# Patient Record
Sex: Male | Born: 1960
Health system: Southern US, Community
[De-identification: ages and names within clinical notes are randomized; demographics above are authoritative.]

## PROBLEM LIST (undated history)

## (undated) DIAGNOSIS — F419 Anxiety disorder, unspecified: Secondary | ICD-10-CM

## (undated) DIAGNOSIS — N200 Calculus of kidney: Secondary | ICD-10-CM

## (undated) DIAGNOSIS — K429 Umbilical hernia without obstruction or gangrene: Secondary | ICD-10-CM

## (undated) DIAGNOSIS — E039 Hypothyroidism, unspecified: Secondary | ICD-10-CM

## (undated) DIAGNOSIS — M1990 Unspecified osteoarthritis, unspecified site: Secondary | ICD-10-CM

## (undated) DIAGNOSIS — I10 Essential (primary) hypertension: Secondary | ICD-10-CM

## (undated) DIAGNOSIS — I2510 Atherosclerotic heart disease of native coronary artery without angina pectoris: Secondary | ICD-10-CM

## (undated) DIAGNOSIS — E785 Hyperlipidemia, unspecified: Secondary | ICD-10-CM

## (undated) HISTORY — DX: Hypothyroidism, unspecified: E03.9

## (undated) HISTORY — DX: Calculus of kidney: N20.0

## (undated) HISTORY — PX: CORONARY ANGIOPLASTY WITH STENT PLACEMENT: SHX49

## (undated) HISTORY — PX: TONSILLECTOMY: SUR1361

## (undated) HISTORY — PX: FINGER SURGERY: SHX640

## (undated) HISTORY — DX: Essential (primary) hypertension: I10

## (undated) HISTORY — DX: Anxiety disorder, unspecified: F41.9

## (undated) HISTORY — DX: Hyperlipidemia, unspecified: E78.5

## (undated) HISTORY — DX: Unspecified osteoarthritis, unspecified site: M19.90

## (undated) HISTORY — DX: Atherosclerotic heart disease of native coronary artery without angina pectoris: I25.10

---

## 2002-06-24 ENCOUNTER — Encounter: Payer: Self-pay | Admitting: Emergency Medicine

## 2002-06-24 ENCOUNTER — Emergency Department (HOSPITAL_COMMUNITY): Admission: EM | Admit: 2002-06-24 | Discharge: 2002-06-24 | Payer: Self-pay | Admitting: Emergency Medicine

## 2005-02-12 ENCOUNTER — Ambulatory Visit: Payer: Self-pay | Admitting: Family Medicine

## 2005-08-15 ENCOUNTER — Ambulatory Visit: Payer: Self-pay | Admitting: Family Medicine

## 2005-09-06 ENCOUNTER — Ambulatory Visit: Payer: Self-pay | Admitting: Family Medicine

## 2005-12-11 ENCOUNTER — Ambulatory Visit: Payer: Self-pay | Admitting: Family Medicine

## 2005-12-21 ENCOUNTER — Ambulatory Visit: Payer: Self-pay | Admitting: Family Medicine

## 2006-03-29 ENCOUNTER — Ambulatory Visit: Payer: Self-pay | Admitting: Family Medicine

## 2006-04-06 ENCOUNTER — Emergency Department (HOSPITAL_COMMUNITY): Admission: EM | Admit: 2006-04-06 | Discharge: 2006-04-06 | Payer: Self-pay | Admitting: Emergency Medicine

## 2006-04-19 ENCOUNTER — Ambulatory Visit: Payer: Self-pay | Admitting: Family Medicine

## 2006-12-15 ENCOUNTER — Emergency Department (HOSPITAL_COMMUNITY): Admission: EM | Admit: 2006-12-15 | Discharge: 2006-12-16 | Payer: Self-pay | Admitting: Emergency Medicine

## 2007-04-16 ENCOUNTER — Ambulatory Visit: Payer: Self-pay | Admitting: Family Medicine

## 2007-04-16 LAB — CONVERTED CEMR LAB
Blood in Urine, dipstick: NEGATIVE
Ketones, urine, test strip: NEGATIVE
Nitrite: NEGATIVE
Specific Gravity, Urine: >=1.03
Urobilinogen, UA: NEGATIVE
WBC Urine, dipstick: NEGATIVE
pH: 5

## 2007-04-21 LAB — CONVERTED CEMR LAB
ALT: 76 units/L — ABNORMAL HIGH (ref 0–53)
AST: 57 units/L — ABNORMAL HIGH (ref 0–37)
Albumin: 3.8 g/dL (ref 3.5–5.2)
Alkaline Phosphatase: 78 units/L (ref 39–117)
BUN: 10 mg/dL (ref 6–23)
Basophils Absolute: 0 10*3/uL (ref 0.0–0.1)
Basophils Relative: 0.1 % (ref 0.0–1.0)
Bilirubin, Direct: 0.1 mg/dL (ref 0.0–0.3)
CO2: 28 meq/L (ref 19–32)
Calcium: 9.5 mg/dL (ref 8.4–10.5)
Chloride: 102 meq/L (ref 96–112)
Cholesterol: 259 mg/dL (ref 0–200)
Creatinine, Ser: 0.8 mg/dL (ref 0.4–1.5)
Direct LDL: 123.1 mg/dL
Eosinophils Absolute: 0.2 10*3/uL (ref 0.0–0.6)
Eosinophils Relative: 2.7 % (ref 0.0–5.0)
GFR calc Af Amer: 133 mL/min
GFR calc non Af Amer: 110 mL/min
Glucose, Bld: 245 mg/dL — ABNORMAL HIGH (ref 70–99)
HCT: 37.9 % — ABNORMAL LOW (ref 39.0–52.0)
HDL: 35.7 mg/dL — ABNORMAL LOW (ref >39.0)
Hemoglobin: 13.4 g/dL (ref 13.0–17.0)
Lymphocytes Relative: 12.2 % (ref 12.0–46.0)
MCHC: 35.2 g/dL (ref 30.0–36.0)
MCV: 91.3 fL (ref 78.0–100.0)
Monocytes Absolute: 0.6 10*3/uL (ref 0.2–0.7)
Monocytes Relative: 10.2 % (ref 3.0–11.0)
Neutro Abs: 4.2 10*3/uL (ref 1.4–7.7)
Neutrophils Relative %: 74.8 % (ref 43.0–77.0)
Platelets: 205 10*3/uL (ref 150–400)
Potassium: 3.6 meq/L (ref 3.5–5.1)
RBC: 4.16 M/uL — ABNORMAL LOW (ref 4.22–5.81)
RDW: 12.3 % (ref 11.5–14.6)
Sodium: 139 meq/L (ref 135–145)
TSH: 1.71 microintl units/mL (ref 0.35–5.50)
Total Bilirubin: 0.8 mg/dL (ref 0.3–1.2)
Total CHOL/HDL Ratio: 7.3
Total Protein: 7.4 g/dL (ref 6.0–8.3)
Triglycerides: 700 mg/dL (ref 0–149)
VLDL: 140 mg/dL — ABNORMAL HIGH (ref 0–40)
WBC: 5.7 10*3/uL (ref 4.5–10.5)

## 2007-05-27 ENCOUNTER — Ambulatory Visit: Payer: Self-pay | Admitting: Family Medicine

## 2007-05-27 DIAGNOSIS — M109 Gout, unspecified: Secondary | ICD-10-CM | POA: Insufficient documentation

## 2007-05-27 DIAGNOSIS — E039 Hypothyroidism, unspecified: Secondary | ICD-10-CM | POA: Insufficient documentation

## 2007-05-27 DIAGNOSIS — E785 Hyperlipidemia, unspecified: Secondary | ICD-10-CM | POA: Insufficient documentation

## 2007-05-27 DIAGNOSIS — M1990 Unspecified osteoarthritis, unspecified site: Secondary | ICD-10-CM | POA: Insufficient documentation

## 2007-05-27 DIAGNOSIS — I10 Essential (primary) hypertension: Secondary | ICD-10-CM | POA: Insufficient documentation

## 2007-05-27 DIAGNOSIS — Z87442 Personal history of urinary calculi: Secondary | ICD-10-CM | POA: Insufficient documentation

## 2007-05-28 LAB — CONVERTED CEMR LAB
Creatinine,U: 165.9 mg/dL
Hgb A1c MFr Bld: 8.8 % — ABNORMAL HIGH (ref 4.6–6.0)
Microalb Creat Ratio: 40.4 mg/g — ABNORMAL HIGH (ref 0.0–30.0)
Microalb, Ur: 6.7 mg/dL — ABNORMAL HIGH (ref 0.0–1.9)
Uric Acid, Serum: 6.7 mg/dL (ref 2.4–7.0)

## 2007-10-22 ENCOUNTER — Telehealth: Payer: Self-pay | Admitting: Family Medicine

## 2008-03-23 ENCOUNTER — Telehealth: Payer: Self-pay | Admitting: Family Medicine

## 2008-06-11 ENCOUNTER — Ambulatory Visit: Payer: Self-pay | Admitting: Family Medicine

## 2008-06-14 LAB — CONVERTED CEMR LAB
ALT: 48 units/L (ref 0–53)
AST: 37 units/L (ref 0–37)
Albumin: 4.1 g/dL (ref 3.5–5.2)
Alkaline Phosphatase: 66 units/L (ref 39–117)
BUN: 12 mg/dL (ref 6–23)
Basophils Absolute: 0 10*3/uL (ref 0.0–0.1)
Basophils Relative: 0.1 % (ref 0.0–3.0)
Bilirubin, Direct: 0.1 mg/dL (ref 0.0–0.3)
CO2: 32 meq/L (ref 19–32)
Calcium: 9.9 mg/dL (ref 8.4–10.5)
Chloride: 97 meq/L (ref 96–112)
Cholesterol: 295 mg/dL (ref 0–200)
Creatinine, Ser: 0.9 mg/dL (ref 0.4–1.5)
Creatinine,U: 67.9 mg/dL
Direct LDL: 151.1 mg/dL
Eosinophils Absolute: 0.2 10*3/uL (ref 0.0–0.7)
Eosinophils Relative: 2.7 % (ref 0.0–5.0)
GFR calc Af Amer: 116 mL/min
GFR calc non Af Amer: 96 mL/min
Glucose, Bld: 266 mg/dL — ABNORMAL HIGH (ref 70–99)
HCT: 40.4 % (ref 39.0–52.0)
HDL: 34.8 mg/dL — ABNORMAL LOW (ref >39.0)
Hemoglobin: 14.4 g/dL (ref 13.0–17.0)
Hgb A1c MFr Bld: 8.9 % — ABNORMAL HIGH (ref 4.6–6.0)
Lymphocytes Relative: 12.2 % (ref 12.0–46.0)
MCHC: 35.5 g/dL (ref 30.0–36.0)
MCV: 91.9 fL (ref 78.0–100.0)
Microalb Creat Ratio: 63.3 mg/g — ABNORMAL HIGH (ref 0.0–30.0)
Microalb, Ur: 4.3 mg/dL — ABNORMAL HIGH (ref 0.0–1.9)
Monocytes Absolute: 0.7 10*3/uL (ref 0.1–1.0)
Monocytes Relative: 12.1 % — ABNORMAL HIGH (ref 3.0–12.0)
Neutro Abs: 4.5 10*3/uL (ref 1.4–7.7)
Neutrophils Relative %: 72.9 % (ref 43.0–77.0)
Platelets: 230 10*3/uL (ref 150–400)
Potassium: 3.5 meq/L (ref 3.5–5.1)
RBC: 4.4 M/uL (ref 4.22–5.81)
RDW: 12.3 % (ref 11.5–14.6)
Sodium: 140 meq/L (ref 135–145)
TSH: 2.59 microintl units/mL (ref 0.35–5.50)
Total Bilirubin: 0.8 mg/dL (ref 0.3–1.2)
Total CHOL/HDL Ratio: 8.5
Total Protein: 7.6 g/dL (ref 6.0–8.3)
Triglycerides: 536 mg/dL (ref 0–149)
Uric Acid, Serum: 5.8 mg/dL (ref 4.0–7.8)
VLDL: 107 mg/dL — ABNORMAL HIGH (ref 0–40)
WBC: 6.1 10*3/uL (ref 4.5–10.5)

## 2008-09-06 ENCOUNTER — Telehealth: Payer: Self-pay | Admitting: Family Medicine

## 2008-09-13 ENCOUNTER — Ambulatory Visit: Payer: Self-pay | Admitting: Family Medicine

## 2008-10-18 ENCOUNTER — Telehealth: Payer: Self-pay | Admitting: Family Medicine

## 2008-10-26 ENCOUNTER — Ambulatory Visit: Payer: Self-pay | Admitting: Cardiology

## 2008-10-27 ENCOUNTER — Inpatient Hospital Stay (HOSPITAL_COMMUNITY): Admission: EM | Admit: 2008-10-27 | Discharge: 2008-10-30 | Payer: Self-pay | Admitting: Emergency Medicine

## 2008-11-11 ENCOUNTER — Encounter (HOSPITAL_COMMUNITY): Admission: RE | Admit: 2008-11-11 | Discharge: 2008-11-26 | Payer: Self-pay | Admitting: Cardiovascular Disease

## 2008-11-15 ENCOUNTER — Ambulatory Visit: Payer: Self-pay | Admitting: Cardiology

## 2008-12-13 ENCOUNTER — Encounter: Payer: Self-pay | Admitting: Cardiology

## 2008-12-13 ENCOUNTER — Ambulatory Visit: Payer: Self-pay | Admitting: Cardiovascular Disease

## 2008-12-13 LAB — CONVERTED CEMR LAB
Cholesterol, target level: 200 mg/dL
HDL goal, serum: 40 mg/dL
LDL Goal: 70 mg/dL

## 2008-12-14 LAB — CONVERTED CEMR LAB
Cholesterol: 152 mg/dL (ref 0–200)
Direct LDL: 78.9 mg/dL
HDL: 27.5 mg/dL — ABNORMAL LOW (ref >39.00)
Total CHOL/HDL Ratio: 6
Triglycerides: 297 mg/dL — ABNORMAL HIGH (ref 0.0–149.0)
VLDL: 59.4 mg/dL — ABNORMAL HIGH (ref 0.0–40.0)

## 2008-12-16 ENCOUNTER — Encounter: Payer: Self-pay | Admitting: Cardiology

## 2008-12-25 DIAGNOSIS — I2510 Atherosclerotic heart disease of native coronary artery without angina pectoris: Secondary | ICD-10-CM | POA: Insufficient documentation

## 2008-12-25 DIAGNOSIS — F411 Generalized anxiety disorder: Secondary | ICD-10-CM | POA: Insufficient documentation

## 2008-12-25 DIAGNOSIS — R079 Chest pain, unspecified: Secondary | ICD-10-CM

## 2009-02-02 ENCOUNTER — Encounter (INDEPENDENT_AMBULATORY_CARE_PROVIDER_SITE_OTHER): Payer: Self-pay | Admitting: *Deleted

## 2009-02-08 ENCOUNTER — Telehealth: Payer: Self-pay | Admitting: Family Medicine

## 2009-07-13 ENCOUNTER — Ambulatory Visit: Payer: Self-pay | Admitting: Cardiology

## 2009-07-13 ENCOUNTER — Encounter: Payer: Self-pay | Admitting: Cardiology

## 2009-07-27 ENCOUNTER — Telehealth: Payer: Self-pay | Admitting: Family Medicine

## 2009-08-04 ENCOUNTER — Telehealth (INDEPENDENT_AMBULATORY_CARE_PROVIDER_SITE_OTHER): Payer: Self-pay

## 2009-08-08 ENCOUNTER — Ambulatory Visit: Payer: Self-pay | Admitting: Cardiology

## 2009-08-08 ENCOUNTER — Encounter (HOSPITAL_COMMUNITY): Admission: RE | Admit: 2009-08-08 | Discharge: 2009-08-24 | Payer: Self-pay | Admitting: Cardiology

## 2009-08-08 ENCOUNTER — Ambulatory Visit: Payer: Self-pay

## 2009-08-08 ENCOUNTER — Encounter: Payer: Self-pay | Admitting: Cardiology

## 2010-01-16 ENCOUNTER — Telehealth: Payer: Self-pay | Admitting: Family Medicine

## 2010-01-18 ENCOUNTER — Encounter: Payer: Self-pay | Admitting: Cardiology

## 2010-01-18 ENCOUNTER — Ambulatory Visit: Payer: Self-pay | Admitting: Cardiology

## 2010-02-20 ENCOUNTER — Encounter: Payer: Self-pay | Admitting: Cardiology

## 2010-07-11 ENCOUNTER — Telehealth: Payer: Self-pay | Admitting: Family Medicine

## 2010-08-15 ENCOUNTER — Telehealth: Payer: Self-pay | Admitting: Family Medicine

## 2010-08-18 ENCOUNTER — Telehealth: Payer: Self-pay | Admitting: Family Medicine

## 2010-08-29 ENCOUNTER — Telehealth: Payer: Self-pay | Admitting: Cardiology

## 2010-08-30 ENCOUNTER — Ambulatory Visit
Admission: RE | Admit: 2010-08-30 | Discharge: 2010-08-30 | Payer: Self-pay | Source: Home / Self Care | Attending: Family Medicine | Admitting: Family Medicine

## 2010-09-11 ENCOUNTER — Telehealth: Payer: Self-pay | Admitting: Family Medicine

## 2010-09-24 LAB — CONVERTED CEMR LAB
ALT: 66 units/L — ABNORMAL HIGH (ref 0–53)
AST: 50 units/L — ABNORMAL HIGH (ref 0–37)
Albumin: 4.2 g/dL (ref 3.5–5.2)
Albumin: 4.4 g/dL (ref 3.5–5.2)
Alkaline Phosphatase: 69 units/L (ref 39–117)
BUN: 13 mg/dL (ref 6–23)
BUN: 9 mg/dL (ref 6–23)
Basophils Absolute: 0 10*3/uL (ref 0.0–0.1)
Basophils Relative: 0 % (ref 0.0–3.0)
Bilirubin, Direct: 0.1 mg/dL (ref 0.0–0.3)
CO2: 29 meq/L (ref 19–32)
Calcium: 10.4 mg/dL (ref 8.4–10.5)
Chloride: 97 meq/L (ref 96–112)
Cholesterol: 205 mg/dL — ABNORMAL HIGH (ref 0–200)
Cholesterol: 226 mg/dL (ref 0–200)
Creatinine, Ser: 1 mg/dL (ref 0.4–1.5)
Creatinine, Ser: 1 mg/dL (ref 0.4–1.5)
Creatinine,U: 68.8 mg/dL
Direct LDL: 127.7 mg/dL
Direct LDL: 137.8 mg/dL
Eosinophils Absolute: 0.1 10*3/uL (ref 0.0–0.7)
Eosinophils Relative: 1.3 % (ref 0.0–5.0)
GFR calc Af Amer: 103 mL/min
GFR calc non Af Amer: 84.18 mL/min (ref >60)
GFR calc non Af Amer: 85 mL/min
Glucose, Bld: 211 mg/dL — ABNORMAL HIGH (ref 70–99)
HCT: 42 % (ref 39.0–52.0)
HDL: 39.5 mg/dL (ref >39.00)
HDL: 44.7 mg/dL (ref >39.0)
Hemoglobin: 14.6 g/dL (ref 13.0–17.0)
Hgb A1c MFr Bld: 8.4 % — ABNORMAL HIGH (ref 4.6–6.0)
Lymphocytes Relative: 9.9 % — ABNORMAL LOW (ref 12.0–46.0)
MCHC: 34.7 g/dL (ref 30.0–36.0)
MCV: 92.4 fL (ref 78.0–100.0)
Microalb Creat Ratio: 95.9 mg/g — ABNORMAL HIGH (ref 0.0–30.0)
Microalb, Ur: 6.6 mg/dL — ABNORMAL HIGH (ref 0.0–1.9)
Monocytes Absolute: 0.6 10*3/uL (ref 0.1–1.0)
Monocytes Relative: 9.3 % (ref 3.0–12.0)
Neutro Abs: 6 10*3/uL (ref 1.4–7.7)
Neutrophils Relative %: 79.5 % — ABNORMAL HIGH (ref 43.0–77.0)
Platelets: 237 10*3/uL (ref 150–400)
Potassium: 3.9 meq/L (ref 3.5–5.1)
RBC: 4.55 M/uL (ref 4.22–5.81)
RDW: 11.9 % (ref 11.5–14.6)
Sodium: 136 meq/L (ref 135–145)
TSH: 1.44 microintl units/mL (ref 0.35–5.50)
Total Bilirubin: 1.1 mg/dL (ref 0.3–1.2)
Total CHOL/HDL Ratio: 5.1
Total Protein: 7.9 g/dL (ref 6.0–8.3)
Triglycerides: 339 mg/dL (ref 0–149)
VLDL: 56.4 mg/dL — ABNORMAL HIGH (ref 0.0–40.0)
VLDL: 68 mg/dL — ABNORMAL HIGH (ref 0–40)
WBC: 7.5 10*3/uL (ref 4.5–10.5)

## 2010-09-28 NOTE — Progress Notes (Signed)
Summary: med refill  Phone Note Refill Request Message from:  Patient  Refills Requested: Medication #1:  ZOLPIDEM TARTRATE 10 MG  TABS 1 by mouth at bedtime pt is completely out of med. Pt call refill in today  Initial call taken by: Heron Sabins,  August 18, 2010 11:43 AM  Follow-up for Phone Call        #30. keep f/u appt Follow-up by: Edwyna Perfect MD,  August 18, 2010 1:48 PM  Additional Follow-up for Phone Call Additional follow up Details #1::        pt aware  [facility]  Additional Follow-up by: Pura Spice, RN,  August 18, 2010 2:00 PM    New/Updated Medications: ZOLPIDEM TARTRATE 10 MG  TABS (ZOLPIDEM TARTRATE) 1 by mouth at bedtime Prescriptions: ZOLPIDEM TARTRATE 10 MG  TABS (ZOLPIDEM TARTRATE) 1 by mouth at bedtime  #30 x 0   Entered by:   Pura Spice, RN   Authorized by:   Edwyna Perfect MD   Signed by:   Pura Spice, RN on 08/18/2010   Method used:   Telephoned to ...       [facility] [address] (retail)       [address]       [place], [place]  [address]       Ph: [phone] or [phone]       Fax: [phone]   RxID:   [id number]

## 2010-09-28 NOTE — Progress Notes (Signed)
Summary: refill Zolpidem  Phone Note Refill Request Message from:  Fax from Pharmacy on Jan 16, 2010 10:24 AM  Refills Requested: Medication #1:  ZOLPIDEM TARTRATE 10 MG  TABS 1 by mouth at bedtime   Dosage confirmed as above?Dosage Confirmed   Supply Requested: 1 month   Last Refilled: 08/25/2009  Method Requested: Fax to Local Pharmacy Initial call taken by: Raechel Ache, RN,  Jan 16, 2010 10:24 AM [facility]er: [name] [phone]*  Follow-up for Phone Call        call in #30 with 5 rf Follow-up by: [name] MD,  Jan 16, 2010 3:51 PM  Additional Follow-up for Phone Call Additional follow up Details #1::        Rx faxed to pharmacy Additional Follow-up by: Raechel Ache, RN,  Jan 16, 2010 4:07 PM    Prescriptions: ZOLPIDEM TARTRATE 10 MG  TABS (ZOLPIDEM TARTRATE) 1 by mouth at bedtime  #30 x 5   Entered by:   Raechel Ache, RN   Authorized by:   [name] MD   Signed by:   Raechel Ache, RN on 01/16/2010   Method used:   Historical   RxID:   [id number]

## 2010-09-28 NOTE — Progress Notes (Signed)
Summary: rx zolpidem   ---- Converted from flag ---- ---- 07/11/2010 2:43 PM, Alfred Levins, CMA wrote:   ---- 07/11/2010 2:37 PM, Judithe Modest CMA wrote: pt pharmacy requesting refill on pts  Zolpidem.  [facility] [address]  Marchelle Folks ------------------------------  last ov was 2009 with Dr Clent Ridges .gh rn  Appended Document: rx zolpidem  no refills until he sees me   Appended Document: rx zolpidem  [facility].

## 2010-09-28 NOTE — Progress Notes (Signed)
Summary: rx zolpidem tartrate  Phone Note From Pharmacy   [name] Call For: [name] MD  Details for Reason: refill  Summary of Call: refill zolpidem tartrate 10mg . last refill date 08/18/2010 Initial call taken by: Kyung Rudd, CMA,  September 11, 2010 4:53 PM  Follow-up for Phone Call        call in #30 with 5 rf Follow-up by: [name] MD,  September 12, 2010 9:07 AM  Additional Follow-up for Phone Call Additional follow up Details #1::        done Additional Follow-up by: Pura Spice, RN,  September 12, 2010 11:38 AM    Prescriptions: ZOLPIDEM TARTRATE 10 MG  TABS (ZOLPIDEM TARTRATE) 1 by mouth at bedtime  #30 x 5   Entered by:   Pura Spice, RN   Authorized by:   [name] MD   Signed by:   Pura Spice, RN on 09/12/2010   Method used:   Telephoned to ...       [facility] [address] (retail)       [address]       [place], [place]  [address]       Ph: [phone] or [phone]       Fax: [phone]   RxID:   [id number]

## 2010-09-28 NOTE — Assessment & Plan Note (Signed)
Summary: med check and refill/cjr   Vital Signs:  Patient profile:   50 year old male Weight:      239 pounds O2 Sat:      98 % Temp:     98.2 degrees F Pulse rate:   75 / minute BP sitting:   160 / 90  (left arm) Cuff size:   large  Vitals Entered By: Pura Spice, RN (August 30, 2010 10:56 AM) CC: Med ck with refills and BP ck. New glucometer given (freestyle freedom lite)   History of Present Illness: Here to check his BP and to discuss anxiety. He saw Dr. [name] a few months ago, and he seemed to be doing well from a cardiac standpoint. However his BP remains high, and he attributes this to stress. His job is very stressful, and he has dealt with the death of several close family memebers lately, including his brother. he finds himself worrying about things, can't relax, can't sleep, etc. He has some sadness feelings also, but anxiety sems to be the main problem. He has tried a few of his brother's Xanax, and these were veyr successful at helping him feel better.   Allergies (verified): No Known Drug Allergies  Past History:  Past Medical History: Reviewed history from 01/18/2010 and no changes required. HYPERLIPIDEMIA (ICD-272.4) HYPERTENSION (ICD-401.9) CAD (ICD-414.00) DEGENERATIVE JOINT DISEASE (ICD-715.90) HYPOTHYROIDISM (ICD-244.9) GOUT (ICD-274.9) ANXIETY (ICD-300.00) OSTEOARTHRITIS (ICD-715.90) DIABETES MELLITUS, TYPE II (ICD-250.00) RENAL CALCULUS, HX OF (ICD-V13.01)  Past Surgical History: cardiac cath with stent placements  Review of Systems  The patient denies anorexia, fever, weight loss, weight gain, vision loss, decreased hearing, hoarseness, chest pain, syncope, dyspnea on exertion, peripheral edema, prolonged cough, headaches, hemoptysis, abdominal pain, melena, hematochezia, severe indigestion/heartburn, hematuria, incontinence, genital sores, muscle weakness, suspicious skin lesions, transient blindness, difficulty walking, unusual weight change,  abnormal bleeding, enlarged lymph nodes, angioedema, breast masses, and testicular masses.    Physical Exam  General:  Well-developed,well-nourished,in no acute distress; alert,appropriate and cooperative throughout examination Neck:  No deformities, masses, or tenderness noted. Lungs:  Normal respiratory effort, chest expands symmetrically. Lungs are clear to auscultation, no crackles or wheezes. Heart:  Normal rate and regular rhythm. S1 and S2 normal without gallop, murmur, click, rub or other extra sounds. Psych:  Oriented X3, memory intact for recent and remote, normally interactive, good eye contact, and moderately anxious.     Impression & Recommendations:  Problem # 1:  ANXIETY (ICD-300.00)  His updated medication list for this problem includes:    Alprazolam 0.5 Mg Tabs (Alprazolam) .[name]... Three times a day as needed for anxiety  Problem # 2:  HYPERTENSION (ICD-401.9)  His updated medication list for this problem includes:    Lisinopril-hydrochlorothiazide 20-12.5 Mg Tabs (Lisinopril-hydrochlorothiazide) .[name]..[name] Two times a day    Amlodipine Besylate 10 Mg Tabs (Amlodipine besylate) ..... Once daily    Clonidine Hcl 0.3 Mg Tabs (Clonidine hcl) .[name]... Take one tablet by mouth twice a day    Metoprolol Succinate 100 Mg Xr24h-tab (Metoprolol succinate) .[name]... Take 1 tablet by mouth two times a day  Problem # 3:  CAD (ICD-414.00)  His updated medication list for this problem includes:    Lisinopril-hydrochlorothiazide 20-12.5 Mg Tabs (Lisinopril-hydrochlorothiazide) .[name]..[name] Two times a day    Amlodipine Besylate 10 Mg Tabs (Amlodipine besylate) ..... Once daily    Bayer Aspirin 325 Mg Tabs (Aspirin) ..... Once daily    Clonidine Hcl 0.3 Mg Tabs (Clonidine hcl) .[name]... Take one tablet by mouth twice  a day    Plavix 75 Mg Tabs (Clopidogrel bisulfate) .[name]... Take 1 tablet daily    Nitroglycerin 0.4 Mg Subl (Nitroglycerin) ..... One tablet under tongue every 5 minutes as needed for chest  pain---may repeat times three    Metoprolol Succinate 100 Mg Xr24h-tab (Metoprolol succinate) .[name]... Take 1 tablet by mouth two times a day  Complete Medication List: 1)  Crestor 40 Mg Tabs (Rosuvastatin calcium) .[name].. 1 by mouth once daily 2)  Indomethacin Cr 75 Mg Cpcr (Indomethacin) .... As needed 3)  Levothyroxine Sodium 112 Mcg Tabs (Levothyroxine sodium) .[name].. 1 by mouth once daily 4)  Lisinopril-hydrochlorothiazide 20-12.5 Mg Tabs (Lisinopril-hydrochlorothiazide) .... Two times a day 5)  Metformin Hcl 1000 Mg Tabs (Metformin hcl) .... Take 1 tablet by mouth two times a day must be seen by dr Josseline Reddin prior to refills 6)  Zolpidem Tartrate 10 Mg Tabs (Zolpidem tartrate) .[name].. 1 by mouth at bedtime 7)  Amlodipine Besylate 10 Mg Tabs (Amlodipine besylate) .... Once daily 8)  Fenofibrate 160 Mg Tabs (Fenofibrate) .... Once daily 9)  Bayer Aspirin 325 Mg Tabs (Aspirin) .... Once daily 10)  Clonidine Hcl 0.3 Mg Tabs (Clonidine hcl) .... Take one tablet by mouth twice a day 11)  Zetia 10 Mg Tabs (Ezetimibe) .... Take 1 tablet daily 12)  Plavix 75 Mg Tabs (Clopidogrel bisulfate) .... Take 1 tablet daily 13)  Nitroglycerin 0.4 Mg Subl (Nitroglycerin) .... One tablet under tongue every 5 minutes as needed for chest pain---may repeat times three 14)  Metoprolol Succinate 100 Mg Xr24h-tab (Metoprolol succinate) .... Take 1 tablet by mouth two times a day 15)  Freestyle Test Strp (Glucose blood) .... Test as directed 16)  Freestyle Lancets Misc (Lancets) .... Test as directed 17)  Alprazolam 0.5 Mg Tabs (Alprazolam) .... Three times a day as needed for anxiety 18)  Vicodin 5-500 Mg Tabs (Hydrocodone-acetaminophen) .[name].. 1 q 6 hours as needed pain  Patient Instructions: 1)  try Xanax as needed, and recheck in one month Prescriptions: VICODIN 5-500 MG TABS (HYDROCODONE-ACETAMINOPHEN) 1 q 6 hours as needed pain  #60 x 0   Entered and Authorized by:   [name] MD   Signed by:   [name] MD on  08/30/2010   Method used:   Print then Give to Patient   RxID:   [id number] ALPRAZOLAM 0.5 MG TABS (ALPRAZOLAM) three times a day as needed for anxiety  #60 x 2   Entered and Authorized by:   [name] MD   Signed by:   [name] MD on 08/30/2010   Method used:   Print then Give to Patient   RxID:   [id number] FREESTYLE LANCETS  MISC (LANCETS) test as directed  #100 x 1   Entered by:   Pura Spice, RN   Authorized by:   [name] MD   Signed by:   Pura Spice, RN on 08/30/2010   Method used:   Electronically to        [facility] [phone][facility] (retail)       [address]       [place], [place]  [address]       Ph: [phone] or [phone]       Fax: [phone]   RxID:   [id number] FREESTYLE TEST  STRP (GLUCOSE BLOOD) test as directed  #100 x 1   Entered by:   Pura Spice, RN   Authorized by:   Tera Mater  Clent Ridges MD   Signed by:   Pura Spice, RN on 08/30/2010   Method used:   Electronically to        [facility] [phone]* (retail)       [address]       [place], [place]  [address]       Ph: [phone] or [phone]       Fax: [phone]   RxID:   [id number]    Orders Added: 1)  Est. Patient Level IV [[address]]

## 2010-09-28 NOTE — Progress Notes (Signed)
Summary: pt having discomfort in left arm  Phone Note Call from Patient   Caller: Patient [phone] Reason for Call: Talk to Nurse Summary of Call: pt having a "discomfort" not pain in his left arm for 3-4 days, denies any other symptoms Initial call taken by: Glynda Jaeger,  August 29, 2010 4:27 PM  Follow-up for Phone Call        Left message to call back Deliah Goody, RN  August 29, 2010 5:29 PM  Left message to call back Deliah Goody, RN  August 31, 2010 4:58 PM  pt never returned my call Deliah Goody, RN  September 04, 2010 2:13 PM

## 2010-09-28 NOTE — Progress Notes (Signed)
Summary: Pt sch ov for 08/30/10. Req refill of Amlodipine to [facility] Note Refill Request Call back at [facility] Phone [phone] Message from:  Patient  Refills Requested: Medication #1:  AMLODIPINE BESYLATE 10 MG  TABS once daily   Dosage confirmed as above?Dosage Confirmed Pt called and has sch an ov for 08/30/10. Pt req 1 refill to last until his appt date. [facility]s call [facility] [address]    Method Requested: Telephone to Pharmacy Initial call taken by: Lucy Antigua,  August 15, 2010 1:21 PM  Follow-up for Phone Call        done  pt aware. Follow-up by: Pura Spice, RN,  August 15, 2010 3:42 PM    New/Updated Medications: AMLODIPINE BESYLATE 10 MG  TABS (AMLODIPINE BESYLATE) once daily Prescriptions: AMLODIPINE BESYLATE 10 MG  TABS (AMLODIPINE BESYLATE) once daily  #30 x 2   Entered by:   Pura Spice, RN   Authorized by:   [name] MD   Signed by:   Pura Spice, RN on 08/15/2010   Method used:   Electronically to        [facility] [phone]* (retail)       [address]       [place], [place]  [address]       Ph: [phone] or [phone]       Fax: [phone]   RxID:   [id number]

## 2010-09-28 NOTE — Assessment & Plan Note (Signed)
Summary: [facility]   Visit Type:  6 months follow up  CC:  No cardiac complains.  History of Present Illness: [name] is a  gentleman with a history of coronary artery disease,  hypertension, diabetes, and hyperlipidemia.  He had a drug-eluting stent to his circumflex and a drug-eluting stent to his right coronary artery in March of 2010. His LV function is normal. Myoview in December 2010 showed an ejection fraction of 57% and normal perfusion. Abdominal ultrasound in December 2010 showed no aneurysm and no renal artery stenosis. I last saw him in November 2010. Since then she denies any dyspnea,  orthopnea, PND, pedal edema, syncope or exertional chest pain. He occasionally feels a "twinge" in his chest that lasts seconds.  Current Medications (verified): 1)  Crestor 40 Mg Tabs (Rosuvastatin Calcium) .[name].. 1 By Mouth Once Daily 2)  Indomethacin Cr 75 Mg Cpcr (Indomethacin) .... As Needed 3)  Levothyroxine Sodium 112 Mcg Tabs (Levothyroxine Sodium) .[name].. 1 By Mouth Once Daily 4)  Lisinopril-Hydrochlorothiazide 20-12.5 Mg Tabs (Lisinopril-Hydrochlorothiazide) .... Two Times A Day 5)  Metformin Hcl 1000 Mg Tabs (Metformin Hcl) .... Take 1 Tablet By Mouth Two Times A Day 6)  Zolpidem Tartrate 10 Mg  Tabs (Zolpidem Tartrate) .[name].. 1 By Mouth At Bedtime 7)  Amlodipine Besylate 10 Mg  Tabs (Amlodipine Besylate) .... Once Daily 8)  Fenofibrate 160 Mg Tabs (Fenofibrate) .... Once Daily 9)  Bayer Aspirin 325 Mg Tabs (Aspirin) .... Once Daily 10)  Clonidine Hcl 0.2 Mg Tabs (Clonidine Hcl) .... Take One Tablet By Mouth Twice A Day 11)  Onetouch Ultra Test  Strp (Glucose Blood) .... Once Daily 12)  Zetia 10 Mg Tabs (Ezetimibe) .... Take 1 Tablet Daily 13)  Plavix 75 Mg Tabs (Clopidogrel Bisulfate) .... Take 1 Tablet Daily 14)  Nitroglycerin 0.4 Mg Subl (Nitroglycerin) .... One Tablet Under Tongue Every 5 Minutes As Needed For Chest Pain---May Repeat Times Three 15)  Metoprolol Succinate  100 Mg Xr24h-Tab (Metoprolol Succinate) .... Take 1 Tablet By Mouth Two Times A Day  Allergies (verified): No Known Drug Allergies  Past History:  Past Medical History: HYPERLIPIDEMIA (ICD-272.4) HYPERTENSION (ICD-401.9) CAD (ICD-414.00) DEGENERATIVE JOINT DISEASE (ICD-715.90) HYPOTHYROIDISM (ICD-244.9) GOUT (ICD-274.9) ANXIETY (ICD-300.00) OSTEOARTHRITIS (ICD-715.90) DIABETES MELLITUS, TYPE II (ICD-250.00) RENAL CALCULUS, HX OF (ICD-V13.01)  Past Surgical History: Reviewed history from 05/27/2007 and no changes required. Denies surgical history  Social History: Reviewed history from 12/25/2008 and no changes required. Occupation: Psychologist, clinical Married Never Smoked, Uses smokeless tobacco Alcohol use-yes- occassional red wine and beer  Review of Systems       no fevers or chills, productive cough, hemoptysis, dysphasia, odynophagia, melena, hematochezia, dysuria, hematuria, rash, seizure activity, orthopnea, PND, pedal edema, claudication. Remaining systems are negative.   Vital Signs:  Patient profile:   50 year old male Height:      73 inches Weight:      243 pounds BMI:     32.18 Pulse rate:   92 / minute Pulse rhythm:   regular Resp:     18 per minute BP sitting:   160 / 100  (left arm) Cuff size:   large  Vitals Entered By: Vikki Ports (Jan 18, 2010 9:17 AM)  Physical Exam  General:  Well-developed well-nourished in no acute distress.  Skin is warm and dry.  HEENT is normal.  Neck is supple. No thyromegaly.  Chest is clear to auscultation with normal expansion.  Cardiovascular exam is regular rate and rhythm.  Abdominal exam  nontender or distended. No masses palpated. Extremities show no edema. neuro grossly intact    EKG  Procedure date:  01/18/2010  Findings:      Normal sinus rhythm at a rate of 90. Left ventricular hypertrophy. Minor nonspecific ST changes.  Impression & Recommendations:  Problem # 1:  HYPERLIPIDEMIA  (ICD-272.4)  Continue present medications. Check lipids and liver. His updated medication list for this problem includes:    Crestor 40 Mg Tabs (Rosuvastatin calcium) .[name]... 1 by mouth once daily    Fenofibrate 160 Mg Tabs (Fenofibrate) ..... Once daily    Zetia 10 Mg Tabs (Ezetimibe) .[name]... Take 1 tablet daily  His updated medication list for this problem includes:    Crestor 40 Mg Tabs (Rosuvastatin calcium) .[name]... 1 by mouth once daily    Fenofibrate 160 Mg Tabs (Fenofibrate) ..... Once daily    Zetia 10 Mg Tabs (Ezetimibe) .[name]... Take 1 tablet daily  Orders: T-Hepatic Function [phone]) T-Lipid Profile [phone])  Problem # 2:  HYPERTENSION (ICD-401.9) Blood pressure elevated. Previous renal Dopplers normal. Increase clonidine to 0.3 mg p.o. b.i.d. Patient will check his blood pressure at home and contact us if it remains elevated we will increase clonidine further. Patient given education on lifestyle modification. His updated medication list for this problem includes:    Lisinopril-hydrochlorothiazide 20-12.5 Mg Tabs (Lisinopril-hydrochlorothiazide) .[name]..[name] Two times a day    Amlodipine Besylate 10 Mg Tabs (Amlodipine besylate) ..... Once daily    Bayer Aspirin 325 Mg Tabs (Aspirin) ..... Once daily    Clonidine Hcl 0.2 Mg Tabs (Clonidine hcl) .[name]... Take one tablet by mouth twice a day    Metoprolol Succinate 100 Mg Xr24h-tab (Metoprolol succinate) .[name]... Take 1 tablet by mouth two times a day  Problem # 3:  CAD (ICD-414.00) Continue aspirin, ACE inhibitor, beta blocker and statin. His updated medication list for this problem includes:    Lisinopril-hydrochlorothiazide 20-12.5 Mg Tabs (Lisinopril-hydrochlorothiazide) .[name]..[name] Two times a day    Amlodipine Besylate 10 Mg Tabs (Amlodipine besylate) ..... Once daily    Bayer Aspirin 325 Mg Tabs (Aspirin) ..... Once daily    Plavix 75 Mg Tabs (Clopidogrel bisulfate) .[name]... Take 1 tablet daily    Nitroglycerin 0.4 Mg Subl  (Nitroglycerin) ..... One tablet under tongue every 5 minutes as needed for chest pain---may repeat times three    Metoprolol Succinate 100 Mg Xr24h-tab (Metoprolol succinate) .[name]... Take 1 tablet by mouth two times a day  His updated medication list for this problem includes:    Lisinopril-hydrochlorothiazide 20-12.5 Mg Tabs (Lisinopril-hydrochlorothiazide) .[name]..[name] Two times a day    Amlodipine Besylate 10 Mg Tabs (Amlodipine besylate) ..... Once daily    Bayer Aspirin 325 Mg Tabs (Aspirin) ..... Once daily    Plavix 75 Mg Tabs (Clopidogrel bisulfate) .[name]... Take 1 tablet daily    Nitroglycerin 0.4 Mg Subl (Nitroglycerin) ..... One tablet under tongue every 5 minutes as needed for chest pain---may repeat times three    Metoprolol Succinate 100 Mg Xr24h-tab (Metoprolol succinate) .[name]... Take 1 tablet by mouth two times a day  Problem # 4:  HYPOTHYROIDISM (ICD-244.9)  His updated medication list for this problem includes:    Levothyroxine Sodium 112 Mcg Tabs (Levothyroxine sodium) .[name]... 1 by mouth once daily  His updated medication list for this problem includes:    Levothyroxine Sodium 112 Mcg Tabs (Levothyroxine sodium) .[name]... 1 by mouth once daily  Problem # 5:  DIABETES MELLITUS, TYPE II (ICD-250.00)  Management per primary care. His updated medication list  for this problem includes:    Lisinopril-hydrochlorothiazide 20-12.5 Mg Tabs (Lisinopril-hydrochlorothiazide) .[name]..[name] Two times a day    Metformin Hcl 1000 Mg Tabs (Metformin hcl) .[name]... Take 1 tablet by mouth two times a day    Bayer Aspirin 325 Mg Tabs (Aspirin) ..... Once daily  His updated medication list for this problem includes:    Lisinopril-hydrochlorothiazide 20-12.5 Mg Tabs (Lisinopril-hydrochlorothiazide) .[name]..[name] Two times a day    Metformin Hcl 1000 Mg Tabs (Metformin hcl) .[name]... Take 1 tablet by mouth two times a day    Bayer Aspirin 325 Mg Tabs (Aspirin) ..... Once daily  Problem # 6:  ANXIETY (ICD-300.00)  Other  Orders: T-Basic Metabolic Panel [phone])  Patient Instructions: 1)  Your physician recommends that you schedule a follow-up appointment in: 9 MONTHS 2)  Your physician has recommended you make the following change in your medication: INCREASE CLONIDINE 0.3MG  ONE TABLET TWICE DAILY Prescriptions: ZOLPIDEM TARTRATE 10 MG  TABS (ZOLPIDEM TARTRATE) 1 by mouth at bedtime  #30 x 0   Entered by:   Deliah Goody, RN   Authorized by:   Ferman Hamming, MD, [facility]   Signed by:   Deliah Goody, RN on 01/18/2010   Method used:   Print then Give to Patient   RxID:   [id number] CLONIDINE HCL 0.3 MG TABS (CLONIDINE HCL) Take one tablet by mouth twice a day  #60 x 12   Entered by:   Deliah Goody, RN   Authorized by:   Ferman Hamming, MD, [facility]   Signed by:   Deliah Goody, RN on 01/18/2010   Method used:   Electronically to        [facility] [phone][facility] (retail)       [address]       [place], [place]  [address]       Ph: [phone] or [phone]       Fax: [phone]   RxID:   [id number]

## 2010-10-04 ENCOUNTER — Other Ambulatory Visit: Payer: Self-pay

## 2010-10-04 DIAGNOSIS — R52 Pain, unspecified: Secondary | ICD-10-CM

## 2010-10-04 NOTE — Telephone Encounter (Signed)
Call in a 6 month supply  

## 2010-10-05 MED ORDER — HYDROCODONE-ACETAMINOPHEN 5-500 MG PO TABS: 1.0000 | ORAL_TABLET | Freq: Four times a day (QID) | ORAL | Status: AC | PRN

## 2010-10-05 NOTE — Telephone Encounter (Signed)
[facility]ed [facility]  For 6 months

## 2010-11-14 ENCOUNTER — Other Ambulatory Visit: Payer: Self-pay

## 2010-11-14 MED ORDER — ALPRAZOLAM 0.5 MG PO TABS
0.5000 mg | ORAL_TABLET | Freq: Three times a day (TID) | ORAL | Status: DC | PRN
Start: 2010-11-14 — End: 2011-02-02

## 2010-11-14 NOTE — Telephone Encounter (Signed)
Alprazolam refill ok'd by Dr Scotty Court.

## 2010-11-15 ENCOUNTER — Telehealth: Payer: Self-pay | Admitting: Family Medicine

## 2010-11-15 NOTE — Telephone Encounter (Signed)
[facility] spoke to cliff and no record of xanax from yest that was ok'd by Dr Scotty Court. Rx given again. Pt aware.

## 2010-11-15 NOTE — Telephone Encounter (Signed)
Pt called back and said that his rx was not at the pharmacy and was told that it had been done yesterday. [facility]. He wants his nurse to call him when done. Needs today.

## 2010-11-17 ENCOUNTER — Encounter: Payer: Self-pay | Admitting: Family Medicine

## 2010-11-21 ENCOUNTER — Ambulatory Visit: Payer: Self-pay | Admitting: Family Medicine

## 2010-11-27 ENCOUNTER — Other Ambulatory Visit: Payer: Self-pay | Admitting: Cardiology

## 2010-12-07 ENCOUNTER — Other Ambulatory Visit: Payer: Self-pay | Admitting: Cardiology

## 2010-12-07 LAB — BASIC METABOLIC PANEL
BUN: 8 mg/dL (ref 6–23)
CO2: 30 mEq/L (ref 19–32)
Chloride: 102 mEq/L (ref 96–112)
Creatinine, Ser: 1.01 mg/dL (ref 0.4–1.5)
GFR calc Af Amer: >60 mL/min (ref >60)
GFR calc non Af Amer: >60 mL/min (ref >60)
Potassium: 3.4 mEq/L — ABNORMAL LOW (ref 3.5–5.1)
Sodium: 139 mEq/L (ref 135–145)

## 2010-12-07 LAB — APTT: aPTT: 28 seconds (ref 24–37)

## 2010-12-07 LAB — GLUCOSE, CAPILLARY
Glucose-Capillary: 124 mg/dL — ABNORMAL HIGH (ref 70–99)
Glucose-Capillary: 160 mg/dL — ABNORMAL HIGH (ref 70–99)
Glucose-Capillary: 168 mg/dL — ABNORMAL HIGH (ref 70–99)
Glucose-Capillary: 174 mg/dL — ABNORMAL HIGH (ref 70–99)
Glucose-Capillary: 236 mg/dL — ABNORMAL HIGH (ref 70–99)

## 2010-12-07 LAB — CBC
HCT: 36.7 % — ABNORMAL LOW (ref 39.0–52.0)
HCT: 38.7 % — ABNORMAL LOW (ref 39.0–52.0)
Hemoglobin: 11.4 g/dL — ABNORMAL LOW (ref 13.0–17.0)
Hemoglobin: 11.7 g/dL — ABNORMAL LOW (ref 13.0–17.0)
Hemoglobin: 12.8 g/dL — ABNORMAL LOW (ref 13.0–17.0)
Hemoglobin: 13.4 g/dL (ref 13.0–17.0)
MCHC: 34.5 g/dL (ref 30.0–36.0)
MCV: 93.6 fL (ref 78.0–100.0)
MCV: 94 fL (ref 78.0–100.0)
MCV: 94.6 fL (ref 78.0–100.0)
Platelets: 157 10*3/uL (ref 150–400)
Platelets: 173 10*3/uL (ref 150–400)
RBC: 3.48 MIL/uL — ABNORMAL LOW (ref 4.22–5.81)
RDW: 13 % (ref 11.5–15.5)
RDW: 13.2 % (ref 11.5–15.5)
WBC: 4.5 10*3/uL (ref 4.0–10.5)
WBC: 4.5 10*3/uL (ref 4.0–10.5)
WBC: 4.6 10*3/uL (ref 4.0–10.5)

## 2010-12-07 LAB — LIPID PANEL
Cholesterol: 245 mg/dL — ABNORMAL HIGH (ref 0–200)
HDL: 27 mg/dL — ABNORMAL LOW (ref >39)
Total CHOL/HDL Ratio: 9.1 RATIO
VLDL: UNDETERMINED mg/dL (ref 0–40)

## 2010-12-07 LAB — PROTIME-INR: Prothrombin Time: 12.8 seconds (ref 11.6–15.2)

## 2010-12-07 LAB — COMPREHENSIVE METABOLIC PANEL
Alkaline Phosphatase: 62 U/L (ref 39–117)
BUN: 13 mg/dL (ref 6–23)
CO2: 31 mEq/L (ref 19–32)
Chloride: 99 mEq/L (ref 96–112)
GFR calc non Af Amer: >60 mL/min (ref >60)
Glucose, Bld: 205 mg/dL — ABNORMAL HIGH (ref 70–99)
Potassium: 3.7 mEq/L (ref 3.5–5.1)
Total Bilirubin: 0.4 mg/dL (ref 0.3–1.2)

## 2010-12-07 LAB — CARDIAC PANEL(CRET KIN+CKTOT+MB+TROPI): Relative Index: 1 (ref 0.0–2.5)

## 2010-12-07 LAB — POCT CARDIAC MARKERS: Troponin i, poc: <0.05 ng/mL (ref 0.00–0.09)

## 2010-12-07 LAB — HEPARIN LEVEL (UNFRACTIONATED): Heparin Unfractionated: <0.1 IU/mL — ABNORMAL LOW (ref 0.30–0.70)

## 2010-12-07 LAB — POCT I-STAT, CHEM 8
BUN: 12 mg/dL (ref 6–23)
Chloride: 104 mEq/L (ref 96–112)
Creatinine, Ser: 0.8 mg/dL (ref 0.4–1.5)
Potassium: 4.5 mEq/L (ref 3.5–5.1)
Sodium: 133 mEq/L — ABNORMAL LOW (ref 135–145)

## 2010-12-07 LAB — CK TOTAL AND CKMB (NOT AT ARMC)
Relative Index: 1.2 (ref 0.0–2.5)
Total CK: 117 U/L (ref 7–232)

## 2010-12-07 LAB — TROPONIN I: Troponin I: <0.01 ng/mL (ref 0.00–0.06)

## 2010-12-29 ENCOUNTER — Other Ambulatory Visit: Payer: Self-pay

## 2010-12-29 MED ORDER — LEVOTHYROXINE SODIUM 112 MCG PO TABS: 112.0000 ug | ORAL_TABLET | Freq: Every day | ORAL | Status: DC

## 2010-12-29 NOTE — Telephone Encounter (Signed)
[facility] 112

## 2011-01-09 NOTE — Discharge Summary (Signed)
NAME:  KRUZE, ATCHLEY NO.:  [id number]   MEDICAL RECORD NO.:  [id number]          PATIENT TYPE:  INP   LOCATION:  2928                         FACILITY:  [facility]   PHYSICIAN:  Doylene Canning. Ladona Ridgel, MD    DATE OF BIRTH:  [birth year]   DATE OF ADMISSION:  10/27/2008  DATE OF DISCHARGE:  10/30/2008                               DISCHARGE SUMMARY   PRIMARY CARDIOLOGIST:  Rollene Rotunda, MD, [facility]   DISCHARGE DIAGNOSIS:  1. Multivessel coronary artery disease.      a.     Status post staged, multivessel percutaneous intervention of       circumflex and right coronary arteries.      b.     Normal left ventricular function.   SECONDARY DIAGNOSES:  1. Hypertension.  2. Dyslipidemia.  3. Type 2 diabetes mellitus.  4. Hypothyroidism.   PROCEDURES:  Percutaneous intervention with drug-eluting stenting of the  circumflex artery, March 4; staged drug-eluting stenting of the right  coronary artery (x2), March 5.   REASON FOR ADMISSION:  [place] is a 50 year old male, with no  prior history of documented CAD, but with numerous cardiac risk factors.  He presented with symptoms worrisome for unstable angina pectoris, and  he was admitted for further evaluation and management.   HOSPITAL COURSE:  Initial set of cardiac markers was within normal  limits.  The patient was stabilized on a regimen consisting of  intravenous heparin and was maintained on his home medication regimen of  aspirin, beta-blocker, ACE inhibitor, and statin.   He was referred for diagnostic coronary angiography, performed by Dr.  Charlies Constable (see cath report for full details), revealing multivessel  CAD, with high-grade stenosis of the mid CFX and mid and distal right  coronary artery.  The patient underwent initial intervention with  placement of a Xience drug-eluting stent with successful dilatation of  the 90% mid CFX lesion, with no noted complications.  He then returned  the following day for  staged intervention of the right coronary artery,  performed by Dr. Tonny Bollman.  Again, he underwent placement of  Xience drug-eluting stent to both the mid and distal right coronary  artery.  The procedure, however, was complicated by edge dissection of  the proximal and distal right coronary artery, both treated successfully  with short Xience stents.   The patient is to remain on a regimen of aspirin/Plavix for least 12  months.  He was cleared for discharge following day, in hemodynamically  stable condition, with no noted complications of the bilateral groin  sites.   DISCHARGE LABORATORY DATA:  WBC 5.7, hemoglobin 11.4, hematocrit 32.9,  and platelets 155.  Sodium 139, potassium 4.0, glucose 135, BUN 7, and  creatinine 0.8.  Notable labs:  Normal cardiac enzymes (one set).  Total  cholesterol 245, triglyceride 619, and HDL 27.  TSH 6.23.   DISPOSITION:  Stable.   DISCHARGE MEDICATIONS:  1. Plavix 150 mg daily (x1 week), then 75 mg daily.  2. Coated aspirin 325 mg daily.  3. Toprol-XL 100 mg daily.  4. Lisinopril 20 mg daily.  5. Clonidine 0.1 mg b.i.d.  6. Norvasc 10 mg daily.  7. Crestor 40 mg daily.  8. Zetia 10 mg daily.  9. Synthroid 0.112 mg daily.  10.Metformin 500 mg b.i.d.  11.Nitrostat 0.4 mg, as needed.   FOLLOWUP INSTRUCTIONS:  1. Mr. Maselli is to follow up with Dr. Rollene Rotunda in 2 weeks.      Arrangements to be made through our office.  2. The patient is to contact the office in the event of any      swelling/bleeding of the groin incision sites.  3. The patient is cleared to return to work on November 08, 2008.   DISCHARGE ENCOUNTER:  Greater than 30 minutes duration, including  physician time.      Gene Serpe, PA-C      Doylene Canning. Ladona Ridgel, MD  Electronically Signed    GS/MEDQ  D:  10/30/2008  T:  10/31/2008  Job:  [id number]   cc:   Tera Mater. Clent Ridges, MD

## 2011-01-09 NOTE — Cardiovascular Report (Signed)
NAME:  [place], [place] NO.:  [id number]   MEDICAL RECORD NO.:  [id number]          PATIENT TYPE:  INP   LOCATION:  2928                         FACILITY:  [facility]   PHYSICIAN:  Veverly Fells. Excell Seltzer, MD  DATE OF BIRTH:  May 20, 1961   DATE OF PROCEDURE:  10/29/2008  DATE OF DISCHARGE:  10/30/2008                            CARDIAC CATHETERIZATION   PROCEDURES:  1. IVUS of the right coronary artery.  2. PTCA and stenting of the right coronary artery.  3. Angio-Seal of the left femoral artery.   INDICATIONS:  Mr. Sarvis presented with unstable angina.  He was  found to have two-vessel coronary artery disease.  He underwent left  circumflex stenting on October 28, 2008, by Dr. Juanda Chance.  The procedure was  complicated by an edge dissection after stenting.  The dissection was  treated with balloon angioplasty and there was an excellent angiographic  result at the completion of the procedure.  A second vessel was not done  in that setting and the patient was brought back today for PCI of the  right coronary artery.  He has moderately severe stenoses in the mid and  distal portions of the right coronary artery.  I elected to proceed with  angiography and intravascular ultrasound to guide the procedure.   Risks and indications of the procedure were reviewed with the patient.  Informed consent was obtained.  The left groin was prepped, draped, and  anesthetized with 1% lidocaine using modified Seldinger technique.  A 6-  French sheath was placed in the left femoral artery.  A 6-French JR-4  guide catheter was inserted.  Initial angiography was performed.  Angiomax was used for anticoagulation.  Once a therapeutic ACT was  achieved, a Cougar guidewire was passed easily into the right PDA.  Intravascular ultrasound was performed under automated pullback.  This  demonstrated a diffusely diseased vessel.  There were severe stenoses of  both the distal right coronary artery and mid  right coronary artery with  heavy atherosclerotic burden and minimal lumen area less than 4 m2 in  both regions.  I elected to attempt to treat the distal lesion with  primary stenting.  There appeared to be 3-mm reference vessel by  intravascular ultrasound.  A 3.0 x 15-mm XIENCE stent was chosen.  The  stent would not pass.  Therefore, I elected to proceed with balloon  angioplasty.  A 2.5 x 12-mm apex balloon was taken down and inflated to  8 atmospheres.  The balloon appeared well expanded.  An angiogram was  performed for assessing the lesion length.  The balloon was then brought  back to the mid vessel and an inflation was taken to 12 atmospheres over  the lesion in the mid vessel.  An angiogram was again performed to  assess lesion length in that region.  I was then able to advance the 3.0  x 15-mm XIENCE stent into the distal vessel.  The stent was deployed at  14 atmospheres and appeared well expanded.  Post-stent angiography  demonstrated excellent stent expansion.  There was a linear density of  the distal  edge of the stent that appeared to be an edge dissection.  Further images were taken and there was clearly an edge dissection  present.  There remained TIMI 3 flow in the vessel.  I treated the edge  dissection with a 2.5 x 8-mm XIENCE stent which was carefully positioned  in multiple views to make sure that there was complete coverage of the  dissection as well as overlap of the stent.  The distal edge of the  stent went right into the bifurcation of the PDA and posterior AV  segment.  The stent was deployed at 10 atmospheres.  Following stenting,  there was an excellent result.  The stent was postdilated with a 3.0 x  15-mm Voyager [place] balloon which was taken to 14 atmospheres over the  distal portion of the stent and to 16 atmospheres over the proximal  portion of the stent.  There was a good angiographic result.  Attention  was then turned to the proximal lesion.  The  vessel was bigger in this  area.  Therefore, a 3.5 x 15-mm XIENCE stent was carefully positioned  and deployed at 14 atmospheres.  It appeared well expanded.  There was a  good angiographic result with only minimal residual waste in that area.  A 3.75 x 12-mm Voyager [place] balloon was used to postdilate and  postdilatation was performed to 16 atmospheres.  Angiography was  performed.  There was an excellent result in the mid lesion.  However,  there was an appearance of a proximal edge dissection in the distal  vessel.  I elected to perform intravascular ultrasound to determine  whether to place a third overlapped stent in that region.  IVUS was  performed and an automated pullback was done.  This demonstrated a clear  cut edge dissection.  It only involved approximately 25% of the lumen  and there was TIMI 3 flow.  However, I thought there was some risk of  closure and elected to treat the area with a focal stent.  A 3.0 x 8-mm  XIENCE stent was used.  It was deployed at 10 atmospheres.  The [place]  Voyager balloon was passed back down and inflated to 16 atmospheres on 2  inflations to complete the procedure and treat the overlapped area of  stent once again.  At the completion of the procedure, there was an  excellent angiographic result.  There was TIMI 3 flow in the vessel with  no residual stenosis present.   A femoral angiogram was performed and demonstrated access in the common  femoral artery.  An Angio-Seal device was used to close the arteriotomy.  The device felt like it deployed normally, but there was a brisk ooze  from the vessel.  Manual pressure was held for approximately 5 minutes  and a Fem-Stop was ultimately placed.  The patient did not develop a  hematoma.   ASSESSMENT:  Successful stenting of the mid and distal right coronary  artery.  Percutaneous coronary intervention was complicated by proximal  and distal edge dissections of the distal stent.  Both areas were   treated with focal drug-eluting stents.  The patient had an excellent  angiographic result at the completion of the procedure.   Recommend minimum of 12 months dual antiplatelet therapy with aspirin  and Plavix.      Veverly Fells. Excell Seltzer, MD  Electronically Signed     Veverly Fells. Excell Seltzer, MD  Electronically Signed    MDC/MEDQ  D:  11/02/2008  T:  11/03/2008  Job:  [id number]

## 2011-01-09 NOTE — Cardiovascular Report (Signed)
NAME:  [place], [place]             ACCOUNT NO.:  [id number]   MEDICAL RECORD NO.:  [id number]          PATIENT TYPE:  INP   LOCATION:  2502                         FACILITY:  [facility]   PHYSICIAN:  Everardo Beals. Juanda Chance, MD, FACCDATE OF BIRTH:  12-21-1960   DATE OF PROCEDURE:  10/28/2008  DATE OF DISCHARGE:                            CARDIAC CATHETERIZATION   HISTORY:  Mr. Matusek is 50 year old and has a strong family history  of heart disease with a brother who died suddenly in 2023-08-28, another  brother who had stents placed in Aug 28, 2023.  He was admitted with a 2-  week history of chest pain felt to represent unstable angina.  He has a  history of hypertension and hyperlipidemia and diabetes.  His markers  were negative.   PROCEDURE:  The procedure was performed by the right femoral artery and  arterial sheath and 5-French pyriform coronary catheters.  A front wall  arterial puncture was formed and Omnipaque contrast was used.  After  completion of diagnostic study, we made a decision to proceed with  percutaneous intervention.  We will plan to intervene on the circ and  the right coronary artery of the circ quickly.   The patient was given Angiomax bolus infusion and was given 600 mg of  Plavix and also given chewable aspirin.  We used a CLS4 guiding catheter  with side holes.  We passed a Prowater wire down the circumflex artery  into the first marginal branch.  The lesion was located just before  bifurcation of the marginal and AV circumflex and we thought we could  end the stent just before the bifurcation.  We predilated with a 3.0 x  12 mm apex balloon performing one inflation up to 8 atmospheres for 30  seconds.  We then deployed a 3.5 x 12 mm XIENCE stent with one inflation  of 14 atmospheres for 30 seconds.  We postdilated with a 3.75 x 8 mm  Quantum Maverick performing one inflation up to 16 atmospheres for 30  seconds.  At this point, it appeared there was a small edge tear  in the  AV branch of circumflex artery just distal to the stent.  This was not  easily treated with another stent, so went back in with a 2.5 and then a  3.0 x 15-mm apex balloon and performed a long inflation of a minute at 4  atmospheres.  This greatly improved the appearance of the vessel.  The  wires were then removed and final diagnosis were then performed through  the guiding catheter.   We used over 350 mL of contrast, so we elected to not proceed with the  right coronary intervention today.  We also elected to give __________  because of the small edge tear.   The patient had quite a bit of heartburn, thought related to Plavix, but  otherwise tolerated lost laboratory and left the laboratory in  satisfactory condition.  The right femoral artery was closed Angio-Seal  at the end of procedure.   RESULTS:  Left main coronary artery:  The left main coronary artery had  a 40% distal  stenosis.   The left anterior descending artery gave rise to a large diagonal  branch, 2 septal perforators and smaller diagonal branch.  There was 30%  ostial stenosis and 30% proximal stenosis and there was 50% stenosis in  the midvessel.  Vessel was diffusely irregular.  There was also a 30%  narrowing in the first diagonal branch.  On the very first injection,  there was slow TIMI I flow down the LAD associated chest pain, but by  the second injection, this had resolved and the chest pain resolved.  There was no obvious emboli or abnormality seen.  There was no obvious  emboli seen.   Circumflex artery:  The circumflex artery was a moderately large vessel,  gave rise to a small marginal branch, a large marginal branch, and a  posterolateral branch.  There was 40% proximal stenosis.  There was 90%  stenosis in the midvessel just before the bifurcation of the marginal  and AV branch.  The was 40% narrowing in the distal circumflex artery  before the posterolateral branch.   Right coronary:  The  right coronary is a moderate-sized vessel and gave  rise to right ventricular branch, posterior descending branch, and 2  posterolateral branches.  There was 80% narrowing in the mid right  coronary artery and 80% narrowing in the distal right coronary.   Left ventriculogram:  The left ventriculogram performed on RAO  projection showed good wall motion with no areas of hypokinesis.   Following stenting of the lesion in the mid circumflex artery stenosis  improved from 90% to 0%.  After stenting, there was what appeared to  small dissection in the AV branch of the circumflex artery, but this  resolved to less than 10% following balloon dilatation.   The aortic pressure was 115/73 with a mean of 92 and left ventricular  pressure was 115/14.   CONCLUSION:  1. Coronary artery disease with 40% narrowing in the distal left main,      30% ostial and 30% proximal and 50% mid stenosis in the LAD, 40%      proximal, 40% distal, and 90% mid stenosis in the circumflex artery      and 80% mid and 80% distal stenosis in the right coronary with      normal LV function.  2. Successful PCI of the lesion in the mid circumflex artery using a      XIENCE drug-eluting stent with improvement in central narrowing      from 90% to 0%.   DISPOSITION:  The patient returned to post angio room for further  observation.  We will plan to intervention on the mid and distal right  coronary tomorrow.      Bruce Elvera Lennox Juanda Chance, MD, [facility]  Electronically Signed     BRB/MEDQ  D:  10/28/2008  T:  10/29/2008  Job:  [id number]   cc:   Rollene Rotunda, MD, [facility]  Tera Mater. Clent Ridges, MD

## 2011-01-09 NOTE — H&P (Signed)
NAME:  [place], Robert Huang NO.:  [id number]   MEDICAL RECORD NO.:  [id number]          PATIENT TYPE:  INP   LOCATION:  4707                         FACILITY:  [facility]   PHYSICIAN:  [place], [place] Huang, FACCDATE OF BIRTH:  [birth year]   DATE OF ADMISSION:  10/27/2008  DATE OF DISCHARGE:                              HISTORY & PHYSICAL   PRIMARY CARE [place]:  [place]. [place], [place] Huang   The patient is a 50 year old married Caucasian male with prior history  of chest pain, status post negative Myoview about 6 years ago.  He  presents with a 48-month history of ongoing chest pain.   PROBLEMS:  1. Chest pain.      a.     Status post negative Myoview about 6 years ago.  2. Hypertension x18 years.  3. Hyperlipidemia x18 years.  4. Diabetes mellitus x1 year.  5. Hypothyroidism.  6. Anxiety.  7. Gout.   HISTORY OF PRESENT ILLNESS:  A 50 year old married Caucasian male with  prior history of chest pain status post negative Myoview approximately 6  years ago.  The patient's brother died suddenly of MI at age 79 on  August 13, 2008.  His other brother developed chest pain 3 days later  at the brother's funeral and was subsequently stented x2.  This has been  weighing on the patient quite a bit.  Since January, he has been  experiencing almost daily rest and exertional, midsternal chest pressure  without associated symptoms, lasting just a few seconds, resolving  spontaneously.  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  Approximately 2 to 3 weeks ago, besides the chest pressure, he has been  experiencing electrical impulse like chest pain extending from the left  side of his sternum across the left chest and occasionally down his left  arm and into the palm of his left hand.  This has been constant for the  past 3 days and despite it he has not had any limitations on activities.  Today, he was working at  home with a computer, while sitting on his  couch, and had sudden onset of  9/10 sharp knife like pain in his mid  sternum associated with dyspnea and mild diaphoresis.  This was the  first time he had significant associated symptoms.  The pain lasted at  9/10 for just a few seconds and then eased of to a 4/10 where it has  persisted for the last 6 hours.  The patient presented to the ED about 1  hour after onset of symptoms and has been placed on nitropaste without  much effect.  He has 1 set of enzymes which is negative.  ECG shows no  acute changes.  Pain is no worse with palpation, deep breathing,  coughing, or positioning.   ALLERGIES:  No known drug allergies.   HOME MEDICATIONS:  1. Toprol-XL 100 mg b.i.d.  2. Crestor 40 mg daily.  3. Clonidine 0.1 mg b.i.d.  4. Lisinopril 20 mg daily.  5. Norvasc 10 mg  daily.  6. Levothyroxine 112 mcg daily.  7. Aspirin 325 mg daily.  8. Glucophage 500 mg b.i.d.   FAMILY HISTORY:  Mother died of an MI at 76.  Father died of an MI at  48, although he had his first MI in his 30s.  Brother recently died of  an MI in 09-07-2023 at age 43, and then his other brother recently was  stented also in his 69s.  Another brother who is 14 years older had  bypass surgery in his 48s as well.   SOCIAL HISTORY:  The patient lives in [place] wife.  He has  3 children ages 25, 19, and 36.  The patient works in Field seismologist,  Art gallery manager.  He has never smoked cigarettes,  although he dips one tin of tobacco a day.  He will drink 2-3 beers a  day.  Denies any drug use.  He is not routinely exercising.   REVIEW OF SYSTEMS:  Positive for chest pain as outlined in HPI.  The  patient also describes nocturia.  He has a history of gout and when he  takes Indocin, he may occasionally notice a few flecks of blood in his  stool.  He has not taken Indocin in sometime and has not had any recent  bright red blood or melena.  He has never  had an EGD or colonoscopy.  He  had dyspnea today as well as diaphoresis in association with his chest  pain, but otherwise he has not had these symptoms.  He is diabetic.  Otherwise, all systems reviewed and negative.   PHYSICAL EXAM:  VITAL SIGNS:  Temperature 97.9, heart rate 58,  respirations 14, blood pressure 125/84, pulse ox 100% on 2 L.  GENERAL:  Pleasant white male in no acute distress.  Awake, alert, and  oriented x3.  PSYCHE:  Normal affect.  NEUROVASCULAR:  Grossly intact, nonfocal.  HEENT:  Normal.  SKIN:  Warm and dry without lesions or masses.  MUSCULOSKELETAL:  Grossly normal without deformity or effusion.  NECK:  No bruits, JVD.  LUNGS:  Respirations regular and unlabored, clear to auscultation.  CARDIAC:  Regular S1, S2.  No S3, S4, or murmurs.  ABDOMEN:  Round, soft, nontender, nondistended, bowel sounds present x4.  EXTREMITIES:  Warm, dry, and pink.  No clubbing, cyanosis, or edema.  Dorsalis pedis, posterior tibial pulses 2+ and equal bilaterally.  No  femoral bruits are noted.   Chest x-ray shows no active cardiopulmonary disease.  EKG shows sinus  rhythm at a rate of 67, left axis.  No acute ST-T changes.  Sodium 133,  potassium 4.5, chloride 104, CO2 21, BUN 12, creatinine 0.8, glucose  176, CK-MB  less than 1.02, troponin I was less than 0.05.   ASSESSMENT AND PLAN:  1. Chest pain, typical and atypical features with multiple risk      factors including hypertension, diabetes, hyperlipidemia, and      remarkable family history.  The patient had recurrent symptoms      today that were associated with dyspnea and diaphoresis and are      concerning for  unstable angina.  Plan to admit and cycle cardiac      markers.  We will add heparin and nitrate.  We will continue      aspirin, beta-blocker, ACE inhibitor, statin.  Plan cardiac      catheterization in the a.m.  2. Hypertension.  Blood pressure is elevated here in the ED.  We will      continue his home  medications and follow closely.  3. Hyperlipidemia.  Check lipids, LFTs.  Continue Crestor therapy.  Of      note, his LDL in January was 137.  We will add Zetia.  4. Diabetes mellitus.  Hemoglobin A1c was 8.6 in January.  We will be      holding his Glucophage and sliding scale insulin.  The patient will      need education and potential titration of his metformin.  5. Hypothyroidism.  Check TSH.  Continue Synthroid.  6. Tobacco abuse.  The patient dips tobacco one can a day.  Cessation      strongly advised.  We will obtain a tobacco cessation consult.      Nicolasa Ducking, ANP      [place], [place], [facility]  Electronically Signed    CB/MEDQ  D:  10/27/2008  T:  10/28/2008  Job:  [id number]

## 2011-01-09 NOTE — Assessment & Plan Note (Signed)
[facility]                            CARDIOLOGY OFFICE NOTE   EMMITTE, SURGEON                 MRN:          [id number]  DATE:11/15/2008                            DOB:          [birth year]    Robert Huang is a 50 year old gentleman that was recently admitted to  [facility] with chest pain.  He was seen by Dr. Antoine Poche.  He  has multiple risk factors including hypertension, diabetes, and  hyperlipidemia, and subsequently underwent cardiac catheterization.  He  ultimately underwent drug-eluting stent to his circumflex, and in the  states fashion, drug-eluting stent to his right coronary artery the  following day which was October 29, 2008.  Since discharge, he has had  occasional chest discomfort.  He states he feels like something is not  quite right in in the left chest area.  It has been present ever since  his stents were put in including the day immediately following his  stents.  The pain can radiate to his shoulder.  It is present a  significant amount of time.  It is not pleuritic or positional nor is it  exertional.  There is no associated nausea or vomiting, shortness of  breath or diaphoresis.  He states that it is not there when he does not  pay attention.  He has not had exertional chest pain.  He also feels a  sharp discomfort occasionally for 1-2 seconds in the left axillary area.  He denies any dyspnea, presyncope, or syncope.  Note, he does not smoke.   CURRENT MEDICATIONS:  1. Aspirin 325 mg p.o. daily.  2. Plavix 75 mg p.o. daily.  3. Zetia 10 mg p.o. daily.  4. Levofloxacin 112 mcg p.o. daily.  5. Fenofibrate 160 mg p.o. daily.  6. Amlodipine 10 mg p.o. daily.  7. Metformin 1000 mg p.o. b.i.d.  8. Crestor 40 mg p.o. daily.  9. Zolpidem 10 mg daily.  10.Lisinopril HCT 20/12.5 mg p.o. b.i.d.  11.Metoprolol 100 mg p.o. b.i.d.  12.Clonidine 0.1 mg p.o. b.i.d.   PHYSICAL EXAMINATION:  VITAL SIGNS:  Today shows a  blood pressure of  146/91 and his pulse 59.  He weighs 223 pounds.  HEENT:  Normal.  NECK:  Supple.  CHEST:  Clear.  CARDIOVASCULAR:  Regular rate and rhythm.  GENITOURINARY:  His right groin and left groin showed no hematoma, no  bruits.  EXTREMITIES:  No edema.   His electrocardiogram shows a sinus rhythm at 156.  There is left  ventricular hypertrophy.  There are no ST changes noted.   DIAGNOSES:  1. Atypical chest pain - the patient's symptoms are atypical.  His      electrocardiogram shows no ST changes.  Note, his symptoms have      been present ever since the stents were put in and do not sound      cardiac.  We will not pursue this further at this point.  2. Coronary artery disease status post recent drug-eluting stent to      the right coronary artery and circumflex - he will continue his  aspirin, Plavix, beta-blocker, ACE inhibitor, and statin.  3. Diabetes mellitus - management per Robert Huang.  4. Hypertension - his blood pressure is mildly elevated.  I have asked      him to track this at home and I will see him back in [facility] in      6 weeks.  If his systolic is greater than 130 and diastolic greater      than 95, then we will adjust his regimen at that time.  5. Hyperlipidemia - the patient has severe hyperlipidemia.  I have      asked to follow up in our Lipid Clinic.  6. Hypothyroidism - he will continue on his levofloxacin.   We did review risk factor modification today including diet and  exercise.  He does not smoke.     Robert Frieze [name], MD, [facility]  Electronically Signed    BSC/MedQ  DD: 11/15/2008  DT: 11/16/2008  Job #: [id number]

## 2011-01-15 ENCOUNTER — Telehealth: Payer: Self-pay

## 2011-01-15 NOTE — Telephone Encounter (Signed)
Call in #30 with 5 rf 

## 2011-01-15 NOTE — Telephone Encounter (Signed)
rx request for zolpidem tartrate 10mg ---pls advise

## 2011-01-16 ENCOUNTER — Other Ambulatory Visit: Payer: Self-pay | Admitting: *Deleted

## 2011-01-16 ENCOUNTER — Other Ambulatory Visit: Payer: Self-pay

## 2011-01-16 MED ORDER — ZOLPIDEM TARTRATE 10 MG PO TABS: 10.0000 mg | ORAL_TABLET | Freq: Every evening | ORAL | Status: DC | PRN

## 2011-01-16 MED ORDER — METFORMIN HCL 1000 MG PO TABS: 1000.0000 mg | ORAL_TABLET | Freq: Two times a day (BID) | ORAL | Status: DC

## 2011-01-16 MED ORDER — AMLODIPINE BESYLATE 10 MG PO TABS: 10.0000 mg | ORAL_TABLET | Freq: Every day | ORAL | Status: DC

## 2011-01-16 NOTE — Telephone Encounter (Signed)
rx called into pharmacy

## 2011-02-02 ENCOUNTER — Telehealth: Payer: Self-pay | Admitting: Family Medicine

## 2011-02-02 MED ORDER — ALPRAZOLAM 0.5 MG PO TABS: 0.5000 mg | ORAL_TABLET | Freq: Three times a day (TID) | ORAL | Status: DC | PRN

## 2011-02-02 NOTE — Telephone Encounter (Signed)
Pt came by and said that [facility] [address] has sent numerous refill req for pts alprazolam 0.5 mg, with no response. Pt is needing this med called in today.

## 2011-02-02 NOTE — Telephone Encounter (Signed)
Actually they have not sent in a single request. Please call in #60 with 5 rf

## 2011-02-08 ENCOUNTER — Telehealth: Payer: Self-pay | Admitting: *Deleted

## 2011-02-08 NOTE — Telephone Encounter (Signed)
Refill on hydrocodone/apap 5/500 #60

## 2011-02-08 NOTE — Telephone Encounter (Signed)
Refill once 

## 2011-02-09 MED ORDER — HYDROCODONE-ACETAMINOPHEN 5-500 MG PO TABS: 1.0000 | ORAL_TABLET | Freq: Four times a day (QID) | ORAL | Status: DC | PRN

## 2011-02-09 NOTE — Telephone Encounter (Signed)
rx sent to pharmacy

## 2011-02-15 ENCOUNTER — Other Ambulatory Visit: Payer: Self-pay | Admitting: Cardiology

## 2011-02-17 ENCOUNTER — Other Ambulatory Visit: Payer: Self-pay | Admitting: Cardiology

## 2011-02-27 ENCOUNTER — Other Ambulatory Visit: Payer: Self-pay | Admitting: *Deleted

## 2011-02-27 MED ORDER — HYDROCODONE-ACETAMINOPHEN 5-500 MG PO TABS: 1.0000 | ORAL_TABLET | Freq: Four times a day (QID) | ORAL | Status: DC | PRN

## 2011-02-27 NOTE — Telephone Encounter (Signed)
Call in #60 with 5 rf 

## 2011-02-27 NOTE — Telephone Encounter (Signed)
Refill request from pharmacy for hydrocodone-acetaminophen 5-500. Pt last seen 08/30/10 and last refill 02-09-11 #30 1 tablet every 6 hours as needed for pain.

## 2011-02-27 NOTE — Telephone Encounter (Signed)
Fax rx request to fill Hydrocodone 5-500, one tab every 6 hours prn pain, last filled #30 with 0 refills on 6/15. I received this by mistake, please return message to Dr Claris Che nurse

## 2011-03-15 ENCOUNTER — Other Ambulatory Visit: Payer: Self-pay | Admitting: Cardiology

## 2011-06-08 ENCOUNTER — Telehealth: Payer: Self-pay | Admitting: *Deleted

## 2011-06-08 NOTE — Telephone Encounter (Signed)
[facility] [address] is calling for a Vicodin refill on pt.

## 2011-06-08 NOTE — Telephone Encounter (Signed)
Call in #60 with 5 rf 

## 2011-06-12 ENCOUNTER — Other Ambulatory Visit: Payer: Self-pay | Admitting: Family Medicine

## 2011-06-12 ENCOUNTER — Encounter: Payer: Self-pay | Admitting: Family Medicine

## 2011-06-12 ENCOUNTER — Ambulatory Visit (INDEPENDENT_AMBULATORY_CARE_PROVIDER_SITE_OTHER): Payer: Managed Care, Other (non HMO) | Admitting: Family Medicine

## 2011-06-12 VITALS — BP 130/90 | HR 74 | Temp 98.4°F | Wt 231.0 lb

## 2011-06-12 DIAGNOSIS — E119 Type 2 diabetes mellitus without complications: Secondary | ICD-10-CM

## 2011-06-12 DIAGNOSIS — E785 Hyperlipidemia, unspecified: Secondary | ICD-10-CM

## 2011-06-12 DIAGNOSIS — Z0000 Encounter for general adult medical examination without abnormal findings: Secondary | ICD-10-CM

## 2011-06-12 DIAGNOSIS — L989 Disorder of the skin and subcutaneous tissue, unspecified: Secondary | ICD-10-CM

## 2011-06-12 DIAGNOSIS — Z23 Encounter for immunization: Secondary | ICD-10-CM

## 2011-06-12 LAB — CBC WITH DIFFERENTIAL/PLATELET
Basophils Relative: 0.8 % (ref 0.0–3.0)
Eosinophils Relative: 1.7 % (ref 0.0–5.0)
HCT: 39.7 % (ref 39.0–52.0)
Hemoglobin: 13.4 g/dL (ref 13.0–17.0)
Lymphocytes Relative: 10.2 % — ABNORMAL LOW (ref 12.0–46.0)
Lymphs Abs: 0.6 10*3/uL — ABNORMAL LOW (ref 0.7–4.0)
Monocytes Relative: 10 % (ref 3.0–12.0)
Neutro Abs: 4.8 10*3/uL (ref 1.4–7.7)
RBC: 4.03 Mil/uL — ABNORMAL LOW (ref 4.22–5.81)
WBC: 6.2 10*3/uL (ref 4.5–10.5)

## 2011-06-12 LAB — LIPID PANEL
Cholesterol: 431 mg/dL — ABNORMAL HIGH (ref 0–200)
HDL: 38.3 mg/dL — ABNORMAL LOW (ref >39.00)
Total CHOL/HDL Ratio: 11
VLDL: 149.4 mg/dL — ABNORMAL HIGH (ref 0.0–40.0)

## 2011-06-12 LAB — POCT URINALYSIS DIPSTICK
Bilirubin, UA: NEGATIVE
Ketones, UA: NEGATIVE
Protein, UA: NEGATIVE
Spec Grav, UA: 1.015

## 2011-06-12 LAB — LDL CHOLESTEROL, DIRECT: Direct LDL: 253.2 mg/dL

## 2011-06-12 LAB — BASIC METABOLIC PANEL
BUN: 15 mg/dL (ref 6–23)
Chloride: 99 mEq/L (ref 96–112)
Creatinine, Ser: 0.9 mg/dL (ref 0.4–1.5)
GFR: 100.8 mL/min (ref >60.00)
Potassium: 3.8 mEq/L (ref 3.5–5.1)

## 2011-06-12 LAB — TSH: TSH: 1.86 u[IU]/mL (ref 0.35–5.50)

## 2011-06-12 LAB — HEMOGLOBIN A1C: Hgb A1c MFr Bld: 9.7 % — ABNORMAL HIGH (ref 4.6–6.5)

## 2011-06-12 MED ORDER — HYDROCODONE-ACETAMINOPHEN 5-500 MG PO TABS: 1.0000 | ORAL_TABLET | Freq: Four times a day (QID) | ORAL | Status: DC | PRN

## 2011-06-12 MED ORDER — LEVOTHYROXINE SODIUM 112 MCG PO TABS: 112.0000 ug | ORAL_TABLET | Freq: Every day | ORAL | Status: DC

## 2011-06-12 MED ORDER — ALPRAZOLAM 1 MG PO TABS: 1.0000 mg | ORAL_TABLET | Freq: Three times a day (TID) | ORAL | Status: DC | PRN

## 2011-06-12 MED ORDER — INDOMETHACIN ER 75 MG PO CPCR: 75.0000 mg | ORAL_CAPSULE | ORAL | Status: DC | PRN

## 2011-06-12 NOTE — Progress Notes (Signed)
  Subjective:    Patient ID: Robert Huang ZOXWRUEAVW, male    DOB: December 21, 1960, 50 y.o.   MRN: [id number]  HPI Here for follow up. He is doing well in general. He is fasting for labs. He watches his diet closely, but his glucoses at home have gone up in the past 6 months. He has not taken Crestor for several months due to cost concerns. He needs some med refills. He would like to increase the dose on his Xanax since he usually has to take 2 pills at a time.    Review of Systems  Constitutional: Negative.   Respiratory: Negative.   Cardiovascular: Negative.   Psychiatric/Behavioral: The patient is nervous/anxious.        Objective:   Physical Exam  Constitutional: He appears well-developed and well-nourished.  Neck: Neck supple. No thyromegaly present.  Cardiovascular: Normal rate, regular rhythm, normal heart sounds and intact distal pulses.   Pulmonary/Chest: Effort normal and breath sounds normal.  Lymphadenopathy:    He has no cervical adenopathy.          Assessment & Plan:  Increase Xanax to 1 mg tid prn. Get labs.

## 2011-06-12 NOTE — Telephone Encounter (Signed)
Pt came in for a office visit on 06/12/11 and it was handled then.

## 2011-06-13 LAB — HEPATIC FUNCTION PANEL
ALT: 60 U/L — ABNORMAL HIGH (ref 0–53)
Bilirubin, Direct: 0.1 mg/dL (ref 0.0–0.3)
Total Bilirubin: 0.5 mg/dL (ref 0.3–1.2)

## 2011-06-15 ENCOUNTER — Telehealth: Payer: Self-pay | Admitting: Family Medicine

## 2011-06-15 NOTE — Telephone Encounter (Signed)
Left voice message for pt to return my call.

## 2011-06-15 NOTE — Telephone Encounter (Signed)
Message copied by Baldemar Friday on Fri Jun 15, 2011 12:02 PM ------      Message from: Gershon Crane A      Created: Thu Jun 14, 2011  1:22 PM       His diabetes and cholesterol are way out of control. Change from Crestor to Lipitor 80 mg a day, call in one year supply. Refill Metformin 1000 mg bid for one year. Add Glipizide 10 mg bid for one year. Recheck fasting labs in 90 days

## 2011-06-19 ENCOUNTER — Encounter: Payer: Self-pay | Admitting: Family Medicine

## 2011-06-19 ENCOUNTER — Telehealth: Payer: Self-pay | Admitting: Family Medicine

## 2011-06-19 MED ORDER — ATORVASTATIN CALCIUM 80 MG PO TABS: 80.0000 mg | ORAL_TABLET | Freq: Every day | ORAL | Status: DC

## 2011-06-19 MED ORDER — GLIPIZIDE 10 MG PO TABS: 10.0000 mg | ORAL_TABLET | Freq: Two times a day (BID) | ORAL | Status: DC

## 2011-06-19 NOTE — Telephone Encounter (Signed)
Message copied by Baldemar Friday on Tue Jun 19, 2011  5:33 PM ------      Message from: Gershon Crane A      Created: Thu Jun 14, 2011  1:22 PM       His diabetes and cholesterol are way out of control. Change from Crestor to Lipitor 80 mg a day, call in one year supply. Refill Metformin 1000 mg bid for one year. Add Glipizide 10 mg bid for one year. Recheck fasting labs in 90 days

## 2011-06-19 NOTE — Progress Notes (Signed)
Addended by: Aniceto Boss A on: 06/19/2011 05:33 PM   Modules accepted: Orders

## 2011-06-19 NOTE — Telephone Encounter (Signed)
Left voice message with lab results, sent 2 scripts by e-scribe and put a copy of pt's results in the mail.

## 2011-06-27 ENCOUNTER — Encounter: Payer: Self-pay | Admitting: Speech Pathology

## 2011-06-28 ENCOUNTER — Telehealth: Payer: Self-pay | Admitting: Family Medicine

## 2011-06-28 NOTE — Telephone Encounter (Signed)
Refill request for Zolpidem 10 mg take 1 po qhs and pt last here on 06/12/11.

## 2011-06-28 NOTE — Telephone Encounter (Signed)
Call in 6 month supply  

## 2011-06-29 MED ORDER — ZOLPIDEM TARTRATE 10 MG PO TABS: 10.0000 mg | ORAL_TABLET | Freq: Every evening | ORAL | Status: DC | PRN

## 2011-06-29 NOTE — Telephone Encounter (Signed)
rx sent into pharmacy

## 2011-07-01 ENCOUNTER — Other Ambulatory Visit: Payer: Self-pay | Admitting: Family Medicine

## 2011-07-15 ENCOUNTER — Other Ambulatory Visit: Payer: Self-pay | Admitting: Internal Medicine

## 2011-08-02 ENCOUNTER — Telehealth: Payer: Self-pay | Admitting: Family Medicine

## 2011-08-02 MED ORDER — LISINOPRIL-HYDROCHLOROTHIAZIDE 20-12.5 MG PO TABS: 1.0000 | ORAL_TABLET | Freq: Two times a day (BID) | ORAL | Status: DC

## 2011-08-02 NOTE — Telephone Encounter (Signed)
Script sent e-scribe 

## 2011-08-15 ENCOUNTER — Encounter: Payer: Self-pay | Admitting: Gastroenterology

## 2011-08-28 ENCOUNTER — Other Ambulatory Visit: Payer: Self-pay | Admitting: Family Medicine

## 2011-08-28 ENCOUNTER — Other Ambulatory Visit: Payer: Self-pay | Admitting: Cardiology

## 2011-09-10 ENCOUNTER — Telehealth: Payer: Self-pay | Admitting: Family Medicine

## 2011-09-10 NOTE — Telephone Encounter (Signed)
Refill request for Vicodin 5-500 mg take 1 po q6hrs prn and pt last here on 06/12/11.

## 2011-09-11 MED ORDER — HYDROCODONE-ACETAMINOPHEN 5-500 MG PO TABS: 1.0000 | ORAL_TABLET | Freq: Four times a day (QID) | ORAL | Status: DC | PRN

## 2011-09-11 NOTE — Telephone Encounter (Signed)
Call in #60 with 5 rf 

## 2011-09-11 NOTE — Telephone Encounter (Signed)
Rx called in 

## 2011-09-17 ENCOUNTER — Telehealth: Payer: Self-pay | Admitting: Family Medicine

## 2011-09-17 MED ORDER — AMLODIPINE BESYLATE 10 MG PO TABS: 10.0000 mg | ORAL_TABLET | Freq: Every day | ORAL | Status: DC

## 2011-09-17 NOTE — Telephone Encounter (Signed)
Script sent e-scribe 

## 2011-11-27 ENCOUNTER — Telehealth: Payer: Self-pay | Admitting: Family Medicine

## 2011-11-27 MED ORDER — ZOLPIDEM TARTRATE 10 MG PO TABS: 10.0000 mg | ORAL_TABLET | Freq: Every evening | ORAL | Status: DC | PRN

## 2011-11-27 NOTE — Telephone Encounter (Signed)
Refill request for Zolpidem Tartrate 10 mg take 1 po qhs prn and pt last here on 06/12/11. 

## 2011-11-27 NOTE — Telephone Encounter (Signed)
Can refill x 1   30 pills   Further refills by PCP who is out of office this week.

## 2011-11-27 NOTE — Telephone Encounter (Signed)
Script called in

## 2011-12-04 ENCOUNTER — Telehealth: Payer: Self-pay | Admitting: Family Medicine

## 2011-12-04 NOTE — Telephone Encounter (Signed)
Refill request for Alprazolam 1 mg take 1 po tid prn and pt last here on 06/12/11.

## 2011-12-06 NOTE — Telephone Encounter (Signed)
Call in #90 with 5 rf 

## 2011-12-07 MED ORDER — ALPRAZOLAM 1 MG PO TABS: 1.0000 mg | ORAL_TABLET | Freq: Three times a day (TID) | ORAL | Status: DC | PRN

## 2011-12-07 NOTE — Telephone Encounter (Signed)
Script called in

## 2011-12-18 ENCOUNTER — Other Ambulatory Visit: Payer: Self-pay | Admitting: Cardiology

## 2011-12-20 ENCOUNTER — Other Ambulatory Visit: Payer: Self-pay | Admitting: Cardiology

## 2011-12-24 ENCOUNTER — Telehealth: Payer: Self-pay | Admitting: Family Medicine

## 2011-12-24 NOTE — Telephone Encounter (Signed)
Refill request for Hydrocodon/Acetaminophen 5-500 mg take 1 po q6hrs prn and pt last here on 06/12/11.

## 2011-12-26 MED ORDER — HYDROCODONE-ACETAMINOPHEN 5-500 MG PO TABS: 1.0000 | ORAL_TABLET | Freq: Four times a day (QID) | ORAL | Status: DC | PRN

## 2011-12-26 NOTE — Telephone Encounter (Signed)
Script called in

## 2011-12-26 NOTE — Telephone Encounter (Signed)
Call in #60 with 5 rf 

## 2011-12-28 ENCOUNTER — Encounter: Payer: Self-pay | Admitting: Family Medicine

## 2011-12-28 ENCOUNTER — Ambulatory Visit (INDEPENDENT_AMBULATORY_CARE_PROVIDER_SITE_OTHER): Payer: Managed Care, Other (non HMO) | Admitting: Family Medicine

## 2011-12-28 ENCOUNTER — Telehealth: Payer: Self-pay | Admitting: *Deleted

## 2011-12-28 VITALS — BP 140/90 | HR 83 | Temp 98.0°F | Wt 252.0 lb

## 2011-12-28 DIAGNOSIS — E785 Hyperlipidemia, unspecified: Secondary | ICD-10-CM

## 2011-12-28 DIAGNOSIS — I10 Essential (primary) hypertension: Secondary | ICD-10-CM

## 2011-12-28 DIAGNOSIS — E119 Type 2 diabetes mellitus without complications: Secondary | ICD-10-CM

## 2011-12-28 DIAGNOSIS — I2510 Atherosclerotic heart disease of native coronary artery without angina pectoris: Secondary | ICD-10-CM

## 2011-12-28 NOTE — Progress Notes (Signed)
  Subjective:    Patient ID: Robert Huang, male    DOB: August 01, 1961, 51 y.o.   MRN: [id number]  HPI Here for follow up. He says he had been doing well with diet and exercise until the past few months, when he started to eat all the wrong things. He has put on 15 lbs, and he feels tired at times. His BP had been stable but has crept up a bit. His glucoses had been well controlled as well until lately. He feels fine.    Review of Systems  Constitutional: Negative.   Respiratory: Negative.   Cardiovascular: Negative.        Objective:   Physical Exam  Constitutional:       overweight  Neck: No thyromegaly present.  Cardiovascular: Normal rate, regular rhythm, normal heart sounds and intact distal pulses.   Pulmonary/Chest: Effort normal and breath sounds normal.  Lymphadenopathy:    He has no cervical adenopathy.          Assessment & Plan:  Get fasting labs today. We spoke of how he needs to lose weight and he agrees. He is set to see Dr. [name] in 2 weeks.

## 2011-12-28 NOTE — Telephone Encounter (Signed)
Called patient and left a message. He needs to come back for lab work.

## 2012-01-16 ENCOUNTER — Encounter: Payer: Managed Care, Other (non HMO) | Admitting: Cardiology

## 2012-01-16 NOTE — Progress Notes (Signed)
HPI: Mr. Marquard is a  gentleman with a history of coronary artery disease,  hypertension, diabetes, and hyperlipidemia.  He had a drug-eluting stent to his circumflex and a drug-eluting stent to his right coronary artery in March of 2010. His LV function is normal. Myoview in December 2010 showed an ejection fraction of 57% and normal perfusion. Abdominal ultrasound in December 2010 showed no aneurysm and no renal artery stenosis. I last saw him in May of 2011. Since then   Current Outpatient Prescriptions  Medication Sig Dispense Refill  . ALPRAZolam (XANAX) 1 MG tablet Take 1 tablet (1 mg total) by mouth 3 (three) times daily as needed for anxiety.  90 tablet  5  . amLODipine (NORVASC) 10 MG tablet Take 1 tablet (10 mg total) by mouth daily.  30 tablet  11  . aspirin 325 MG tablet Take 325 mg by mouth daily.        [name] atorvastatin (LIPITOR) 80 MG tablet Take 1 tablet (80 mg total) by mouth daily.  30 tablet  11  . cloNIDine (CATAPRES) 0.3 MG tablet TAKE ONE TABLET BY MOUTH TWICE A DAY  60 tablet  12  . clopidogrel (PLAVIX) 75 MG tablet TAKE 1 TABLET BY MOUTH EVERY DAY  30 tablet  6  . fenofibrate 160 MG tablet TAKE 1 TABLET EVERY DAY  30 tablet  11  . glipiZIDE (GLUCOTROL) 10 MG tablet Take 1 tablet (10 mg total) by mouth 2 (two) times daily.  60 tablet  11  . HYDROcodone-acetaminophen (VICODIN) 5-500 MG per tablet Take 1 tablet by mouth every 6 (six) hours as needed.  60 tablet  5  . indomethacin (INDOCIN SR) 75 MG CR capsule Take 1 capsule (75 mg total) by mouth as needed.  60 capsule  11  . Lancets (FREESTYLE) lancets 1 each by Other route as needed. Use as instructed       . levothyroxine (SYNTHROID, LEVOTHROID) 112 MCG tablet Take 1 tablet (112 mcg total) by mouth daily.  30 tablet  11  . lisinopril-hydrochlorothiazide (PRINZIDE,ZESTORETIC) 20-12.5 MG per tablet Take 1 tablet by mouth 2 (two) times daily at 10 AM and 5 PM.  60 tablet  11  . metFORMIN (GLUCOPHAGE) 1000 MG tablet TAKE 1  TABLET TWICE DAILY  60 tablet  11  . metoprolol succinate (TOPROL-XL) 100 MG 24 hr tablet TAKE 1 TABLET TWICE DAILY  60 tablet  5  . NITROSTAT 0.4 MG SL tablet PLACE 1 TABLET UNDER TONGUE EVERY 5 MINUTES AS NEEDED FOR CHEST PAIN. MAY REPEAT 3 TIMES.  25 tablet  2  . zolpidem (AMBIEN) 10 MG tablet Take 1 tablet (10 mg total) by mouth at bedtime as needed.  30 tablet  1     Past Medical History  Diagnosis Date  . Hyperlipidemia   . Hypertension   . CAD (coronary artery disease)   . DJD (degenerative joint disease)   . Hypothyroid   . Gout   . Anxiety   . Osteoarthritis   . Diabetes mellitus   . Renal calculus     hx    No past surgical history on file.  History   Social History  . Marital Status: Married    Spouse Name: N/A    Number of Children: N/A  . Years of Education: N/A   Occupational History  . Not on file.   Social History Main Topics  . Smoking status: Never Smoker   . Smokeless tobacco: Current User  Types: Chew  . Alcohol Use: 1.0 oz/week    2 drink(s) per week  . Drug Use: No  . Sexually Active: Not on file   Other Topics Concern  . Not on file   Social History Narrative  . No narrative on file    ROS: no fevers or chills, productive cough, hemoptysis, dysphasia, odynophagia, melena, hematochezia, dysuria, hematuria, rash, seizure activity, orthopnea, PND, pedal edema, claudication. Remaining systems are negative.  Physical Exam: Well-developed well-nourished in no acute distress.  Skin is warm and dry.  HEENT is normal.  Neck is supple. No thyromegaly.  Chest is clear to auscultation with normal expansion.  Cardiovascular exam is regular rate and rhythm.  Abdominal exam nontender or distended. No masses palpated. Extremities show no edema. neuro grossly intact  ECG     This encounter was created in error - please disregard.

## 2012-01-18 ENCOUNTER — Telehealth: Payer: Self-pay | Admitting: Family Medicine

## 2012-01-18 NOTE — Telephone Encounter (Signed)
Refill request for Zolpidem Tartrate 10 mg take 1 po qhs prn and pt last here on 12/28/11.

## 2012-01-22 MED ORDER — ZOLPIDEM TARTRATE 10 MG PO TABS: 10.0000 mg | ORAL_TABLET | Freq: Every evening | ORAL | Status: DC | PRN

## 2012-01-22 NOTE — Telephone Encounter (Signed)
I called in script 

## 2012-01-22 NOTE — Telephone Encounter (Signed)
Call in #30 with 5 rf 

## 2012-02-13 ENCOUNTER — Ambulatory Visit (INDEPENDENT_AMBULATORY_CARE_PROVIDER_SITE_OTHER): Payer: Managed Care, Other (non HMO) | Admitting: Cardiology

## 2012-02-13 ENCOUNTER — Encounter: Payer: Self-pay | Admitting: Cardiology

## 2012-02-13 VITALS — BP 148/82 | HR 61 | Ht 73.0 in | Wt 249.1 lb

## 2012-02-13 DIAGNOSIS — R079 Chest pain, unspecified: Secondary | ICD-10-CM

## 2012-02-13 DIAGNOSIS — I10 Essential (primary) hypertension: Secondary | ICD-10-CM

## 2012-02-13 DIAGNOSIS — I2510 Atherosclerotic heart disease of native coronary artery without angina pectoris: Secondary | ICD-10-CM

## 2012-02-13 DIAGNOSIS — E785 Hyperlipidemia, unspecified: Secondary | ICD-10-CM

## 2012-02-13 NOTE — Assessment & Plan Note (Signed)
Blood pressure is controlled at home by report. Continue present medications. Potassium and renal function monitored by primary care.

## 2012-02-13 NOTE — Progress Notes (Signed)
HPI: [name] is a  gentleman with a history of coronary artery disease,  hypertension, diabetes, and hyperlipidemia.  He had a drug-eluting stent to his circumflex and a drug-eluting stent to his right coronary artery in March of 2010. His LV function is normal. Myoview in December 2010 showed an ejection fraction of 57% and normal perfusion. Abdominal ultrasound in December 2010 showed no aneurysm and no renal artery stenosis. I last saw him in May of 2011. Since then, the patient has dyspnea with more extreme activities but not with routine activities. It is relieved with rest. It is not associated with chest pain. There is no orthopnea, PND or pedal edema. There is no syncope or palpitations. There is no exertional chest pain. Occasional ache in left upper chest not related to exertion.    Current Outpatient Prescriptions  Medication Sig Dispense Refill  . ALPRAZolam (XANAX) 1 MG tablet Take 1 tablet (1 mg total) by mouth 3 (three) times daily as needed for anxiety.  90 tablet  5  . amLODipine (NORVASC) 10 MG tablet Take 1 tablet (10 mg total) by mouth daily.  30 tablet  11  . aspirin 325 MG tablet Take 325 mg by mouth daily.        [name] atorvastatin (LIPITOR) 80 MG tablet Take 1 tablet (80 mg total) by mouth daily.  30 tablet  11  . cloNIDine (CATAPRES) 0.3 MG tablet TAKE ONE TABLET BY MOUTH TWICE A DAY  60 tablet  12  . clopidogrel (PLAVIX) 75 MG tablet TAKE 1 TABLET BY MOUTH EVERY DAY  30 tablet  6  . fenofibrate 160 MG tablet TAKE 1 TABLET EVERY DAY  30 tablet  11  . glipiZIDE (GLUCOTROL) 10 MG tablet Take 1 tablet (10 mg total) by mouth 2 (two) times daily.  60 tablet  11  . HYDROcodone-acetaminophen (VICODIN) 5-500 MG per tablet Take 1 tablet by mouth every 6 (six) hours as needed.  60 tablet  5  . indomethacin (INDOCIN SR) 75 MG CR capsule Take 1 capsule (75 mg total) by mouth as needed.  60 capsule  11  . Lancets (FREESTYLE) lancets 1 each by Other route as needed. Use as  instructed       . levothyroxine (SYNTHROID, LEVOTHROID) 112 MCG tablet Take 1 tablet (112 mcg total) by mouth daily.  30 tablet  11  . lisinopril-hydrochlorothiazide (PRINZIDE,ZESTORETIC) 20-12.5 MG per tablet Take 1 tablet by mouth 2 (two) times daily at 10 AM and 5 PM.  60 tablet  11  . metFORMIN (GLUCOPHAGE) 1000 MG tablet TAKE 1 TABLET TWICE DAILY  60 tablet  11  . metoprolol succinate (TOPROL-XL) 100 MG 24 hr tablet TAKE 1 TABLET TWICE DAILY  60 tablet  5  . NITROSTAT 0.4 MG SL tablet PLACE 1 TABLET UNDER TONGUE EVERY 5 MINUTES AS NEEDED FOR CHEST PAIN. MAY REPEAT 3 TIMES.  25 tablet  2  . zolpidem (AMBIEN) 10 MG tablet Take 1 tablet (10 mg total) by mouth at bedtime as needed.  30 tablet  5     Past Medical History  Diagnosis Date  . Hyperlipidemia   . Hypertension   . CAD (coronary artery disease)   . DJD (degenerative joint disease)   . Hypothyroid   . Gout   . Anxiety   . Osteoarthritis   . Diabetes mellitus   . Renal calculus     hx    No past surgical history on file.  History  Social History  . Marital Status: Married    Spouse Name: N/A    Number of Children: N/A  . Years of Education: N/A   Occupational History  . Not on file.   Social History Main Topics  . Smoking status: Never Smoker   . Smokeless tobacco: Current User    Types: Chew  . Alcohol Use: 1.0 oz/week    2 drink(s) per week  . Drug Use: No  . Sexually Active: Not on file   Other Topics Concern  . Not on file   Social History Narrative  . No narrative on file    ROS: no fevers or chills, productive cough, hemoptysis, dysphasia, odynophagia, melena, hematochezia, dysuria, hematuria, rash, seizure activity, orthopnea, PND, pedal edema, claudication. Remaining systems are negative.  Physical Exam: Well-developed well-nourished in no acute distress.  Skin is warm and dry.  HEENT is normal.  Neck is supple.  Chest is clear to auscultation with normal expansion.  Cardiovascular exam  is regular rate and rhythm.  Abdominal exam nontender or distended. No masses palpated. Extremities show no edema. neuro grossly intact  ECG sinus rhythm at a rate of 61. Left ventricular hypertrophy. Nonspecific ST changes.

## 2012-02-13 NOTE — Patient Instructions (Addendum)
Your physician wants you to follow-up in: ONE YEAR WITH DR CRENSHAW IN [place] You will receive a reminder letter in the mail two months in advance. If you don't receive a letter, please call our office to schedule the follow-up appointment.   Your physician has requested that you have en exercise stress myoview. For further information please visit [email or web address]. Please follow instruction sheet, as given.   

## 2012-02-13 NOTE — Assessment & Plan Note (Signed)
Continue aspirin and statin. 

## 2012-02-13 NOTE — Assessment & Plan Note (Signed)
Continue present medications. Lipids and liver monitored by primary care. 

## 2012-02-13 NOTE — Assessment & Plan Note (Signed)
Symptoms atypical. Schedule Myoview risk stratification. 

## 2012-02-19 ENCOUNTER — Encounter (HOSPITAL_COMMUNITY): Payer: Managed Care, Other (non HMO)

## 2012-03-08 ENCOUNTER — Other Ambulatory Visit: Payer: Self-pay | Admitting: Cardiology

## 2012-04-08 ENCOUNTER — Other Ambulatory Visit: Payer: Self-pay

## 2012-04-08 NOTE — Telephone Encounter (Signed)
[facility] request [facility] [address] for vicodin 5-500 Last seen 5/3/3 rov Last written 12/26/11 #60 5RF Please advise

## 2012-04-08 NOTE — Telephone Encounter (Signed)
Call in #120 with 5 rf 

## 2012-04-09 MED ORDER — HYDROCODONE-ACETAMINOPHEN 5-500 MG PO TABS: 1.0000 | ORAL_TABLET | Freq: Four times a day (QID) | ORAL | Status: DC | PRN

## 2012-04-09 NOTE — Telephone Encounter (Signed)
Called in.

## 2012-04-19 ENCOUNTER — Other Ambulatory Visit: Payer: Self-pay | Admitting: Cardiology

## 2012-05-16 ENCOUNTER — Other Ambulatory Visit: Payer: Self-pay | Admitting: Cardiology

## 2012-05-19 ENCOUNTER — Encounter (HOSPITAL_COMMUNITY): Payer: Self-pay | Admitting: Anesthesiology

## 2012-05-19 ENCOUNTER — Ambulatory Visit: Admit: 2012-05-19 | Payer: Self-pay | Admitting: Orthopedic Surgery

## 2012-05-19 ENCOUNTER — Encounter (HOSPITAL_COMMUNITY)
Admission: EM | Disposition: A | Discharge status: Home / Self Care | Payer: Self-pay | Source: Home / Self Care | Attending: Orthopedic Surgery

## 2012-05-19 ENCOUNTER — Encounter (HOSPITAL_COMMUNITY): Payer: Self-pay | Admitting: General Practice

## 2012-05-19 ENCOUNTER — Encounter (HOSPITAL_COMMUNITY): Payer: Self-pay | Admitting: Emergency Medicine

## 2012-05-19 ENCOUNTER — Emergency Department (HOSPITAL_COMMUNITY): Payer: Managed Care, Other (non HMO)

## 2012-05-19 ENCOUNTER — Inpatient Hospital Stay (HOSPITAL_COMMUNITY)
Admission: EM | Admit: 2012-05-19 | Discharge: 2012-05-20 | DRG: 505 | Disposition: A | Discharge status: Home / Self Care | Payer: Managed Care, Other (non HMO) | Attending: Orthopedic Surgery | Admitting: Orthopedic Surgery

## 2012-05-19 ENCOUNTER — Inpatient Hospital Stay (HOSPITAL_COMMUNITY): Payer: Managed Care, Other (non HMO) | Admitting: Anesthesiology

## 2012-05-19 DIAGNOSIS — IMO0002 Reserved for concepts with insufficient information to code with codable children: Secondary | ICD-10-CM | POA: Diagnosis present

## 2012-05-19 DIAGNOSIS — Y998 Other external cause status: Secondary | ICD-10-CM

## 2012-05-19 DIAGNOSIS — E669 Obesity, unspecified: Secondary | ICD-10-CM | POA: Diagnosis present

## 2012-05-19 DIAGNOSIS — I2510 Atherosclerotic heart disease of native coronary artery without angina pectoris: Secondary | ICD-10-CM | POA: Diagnosis present

## 2012-05-19 DIAGNOSIS — E039 Hypothyroidism, unspecified: Secondary | ICD-10-CM | POA: Diagnosis present

## 2012-05-19 DIAGNOSIS — L97509 Non-pressure chronic ulcer of other part of unspecified foot with unspecified severity: Secondary | ICD-10-CM | POA: Diagnosis present

## 2012-05-19 DIAGNOSIS — F17200 Nicotine dependence, unspecified, uncomplicated: Secondary | ICD-10-CM | POA: Diagnosis present

## 2012-05-19 DIAGNOSIS — Z87442 Personal history of urinary calculi: Secondary | ICD-10-CM

## 2012-05-19 DIAGNOSIS — M109 Gout, unspecified: Secondary | ICD-10-CM | POA: Diagnosis present

## 2012-05-19 DIAGNOSIS — S92401B Displaced unspecified fracture of right great toe, initial encounter for open fracture: Secondary | ICD-10-CM

## 2012-05-19 DIAGNOSIS — S92919B Unspecified fracture of unspecified toe(s), initial encounter for open fracture: Principal | ICD-10-CM | POA: Diagnosis present

## 2012-05-19 DIAGNOSIS — I10 Essential (primary) hypertension: Secondary | ICD-10-CM | POA: Diagnosis present

## 2012-05-19 DIAGNOSIS — Y92009 Unspecified place in unspecified non-institutional (private) residence as the place of occurrence of the external cause: Secondary | ICD-10-CM

## 2012-05-19 DIAGNOSIS — M1990 Unspecified osteoarthritis, unspecified site: Secondary | ICD-10-CM | POA: Diagnosis present

## 2012-05-19 DIAGNOSIS — E119 Type 2 diabetes mellitus without complications: Secondary | ICD-10-CM | POA: Diagnosis present

## 2012-05-19 HISTORY — PX: I & D EXTREMITY: SHX5045

## 2012-05-19 HISTORY — PX: OTHER SURGICAL HISTORY: SHX169

## 2012-05-19 LAB — GLUCOSE, CAPILLARY
Glucose-Capillary: 121 mg/dL — ABNORMAL HIGH (ref 70–99)
Glucose-Capillary: 165 mg/dL — ABNORMAL HIGH (ref 70–99)

## 2012-05-19 SURGERY — IRRIGATION AND DEBRIDEMENT EXTREMITY
Anesthesia: General | Site: Toe | Laterality: Right | Wound class: Contaminated

## 2012-05-19 MED ORDER — CLONIDINE HCL 0.3 MG PO TABS
0.3000 mg | ORAL_TABLET | Freq: Two times a day (BID) | ORAL | Status: DC
  Administered 2012-05-19 (×2): 0.3 mg via ORAL
  Filled 2012-05-19 (×4): qty 1

## 2012-05-19 MED ORDER — PROPOFOL 10 MG/ML IV BOLUS
INTRAVENOUS | Status: DC | PRN
  Administered 2012-05-19: 180 mg via INTRAVENOUS

## 2012-05-19 MED ORDER — ALPRAZOLAM 0.5 MG PO TABS: 1.0000 mg | ORAL_TABLET | Freq: Three times a day (TID) | ORAL | Status: DC | PRN

## 2012-05-19 MED ORDER — FENTANYL CITRATE 0.05 MG/ML IJ SOLN
INTRAMUSCULAR | Status: DC | PRN
  Administered 2012-05-19: 50 ug via INTRAVENOUS
  Administered 2012-05-19: 100 ug via INTRAVENOUS
  Administered 2012-05-19 (×2): 50 ug via INTRAVENOUS

## 2012-05-19 MED ORDER — ASPIRIN 325 MG PO TABS
325.0000 mg | ORAL_TABLET | Freq: Every day | ORAL | Status: DC
  Administered 2012-05-19: 325 mg via ORAL
  Filled 2012-05-19 (×2): qty 1

## 2012-05-19 MED ORDER — CEFAZOLIN SODIUM-DEXTROSE 2-3 GM-% IV SOLR
2.0000 g | Freq: Four times a day (QID) | INTRAVENOUS | Status: AC
  Administered 2012-05-19 (×3): 2 g via INTRAVENOUS
  Filled 2012-05-19 (×3): qty 50

## 2012-05-19 MED ORDER — ENOXAPARIN SODIUM 40 MG/0.4ML ~~LOC~~ SOLN
40.0000 mg | SUBCUTANEOUS | Status: DC
  Administered 2012-05-20: 40 mg via SUBCUTANEOUS
  Filled 2012-05-19 (×2): qty 0.4

## 2012-05-19 MED ORDER — SODIUM CHLORIDE 0.45 % IV SOLN
INTRAVENOUS | Status: DC
  Administered 2012-05-19: 06:00:00 via INTRAVENOUS

## 2012-05-19 MED ORDER — AMLODIPINE BESYLATE 10 MG PO TABS
10.0000 mg | ORAL_TABLET | Freq: Every day | ORAL | Status: DC
  Administered 2012-05-19: 10 mg via ORAL
  Filled 2012-05-19 (×2): qty 1

## 2012-05-19 MED ORDER — FENOFIBRATE 160 MG PO TABS
160.0000 mg | ORAL_TABLET | Freq: Every day | ORAL | Status: DC
  Administered 2012-05-19: 160 mg via ORAL
  Filled 2012-05-19 (×2): qty 1

## 2012-05-19 MED ORDER — LISINOPRIL 20 MG PO TABS
20.0000 mg | ORAL_TABLET | Freq: Every day | ORAL | Status: DC
  Administered 2012-05-19: 20 mg via ORAL
  Filled 2012-05-19 (×2): qty 1

## 2012-05-19 MED ORDER — DOCUSATE SODIUM 100 MG PO CAPS
100.0000 mg | ORAL_CAPSULE | Freq: Two times a day (BID) | ORAL | Status: DC
  Administered 2012-05-19 (×2): 100 mg via ORAL
  Filled 2012-05-19 (×2): qty 1

## 2012-05-19 MED ORDER — SODIUM CHLORIDE 0.9 % IR SOLN
Status: DC | PRN
  Administered 2012-05-19: 3000 mL

## 2012-05-19 MED ORDER — ZOLPIDEM TARTRATE 10 MG PO TABS: 10.0000 mg | ORAL_TABLET | Freq: Every evening | ORAL | Status: DC | PRN

## 2012-05-19 MED ORDER — HYDROMORPHONE HCL PF 1 MG/ML IJ SOLN: 0.2500 mg | INTRAMUSCULAR | Status: DC | PRN

## 2012-05-19 MED ORDER — ONDANSETRON HCL 4 MG/2ML IJ SOLN
INTRAMUSCULAR | Status: DC | PRN
  Administered 2012-05-19: 4 mg via INTRAVENOUS

## 2012-05-19 MED ORDER — LEVOTHYROXINE SODIUM 112 MCG PO TABS
112.0000 ug | ORAL_TABLET | Freq: Every day | ORAL | Status: DC
  Administered 2012-05-19: 112 ug via ORAL
  Filled 2012-05-19 (×5): qty 1

## 2012-05-19 MED ORDER — LIDOCAINE HCL (CARDIAC) 20 MG/ML IV SOLN
INTRAVENOUS | Status: DC | PRN
  Administered 2012-05-19: 80 mg via INTRAVENOUS

## 2012-05-19 MED ORDER — MIDAZOLAM HCL 2 MG/2ML IJ SOLN: 1.0000 mg | INTRAMUSCULAR | Status: DC | PRN

## 2012-05-19 MED ORDER — FREESTYLE LANCETS MISC: 1.0000 | Status: DC | PRN

## 2012-05-19 MED ORDER — SODIUM CHLORIDE 0.9 % IV SOLN
INTRAVENOUS | Status: DC | PRN
  Administered 2012-05-19: 04:00:00 via INTRAVENOUS

## 2012-05-19 MED ORDER — MIDAZOLAM HCL 5 MG/5ML IJ SOLN
INTRAMUSCULAR | Status: DC | PRN
  Administered 2012-05-19: 2 mg via INTRAVENOUS

## 2012-05-19 MED ORDER — METOCLOPRAMIDE HCL 10 MG PO TABS: 5.0000 mg | ORAL_TABLET | Freq: Three times a day (TID) | ORAL | Status: DC | PRN

## 2012-05-19 MED ORDER — CEFAZOLIN SODIUM-DEXTROSE 2-3 GM-% IV SOLR
INTRAVENOUS | Status: DC | PRN
  Administered 2012-05-19: 2 g via INTRAVENOUS

## 2012-05-19 MED ORDER — PROMETHAZINE HCL 25 MG/ML IJ SOLN: 6.2500 mg | INTRAMUSCULAR | Status: DC | PRN

## 2012-05-19 MED ORDER — DEXAMETHASONE SODIUM PHOSPHATE 4 MG/ML IJ SOLN
INTRAMUSCULAR | Status: DC | PRN
  Administered 2012-05-19: 4 mg via INTRAVENOUS

## 2012-05-19 MED ORDER — DEXTROSE 5 % IV SOLN
INTRAVENOUS | Status: DC | PRN
  Administered 2012-05-19: 05:00:00 via INTRAVENOUS

## 2012-05-19 MED ORDER — METFORMIN HCL ER 500 MG PO TB24
1000.0000 mg | ORAL_TABLET | Freq: Two times a day (BID) | ORAL | Status: DC
  Administered 2012-05-19 – 2012-05-20 (×3): 1000 mg via ORAL
  Filled 2012-05-19 (×7): qty 2

## 2012-05-19 MED ORDER — ONDANSETRON HCL 4 MG/2ML IJ SOLN: 4.0000 mg | Freq: Four times a day (QID) | INTRAMUSCULAR | Status: DC | PRN

## 2012-05-19 MED ORDER — LACTATED RINGERS IV SOLN: INTRAVENOUS | Status: DC | PRN

## 2012-05-19 MED ORDER — METOCLOPRAMIDE HCL 5 MG/ML IJ SOLN: 5.0000 mg | Freq: Three times a day (TID) | INTRAMUSCULAR | Status: DC | PRN

## 2012-05-19 MED ORDER — HYDROCODONE-ACETAMINOPHEN 5-325 MG PO TABS: 1.0000 | ORAL_TABLET | ORAL | Status: DC | PRN

## 2012-05-19 MED ORDER — HYDROCHLOROTHIAZIDE 12.5 MG PO CAPS
12.5000 mg | ORAL_CAPSULE | Freq: Every day | ORAL | Status: DC
  Administered 2012-05-19: 12.5 mg via ORAL
  Filled 2012-05-19 (×2): qty 1

## 2012-05-19 MED ORDER — OXYCODONE-ACETAMINOPHEN 5-325 MG PO TABS
1.0000 | ORAL_TABLET | ORAL | Status: DC | PRN
  Administered 2012-05-19 – 2012-05-20 (×3): 2 via ORAL
  Filled 2012-05-19 (×3): qty 2

## 2012-05-19 MED ORDER — CLOPIDOGREL BISULFATE 75 MG PO TABS
75.0000 mg | ORAL_TABLET | Freq: Every day | ORAL | Status: DC
  Administered 2012-05-19: 75 mg via ORAL
  Filled 2012-05-19 (×4): qty 1

## 2012-05-19 MED ORDER — FENTANYL CITRATE 0.05 MG/ML IJ SOLN: 50.0000 ug | Freq: Once | INTRAMUSCULAR | Status: DC

## 2012-05-19 MED ORDER — ONDANSETRON HCL 4 MG PO TABS: 4.0000 mg | ORAL_TABLET | Freq: Four times a day (QID) | ORAL | Status: DC | PRN

## 2012-05-19 MED ORDER — ATORVASTATIN CALCIUM 80 MG PO TABS
80.0000 mg | ORAL_TABLET | Freq: Every day | ORAL | Status: DC
  Administered 2012-05-19: 80 mg via ORAL
  Filled 2012-05-19 (×2): qty 1

## 2012-05-19 MED ORDER — GLIPIZIDE 10 MG PO TABS
10.0000 mg | ORAL_TABLET | Freq: Two times a day (BID) | ORAL | Status: DC
  Administered 2012-05-19 (×2): 10 mg via ORAL
  Filled 2012-05-19 (×7): qty 1

## 2012-05-19 MED ORDER — METOPROLOL SUCCINATE ER 100 MG PO TB24
100.0000 mg | ORAL_TABLET | Freq: Two times a day (BID) | ORAL | Status: DC
  Administered 2012-05-19 (×2): 100 mg via ORAL
  Filled 2012-05-19 (×4): qty 1

## 2012-05-19 MED ORDER — NITROGLYCERIN 0.4 MG SL SUBL: 0.4000 mg | SUBLINGUAL_TABLET | SUBLINGUAL | Status: DC | PRN

## 2012-05-19 MED ORDER — HYDROMORPHONE HCL PF 1 MG/ML IJ SOLN
0.5000 mg | INTRAMUSCULAR | Status: DC | PRN
  Administered 2012-05-19 (×2): 1 mg via INTRAVENOUS
  Filled 2012-05-19 (×2): qty 1

## 2012-05-19 MED ORDER — ONDANSETRON HCL 4 MG/2ML IJ SOLN
4.0000 mg | Freq: Once | INTRAMUSCULAR | Status: AC
  Administered 2012-05-19: 4 mg via INTRAVENOUS
  Filled 2012-05-19: qty 2

## 2012-05-19 MED ORDER — METOCLOPRAMIDE HCL 5 MG/ML IJ SOLN
INTRAMUSCULAR | Status: DC | PRN
  Administered 2012-05-19: 10 mg via INTRAVENOUS

## 2012-05-19 MED ORDER — LISINOPRIL-HYDROCHLOROTHIAZIDE 20-12.5 MG PO TABS: 1.0000 | ORAL_TABLET | Freq: Two times a day (BID) | ORAL | Status: DC

## 2012-05-19 MED ORDER — HYDROMORPHONE HCL PF 1 MG/ML IJ SOLN
1.0000 mg | Freq: Once | INTRAMUSCULAR | Status: AC
  Administered 2012-05-19: 1 mg via INTRAVENOUS
  Filled 2012-05-19: qty 1

## 2012-05-19 SURGICAL SUPPLY — 50 items
BANDAGE ELASTIC 6 VELCRO ST LF (GAUZE/BANDAGES/DRESSINGS) ×2 IMPLANT
BANDAGE GAUZE ELAST BULKY 4 IN (GAUZE/BANDAGES/DRESSINGS) ×2 IMPLANT
BLADE SURG 10 STRL SS (BLADE) IMPLANT
BNDG COHESIVE 4X5 TAN STRL (GAUZE/BANDAGES/DRESSINGS) ×2 IMPLANT
BNDG COHESIVE 6X5 TAN STRL LF (GAUZE/BANDAGES/DRESSINGS) ×2 IMPLANT
BNDG GAUZE STRTCH 6 (GAUZE/BANDAGES/DRESSINGS) IMPLANT
CANISTER SUCTION 2500CC (MISCELLANEOUS) ×4 IMPLANT
CAP PIN PROTECTOR ORTHO WHT (CAP) ×2 IMPLANT
CLOTH BEACON ORANGE TIMEOUT ST (SAFETY) ×2 IMPLANT
COTTON STERILE ROLL (GAUZE/BANDAGES/DRESSINGS) ×2 IMPLANT
COVER SURGICAL LIGHT HANDLE (MISCELLANEOUS) ×2 IMPLANT
CUFF TOURNIQUET SINGLE 18IN (TOURNIQUET CUFF) IMPLANT
CUFF TOURNIQUET SINGLE 24IN (TOURNIQUET CUFF) IMPLANT
CUFF TOURNIQUET SINGLE 34IN LL (TOURNIQUET CUFF) IMPLANT
CUFF TOURNIQUET SINGLE 44IN (TOURNIQUET CUFF) IMPLANT
DRAPE U-SHAPE 47X51 STRL (DRAPES) ×2 IMPLANT
DRSG ADAPTIC 3X8 NADH LF (GAUZE/BANDAGES/DRESSINGS) ×2 IMPLANT
DURAPREP 26ML APPLICATOR (WOUND CARE) IMPLANT
ELECT CAUTERY BLADE 6.4 (BLADE) ×2 IMPLANT
ELECT REM PT RETURN 9FT ADLT (ELECTROSURGICAL) ×2
ELECTRODE REM PT RTRN 9FT ADLT (ELECTROSURGICAL) ×1 IMPLANT
GAUZE XEROFORM 5X9 LF (GAUZE/BANDAGES/DRESSINGS) ×2 IMPLANT
GLOVE BIO SURGEON STRL SZ7.5 (GLOVE) IMPLANT
GLOVE BIO SURGEON STRL SZ8 (GLOVE) ×4 IMPLANT
GLOVE EUDERMIC 7 POWDERFREE (GLOVE) IMPLANT
GLOVE SS BIOGEL STRL SZ 7.5 (GLOVE) ×1 IMPLANT
GLOVE SUPERSENSE BIOGEL SZ 7.5 (GLOVE) ×1
GOWN STRL NON-REIN LRG LVL3 (GOWN DISPOSABLE) ×2 IMPLANT
GOWN STRL REIN XL XLG (GOWN DISPOSABLE) ×4 IMPLANT
HANDPIECE INTERPULSE COAX TIP (DISPOSABLE)
KIT BASIN OR (CUSTOM PROCEDURE TRAY) ×2 IMPLANT
KIT ROOM TURNOVER OR (KITS) ×2 IMPLANT
MANIFOLD NEPTUNE II (INSTRUMENTS) IMPLANT
NS IRRIG 1000ML POUR BTL (IV SOLUTION) ×2 IMPLANT
PACK ORTHO EXTREMITY (CUSTOM PROCEDURE TRAY) ×2 IMPLANT
PAD ARMBOARD 7.5X6 YLW CONV (MISCELLANEOUS) ×2 IMPLANT
PADDING CAST COTTON 6X4 STRL (CAST SUPPLIES) IMPLANT
SET CYSTO W/LG BORE CLAMP LF (SET/KITS/TRAYS/PACK) ×2 IMPLANT
SET HNDPC FAN SPRY TIP SCT (DISPOSABLE) IMPLANT
SPONGE GAUZE 4X4 12PLY (GAUZE/BANDAGES/DRESSINGS) ×2 IMPLANT
SPONGE LAP 18X18 X RAY DECT (DISPOSABLE) ×2 IMPLANT
STOCKINETTE IMPERVIOUS 9X36 MD (GAUZE/BANDAGES/DRESSINGS) IMPLANT
SUT ETHILON 3 0 PS 1 (SUTURE) ×2 IMPLANT
TOWEL OR 17X24 6PK STRL BLUE (TOWEL DISPOSABLE) ×2 IMPLANT
TOWEL OR 17X26 10 PK STRL BLUE (TOWEL DISPOSABLE) ×2 IMPLANT
TUBE ANAEROBIC SPECIMEN COL (MISCELLANEOUS) IMPLANT
TUBE CONNECTING 12X1/4 (SUCTIONS) ×2 IMPLANT
UNDERPAD 30X30 INCONTINENT (UNDERPADS AND DIAPERS) ×2 IMPLANT
WATER STERILE IRR 1000ML POUR (IV SOLUTION) IMPLANT
YANKAUER SUCT BULB TIP NO VENT (SUCTIONS) ×2 IMPLANT

## 2012-05-19 NOTE — ED Notes (Signed)
Consent for or signed and dr supple took pt to or.

## 2012-05-19 NOTE — Anesthesia Preprocedure Evaluation (Addendum)
Anesthesia Evaluation  Patient identified by MRN, date of birth, ID band Patient awake    Reviewed: Allergy & Precautions, H&P , NPO status , Patient's Chart, lab work & pertinent test results  Airway Mallampati: II TM Distance: >3 FB Neck ROM: Full    Dental  (+) Edentulous Upper and Edentulous Lower   Pulmonary  breath sounds clear to auscultation        Cardiovascular hypertension, + CAD Rhythm:Regular Rate:Normal     Neuro/Psych Anxiety    GI/Hepatic   Endo/Other  diabetesHypothyroidism   Renal/GU      Musculoskeletal   Abdominal (+) + obese,   Peds  Hematology   Anesthesia Other Findings   Reproductive/Obstetrics                          Anesthesia Physical Anesthesia Plan  ASA: III and Emergent  Anesthesia Plan: General   Post-op Pain Management:    Induction: Intravenous  Airway Management Planned: LMA  Additional Equipment:   Intra-op Plan:   Post-operative Plan: Extubation in OR  Informed Consent: I have reviewed the patients History and Physical, chart, labs and discussed the procedure including the risks, benefits and alternatives for the proposed anesthesia with the patient or authorized representative who has indicated his/her understanding and acceptance.     Plan Discussed with: CRNA and Surgeon  Anesthesia Plan Comments:         Anesthesia Quick Evaluation

## 2012-05-19 NOTE — Transfer of Care (Signed)
Immediate Anesthesia Transfer of Care Note  Patient: Robert Huang  Procedure(s) Performed: Procedure(s) (LRB) with comments: IRRIGATION AND DEBRIDEMENT EXTREMITY (Right) - Right Great toe OPEN REDUCTION INTERNAL FIXATION (ORIF) DISTAL PHALANX (Right) - Right great toe  Patient Location: PACU  Anesthesia Type: General  Level of Consciousness: awake, alert , oriented, patient cooperative and responds to stimulation  Airway & Oxygen Therapy: Patient Spontanous Breathing and Patient connected to nasal cannula oxygen  Post-op Assessment: Report given to PACU RN, Post -op Vital signs reviewed and stable, Patient moving all extremities and Patient moving all extremities X 4  Post vital signs: Reviewed and stable  Complications: No apparent anesthesia complications

## 2012-05-19 NOTE — Preoperative (Signed)
Beta Blockers   Reason not to administer Beta Blockers:Pt took Toprol 100mg  PO @2200HR  on 05/18/2012

## 2012-05-19 NOTE — Op Note (Signed)
05/19/2012  5:29 AM  PATIENT:   Robert Huang  51 y.o. male  PRE-OPERATIVE DIAGNOSIS: Open fracture dislocation Right great toe  POST-OPERATIVE DIAGNOSIS:  same  PROCEDURE:  I and D, ORIF  SURGEON:  Rania Prothero, Vania Rea M.D.  ASSISTANTS: none   ANESTHESIA:   GET  EBL: min  SPECIMEN:  none  Drains: none   PATIENT DISPOSITION:  PACU - hemodynamically stable.    PLAN OF CARE: Admit to inpatient  24hr IV abx, then complete 5 days po WBAT in [place] wedge F/u 1 week  Dictation# [id number]

## 2012-05-19 NOTE — ED Notes (Signed)
Patient advised he came home and hit a metal piece that protrudes off a door in the garage.  Patient though that at first he had stubbed his toe, however when he took his crocs off he realized his right toe was really hurt.  Patient says he always wear these croc shoes and they are unsafe.  Patient does have a sore on his right great toe as well that is unrelated to this injury.

## 2012-05-19 NOTE — Anesthesia Postprocedure Evaluation (Signed)
  Anesthesia Post-op Note  Patient: Robert Huang  Procedure(s) Performed: Procedure(s) (LRB) with comments: IRRIGATION AND DEBRIDEMENT EXTREMITY (Right) - Right Great toe OPEN REDUCTION INTERNAL FIXATION (ORIF) DISTAL PHALANX (Right) - Right great toe  Patient Location: PACU  Anesthesia Type: General  Level of Consciousness: awake and alert   Airway and Oxygen Therapy: Patient Spontanous Breathing  Post-op Pain: none  Post-op Assessment: Post-op Vital signs reviewed, Patient's Cardiovascular Status Stable, Respiratory Function Stable, Patent Airway, No signs of Nausea or vomiting and Pain level controlled  Post-op Vital Signs: stable  Complications: No apparent anesthesia complications

## 2012-05-19 NOTE — Op Note (Signed)
NAME:  Robert Huang, Robert Huang NO.:  [id number]  MEDICAL RECORD NO.:  [id number]  LOCATION:  [facility]                         FACILITY:  [facility]  PHYSICIAN:  Vania Rea. Ignacio Lowder, M.D.  DATE OF BIRTH:  12/28/1960  DATE OF PROCEDURE:  05/19/2012 DATE OF DISCHARGE:                              OPERATIVE REPORT   PREOPERATIVE DIAGNOSIS:  Open fracture dislocation of the right great toe, interphalangeal joint.  POSTOPERATIVE DIAGNOSIS:  Open fracture dislocation of the right great toe, interphalangeal joint.  PROCEDURE: 1. Exploration, irrigation, and debridement of open fracture     dislocation of the right great toe, interphalangeal joint. 2. Open reduction and internal fixation of right great toe,     interphalangeal joint fracture dislocation.  ASSISTANT:  None.  ANESTHESIA:  General endotracheal.  TOURNIQUET TIME:  None was used.  BLOOD LOSS:  Minimal.  DRAINS:  None.  HISTORY:  Mr. Meli is a 51 year old gentleman with past medical history significant for non-insulin-dependent diabetes, who was wearing a pair of "Crocs" shoes at this evening at home and apparently stubbed his toe on a metal object and had noted some initial mild discomfort, but he did not describe any severe pain but was concerned by profuse bleeding that emanated from his great toe, and despite his attempts to gain hemostasis had continued bleeding at home.  So, sought help and locally in our emergency room where on evaluation is found to have a complex transverse and dorsal laceration, just proximal to the base of the nail plate at the level of the interphalangeal jugular joint and the tip of the toe was displaced plantarly with obvious exposed bone and the wound with profuse bleeding.  Subsequent x-rays were obtained, confirming a subluxation of the interphalangeal joint with associated fracture in relation to the base of the proximal phalanx.  Mr. Hemp is brought to the operating at  this time for planned irrigation, debridement, and stabilization of his IP joint fracture dislocation.  Preoperatively, I had counseled Mr. Riesen on treatment options as well as risks versus benefits thereof.  Possible surgical complications were reviewed including the potential bleeding, infection, neurovascular injury, malunion, nonunion, loss of fixation, anesthetic complication, and possible need for additional surgery.  He understands, accepts, and agrees with our planned procedure.  PROCEDURE IN DETAIL:  After undergoing routine preop evaluation, the patient received prophylactic antibiotics and was brought to the operating room, placed supine on op table, underwent smooth induction of general endotracheal anesthesia.  The right lower extremity was then sterilely prepped and draped from the knee distally.  Time-out was called.  The traumatic wound was explored and a large amount of clot was removed, and further exploration confirmed that there had been a complete disruption of all of the dorsal soft tissues, allowing the distal phalanx to fall plantarward.  With this wound being opened, it was possible to visualize the head of the proximal phalanx and with the associated fracture involving the base of the distal phalanx.  This area was debrided mechanically and then we irrigated the wound copiously with 3 L of saline.  All nonviable tissues were removed.  Once this was completed to my satisfaction, a 45 K-wire was then  directed initially antegrade through the base of the proximal phalanx at tip of the toe and then was directed back retrograde into the proximal phalanx once the distal phalanx had been appropriately reduced and overall alignment was achieved, much to my satisfaction.  Fluoroscopic images were used to confirmed good alignment of the interphalangeal joint and good position of the hardware.  Tip of the pin was then bent 90 degrees, clipped, and protected with a  pin cap.  At this point, I then performed a repair of the soft tissue injury using a combination of simple and horizontal mattress and 3-0 nylon sutures allowing excellent reapposition of the soft tissue laceration which was approximately 4 cm in length in total. Once this was completed, Xeroform was placed over the lacerations and a very bulky forefoot dry dressing was applied, wrapped with Kerlix and Ace bandage.  The patient was then awakened, extubated, and taken to the recovery room in stable condition.     Vania Rea. Tahesha Skeet, M.D.     KMS/MEDQ  D:  05/19/2012  T:  05/19/2012  Job:  [id number]

## 2012-05-19 NOTE — ED Provider Notes (Signed)
History     CSN: [id number]  Arrival date & time 05/19/12  0133   First MD Initiated Contact with Patient 05/19/12 0144      No chief complaint on file.   (Consider location/radiation/quality/duration/timing/severity/associated sxs/prior treatment) HPI 51 year old male presents to emergency department from home with complaint of toe laceration. Patient reports he was walking in his garage with crocs when he hit his toe against a heavy metal object. Patient reports a laceration to the toe, reports seeing bone in the laceration. Patient has decreased sensation to the distal aspect of his toe, bleeding has been controlled with a dressing. He reports his tetanus is up-to-date. Patient has history of coronary disease, is on aspirin and Plavix after a stent in 2010. He has history of diabetes. Patient with poorly healing ulcer to the plantar surface of his left toe do to irritation/injury from the same shoes, about 2 weeks ago  Past Medical History  Diagnosis Date  . Hyperlipidemia   . Hypertension   . CAD (coronary artery disease)   . DJD (degenerative joint disease)   . Hypothyroid   . Gout   . Anxiety   . Osteoarthritis   . Diabetes mellitus   . Renal calculus     hx    History reviewed. No pertinent past surgical history.  Family History  Problem Relation Age of Onset  . Arthritis    . Coronary artery disease    . Diabetes    . Hypertension    . Prostate cancer    . Stroke      History  Substance Use Topics  . Smoking status: Never Smoker   . Smokeless tobacco: Current User    Types: Chew  . Alcohol Use: 1.0 oz/week    2 drink(s) per week      Review of Systems  All other systems reviewed and are negative.    Allergies  Review of patient's allergies indicates no known allergies.  Home Medications   Current Outpatient Rx  Name Route Sig Dispense Refill  . ALPRAZOLAM 1 MG PO TABS Oral Take 1 tablet (1 mg total) by mouth 3 (three) times daily as needed for  anxiety. 90 tablet 5  . AMLODIPINE BESYLATE 10 MG PO TABS Oral Take 1 tablet (10 mg total) by mouth daily. 30 tablet 11  . ASPIRIN 325 MG PO TABS Oral Take 325 mg by mouth daily.      . ATORVASTATIN CALCIUM 80 MG PO TABS Oral Take 1 tablet (80 mg total) by mouth daily. 30 tablet 11  . CLONIDINE HCL 0.3 MG PO TABS Oral Take 0.3 mg by mouth 2 (two) times daily.    [name] CLOPIDOGREL BISULFATE 75 MG PO TABS Oral Take 75 mg by mouth daily.    . FENOFIBRATE 160 MG PO TABS Oral Take 160 mg by mouth daily.    [name] GLIPIZIDE 10 MG PO TABS Oral Take 1 tablet (10 mg total) by mouth 2 (two) times daily. 60 tablet 11  . HYDROCODONE-ACETAMINOPHEN 5-500 MG PO TABS Oral Take 1 tablet by mouth every 6 (six) hours as needed. For pain    . LEVOTHYROXINE SODIUM 112 MCG PO TABS Oral Take 1 tablet (112 mcg total) by mouth daily. 30 tablet 11  . LISINOPRIL-HYDROCHLOROTHIAZIDE 20-12.5 MG PO TABS Oral Take 1 tablet by mouth 2 (two) times daily at 10 AM and 5 PM. 60 tablet 11  . METFORMIN HCL ER (MOD) 1000 MG PO TB24 Oral Take 1,000 mg by mouth  2 (two) times daily.    [name] METOPROLOL SUCCINATE ER 100 MG PO TB24 Oral Take 100 mg by mouth 2 (two) times daily. Take with or immediately following a meal.    . NITROGLYCERIN 0.4 MG SL SUBL Sublingual Place 0.4 mg under the tongue every 5 (five) minutes x 2 doses as needed. For chest pain    . ZOLPIDEM TARTRATE 10 MG PO TABS Oral Take 10 mg by mouth at bedtime as needed. For sleep    . FREESTYLE LANCETS MISC Other 1 each by Other route as needed. Use as instructed       BP 136/84  Pulse 75  Temp 97.2 F (36.2 C) (Oral)  Resp 16  SpO2 96%  Physical Exam  Constitutional: He is oriented to person, place, and time. He appears well-developed and well-nourished.  HENT:  Head: Normocephalic and atraumatic.  Mouth/Throat: Oropharynx is clear and moist.  Cardiovascular: Normal rate, regular rhythm and normal heart sounds.  Exam reveals no gallop and no friction rub.   No murmur  heard. Pulmonary/Chest: Effort normal and breath sounds normal. No respiratory distress. He has no wheezes. He has no rales. He exhibits no tenderness.  Musculoskeletal: He exhibits no tenderness.       Right great toe with laceration at the base of the nail bed that extends laterally about two thirds of the way around the toe. Patient with no sensation to the tip of the toe. Bleeding is controlled. No tenderness with movement or palpation of this segment. Patient with ulceration to left plantar surface of great toe. No drainage, no erythema  Neurological: He is alert and oriented to person, place, and time. He exhibits normal muscle tone. Coordination normal.  Skin: Skin is warm and dry. No rash noted. No erythema. No pallor.    ED Course  Procedures (including critical care time)  Labs Reviewed - No data to display No results found.   1. Open fracture of great toe of right foot       MDM  51 year old male with laceration of right great toe at level of the nail bed. Concern for possible open fracture or open dislocation in diabetic. We'll get x-rays, and may require orthopedic evaluation.  Patient noted to have open fracture or of his right great toe. Dr. supple to take to the operating room for washout.        Olivia Mackie, MD 05/19/12 [id number]

## 2012-05-19 NOTE — Anesthesia Procedure Notes (Signed)
Procedure Name: LMA Insertion Date/Time: 05/19/2012 4:43 AM Performed by: Wray Kearns A Pre-anesthesia Checklist: Patient identified, Timeout performed, Emergency Drugs available, Suction available and Patient being monitored Patient Re-evaluated:Patient Re-evaluated prior to inductionOxygen Delivery Method: Circle system utilized Preoxygenation: Pre-oxygenation with 100% oxygen Intubation Type: IV induction Ventilation: Mask ventilation without difficulty LMA: LMA inserted LMA Size: 5.0 Tube type: Oral Number of attempts: 1 Placement Confirmation: breath sounds checked- equal and bilateral and positive ETCO2 Tube secured with: Tape Dental Injury: Teeth and Oropharynx as per pre-operative assessment

## 2012-05-19 NOTE — Progress Notes (Signed)
Orthopedic Tech Progress Note Patient Details:  Robert Huang [facility] [birth year] [id number]  Ortho Devices Type of Ortho Device: Postop boot Ortho Device/Splint Location: right foot Ortho Device/Splint Interventions: Application   [name] 05/19/2012, 2:49 PM

## 2012-05-19 NOTE — H&P (Signed)
Robert Huang [phone]    Chief Complaint Open right great toe fracture HPI: The patient is a 51 y.o. male who sustained an open right great toe fracture when stubbed to on protruding piece of metal in his garage  Past Medical History  Diagnosis Date  . Hyperlipidemia   . Hypertension   . CAD (coronary artery disease)   . DJD (degenerative joint disease)   . Hypothyroid   . Gout   . Anxiety   . Osteoarthritis   . Diabetes mellitus   . Renal calculus     hx    History reviewed. No pertinent past surgical history.  Family History  Problem Relation Age of Onset  . Arthritis    . Coronary artery disease    . Diabetes    . Hypertension    . Prostate cancer    . Stroke      Social History:  reports that he has never smoked. His smokeless tobacco use includes Chew. He reports that he drinks about one ounce of alcohol per week. He reports that he does not use illicit drugs.  Allergies: No Known Allergies   (Not in a hospital admission)   Physical Exam: Right great toe with dorsal laceration at IP joint and distal phalanx displace platarward. xray with fx at IP joint  Vitals  Temp:  [97.2 F (36.2 C)] 97.2 F (36.2 C) (09/23 0141) Pulse Rate:  [75] 75  (09/23 0141) Resp:  [16] 16  (09/23 0141) BP: (136)/(84) 136/84 mmHg (09/23 0141) SpO2:  [96 %] 96 % (09/23 0141)  Assessment/Plan  Impression: Open fracture dislocation right great toe IP Joint  Plan of Action: to or for i and d. Risks, benefits and treatment options reviewed.  Hayley Horn M 05/19/2012, 3:41 AM

## 2012-05-19 NOTE — Progress Notes (Signed)
Robert Huang [id number]  MRN: [id number] DOB/Age: [birth year] 51 y.o. Physician: Lynnea Maizes, M.D. Day of Surgery Procedure(s) (LRB): IRRIGATION AND DEBRIDEMENT EXTREMITY (Right) OPEN REDUCTION INTERNAL FIXATION (ORIF) DISTAL PHALANX (Right)  Subjective: Denies any pain. Vital Signs Temp:  [97.2 F (36.2 C)-97.9 F (36.6 C)] 97.9 F (36.6 C) (09/23 0654) Pulse Rate:  [68-79] 79  (09/23 0654) Resp:  [10-16] 16  (09/23 0654) BP: (110-136)/(62-84) 120/66 mmHg (09/23 0654) SpO2:  [95 %-96 %] 96 % (09/23 0654) Weight:  [117.3 kg (258 lb 9.6 oz)] 117.3 kg (258 lb 9.6 oz) (09/23 0654)  Lab Results No results found for this basename: WBC:2,HGB:2,HCT:2,PLT:2 in the last 72 hours BMET No results found for this basename: NA:2,K:2,CL:2,CO2:2,GLUCOSE:2,BUN:2,CREATININE:2,CALCIUM:2 in the last 72 hours INR  Date Value Range Status  10/28/2008 1.0  0.00 - 1.49 Final     Exam  Dressings dry right foot, toes pink  Plan Complete IV ABX tomorrow then complete 5 days po amoxicillin. wooden shoe for ambulation, f/u Monday 9/30. D/c planning for Tuesday 9/24 [name] M 05/19/2012, 12:56 PM

## 2012-05-20 DIAGNOSIS — S92401B Displaced unspecified fracture of right great toe, initial encounter for open fracture: Secondary | ICD-10-CM

## 2012-05-20 LAB — BASIC METABOLIC PANEL
CO2: 30 mEq/L (ref 19–32)
Calcium: 8.9 mg/dL (ref 8.4–10.5)
Chloride: 102 mEq/L (ref 96–112)
Creatinine, Ser: 1.01 mg/dL (ref 0.50–1.35)
Glucose, Bld: 151 mg/dL — ABNORMAL HIGH (ref 70–99)

## 2012-05-20 MED ORDER — AMOXICILLIN-POT CLAVULANATE 875-125 MG PO TABS: 1.0000 | ORAL_TABLET | Freq: Two times a day (BID) | ORAL | Status: DC

## 2012-05-20 MED ORDER — HYDROMORPHONE HCL 2 MG PO TABS: 2.0000 mg | ORAL_TABLET | ORAL | Status: DC | PRN

## 2012-05-20 NOTE — Progress Notes (Signed)
Utilization review completed. Yuette Putnam, RN, BSN. 

## 2012-05-20 NOTE — Discharge Summary (Signed)
  PATIENT ID:      Robert Huang  MRN:     [id number] DOB/AGE:    June 27, 1961 / 51 y.o.     DISCHARGE SUMMARY  ADMISSION DATE:    05/19/2012 DISCHARGE DATE:   05/20/2012   ADMISSION DIAGNOSIS: Open fracture of great toe of right foot [826.1] Rt great toe fracture, open fracture Right great toe  (fracture Right great toe)  DISCHARGE DIAGNOSIS:  fracture Right great toe    ADDITIONAL DIAGNOSIS: Principal Problem:  *Open displaced fracture of right great toe  Past Medical History  Diagnosis Date  . Hyperlipidemia   . Hypertension   . CAD (coronary artery disease)   . DJD (degenerative joint disease)   . Hypothyroid   . Gout   . Anxiety   . Osteoarthritis   . Diabetes mellitus   . Renal calculus     hx    PROCEDURE: Procedure(s): IRRIGATION AND DEBRIDEMENT EXTREMITY OPEN REDUCTION INTERNAL FIXATION (ORIF) DISTAL PHALANX on 05/19/2012  CONSULTS:     HISTORY:  See H&P in chart  HOSPITAL COURSE:  Robert Huang is a 51 y.o. admitted on 05/19/2012 and found to have a diagnosis of fracture Right great toe.  After appropriate laboratory studies were obtained  they were taken to the operating room on 05/19/2012 and underwent Procedure(s): IRRIGATION AND DEBRIDEMENT EXTREMITY OPEN REDUCTION INTERNAL FIXATION (ORIF) DISTAL PHALANX.   They were given perioperative antibiotics:  Anti-infectives     Start     Dose/Rate Route Frequency Ordered Stop   05/19/12 1000   ceFAZolin (ANCEF) IVPB 2 g/50 mL premix        2 g 100 mL/hr over 30 Minutes Intravenous Every 6 hours 05/19/12 0640 05/19/12 2130        . Blood products given:none  Pt was kept on 24 hours of IV antibiotics. He remained stable with pain controlled. I changed his dressing the am of DC and all skin edges were viable. There was no active drainage. The pin site was in good condition and no signs of infectious process. He was instructed on wound care and discharged home on 7 day course of oral antibiotics.  The  patient was discharged on 1 Day Post-Op in  Good condition.

## 2012-05-21 ENCOUNTER — Encounter (HOSPITAL_COMMUNITY): Payer: Self-pay | Admitting: Orthopedic Surgery

## 2012-06-15 ENCOUNTER — Other Ambulatory Visit: Payer: Self-pay | Admitting: Family Medicine

## 2012-06-15 ENCOUNTER — Other Ambulatory Visit: Payer: Self-pay | Admitting: Cardiology

## 2012-06-16 ENCOUNTER — Other Ambulatory Visit: Payer: Self-pay | Admitting: Cardiology

## 2012-06-16 ENCOUNTER — Other Ambulatory Visit: Payer: Self-pay | Admitting: Family Medicine

## 2012-06-17 NOTE — Telephone Encounter (Signed)
Call in #90 with 5 rf 

## 2012-06-27 ENCOUNTER — Other Ambulatory Visit: Payer: Self-pay | Admitting: Family Medicine

## 2012-06-29 ENCOUNTER — Other Ambulatory Visit: Payer: Self-pay | Admitting: Family Medicine

## 2012-06-29 ENCOUNTER — Other Ambulatory Visit: Payer: Self-pay | Admitting: Internal Medicine

## 2012-07-01 NOTE — Telephone Encounter (Signed)
Call in #30 with 5 rf 

## 2012-07-09 ENCOUNTER — Other Ambulatory Visit: Payer: Self-pay | Admitting: Family Medicine

## 2012-07-15 ENCOUNTER — Encounter (HOSPITAL_COMMUNITY): Payer: Self-pay | Admitting: Pharmacy Technician

## 2012-07-16 ENCOUNTER — Encounter (HOSPITAL_COMMUNITY)
Admission: RE | Admit: 2012-07-16 | Discharge: 2012-07-16 | Disposition: A | Discharge status: Home / Self Care | Payer: Managed Care, Other (non HMO) | Source: Ambulatory Visit | Attending: Orthopedic Surgery | Admitting: Orthopedic Surgery

## 2012-07-16 ENCOUNTER — Encounter (HOSPITAL_COMMUNITY): Payer: Self-pay

## 2012-07-16 ENCOUNTER — Telehealth: Payer: Self-pay | Admitting: Cardiology

## 2012-07-16 ENCOUNTER — Ambulatory Visit (HOSPITAL_BASED_OUTPATIENT_CLINIC_OR_DEPARTMENT_OTHER): Payer: Managed Care, Other (non HMO) | Admitting: Radiology

## 2012-07-16 VITALS — BP 123/72 | HR 71 | Ht 73.0 in | Wt 234.0 lb

## 2012-07-16 DIAGNOSIS — I2510 Atherosclerotic heart disease of native coronary artery without angina pectoris: Secondary | ICD-10-CM

## 2012-07-16 DIAGNOSIS — R079 Chest pain, unspecified: Secondary | ICD-10-CM

## 2012-07-16 DIAGNOSIS — R0602 Shortness of breath: Secondary | ICD-10-CM

## 2012-07-16 DIAGNOSIS — R9431 Abnormal electrocardiogram [ECG] [EKG]: Secondary | ICD-10-CM

## 2012-07-16 HISTORY — DX: Umbilical hernia without obstruction or gangrene: K42.9

## 2012-07-16 LAB — CBC
HCT: 35.7 % — ABNORMAL LOW (ref 39.0–52.0)
Hemoglobin: 11.5 g/dL — ABNORMAL LOW (ref 13.0–17.0)
MCHC: 32.2 g/dL (ref 30.0–36.0)
MCV: 97.3 fL (ref 78.0–100.0)
RDW: 14.2 % (ref 11.5–15.5)

## 2012-07-16 LAB — BASIC METABOLIC PANEL
BUN: 12 mg/dL (ref 6–23)
CO2: 26 mEq/L (ref 19–32)
Chloride: 102 mEq/L (ref 96–112)
Creatinine, Ser: 0.88 mg/dL (ref 0.50–1.35)
Glucose, Bld: 117 mg/dL — ABNORMAL HIGH (ref 70–99)
Potassium: 4.3 mEq/L (ref 3.5–5.1)

## 2012-07-16 MED ORDER — REGADENOSON 0.4 MG/5ML IV SOLN
0.4000 mg | Freq: Once | INTRAVENOUS | Status: AC
  Administered 2012-07-16: 0.4 mg via INTRAVENOUS

## 2012-07-16 MED ORDER — TECHNETIUM TC 99M SESTAMIBI GENERIC - CARDIOLITE
10.0000 | Freq: Once | INTRAVENOUS | Status: AC | PRN
  Administered 2012-07-16: 10 via INTRAVENOUS

## 2012-07-16 MED ORDER — TECHNETIUM TC 99M SESTAMIBI GENERIC - CARDIOLITE
30.0000 | Freq: Once | INTRAVENOUS | Status: AC | PRN
  Administered 2012-07-16: 30 via INTRAVENOUS

## 2012-07-16 NOTE — Progress Notes (Signed)
Patient had stress test today and had a prior cardiac cath with 5 stents placed per patient. 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 PCP is Dr. Gershon Crane. Cardiologist is Dr. [name]. Encounters in [facility].

## 2012-07-16 NOTE — Telephone Encounter (Signed)
Spoke with sherri, pt to have hallux amputation 1st ray resection 07/17/12, they are asking for clearance. Per dr [name] the pt did not have the myoview in June when he was last seen and he will need to have this test completed before clearance can be given. Spoke with pt, he is aware and will come to the office today for a lexiscan myoview for clearance. Pre-cert made aware.

## 2012-07-16 NOTE — Progress Notes (Signed)
[facility] SITE 3 NUCLEAR MED [address] [id number] [place] [address] [phone]  Cardiology Nuclear Med Study  Robert Huang is a 51 y.o. male     MRN : [id number]     DOB: December 17, 1960  Procedure Date: 07/16/2012  Nuclear Med Background Indication for Stress Test:  Evaluation for Ischemia, Stent Patency, Abnormal EKG and Clearance for Pending Halux Amputation on 07/17/12 by Dr. [name] History:  3/10 Stent-RCA/CFX; 12/10 GMW:NUUVOZ, EF=57%  Cardiac Risk Factors: Family History - CAD, Hypertension, Lipids, NIDDM and Overweight  Symptoms:  Chest Pain/"Ache" (last episode of chest discomfort:none since June) and DOE    Nuclear Pre-Procedure Caffeine/Decaff Intake:  None > 12 hrs NPO After: 10:00pm   Lungs:  Clear. O2 Sat: 98% on room air. IV 0.9% NS with Angio Cath:  20g  IV Site: R Antecubital x 1, tolerated well IV Started by:  Irean Hong, RN  Chest Size (in):  48 Cup Size: n/a  Height: 6\' 1"  (1.854 m)  Weight:  234 lb (106.142 kg)  BMI:  Body mass index is 30.87 kg/(m^2). Tech Comments:  Took Toprol, Glipizide, and Metformin this am. FBS was 199 at 12:55 pm    Nuclear Med Study 1 or 2 day study: 1 day  Stress Test Type:  Lexiscan  Reading MD: [name], MD  Order Authorizing Provider:  [name], MD  Resting Radionuclide: Technetium 68m Sestamibi  Resting Radionuclide Dose: 11.0 mCi   Stress Radionuclide:  Technetium 40m Sestamibi  Stress Radionuclide Dose: 33.0 mCi           Stress Protocol Rest HR: 71 Stress HR: 92  Rest BP: 123/72 Stress BP: 112/71  Exercise Time (min): n/a METS: n/a   Predicted Max HR: 168 bpm % Max HR: 54.76 bpm Rate Pressure Product: 36644   Dose of Adenosine (mg):  n/a Dose of Lexiscan: 0.4 mg  Dose of Atropine (mg): n/a Dose of Dobutamine: n/a mcg/kg/min (at max HR)  Stress Test Technologist: Smiley Houseman, CMA-N  Nuclear Technologist:  Domenic Polite, CNMT     Rest Procedure:   Myocardial perfusion imaging was performed at rest 45 minutes following the intravenous administration of Technetium 41m Sestamibi.  Rest ECG: LVH with nonspecific T-wave changes.  Stress Procedure:  The patient received IV Lexiscan 0.4 mg over 15-seconds.  Technetium 62m Sestamibi was injected at 30-seconds.  There were more diffuse T-wave changes with Lexiscan.  Quantitative spect images were obtained after a 45 minute delay.  Stress ECG: No significant ST segment change suggestive of ischemia.  QPS Raw Data Images:  Acquisition technically good; normal left ventricular size. Stress Images:  Normal homogeneous uptake in all areas of the myocardium. Rest Images:  Normal homogeneous uptake in all areas of the myocardium. Subtraction (SDS):  No evidence of ischemia. Transient Ischemic Dilatation (Normal <1.22):  0.97 Lung/Heart Ratio (Normal <0.45):  0.32  Quantitative Gated Spect Images QGS EDV:  117 ml QGS ESV:  46 ml  Impression Exercise Capacity:  Lexiscan with no exercise. BP Response:  Normal blood pressure response. Clinical Symptoms:  There is dyspnea. ECG Impression:  No significant ST segment change suggestive of ischemia. Comparison with Prior Nuclear Study: No images to compare  Overall Impression:  Normal stress nuclear study.  LV Ejection Fraction: 61%.  LV Wall Motion:  NL LV Function; NL Wall Motion  [name]

## 2012-07-16 NOTE — Progress Notes (Signed)
Nurse called Dr. Laverta [place] office and left message informing staff that patient had a preadmit appointment but did not have any orders. Staff stated they would send him a message.

## 2012-07-16 NOTE — Telephone Encounter (Signed)
F/u   Pt returning nurse [facility] call, he can be reached at  [phone]

## 2012-07-16 NOTE — Telephone Encounter (Signed)
New problem:   Office returning your call back regarding clearance.

## 2012-07-17 ENCOUNTER — Inpatient Hospital Stay (HOSPITAL_COMMUNITY)
Admission: RE | Admit: 2012-07-17 | Discharge: 2012-07-21 | DRG: 617 | Disposition: A | Discharge status: Home / Self Care | Payer: Managed Care, Other (non HMO) | Source: Ambulatory Visit | Attending: Orthopedic Surgery | Admitting: Orthopedic Surgery

## 2012-07-17 ENCOUNTER — Telehealth: Payer: Self-pay | Admitting: Cardiology

## 2012-07-17 ENCOUNTER — Encounter (HOSPITAL_COMMUNITY): Payer: Self-pay | Admitting: Anesthesiology

## 2012-07-17 ENCOUNTER — Encounter (HOSPITAL_COMMUNITY)
Admission: RE | Disposition: A | Discharge status: Home / Self Care | Payer: Self-pay | Source: Ambulatory Visit | Attending: Orthopedic Surgery

## 2012-07-17 ENCOUNTER — Ambulatory Visit (HOSPITAL_COMMUNITY): Payer: Managed Care, Other (non HMO) | Admitting: Anesthesiology

## 2012-07-17 ENCOUNTER — Encounter (HOSPITAL_COMMUNITY): Payer: Self-pay | Admitting: *Deleted

## 2012-07-17 DIAGNOSIS — M9080 Osteopathy in diseases classified elsewhere, unspecified site: Secondary | ICD-10-CM | POA: Diagnosis present

## 2012-07-17 DIAGNOSIS — M109 Gout, unspecified: Secondary | ICD-10-CM | POA: Diagnosis present

## 2012-07-17 DIAGNOSIS — I2510 Atherosclerotic heart disease of native coronary artery without angina pectoris: Secondary | ICD-10-CM | POA: Diagnosis present

## 2012-07-17 DIAGNOSIS — E785 Hyperlipidemia, unspecified: Secondary | ICD-10-CM | POA: Diagnosis present

## 2012-07-17 DIAGNOSIS — G547 Phantom limb syndrome without pain: Secondary | ICD-10-CM | POA: Diagnosis not present

## 2012-07-17 DIAGNOSIS — F411 Generalized anxiety disorder: Secondary | ICD-10-CM | POA: Diagnosis present

## 2012-07-17 DIAGNOSIS — Z8249 Family history of ischemic heart disease and other diseases of the circulatory system: Secondary | ICD-10-CM

## 2012-07-17 DIAGNOSIS — K429 Umbilical hernia without obstruction or gangrene: Secondary | ICD-10-CM | POA: Diagnosis present

## 2012-07-17 DIAGNOSIS — A4901 Methicillin susceptible Staphylococcus aureus infection, unspecified site: Secondary | ICD-10-CM | POA: Diagnosis present

## 2012-07-17 DIAGNOSIS — E039 Hypothyroidism, unspecified: Secondary | ICD-10-CM | POA: Diagnosis present

## 2012-07-17 DIAGNOSIS — Z9089 Acquired absence of other organs: Secondary | ICD-10-CM

## 2012-07-17 DIAGNOSIS — Z7902 Long term (current) use of antithrombotics/antiplatelets: Secondary | ICD-10-CM

## 2012-07-17 DIAGNOSIS — I10 Essential (primary) hypertension: Secondary | ICD-10-CM | POA: Diagnosis present

## 2012-07-17 DIAGNOSIS — Z79899 Other long term (current) drug therapy: Secondary | ICD-10-CM

## 2012-07-17 DIAGNOSIS — Z9861 Coronary angioplasty status: Secondary | ICD-10-CM

## 2012-07-17 DIAGNOSIS — Z7982 Long term (current) use of aspirin: Secondary | ICD-10-CM

## 2012-07-17 DIAGNOSIS — Z23 Encounter for immunization: Secondary | ICD-10-CM

## 2012-07-17 DIAGNOSIS — M869 Osteomyelitis, unspecified: Secondary | ICD-10-CM

## 2012-07-17 DIAGNOSIS — E1169 Type 2 diabetes mellitus with other specified complication: Principal | ICD-10-CM | POA: Diagnosis present

## 2012-07-17 DIAGNOSIS — M1990 Unspecified osteoarthritis, unspecified site: Secondary | ICD-10-CM | POA: Diagnosis present

## 2012-07-17 HISTORY — PX: AMPUTATION: SHX166

## 2012-07-17 LAB — GLUCOSE, CAPILLARY: Glucose-Capillary: 117 mg/dL — ABNORMAL HIGH (ref 70–99)

## 2012-07-17 SURGERY — AMPUTATION, FOOT, RAY
Anesthesia: General | Site: Toe | Laterality: Right | Wound class: Dirty or Infected

## 2012-07-17 MED ORDER — BACITRACIN ZINC 500 UNIT/GM EX OINT
TOPICAL_OINTMENT | CUTANEOUS | Status: DC | PRN
  Administered 2012-07-17: 1 via TOPICAL

## 2012-07-17 MED ORDER — METOCLOPRAMIDE HCL 5 MG/ML IJ SOLN: 5.0000 mg | Freq: Three times a day (TID) | INTRAMUSCULAR | Status: DC | PRN

## 2012-07-17 MED ORDER — CLOPIDOGREL BISULFATE 75 MG PO TABS
75.0000 mg | ORAL_TABLET | Freq: Every day | ORAL | Status: DC
  Administered 2012-07-18 – 2012-07-21 (×4): 75 mg via ORAL
  Filled 2012-07-17 (×5): qty 1

## 2012-07-17 MED ORDER — LACTATED RINGERS IV SOLN
INTRAVENOUS | Status: DC | PRN
  Administered 2012-07-17: 15:00:00 via INTRAVENOUS

## 2012-07-17 MED ORDER — INFLUENZA VIRUS VACC SPLIT PF IM SUSP
0.5000 mL | INTRAMUSCULAR | Status: AC
  Filled 2012-07-17: qty 0.5

## 2012-07-17 MED ORDER — MORPHINE SULFATE 2 MG/ML IJ SOLN
1.0000 mg | INTRAMUSCULAR | Status: DC | PRN
  Filled 2012-07-17: qty 1

## 2012-07-17 MED ORDER — SODIUM CHLORIDE 0.9 % IV SOLN: INTRAVENOUS | Status: DC

## 2012-07-17 MED ORDER — METFORMIN HCL 500 MG PO TABS
1000.0000 mg | ORAL_TABLET | Freq: Two times a day (BID) | ORAL | Status: DC
  Administered 2012-07-18 – 2012-07-21 (×7): 1000 mg via ORAL
  Filled 2012-07-17 (×9): qty 2

## 2012-07-17 MED ORDER — HYDROCHLOROTHIAZIDE 12.5 MG PO CAPS
12.5000 mg | ORAL_CAPSULE | Freq: Every day | ORAL | Status: DC
  Administered 2012-07-18 – 2012-07-20 (×3): 12.5 mg via ORAL
  Filled 2012-07-17 (×4): qty 1

## 2012-07-17 MED ORDER — PROPOFOL 10 MG/ML IV BOLUS
INTRAVENOUS | Status: DC | PRN
  Administered 2012-07-17: 160 mg via INTRAVENOUS

## 2012-07-17 MED ORDER — FENTANYL CITRATE 0.05 MG/ML IJ SOLN
INTRAMUSCULAR | Status: DC | PRN
  Administered 2012-07-17: 100 ug via INTRAVENOUS

## 2012-07-17 MED ORDER — LISINOPRIL 20 MG PO TABS
20.0000 mg | ORAL_TABLET | Freq: Every day | ORAL | Status: DC
  Administered 2012-07-18 – 2012-07-20 (×3): 20 mg via ORAL
  Filled 2012-07-17 (×4): qty 1

## 2012-07-17 MED ORDER — CHLORHEXIDINE GLUCONATE 4 % EX LIQD: 60.0000 mL | Freq: Once | CUTANEOUS | Status: DC

## 2012-07-17 MED ORDER — HYDROMORPHONE HCL PF 1 MG/ML IJ SOLN
0.2500 mg | INTRAMUSCULAR | Status: DC | PRN
  Administered 2012-07-17 (×4): 0.5 mg via INTRAVENOUS

## 2012-07-17 MED ORDER — INDOMETHACIN ER 75 MG PO CPCR
75.0000 mg | ORAL_CAPSULE | Freq: Every day | ORAL | Status: DC | PRN
  Filled 2012-07-17: qty 1

## 2012-07-17 MED ORDER — BACITRACIN ZINC 500 UNIT/GM EX OINT
TOPICAL_OINTMENT | CUTANEOUS | Status: AC
  Filled 2012-07-17: qty 15

## 2012-07-17 MED ORDER — AMLODIPINE BESYLATE 10 MG PO TABS
10.0000 mg | ORAL_TABLET | Freq: Every day | ORAL | Status: DC
  Administered 2012-07-19 – 2012-07-20 (×2): 10 mg via ORAL
  Filled 2012-07-17 (×4): qty 1

## 2012-07-17 MED ORDER — HYDROCODONE-ACETAMINOPHEN 5-325 MG PO TABS
1.0000 | ORAL_TABLET | Freq: Four times a day (QID) | ORAL | Status: DC | PRN
  Administered 2012-07-18: 2 via ORAL
  Filled 2012-07-17: qty 2

## 2012-07-17 MED ORDER — INSULIN ASPART 100 UNIT/ML ~~LOC~~ SOLN
0.0000 [IU] | Freq: Three times a day (TID) | SUBCUTANEOUS | Status: DC
  Administered 2012-07-18: 5 [IU] via SUBCUTANEOUS
  Administered 2012-07-19 – 2012-07-20 (×4): 3 [IU] via SUBCUTANEOUS
  Administered 2012-07-21: 2 [IU] via SUBCUTANEOUS

## 2012-07-17 MED ORDER — SODIUM CHLORIDE 0.9 % IV SOLN
INTRAVENOUS | Status: DC
  Administered 2012-07-18: 125 mL/h via INTRAVENOUS

## 2012-07-17 MED ORDER — VANCOMYCIN HCL 1000 MG IV SOLR
1000.0000 mg | INTRAVENOUS | Status: DC | PRN
  Administered 2012-07-17: 1000 mg via INTRAVENOUS

## 2012-07-17 MED ORDER — ZOLPIDEM TARTRATE 5 MG PO TABS
10.0000 mg | ORAL_TABLET | Freq: Every evening | ORAL | Status: DC | PRN
  Administered 2012-07-18: 10 mg via ORAL
  Filled 2012-07-17: qty 2

## 2012-07-17 MED ORDER — METOPROLOL SUCCINATE ER 100 MG PO TB24
100.0000 mg | ORAL_TABLET | Freq: Two times a day (BID) | ORAL | Status: DC
  Administered 2012-07-18 – 2012-07-20 (×5): 100 mg via ORAL
  Filled 2012-07-17 (×9): qty 1

## 2012-07-17 MED ORDER — METOCLOPRAMIDE HCL 10 MG PO TABS: 5.0000 mg | ORAL_TABLET | Freq: Three times a day (TID) | ORAL | Status: DC | PRN

## 2012-07-17 MED ORDER — SENNA 8.6 MG PO TABS
1.0000 | ORAL_TABLET | Freq: Two times a day (BID) | ORAL | Status: DC
  Administered 2012-07-17: 8.6 mg via ORAL
  Filled 2012-07-17 (×3): qty 1

## 2012-07-17 MED ORDER — ONDANSETRON HCL 4 MG PO TABS: 4.0000 mg | ORAL_TABLET | Freq: Four times a day (QID) | ORAL | Status: DC | PRN

## 2012-07-17 MED ORDER — NITROGLYCERIN 0.4 MG SL SUBL: 0.4000 mg | SUBLINGUAL_TABLET | SUBLINGUAL | Status: DC | PRN

## 2012-07-17 MED ORDER — GLIPIZIDE 10 MG PO TABS
10.0000 mg | ORAL_TABLET | Freq: Two times a day (BID) | ORAL | Status: DC
  Administered 2012-07-18 – 2012-07-21 (×7): 10 mg via ORAL
  Filled 2012-07-17 (×9): qty 1

## 2012-07-17 MED ORDER — LISINOPRIL-HYDROCHLOROTHIAZIDE 20-12.5 MG PO TABS: 1.0000 | ORAL_TABLET | Freq: Two times a day (BID) | ORAL | Status: DC

## 2012-07-17 MED ORDER — VANCOMYCIN HCL IN DEXTROSE 1-5 GM/200ML-% IV SOLN
INTRAVENOUS | Status: AC
  Filled 2012-07-17: qty 200

## 2012-07-17 MED ORDER — ASPIRIN 325 MG PO TABS
325.0000 mg | ORAL_TABLET | Freq: Every day | ORAL | Status: DC
  Administered 2012-07-18 – 2012-07-20 (×3): 325 mg via ORAL
  Filled 2012-07-17 (×4): qty 1

## 2012-07-17 MED ORDER — LIDOCAINE HCL (CARDIAC) 20 MG/ML IV SOLN
INTRAVENOUS | Status: DC | PRN
  Administered 2012-07-17: 80 mg via INTRAVENOUS

## 2012-07-17 MED ORDER — ONDANSETRON HCL 4 MG/2ML IJ SOLN: 4.0000 mg | Freq: Four times a day (QID) | INTRAMUSCULAR | Status: DC | PRN

## 2012-07-17 MED ORDER — ATORVASTATIN CALCIUM 80 MG PO TABS
80.0000 mg | ORAL_TABLET | Freq: Every day | ORAL | Status: DC
  Administered 2012-07-18 – 2012-07-20 (×3): 80 mg via ORAL
  Filled 2012-07-17 (×5): qty 1

## 2012-07-17 MED ORDER — HYDROMORPHONE HCL PF 1 MG/ML IJ SOLN
INTRAMUSCULAR | Status: AC
  Filled 2012-07-17: qty 1

## 2012-07-17 MED ORDER — OXYCODONE HCL 5 MG PO TABS
5.0000 mg | ORAL_TABLET | ORAL | Status: DC | PRN
  Administered 2012-07-17 – 2012-07-20 (×14): 10 mg via ORAL
  Filled 2012-07-17 (×14): qty 2

## 2012-07-17 MED ORDER — MIDAZOLAM HCL 5 MG/5ML IJ SOLN
INTRAMUSCULAR | Status: DC | PRN
  Administered 2012-07-17: 2 mg via INTRAVENOUS

## 2012-07-17 MED ORDER — LEVOTHYROXINE SODIUM 112 MCG PO TABS
112.0000 ug | ORAL_TABLET | Freq: Every day | ORAL | Status: DC
  Administered 2012-07-19 – 2012-07-21 (×3): 112 ug via ORAL
  Filled 2012-07-17 (×5): qty 1

## 2012-07-17 MED ORDER — CLONIDINE HCL 0.3 MG PO TABS
0.3000 mg | ORAL_TABLET | Freq: Two times a day (BID) | ORAL | Status: DC
  Administered 2012-07-18 – 2012-07-20 (×5): 0.3 mg via ORAL
  Filled 2012-07-17 (×9): qty 1

## 2012-07-17 MED ORDER — DOCUSATE SODIUM 100 MG PO CAPS
100.0000 mg | ORAL_CAPSULE | Freq: Two times a day (BID) | ORAL | Status: DC
  Administered 2012-07-17 – 2012-07-20 (×7): 100 mg via ORAL
  Filled 2012-07-17 (×9): qty 1

## 2012-07-17 MED ORDER — VANCOMYCIN HCL 1000 MG IV SOLR
2000.0000 mg | Freq: Once | INTRAVENOUS | Status: AC
  Administered 2012-07-17: 2000 mg via INTRAVENOUS
  Filled 2012-07-17: qty 2000

## 2012-07-17 MED ORDER — FENOFIBRATE 160 MG PO TABS
160.0000 mg | ORAL_TABLET | Freq: Every day | ORAL | Status: DC
  Administered 2012-07-18 – 2012-07-20 (×3): 160 mg via ORAL
  Filled 2012-07-17 (×5): qty 1

## 2012-07-17 MED ORDER — VANCOMYCIN HCL IN DEXTROSE 1-5 GM/200ML-% IV SOLN
1000.0000 mg | Freq: Two times a day (BID) | INTRAVENOUS | Status: DC
  Administered 2012-07-18 – 2012-07-20 (×5): 1000 mg via INTRAVENOUS
  Filled 2012-07-17 (×7): qty 200

## 2012-07-17 MED ORDER — ALPRAZOLAM 0.5 MG PO TABS
1.0000 mg | ORAL_TABLET | Freq: Two times a day (BID) | ORAL | Status: DC | PRN
  Administered 2012-07-18 – 2012-07-20 (×6): 1 mg via ORAL
  Filled 2012-07-17 (×6): qty 2
  Filled 2012-07-17 (×2): qty 1

## 2012-07-17 MED ORDER — LACTATED RINGERS IV SOLN
INTRAVENOUS | Status: DC
  Administered 2012-07-17: 15:00:00 via INTRAVENOUS

## 2012-07-17 SURGICAL SUPPLY — 39 items
BLADE LONG MED 31X9 (MISCELLANEOUS) ×2 IMPLANT
BNDG CMPR 9X4 STRL LF SNTH (GAUZE/BANDAGES/DRESSINGS) ×1
BNDG COHESIVE 4X5 TAN STRL (GAUZE/BANDAGES/DRESSINGS) ×2 IMPLANT
BNDG COHESIVE 6X5 TAN STRL LF (GAUZE/BANDAGES/DRESSINGS) ×2 IMPLANT
BNDG ESMARK 4X9 LF (GAUZE/BANDAGES/DRESSINGS) ×2 IMPLANT
CHLORAPREP W/TINT 26ML (MISCELLANEOUS) ×2 IMPLANT
CLOTH BEACON ORANGE TIMEOUT ST (SAFETY) ×2 IMPLANT
CUFF TOURNIQUET SINGLE 34IN LL (TOURNIQUET CUFF) IMPLANT
CUFF TOURNIQUET SINGLE 44IN (TOURNIQUET CUFF) IMPLANT
DRAPE U-SHAPE 47X51 STRL (DRAPES) ×4 IMPLANT
DRSG ADAPTIC 3X8 NADH LF (GAUZE/BANDAGES/DRESSINGS) ×2 IMPLANT
DRSG EMULSION OIL 3X3 NADH (GAUZE/BANDAGES/DRESSINGS) ×2 IMPLANT
DRSG PAD ABDOMINAL 8X10 ST (GAUZE/BANDAGES/DRESSINGS) ×2 IMPLANT
ELECT REM PT RETURN 9FT ADLT (ELECTROSURGICAL) ×2
ELECTRODE REM PT RTRN 9FT ADLT (ELECTROSURGICAL) ×1 IMPLANT
GLOVE BIO SURGEON STRL SZ8 (GLOVE) ×4 IMPLANT
GLOVE BIOGEL PI IND STRL 8 (GLOVE) ×1 IMPLANT
GLOVE BIOGEL PI INDICATOR 8 (GLOVE) ×1
GOWN PREVENTION PLUS XLARGE (GOWN DISPOSABLE) ×2 IMPLANT
GOWN STRL NON-REIN LRG LVL3 (GOWN DISPOSABLE) ×2 IMPLANT
KIT BASIN OR (CUSTOM PROCEDURE TRAY) ×2 IMPLANT
KIT ROOM TURNOVER OR (KITS) ×2 IMPLANT
MANIFOLD NEPTUNE II (INSTRUMENTS) ×2 IMPLANT
NS IRRIG 1000ML POUR BTL (IV SOLUTION) ×2 IMPLANT
PACK ORTHO EXTREMITY (CUSTOM PROCEDURE TRAY) ×2 IMPLANT
PAD ARMBOARD 7.5X6 YLW CONV (MISCELLANEOUS) ×4 IMPLANT
PAD CAST 4YDX4 CTTN HI CHSV (CAST SUPPLIES) ×1 IMPLANT
PADDING CAST COTTON 4X4 STRL (CAST SUPPLIES) ×1
SPONGE GAUZE 4X4 12PLY (GAUZE/BANDAGES/DRESSINGS) ×2 IMPLANT
SPONGE LAP 18X18 X RAY DECT (DISPOSABLE) ×2 IMPLANT
STAPLER VISISTAT 35W (STAPLE) IMPLANT
STOCKINETTE IMPERVIOUS LG (DRAPES) IMPLANT
SUCTION FRAZIER TIP 10 FR DISP (SUCTIONS) ×2 IMPLANT
SUT ETHILON 2 0 PSLX (SUTURE) IMPLANT
TOWEL OR 17X24 6PK STRL BLUE (TOWEL DISPOSABLE) ×2 IMPLANT
TOWEL OR 17X26 10 PK STRL BLUE (TOWEL DISPOSABLE) ×2 IMPLANT
TUBE CONNECTING 12X1/4 (SUCTIONS) ×2 IMPLANT
UNDERPAD 30X30 INCONTINENT (UNDERPADS AND DIAPERS) ×2 IMPLANT
WATER STERILE IRR 1000ML POUR (IV SOLUTION) ×2 IMPLANT

## 2012-07-17 NOTE — Progress Notes (Signed)
Dr. Victorino Dike in room showed dressing, redressed with kling abd gauze and ace bandage elevated on 2 pillows

## 2012-07-17 NOTE — Preoperative (Signed)
Beta Blockers   Reason not to administer Beta Blockers:Not Applicable 

## 2012-07-17 NOTE — Telephone Encounter (Signed)
Sherry with dr hewitt's office, needs surg clearance faxed asap pt surgery tomorrow, [phone] if any problems call [phone]

## 2012-07-17 NOTE — Brief Op Note (Signed)
07/17/2012  4:34 PM  PATIENT:  Robert Huang  51 y.o. male  PRE-OPERATIVE DIAGNOSIS:  right hallux osteomylitis   POST-OPERATIVE DIAGNOSIS:  right hallux osteomylitis   Procedure(s): Right 1st ray resection  SURGEON:  [name], MD  ASSISTANT: n/a  ANESTHESIA:   General, regional  EBL:  minimal   TOURNIQUET:  Ankle esmarch 17 min  COMPLICATIONS:  None apparent  DISPOSITION:  Extubated, awake and stable to recovery.  DICTATION ID:  [id number]

## 2012-07-17 NOTE — Progress Notes (Signed)
ANTIBIOTIC CONSULT NOTE - INITIAL  Pharmacy Consult for Vancomycin  Indication: Osteomyelitis  No Known Allergies  Patient Measurements: Height: 6\' 1"  (185.4 cm) Weight: 235 lb (106.595 kg) IBW/kg (Calculated) : 79.9  Adjusted Body Weight:  Vital Signs: Temp: 98.2 F (36.8 C) (11/21 1832) Temp src: Oral (11/21 1832) BP: 130/67 mmHg (11/21 1832) Pulse Rate: 72  (11/21 1832) Intake/Output from previous day:   Intake/Output from this shift:    Labs:  Basename 07/16/12 1555  WBC 5.8  HGB 11.5*  PLT 301  LABCREA --  CREATININE 0.88   The CrCl is unknown because both a height and weight (above a minimum accepted value) are required for this calculation. No results found for this basename: VANCOTROUGH:2,VANCOPEAK:2,VANCORANDOM:2,GENTTROUGH:2,GENTPEAK:2,GENTRANDOM:2,TOBRATROUGH:2,TOBRAPEAK:2,TOBRARND:2,AMIKACINPEAK:2,AMIKACINTROU:2,AMIKACIN:2, in the last 72 hours   Microbiology: Recent Results (from the past 720 hour(s))  SURGICAL PCR SCREEN     Status: Normal   Collection Time   07/16/12  3:50 PM      Component Value Range Status Comment   MRSA, PCR NEGATIVE  NEGATIVE Final    Staphylococcus aureus NEGATIVE  NEGATIVE Final     Medical History: Past Medical History  Diagnosis Date  . Hyperlipidemia   . Hypertension   . CAD (coronary artery disease)   . DJD (degenerative joint disease)   . Hypothyroid   . Gout   . Anxiety   . Osteoarthritis   . Diabetes mellitus   . Renal calculus     hx  . Umbilical hernia    Medications:  Prescriptions prior to admission  Medication Sig Dispense Refill  . ALPRAZolam (XANAX) 1 MG tablet Take 1 mg by mouth 2 (two) times daily as needed. For anxiety.      [name] amLODipine (NORVASC) 10 MG tablet Take 10 mg by mouth daily.      [name] aspirin 325 MG tablet Take 325 mg by mouth daily.      [name] atorvastatin (LIPITOR) 80 MG tablet Take 80 mg by mouth at bedtime.      . cloNIDine (CATAPRES) 0.3 MG tablet Take 0.3 mg by mouth 2 (two) times  daily.      . clopidogrel (PLAVIX) 75 MG tablet Take 75 mg by mouth daily.      . fenofibrate 160 MG tablet Take 160 mg by mouth at bedtime.      [name] glipiZIDE (GLUCOTROL) 10 MG tablet Take 10 mg by mouth 2 (two) times daily before a meal.      . HYDROcodone-acetaminophen (VICODIN) 5-500 MG per tablet Take 1 tablet by mouth every 6 (six) hours as needed.      [name] levothyroxine (SYNTHROID, LEVOTHROID) 112 MCG tablet Take 112 mcg by mouth daily.      [name] lisinopril-hydrochlorothiazide (PRINZIDE,ZESTORETIC) 20-12.5 MG per tablet Take 1 tablet by mouth 2 (two) times daily.      . metFORMIN (GLUCOPHAGE) 1000 MG tablet Take 1,000 mg by mouth 2 (two) times daily with a meal.      . metoprolol succinate (TOPROL-XL) 100 MG 24 hr tablet Take 100 mg by mouth 2 (two) times daily. Take with or immediately following a meal.      . zolpidem (AMBIEN) 10 MG tablet Take 10 mg by mouth at bedtime as needed. For insomnia.      . indomethacin (INDOCIN SR) 75 MG CR capsule Take 75 mg by mouth daily as needed. For gout.      . nitroGLYCERIN (NITROSTAT) 0.4 MG SL tablet Place 0.4 mg under the tongue every 5 (five)  minutes as needed. For chest pain.       Assessment: 51 yo Robert Huang admitted 07/17/2012 after a 1st ray resection due to osteomylitis.  Pharmacy consulted to dose vancomycin  Goal of Therapy:  Vancomycin trough level 15-20 mcg/ml  Plan:  1. Vancomycin 2g IV x 1 then 1g IV q12h 2. Follow up SCr, UOP, cultures, clinical course and adjust as clinically indicated.    Thank you for allowing pharmacy to be a part of this patients care team.  Lovenia Kim Pharm.D., BCPS Clinical Pharmacist 07/17/2012 7:07 PM Pager: [phone] Phone: [phone]

## 2012-07-17 NOTE — Telephone Encounter (Signed)
Clearance faxed to the number provided.

## 2012-07-17 NOTE — H&P (Signed)
Robert Huang is an 51 y.o. male.   Chief Complaint: right hallux osteomyelitis HPI: 51 y/o male with PMH of diabetes and CAD sustained an open fracture of his right hallux several months ago.  He has had difficulty with the wound healing, and xrays now reveal osteomyelitis of the head of the proximal phalanx with nearly complete destruction.  Past Medical History  Diagnosis Date  . Hyperlipidemia   . Hypertension   . CAD (coronary artery disease)   . DJD (degenerative joint disease)   . Hypothyroid   . Gout   . Anxiety   . Osteoarthritis   . Diabetes mellitus   . Renal calculus     hx  . Umbilical hernia     Past Surgical History  Procedure Date  . I & d toe 05/19/2012    rt great toe  . Coronary angioplasty with stent placement   . I&d extremity 05/19/2012    Procedure: IRRIGATION AND DEBRIDEMENT EXTREMITY;  Surgeon: Robert Lange, MD;  Location: [facility] OR;  Service: Orthopedics;  Laterality: Right;  Right Great toe  . Tonsillectomy   . Finger surgery     left index finger    Family History  Problem Relation Age of Onset  . Arthritis    . Coronary artery disease    . Diabetes    . Hypertension    . Prostate cancer    . Stroke     Social History:  reports that he has never smoked. His smokeless tobacco use includes Chew. He reports that he drinks about one ounce of alcohol per week. He reports that he does not use illicit drugs.  Allergies: No Known Allergies  Medications Prior to Admission  Medication Sig Dispense Refill  . ALPRAZolam (XANAX) 1 MG tablet Take 1 mg by mouth 2 (two) times daily as needed. For anxiety.      [name] amLODipine (NORVASC) 10 MG tablet Take 10 mg by mouth daily.      [name] aspirin 325 MG tablet Take 325 mg by mouth daily.      [name] atorvastatin (LIPITOR) 80 MG tablet Take 80 mg by mouth at bedtime.      . cloNIDine (CATAPRES) 0.3 MG tablet Take 0.3 mg by mouth 2 (two) times daily.      . clopidogrel (PLAVIX) 75 MG tablet Take 75 mg by mouth daily.       . fenofibrate 160 MG tablet Take 160 mg by mouth at bedtime.      [name] glipiZIDE (GLUCOTROL) 10 MG tablet Take 10 mg by mouth 2 (two) times daily before a meal.      . HYDROcodone-acetaminophen (VICODIN) 5-500 MG per tablet Take 1 tablet by mouth every 6 (six) hours as needed.      [name] levothyroxine (SYNTHROID, LEVOTHROID) 112 MCG tablet Take 112 mcg by mouth daily.      [name] lisinopril-hydrochlorothiazide (PRINZIDE,ZESTORETIC) 20-12.5 MG per tablet Take 1 tablet by mouth 2 (two) times daily.      . metFORMIN (GLUCOPHAGE) 1000 MG tablet Take 1,000 mg by mouth 2 (two) times daily with a meal.      . metoprolol succinate (TOPROL-XL) 100 MG 24 hr tablet Take 100 mg by mouth 2 (two) times daily. Take with or immediately following a meal.      . zolpidem (AMBIEN) 10 MG tablet Take 10 mg by mouth at bedtime as needed. For insomnia.      . indomethacin (INDOCIN SR) 75 MG CR capsule Take  75 mg by mouth daily as needed. For gout.      . nitroGLYCERIN (NITROSTAT) 0.4 MG SL tablet Place 0.4 mg under the tongue every 5 (five) minutes as needed. For chest pain.        Results for orders placed during the hospital encounter of 2012/07/24 (from the past 48 hour(s))  GLUCOSE, CAPILLARY     Status: Abnormal   Collection Time   07/24/2012  1:18 PM      Component Value Range Comment   Glucose-Capillary 142 (*) 70 - 99 mg/dL    Dg Chest 2 View  96/11/5407  *RADIOLOGY REPORT*  Clinical Data: Preop right toe amputation  CHEST - 2 VIEW  Comparison: Chest radiograph 10/27/2008  Findings: Normal mediastinum and heart silhouette.  Costophrenic angles are clear.  No effusion, infiltrate, or pneumothorax. No acute osseous abnormality.  IMPRESSION: No acute cardiopulmonary process.   Original Report Authenticated By: Robert Huang, M.D.     ROS  No recent f/n/v/wt loss.  + chills  Blood pressure 131/80, pulse 69, temperature 98.2 F (36.8 C), temperature source Oral, resp. rate 20, SpO2 97.00%. Physical Exam wn wd male in  nad.  A and O x 4.  Mood and affect normal.  EOMI.  Respirations unlabored.  R hallux with tremendous swelling, erythema and warmth.  Purulent drainage from two lesions.  No lymphadenopathy.  2+ dp and pt pulses.  Diminshed sens to LT distally.  Assessment/Plan Right hallux osteomyelitis - to OR for 1st ray resection.  The risks and benefits of the alternative treatment options have been discussed in detail.  The patient wishes to proceed with surgery and specifically understands risks of bleeding, infection, nerve damage, blood clots, need for additional surgery, amputation and death.   Robert Huang 07/24/12, 3:35 PM

## 2012-07-17 NOTE — Anesthesia Preprocedure Evaluation (Addendum)
Anesthesia Evaluation  Patient identified by MRN, date of birth, ID band Patient awake    Reviewed: Allergy & Precautions, H&P , NPO status , Patient's Chart, lab work & pertinent test results  Airway Mallampati: II TM Distance: >3 FB Neck ROM: Full    Dental  (+) Teeth Intact and Dental Advisory Given   Pulmonary  Smokeless tobacco user breath sounds clear to auscultation        Cardiovascular hypertension, Pt. on medications and Pt. on home beta blockers + CAD Rhythm:Regular Rate:Normal     Neuro/Psych Anxiety negative neurological ROS     GI/Hepatic negative GI ROS, Neg liver ROS,   Endo/Other  diabetes, Well Controlled, Type 2, Oral Hypoglycemic AgentsHypothyroidism   Renal/GU stones     Musculoskeletal negative musculoskeletal ROS (+)   Abdominal   Peds  Hematology negative hematology ROS (+)   Anesthesia Other Findings   Reproductive/Obstetrics                         Anesthesia Physical Anesthesia Plan  ASA: III  Anesthesia Plan: General   Post-op Pain Management:    Induction: Intravenous  Airway Management Planned: LMA  Additional Equipment:   Intra-op Plan:   Post-operative Plan: Extubation in OR  Informed Consent: I have reviewed the patients History and Physical, chart, labs and discussed the procedure including the risks, benefits and alternatives for the proposed anesthesia with the patient or authorized representative who has indicated his/her understanding and acceptance.   Dental advisory given  Plan Discussed with: Anesthesiologist, Surgeon and CRNA  Anesthesia Plan Comments:        Anesthesia Quick Evaluation

## 2012-07-17 NOTE — Transfer of Care (Signed)
Immediate Anesthesia Transfer of Care Note  Patient: Robert Huang [id number]  Procedure(s) Performed: Procedure(s) (LRB) with comments: AMPUTATION RAY (Right) - right hallux amputation (1st ray resection )  Patient Location: PACU  Anesthesia Type:General  Level of Consciousness: awake, alert , oriented and patient cooperative  Airway & Oxygen Therapy: Patient Spontanous Breathing and Patient connected to nasal cannula oxygen  Post-op Assessment: Report given to PACU RN, Post -op Vital signs reviewed and stable and Patient moving all extremities X 4  Post vital signs: Reviewed and stable  Complications: No apparent anesthesia complications

## 2012-07-18 ENCOUNTER — Encounter (HOSPITAL_COMMUNITY): Payer: Self-pay | Admitting: Orthopedic Surgery

## 2012-07-18 DIAGNOSIS — M869 Osteomyelitis, unspecified: Secondary | ICD-10-CM

## 2012-07-18 DIAGNOSIS — Z792 Long term (current) use of antibiotics: Secondary | ICD-10-CM

## 2012-07-18 LAB — GLUCOSE, CAPILLARY
Glucose-Capillary: 69 mg/dL — ABNORMAL LOW (ref 70–99)
Glucose-Capillary: 117 mg/dL — ABNORMAL HIGH (ref 70–99)

## 2012-07-18 MED ORDER — SENNA 8.6 MG PO TABS
2.0000 | ORAL_TABLET | Freq: Two times a day (BID) | ORAL | Status: DC
  Administered 2012-07-18 – 2012-07-20 (×5): 17.2 mg via ORAL
  Filled 2012-07-18 (×8): qty 2

## 2012-07-18 NOTE — Progress Notes (Signed)
Subjective: 1 Day Post-Op Procedure(s) (LRB): AMPUTATION RAY (Right) Patient reports pain as mild.   No c/o.  No BM yet.  Awaiting ID input.  Objective: Vital signs in last 24 hours: Temp:  [98.1 F (36.7 C)-98.7 F (37.1 C)] 98.7 F (37.1 C) (11/22 0651) Pulse Rate:  [62-75] 65  (11/22 0651) Resp:  [14-21] 18  (11/22 0651) BP: (117-138)/(62-82) 135/70 mmHg (11/22 0651) SpO2:  [95 %-100 %] 99 % (11/22 0651) Weight:  [106.595 kg (235 lb)] 106.595 kg (235 lb) (11/21 1900)  Intake/Output from previous day: 11/21 0701 - 11/22 0700 In: 925 [I.V.:925] Out: 700 [Urine:700] Intake/Output this shift:     Basename 07/16/12 1555  HGB 11.5*    Basename 07/16/12 1555  WBC 5.8  RBC 3.67*  HCT 35.7*  PLT 301    Basename 07/16/12 1555  NA 140  K 4.3  CL 102  CO2 26  BUN 12  CREATININE 0.88  GLUCOSE 117*  CALCIUM 9.1   Cultures pending.  No organisms on GS.  Right foot wound dressed and dry.  Assessment/Plan: 1 Day Post-Op Procedure(s) (LRB): AMPUTATION RAY (Right) Continue IV vancomycin empiric therapy.  Await culture results.  PICC line per Dr. Luciana Axe. WBAT in hard sole shoe.  [name] 07/18/2012, 8:28 AM

## 2012-07-18 NOTE — Progress Notes (Signed)
CARE MANAGEMENT NOTE 07/18/2012  Patient:  Robert Huang, Robert Huang   Account Number:  [id number]  Date Initiated:  07/18/2012  Documentation initiated by:  Vance Peper  Subjective/Objective Assessment:   51 yr old male s/p 1st ray amputation.     Action/Plan:   patient being discharged home with a postop shoe. No home Health needs identified.   Anticipated DC Date:  07/18/2012   Anticipated DC Plan:  HOME/SELF CARE      DC Planning Services  CM consult      Choice offered to / List presented to:             Status of service:  Completed, signed off Medicare Important Message given?   (If response is "NO", the following Medicare IM given date fields will be blank) Date Medicare IM given:   Date Additional Medicare IM given:    Discharge Disposition:  HOME/SELF CARE  Per UR Regulation:  Reviewed for med. necessity/level of care/duration of stay  If discussed at Long Length of Stay Meetings, dates discussed:    Comments:

## 2012-07-18 NOTE — Consult Note (Signed)
[facility]     Reason for Consult:antibiotic management    Referring Physician: Dr. Victorino Dike  Active Problems:  * No active hospital problems. *       . amLODipine  10 mg Oral Daily  . aspirin  325 mg Oral Daily  . atorvastatin  80 mg Oral QHS  . cloNIDine  0.3 mg Oral BID  . clopidogrel  75 mg Oral Q breakfast  . docusate sodium  100 mg Oral BID  . fenofibrate  160 mg Oral QHS  . glipiZIDE  10 mg Oral BID AC  . lisinopril  20 mg Oral Daily   And  . hydrochlorothiazide  12.5 mg Oral Daily  . [EXPIRED] HYDROmorphone      . [EXPIRED] HYDROmorphone      . influenza  inactive virus vaccine  0.5 mL Intramuscular Tomorrow-1000  . insulin aspart  0-15 Units Subcutaneous TID WC  . levothyroxine  112 mcg Oral Q breakfast  . metFORMIN  1,000 mg Oral BID WC  . metoprolol succinate  100 mg Oral BID  . senna  2 tablet Oral BID  . [COMPLETED] vancomycin  2,000 mg Intravenous Once  . vancomycin  1,000 mg Intravenous Q12H  . [DISCONTINUED] chlorhexidine  60 mL Topical Once  . [DISCONTINUED] lisinopril-hydrochlorothiazide  1 tablet Oral BID  . [DISCONTINUED] senna  1 tablet Oral BID    Recommendations: Continue vancomycin for now, await cultures.  Oral therapy certainly may be adequate even if culture remains negative.  If he is anxious to go home, he could go on Augmentin 750 twice a day and could follow up with me next week for the cultures.  He should get a two week course.    Dr. Orvan Falconer to follow up tomorrow.    Assessment: He developed osteomyeltitis after a compound fracture, now s/p complete amputation.  Some residual erythema of skin.   I do worry about Gram negative organisms with the open wound.  Gram stain did not give any indication or gram positive or negative.    Antibiotics: Vancomycin 10/21 -   HPI: Robert Huang is a 51 y.o. male with CAD, gout, diabetes, who initially developed an open fracture of the right hallux several months  ago.  The wound never healed completely and despite several rounds of appropriate antibiotics his wound continued to have drainage and surrounding skin with erythema.  No fever or chills during this time.  He took Azithromcyin twice and an unknown antibiotic.  Seen by Dr. Victorino Dike and xray (report not available) c/w osteomyelitis. OR note noted and case discussed with Dr. Victorino Dike.  Good gross margins of bone and no residual osteomyelitis.  CRP 1.3, ESR 63.     Review of Systems: Pertinent items are noted in HPI.  Past Medical History  Diagnosis Date  . Hyperlipidemia   . Hypertension   . CAD (coronary artery disease)   . DJD (degenerative joint disease)   . Hypothyroid   . Gout   . Anxiety   . Osteoarthritis   . Diabetes mellitus   . Renal calculus     hx  . Umbilical hernia     History  Substance Use Topics  . Smoking status: Never Smoker   . Smokeless tobacco: Current User    Types: Chew  . Alcohol Use: 1.0 oz/week    2 drink(s) per week     Comment: social    Family History  Problem Relation Age of Onset  .  Arthritis    . Coronary artery disease    . Diabetes    . Hypertension    . Prostate cancer    . Stroke     No Known Allergies  OBJECTIVE: Blood pressure 134/70, pulse 65, temperature 98.7 F (37.1 C), temperature source Oral, resp. rate 18, height 6\' 1"  (1.854 m), weight 235 lb (106.595 kg), SpO2 99.00%. General: Awake, alert, nad Skin: no rash  Foot wrapped  Microbiology: Recent Results (from the past 240 hour(s))  SURGICAL PCR SCREEN     Status: Normal   Collection Time   07/16/12  3:50 PM      Component Value Range Status Comment   MRSA, PCR NEGATIVE  NEGATIVE Final    Staphylococcus aureus NEGATIVE  NEGATIVE Final   TISSUE CULTURE     Status: Normal (Preliminary result)   Collection Time   07/17/12  4:06 PM      Component Value Range Status Comment   Specimen Description TISSUE   Final    Special Requests RIGHT HALLUX PROXIMAL PHALANX   Final      Gram Stain     Final    Value: ABUNDANT WBC PRESENT, PREDOMINANTLY PMN     NO ORGANISMS SEEN   Culture NO GROWTH 1 DAY   Final    Report Status PENDING   Incomplete   ANAEROBIC CULTURE     Status: Normal (Preliminary result)   Collection Time   07/17/12  4:06 PM      Component Value Range Status Comment   Specimen Description TISSUE   Final    Special Requests RIGHT HALLUX PROXIMAL PHALANX   Final    Gram Stain     Final    Value: ABUNDANT WBC PRESENT, PREDOMINANTLY PMN     NO ORGANISMS SEEN   Culture PENDING   Incomplete    Report Status PENDING   Incomplete     [name], MD [facility] [phone] pager  [phone] cell 07/18/2012, 10:32 AM

## 2012-07-18 NOTE — Progress Notes (Signed)
Orthopedic Tech Progress Note Patient Details:  Robert Huang [id number] 09-Nov-1960 [id number]  Ortho Devices Type of Ortho Device: Postop boot Ortho Device/Splint Location: RIGHT POST OP SHOE Ortho Device/Splint Interventions: Application   [name], [name] 07/18/2012, 10:45 AM

## 2012-07-18 NOTE — Progress Notes (Signed)
Utilization review completed. Reann Dobias, RN, BSN. 

## 2012-07-18 NOTE — Op Note (Signed)
NAME:  MERIK, MIGNANO NO.:  [id number]  MEDICAL RECORD NO.:  [id number]  LOCATION:  5N01C                        FACILITY:  [facility]  PHYSICIAN:  [name], MD        DATE OF BIRTH:  1961/04/08  DATE OF PROCEDURE:  07/17/2012 DATE OF DISCHARGE:                              OPERATIVE REPORT   PREOPERATIVE DIAGNOSIS:  Right hallux osteomyelitis.  POSTOPERATIVE DIAGNOSIS:  Right hallux osteomyelitis.  PROCEDURE:  Right first ray resection.  SURGEON:  [name], MD.  ANESTHESIA:  General, regional.  ESTIMATED BLOOD LOSS:  Minimal.  TOURNIQUET TIME:  17 minutes with an ankle Esmarch.  COMPLICATIONS:  None apparent.  SPECIMEN:  Deep tissue from the right hallux to Microbiology for aerobic and anaerobic culture, right hallux to Pathology.  INDICATIONS FOR PROCEDURE:  The patient is a 51 year old male with a past medical history significant for diabetes and coronary artery disease.  He has a history of an open right hallux fracture back several months ago.  He has had difficulty healing the wound, this had significant swelling.  X-rays reveal osteomyelitis of the proximal phalanx with essentially complete obliteration of the head of the proximal phalanx.  He presents now for amputation of the right hallux. He understands the risks and benefits, the alternative treatment options and elects surgical treatment.  He specifically understands risks of bleeding, infection, nerve damage, blood clots, need for additional surgery, amputation, and death.  PROCEDURE IN DETAIL:  After preoperative consent was obtained and the correct operative site was identified.  The patient was brought to the operating room and placed supine on the operating table.  Preoperative antibiotics were held.  A surgical time-out was taken.  The right lower extremity was prepped and draped in standard sterile fashion.  The foot was exsanguinated and a 4-inch Esmarch bandage was wrapped  around the ankle as a tourniquet.  A racket-type incision was marked on the skin at the base of the hallux.  The incision was made and sharp dissection was carried down through the skin and subcutaneous tissue.  The hallux was disarticulated through the MP joint.  The sesamoids were resected.  The head of the metatarsal was resected with an oblique cut through the metatarsal shaft.  Plantar surface of the shaft was beveled with the saw.  The wound was then irrigated copiously.  Neurovascular bundles were cauterized.  The incision was closed with horizontal mattress sutures of 3-0 nylon.  Sterile dressings were applied followed by compression wrap.  The patient was awakened from anesthesia and transported to recovery room in stable condition.  On the back table, the hallux was incised and a specimen of the deep tissue from the infected area was obtained with a rongeur.  This was sent as a specimen to Microbiology for aerobic and anaerobic culture. The toe itself was sent to pathology as a specimen.  FOLLOWUP PLAN:  The patient will be admitted for IV antibiotics and Infectious Disease consultation.     [name], MD     JH/MEDQ  D:  07/17/2012  T:  07/18/2012  Job:  [id number]

## 2012-07-19 LAB — GLUCOSE, CAPILLARY
Glucose-Capillary: 188 mg/dL — ABNORMAL HIGH (ref 70–99)
Glucose-Capillary: 183 mg/dL — ABNORMAL HIGH (ref 70–99)
Glucose-Capillary: 80 mg/dL (ref 70–99)

## 2012-07-19 NOTE — Progress Notes (Signed)
Patient c/o of what he calls phantom pain.  Objective: Vital signs in last 24 hours: Temp:  [98.1 F (36.7 C)-98.9 F (37.2 C)] 98.9 F (37.2 C) (11/23 8657) Pulse Rate:  [62-79] 79  (11/23 0642) Resp:  [18-20] 20  (11/23 0642) BP: (119-151)/(52-66) 151/52 mmHg (11/23 0642) SpO2:  [97 %-100 %] 97 % (11/23 0642)  Intake/Output from previous day: 11/22 0701 - 11/23 0700 In: 200 [P.O.:200] Out: 500 [Urine:500] Intake/Output this shift: Total I/O In: -  Out: 600 [Urine:600]   Basename 07/16/12 1555  HGB 11.5*    Basename 07/16/12 1555  WBC 5.8  RBC 3.67*  HCT 35.7*  PLT 301    Basename 07/16/12 1555  NA 140  K 4.3  CL 102  CO2 26  BUN 12  CREATININE 0.88  GLUCOSE 117*  CALCIUM 9.1   No results found for this basename: LABPT:2,INR:2 in the last 72 hours  Incision: scant drainage little blood no pus  Acceptable and expected erythema  Assessment/Plan: Ortho stable   Continue current care and follow guidelines of ID based on culyures.   Alysson Geist ANDREW 07/19/2012, 10:20 AM

## 2012-07-19 NOTE — Progress Notes (Signed)
Patient ID: Robert Huang, male   DOB: 1960/11/20, 51 y.o.   MRN: [id number]    [facility]    Date of Admission:  07/17/2012           Day 3 vancomycin     . amLODipine  10 mg Oral Daily  . aspirin  325 mg Oral Daily  . atorvastatin  80 mg Oral QHS  . cloNIDine  0.3 mg Oral BID  . clopidogrel  75 mg Oral Q breakfast  . docusate sodium  100 mg Oral BID  . fenofibrate  160 mg Oral QHS  . glipiZIDE  10 mg Oral BID AC  . lisinopril  20 mg Oral Daily   And  . hydrochlorothiazide  12.5 mg Oral Daily  . [EXPIRED] influenza  inactive virus vaccine  0.5 mL Intramuscular Tomorrow-1000  . insulin aspart  0-15 Units Subcutaneous TID WC  . levothyroxine  112 mcg Oral Q breakfast  . metFORMIN  1,000 mg Oral BID WC  . metoprolol succinate  100 mg Oral BID  . senna  2 tablet Oral BID  . vancomycin  1,000 mg Intravenous Q12H    Subjective: He denies any current complaints  Objective: Temp:  [98.1 F (36.7 C)-98.9 F (37.2 C)] 98.9 F (37.2 C) (11/23 0642) Pulse Rate:  [62-79] 79  (11/23 0642) Resp:  [18-20] 20  (11/23 0642) BP: (119-151)/(52-66) 151/52 mmHg (11/23 0642) SpO2:  [97 %-100 %] 97 % (11/23 0642)  General: He is in no distress talking to his brother on the telephone The Ace wrap bandage over his right foot is intact, clean and dry  Lab Results Lab Results  Component Value Date   WBC 5.8 07/16/2012   HGB 11.5* 07/16/2012   HCT 35.7* 07/16/2012   MCV 97.3 07/16/2012   PLT 301 07/16/2012    Lab Results  Component Value Date   CREATININE 0.88 07/16/2012   BUN 12 07/16/2012   NA 140 07/16/2012   K 4.3 07/16/2012   CL 102 07/16/2012   CO2 26 07/16/2012    Lab Results  Component Value Date   ALT 60* 06/12/2011   AST 60* 06/12/2011   ALKPHOS 57 06/12/2011   BILITOT 0.5 06/12/2011      Microbiology: Recent Results (from the past 240 hour(s))  SURGICAL PCR SCREEN     Status: Normal   Collection Time   07/16/12  3:50 PM   Component Value Range Status Comment   MRSA, PCR NEGATIVE  NEGATIVE Final    Staphylococcus aureus NEGATIVE  NEGATIVE Final   TISSUE CULTURE     Status: Normal (Preliminary result)   Collection Time   07/17/12  4:06 PM      Component Value Range Status Comment   Specimen Description TISSUE   Final    Special Requests RIGHT HALLUX PROXIMAL PHALANX   Final    Gram Stain     Final    Value: ABUNDANT WBC PRESENT, PREDOMINANTLY PMN     NO ORGANISMS SEEN   Culture     Final    Value: FEW STAPHYLOCOCCUS AUREUS     Note: RIFAMPIN AND GENTAMICIN SHOULD NOT BE USED AS SINGLE DRUGS FOR TREATMENT OF STAPH INFECTIONS.   Report Status PENDING   Incomplete   ANAEROBIC CULTURE     Status: Normal (Preliminary result)   Collection Time   07/17/12  4:06 PM      Component Value Range Status Comment   Specimen Description TISSUE  Final    Special Requests RIGHT HALLUX PROXIMAL PHALANX   Final    Gram Stain     Final    Value: ABUNDANT WBC PRESENT, PREDOMINANTLY PMN     NO ORGANISMS SEEN   Culture     Final    Value: NO ANAEROBES ISOLATED; CULTURE IN PROGRESS FOR 5 DAYS   Report Status PENDING   Incomplete     Assessment: His operative cultures are growing staph aureus. I will continue vancomycin for now pending the final susceptibilities. If it is MSSA we'll convert him over to oral cephalexin. If it is MRSA we will need to consider PICC placement and a brief course of vancomycin.  Plan: 1. Continue vancomycin pending final culture results  Cliffton Asters, MD [facility] for Infectious Disease [facility] [phone] pager   [phone] cell 07/19/2012, 12:59 PM

## 2012-07-20 ENCOUNTER — Other Ambulatory Visit: Payer: Self-pay | Admitting: Family Medicine

## 2012-07-20 LAB — TISSUE CULTURE

## 2012-07-20 LAB — VANCOMYCIN, TROUGH: Vancomycin Tr: 10.2 ug/mL (ref 10.0–20.0)

## 2012-07-20 MED ORDER — CEPHALEXIN 500 MG PO CAPS
500.0000 mg | ORAL_CAPSULE | Freq: Four times a day (QID) | ORAL | Status: DC
  Administered 2012-07-20 – 2012-07-21 (×4): 500 mg via ORAL
  Filled 2012-07-20 (×8): qty 1

## 2012-07-20 NOTE — Progress Notes (Signed)
Patient ID: Robert Huang, male   DOB: [birth year], 51 y.o.   MRN: [id number] Subjective: 3 Days Post-Op Procedure(s) (LRB): AMPUTATION RAY (Right)    Patient reports pain as mild.  No problems, bored, ready to go home  Objective:   VITALS:   Filed Vitals:   07/20/12 0700  BP: 128/75  Pulse: 65  Temp: 98.3 F (36.8 C)  Resp: 18    Incision: scant drainage, dressing changed yesterday now with some bloody drainage.  Will have nursing change dressing today to dry clean gauze dressing  LABS No results found for this basename: HGB:3,HCT:3,WBC:3,PLT:3 in the last 72 hours  No results found for this basename: NA:3,K:3,BUN:3,CREATININE:3,GLUCOSE:3 in the last 72 hours  No results found for this basename: LABPT:2,INR:2 in the last 72 hours   Assessment/Plan: 3 Days Post-Op Procedure(s) (LRB): AMPUTATION RAY (Right)   Continue ABX therapy due to infected right 1st ray OOB as tolerated Ordered PIC line to be placed tomorrow Current ID recommendations are to continue IV Vanco dosing per pharmacy Probable D/C to home tomorrow per Hewitt's assessment in am

## 2012-07-20 NOTE — Progress Notes (Signed)
Patient ID: Robert Huang, male   DOB: [birth year], 51 y.o.   MRN: [id number]    [facility]    Date of Admission:  07/17/2012           Day 4 vancomycin     . amLODipine  10 mg Oral Daily  . aspirin  325 mg Oral Daily  . atorvastatin  80 mg Oral QHS  . cephALEXin  500 mg Oral Q6H  . cloNIDine  0.3 mg Oral BID  . clopidogrel  75 mg Oral Q breakfast  . docusate sodium  100 mg Oral BID  . fenofibrate  160 mg Oral QHS  . glipiZIDE  10 mg Oral BID AC  . lisinopril  20 mg Oral Daily   And  . hydrochlorothiazide  12.5 mg Oral Daily  . insulin aspart  0-15 Units Subcutaneous TID WC  . levothyroxine  112 mcg Oral Q breakfast  . metFORMIN  1,000 mg Oral BID WC  . metoprolol succinate  100 mg Oral BID  . senna  2 tablet Oral BID  . [DISCONTINUED] vancomycin  1,000 mg Intravenous Q12H    Subjective: He denies any pain  Objective: Temp:  [97.8 F (36.6 C)-98.3 F (36.8 C)] 98.3 F (36.8 C) (11/24 0700) Pulse Rate:  [65] 65  (11/24 0700) Resp:  [16-18] 18  (11/24 0700) BP: (124-133)/(75-78) 128/75 mmHg (11/24 0700) SpO2:  [98 %-100 %] 99 % (11/24 0700)  General: He is in good spirits There is little bit of dried blood on his dressing  Microbiology: Recent Results (from the past 240 hour(s))  SURGICAL PCR SCREEN     Status: Normal   Collection Time   07/16/12  3:50 PM      Component Value Range Status Comment   MRSA, PCR NEGATIVE  NEGATIVE Final    Staphylococcus aureus NEGATIVE  NEGATIVE Final   TISSUE CULTURE     Status: Normal   Collection Time   07/17/12  4:06 PM      Component Value Range Status Comment   Specimen Description TISSUE   Final    Special Requests RIGHT HALLUX PROXIMAL PHALANX   Final    Gram Stain     Final    Value: ABUNDANT WBC PRESENT, PREDOMINANTLY PMN     NO ORGANISMS SEEN   Culture     Final    Value: FEW STAPHYLOCOCCUS AUREUS     Note: RIFAMPIN AND GENTAMICIN SHOULD NOT BE USED AS SINGLE DRUGS FOR TREATMENT OF  STAPH INFECTIONS.   Report Status 07/20/2012 FINAL   Final    Organism ID, Bacteria STAPHYLOCOCCUS AUREUS   Final   ANAEROBIC CULTURE     Status: Normal (Preliminary result)   Collection Time   07/17/12  4:06 PM      Component Value Range Status Comment   Specimen Description TISSUE   Final    Special Requests RIGHT HALLUX PROXIMAL PHALANX   Final    Gram Stain     Final    Value: ABUNDANT WBC PRESENT, PREDOMINANTLY PMN     NO ORGANISMS SEEN   Culture     Final    Value: NO ANAEROBES ISOLATED; CULTURE IN PROGRESS FOR 5 DAYS   Report Status PENDING   Incomplete    Assessment: His operative cultures grew MSSA. I recommend completion of therapy with oral cephalexin for 10-14 more days.  Plan: 1. Change vancomycin to oral cephalexin  Cliffton Asters, MD [facility] for Infectious  Disease [facility] [phone] pager   [phone] cell 07/20/2012, 12:19 PM

## 2012-07-21 LAB — GLUCOSE, CAPILLARY
Glucose-Capillary: 121 mg/dL — ABNORMAL HIGH (ref 70–99)
Glucose-Capillary: 134 mg/dL — ABNORMAL HIGH (ref 70–99)

## 2012-07-21 MED ORDER — OXYCODONE HCL 5 MG PO TABS: 5.0000 mg | ORAL_TABLET | ORAL | Status: DC | PRN

## 2012-07-21 MED ORDER — CEPHALEXIN 500 MG PO CAPS: 500.0000 mg | ORAL_CAPSULE | Freq: Four times a day (QID) | ORAL | Status: DC

## 2012-07-21 NOTE — Progress Notes (Signed)
Pt discharged to home. Discharge instructions and rx given and explained and patient stated understanding. IV was removed. Pt was instructed to follow up with MD Hewitt in 2 weeks. Pt left unit in stable condition via wheelchair with guest services.

## 2012-07-21 NOTE — Discharge Summary (Signed)
Physician Discharge Summary  Patient ID: Robert Huang MRN: [id number] DOB/AGE: [birth year] 51 y.o.  Admit date: 07/17/2012 Discharge date: 07/21/2012  Admission Diagnoses:  CAD, diabetes, osteomyelitis of right hallux s/p open fracture  Discharge Diagnoses:  Same S/p right 1st ray amputation  Discharged Condition: stable  Hospital Course: Pt was admitted on 11/21 and taken to the OR for amputation of his right hallux.  He tolerated the procedure well.  Cultures grew MSSA, and pt was transitioned from IV vanc to oral Keflex. No recurrence of infection after transition to oral abx.  He is discharged to home now in stable condition.  Consults: ID  Significant Diagnostic Studies: labs.  Treatments: antibiotics: vancomycin, keflex  Discharge Exam: Blood pressure 155/79, pulse 64, temperature 98 F (36.7 C), temperature source Oral, resp. rate 18, height 6\' 1"  (1.854 m), weight 106.595 kg (235 lb), SpO2 100.00%. Right foot wound healing with sutures in place.  No signs of infection.  Disposition: 01-Home or Self Care  Discharge Orders    Future Orders Please Complete By Expires   Diet - low sodium heart healthy      Call MD / Call 911      Comments:   If you experience chest pain or shortness of breath, CALL 911 and be transported to the hospital emergency room.  If you develope a fever above 101 F, pus (white drainage) or increased drainage or redness at the wound, or calf pain, call your surgeon's office.   Constipation Prevention      Comments:   Drink plenty of fluids.  Prune juice may be helpful.  You may use a stool softener, such as Colace (over the counter) 100 mg twice a day.  Use MiraLax (over the counter) for constipation as needed.   Increase activity slowly as tolerated          Medication List     As of 07/21/2012  7:33 AM    STOP taking these medications         HYDROcodone-acetaminophen 5-500 MG per tablet   Commonly known as: VICODIN      TAKE these  medications         ALPRAZolam 1 MG tablet   Commonly known as: XANAX   Take 1 mg by mouth 2 (two) times daily as needed. For anxiety.      amLODipine 10 MG tablet   Commonly known as: NORVASC   Take 10 mg by mouth daily.      aspirin 325 MG tablet   Take 325 mg by mouth daily.      atorvastatin 80 MG tablet   Commonly known as: LIPITOR   Take 80 mg by mouth at bedtime.      cephALEXin 500 MG capsule   Commonly known as: KEFLEX   Take 1 capsule (500 mg total) by mouth every 6 (six) hours.      cloNIDine 0.3 MG tablet   Commonly known as: CATAPRES   Take 0.3 mg by mouth 2 (two) times daily.      clopidogrel 75 MG tablet   Commonly known as: PLAVIX   Take 75 mg by mouth daily.      fenofibrate 160 MG tablet   Take 160 mg by mouth at bedtime.      glipiZIDE 10 MG tablet   Commonly known as: GLUCOTROL   Take 10 mg by mouth 2 (two) times daily before a meal.      indomethacin 75 MG CR capsule  Commonly known as: INDOCIN SR   Take 75 mg by mouth daily as needed. For gout.      levothyroxine 112 MCG tablet   Commonly known as: SYNTHROID, LEVOTHROID   Take 112 mcg by mouth daily.      lisinopril-hydrochlorothiazide 20-12.5 MG per tablet   Commonly known as: PRINZIDE,ZESTORETIC   Take 1 tablet by mouth 2 (two) times daily.      metFORMIN 1000 MG tablet   Commonly known as: GLUCOPHAGE   Take 1,000 mg by mouth 2 (two) times daily with a meal.      metoprolol succinate 100 MG 24 hr tablet   Commonly known as: TOPROL-XL   Take 100 mg by mouth 2 (two) times daily. Take with or immediately following a meal.      nitroGLYCERIN 0.4 MG SL tablet   Commonly known as: NITROSTAT   Place 0.4 mg under the tongue every 5 (five) minutes as needed. For chest pain.      oxyCODONE 5 MG immediate release tablet   Commonly known as: Oxy IR/ROXICODONE   Take 1-2 tablets (5-10 mg total) by mouth every 3 (three) hours as needed.      zolpidem 10 MG tablet   Commonly known as: AMBIEN     Take 10 mg by mouth at bedtime as needed. For insomnia.           Follow-up Information    Follow up with Donnika Kucher, Jonny Ruiz, MD. Schedule an appointment as soon as possible for a visit in 2 weeks.   Contact information:   [address] [place] [address] [phone]          Signed: [name] 07/21/2012, 7:33 AM

## 2012-07-22 LAB — ANAEROBIC CULTURE

## 2012-07-28 NOTE — Anesthesia Postprocedure Evaluation (Signed)
  Anesthesia Post-op Note  Patient: Robert Huang  Procedure(s) Performed: Procedure(s) (LRB) with comments: AMPUTATION RAY (Right) - right hallux amputation (1st ray resection )  Patient Location: PACU  Anesthesia Type:General  Level of Consciousness: awake  Airway and Oxygen Therapy: Patient Spontanous Breathing  Post-op Pain: mild  Post-op Assessment: Post-op Vital signs reviewed  Post-op Vital Signs: Reviewed  Complications: No apparent anesthesia complications

## 2012-08-26 ENCOUNTER — Other Ambulatory Visit: Payer: Self-pay | Admitting: Family Medicine

## 2012-09-11 ENCOUNTER — Other Ambulatory Visit: Payer: Self-pay | Admitting: Family Medicine

## 2012-09-12 MED ORDER — METFORMIN HCL 1000 MG PO TABS: 1000.0000 mg | ORAL_TABLET | Freq: Two times a day (BID) | ORAL | Status: DC

## 2012-09-12 MED ORDER — HYDROCODONE-ACETAMINOPHEN 5-325 MG PO TABS: 1.0000 | ORAL_TABLET | Freq: Four times a day (QID) | ORAL | Status: DC | PRN

## 2012-09-12 NOTE — Addendum Note (Signed)
Addended by: Aniceto Boss A on: 09/12/2012 05:09 PM   Modules accepted: Orders

## 2012-09-12 NOTE — Telephone Encounter (Signed)
Change the Vicodin to 5/325 and keep the same directions

## 2012-09-12 NOTE — Telephone Encounter (Signed)
Call in Vicodin #60 with 5 rf and Metformin #60 with 11 rf

## 2012-09-12 NOTE — Telephone Encounter (Signed)
Must change Vicodin dose

## 2012-12-06 ENCOUNTER — Other Ambulatory Visit: Payer: Self-pay | Admitting: Cardiology

## 2012-12-08 ENCOUNTER — Other Ambulatory Visit: Payer: Self-pay | Admitting: Family Medicine

## 2012-12-24 ENCOUNTER — Other Ambulatory Visit: Payer: Managed Care, Other (non HMO)

## 2012-12-26 ENCOUNTER — Other Ambulatory Visit: Payer: Self-pay | Admitting: Cardiology

## 2012-12-31 ENCOUNTER — Ambulatory Visit (INDEPENDENT_AMBULATORY_CARE_PROVIDER_SITE_OTHER): Payer: Managed Care, Other (non HMO) | Admitting: Family Medicine

## 2012-12-31 ENCOUNTER — Encounter: Payer: Self-pay | Admitting: Family Medicine

## 2012-12-31 VITALS — BP 130/82 | HR 64 | Temp 98.8°F | Ht 72.5 in | Wt 241.0 lb

## 2012-12-31 DIAGNOSIS — E119 Type 2 diabetes mellitus without complications: Secondary | ICD-10-CM

## 2012-12-31 DIAGNOSIS — Z0000 Encounter for general adult medical examination without abnormal findings: Secondary | ICD-10-CM

## 2012-12-31 DIAGNOSIS — Z22322 Carrier or suspected carrier of Methicillin resistant Staphylococcus aureus: Secondary | ICD-10-CM

## 2012-12-31 LAB — PSA: PSA: 0.9 ng/mL (ref 0.10–4.00)

## 2012-12-31 LAB — LIPID PANEL
Cholesterol: 275 mg/dL — ABNORMAL HIGH (ref 0–200)
HDL: 40.1 mg/dL (ref >39.00)
Triglycerides: 255 mg/dL — ABNORMAL HIGH (ref 0.0–149.0)
VLDL: 51 mg/dL — ABNORMAL HIGH (ref 0.0–40.0)

## 2012-12-31 LAB — HEPATIC FUNCTION PANEL
Albumin: 3.9 g/dL (ref 3.5–5.2)
Alkaline Phosphatase: 52 U/L (ref 39–117)
Total Protein: 7.3 g/dL (ref 6.0–8.3)

## 2012-12-31 LAB — CBC WITH DIFFERENTIAL/PLATELET
Eosinophils Absolute: 0.1 10*3/uL (ref 0.0–0.7)
Eosinophils Relative: 2.1 % (ref 0.0–5.0)
Lymphocytes Relative: 14.4 % (ref 12.0–46.0)
MCV: 96.1 fl (ref 78.0–100.0)
Monocytes Absolute: 0.6 10*3/uL (ref 0.1–1.0)
Neutrophils Relative %: 72.1 % (ref 43.0–77.0)
Platelets: 233 10*3/uL (ref 150.0–400.0)
WBC: 6.2 10*3/uL (ref 4.5–10.5)

## 2012-12-31 LAB — POCT URINALYSIS DIPSTICK
Blood, UA: NEGATIVE
Glucose, UA: NEGATIVE
Leukocytes, UA: NEGATIVE
Nitrite, UA: NEGATIVE
Urobilinogen, UA: 0.2

## 2012-12-31 LAB — BASIC METABOLIC PANEL
BUN: 13 mg/dL (ref 6–23)
Calcium: 9.2 mg/dL (ref 8.4–10.5)
Creatinine, Ser: 1 mg/dL (ref 0.4–1.5)
GFR: 88.13 mL/min (ref >60.00)
Glucose, Bld: 105 mg/dL — ABNORMAL HIGH (ref 70–99)

## 2012-12-31 LAB — HEMOGLOBIN A1C: Hgb A1c MFr Bld: 6.6 % — ABNORMAL HIGH (ref 4.6–6.5)

## 2012-12-31 NOTE — Progress Notes (Signed)
  Subjective:    Patient ID: Robert Huang, male    DOB: December 18, 1960, 52 y.o.   MRN: [id number]  HPI 52 yr old male for a cpx. He feels well in general. He has recovered well from the amputation of the right great toe due to osteomyelitis from MRSA.    Review of Systems  Constitutional: Negative.   HENT: Negative.   Eyes: Negative.   Respiratory: Negative.   Cardiovascular: Negative.   Gastrointestinal: Negative.   Genitourinary: Negative.   Musculoskeletal: Negative.   Skin: Negative.   Neurological: Negative.   Psychiatric/Behavioral: Negative.        Objective:   Physical Exam  Constitutional: He is oriented to person, place, and time. He appears well-developed and well-nourished. No distress.  HENT:  Head: Normocephalic and atraumatic.  Right Ear: External ear normal.  Left Ear: External ear normal.  Nose: Nose normal.  Mouth/Throat: Oropharynx is clear and moist. No oropharyngeal exudate.  Eyes: Conjunctivae and EOM are normal. Pupils are equal, round, and reactive to light. Right eye exhibits no discharge. Left eye exhibits no discharge. No scleral icterus.  Neck: Neck supple. No JVD present. No tracheal deviation present. No thyromegaly present.  Cardiovascular: Normal rate, regular rhythm, normal heart sounds and intact distal pulses.  Exam reveals no gallop and no friction rub.   No murmur heard. Pulmonary/Chest: Effort normal and breath sounds normal. No respiratory distress. He has no wheezes. He has no rales. He exhibits no tenderness.  Abdominal: Soft. Bowel sounds are normal. He exhibits no distension and no mass. There is no tenderness. There is no rebound and no guarding.  Genitourinary: Rectum normal, prostate normal and penis normal. Guaiac negative stool. No penile tenderness.  Musculoskeletal: Normal range of motion. He exhibits no edema and no tenderness.  Lymphadenopathy:    He has no cervical adenopathy.  Neurological: He is alert and oriented to  person, place, and time. He has normal reflexes. No cranial nerve deficit. He exhibits normal muscle tone. Coordination normal.  Skin: Skin is warm and dry. No rash noted. He is not diaphoretic. No erythema. No pallor.  Psychiatric: He has a normal mood and affect. His behavior is normal. Judgment and thought content normal.          Assessment & Plan:  Well exam. Set up his first colonoscopy.

## 2013-01-01 LAB — LDL CHOLESTEROL, DIRECT: Direct LDL: 197.4 mg/dL

## 2013-01-02 MED ORDER — ROSUVASTATIN CALCIUM 40 MG PO TABS: 40.0000 mg | ORAL_TABLET | Freq: Every day | ORAL | Status: DC

## 2013-01-02 NOTE — Addendum Note (Signed)
Addended by: Azucena Freed on: 01/02/2013 03:42 PM   Modules accepted: Orders

## 2013-01-02 NOTE — Progress Notes (Signed)
Quick Note:  Left a detailed message for pt at designated cell phone number. Rx for crestor sent to pharmacy. ______

## 2013-01-06 ENCOUNTER — Other Ambulatory Visit: Payer: Self-pay | Admitting: Internal Medicine

## 2013-01-08 NOTE — Telephone Encounter (Signed)
Call in #30 with 5 rf 

## 2013-01-27 ENCOUNTER — Telehealth: Payer: Self-pay | Admitting: Family Medicine

## 2013-01-27 MED ORDER — AMLODIPINE BESYLATE 10 MG PO TABS: 10.0000 mg | ORAL_TABLET | Freq: Every day | ORAL | Status: DC

## 2013-01-27 MED ORDER — ALPRAZOLAM 1 MG PO TABS: 1.0000 mg | ORAL_TABLET | Freq: Two times a day (BID) | ORAL | Status: DC | PRN

## 2013-01-27 MED ORDER — GLIPIZIDE 10 MG PO TABS: 10.0000 mg | ORAL_TABLET | Freq: Two times a day (BID) | ORAL | Status: DC

## 2013-01-27 MED ORDER — HYDROCODONE-ACETAMINOPHEN 5-325 MG PO TABS: 1.0000 | ORAL_TABLET | Freq: Four times a day (QID) | ORAL | Status: DC | PRN

## 2013-01-27 NOTE — Telephone Encounter (Signed)
Refill request for Norco 5/325, Xanax 1 mg, Glipizide 10 mg, Amlodipine 10 mg and send all to [facility].

## 2013-01-27 NOTE — Telephone Encounter (Signed)
Refill Glipizide and amlodipine for one year

## 2013-01-27 NOTE — Telephone Encounter (Signed)
Call in Norco #60 with 5 rf, Xanax #60 with 5 rf,

## 2013-01-27 NOTE — Telephone Encounter (Signed)
I sent 2 scripts e-scribe and called in the controlled scripts.

## 2013-02-18 ENCOUNTER — Other Ambulatory Visit: Payer: Self-pay | Admitting: *Deleted

## 2013-02-18 MED ORDER — METOPROLOL SUCCINATE ER 100 MG PO TB24: 100.0000 mg | ORAL_TABLET | Freq: Two times a day (BID) | ORAL | Status: DC

## 2013-06-01 ENCOUNTER — Other Ambulatory Visit: Payer: Self-pay | Admitting: Family Medicine

## 2013-06-01 NOTE — Telephone Encounter (Signed)
Refill request for Indomethacin ER 75 mg and I did send e-scribe to [facility].

## 2013-06-16 ENCOUNTER — Other Ambulatory Visit: Payer: Self-pay

## 2013-06-16 MED ORDER — METOPROLOL SUCCINATE ER 100 MG PO TB24: 100.0000 mg | ORAL_TABLET | Freq: Two times a day (BID) | ORAL | Status: DC

## 2013-06-21 ENCOUNTER — Other Ambulatory Visit: Payer: Self-pay | Admitting: Family Medicine

## 2013-06-22 ENCOUNTER — Other Ambulatory Visit: Payer: Self-pay | Admitting: Family Medicine

## 2013-06-22 NOTE — Telephone Encounter (Signed)
Pt following up on request b/c he is going out of town Wed.  HYDROcodone-acetaminophen (NORCO/VICODIN) 5-325 MG per tablet

## 2013-06-23 MED ORDER — HYDROCODONE-ACETAMINOPHEN 5-325 MG PO TABS: 1.0000 | ORAL_TABLET | Freq: Four times a day (QID) | ORAL | Status: DC | PRN

## 2013-06-23 MED ORDER — ZOLPIDEM TARTRATE 10 MG PO TABS: 10.0000 mg | ORAL_TABLET | Freq: Every evening | ORAL | Status: DC | PRN

## 2013-06-23 NOTE — Telephone Encounter (Signed)
Both are done.

## 2013-06-28 ENCOUNTER — Other Ambulatory Visit: Payer: Self-pay | Admitting: Family Medicine

## 2013-06-30 ENCOUNTER — Other Ambulatory Visit: Payer: Self-pay

## 2013-06-30 MED ORDER — CLOPIDOGREL BISULFATE 75 MG PO TABS: 75.0000 mg | ORAL_TABLET | Freq: Every day | ORAL | Status: DC

## 2013-07-02 ENCOUNTER — Other Ambulatory Visit: Payer: Self-pay

## 2013-07-16 ENCOUNTER — Other Ambulatory Visit: Payer: Self-pay

## 2013-07-16 MED ORDER — CLONIDINE HCL 0.3 MG PO TABS: 0.3000 mg | ORAL_TABLET | Freq: Two times a day (BID) | ORAL | Status: DC

## 2013-08-15 ENCOUNTER — Other Ambulatory Visit: Payer: Self-pay | Admitting: Family Medicine

## 2013-08-17 ENCOUNTER — Other Ambulatory Visit: Payer: Self-pay | Admitting: Family Medicine

## 2013-08-17 NOTE — Telephone Encounter (Signed)
Call in #60 with 5 rf 

## 2013-08-19 ENCOUNTER — Telehealth: Payer: Self-pay | Admitting: Cardiology

## 2013-08-19 NOTE — Telephone Encounter (Signed)
New Problem:  Pt states he is having some left arm discomfort. States his left hand is cold. Pt states he recently checked his BP at [facility] and it was 165/92.  Pt states he just wants to make sure he is ok b/c on Jan 8th he is going out of town with some friends. Pt wants to make sure is healthy enough to enjoy his vacation. He would appreciate a call back.

## 2013-08-19 NOTE — Telephone Encounter (Signed)
Spoke with pt, he is having episodes of left arm discomfort. He reports he woke with the discomfort this am. He has been extremely stressed out by the holiday. He has checked his bp at [facility] and it has ranged from 165/92 to 130/85. He is tired and not sleeping well. He will usually get this discomfort when he is tired. He does report if he raises his arm above his head he can reproduce the discomfort and both arms will go numb. He denies SOB. reassurance given to pt, it does not sound like chest pain. He has taken about a 30 min nap this morning and now he feels fine and is working. He has a follow up appt in jan. He will cont to monitor and call if discomfort changes. He will go to the nearest ER for evaluation if he gets concerned. Patient voiced understanding

## 2013-09-01 ENCOUNTER — Encounter: Payer: Self-pay | Admitting: Family Medicine

## 2013-09-01 ENCOUNTER — Ambulatory Visit (INDEPENDENT_AMBULATORY_CARE_PROVIDER_SITE_OTHER): Payer: Managed Care, Other (non HMO) | Admitting: Family Medicine

## 2013-09-01 VITALS — BP 130/74 | HR 65 | Temp 98.6°F | Wt 225.0 lb

## 2013-09-01 DIAGNOSIS — E119 Type 2 diabetes mellitus without complications: Secondary | ICD-10-CM

## 2013-09-01 DIAGNOSIS — E039 Hypothyroidism, unspecified: Secondary | ICD-10-CM

## 2013-09-01 DIAGNOSIS — E785 Hyperlipidemia, unspecified: Secondary | ICD-10-CM

## 2013-09-01 DIAGNOSIS — I10 Essential (primary) hypertension: Secondary | ICD-10-CM

## 2013-09-01 DIAGNOSIS — Z23 Encounter for immunization: Secondary | ICD-10-CM

## 2013-09-01 LAB — LIPID PANEL
Cholesterol: 231 mg/dL — ABNORMAL HIGH (ref 0–200)
HDL: 23.6 mg/dL — ABNORMAL LOW (ref >39.00)
Total CHOL/HDL Ratio: 10
Triglycerides: 374 mg/dL — ABNORMAL HIGH (ref 0.0–149.0)
VLDL: 74.8 mg/dL — ABNORMAL HIGH (ref 0.0–40.0)

## 2013-09-01 LAB — HEPATIC FUNCTION PANEL
ALK PHOS: 55 U/L (ref 39–117)
ALT: 119 U/L — ABNORMAL HIGH (ref 0–53)
AST: 114 U/L — ABNORMAL HIGH (ref 0–37)
Albumin: 3.9 g/dL (ref 3.5–5.2)
BILIRUBIN TOTAL: 0.5 mg/dL (ref 0.3–1.2)
Bilirubin, Direct: 0 mg/dL (ref 0.0–0.3)
Total Protein: 7.7 g/dL (ref 6.0–8.3)

## 2013-09-01 LAB — TSH: TSH: 0.25 u[IU]/mL — ABNORMAL LOW (ref 0.35–5.50)

## 2013-09-01 LAB — LDL CHOLESTEROL, DIRECT: Direct LDL: 142.6 mg/dL

## 2013-09-01 LAB — HEMOGLOBIN A1C: HEMOGLOBIN A1C: 6.4 % (ref 4.6–6.5)

## 2013-09-01 MED ORDER — INDOMETHACIN ER 75 MG PO CPCR: 75.0000 mg | ORAL_CAPSULE | Freq: Two times a day (BID) | ORAL | Status: DC

## 2013-09-01 NOTE — Progress Notes (Signed)
Pre visit review using our clinic review tool, if applicable. No additional management support is needed unless otherwise documented below in the visit note. 

## 2013-09-01 NOTE — Addendum Note (Signed)
Addended by: Aniceto BossNIMMONS, Donye Dauenhauer A on: 09/01/2013 12:46 PM   Modules accepted: Orders

## 2013-09-01 NOTE — Progress Notes (Signed)
   Subjective:    Patient ID: Owens Sharklan T Eden, male    DOB: 03-04-1961, 53 y.o.   MRN: [id number]  HPI Here for lots of issues. 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 Second he wants to check his cholesterol, his A1c and his thyroid levels. He thinks the Levothyroxine dose is too low because he feels sluggish and lightheaded at times. He needs some refills. He has lost 20 lbs in the past year by eating smarter and by stopping all alcohol use. He used to drink a fair amount of beer daily.    Review of Systems  Constitutional: Negative.   Respiratory: Negative.   Cardiovascular: Negative.        Objective:   Physical Exam  Constitutional: He is oriented to person, place, and time. He appears well-developed and well-nourished.  Cardiovascular: Normal rate, regular rhythm, normal heart sounds and intact distal pulses.   Pulmonary/Chest: Effort normal and breath sounds normal.  Neurological: He is alert and oriented to person, place, and time.          Assessment & Plan:  Check labs including a TSH. Given the immunizations below, and he will return next week for a PPD.

## 2013-09-01 NOTE — Addendum Note (Signed)
Addended by: Aniceto BossNIMMONS, SYLVIA A on: 09/01/2013 01:25 PM   Modules accepted: Orders

## 2013-09-02 ENCOUNTER — Ambulatory Visit (INDEPENDENT_AMBULATORY_CARE_PROVIDER_SITE_OTHER): Payer: Managed Care, Other (non HMO) | Admitting: Physician Assistant

## 2013-09-02 ENCOUNTER — Encounter: Payer: Self-pay | Admitting: Physician Assistant

## 2013-09-02 VITALS — BP 130/80 | HR 64 | Ht 73.0 in | Wt 226.8 lb

## 2013-09-02 DIAGNOSIS — E785 Hyperlipidemia, unspecified: Secondary | ICD-10-CM

## 2013-09-02 DIAGNOSIS — E039 Hypothyroidism, unspecified: Secondary | ICD-10-CM

## 2013-09-02 DIAGNOSIS — I10 Essential (primary) hypertension: Secondary | ICD-10-CM

## 2013-09-02 DIAGNOSIS — I2510 Atherosclerotic heart disease of native coronary artery without angina pectoris: Secondary | ICD-10-CM

## 2013-09-02 MED ORDER — NITROGLYCERIN 0.4 MG SL SUBL: 0.4000 mg | SUBLINGUAL_TABLET | SUBLINGUAL | Status: DC | PRN

## 2013-09-02 MED ORDER — METOPROLOL SUCCINATE ER 100 MG PO TB24: 100.0000 mg | ORAL_TABLET | Freq: Two times a day (BID) | ORAL | Status: DC

## 2013-09-02 MED ORDER — CLONIDINE HCL 0.3 MG PO TABS: 0.3000 mg | ORAL_TABLET | Freq: Two times a day (BID) | ORAL | Status: DC

## 2013-09-02 NOTE — Assessment & Plan Note (Signed)
Patient had stenting to the circumflex and RCA in 2010. He had normal LV function. He had a normal Lexi scan in November 2013. He has no symptoms of chest pain. No further testing indicated at this time. Followup with Dr. Jens Somrenshaw in one year.

## 2013-09-02 NOTE — Assessment & Plan Note (Signed)
Patient's thyroid is very low at 0.25. Dr. Clent RidgesFry is to adjust his medication. I suspect this is the cause of most his symptoms of dizziness, palpitations and numbness in his arm. His symptoms have resolved since he cut back his thyroid dose on his own.

## 2013-09-02 NOTE — Assessment & Plan Note (Signed)
Patient's lipid panel is abnormal but he's been off his Crestor. He also has an increase in his LFTs probably from excessive alcohol. He has quit but is going on a hunting trip and will be drinking this week. Dr. Clent RidgesFry manages his. Samples of Crestor given.

## 2013-09-02 NOTE — Patient Instructions (Addendum)
Your physician wants you to follow-up in: 1 year with Dr. Jens Somrenshaw. You will receive a reminder letter in the mail two months in advance. If you don't receive a letter, please call our office to schedule the follow-up appointment.  Follow up with Dr. Abran CantorFrye concerning cholesterol and thyroid.   Samples of #36 of  Crestor 20 mg given to you today. You will take 2 tablets daily.   Refilled Metoprolol, Clonidine and Nitro.

## 2013-09-02 NOTE — Assessment & Plan Note (Signed)
Controlled.  

## 2013-09-02 NOTE — Progress Notes (Signed)
HPI:  [name] is a  gentleman patient of Dr. Jens Huang with a history of coronary artery disease,  hypertension, diabetes, and hyperlipidemia.  He had a drug-eluting stent to his circumflex and a drug-eluting stent to his right coronary artery in March of 2010. His LV function is normal. Myoview in December 2010 showed an ejection fraction of 57% and normal perfusion. Abdominal ultrasound in December 2010 showed no aneurysm and no renal artery stenosis. He was last seen in 2013 and had a stress Myoview in 06/2012 that was normal.   He called in December complaining of dizziness, palpitations, and numbness of his left arm. He said it was the same sensation he had when he was diagnosed with thyroid problems so he cut his thyroid medicine in half and within 3 days symptom free. He walks 2 miles per day and had no symptoms with exercise. He was under a lot of stress through the holidays and wanted to make sure this was not heart related. He says he quit drinking beer December 20 because he was drinking way too much and was becoming a problem. He is going on a hunting trip tomorrow and says he will be drinking again but in moderation. He also lost 10-15 pounds in a weeks time and is wondering if this is thyroid related or secondary to giving up his alcohol. He denies any chest tightness, dyspnea, dyspnea on exertion, dizziness, or presyncope.  He saw Dr. Clent Huang yesterday who did complete blood work and his thyroid is low at 0.25. He was not taking his Crestor because he could not afford it but is now back on it. His cholesterol is 231 triglycerides over 300. We will give him samples if available. Dr. Clent Huang manages his lipids.  No Known Allergies  Current Outpatient Prescriptions on File Prior to Visit: ALPRAZolam (XANAX) 1 MG tablet, TAKE 1 TABLET BY MOUTH TWICE DAILY AS NEEDED, Disp: 60 tablet, Rfl: 5 amLODipine (NORVASC) 10 MG tablet, TAKE 1 TABLET BY MOUTH EVERY DAY, Disp: 30 tablet, Rfl: 6 aspirin 325 MG  tablet, Take 325 mg by mouth daily., Disp: , Rfl:  atorvastatin (LIPITOR) 80 MG tablet, Take 80 mg by mouth at bedtime., Disp: , Rfl:  cloNIDine (CATAPRES) 0.3 MG tablet, Take 1 tablet (0.3 mg total) by mouth 2 (two) times daily., Disp: 60 tablet, Rfl: 1 clopidogrel (PLAVIX) 75 MG tablet, Take 1 tablet (75 mg total) by mouth daily., Disp: 30 tablet, Rfl: 2 fenofibrate 160 MG tablet, Take 160 mg by mouth at bedtime., Disp: , Rfl:  glipiZIDE (GLUCOTROL) 10 MG tablet, Take 10 mg by mouth 2 (two) times daily as needed., Disp: , Rfl:  HYDROcodone-acetaminophen (NORCO) 5-325 MG per tablet, Take 1 tablet by mouth every 6 (six) hours as needed for pain., Disp: 60 tablet, Rfl: 0 indomethacin (INDOCIN SR) 75 MG CR capsule, Take 1 capsule (75 mg total) by mouth 2 (two) times daily with a meal., Disp: 60 capsule, Rfl: 11 levothyroxine (SYNTHROID, LEVOTHROID) 112 MCG tablet, TAKE 1 TABLET BY MOUTH DAILY, Disp: 30 tablet, Rfl: 6 lisinopril-hydrochlorothiazide (PRINZIDE,ZESTORETIC) 20-12.5 MG per tablet, Take 1 tablet by mouth 2 (two) times daily., Disp: , Rfl:  metFORMIN (GLUCOPHAGE) 1000 MG tablet, Take 1 tablet (1,000 mg total) by mouth 2 (two) times daily with a meal., Disp: 60 tablet, Rfl: 11 metoprolol succinate (TOPROL-XL) 100 MG 24 hr tablet, Take 1 tablet (100 mg total) by mouth 2 (two) times daily. Take with or immediately following a meal., Disp: 60 tablet, Rfl: 2  nitroGLYCERIN (NITROSTAT) 0.4 MG SL tablet, Place 0.4 mg under the tongue every 5 (five) minutes as needed. For chest pain., Disp: , Rfl:  ONE TOUCH ULTRA TEST test strip, USE DAILY AS DIRECTED, Disp: 100 each, Rfl: 1 rosuvastatin (CRESTOR) 40 MG tablet, Take 1 tablet (40 mg total) by mouth daily., Disp: 90 tablet, Rfl: 3  No current facility-administered medications on file prior to visit.   Past Medical History:   Hyperlipidemia                                               Hypertension                                                  CAD (coronary artery disease)                                  Comment:sees Dr. [name], normal Stress test 07-16-12    DJD (degenerative joint disease)                             Hypothyroid                                                  Gout                                                         Anxiety                                                      Osteoarthritis                                               Diabetes mellitus                                            Renal calculus                                                 Comment:hx   Umbilical hernia  Past Surgical History:   I & D Toe                                        05/19/2012      Comment:rt great toe   CORONARY ANGIOPLASTY WITH STENT PLACEMENT                     I&D EXTREMITY                                    05/19/2012      Comment:Procedure: IRRIGATION AND DEBRIDEMENT               EXTREMITY;  Surgeon: [name], MD;                Location: [facility] OR;  Service: Orthopedics;                Laterality: Right;  Right Great toe   TONSILLECTOMY                                                 FINGER SURGERY                                                  Comment:left index finger   AMPUTATION                                       07/17/2012     Comment:Procedure: AMPUTATION RAY;  Surgeon: [name], MD;  Location: [facility] OR;  Service:               Orthopedics;  Laterality: Right;  right hallux               amputation (1st ray resection )  Review of patient's family history indicates:   Arthritis                                               Coronary artery disease                                 Diabetes                                                Hypertension                                            Prostate cancer  Stroke                                                  Social History   Marital  Status: Married             Spouse Name:                      Years of Education:                 Number of children:             Occupational History   None on file  Social History Main Topics   Smoking Status: Never Smoker                     Smokeless Status: Current User                       Types: Chew   Comment: occ   Alcohol Use: Yes           0.0 oz/week      Comment: social   Drug Use: No             Sexual Activity: Yes                Other Topics            Concern   None on file  Social History Narrative   None on file    ROS: See history of present illness otherwise negative   PHYSICAL EXAM: Well-nournished, in no acute distress. Neck: No JVD, HJR, Bruit, or thyroid enlargement  Lungs: No tachypnea, clear without wheezing, rales, or rhonchi  Cardiovascular: RRR, PMI not displaced, heart sounds normal, no murmurs, gallops, bruit, thrill, or heave.  Abdomen: BS normal. Soft without organomegaly, masses, lesions or tenderness.  Extremities: without cyanosis, clubbing or edema. Good distal pulses bilateral, missing right great toe secondary to surgery  SKin: Warm, no lesions or rashes   Musculoskeletal: No deformities  Neuro: no focal signs  BP 130/80  Pulse 64  Ht 6\' 1"  (1.854 m)  Wt 226 lb 12.8 oz (102.876 kg)  BMI 29.93 kg/m2    EKG: Normal sinus rhythm with LVH, no acute change  Lexiscan 06/2012: Impression Exercise Capacity:  Lexiscan with no exercise. BP Response:  Normal blood pressure response. Clinical Symptoms:  There is dyspnea. ECG Impression:  No significant ST segment change suggestive of ischemia. Comparison with Prior Nuclear Study: No images to compare  Overall Impression:  Normal stress nuclear study.  LV Ejection Fraction: 61%.  LV Wall Motion:  NL LV Function; NL Wall Motion  Cath 2010:  CONCLUSION:  1. Coronary artery disease with 40% narrowing in the distal left main,      30% ostial and 30% proximal and 50% mid  stenosis in the LAD, 40%      proximal, 40% distal, and 90% mid stenosis in the circumflex artery      and 80% mid and 80% distal stenosis in the right coronary with      normal LV function.  2. Successful PCI of the lesion in the mid circumflex artery using a      XIENCE drug-eluting stent with improvement in central narrowing      from 90%  to 0%.

## 2013-09-08 ENCOUNTER — Encounter: Payer: Self-pay | Admitting: Family Medicine

## 2013-09-09 ENCOUNTER — Telehealth: Payer: Self-pay | Admitting: Family Medicine

## 2013-09-09 NOTE — Telephone Encounter (Signed)
Pt needs re-fill of indomethacin (INDOCIN SR) 75 MG CR capsule, he has 1 pill left.  [facility] [address]

## 2013-09-09 NOTE — Telephone Encounter (Signed)
I spoke with pharmacy and pt does have this script ready to pick up and I also spoke with pt.

## 2013-09-11 ENCOUNTER — Ambulatory Visit (INDEPENDENT_AMBULATORY_CARE_PROVIDER_SITE_OTHER): Payer: Managed Care, Other (non HMO) | Admitting: Family Medicine

## 2013-09-11 DIAGNOSIS — Z111 Encounter for screening for respiratory tuberculosis: Secondary | ICD-10-CM

## 2013-09-11 NOTE — Telephone Encounter (Signed)
His Synthroid dosage is just right at this time. Recheck labs in 90 days

## 2013-09-12 ENCOUNTER — Other Ambulatory Visit: Payer: Self-pay | Admitting: Cardiology

## 2013-09-14 LAB — TB SKIN TEST
Induration: 0 mm
TB SKIN TEST: NEGATIVE

## 2013-10-08 ENCOUNTER — Other Ambulatory Visit: Payer: Self-pay | Admitting: Family Medicine

## 2013-10-12 ENCOUNTER — Other Ambulatory Visit: Payer: Self-pay | Admitting: Family Medicine

## 2013-10-12 ENCOUNTER — Other Ambulatory Visit: Payer: Self-pay | Admitting: Cardiology

## 2013-10-13 ENCOUNTER — Other Ambulatory Visit: Payer: Self-pay | Admitting: Cardiology

## 2013-10-14 ENCOUNTER — Other Ambulatory Visit: Payer: Self-pay | Admitting: Cardiology

## 2013-10-20 ENCOUNTER — Other Ambulatory Visit: Payer: Self-pay | Admitting: *Deleted

## 2013-10-20 MED ORDER — METOPROLOL SUCCINATE ER 100 MG PO TB24: 100.0000 mg | ORAL_TABLET | Freq: Two times a day (BID) | ORAL | Status: DC

## 2013-10-29 ENCOUNTER — Telehealth: Payer: Self-pay | Admitting: Family Medicine

## 2013-10-29 NOTE — Telephone Encounter (Signed)
Pt need re-fill of HYDROcodone-acetaminophen (NORCO) 5-325 MG per tablet

## 2013-10-30 MED ORDER — HYDROCODONE-ACETAMINOPHEN 5-325 MG PO TABS: 1.0000 | ORAL_TABLET | Freq: Four times a day (QID) | ORAL | Status: DC | PRN

## 2013-10-30 NOTE — Telephone Encounter (Signed)
done

## 2013-10-30 NOTE — Telephone Encounter (Signed)
Script is ready for pick up and I left a voice message for pt. 

## 2014-01-06 ENCOUNTER — Other Ambulatory Visit: Payer: Self-pay | Admitting: Family Medicine

## 2014-01-07 ENCOUNTER — Other Ambulatory Visit: Payer: Self-pay | Admitting: Family Medicine

## 2014-01-11 ENCOUNTER — Other Ambulatory Visit: Payer: Self-pay | Admitting: *Deleted

## 2014-01-11 MED ORDER — FENOFIBRATE 160 MG PO TABS: 160.0000 mg | ORAL_TABLET | Freq: Every day | ORAL | Status: DC

## 2014-01-13 ENCOUNTER — Telehealth: Payer: Self-pay | Admitting: Family Medicine

## 2014-01-13 NOTE — Telephone Encounter (Signed)
Call in #30 with 5 rf 

## 2014-01-13 NOTE — Telephone Encounter (Signed)
[facility] - [place], [place] - [address] requesting refill of zolpidem (AMBIEN) 10 MG tablet

## 2014-01-14 MED ORDER — ZOLPIDEM TARTRATE 10 MG PO TABS: 10.0000 mg | ORAL_TABLET | Freq: Every evening | ORAL | Status: DC | PRN

## 2014-01-14 NOTE — Telephone Encounter (Signed)
I called in script 

## 2014-01-29 ENCOUNTER — Other Ambulatory Visit: Payer: Self-pay | Admitting: Family Medicine

## 2014-02-01 NOTE — Telephone Encounter (Signed)
I called in the refill for Xanax to the pts pharmacy and the refills on Prinzide and Synthroid were sent via e-scribe.

## 2014-02-01 NOTE — Telephone Encounter (Signed)
Call in Xanax #[phone] with 5rf, also one year of Prinzide and Synthroid

## 2014-02-18 ENCOUNTER — Telehealth: Payer: Self-pay | Admitting: Family Medicine

## 2014-02-18 NOTE — Telephone Encounter (Signed)
Pt is needing new rx HYDROcodone-acetaminophen (NORCO) 5-325 MG per tablet, please call when available for pick up. ° °

## 2014-02-19 MED ORDER — HYDROCODONE-ACETAMINOPHEN 5-325 MG PO TABS: 1.0000 | ORAL_TABLET | Freq: Four times a day (QID) | ORAL | Status: DC | PRN

## 2014-02-19 NOTE — Telephone Encounter (Signed)
done

## 2014-02-23 ENCOUNTER — Other Ambulatory Visit: Payer: Self-pay | Admitting: Family Medicine

## 2014-02-25 ENCOUNTER — Other Ambulatory Visit: Payer: Self-pay | Admitting: Family Medicine

## 2014-02-27 ENCOUNTER — Other Ambulatory Visit: Payer: Self-pay | Admitting: Family Medicine

## 2014-04-29 ENCOUNTER — Telehealth: Payer: Self-pay | Admitting: Family Medicine

## 2014-04-29 NOTE — Telephone Encounter (Signed)
Pt request refill of the following:HYDROcodone-acetaminophen (NORCO) 5-325 MG per tablet    Phamacy:   Pick up

## 2014-04-30 MED ORDER — HYDROCODONE-ACETAMINOPHEN 5-325 MG PO TABS: 1.0000 | ORAL_TABLET | Freq: Four times a day (QID) | ORAL | Status: DC | PRN

## 2014-04-30 NOTE — Telephone Encounter (Signed)
Script is ready for pick up and I spoke with pt.  

## 2014-04-30 NOTE — Telephone Encounter (Signed)
done

## 2014-06-11 ENCOUNTER — Telehealth: Payer: Self-pay | Admitting: Family Medicine

## 2014-06-11 ENCOUNTER — Other Ambulatory Visit: Payer: Self-pay

## 2014-06-11 MED ORDER — CLOPIDOGREL BISULFATE 75 MG PO TABS
75.0000 mg | ORAL_TABLET | Freq: Once | ORAL | Status: DC
Start: 2014-06-11 — End: 2014-12-08

## 2014-06-11 MED ORDER — ROSUVASTATIN CALCIUM 40 MG PO TABS
ORAL_TABLET | ORAL | Status: DC
Start: 2014-06-11 — End: 2014-11-05

## 2014-06-11 MED ORDER — CLONIDINE HCL 0.3 MG PO TABS: 0.3000 mg | ORAL_TABLET | Freq: Two times a day (BID) | ORAL | Status: DC

## 2014-06-11 MED ORDER — NITROGLYCERIN 0.4 MG SL SUBL: 0.4000 mg | SUBLINGUAL_TABLET | SUBLINGUAL | Status: DC | PRN

## 2014-06-11 MED ORDER — ZOLPIDEM TARTRATE 10 MG PO TABS: 10.0000 mg | ORAL_TABLET | Freq: Every evening | ORAL | Status: DC | PRN

## 2014-06-11 MED ORDER — FENOFIBRATE 160 MG PO TABS
160.0000 mg | ORAL_TABLET | Freq: Every day | ORAL | Status: DC
Start: 2014-06-11 — End: 2014-11-05

## 2014-06-11 MED ORDER — ALPRAZOLAM 1 MG PO TABS: ORAL_TABLET | ORAL | Status: DC

## 2014-06-11 MED ORDER — METOPROLOL SUCCINATE ER 100 MG PO TB24: 100.0000 mg | ORAL_TABLET | Freq: Two times a day (BID) | ORAL | Status: DC

## 2014-06-11 NOTE — Telephone Encounter (Signed)
I faxed scripts for all 3 of the below medications, tried to reach pt by phone and no answer.

## 2014-06-11 NOTE — Telephone Encounter (Signed)
Pt came in to ask that we begin sending all his Rx's to [facility] mail order pharmacy from now on. He needs his Crestor, Ambien, and Alprazolam filled urgently as he is running out in the next few days. He was unaware that he needed to use mail order or pay a high amount to pick up at the pharmacy. He dropped off a sheet with the info as well and we will have it scanned in. The phone # for [facility] is [phone] and the fax is [phone].

## 2014-06-11 NOTE — Telephone Encounter (Signed)
Can we refill the Ambien, Crestor, & Alprazolam?

## 2014-06-11 NOTE — Telephone Encounter (Signed)
Ready to be faxed.

## 2014-06-24 ENCOUNTER — Other Ambulatory Visit: Payer: Self-pay | Admitting: Family Medicine

## 2014-11-01 ENCOUNTER — Telehealth: Payer: Self-pay | Admitting: Family Medicine

## 2014-11-01 NOTE — Telephone Encounter (Signed)
Pt needs refills for alprazolam 1mg , amlodipine 10mg . crestor 40mg , indomethacin er 75mg , levothyroxine .122mg , lisinopril 20/12.5mg  and zolpidem 10mg  all sent to [facility]. Phone is [phone] and fax is [phone]. He als needs a rx for hydrocodone 5/325mg  to pick up (last rx was done on 05/05/14) and he needs his metformin 1000mg  and his glipizide 10mg  to both remain at [facility] and not go to [facility], as they are free at [facility].

## 2014-11-01 NOTE — Telephone Encounter (Signed)
He needs an OV for this. We have not seen him in over a year

## 2014-11-02 NOTE — Telephone Encounter (Signed)
Pt scheduled cpe for 4/5. This was first available.  Do we need to create a slot to get him in sooner?

## 2014-11-02 NOTE — Telephone Encounter (Signed)
Can you call pt to schedule a office visit? 

## 2014-11-02 NOTE — Telephone Encounter (Signed)
Please create a slot to get him in sooner (this week if needed)

## 2014-11-05 ENCOUNTER — Ambulatory Visit (INDEPENDENT_AMBULATORY_CARE_PROVIDER_SITE_OTHER): Payer: BLUE CROSS/BLUE SHIELD | Admitting: Family Medicine

## 2014-11-05 ENCOUNTER — Encounter: Payer: Self-pay | Admitting: Family Medicine

## 2014-11-05 VITALS — BP 123/63 | HR 68 | Temp 98.6°F | Ht 73.0 in | Wt 233.0 lb

## 2014-11-05 DIAGNOSIS — M1000 Idiopathic gout, unspecified site: Secondary | ICD-10-CM

## 2014-11-05 DIAGNOSIS — Z0000 Encounter for general adult medical examination without abnormal findings: Secondary | ICD-10-CM

## 2014-11-05 DIAGNOSIS — E119 Type 2 diabetes mellitus without complications: Secondary | ICD-10-CM

## 2014-11-05 LAB — MICROALBUMIN / CREATININE URINE RATIO
Creatinine,U: 230.6 mg/dL
Microalb Creat Ratio: 0.7 mg/g (ref 0.0–30.0)
Microalb, Ur: 1.7 mg/dL (ref 0.0–1.9)

## 2014-11-05 LAB — HEPATIC FUNCTION PANEL
ALBUMIN: 4 g/dL (ref 3.5–5.2)
ALK PHOS: 63 U/L (ref 39–117)
ALT: 41 U/L (ref 0–53)
AST: 38 U/L — ABNORMAL HIGH (ref 0–37)
BILIRUBIN DIRECT: 0.1 mg/dL (ref 0.0–0.3)
Total Bilirubin: 0.3 mg/dL (ref 0.2–1.2)
Total Protein: 7.3 g/dL (ref 6.0–8.3)

## 2014-11-05 LAB — CBC WITH DIFFERENTIAL/PLATELET
BASOS PCT: 1.2 % (ref 0.0–3.0)
Basophils Absolute: 0.1 10*3/uL (ref 0.0–0.1)
EOS PCT: 2.7 % (ref 0.0–5.0)
Eosinophils Absolute: 0.1 10*3/uL (ref 0.0–0.7)
HEMATOCRIT: 37.4 % — AB (ref 39.0–52.0)
HEMOGLOBIN: 12.5 g/dL — AB (ref 13.0–17.0)
LYMPHS ABS: 0.7 10*3/uL (ref 0.7–4.0)
Lymphocytes Relative: 14.4 % (ref 12.0–46.0)
MCHC: 33.5 g/dL (ref 30.0–36.0)
MCV: 89.7 fl (ref 78.0–100.0)
Monocytes Absolute: 0.7 10*3/uL (ref 0.1–1.0)
Monocytes Relative: 15.1 % — ABNORMAL HIGH (ref 3.0–12.0)
Neutro Abs: 3.2 10*3/uL (ref 1.4–7.7)
Neutrophils Relative %: 66.6 % (ref 43.0–77.0)
PLATELETS: 246 10*3/uL (ref 150.0–400.0)
RBC: 4.17 Mil/uL — AB (ref 4.22–5.81)
RDW: 14.4 % (ref 11.5–15.5)
WBC: 4.8 10*3/uL (ref 4.0–10.5)

## 2014-11-05 LAB — BASIC METABOLIC PANEL
BUN: 18 mg/dL (ref 6–23)
CALCIUM: 9.1 mg/dL (ref 8.4–10.5)
CO2: 28 meq/L (ref 19–32)
Chloride: 100 mEq/L (ref 96–112)
Creatinine, Ser: 1.27 mg/dL (ref 0.40–1.50)
GFR: 62.6 mL/min (ref >60.00)
GLUCOSE: 69 mg/dL — AB (ref 70–99)
Potassium: 4.3 mEq/L (ref 3.5–5.1)
Sodium: 136 mEq/L (ref 135–145)

## 2014-11-05 LAB — URIC ACID: Uric Acid, Serum: 12.7 mg/dL — ABNORMAL HIGH (ref 4.0–7.8)

## 2014-11-05 LAB — LIPID PANEL
CHOLESTEROL: 345 mg/dL — AB (ref 0–200)
HDL: 38 mg/dL — AB (ref >39.00)
Total CHOL/HDL Ratio: 9

## 2014-11-05 LAB — PSA: PSA: 1.71 ng/mL (ref 0.10–4.00)

## 2014-11-05 LAB — LDL CHOLESTEROL, DIRECT: Direct LDL: 222 mg/dL

## 2014-11-05 LAB — TSH: TSH: 2.31 u[IU]/mL (ref 0.35–4.50)

## 2014-11-05 LAB — HEMOGLOBIN A1C: HEMOGLOBIN A1C: 6.5 % (ref 4.6–6.5)

## 2014-11-05 MED ORDER — LISINOPRIL-HYDROCHLOROTHIAZIDE 20-12.5 MG PO TABS: ORAL_TABLET | ORAL | Status: DC

## 2014-11-05 MED ORDER — AMLODIPINE BESYLATE 10 MG PO TABS: ORAL_TABLET | ORAL | Status: DC

## 2014-11-05 MED ORDER — ROSUVASTATIN CALCIUM 40 MG PO TABS: ORAL_TABLET | ORAL | Status: DC

## 2014-11-05 MED ORDER — METOPROLOL SUCCINATE ER 100 MG PO TB24: 100.0000 mg | ORAL_TABLET | Freq: Two times a day (BID) | ORAL | Status: DC

## 2014-11-05 MED ORDER — ZOLPIDEM TARTRATE 10 MG PO TABS: 10.0000 mg | ORAL_TABLET | Freq: Every evening | ORAL | Status: DC | PRN

## 2014-11-05 MED ORDER — FENOFIBRATE 160 MG PO TABS: 160.0000 mg | ORAL_TABLET | Freq: Every day | ORAL | Status: DC

## 2014-11-05 MED ORDER — GLIPIZIDE 10 MG PO TABS: ORAL_TABLET | ORAL | Status: DC

## 2014-11-05 MED ORDER — ASPIRIN-ACETAMINOPHEN-CAFFEINE 500-325-65 MG PO PACK: 1.0000 | PACK | Freq: Every day | ORAL | Status: DC

## 2014-11-05 MED ORDER — HYDROCODONE-ACETAMINOPHEN 5-325 MG PO TABS: 1.0000 | ORAL_TABLET | Freq: Four times a day (QID) | ORAL | Status: DC | PRN

## 2014-11-05 MED ORDER — INDOMETHACIN ER 75 MG PO CPCR: 75.0000 mg | ORAL_CAPSULE | Freq: Two times a day (BID) | ORAL | Status: DC

## 2014-11-05 MED ORDER — ATORVASTATIN CALCIUM 80 MG PO TABS: 80.0000 mg | ORAL_TABLET | Freq: Every day | ORAL | Status: DC

## 2014-11-05 MED ORDER — ALPRAZOLAM 1 MG PO TABS: 1.0000 mg | ORAL_TABLET | Freq: Three times a day (TID) | ORAL | Status: DC | PRN

## 2014-11-05 MED ORDER — METFORMIN HCL 1000 MG PO TABS: ORAL_TABLET | ORAL | Status: DC

## 2014-11-05 NOTE — Progress Notes (Signed)
Pre visit review using our clinic review tool, if applicable. No additional management support is needed unless otherwise documented below in the visit note. 

## 2014-11-08 ENCOUNTER — Telehealth: Payer: Self-pay | Admitting: Family Medicine

## 2014-11-08 ENCOUNTER — Encounter: Payer: Self-pay | Admitting: Family Medicine

## 2014-11-08 NOTE — Progress Notes (Signed)
   Subjective:    Patient ID: Robert Huang, male    DOB: [birth year], 54 y.o.   MRN: [id number]  HPI 54 yr old male for a cpx. He feels fine physically but he has been under stress lately. He lost his job last July and he has been unable to find work ever since. His glucoses have been stable and his BP has been stable.   Review of Systems  Constitutional: Negative.   HENT: Negative.   Eyes: Negative.   Respiratory: Negative.   Cardiovascular: Negative.   Gastrointestinal: Negative.   Genitourinary: Negative.   Musculoskeletal: Negative.   Skin: Negative.   Neurological: Negative.   Psychiatric/Behavioral: Negative.        Objective:   Physical Exam  Constitutional: He is oriented to person, place, and time. He appears well-developed and well-nourished. No distress.  HENT:  Head: Normocephalic and atraumatic.  Right Ear: External ear normal.  Left Ear: External ear normal.  Nose: Nose normal.  Mouth/Throat: Oropharynx is clear and moist. No oropharyngeal exudate.  Eyes: Conjunctivae and EOM are normal. Pupils are equal, round, and reactive to light. Right eye exhibits no discharge. Left eye exhibits no discharge. No scleral icterus.  Neck: Neck supple. No JVD present. No tracheal deviation present. No thyromegaly present.  Cardiovascular: Normal rate, regular rhythm, normal heart sounds and intact distal pulses.  Exam reveals no gallop and no friction rub.   No murmur heard. Pulmonary/Chest: Effort normal and breath sounds normal. No respiratory distress. He has no wheezes. He has no rales. He exhibits no tenderness.  Abdominal: Soft. Bowel sounds are normal. He exhibits no distension and no mass. There is no tenderness. There is no rebound and no guarding.  Genitourinary: Rectum normal, prostate normal and penis normal. Guaiac negative stool. No penile tenderness.  Musculoskeletal: Normal range of motion. He exhibits no edema or tenderness.  Lymphadenopathy:    He has no  cervical adenopathy.  Neurological: He is alert and oriented to person, place, and time. He has normal reflexes. No cranial nerve deficit. He exhibits normal muscle tone. Coordination normal.  Skin: Skin is warm and dry. No rash noted. He is not diaphoretic. No erythema. No pallor.  Psychiatric: He has a normal mood and affect. His behavior is normal. Judgment and thought content normal.          Assessment & Plan:  Well exam. Get fasting labs today. Set up a colonoscopy.

## 2014-11-08 NOTE — Telephone Encounter (Signed)
emmi emailed °

## 2014-11-09 NOTE — Telephone Encounter (Signed)
Pt had been scheduled

## 2014-11-13 ENCOUNTER — Other Ambulatory Visit: Payer: Self-pay | Admitting: Cardiology

## 2014-11-22 ENCOUNTER — Telehealth: Payer: Self-pay | Admitting: Family Medicine

## 2014-11-22 NOTE — Telephone Encounter (Signed)
Should pt be taking Crestor or Lipitor? Waiting on a response from pt through my chart.

## 2014-11-23 ENCOUNTER — Other Ambulatory Visit: Payer: Self-pay

## 2014-11-23 NOTE — Telephone Encounter (Signed)
I left a voice message for pt to return my call.  

## 2014-11-25 ENCOUNTER — Encounter: Payer: Self-pay | Admitting: Family Medicine

## 2014-11-25 NOTE — Telephone Encounter (Signed)
Pt sent a my chart message, he is not sure which medication he should be taking. I left pt a voice message, Dr. Clent RidgesFry will be returning on 11/29/14 and will review pt's chart.

## 2014-11-25 NOTE — Telephone Encounter (Signed)
I left a voice message for pt, I will need to get clarification on these medication orders before sending in to pharmacy. Explained that Dr. Clent RidgesFry is out of the office until 11/29/14.

## 2014-11-30 ENCOUNTER — Encounter: Payer: Self-pay | Admitting: Family Medicine

## 2014-11-30 MED ORDER — CLONIDINE HCL 0.3 MG PO TABS: 0.3000 mg | ORAL_TABLET | Freq: Two times a day (BID) | ORAL | Status: DC

## 2014-11-30 NOTE — Addendum Note (Signed)
Addended by: Aniceto BossNIMMONS, SYLVIA A on: 11/30/2014 08:52 AM   Modules accepted: Orders

## 2014-11-30 NOTE — Addendum Note (Signed)
Addended by: Aniceto BossNIMMONS, SYLVIA A on: 11/30/2014 08:21 AM   Modules accepted: Orders, Medications

## 2014-11-30 NOTE — Telephone Encounter (Signed)
He should take only Crestor, do not take Lipitor

## 2014-11-30 NOTE — Telephone Encounter (Signed)
I sent pt a mychart message.

## 2014-11-30 NOTE — Telephone Encounter (Signed)
I intended for him to stay on Crestor 40 mg daily. I already sent in a one year supply to his pharmacy on the day of his cpx last month

## 2014-12-06 MED ORDER — ZOLPIDEM TARTRATE 10 MG PO TABS: 10.0000 mg | ORAL_TABLET | Freq: Every evening | ORAL | Status: DC | PRN

## 2014-12-06 NOTE — Telephone Encounter (Signed)
I printed 2 rx, one for #30 that he can pick up and fill, the other for #90 with one rf for you to fax to [facility]

## 2014-12-06 NOTE — Addendum Note (Signed)
Addended by: Gershon CraneFRY, STEPHEN A on: 12/06/2014 06:04 PM   Modules accepted: Orders

## 2014-12-07 NOTE — Telephone Encounter (Signed)
I spoke with pt, a script for Ambien is ready for pick up and I faxed a 90 day supply to mail order.

## 2014-12-08 ENCOUNTER — Encounter: Payer: Self-pay | Admitting: Cardiology

## 2014-12-08 ENCOUNTER — Ambulatory Visit (INDEPENDENT_AMBULATORY_CARE_PROVIDER_SITE_OTHER): Payer: BLUE CROSS/BLUE SHIELD | Admitting: Cardiology

## 2014-12-08 VITALS — BP 130/82 | HR 51 | Ht 73.0 in | Wt 232.1 lb

## 2014-12-08 DIAGNOSIS — I2510 Atherosclerotic heart disease of native coronary artery without angina pectoris: Secondary | ICD-10-CM

## 2014-12-08 DIAGNOSIS — I10 Essential (primary) hypertension: Secondary | ICD-10-CM | POA: Diagnosis not present

## 2014-12-08 NOTE — Patient Instructions (Signed)
Your physician wants you to follow-up in: ONE YEAR WITH DR Shelda PalRENSHAW You will receive a reminder letter in the mail two months in advance. If you don't receive a letter, please call our office to schedule the follow-up appointment.   STOP PLAVIX  Your physician recommends that you return for lab work in: 6 WEEKS=DO NOT EAT PRIOR TO LAB WORK

## 2014-12-08 NOTE — Assessment & Plan Note (Signed)
Recent LDL greater than 200. Crestor resumed at 40 mg daily. In 6 weeks check lipids and liver.

## 2014-12-08 NOTE — Assessment & Plan Note (Signed)
Continue aspirin and statin. Discontinue Plavix. 

## 2014-12-08 NOTE — Progress Notes (Signed)
HPI: FU coronary artery disease, hypertension, diabetes, and hyperlipidemia. He had a drug-eluting stent to his circumflex and a drug-eluting stent to his right coronary artery in March of 2010. His LV function is normal. Abdominal ultrasound in December 2010 showed no aneurysm and no renal artery stenosis. Nuclear study November 2013 showed ejection fraction 61% and normal perfusion. Since I last saw him, he occasionally feels a throbbing in his chest not associated with exertion. This has been present ever since the stents were put in. No exertional chest pain or dyspnea.  Current Outpatient Prescriptions  Medication Sig Dispense Refill  . ALPRAZolam (XANAX) 1 MG tablet Take 1 tablet (1 mg total) by mouth 3 (three) times daily as needed for anxiety. TAKE 1 TABLET BY MOUTH TWICE DAILY AS NEEDED 270 tablet 1  . amLODipine (NORVASC) 10 MG tablet TAKE 1 TABLET (10 MG TOTAL) BY MOUTH DAILY. 90 tablet 3  . Aspirin-Acetaminophen-Caffeine (GOODYS EXTRA STRENGTH) 500-325-65 MG PACK Take 1 Package by mouth daily. 56 each 0  . cloNIDine (CATAPRES) 0.3 MG tablet Take 1 tablet (0.3 mg total) by mouth 2 (two) times daily. 180 tablet 3  . fenofibrate 160 MG tablet Take 1 tablet (160 mg total) by mouth at bedtime. 90 tablet 3  . glipiZIDE (GLUCOTROL) 10 MG tablet TAKE 1 TABLET (10 MG TOTAL) BY MOUTH 2 (TWO) TIMES DAILY BEFORE A MEAL. 180 tablet 3  . HYDROcodone-acetaminophen (NORCO) 5-325 MG per tablet Take 1 tablet by mouth every 6 (six) hours as needed. 120 tablet 0  . indomethacin (INDOCIN SR) 75 MG CR capsule Take 1 capsule (75 mg total) by mouth 2 (two) times daily with a meal. 180 capsule 3  . lisinopril-hydrochlorothiazide (PRINZIDE,ZESTORETIC) 20-12.5 MG per tablet TAKE 1 TABLET BY MOUTH TWICE DAILY AT 10AM AND 5PM 180 tablet 3  . metFORMIN (GLUCOPHAGE) 1000 MG tablet TAKE 1 TABLET BY MOUTH TWICE DAILY WITH A MEAL 180 tablet 3  . metoprolol succinate (TOPROL-XL) 100 MG 24 hr tablet Take 1 tablet  (100 mg total) by mouth 2 (two) times daily. 180 tablet 3  . nitroGLYCERIN (NITROSTAT) 0.4 MG SL tablet Place 1 tablet (0.4 mg total) under the tongue every 5 (five) minutes as needed. For chest pain. 25 tablet 5  . ONE TOUCH ULTRA TEST test strip USE DAILY AS DIRECTED 100 each 1  . rosuvastatin (CRESTOR) 40 MG tablet TAKE 1 TABLET BY MOUTH EVERY DAY 90 tablet 3  . zolpidem (AMBIEN) 10 MG tablet Take 1 tablet (10 mg total) by mouth at bedtime as needed. 90 tablet 1   No current facility-administered medications for this visit.     Past Medical History  Diagnosis Date  . Hyperlipidemia   . Hypertension   . CAD (coronary artery disease)     sees Dr. [name], normal Stress test 07-16-12   . DJD (degenerative joint disease)   . Hypothyroid   . Gout   . Anxiety   . Osteoarthritis   . Diabetes mellitus   . Renal calculus     hx  . Umbilical hernia     Past Surgical History  Procedure Laterality Date  . I & d toe  05/19/2012    rt great toe  . Coronary angioplasty with stent placement    . I&d extremity  05/19/2012    Procedure: IRRIGATION AND DEBRIDEMENT EXTREMITY;  Surgeon: [name], MD;  Location: [facility] OR;  Service: Orthopedics;  Laterality: Right;  Right Great toe  .  Tonsillectomy    . Finger surgery      left index finger  . Amputation  07/17/2012    Procedure: AMPUTATION RAY;  Surgeon: Toni ArthursJohn Hewitt, MD;  [facility];  Service: Orthopedics;  Laterality: Right;  right hallux amputation (1st ray resection )    History   Social History  . Marital Status: Married    Spouse Name: N/A  . Number of Children: N/A  . Years of Education: N/A   Occupational History  . Not on file.   Social History Main Topics  . Smoking status: Never Smoker   . Smokeless tobacco: Current User    Types: Chew     Comment: occ  . Alcohol Use: 0.0 oz/week    0 Standard drinks or equivalent per week     Comment: weekends  . Drug Use: No  . Sexual Activity: Yes   Other Topics Concern   . Not on file   Social History Narrative    ROS: no fevers or chills, productive cough, hemoptysis, dysphasia, odynophagia, melena, hematochezia, dysuria, hematuria, rash, seizure activity, orthopnea, PND, pedal edema, claudication. Remaining systems are negative.  Physical Exam: Well-developed well-nourished in no acute distress.  Skin is warm and dry.  HEENT is normal.  Neck is supple.  Chest is clear to auscultation with normal expansion.  Cardiovascular exam is regular rate and rhythm.  Abdominal exam nontender or distended. No masses palpated. Extremities show no edema. neuro grossly intact  ECG sinus bradycardia at a rate of 51. Left ventricular hypertrophy. No ST changes.

## 2014-12-08 NOTE — Assessment & Plan Note (Signed)
Blood pressure controlled. Continue present medications. 

## 2015-02-06 ENCOUNTER — Telehealth: Payer: Self-pay | Admitting: Family Medicine

## 2015-02-07 ENCOUNTER — Telehealth: Payer: Self-pay | Admitting: Family Medicine

## 2015-02-07 ENCOUNTER — Other Ambulatory Visit: Payer: Self-pay | Admitting: Family Medicine

## 2015-02-07 MED ORDER — HYDROCODONE-ACETAMINOPHEN 5-325 MG PO TABS: 1.0000 | ORAL_TABLET | Freq: Four times a day (QID) | ORAL | Status: DC | PRN

## 2015-02-07 NOTE — Addendum Note (Signed)
Addended by: Gershon Crane A on: 02/07/2015 05:41 PM   Modules accepted: Orders

## 2015-02-07 NOTE — Telephone Encounter (Signed)
Pt walk in and needs a refill on levothyroxine #90 call into [facility] [address] [phone]. Pt has ony 2 pills left

## 2015-02-07 NOTE — Telephone Encounter (Signed)
Refill Levothyroxine for one year

## 2015-02-07 NOTE — Telephone Encounter (Signed)
This is a duplicate note, please see previous one. 

## 2015-02-08 ENCOUNTER — Telehealth: Payer: Self-pay

## 2015-02-08 MED ORDER — LEVOTHYROXINE SODIUM 112 MCG PO TABS: 112.0000 ug | ORAL_TABLET | Freq: Every day | ORAL | Status: DC

## 2015-02-08 NOTE — Addendum Note (Signed)
Addended by: Aniceto Boss A on: 02/08/2015 01:25 PM   Modules accepted: Orders, Medications

## 2015-02-08 NOTE — Telephone Encounter (Signed)
Patient requesting refill of Levothyroxine 0.112 mcg tab. Take 1 by mouth daily.

## 2015-02-08 NOTE — Telephone Encounter (Signed)
I sent script e-scribe for synthroid to [facility] and a my chart message to pt.

## 2015-02-08 NOTE — Telephone Encounter (Signed)
Refill for one year 

## 2015-02-08 NOTE — Telephone Encounter (Signed)
Script for Hydrocodone is ready for pick up, still need to send other script to pharmacy.

## 2015-02-09 NOTE — Telephone Encounter (Signed)
I sent script e-scribe. 

## 2015-03-01 ENCOUNTER — Other Ambulatory Visit: Payer: Self-pay | Admitting: Family Medicine

## 2015-05-16 ENCOUNTER — Other Ambulatory Visit: Payer: Self-pay | Admitting: Family Medicine

## 2015-05-16 NOTE — Telephone Encounter (Signed)
Call in #[phone] with 1 rf  

## 2015-05-18 NOTE — Telephone Encounter (Signed)
CALL IN VERBAL ORDER FOR Robert Huang

## 2015-05-30 ENCOUNTER — Other Ambulatory Visit: Payer: Self-pay | Admitting: Family Medicine

## 2015-05-31 NOTE — Telephone Encounter (Signed)
Call in #[phone] with one rf 

## 2015-07-01 ENCOUNTER — Other Ambulatory Visit: Payer: Self-pay | Admitting: Family Medicine

## 2015-07-02 ENCOUNTER — Telehealth: Payer: Self-pay | Admitting: Family Medicine

## 2015-07-06 MED ORDER — ZOLPIDEM TARTRATE 10 MG PO TABS: 10.0000 mg | ORAL_TABLET | Freq: Every evening | ORAL | Status: DC | PRN

## 2015-07-06 MED ORDER — ASPIRIN-ACETAMINOPHEN-CAFFEINE 500-325-65 MG PO PACK: 1.0000 | PACK | Freq: Every day | ORAL | Status: DC

## 2015-07-06 NOTE — Addendum Note (Signed)
Addended by: Gershon CraneFRY, Reya Aurich A on: 07/06/2015 12:57 PM   Modules accepted: Orders

## 2015-07-06 NOTE — Telephone Encounter (Signed)
Ready to fax to [facility]

## 2015-07-06 NOTE — Telephone Encounter (Signed)
Scripts were printed and faxed to [facility].  

## 2015-08-01 ENCOUNTER — Telehealth: Payer: Self-pay | Admitting: Family Medicine

## 2015-08-04 MED ORDER — HYDROCODONE-ACETAMINOPHEN 5-325 MG PO TABS: 1.0000 | ORAL_TABLET | Freq: Four times a day (QID) | ORAL | Status: DC | PRN

## 2015-08-04 NOTE — Telephone Encounter (Signed)
done

## 2015-08-04 NOTE — Addendum Note (Signed)
Addended by: Gershon CraneFRY, Neola Worrall A on: 08/04/2015 08:37 AM   Modules accepted: Orders

## 2015-08-04 NOTE — Telephone Encounter (Signed)
Script is ready for pick up and I sent pt a my chart message.  

## 2015-08-15 ENCOUNTER — Other Ambulatory Visit: Payer: Self-pay | Admitting: Family Medicine

## 2015-08-15 MED ORDER — GLUCOSE BLOOD VI STRP: 1.0000 | ORAL_STRIP | Freq: Every day | Status: DC

## 2015-08-15 NOTE — Telephone Encounter (Signed)
Not filled since 2014  

## 2015-08-25 ENCOUNTER — Other Ambulatory Visit: Payer: Self-pay | Admitting: Family Medicine

## 2015-10-07 ENCOUNTER — Telehealth: Payer: Self-pay | Admitting: Family Medicine

## 2015-10-07 MED ORDER — HYDROCODONE-ACETAMINOPHEN 5-325 MG PO TABS: 1.0000 | ORAL_TABLET | Freq: Four times a day (QID) | ORAL | Status: DC | PRN

## 2015-10-07 MED ORDER — AMLODIPINE BESYLATE 10 MG PO TABS: ORAL_TABLET | ORAL | Status: DC

## 2015-10-07 NOTE — Addendum Note (Signed)
Addended by: Gershon Crane A on: 10/07/2015 12:54 PM   Modules accepted: Orders

## 2015-10-07 NOTE — Telephone Encounter (Signed)
The Norco rx is ready. I already sent the amlodipine to [facility]

## 2015-10-25 NOTE — Progress Notes (Signed)
HPI: FU coronary artery disease, hypertension, diabetes, and hyperlipidemia. He had a drug-eluting stent to his circumflex and a drug-eluting stent to his right coronary artery in March of 2010. His LV function is normal. Abdominal ultrasound in December 2010 showed no aneurysm and no renal artery stenosis. Nuclear study November 2013 showed ejection fraction 61% and normal perfusion. Since I last saw him, He denies dyspnea, palpitations or syncope. He has had some abdominal pain radiating to his chest. It occurs with stress and is relieved with Maalox. It is unlike his previous infarct pain. He denies exertional chest pain.  Current Outpatient Prescriptions  Medication Sig Dispense Refill  . ALPRAZolam (XANAX) 1 MG tablet TAKE 1 TABLET BY MOUTH 3 TIMES A DAY AS NEEDED FOR ANXIETY 270 tablet 1  . amLODipine (NORVASC) 10 MG tablet TAKE 1 TABLET BY MOUTH  DAILY 90 tablet 3  . Aspirin-Acetaminophen-Caffeine (GOODYS EXTRA STRENGTH) 500-325-65 MG PACK Take 1 Package by mouth daily. 90 each 3  . cloNIDine (CATAPRES) 0.3 MG tablet TAKE 1 TABLET BY MOUTH  TWICE DAILY 180 tablet 3  . fenofibrate 160 MG tablet TAKE 1 TABLET BY MOUTH AT  BEDTIME 90 tablet 3  . glipiZIDE (GLUCOTROL) 10 MG tablet TAKE 1 TABLET (10 MG TOTAL) BY MOUTH 2 (TWO) TIMES DAILY BEFORE A MEAL. 180 tablet 3  . glucose blood (ONE TOUCH ULTRA TEST) test strip 1 each by Other route daily. Use as instructed 100 each 1  . HYDROcodone-acetaminophen (NORCO) 5-325 MG tablet Take 1 tablet by mouth every 6 (six) hours as needed. 120 tablet 0  . indomethacin (INDOCIN SR) 75 MG CR capsule Take 1 capsule (75 mg total) by mouth 2 (two) times daily with a meal. 180 capsule 3  . levothyroxine (SYNTHROID, LEVOTHROID) 112 MCG tablet Take 1 tablet (112 mcg total) by mouth daily. 90 tablet 3  . lisinopril-hydrochlorothiazide (PRINZIDE,ZESTORETIC) 20-12.5 MG tablet TAKE 1 TABLET BY MOUTH  TWICE DAILY AT 10 AM AND 5  PM 180 tablet 3  . metFORMIN  (GLUCOPHAGE) 1000 MG tablet TAKE 1 TABLET BY MOUTH TWICE DAILY WITH A MEAL 180 tablet 3  . metoprolol succinate (TOPROL-XL) 100 MG 24 hr tablet TAKE 1 TABLET BY MOUTH  TWICE A DAY 180 tablet 3  . nitroGLYCERIN (NITROSTAT) 0.4 MG SL tablet Place 1 tablet (0.4 mg total) under the tongue every 5 (five) minutes as needed. For chest pain. 25 tablet 5  . rosuvastatin (CRESTOR) 40 MG tablet TAKE 1 TABLET BY MOUTH EVERY DAY 90 tablet 3  . zolpidem (AMBIEN) 10 MG tablet Take 1 tablet (10 mg total) by mouth at bedtime as needed. 90 tablet 1   No current facility-administered medications for this visit.     Past Medical History  Diagnosis Date  . Hyperlipidemia   . Hypertension   . CAD (coronary artery disease)     sees Dr. [name], normal Stress test 07-16-12   . DJD (degenerative joint disease)   . Hypothyroid   . Gout   . Anxiety   . Osteoarthritis   . Diabetes mellitus   . Renal calculus     hx  . Umbilical hernia     Past Surgical History  Procedure Laterality Date  . I & d toe  05/19/2012    rt great toe  . Coronary angioplasty with stent placement    . I&d extremity  05/19/2012    Procedure: IRRIGATION AND DEBRIDEMENT EXTREMITY;  Surgeon: [name], MD;  Location:  [facility] OR;  Service: Orthopedics;  Laterality: Right;  Right Great toe  . Tonsillectomy    . Finger surgery      left index finger  . Amputation  07/17/2012    Procedure: AMPUTATION RAY;  Surgeon: [name], MD;  Location: [facility] OR;  Service: Orthopedics;  Laterality: Right;  right hallux amputation (1st ray resection )    Social History   Social History  . Marital Status: Married    Spouse Name: N/A  . Number of Children: N/A  . Years of Education: N/A   Occupational History  . Not on file.   Social History Main Topics  . Smoking status: Never Smoker   . Smokeless tobacco: Current User    Types: Chew     Comment: occ  . Alcohol Use: 0.0 oz/week    0 Standard drinks or equivalent per week     Comment:  weekends  . Drug Use: No  . Sexual Activity: Yes   Other Topics Concern  . Not on file   Social History Narrative    Family History  Problem Relation Age of Onset  . Arthritis    . Coronary artery disease    . Diabetes    . Hypertension    . Prostate cancer    . Stroke    . Heart attack Mother     ROS: no fevers or chills, productive cough, hemoptysis, dysphasia, odynophagia, melena, hematochezia, dysuria, hematuria, rash, seizure activity, orthopnea, PND, pedal edema, claudication. Remaining systems are negative.  Physical Exam: Well-developed well-nourished in no acute distress.  Skin is warm and dry.  HEENT is normal.  Neck is supple.  Chest is clear to auscultation with normal expansion.  Cardiovascular exam is regular rate and rhythm.  Abdominal exam nontender or distended. No masses palpated. Extremities show no edema. neuro grossly intact  ECG Sinus rhythm at a rate of 62. Left ventricular hypertrophy. No ST changes.

## 2015-10-26 ENCOUNTER — Encounter: Payer: Self-pay | Admitting: Cardiology

## 2015-10-26 ENCOUNTER — Ambulatory Visit (INDEPENDENT_AMBULATORY_CARE_PROVIDER_SITE_OTHER): Payer: BLUE CROSS/BLUE SHIELD | Admitting: Cardiology

## 2015-10-26 DIAGNOSIS — R079 Chest pain, unspecified: Secondary | ICD-10-CM | POA: Diagnosis not present

## 2015-10-26 DIAGNOSIS — I2510 Atherosclerotic heart disease of native coronary artery without angina pectoris: Secondary | ICD-10-CM

## 2015-10-26 MED ORDER — METOPROLOL SUCCINATE ER 100 MG PO TB24: ORAL_TABLET | ORAL | Status: DC

## 2015-10-26 MED ORDER — FENOFIBRATE 160 MG PO TABS: ORAL_TABLET | ORAL | Status: DC

## 2015-10-26 MED ORDER — LISINOPRIL-HYDROCHLOROTHIAZIDE 20-12.5 MG PO TABS: ORAL_TABLET | ORAL | Status: DC

## 2015-10-26 MED ORDER — CLONIDINE HCL 0.3 MG PO TABS: ORAL_TABLET | ORAL | Status: DC

## 2015-10-26 MED ORDER — AMLODIPINE BESYLATE 10 MG PO TABS: ORAL_TABLET | ORAL | Status: DC

## 2015-10-26 MED ORDER — NITROGLYCERIN 0.4 MG SL SUBL: 0.4000 mg | SUBLINGUAL_TABLET | SUBLINGUAL | Status: DC | PRN

## 2015-10-26 MED ORDER — ROSUVASTATIN CALCIUM 40 MG PO TABS: ORAL_TABLET | ORAL | Status: DC

## 2015-10-26 NOTE — Assessment & Plan Note (Addendum)
Blood pressure is mildly elevated. I have asked him to follow this at home and we will add medications as needed. Check potassium and renal function.

## 2015-10-26 NOTE — Assessment & Plan Note (Signed)
Continue aspirin and statin. 

## 2015-10-26 NOTE — Patient Instructions (Signed)
Medication Instructions:   NO CHANGE  Labwork:  Your physician recommends that you return for lab work WHEN ABLE  Follow-Up:  Your physician wants you to follow-up in: 6 MONTHS WITH DR CRENSHAW You will receive a reminder letter in the mail two months in advance. If you don't receive a letter, please call our office to schedule the follow-up appointment.   If you need a refill on your cardiac medications before your next appointment, please call your pharmacy.    

## 2015-10-26 NOTE — Assessment & Plan Note (Signed)
Continue statin. Check lipids and liver. 

## 2015-10-26 NOTE — Assessment & Plan Note (Signed)
Symptoms radiateFrom his abdomen. They occur with stress and are relieved with Maalox. Likely GI related. We discussed a functional study but he declined at this point. He is scheduled for a GI evaluation and if it is unremarkable he will consider functional study after that.

## 2015-12-06 ENCOUNTER — Other Ambulatory Visit: Payer: Self-pay | Admitting: Family Medicine

## 2015-12-17 ENCOUNTER — Telehealth: Payer: Self-pay | Admitting: Family Medicine

## 2015-12-19 ENCOUNTER — Other Ambulatory Visit: Payer: Self-pay | Admitting: Family Medicine

## 2015-12-20 MED ORDER — HYDROCODONE-ACETAMINOPHEN 5-325 MG PO TABS: 1.0000 | ORAL_TABLET | Freq: Four times a day (QID) | ORAL | Status: DC | PRN

## 2015-12-20 MED ORDER — ZOLPIDEM TARTRATE 10 MG PO TABS: 10.0000 mg | ORAL_TABLET | Freq: Every evening | ORAL | Status: DC | PRN

## 2015-12-20 NOTE — Telephone Encounter (Signed)
Both are ready to pick up  

## 2015-12-21 MED ORDER — INDOMETHACIN ER 75 MG PO CPCR: 75.0000 mg | ORAL_CAPSULE | Freq: Two times a day (BID) | ORAL | Status: DC

## 2015-12-27 ENCOUNTER — Ambulatory Visit (INDEPENDENT_AMBULATORY_CARE_PROVIDER_SITE_OTHER): Payer: BLUE CROSS/BLUE SHIELD | Admitting: Family Medicine

## 2015-12-27 ENCOUNTER — Encounter: Payer: Self-pay | Admitting: Family Medicine

## 2015-12-27 VITALS — BP 148/80 | HR 63 | Temp 98.2°F | Ht 73.0 in | Wt 236.0 lb

## 2015-12-27 DIAGNOSIS — Z0000 Encounter for general adult medical examination without abnormal findings: Secondary | ICD-10-CM

## 2015-12-27 DIAGNOSIS — R7989 Other specified abnormal findings of blood chemistry: Secondary | ICD-10-CM | POA: Diagnosis not present

## 2015-12-27 DIAGNOSIS — Z0001 Encounter for general adult medical examination with abnormal findings: Secondary | ICD-10-CM | POA: Diagnosis not present

## 2015-12-27 DIAGNOSIS — M25551 Pain in right hip: Secondary | ICD-10-CM | POA: Diagnosis not present

## 2015-12-27 LAB — HEPATIC FUNCTION PANEL
ALT: 23 U/L (ref 0–53)
AST: 20 U/L (ref 0–37)
Albumin: 4.3 g/dL (ref 3.5–5.2)
Alkaline Phosphatase: 43 U/L (ref 39–117)
BILIRUBIN DIRECT: 0.1 mg/dL (ref 0.0–0.3)
BILIRUBIN TOTAL: 0.5 mg/dL (ref 0.2–1.2)
Total Protein: 7.2 g/dL (ref 6.0–8.3)

## 2015-12-27 LAB — CBC WITH DIFFERENTIAL/PLATELET
BASOS ABS: 0.1 10*3/uL (ref 0.0–0.1)
BASOS PCT: 0.9 % (ref 0.0–3.0)
EOS ABS: 0.3 10*3/uL (ref 0.0–0.7)
Eosinophils Relative: 4.5 % (ref 0.0–5.0)
HEMATOCRIT: 37.4 % — AB (ref 39.0–52.0)
Hemoglobin: 12.5 g/dL — ABNORMAL LOW (ref 13.0–17.0)
LYMPHS ABS: 0.7 10*3/uL (ref 0.7–4.0)
Lymphocytes Relative: 11.7 % — ABNORMAL LOW (ref 12.0–46.0)
MCHC: 33.4 g/dL (ref 30.0–36.0)
MCV: 92.9 fl (ref 78.0–100.0)
MONO ABS: 0.7 10*3/uL (ref 0.1–1.0)
Monocytes Relative: 12.5 % — ABNORMAL HIGH (ref 3.0–12.0)
NEUTROS ABS: 4.2 10*3/uL (ref 1.4–7.7)
NEUTROS PCT: 70.4 % (ref 43.0–77.0)
PLATELETS: 225 10*3/uL (ref 150.0–400.0)
RBC: 4.03 Mil/uL — ABNORMAL LOW (ref 4.22–5.81)
RDW: 14.4 % (ref 11.5–15.5)
WBC: 6 10*3/uL (ref 4.0–10.5)

## 2015-12-27 LAB — POC URINALSYSI DIPSTICK (AUTOMATED)
BILIRUBIN UA: NEGATIVE
Glucose, UA: NEGATIVE
Ketones, UA: NEGATIVE
LEUKOCYTES UA: NEGATIVE
NITRITE UA: NEGATIVE
PH UA: 6
Protein, UA: NEGATIVE
RBC UA: NEGATIVE
Spec Grav, UA: 1.02
UROBILINOGEN UA: 0.2

## 2015-12-27 LAB — BASIC METABOLIC PANEL
BUN: 19 mg/dL (ref 6–23)
CALCIUM: 9.2 mg/dL (ref 8.4–10.5)
CHLORIDE: 104 meq/L (ref 96–112)
CO2: 28 meq/L (ref 19–32)
CREATININE: 0.98 mg/dL (ref 0.40–1.50)
GFR: 84.08 mL/min (ref >60.00)
GLUCOSE: 131 mg/dL — AB (ref 70–99)
Potassium: 4 mEq/L (ref 3.5–5.1)
Sodium: 142 mEq/L (ref 135–145)

## 2015-12-27 LAB — LDL CHOLESTEROL, DIRECT: Direct LDL: 143 mg/dL

## 2015-12-27 LAB — HEMOGLOBIN A1C: Hgb A1c MFr Bld: 6.5 % (ref 4.6–6.5)

## 2015-12-27 LAB — LIPID PANEL
CHOLESTEROL: 235 mg/dL — AB (ref 0–200)
HDL: 46.6 mg/dL (ref >39.00)
NONHDL: 187.93
TRIGLYCERIDES: 351 mg/dL — AB (ref 0.0–149.0)
Total CHOL/HDL Ratio: 5
VLDL: 70.2 mg/dL — ABNORMAL HIGH (ref 0.0–40.0)

## 2015-12-27 LAB — TSH: TSH: 2.16 u[IU]/mL (ref 0.35–4.50)

## 2015-12-27 LAB — PSA: PSA: 1.2 ng/mL (ref 0.10–4.00)

## 2015-12-27 MED ORDER — TRAMADOL HCL 50 MG PO TABS: 100.0000 mg | ORAL_TABLET | Freq: Every evening | ORAL | Status: DC | PRN

## 2015-12-27 NOTE — Progress Notes (Signed)
   Subjective:    Patient ID: Robert Huang, male    DOB: 19-Jan-1961, 55 y.o.   MRN: [id number]  HPI 55 yr old male for a well exam. His only complaint today is right hip pain which started one month ago. No recent trauma but he notes that he played multiple sports all his life, even up in his 2230's. The pain is a deep seated aching pain that bothers him more at night than during the day. Ibuprofen helps during the day but not at night.    Review of Systems  Constitutional: Negative.   HENT: Negative.   Eyes: Negative.   Respiratory: Negative.   Cardiovascular: Negative.   Gastrointestinal: Negative.   Genitourinary: Negative.   Musculoskeletal: Positive for arthralgias. Negative for myalgias, back pain, joint swelling, gait problem, neck pain and neck stiffness.  Skin: Negative.   Neurological: Negative.   Psychiatric/Behavioral: Negative.        Objective:   Physical Exam  Constitutional: He is oriented to person, place, and time. He appears well-developed and well-nourished. No distress.  HENT:  Head: Normocephalic and atraumatic.  Right Ear: External ear normal.  Left Ear: External ear normal.  Nose: Nose normal.  Mouth/Throat: Oropharynx is clear and moist. No oropharyngeal exudate.  Eyes: Conjunctivae and EOM are normal. Pupils are equal, round, and reactive to light. Right eye exhibits no discharge. Left eye exhibits no discharge. No scleral icterus.  Neck: Neck supple. No JVD present. No tracheal deviation present. No thyromegaly present.  Cardiovascular: Normal rate, regular rhythm, normal heart sounds and intact distal pulses.  Exam reveals no gallop and no friction rub.   No murmur heard. Pulmonary/Chest: Effort normal and breath sounds normal. No respiratory distress. He has no wheezes. He has no rales. He exhibits no tenderness.  Abdominal: Soft. Bowel sounds are normal. He exhibits no distension and no mass. There is no tenderness. There is no rebound and no  guarding.  Genitourinary: Rectum normal, prostate normal and penis normal. Guaiac negative stool. No penile tenderness.  Musculoskeletal: Normal range of motion. He exhibits no edema or tenderness.  Lymphadenopathy:    He has no cervical adenopathy.  Neurological: He is alert and oriented to person, place, and time. He has normal reflexes. No cranial nerve deficit. He exhibits normal muscle tone. Coordination normal.  Skin: Skin is warm and dry. No rash noted. He is not diaphoretic. No erythema. No pallor.  Psychiatric: He has a normal mood and affect. His behavior is normal. Judgment and thought content normal.          Assessment & Plan:  Well exam. We discussed diet and exercise. Refer for a colonoscopy. Refer to Orthopedics for the hip pain.  Nelwyn SalisburyFRY,Emon Miggins A, MD

## 2015-12-27 NOTE — Progress Notes (Signed)
Pre visit review using our clinic review tool, if applicable. No additional management support is needed unless otherwise documented below in the visit note. 

## 2016-01-18 ENCOUNTER — Other Ambulatory Visit: Payer: Self-pay | Admitting: Family Medicine

## 2016-01-19 MED ORDER — ZOLPIDEM TARTRATE 10 MG PO TABS: 10.0000 mg | ORAL_TABLET | Freq: Every evening | ORAL | Status: DC | PRN

## 2016-01-19 NOTE — Telephone Encounter (Signed)
The Ambien rx is ready to pick up

## 2016-01-19 NOTE — Telephone Encounter (Signed)
Script is ready for pick up, sent pt a my chart message and called local pharmacy to cancel any remaining refills on Ambien.

## 2016-01-19 NOTE — Telephone Encounter (Signed)
Can we reprint script for Ambien?

## 2016-01-26 DIAGNOSIS — L237 Allergic contact dermatitis due to plants, except food: Secondary | ICD-10-CM | POA: Diagnosis not present

## 2016-02-02 ENCOUNTER — Other Ambulatory Visit: Payer: Self-pay | Admitting: Family Medicine

## 2016-02-03 NOTE — Telephone Encounter (Signed)
Call in #[phone] with one rf 

## 2016-03-08 ENCOUNTER — Telehealth: Payer: Self-pay | Admitting: Family Medicine

## 2016-03-08 ENCOUNTER — Other Ambulatory Visit: Payer: Self-pay | Admitting: Family Medicine

## 2016-03-09 MED ORDER — LEVOTHYROXINE SODIUM 112 MCG PO TABS: 112.0000 ug | ORAL_TABLET | Freq: Every day | ORAL | Status: DC

## 2016-03-09 NOTE — Telephone Encounter (Signed)
I sent script e-scribe for Synthroid to [facility].

## 2016-03-09 NOTE — Telephone Encounter (Signed)
Refill request for Norco. 

## 2016-03-09 NOTE — Addendum Note (Signed)
Addended by: Aniceto BossNIMMONS, Zofia Peckinpaugh A on: 03/09/2016 04:41 PM   Modules accepted: Orders

## 2016-03-10 ENCOUNTER — Other Ambulatory Visit: Payer: Self-pay | Admitting: Family Medicine

## 2016-03-12 ENCOUNTER — Other Ambulatory Visit: Payer: Self-pay | Admitting: Family Medicine

## 2016-03-12 NOTE — Telephone Encounter (Signed)
I sent pt a my chart message with below information.  

## 2016-03-12 NOTE — Telephone Encounter (Signed)
NO refills on this, I gave him a supply only to last until he saw the Orthopedist

## 2016-03-16 NOTE — Telephone Encounter (Signed)
I sent pt a my chart message, script for Synthroid was sent to [facility].

## 2016-04-28 ENCOUNTER — Other Ambulatory Visit: Payer: Self-pay | Admitting: Family Medicine

## 2016-05-09 DIAGNOSIS — J209 Acute bronchitis, unspecified: Secondary | ICD-10-CM | POA: Diagnosis not present

## 2016-05-09 DIAGNOSIS — H01133 Eczematous dermatitis of right eye, unspecified eyelid: Secondary | ICD-10-CM | POA: Diagnosis not present

## 2016-05-31 ENCOUNTER — Other Ambulatory Visit: Payer: Self-pay | Admitting: Family Medicine

## 2016-05-31 MED ORDER — METFORMIN HCL 1000 MG PO TABS: ORAL_TABLET | ORAL | 11 refills | Status: DC

## 2016-05-31 NOTE — Telephone Encounter (Signed)
Pt need new Rx for metformin   [facility]

## 2016-05-31 NOTE — Telephone Encounter (Signed)
[facility]

## 2016-06-18 ENCOUNTER — Other Ambulatory Visit: Payer: Self-pay | Admitting: Family Medicine

## 2016-08-02 ENCOUNTER — Other Ambulatory Visit: Payer: Self-pay | Admitting: Family Medicine

## 2016-08-06 NOTE — Telephone Encounter (Signed)
Call in Xanax #[phone] with one rf, also Zolpidem #90 with one rf

## 2016-08-08 ENCOUNTER — Other Ambulatory Visit: Payer: Self-pay | Admitting: Family Medicine

## 2016-08-21 DIAGNOSIS — L97511 Non-pressure chronic ulcer of other part of right foot limited to breakdown of skin: Secondary | ICD-10-CM | POA: Diagnosis not present

## 2016-09-13 ENCOUNTER — Other Ambulatory Visit: Payer: Self-pay | Admitting: Family Medicine

## 2016-09-17 MED ORDER — HYDROCODONE-ACETAMINOPHEN 5-325 MG PO TABS: 1.0000 | ORAL_TABLET | Freq: Four times a day (QID) | ORAL | 0 refills | Status: DC | PRN

## 2016-09-17 MED ORDER — INDOMETHACIN ER 75 MG PO CPCR: 75.0000 mg | ORAL_CAPSULE | Freq: Two times a day (BID) | ORAL | 3 refills | Status: DC

## 2016-09-17 NOTE — Telephone Encounter (Signed)
done

## 2016-10-23 ENCOUNTER — Other Ambulatory Visit: Payer: Self-pay | Admitting: Cardiology

## 2016-10-24 DIAGNOSIS — Z23 Encounter for immunization: Secondary | ICD-10-CM | POA: Diagnosis not present

## 2016-10-24 DIAGNOSIS — Z111 Encounter for screening for respiratory tuberculosis: Secondary | ICD-10-CM | POA: Diagnosis not present

## 2016-12-25 DIAGNOSIS — L97521 Non-pressure chronic ulcer of other part of left foot limited to breakdown of skin: Secondary | ICD-10-CM | POA: Diagnosis not present

## 2016-12-25 DIAGNOSIS — E11621 Type 2 diabetes mellitus with foot ulcer: Secondary | ICD-10-CM | POA: Diagnosis not present

## 2017-01-04 DIAGNOSIS — M2041 Other hammer toe(s) (acquired), right foot: Secondary | ICD-10-CM | POA: Diagnosis not present

## 2017-01-04 DIAGNOSIS — L97509 Non-pressure chronic ulcer of other part of unspecified foot with unspecified severity: Secondary | ICD-10-CM | POA: Diagnosis not present

## 2017-01-04 DIAGNOSIS — Z8781 Personal history of (healed) traumatic fracture: Secondary | ICD-10-CM | POA: Diagnosis not present

## 2017-01-04 DIAGNOSIS — E11621 Type 2 diabetes mellitus with foot ulcer: Secondary | ICD-10-CM | POA: Diagnosis not present

## 2017-01-04 DIAGNOSIS — Z89422 Acquired absence of other left toe(s): Secondary | ICD-10-CM | POA: Diagnosis not present

## 2017-01-11 ENCOUNTER — Ambulatory Visit (INDEPENDENT_AMBULATORY_CARE_PROVIDER_SITE_OTHER): Payer: BLUE CROSS/BLUE SHIELD | Admitting: Family Medicine

## 2017-01-11 ENCOUNTER — Encounter: Payer: Self-pay | Admitting: Family Medicine

## 2017-01-11 VITALS — BP 138/85 | HR 59 | Temp 98.3°F | Ht 73.0 in | Wt 235.0 lb

## 2017-01-11 DIAGNOSIS — Z0000 Encounter for general adult medical examination without abnormal findings: Secondary | ICD-10-CM | POA: Diagnosis not present

## 2017-01-11 DIAGNOSIS — E119 Type 2 diabetes mellitus without complications: Secondary | ICD-10-CM | POA: Diagnosis not present

## 2017-01-11 DIAGNOSIS — R011 Cardiac murmur, unspecified: Secondary | ICD-10-CM | POA: Diagnosis not present

## 2017-01-11 DIAGNOSIS — M1000 Idiopathic gout, unspecified site: Secondary | ICD-10-CM | POA: Diagnosis not present

## 2017-01-11 LAB — CBC WITH DIFFERENTIAL/PLATELET
BASOS ABS: 0.1 10*3/uL (ref 0.0–0.1)
Basophils Relative: 0.8 % (ref 0.0–3.0)
EOS ABS: 0.2 10*3/uL (ref 0.0–0.7)
Eosinophils Relative: 3.2 % (ref 0.0–5.0)
HCT: 36.9 % — ABNORMAL LOW (ref 39.0–52.0)
HEMOGLOBIN: 12.1 g/dL — AB (ref 13.0–17.0)
Lymphocytes Relative: 8 % — ABNORMAL LOW (ref 12.0–46.0)
Lymphs Abs: 0.6 10*3/uL — ABNORMAL LOW (ref 0.7–4.0)
MCHC: 32.8 g/dL (ref 30.0–36.0)
MCV: 93.7 fl (ref 78.0–100.0)
MONOS PCT: 11.5 % (ref 3.0–12.0)
Monocytes Absolute: 0.8 10*3/uL (ref 0.1–1.0)
NEUTROS ABS: 5.3 10*3/uL (ref 1.4–7.7)
Neutrophils Relative %: 76.5 % (ref 43.0–77.0)
PLATELETS: 252 10*3/uL (ref 150.0–400.0)
RBC: 3.93 Mil/uL — AB (ref 4.22–5.81)
RDW: 13.7 % (ref 11.5–15.5)
WBC: 6.9 10*3/uL (ref 4.0–10.5)

## 2017-01-11 LAB — LIPID PANEL
Cholesterol: 202 mg/dL — ABNORMAL HIGH (ref 0–200)
HDL: 40.4 mg/dL (ref >39.00)
NONHDL: 161.6
Total CHOL/HDL Ratio: 5
Triglycerides: 323 mg/dL — ABNORMAL HIGH (ref 0.0–149.0)
VLDL: 64.6 mg/dL — ABNORMAL HIGH (ref 0.0–40.0)

## 2017-01-11 LAB — POC URINALSYSI DIPSTICK (AUTOMATED)
BILIRUBIN UA: NEGATIVE
Glucose, UA: NEGATIVE
Ketones, UA: NEGATIVE
LEUKOCYTES UA: NEGATIVE
NITRITE UA: NEGATIVE
PH UA: 6 (ref 5.0–8.0)
Protein, UA: NEGATIVE
RBC UA: NEGATIVE
Spec Grav, UA: >=1.03 — AB (ref 1.010–1.025)
UROBILINOGEN UA: 0.2 U/dL

## 2017-01-11 LAB — BASIC METABOLIC PANEL
BUN: 15 mg/dL (ref 6–23)
CHLORIDE: 104 meq/L (ref 96–112)
CO2: 29 meq/L (ref 19–32)
Calcium: 9 mg/dL (ref 8.4–10.5)
Creatinine, Ser: 1.02 mg/dL (ref 0.40–1.50)
GFR: 79.99 mL/min (ref >60.00)
GLUCOSE: 154 mg/dL — AB (ref 70–99)
POTASSIUM: 4.2 meq/L (ref 3.5–5.1)
SODIUM: 141 meq/L (ref 135–145)

## 2017-01-11 LAB — HEPATIC FUNCTION PANEL
ALK PHOS: 55 U/L (ref 39–117)
ALT: 14 U/L (ref 0–53)
AST: 14 U/L (ref 0–37)
Albumin: 4.1 g/dL (ref 3.5–5.2)
BILIRUBIN DIRECT: 0.1 mg/dL (ref 0.0–0.3)
BILIRUBIN TOTAL: 0.3 mg/dL (ref 0.2–1.2)
TOTAL PROTEIN: 7.1 g/dL (ref 6.0–8.3)

## 2017-01-11 LAB — URIC ACID: URIC ACID, SERUM: 7.6 mg/dL (ref 4.0–7.8)

## 2017-01-11 LAB — PSA: PSA: 1.11 ng/mL (ref 0.10–4.00)

## 2017-01-11 LAB — TSH: TSH: 4.3 u[IU]/mL (ref 0.35–4.50)

## 2017-01-11 LAB — HEMOGLOBIN A1C: Hgb A1c MFr Bld: 7.3 % — ABNORMAL HIGH (ref 4.6–6.5)

## 2017-01-11 LAB — LDL CHOLESTEROL, DIRECT: LDL DIRECT: 115 mg/dL

## 2017-01-11 MED ORDER — HYDROCODONE-ACETAMINOPHEN 5-325 MG PO TABS: 1.0000 | ORAL_TABLET | Freq: Four times a day (QID) | ORAL | 0 refills | Status: DC | PRN

## 2017-01-11 MED ORDER — METHYLPREDNISOLONE 4 MG PO TBPK: ORAL_TABLET | ORAL | 0 refills | Status: DC

## 2017-01-11 NOTE — Patient Instructions (Signed)
WE NOW OFFER   [facility] FAST TRACK!!!  SAME DAY Appointments for ACUTE CARE  Such as: Sprains, Injuries, cuts, abrasions, rashes, muscle pain, joint pain, back pain Colds, flu, sore throats, headache, allergies, cough, fever  Ear pain, sinus and eye infections Abdominal pain, nausea, vomiting, diarrhea, upset stomach Animal/insect bites  3 Easy Ways to Schedule: Walk-In Scheduling Call in scheduling Mychart Sign-up: [email or web address]         

## 2017-01-11 NOTE — Progress Notes (Signed)
Subjective:    Patient ID: Robert Huang, male    DOB: Jan 05, 1961, 56 y.o.   MRN: [id number]  HPI 56 yr old male for a well exam. He has a few things to discuss. He has been seeing Dr. [name] for foot problems. He has had the right great toe amputated and he has a hammer toe on the right 2nd toe. He also has a small ulcer on the tip of the left great toe. Dr. Victorino Dike has recommended he get some diabetic shoes, and he needs me to fill out a form for this. He also asks me to check some swollen painful joints on the left hand that appeared a few days ago. No recent trauma.    Review of Systems  Constitutional: Negative.   HENT: Negative.   Eyes: Negative.   Respiratory: Negative.   Cardiovascular: Negative.   Gastrointestinal: Negative.   Genitourinary: Negative.   Musculoskeletal: Positive for arthralgias and joint swelling.  Skin: Negative.   Neurological: Negative.   Psychiatric/Behavioral: Negative.        Objective:   Physical Exam  Constitutional: He is oriented to person, place, and time. He appears well-developed and well-nourished. No distress.  HENT:  Head: Normocephalic and atraumatic.  Right Ear: External ear normal.  Left Ear: External ear normal.  Nose: Nose normal.  Mouth/Throat: Oropharynx is clear and moist. No oropharyngeal exudate.  Eyes: Conjunctivae and EOM are normal. Pupils are equal, round, and reactive to light. Right eye exhibits no discharge. Left eye exhibits no discharge. No scleral icterus.  Neck: Neck supple. No JVD present. No tracheal deviation present. No thyromegaly present.  Cardiovascular: Normal rate, regular rhythm and intact distal pulses.  Exam reveals no gallop and no friction rub.   There is 2/6 SM at the left sternal margin   Pulmonary/Chest: Effort normal and breath sounds normal. No respiratory distress. He has no wheezes. He has no rales. He exhibits no tenderness.  Abdominal: Soft. Bowel sounds are normal. He exhibits no  distension and no mass. There is no tenderness. There is no rebound and no guarding.  Genitourinary: Rectum normal, prostate normal and penis normal. Rectal exam shows guaiac negative stool. No penile tenderness.  Musculoskeletal: Normal range of motion. He exhibits no edema or tenderness.  The 1st and 2nd MCP joints on the left hand are red, swollen, warm, and tender . The right foot is missing the great toe. The right 2nd toe is a hammertoe with no lesions seen. The left foot shows a shallow clean ulcerated area on the tip of the great toe. Both feet are warm and pink. PT and DP pulses are full on both sides.   Lymphadenopathy:    He has no cervical adenopathy.  Neurological: He is alert and oriented to person, place, and time. He has normal reflexes. No cranial nerve deficit. He exhibits normal muscle tone. Coordination normal.  Skin: Skin is warm and dry. No rash noted. He is not diaphoretic. No erythema. No pallor.  Psychiatric: He has a normal mood and affect. His behavior is normal. Judgment and thought content normal.          Assessment & Plan:  Well exam. We dicscussed diet and exercise. Get fasting labs today. He has an acute gout flare in the hand. Use Norco for pain. He has an early ulcer on the left great toe. He is applying Neosporin to this bid. We filled out forms for him to obtain diabetic shoes. We will  assess the murmur with an ECHO. Gershon CraneStephen Fry, MD

## 2017-01-26 ENCOUNTER — Other Ambulatory Visit: Payer: Self-pay | Admitting: Cardiology

## 2017-02-13 ENCOUNTER — Other Ambulatory Visit: Payer: Self-pay | Admitting: Family Medicine

## 2017-02-14 NOTE — Telephone Encounter (Signed)
Call in #[phone] with one rf 

## 2017-02-18 ENCOUNTER — Telehealth (HOSPITAL_COMMUNITY): Payer: Self-pay | Admitting: Family Medicine

## 2017-02-18 NOTE — Telephone Encounter (Addendum)
User: Trina AoGriffin, Suriyah Vergara A Date/time: 02/11/2017 3:28 PM  Comment: Called pt and lmsg for him to CB to get scheduled for echo.   Context: [facility] Schedule Orders/Appt Requests Outcome: Left Message  Phone number: [phone] Phone Type: Home Phone  Comm. type: Telephone Call type: Outgoing  Contact: [name], Robert T Relation to patient: Self  Letter:      User: Trina AoGriffin, Phoebie Shad A Date/time: 02/08/2017 9:20 AM  Comment: Called pt and lmsg for him to CB to get scheduled for echo.   Context: [facility] Schedule Orders/Appt Requests Outcome: Left Message  Phone number: [phone] Phone Type: Home Phone  Comm. type: Telephone Call type: Outgoing  Contact: [name], Robert T Relation to patient: Self  Letter:             01-17-17 Lvmom to call and schedule echo/saf  02-14-17 lmsg to call and schedule echo/dc

## 2017-03-02 ENCOUNTER — Other Ambulatory Visit: Payer: Self-pay | Admitting: Family Medicine

## 2017-03-05 ENCOUNTER — Other Ambulatory Visit: Payer: Self-pay | Admitting: Family Medicine

## 2017-03-13 ENCOUNTER — Other Ambulatory Visit: Payer: Self-pay | Admitting: Family Medicine

## 2017-03-13 NOTE — Telephone Encounter (Signed)
Call in #[phone] with one rf 

## 2017-04-07 ENCOUNTER — Other Ambulatory Visit: Payer: Self-pay | Admitting: Family Medicine

## 2017-05-16 ENCOUNTER — Encounter: Payer: Self-pay | Admitting: Family Medicine

## 2017-06-02 ENCOUNTER — Other Ambulatory Visit: Payer: Self-pay | Admitting: Family Medicine

## 2017-06-03 MED ORDER — HYDROCODONE-ACETAMINOPHEN 5-325 MG PO TABS: 1.0000 | ORAL_TABLET | Freq: Four times a day (QID) | ORAL | 0 refills | Status: DC | PRN

## 2017-06-03 NOTE — Telephone Encounter (Signed)
Done

## 2017-06-04 NOTE — Telephone Encounter (Signed)
Sent pt a my chart message, also letter was given.

## 2017-07-09 ENCOUNTER — Emergency Department (HOSPITAL_COMMUNITY): Payer: BLUE CROSS/BLUE SHIELD

## 2017-07-09 ENCOUNTER — Other Ambulatory Visit: Payer: Self-pay

## 2017-07-09 ENCOUNTER — Emergency Department (HOSPITAL_COMMUNITY)
Admission: EM | Admit: 2017-07-09 | Discharge: 2017-07-10 | Disposition: A | Discharge status: Home / Self Care | Payer: BLUE CROSS/BLUE SHIELD | Attending: Emergency Medicine | Admitting: Emergency Medicine

## 2017-07-09 ENCOUNTER — Encounter (HOSPITAL_COMMUNITY): Payer: Self-pay | Admitting: Emergency Medicine

## 2017-07-09 DIAGNOSIS — L03116 Cellulitis of left lower limb: Secondary | ICD-10-CM | POA: Diagnosis not present

## 2017-07-09 DIAGNOSIS — R05 Cough: Secondary | ICD-10-CM | POA: Diagnosis not present

## 2017-07-09 DIAGNOSIS — F17220 Nicotine dependence, chewing tobacco, uncomplicated: Secondary | ICD-10-CM | POA: Diagnosis not present

## 2017-07-09 DIAGNOSIS — I2510 Atherosclerotic heart disease of native coronary artery without angina pectoris: Secondary | ICD-10-CM | POA: Diagnosis not present

## 2017-07-09 DIAGNOSIS — E119 Type 2 diabetes mellitus without complications: Secondary | ICD-10-CM | POA: Diagnosis not present

## 2017-07-09 DIAGNOSIS — E039 Hypothyroidism, unspecified: Secondary | ICD-10-CM | POA: Diagnosis not present

## 2017-07-09 DIAGNOSIS — I10 Essential (primary) hypertension: Secondary | ICD-10-CM | POA: Diagnosis not present

## 2017-07-09 DIAGNOSIS — L03032 Cellulitis of left toe: Secondary | ICD-10-CM | POA: Diagnosis not present

## 2017-07-09 DIAGNOSIS — M79672 Pain in left foot: Secondary | ICD-10-CM | POA: Diagnosis not present

## 2017-07-09 DIAGNOSIS — Z7984 Long term (current) use of oral hypoglycemic drugs: Secondary | ICD-10-CM | POA: Insufficient documentation

## 2017-07-09 DIAGNOSIS — M7989 Other specified soft tissue disorders: Secondary | ICD-10-CM | POA: Diagnosis not present

## 2017-07-09 LAB — CBC WITH DIFFERENTIAL/PLATELET
Basophils Absolute: 0 10*3/uL (ref 0.0–0.1)
Basophils Relative: 0 %
EOS ABS: 0 10*3/uL (ref 0.0–0.7)
EOS PCT: 0 %
HCT: 38.7 % — ABNORMAL LOW (ref 39.0–52.0)
Hemoglobin: 12.6 g/dL — ABNORMAL LOW (ref 13.0–17.0)
LYMPHS ABS: 0.5 10*3/uL — AB (ref 0.7–4.0)
LYMPHS PCT: 8 %
MCH: 31.7 pg (ref 26.0–34.0)
MCHC: 32.6 g/dL (ref 30.0–36.0)
MCV: 97.2 fL (ref 78.0–100.0)
MONO ABS: 0.4 10*3/uL (ref 0.1–1.0)
MONOS PCT: 6 %
Neutro Abs: 5.6 10*3/uL (ref 1.7–7.7)
Neutrophils Relative %: 86 %
PLATELETS: 225 10*3/uL (ref 150–400)
RBC: 3.98 MIL/uL — ABNORMAL LOW (ref 4.22–5.81)
RDW: 13.2 % (ref 11.5–15.5)
WBC: 6.6 10*3/uL (ref 4.0–10.5)

## 2017-07-09 LAB — I-STAT CG4 LACTIC ACID, ED: LACTIC ACID, VENOUS: 1.65 mmol/L (ref 0.5–1.9)

## 2017-07-09 LAB — COMPREHENSIVE METABOLIC PANEL
ALK PHOS: 54 U/L (ref 38–126)
ALT: 25 U/L (ref 17–63)
AST: 29 U/L (ref 15–41)
Albumin: 3.6 g/dL (ref 3.5–5.0)
Anion gap: 10 (ref 5–15)
BILIRUBIN TOTAL: 0.6 mg/dL (ref 0.3–1.2)
BUN: 15 mg/dL (ref 6–20)
CHLORIDE: 102 mmol/L (ref 101–111)
CO2: 24 mmol/L (ref 22–32)
CREATININE: 1.34 mg/dL — AB (ref 0.61–1.24)
Calcium: 9 mg/dL (ref 8.9–10.3)
GFR, EST NON AFRICAN AMERICAN: 57 mL/min — AB (ref >60)
GLUCOSE: 157 mg/dL — AB (ref 65–99)
POTASSIUM: 3.5 mmol/L (ref 3.5–5.1)
Sodium: 136 mmol/L (ref 135–145)
Total Protein: 7.2 g/dL (ref 6.5–8.1)

## 2017-07-09 LAB — URINALYSIS, ROUTINE W REFLEX MICROSCOPIC
Bacteria, UA: NONE SEEN
Bilirubin Urine: NEGATIVE
Glucose, UA: NEGATIVE mg/dL
KETONES UR: 5 mg/dL — AB
Leukocytes, UA: NEGATIVE
Nitrite: NEGATIVE
PH: 5 (ref 5.0–8.0)
Protein, ur: 30 mg/dL — AB
SQUAMOUS EPITHELIAL / LPF: NONE SEEN
Specific Gravity, Urine: 1.02 (ref 1.005–1.030)

## 2017-07-09 MED ORDER — ACETAMINOPHEN 325 MG PO TABS
650.0000 mg | ORAL_TABLET | Freq: Once | ORAL | Status: AC
  Administered 2017-07-09: 650 mg via ORAL
  Filled 2017-07-09: qty 2

## 2017-07-09 MED ORDER — DOXYCYCLINE HYCLATE 100 MG IV SOLR
100.0000 mg | Freq: Once | INTRAVENOUS | Status: AC
  Administered 2017-07-09: 100 mg via INTRAVENOUS
  Filled 2017-07-09: qty 100

## 2017-07-09 MED ORDER — DOXYCYCLINE HYCLATE 100 MG PO CAPS: 100.0000 mg | ORAL_CAPSULE | Freq: Two times a day (BID) | ORAL | 0 refills | Status: DC

## 2017-07-09 MED ORDER — SODIUM CHLORIDE 0.9 % IV BOLUS (SEPSIS)
1000.0000 mL | Freq: Once | INTRAVENOUS | Status: AC
  Administered 2017-07-09: 1000 mL via INTRAVENOUS

## 2017-07-09 NOTE — ED Triage Notes (Signed)
Patient presents to ED from home with "infected left great toe" x 3-4 days. Patient has been applying Neosporin and covering bandage. Patient had R great toe removed 2013.

## 2017-07-09 NOTE — ED Provider Notes (Signed)
[facility]   CSN: [id number] Arrival date & time: 07/09/17  1201     History   Chief Complaint Chief Complaint  Patient presents with  . Cellulitis  . Toe Pain    HPI Owens Sharklan T Grow is a 56 y.o. male who presents with an infected left great toe. PMH significant for CAD, HLD, HTN, hx of osteomyelitis, Type 2 DM complicated by neuropathy. He states that he has had an ulcer on the tip of his toe for several months. Over the past month he has had a gradually worsening infection of the great toe. He denies pain stating he cannot feel the tips of his toes. The redness has progressively been worsening. Last night he developed a fever and chills. He went to UC today who recommended him to come to the ED for IV antibiotics. He also reports a productive cough. He denies chest pain, SOB, abdominal pain, N/V/D. He also reports an episode of decreased urine today but denies dysuria.  HPI  Past Medical History:  Diagnosis Date  . Anxiety   . CAD (coronary artery disease)    sees Dr. Jens Somrenshaw, normal Stress test 07-16-12   . Diabetes mellitus   . DJD (degenerative joint disease)   . Gout   . Hyperlipidemia   . Hypertension   . Hypothyroid   . Osteoarthritis   . Renal calculus    hx  . Umbilical hernia     Patient Active Problem List   Diagnosis Date Noted  . Right hip pain 12/27/2015  . MRSA (methicillin resistant staph aureus) culture positive 12/31/2012  . Open displaced fracture of right great toe 05/20/2012  . ANXIETY 12/25/2008  . Coronary atherosclerosis 12/25/2008  . CHEST PAIN 12/25/2008  . HYPOTHYROIDISM 05/27/2007  . Diabetes mellitus without complication (HCC) 05/27/2007  . HYPERLIPIDEMIA 05/27/2007  . Gout 05/27/2007  . Essential hypertension 05/27/2007  . Osteoarthrosis, unspecified whether generalized or localized, unspecified site 05/27/2007  . RENAL CALCULUS, HX OF 05/27/2007    Past Surgical History:    Procedure Laterality Date  . CORONARY ANGIOPLASTY WITH STENT PLACEMENT    . FINGER SURGERY     left index finger  . I & D Toe  05/19/2012   rt great toe  . TONSILLECTOMY         Home Medications    Prior to Admission medications   Medication Sig Start Date End Date Taking? Authorizing Provider  ALPRAZolam Prudy Feeler(XANAX) 1 MG tablet TAKE 1 TABLET BY MOUTH 3 TIMES A DAY AS NEEDED 03/15/17   Nelwyn SalisburyFry, Stephen A, MD  amLODipine (NORVASC) 10 MG tablet TAKE 1 TABLET BY MOUTH  DAILY 01/28/17   Lewayne Buntingrenshaw, Brian S, MD  Aspirin-Acetaminophen-Caffeine (GOODYS EXTRA STRENGTH) [phone] MG PACK Take 1 Package by mouth daily. 07/06/15   Nelwyn SalisburyFry, Stephen A, MD  cloNIDine (CATAPRES) 0.3 MG tablet TAKE 1 TABLET BY MOUTH  TWICE A DAY 01/28/17   Lewayne Buntingrenshaw, Brian S, MD  fenofibrate 160 MG tablet TAKE 1 TABLET BY MOUTH AT  BEDTIME 01/28/17   Lewayne Buntingrenshaw, Brian S, MD  glipiZIDE (GLUCOTROL) 10 MG tablet TAKE 1 TABLET (10 MG TOTAL) BY MOUTH 2 (TWO) TIMES DAILY BEFORE A MEAL. 04/08/17   Nelwyn SalisburyFry, Stephen A, MD  HYDROcodone-acetaminophen (NORCO) 5-325 MG tablet Take 1 tablet by mouth every 6 (six) hours as needed. 06/03/17   Nelwyn SalisburyFry, Stephen A, MD  indomethacin (INDOCIN SR) 75 MG CR capsule Take 1 capsule (75 mg total) by mouth 2 (two) times  daily with a meal. 09/17/16   Nelwyn SalisburyFry, Stephen A, MD  levothyroxine (SYNTHROID, LEVOTHROID) 112 MCG tablet TAKE 1 TABLET BY MOUTH EVERY DAY 03/04/17   Nelwyn SalisburyFry, Stephen A, MD  lisinopril-hydrochlorothiazide (PRINZIDE,ZESTORETIC) 20-12.5 MG tablet TAKE 1 TABLET BY MOUTH  TWICE DAILY AT 10AM AND 5PM 01/28/17   Lewayne Buntingrenshaw, Brian S, MD  metFORMIN (GLUCOPHAGE) 1000 MG tablet TAKE 1 TABLET BY MOUTH TWICE DAILY WITH A MEAL 05/31/16   Nelwyn SalisburyFry, Stephen A, MD  methylPREDNISolone (MEDROL DOSEPAK) 4 MG TBPK tablet As directed 01/11/17   Nelwyn SalisburyFry, Stephen A, MD  metoprolol succinate (TOPROL-XL) 100 MG 24 hr tablet TAKE 1 TABLET BY MOUTH  TWICE A DAY 01/28/17   Lewayne Buntingrenshaw, Brian S, MD  nitroGLYCERIN (NITROSTAT) 0.4 MG SL tablet Place 1 tablet (0.4 mg total)  under the tongue every 5 (five) minutes as needed. For chest pain. Patient not taking: Reported on 12/27/2015 10/26/15   Lewayne Buntingrenshaw, Brian S, MD  ONE TOUCH ULTRA TEST test strip 1 EACH BY OTHER ROUTE DAILY. USE AS INSTRUCTED 05/01/16   Nelwyn SalisburyFry, Stephen A, MD  rosuvastatin (CRESTOR) 40 MG tablet TAKE 1 TABLET BY MOUTH  EVERY DAY 01/28/17   Lewayne Buntingrenshaw, Brian S, MD  traMADol (ULTRAM) 50 MG tablet Take 2 tablets (100 mg total) by mouth at bedtime as needed for severe pain. Patient not taking: Reported on 01/11/2017 12/27/15   Nelwyn SalisburyFry, Stephen A, MD  zolpidem (AMBIEN) 10 MG tablet TAKE 1 TABLET BY MOUTH EVERY DAY AT BEDTIME 02/14/17   Nelwyn SalisburyFry, Stephen A, MD    Family History Family History  Problem Relation Age of Onset  . Heart attack Mother   . Arthritis Unknown   . Coronary artery disease Unknown   . Diabetes Unknown   . Hypertension Unknown   . Prostate cancer Unknown   . Stroke Unknown     Social History Social History   Tobacco Use  . Smoking status: Never Smoker  . Smokeless tobacco: Current User    Types: Chew  . Tobacco comment: occ  Substance Use Topics  . Alcohol use: Yes    Alcohol/week: 0.0 oz    Comment: weekends  . Drug use: No     Allergies   Patient has no known allergies.   Review of Systems Review of Systems  Constitutional: Positive for chills and fever.  HENT: Negative for congestion.   Respiratory: Positive for cough. Negative for shortness of breath.   Cardiovascular: Negative for chest pain.  Gastrointestinal: Negative for abdominal pain, diarrhea, nausea and vomiting.  Genitourinary: Positive for decreased urine volume. Negative for dysuria.  Musculoskeletal: Negative for arthralgias.  Skin: Positive for color change.  Neurological: Positive for numbness.  All other systems reviewed and are negative.    Physical Exam Updated Vital Signs BP (!) 156/81 (BP Location: Right Arm)   Pulse 70   Temp 100.2 F (37.9 C) (Oral)   Resp 16   Ht 6' (1.829 m)   Wt 106.6 kg  (235 lb)   SpO2 97%   BMI 31.87 kg/m   Physical Exam  Constitutional: He is oriented to person, place, and time. He appears well-developed and well-nourished. No distress.  HENT:  Head: Normocephalic and atraumatic.  Eyes: Conjunctivae are normal. Pupils are equal, round, and reactive to light. Right eye exhibits no discharge. Left eye exhibits no discharge. No scleral icterus.  Neck: Normal range of motion.  Cardiovascular: Normal rate and regular rhythm. Exam reveals no gallop and no friction rub.  No murmur heard. Pulmonary/Chest: Effort  normal and breath sounds normal. No stridor. No respiratory distress. He has no wheezes. He has no rales. He exhibits no tenderness.  Abdominal: Soft. Bowel sounds are normal. He exhibits no distension. There is no tenderness.  Musculoskeletal:  Left foot: Great toe has ulcer on the tip. There is redness extending to the mid foot. Toe is macerated. See picture for details  Neurological: He is alert and oriented to person, place, and time.  Skin: Skin is warm and dry.  Psychiatric: He has a normal mood and affect. His behavior is normal.  Nursing note and vitals reviewed.        ED Treatments / Results  Labs (all labs ordered are listed, but only abnormal results are displayed) Labs Reviewed  COMPREHENSIVE METABOLIC PANEL - Abnormal; Notable for the following components:      Result Value   Glucose, Bld 157 (*)    Creatinine, Ser 1.34 (*)    GFR calc non Af Amer 57 (*)    All other components within normal limits  CBC WITH DIFFERENTIAL/PLATELET - Abnormal; Notable for the following components:   RBC 3.98 (*)    Hemoglobin 12.6 (*)    HCT 38.7 (*)    Lymphs Abs 0.5 (*)    All other components within normal limits  URINALYSIS, ROUTINE W REFLEX MICROSCOPIC - Abnormal; Notable for the following components:   Hgb urine dipstick SMALL (*)    Ketones, ur 5 (*)    Protein, ur 30 (*)    All other components within normal limits  I-STAT CG4  LACTIC ACID, ED  I-STAT CG4 LACTIC ACID, ED    EKG  EKG Interpretation None       Radiology Dg Chest 2 View  Result Date: 07/09/2017 CLINICAL DATA:  Productive cough for 1 day EXAM: CHEST  2 VIEW COMPARISON:  July 16, 2012 FINDINGS: The heart size and mediastinal contours are within normal limits. Both lungs are clear. The visualized skeletal structures are unremarkable. IMPRESSION: No active cardiopulmonary disease. Electronically Signed   By: Sherian Rein M.D.   On: 07/09/2017 21:46   Dg Toe Great Left  Result Date: 07/09/2017 CLINICAL DATA:  Left great toe infection over the weekend with swelling. EXAM: LEFT GREAT TOE COMPARISON:  None. FINDINGS: Possible soft tissue ulcer on the tip of the great toe. No soft tissue gas or opaque foreign body. No evidence of osteomyelitis. First MTP osteoarthritis with spurring. There is spurring at the medially subluxed sesamoids. Remote fifth metatarsal shaft fracture seen in the lateral projection. IMPRESSION: No opaque foreign body, soft tissue gas, or acute osseous finding. Electronically Signed   By: Marnee Spring M.D.   On: 07/09/2017 14:54    Procedures Procedures (including critical care time)  Medications Ordered in ED Medications  doxycycline (VIBRAMYCIN) 100 mg in dextrose 5 % 250 mL IVPB (100 mg Intravenous New Bag/Given 07/09/17 2202)  acetaminophen (TYLENOL) tablet 650 mg (650 mg Oral Given 07/09/17 2026)  sodium chloride 0.9 % bolus 1,000 mL (0 mLs Intravenous Stopped 07/09/17 2256)     Initial Impression / Assessment and Plan / ED Course  I have reviewed the triage vital signs and the nursing notes.  Pertinent labs & imaging results that were available during my care of the patient were reviewed by me and considered in my medical decision making (see chart for details).  56 year old male presents with left great toe cellulitis and fever. He is febrile to 103 and is hypertensive. Otherwise vitals are  normal. He has an  obvious source for his fever but also reports a productive cough. CXR is negative. CBC is remarkable for mild anemia and is without leukocytosis. CMP is remarkable for hyperglycemia and mild AKI (1.34 today). Lactic acid is 1.65. UA has 5 ketones, small hgb, 30 protein. Fluids and IV Doxy ordered.   12:08 AM Reassessed patient. He states he wants to go home. Will d/c with 14 days of Doxy BID and close follow up with PCP. He was given strict return precautions if redness is spreading over the next 2 days.   Final Clinical Impressions(s) / ED Diagnoses   Final diagnoses:  Cellulitis of toe of left foot    ED Discharge Orders    None       Bethel Born, PA-C 07/10/17 0009    Nira Conn, MD 07/10/17 [id number]

## 2017-07-09 NOTE — Discharge Instructions (Addendum)
Take Doxycycline twice daily for the next 2 weeks Take Tylenol or Ibuprofen for fever Drink plenty of fluids Follow up with your doctor. Return if worsening

## 2017-07-09 NOTE — ED Notes (Signed)
Patient transported to X-ray 

## 2017-07-10 ENCOUNTER — Other Ambulatory Visit: Payer: Self-pay

## 2017-07-10 NOTE — ED Notes (Signed)
Toe dressing changed!

## 2017-07-26 ENCOUNTER — Other Ambulatory Visit: Payer: Self-pay | Admitting: Family Medicine

## 2017-09-11 ENCOUNTER — Other Ambulatory Visit: Payer: Self-pay | Admitting: Family Medicine

## 2017-09-11 ENCOUNTER — Other Ambulatory Visit: Payer: Self-pay | Admitting: Cardiology

## 2017-09-11 NOTE — Telephone Encounter (Signed)
REFILL 

## 2017-09-12 NOTE — Telephone Encounter (Signed)
Last OV 07/09/2017  Last refilled 02/14/2017 disp 90 with 1 refill   Sent to PCP for approval

## 2017-09-13 NOTE — Telephone Encounter (Signed)
Call in #[phone] with one rf 

## 2017-09-13 NOTE — Telephone Encounter (Signed)
Rx was called in to pharmacy. 

## 2017-09-28 ENCOUNTER — Other Ambulatory Visit: Payer: Self-pay | Admitting: Family Medicine

## 2017-10-18 ENCOUNTER — Other Ambulatory Visit: Payer: Self-pay | Admitting: Family Medicine

## 2017-10-18 NOTE — Telephone Encounter (Signed)
Call in #[phone] with one rf 

## 2017-10-18 NOTE — Telephone Encounter (Signed)
Last OV 01/11/2017  Last refilled 03/15/2017 disp 270 with 1 refill   Sent to PCP for approval

## 2017-11-07 DIAGNOSIS — J209 Acute bronchitis, unspecified: Secondary | ICD-10-CM | POA: Diagnosis not present

## 2017-11-24 DIAGNOSIS — H6983 Other specified disorders of Eustachian tube, bilateral: Secondary | ICD-10-CM | POA: Diagnosis not present

## 2017-11-24 DIAGNOSIS — J209 Acute bronchitis, unspecified: Secondary | ICD-10-CM | POA: Diagnosis not present

## 2017-11-25 ENCOUNTER — Other Ambulatory Visit: Payer: Self-pay | Admitting: Cardiology

## 2017-11-25 NOTE — Telephone Encounter (Signed)
Rx(s) sent to pharmacy electronically.  

## 2017-12-03 ENCOUNTER — Ambulatory Visit: Payer: 59 | Admitting: Family Medicine

## 2017-12-03 ENCOUNTER — Encounter: Payer: Self-pay | Admitting: Family Medicine

## 2017-12-03 VITALS — BP 142/70 | HR 64 | Temp 99.0°F | Ht 72.0 in | Wt 231.8 lb

## 2017-12-03 DIAGNOSIS — E118 Type 2 diabetes mellitus with unspecified complications: Secondary | ICD-10-CM | POA: Insufficient documentation

## 2017-12-03 DIAGNOSIS — M1000 Idiopathic gout, unspecified site: Secondary | ICD-10-CM | POA: Diagnosis not present

## 2017-12-03 DIAGNOSIS — N138 Other obstructive and reflux uropathy: Secondary | ICD-10-CM | POA: Diagnosis not present

## 2017-12-03 DIAGNOSIS — N401 Enlarged prostate with lower urinary tract symptoms: Secondary | ICD-10-CM

## 2017-12-03 DIAGNOSIS — E039 Hypothyroidism, unspecified: Secondary | ICD-10-CM | POA: Diagnosis not present

## 2017-12-03 DIAGNOSIS — I10 Essential (primary) hypertension: Secondary | ICD-10-CM

## 2017-12-03 DIAGNOSIS — E785 Hyperlipidemia, unspecified: Secondary | ICD-10-CM | POA: Diagnosis not present

## 2017-12-03 LAB — CBC WITH DIFFERENTIAL/PLATELET
BASOS ABS: 0.1 10*3/uL (ref 0.0–0.1)
Basophils Relative: 1 % (ref 0.0–3.0)
EOS ABS: 0.2 10*3/uL (ref 0.0–0.7)
Eosinophils Relative: 2.9 % (ref 0.0–5.0)
HCT: 39.8 % (ref 39.0–52.0)
Hemoglobin: 13.2 g/dL (ref 13.0–17.0)
LYMPHS ABS: 0.5 10*3/uL — AB (ref 0.7–4.0)
Lymphocytes Relative: 8.6 % — ABNORMAL LOW (ref 12.0–46.0)
MCHC: 33.1 g/dL (ref 30.0–36.0)
MCV: 93.6 fl (ref 78.0–100.0)
Monocytes Absolute: 0.7 10*3/uL (ref 0.1–1.0)
Monocytes Relative: 11.9 % (ref 3.0–12.0)
NEUTROS ABS: 4.3 10*3/uL (ref 1.4–7.7)
NEUTROS PCT: 75.6 % (ref 43.0–77.0)
PLATELETS: 200 10*3/uL (ref 150.0–400.0)
RBC: 4.26 Mil/uL (ref 4.22–5.81)
RDW: 14.7 % (ref 11.5–15.5)
WBC: 5.6 10*3/uL (ref 4.0–10.5)

## 2017-12-03 LAB — POC URINALSYSI DIPSTICK (AUTOMATED)
BILIRUBIN UA: NEGATIVE
Blood, UA: NEGATIVE
Glucose, UA: NEGATIVE
KETONES UA: NEGATIVE
Leukocytes, UA: NEGATIVE
Nitrite, UA: NEGATIVE
PH UA: 5.5 (ref 5.0–8.0)
Spec Grav, UA: >=1.03 — AB (ref 1.010–1.025)
Urobilinogen, UA: 0.2 E.U./dL

## 2017-12-03 LAB — HEPATIC FUNCTION PANEL
ALBUMIN: 3.8 g/dL (ref 3.5–5.2)
ALT: 31 U/L (ref 0–53)
AST: 38 U/L — AB (ref 0–37)
Alkaline Phosphatase: 56 U/L (ref 39–117)
Bilirubin, Direct: 0.1 mg/dL (ref 0.0–0.3)
TOTAL PROTEIN: 6.9 g/dL (ref 6.0–8.3)
Total Bilirubin: 0.3 mg/dL (ref 0.2–1.2)

## 2017-12-03 LAB — BASIC METABOLIC PANEL
BUN: 9 mg/dL (ref 6–23)
CHLORIDE: 104 meq/L (ref 96–112)
CO2: 31 mEq/L (ref 19–32)
CREATININE: 0.94 mg/dL (ref 0.40–1.50)
Calcium: 8.6 mg/dL (ref 8.4–10.5)
GFR: 87.62 mL/min (ref >60.00)
Glucose, Bld: 124 mg/dL — ABNORMAL HIGH (ref 70–99)
Potassium: 4.2 mEq/L (ref 3.5–5.1)
Sodium: 143 mEq/L (ref 135–145)

## 2017-12-03 LAB — LDL CHOLESTEROL, DIRECT: LDL DIRECT: 129 mg/dL

## 2017-12-03 LAB — LIPID PANEL
CHOL/HDL RATIO: 5
CHOLESTEROL: 205 mg/dL — AB (ref 0–200)
HDL: 43.6 mg/dL (ref >39.00)
NonHDL: 161.63
TRIGLYCERIDES: 220 mg/dL — AB (ref 0.0–149.0)
VLDL: 44 mg/dL — ABNORMAL HIGH (ref 0.0–40.0)

## 2017-12-03 LAB — URIC ACID: URIC ACID, SERUM: 7.2 mg/dL (ref 4.0–7.8)

## 2017-12-03 LAB — HEMOGLOBIN A1C: Hgb A1c MFr Bld: 7 % — ABNORMAL HIGH (ref 4.6–6.5)

## 2017-12-03 LAB — PSA: PSA: 1.51 ng/mL (ref 0.10–4.00)

## 2017-12-03 LAB — TSH: TSH: 2.54 u[IU]/mL (ref 0.35–4.50)

## 2017-12-03 MED ORDER — INDOMETHACIN ER 75 MG PO CPCR: 75.0000 mg | ORAL_CAPSULE | Freq: Two times a day (BID) | ORAL | 3 refills | Status: DC

## 2017-12-03 MED ORDER — FENOFIBRATE 160 MG PO TABS: 160.0000 mg | ORAL_TABLET | Freq: Every day | ORAL | 3 refills | Status: DC

## 2017-12-03 MED ORDER — ZOLPIDEM TARTRATE 10 MG PO TABS: 10.0000 mg | ORAL_TABLET | Freq: Every day | ORAL | 1 refills | Status: DC

## 2017-12-03 MED ORDER — CLONIDINE HCL 0.3 MG PO TABS: 0.3000 mg | ORAL_TABLET | Freq: Two times a day (BID) | ORAL | 3 refills | Status: DC

## 2017-12-03 MED ORDER — ROSUVASTATIN CALCIUM 40 MG PO TABS: 40.0000 mg | ORAL_TABLET | Freq: Every day | ORAL | 3 refills | Status: DC

## 2017-12-03 MED ORDER — LEVOTHYROXINE SODIUM 112 MCG PO TABS: 112.0000 ug | ORAL_TABLET | Freq: Every day | ORAL | 3 refills | Status: DC

## 2017-12-03 MED ORDER — AMLODIPINE BESYLATE 10 MG PO TABS: 10.0000 mg | ORAL_TABLET | Freq: Every day | ORAL | 3 refills | Status: DC

## 2017-12-03 MED ORDER — METOPROLOL SUCCINATE ER 100 MG PO TB24: 100.0000 mg | ORAL_TABLET | Freq: Every day | ORAL | 3 refills | Status: DC

## 2017-12-03 MED ORDER — HYDROCODONE-ACETAMINOPHEN 5-325 MG PO TABS: 1.0000 | ORAL_TABLET | Freq: Four times a day (QID) | ORAL | 0 refills | Status: DC | PRN

## 2017-12-03 MED ORDER — GLIPIZIDE 10 MG PO TABS: ORAL_TABLET | ORAL | 3 refills | Status: DC

## 2017-12-03 MED ORDER — LISINOPRIL-HYDROCHLOROTHIAZIDE 20-12.5 MG PO TABS: ORAL_TABLET | ORAL | 3 refills | Status: DC

## 2017-12-03 MED ORDER — METFORMIN HCL 1000 MG PO TABS: ORAL_TABLET | ORAL | 3 refills | Status: DC

## 2017-12-03 NOTE — Progress Notes (Signed)
   Subjective:    Patient ID: Robert Huang, male    DOB: 08-04-1961, 57 y.o.   MRN: [id number]  HPI Here to follow up. He feels well. His BP is stable. His am fasting glucoses average 120.    Review of Systems  Constitutional: Negative.   Respiratory: Negative.   Cardiovascular: Negative.   Musculoskeletal: Negative.   Neurological: Negative.        Objective:   Physical Exam  Constitutional: He is oriented to person, place, and time. He appears well-developed and well-nourished.  Neck: No thyromegaly present.  Cardiovascular: Normal rate, regular rhythm, normal heart sounds and intact distal pulses.  Pulmonary/Chest: Effort normal and breath sounds normal. No respiratory distress. He has no wheezes. He has no rales.  Lymphadenopathy:    He has no cervical adenopathy.  Neurological: He is alert and oriented to person, place, and time.          Assessment & Plan:  HTN is stable. Check fasting labs for TSH, lipids, A1c, etc. Gershon CraneStephen Janeane Cozart, MD

## 2017-12-28 ENCOUNTER — Other Ambulatory Visit: Payer: Self-pay | Admitting: Cardiology

## 2017-12-30 NOTE — Telephone Encounter (Signed)
REFILL 

## 2018-01-12 ENCOUNTER — Encounter: Payer: Self-pay | Admitting: Family Medicine

## 2018-01-13 ENCOUNTER — Encounter: Payer: 59 | Admitting: Family Medicine

## 2018-01-13 DIAGNOSIS — Z0289 Encounter for other administrative examinations: Secondary | ICD-10-CM

## 2018-01-14 ENCOUNTER — Encounter: Payer: Self-pay | Admitting: Family Medicine

## 2018-01-14 ENCOUNTER — Ambulatory Visit (INDEPENDENT_AMBULATORY_CARE_PROVIDER_SITE_OTHER): Payer: 59 | Admitting: Family Medicine

## 2018-01-14 VITALS — BP 116/62 | HR 57 | Temp 97.7°F | Ht 70.0 in | Wt 228.8 lb

## 2018-01-14 DIAGNOSIS — Z0000 Encounter for general adult medical examination without abnormal findings: Secondary | ICD-10-CM
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
## 2018-01-15 ENCOUNTER — Telehealth: Payer: Self-pay | Admitting: Family Medicine

## 2018-01-15 ENCOUNTER — Encounter: Payer: Self-pay | Admitting: *Deleted

## 2018-01-15 NOTE — Telephone Encounter (Signed)
Sent to PCP to advise correct dose directions   Thanks

## 2018-01-15 NOTE — Telephone Encounter (Signed)
This encounter was created in error - please disregard.

## 2018-01-15 NOTE — Telephone Encounter (Signed)
Pt states he picked up a refill for his metoprolol and instructions read "1 tab daily".  Pt states he has always taken the Toprol 100 mg- ER twice a day.  Was seen by Dr. Clent Ridges yesterday.  Please advise: [phone]

## 2018-01-16 MED ORDER — METOPROLOL SUCCINATE ER 100 MG PO TB24: 100.0000 mg | ORAL_TABLET | Freq: Two times a day (BID) | ORAL | 3 refills | Status: DC

## 2018-01-16 NOTE — Telephone Encounter (Signed)
This was corrected

## 2018-01-16 NOTE — Addendum Note (Signed)
Addended by: Gershon Crane A on: 01/16/2018 10:42 AM   Modules accepted: Orders

## 2018-02-16 ENCOUNTER — Other Ambulatory Visit: Payer: Self-pay | Admitting: Family Medicine

## 2018-02-17 ENCOUNTER — Encounter: Payer: Self-pay | Admitting: Family Medicine

## 2018-02-17 NOTE — Telephone Encounter (Signed)
Last V 01/14/2018   Last refilled 10/18/2017 disp 270 with 1 refill    Sent to PCP for approval

## 2018-03-03 ENCOUNTER — Other Ambulatory Visit: Payer: Self-pay | Admitting: Family Medicine

## 2018-03-03 NOTE — Telephone Encounter (Signed)
Last OV 01/14/2018    Last refilled 10/18/2017 disp 270 with 1 refill   Sent to PCP for approval

## 2018-03-04 NOTE — Telephone Encounter (Signed)
NO, this is not due until 04-17-18

## 2018-03-05 ENCOUNTER — Other Ambulatory Visit: Payer: Self-pay | Admitting: Family Medicine

## 2018-03-05 ENCOUNTER — Encounter: Payer: Self-pay | Admitting: Family Medicine

## 2018-03-05 NOTE — Telephone Encounter (Signed)
Last OV 01/14/2018   Last refilled 10/18/2017 disp 270 with 1 refill   Sent to PCP for approval

## 2018-03-05 NOTE — Telephone Encounter (Signed)
Call the pharmacy and approve them to refill the Alprazolam immediately

## 2018-03-06 ENCOUNTER — Other Ambulatory Visit: Payer: Self-pay

## 2018-03-06 MED ORDER — AMLODIPINE BESYLATE 10 MG PO TABS: 10.0000 mg | ORAL_TABLET | Freq: Every day | ORAL | 3 refills | Status: DC

## 2018-03-06 MED ORDER — ROSUVASTATIN CALCIUM 40 MG PO TABS: 40.0000 mg | ORAL_TABLET | Freq: Every day | ORAL | 3 refills | Status: DC

## 2018-03-06 MED ORDER — ALPRAZOLAM 1 MG PO TABS: 1.0000 mg | ORAL_TABLET | Freq: Three times a day (TID) | ORAL | 0 refills | Status: DC | PRN

## 2018-03-06 MED ORDER — FENOFIBRATE 160 MG PO TABS: 160.0000 mg | ORAL_TABLET | Freq: Every day | ORAL | 3 refills | Status: DC

## 2018-03-06 MED ORDER — GLIPIZIDE 10 MG PO TABS: ORAL_TABLET | ORAL | 3 refills | Status: DC

## 2018-03-06 NOTE — Telephone Encounter (Signed)
Please call in refills for all of these

## 2018-03-06 NOTE — Telephone Encounter (Signed)
Alprazalam, Amlodopine, Fenofibrate, Glipizide, Rosuvastatin  Call in xanax 270 with no refills

## 2018-05-13 NOTE — Progress Notes (Signed)
HPI: FU coronary artery disease, hypertension, diabetes, and hyperlipidemia. He had a drug-eluting stent to his circumflex and a drug-eluting stent to his right coronary artery in March of 2010. His LV function is normal. Abdominal ultrasound in December 2010 showed no aneurysm and no renal artery stenosis. Nuclear study November 2013 showed ejection fraction 61% and normal perfusion. Since I last saw him, the patient has dyspnea with more extreme activities but not with routine activities. It is relieved with rest. It is not associated with chest pain. There is no orthopnea, PND or pedal edema. There is no syncope or palpitations. There is no exertional chest pain.   Current Outpatient Medications  Medication Sig Dispense Refill  . ALPRAZolam (XANAX) 1 MG tablet Take 1 tablet (1 mg total) by mouth 3 (three) times daily as needed. 270 tablet 0  . amLODipine (NORVASC) 10 MG tablet Take 1 tablet (10 mg total) by mouth daily. <PLEASE MAKE APPOINTMENT FOR REFILLS> 90 tablet 3  . Aspirin-Acetaminophen-Caffeine (GOODYS EXTRA STRENGTH) 500-325-65 MG PACK Take 1 Package by mouth daily. 90 each 3  . cloNIDine (CATAPRES) 0.3 MG tablet Take 1 tablet (0.3 mg total) by mouth 2 (two) times daily. NEED OV. 180 tablet 3  . fenofibrate 160 MG tablet Take 1 tablet (160 mg total) by mouth daily. NEED OV. 90 tablet 3  . glipiZIDE (GLUCOTROL) 10 MG tablet TAKE ONE TABLET BY MOUTH TWICE A DAY BEFORE A MEAL * NEED OFFICE VISIT FOR FUTURE REFILLS * 180 tablet 3  . HYDROcodone-acetaminophen (NORCO) 5-325 MG tablet Take 1 tablet by mouth every 6 (six) hours as needed. 20 tablet 0  . indomethacin (INDOCIN SR) 75 MG CR capsule Take 1 capsule (75 mg total) by mouth 2 (two) times daily with a meal. 180 capsule 3  . levothyroxine (SYNTHROID, LEVOTHROID) 112 MCG tablet Take 1 tablet (112 mcg total) by mouth daily. 90 tablet 3  . lisinopril-hydrochlorothiazide (PRINZIDE,ZESTORETIC) 20-12.5 MG tablet TAKE 1 TABLET BY MOUTH   TWICE DAILY AT 10AM AND 5PM 180 tablet 0  . metFORMIN (GLUCOPHAGE) 1000 MG tablet TAKE 1 TABLET BY MOUTH TWO TIMES A DAY WITH A MEAL 180 tablet 3  . metoprolol succinate (TOPROL-XL) 100 MG 24 hr tablet Take 1 tablet (100 mg total) by mouth 2 (two) times daily. 180 tablet 3  . nitroGLYCERIN (NITROSTAT) 0.4 MG SL tablet Place 1 tablet (0.4 mg total) under the tongue every 5 (five) minutes as needed. For chest pain. 25 tablet 5  . ONE TOUCH ULTRA TEST test strip 1 EACH BY OTHER ROUTE DAILY. USE AS INSTRUCTED 100 each 3  . rosuvastatin (CRESTOR) 40 MG tablet Take 1 tablet (40 mg total) by mouth daily. <PLEASE MAKE APPOINTMENT FOR REFILLS> 90 tablet 3  . zolpidem (AMBIEN) 10 MG tablet Take 1 tablet (10 mg total) by mouth at bedtime. 90 tablet 1   No current facility-administered medications for this visit.      Past Medical History:  Diagnosis Date  . Anxiety   . CAD (coronary artery disease)    sees Dr. Jens Somrenshaw, normal Stress test 07-16-12   . Diabetes mellitus   . DJD (degenerative joint disease)   . Gout   . Hyperlipidemia   . Hypertension   . Hypothyroid   . Osteoarthritis   . Renal calculus    hx  . Umbilical hernia     Past Surgical History:  Procedure Laterality Date  . AMPUTATION  07/17/2012   Procedure: AMPUTATION RAY;  Surgeon: [name], MD;  Location: [facility] OR;  Service: Orthopedics;  Laterality: Right;  right hallux amputation (1st ray resection )  . CORONARY ANGIOPLASTY WITH STENT PLACEMENT    . FINGER SURGERY     left index finger  . I & D Toe  05/19/2012   rt great toe  . I&D EXTREMITY  05/19/2012   Procedure: IRRIGATION AND DEBRIDEMENT EXTREMITY;  Surgeon: [name], MD;  Location: [facility] OR;  Service: Orthopedics;  Laterality: Right;  Right Great toe  . TONSILLECTOMY      Social History   Socioeconomic History  . Marital status: Married    Spouse name: Not on file  . Number of children: Not on file  . Years of education: Not on file  . Highest education  level: Not on file  Occupational History  . Not on file  Social Needs  . Financial resource strain: Not on file  . Food insecurity:    Worry: Not on file    Inability: Not on file  . Transportation needs:    Medical: Not on file    Non-medical: Not on file  Tobacco Use  . Smoking status: Never Smoker  . Smokeless tobacco: Current User    Types: Chew  . Tobacco comment: occ  Substance and Sexual Activity  . Alcohol use: Yes    Alcohol/week: 0.0 standard drinks    Comment: weekends  . Drug use: No  . Sexual activity: Yes  Lifestyle  . Physical activity:    Days per week: Not on file    Minutes per session: Not on file  . Stress: Not on file  Relationships  . Social connections:    Talks on phone: Not on file    Gets together: Not on file    Attends religious service: Not on file    Active member of club or organization: Not on file    Attends meetings of clubs or organizations: Not on file    Relationship status: Not on file  . Intimate partner violence:    Fear of current or ex partner: Not on file    Emotionally abused: Not on file    Physically abused: Not on file    Forced sexual activity: Not on file  Other Topics Concern  . Not on file  Social History Narrative  . Not on file    Family History  Problem Relation Age of Onset  . Heart attack Mother   . Arthritis Unknown   . Coronary artery disease Unknown   . Diabetes Unknown   . Hypertension Unknown   . Prostate cancer Unknown   . Stroke Unknown     ROS: no fevers or chills, productive cough, hemoptysis, dysphasia, odynophagia, melena, hematochezia, dysuria, hematuria, rash, seizure activity, orthopnea, PND, pedal edema, claudication. Remaining systems are negative.  Physical Exam: Well-developed well-nourished in no acute distress.  Skin is warm and dry.  HEENT is normal.  Neck is supple.  Chest is clear to auscultation with normal expansion.  Cardiovascular exam is regular rate and rhythm.    Abdominal exam nontender or distended. No masses palpated. Extremities show no edema. neuro grossly intact  ECG-sinus rhythm at a rate of 70, left ventricular hypertrophy.  Personally reviewed  A/P  1 coronary artery disease-plan to continue medical therapy.  Continue aspirin and statin.  2 hypertension-patient's blood pressure is controlled.  Continue present medications.  3 hyperlipidemia-last LDL April 2019 129.  Add Zetia 10 mg daily.  Continue Crestor  40 mg daily.  Check lipids and liver in 4 to 6 weeks.  If LDL remains elevated could consider Repatha.  [name], MD

## 2018-05-21 ENCOUNTER — Ambulatory Visit: Payer: Managed Care, Other (non HMO) | Admitting: Cardiology

## 2018-05-21 ENCOUNTER — Encounter: Payer: Self-pay | Admitting: Cardiology

## 2018-05-21 VITALS — BP 118/66 | HR 70 | Ht 72.0 in | Wt 230.0 lb

## 2018-05-21 DIAGNOSIS — E7800 Pure hypercholesterolemia, unspecified: Secondary | ICD-10-CM

## 2018-05-21 DIAGNOSIS — I10 Essential (primary) hypertension: Secondary | ICD-10-CM

## 2018-05-21 DIAGNOSIS — I2510 Atherosclerotic heart disease of native coronary artery without angina pectoris: Secondary | ICD-10-CM | POA: Diagnosis not present

## 2018-05-21 MED ORDER — EZETIMIBE 10 MG PO TABS: 10.0000 mg | ORAL_TABLET | Freq: Every day | ORAL | 3 refills | Status: DC

## 2018-05-21 MED ORDER — NITROGLYCERIN 0.4 MG SL SUBL: 0.4000 mg | SUBLINGUAL_TABLET | SUBLINGUAL | 5 refills | Status: DC | PRN

## 2018-05-21 NOTE — Patient Instructions (Signed)
Medication Instructions:   START EZETIMIBE 10 MG ONCE DAILY   Labwork:  Your physician recommends that you return for lab work in: 6 WEEKS PRIOR TO EATING  Follow-Up:  Your physician wants you to follow-up in: ONE YEAR WITH DR Shelda Pal will receive a reminder letter in the mail two months in advance. If you don't receive a letter, please call our office to schedule the follow-up appointment.   If you need a refill on your cardiac medications before your next appointment, please call your pharmacy.

## 2018-05-27 ENCOUNTER — Ambulatory Visit: Payer: Managed Care, Other (non HMO) | Admitting: Family Medicine

## 2018-05-27 ENCOUNTER — Encounter: Payer: Self-pay | Admitting: Family Medicine

## 2018-05-27 VITALS — BP 122/68 | HR 50 | Temp 98.5°F | Wt 228.1 lb

## 2018-05-27 DIAGNOSIS — M1000 Idiopathic gout, unspecified site: Secondary | ICD-10-CM

## 2018-05-27 LAB — LIPID PANEL
CHOLESTEROL TOTAL: 187 mg/dL (ref 100–199)
Chol/HDL Ratio: 4.9 ratio (ref 0.0–5.0)
HDL: 38 mg/dL — ABNORMAL LOW (ref >39)
LDL Calculated: 91 mg/dL (ref 0–99)
Triglycerides: 292 mg/dL — ABNORMAL HIGH (ref 0–149)
VLDL Cholesterol Cal: 58 mg/dL — ABNORMAL HIGH (ref 5–40)

## 2018-05-27 LAB — HEPATIC FUNCTION PANEL
ALBUMIN: 4.5 g/dL (ref 3.5–5.5)
ALK PHOS: 59 IU/L (ref 39–117)
ALT: 18 IU/L (ref 0–44)
AST: 24 IU/L (ref 0–40)
BILIRUBIN TOTAL: 0.4 mg/dL (ref 0.0–1.2)
BILIRUBIN, DIRECT: 0.12 mg/dL (ref 0.00–0.40)
TOTAL PROTEIN: 7.5 g/dL (ref 6.0–8.5)

## 2018-05-27 MED ORDER — HYDROCODONE-ACETAMINOPHEN 5-325 MG PO TABS: 1.0000 | ORAL_TABLET | ORAL | 0 refills | Status: DC | PRN

## 2018-05-27 MED ORDER — COLCHICINE 0.6 MG PO TABS: 0.6000 mg | ORAL_TABLET | Freq: Four times a day (QID) | ORAL | 5 refills | Status: DC | PRN

## 2018-05-27 MED ORDER — METHYLPREDNISOLONE ACETATE 80 MG/ML IJ SUSP
120.0000 mg | Freq: Once | INTRAMUSCULAR | Status: AC
  Administered 2018-05-27: 120 mg via INTRAMUSCULAR

## 2018-05-27 NOTE — Addendum Note (Signed)
Addended by: Marcellus Scott on: 05/27/2018 11:03 AM   Modules accepted: Orders

## 2018-05-27 NOTE — Progress Notes (Signed)
   Subjective:    Patient ID: Robert Huang, male    DOB: [birth year], 57 y.o.   MRN: [id number]  HPI Here for 3 days of swelling and pain in the left knee. No recent trauma. Indomethacin has not helped.    Review of Systems  Constitutional: Negative.   Respiratory: Negative.   Cardiovascular: Negative.   Musculoskeletal: Positive for arthralgias and joint swelling.       Objective:   Physical Exam  Constitutional: He appears well-developed and well-nourished.  Cardiovascular: Normal rate, regular rhythm, normal heart sounds and intact distal pulses.  Pulmonary/Chest: Effort normal and breath sounds normal.  Musculoskeletal:  Left knee is mildly swollen, red, and warm, tender along the lateral edge of the patella           Assessment & Plan:  Gout, given a steroid shot. Add Colchicine and Norco prn.  Gershon Crane, MD

## 2018-06-04 ENCOUNTER — Encounter (HOSPITAL_COMMUNITY): Payer: Self-pay | Admitting: Emergency Medicine

## 2018-06-04 ENCOUNTER — Emergency Department (HOSPITAL_COMMUNITY)
Admission: EM | Admit: 2018-06-04 | Discharge: 2018-06-04 | Disposition: A | Discharge status: Home / Self Care | Payer: Managed Care, Other (non HMO) | Attending: Emergency Medicine | Admitting: Emergency Medicine

## 2018-06-04 ENCOUNTER — Emergency Department (HOSPITAL_COMMUNITY): Payer: Managed Care, Other (non HMO)

## 2018-06-04 DIAGNOSIS — Z79899 Other long term (current) drug therapy: Secondary | ICD-10-CM | POA: Insufficient documentation

## 2018-06-04 DIAGNOSIS — I10 Essential (primary) hypertension: Secondary | ICD-10-CM | POA: Insufficient documentation

## 2018-06-04 DIAGNOSIS — E039 Hypothyroidism, unspecified: Secondary | ICD-10-CM | POA: Insufficient documentation

## 2018-06-04 DIAGNOSIS — L089 Local infection of the skin and subcutaneous tissue, unspecified: Secondary | ICD-10-CM

## 2018-06-04 DIAGNOSIS — L97519 Non-pressure chronic ulcer of other part of right foot with unspecified severity: Secondary | ICD-10-CM | POA: Insufficient documentation

## 2018-06-04 DIAGNOSIS — E11621 Type 2 diabetes mellitus with foot ulcer: Secondary | ICD-10-CM | POA: Insufficient documentation

## 2018-06-04 DIAGNOSIS — E11628 Type 2 diabetes mellitus with other skin complications: Secondary | ICD-10-CM

## 2018-06-04 LAB — BASIC METABOLIC PANEL
Anion gap: 9 (ref 5–15)
BUN: 16 mg/dL (ref 6–20)
CHLORIDE: 98 mmol/L (ref 98–111)
CO2: 25 mmol/L (ref 22–32)
CREATININE: 1.19 mg/dL (ref 0.61–1.24)
Calcium: 9.1 mg/dL (ref 8.9–10.3)
Glucose, Bld: 163 mg/dL — ABNORMAL HIGH (ref 70–99)
POTASSIUM: 3.3 mmol/L — AB (ref 3.5–5.1)
SODIUM: 132 mmol/L — AB (ref 135–145)

## 2018-06-04 LAB — CBC WITH DIFFERENTIAL/PLATELET
Abs Immature Granulocytes: 0.02 10*3/uL (ref 0.00–0.07)
BASOS ABS: 0.1 10*3/uL (ref 0.0–0.1)
BASOS PCT: 1 %
EOS PCT: 3 %
Eosinophils Absolute: 0.2 10*3/uL (ref 0.0–0.5)
HCT: 39.6 % (ref 39.0–52.0)
HEMOGLOBIN: 13 g/dL (ref 13.0–17.0)
Immature Granulocytes: 0 %
LYMPHS PCT: 9 %
Lymphs Abs: 0.6 10*3/uL — ABNORMAL LOW (ref 0.7–4.0)
MCH: 32.1 pg (ref 26.0–34.0)
MCHC: 32.8 g/dL (ref 30.0–36.0)
MCV: 97.8 fL (ref 80.0–100.0)
MONO ABS: 0.8 10*3/uL (ref 0.1–1.0)
Monocytes Relative: 11 %
NEUTROS ABS: 5.2 10*3/uL (ref 1.7–7.7)
NRBC: 0 % (ref 0.0–0.2)
Neutrophils Relative %: 76 %
PLATELETS: 267 10*3/uL (ref 150–400)
RBC: 4.05 MIL/uL — AB (ref 4.22–5.81)
RDW: 12.5 % (ref 11.5–15.5)
WBC: 6.9 10*3/uL (ref 4.0–10.5)

## 2018-06-04 LAB — HEMOGLOBIN A1C
HEMOGLOBIN A1C: 6.7 % — AB (ref 4.8–5.6)
MEAN PLASMA GLUCOSE: 145.59 mg/dL

## 2018-06-04 MED ORDER — SULFAMETHOXAZOLE-TRIMETHOPRIM 800-160 MG PO TABS: 1.0000 | ORAL_TABLET | Freq: Two times a day (BID) | ORAL | 0 refills | Status: AC

## 2018-06-04 MED ORDER — VANCOMYCIN HCL IN DEXTROSE 1-5 GM/200ML-% IV SOLN
1000.0000 mg | Freq: Once | INTRAVENOUS | Status: AC
  Administered 2018-06-04: 1000 mg via INTRAVENOUS
  Filled 2018-06-04: qty 200

## 2018-06-04 MED ORDER — ERYTHROMYCIN 5 MG/GM OP OINT: TOPICAL_OINTMENT | OPHTHALMIC | 0 refills | Status: DC

## 2018-06-04 MED ORDER — SODIUM CHLORIDE 0.9 % IV SOLN
2.0000 g | Freq: Once | INTRAVENOUS | Status: DC
  Filled 2018-06-04: qty 20

## 2018-06-04 MED ORDER — SODIUM CHLORIDE 0.9 % IV SOLN
2.0000 g | Freq: Once | INTRAVENOUS | Status: AC
  Administered 2018-06-04: 2 g via INTRAVENOUS
  Filled 2018-06-04: qty 20

## 2018-06-04 MED ORDER — AMOXICILLIN-POT CLAVULANATE 875-125 MG PO TABS: 1.0000 | ORAL_TABLET | Freq: Two times a day (BID) | ORAL | 0 refills | Status: DC

## 2018-06-04 NOTE — ED Triage Notes (Signed)
Pt reports hx of R great toe amputation, tends to get pressure ulcers on 2nd toe, reports "blister" to R third toe which he "lanced" Monday and had purulent drainage come out. Hx of osteomyelitis

## 2018-06-04 NOTE — ED Provider Notes (Addendum)
[facility] EMERGENCY DEPARTMENT Provider Note   CSN: [id number] Arrival date & time: 06/04/18  4098     History   Chief Complaint Chief Complaint  Patient presents with  . Wound Infection  . Foot Pain    HPI Robert Huang is a 57 y.o. male.  HPI Patient with history of diabetes and prior foot infection.  3 days ago he removed his sock and noticed he was developing a sore on the tip of his third digit on the right foot.  Yesterday he reports there was a blister appearing area that he cut open and drained a white chalky material.  He has very little sensation of the foot.  He has not noted it to be painful.  He has been applying a dressing.  No specific wound but he reports his toe does rub the front of his shoe.  No fever, no chills, no malaise.  He reports he has some loose stool that has been present for about a week.  No nausea no vomiting.  Patient does not monitor his blood sugars.  He reports that he gets the machine and test strips but he has a type I diabetic daughter that he typically gives that to.  Toe was amputated previously due to failed surgical fixation from fracture.  It became secondarily infected and ultimately had to be amputated.  This was done by Dr. Berton Bon orthopedics.  Problem #2.  Patient later advised that he had had some redness and drainage of the right eye.  It had been much worse a couple days ago.  He reports there is a small area on his eyelid that is still slightly swollen.  He reports he is having matting in the mornings and drainage.  On examination, there is a very small less than 2 mm thin as sure at the lateral lash line.  Very mild erythema of the lid without any significant swelling.  Normal range of motion of the eye.  Slight scleral injection relative to left. Past Medical History:  Diagnosis Date  . Anxiety   . CAD (coronary artery disease)    sees Dr. [name], normal Stress test 07-16-12   . Diabetes  mellitus   . DJD (degenerative joint disease)   . Gout   . Hyperlipidemia   . Hypertension   . Hypothyroid   . Osteoarthritis   . Renal calculus    hx  . Umbilical hernia     Patient Active Problem List   Diagnosis Date Noted  . Diabetes mellitus with complication (HCC) 12/03/2017  . Right hip pain 12/27/2015  . MRSA (methicillin resistant staph aureus) culture positive 12/31/2012  . Open displaced fracture of right great toe 05/20/2012  . ANXIETY 12/25/2008  . Coronary atherosclerosis 12/25/2008  . CHEST PAIN 12/25/2008  . Hypothyroidism 05/27/2007  . Dyslipidemia 05/27/2007  . Gout 05/27/2007  . Essential hypertension 05/27/2007  . Osteoarthrosis, unspecified whether generalized or localized, unspecified site 05/27/2007  . RENAL CALCULUS, HX OF 05/27/2007    Past Surgical History:  Procedure Laterality Date  . AMPUTATION  07/17/2012   Procedure: AMPUTATION RAY;  Surgeon: [name], MD;  Location: [facility] OR;  Service: Orthopedics;  Laterality: Right;  right hallux amputation (1st ray resection )  . CORONARY ANGIOPLASTY WITH STENT PLACEMENT    . FINGER SURGERY     left index finger  . I & D Toe  05/19/2012   rt great toe  . I&D EXTREMITY  05/19/2012  Procedure: IRRIGATION AND DEBRIDEMENT EXTREMITY;  Surgeon: [name], MD;  Location: [facility] OR;  Service: Orthopedics;  Laterality: Right;  Right Great toe  . TONSILLECTOMY          Home Medications    Prior to Admission medications   Medication Sig Start Date End Date Taking? Authorizing Provider  ALPRAZolam Prudy Feeler) 1 MG tablet Take 1 tablet (1 mg total) by mouth 3 (three) times daily as needed. 03/06/18  Yes [name], MD  amLODipine (NORVASC) 10 MG tablet Take 1 tablet (10 mg total) by mouth daily. <PLEASE MAKE APPOINTMENT FOR REFILLS> Patient taking differently: Take 10 mg by mouth daily.  03/06/18  Yes [name], MD  Aspirin-Acetaminophen-Caffeine (GOODYS EXTRA STRENGTH) [phone] MG PACK Take 1 Package by  mouth daily. Patient taking differently: Take 1 Package by mouth daily. Take additionally as needed during the day 07/06/15  Yes [name], MD  cloNIDine (CATAPRES) 0.3 MG tablet Take 1 tablet (0.3 mg total) by mouth 2 (two) times daily. NEED OV. Patient taking differently: Take 0.3 mg by mouth 2 (two) times daily.  12/03/17  Yes [name], MD  ezetimibe (ZETIA) 10 MG tablet Take 1 tablet (10 mg total) by mouth daily. Patient taking differently: Take 10 mg by mouth at bedtime.  05/21/18 08/19/18 Yes Lewayne Bunting, MD  fenofibrate 160 MG tablet Take 1 tablet (160 mg total) by mouth daily. NEED OV. Patient taking differently: Take 160 mg by mouth at bedtime.  03/06/18  Yes [name], MD  glipiZIDE (GLUCOTROL) 10 MG tablet TAKE ONE TABLET BY MOUTH TWICE A DAY BEFORE A MEAL * NEED OFFICE VISIT FOR FUTURE REFILLS * Patient taking differently: Take 10 mg by mouth daily before breakfast.  03/06/18  Yes [name], MD  HYDROcodone-acetaminophen (NORCO) 5-325 MG tablet Take 1 tablet by mouth every 4 (four) hours as needed for moderate pain. 05/27/18  Yes [name], MD  indomethacin (INDOCIN SR) 75 MG CR capsule Take 1 capsule (75 mg total) by mouth 2 (two) times daily with a meal. Patient taking differently: Take 75 mg by mouth as needed.  12/03/17  Yes [name], MD  levothyroxine (SYNTHROID, LEVOTHROID) 112 MCG tablet Take 1 tablet (112 mcg total) by mouth daily. 12/03/17  Yes [name], MD  lisinopril-hydrochlorothiazide (PRINZIDE,ZESTORETIC) 20-12.5 MG tablet TAKE 1 TABLET BY MOUTH  TWICE DAILY AT 10AM AND 5PM Patient taking differently: Take 1 tablet by mouth 2 (two) times daily. 10AM AND 5PM 12/30/17  Yes Lewayne Bunting, MD  metFORMIN (GLUCOPHAGE) 1000 MG tablet TAKE 1 TABLET BY MOUTH TWO TIMES A DAY WITH A MEAL Patient taking differently: Take 1,000 mg by mouth at bedtime.  12/03/17  Yes [name], MD  metoprolol succinate (TOPROL-XL) 100 MG 24 hr tablet Take 1 tablet  (100 mg total) by mouth 2 (two) times daily. 01/16/18  Yes [name], MD  nitroGLYCERIN (NITROSTAT) 0.4 MG SL tablet Place 1 tablet (0.4 mg total) under the tongue every 5 (five) minutes as needed. For chest pain. 05/21/18  Yes Lewayne Bunting, MD  rosuvastatin (CRESTOR) 40 MG tablet Take 1 tablet (40 mg total) by mouth daily. <PLEASE MAKE APPOINTMENT FOR REFILLS> Patient taking differently: Take 40 mg by mouth at bedtime.  03/06/18  Yes [name], MD  amoxicillin-clavulanate (AUGMENTIN) 875-125 MG tablet Take 1 tablet by mouth 2 (two) times daily. One po bid x 7 days 06/04/18  Arby Barrette, MD  colchicine 0.6 MG tablet Take 1 tablet (0.6 mg total) by mouth every 6 (six) hours as needed (gout). Patient not taking: Reported on 06/04/2018 05/27/18   [name], MD  erythromycin ophthalmic ointment Place a 1/2 inch ribbon of ointment into the lower eyelid. 06/04/18   Arby Barrette, MD  ONE TOUCH ULTRA TEST test strip 1 EACH BY OTHER ROUTE DAILY. USE AS INSTRUCTED 05/01/16   [name], MD  sulfamethoxazole-trimethoprim (BACTRIM DS,SEPTRA DS) 800-160 MG tablet Take 1 tablet by mouth 2 (two) times daily for 7 days. 06/04/18 06/11/18  Arby Barrette, MD  zolpidem (AMBIEN) 10 MG tablet Take 1 tablet (10 mg total) by mouth at bedtime. Patient not taking: Reported on 06/04/2018 12/03/17   [name], MD    Family History Family History  Problem Relation Age of Onset  . Heart attack Mother   . Arthritis Unknown   . Coronary artery disease Unknown   . Diabetes Unknown   . Hypertension Unknown   . Prostate cancer Unknown   . Stroke Unknown     Social History Social History   Tobacco Use  . Smoking status: Never Smoker  . Smokeless tobacco: Current User    Types: Chew  . Tobacco comment: occ  Substance Use Topics  . Alcohol use: Yes    Alcohol/week: 0.0 standard drinks    Comment: weekends  . Drug use: No     Allergies   Patient has no known allergies.   Review of  Systems Review of Systems 10 Systems reviewed and are negative for acute change except as noted in the HPI.   Physical Exam Updated Vital Signs BP 124/70   Pulse (!) 47   Temp 97.7 F (36.5 C) (Oral)   Resp 12   SpO2 100%   Physical Exam  Constitutional: He is oriented to person, place, and time. He appears well-developed and well-nourished. No distress.  HENT:  Head: Normocephalic and atraumatic.  Mouth/Throat: Oropharynx is clear and moist.  Eyes: EOM are normal.  Cardiovascular: Normal rate, regular rhythm, normal heart sounds and intact distal pulses.  Pulmonary/Chest: Effort normal and breath sounds normal.  Abdominal: Soft. He exhibits no distension. There is no tenderness. There is no guarding.  Musculoskeletal: Normal range of motion.  Erythema and wound of third digit see attached images.  Gently probing wound with sterile swab handle did not feel that it was impacting bone or penetrating visible wound base.  Dorsalis pedis pulse 2+ and full.  Calf is soft and nontender.  No edema or swelling of the foot or the ankle.  Neurological: He is alert and oriented to person, place, and time. He exhibits normal muscle tone. Coordination normal.  Skin: Skin is warm and dry.  Psychiatric: He has a normal mood and affect.         ED Treatments / Results  Labs (all labs ordered are listed, but only abnormal results are displayed) Labs Reviewed  BASIC METABOLIC PANEL - Abnormal; Notable for the following components:      Result Value   Sodium 132 (*)    Potassium 3.3 (*)    Glucose, Bld 163 (*)    All other components within normal limits  CBC WITH DIFFERENTIAL/PLATELET - Abnormal; Notable for the following components:   RBC 4.05 (*)    Lymphs Abs 0.6 (*)    All other components within normal limits  HEMOGLOBIN A1C - Abnormal; Notable for the following components:  Hgb A1c MFr Bld 6.7 (*)    All other components within normal limits    EKG None  Radiology Dg  Foot Complete Right  Result Date: 06/04/2018 CLINICAL DATA:  Recent soft tissue injury third digit with wound EXAM: RIGHT FOOT COMPLETE - 3+ VIEW COMPARISON:  None. FINDINGS: Frontal, oblique, and lateral views obtained. There is been amputation at the level of the distal first metatarsal. The stump in this area appears unremarkable. There is no acute fracture or dislocation. There is evidence of an old fracture with remodeling involving the mid the distal second metatarsal. There is also evidence of an old healed fracture of the proximal aspect of the second middle phalanx. There is no appreciable joint space narrowing or erosion. There is an inferior calcaneal spur. There is spurring in the talonavicular joint dorsally with a benign exostosis arising from the dorsal distal talus. There is no bony destruction. There is a linear radiopaque foreign body volar to the proximal phalanges seen only on the lateral view measuring 3 mm in length. No other radiopaque foreign body evident. IMPRESSION: 1. 3 mm linear radiopaque foreign body volar to the proximal phalanges seen only on the lateral view. 2.  No soft tissue air.  No bony destruction. 3. Status post amputation at the level of the distal first metatarsal. Old healed fractures involving the second metatarsal and second middle phalanx. 4.  No erosive changes. Electronically Signed   By: Bretta Bang III M.D.   On: 06/04/2018 07:59    Procedures Procedures (including critical care time)  Medications Ordered in ED Medications  vancomycin (VANCOCIN) IVPB 1000 mg/200 mL premix (1,000 mg Intravenous New Bag/Given 06/04/18 0831)  cefTRIAXone (ROCEPHIN) 2 g in sodium chloride 0.9 % 100 mL IVPB (0 g Intravenous Stopped 06/04/18 0830)     Initial Impression / Assessment and Plan / ED Course  I have reviewed the triage vital signs and the nursing notes.  Pertinent labs & imaging results that were available during my care of the patient were reviewed by me  and considered in my medical decision making (see chart for details).  Clinical Course as of Jun 05 951  Wed Jun 04, 2018  0913 Consult ordered for [facility] orthopedics.  Patient updated on results.  Vancomycin infusing.   [MP]  [phone] Consult: Reviewed with Dr. Victorino Dike.  We will try to get the patient into the office for recheck next week.  We reviewed the physical exam findings and current treatment plan.  He agrees with initiating outpatient treatment and also requests a ambulatory referral to wound care   [MP]    Clinical Course User Index [MP] Arby Barrette, MD   Diabetic foot wound without evidence of systemic infection.  X-ray does not show osteomyelitis and probing wound does not contact bone.  Is aware of follow-up plan and return precautions reviewed.  Later, patient added a second concern for ocular complaint.  Findings are very mild and by history and examination, improved significantly since this weekend.  Appears patient likely had some degree of hordeolum and conjunctivitis.  Will prescribe erythromycin and patient can continue with some compresses.  Final Clinical Impressions(s) / ED Diagnoses   Final diagnoses:  Diabetic foot infection [facility])    ED Discharge Orders         Ordered    amoxicillin-clavulanate (AUGMENTIN) 875-125 MG tablet  2 times daily     06/04/18 0848    sulfamethoxazole-trimethoprim (BACTRIM DS,SEPTRA DS) 800-160 MG tablet  2 times daily  06/04/18 0848    erythromycin ophthalmic ointment     06/04/18 0848    Ambulatory referral to Wound Clinic     06/04/18 0950           Arby Barrette, MD 06/04/18 7253    Arby Barrette, MD 06/04/18 1024

## 2018-06-04 NOTE — Discharge Instructions (Signed)
1.  Dr. Laverta [place] office will be trying to set an appointment for you next week.  Your condition is worsening, call their office to see if you can possibly be seen sooner, otherwise return to the emergency department. 2.  A referral has been placed for wound care center.  Try to be seen as soon as possible. 3.  Take antibiotics as prescribed.  Make sure that your wound stays clean, dry and dressed at all times.

## 2018-06-11 ENCOUNTER — Other Ambulatory Visit: Payer: Self-pay | Admitting: *Deleted

## 2018-06-11 MED ORDER — GLUCOSE BLOOD VI STRP: 1.0000 | ORAL_STRIP | Freq: Every day | 3 refills | Status: DC

## 2018-06-17 ENCOUNTER — Other Ambulatory Visit: Payer: Self-pay | Admitting: Family Medicine

## 2018-07-16 ENCOUNTER — Encounter: Payer: Self-pay | Admitting: Family Medicine

## 2018-07-17 NOTE — Telephone Encounter (Signed)
This was requested on 06/17/18. Pt is almost out of alprazolam

## 2018-07-17 NOTE — Telephone Encounter (Signed)
Dr. Clent RidgesFry please advise on refill of medication.  Last filled in July for #[phone].  Thanks

## 2018-07-17 NOTE — Telephone Encounter (Signed)
Routing to PCP CMA for refills! 

## 2018-07-19 MED ORDER — ALPRAZOLAM 1 MG PO TABS: 1.0000 mg | ORAL_TABLET | Freq: Three times a day (TID) | ORAL | 1 refills | Status: DC | PRN

## 2018-07-19 NOTE — Telephone Encounter (Signed)
Done

## 2018-08-06 ENCOUNTER — Other Ambulatory Visit: Payer: Self-pay | Admitting: Family Medicine

## 2018-08-09 ENCOUNTER — Encounter: Payer: Self-pay | Admitting: Family Medicine

## 2018-08-11 ENCOUNTER — Encounter: Payer: Self-pay | Admitting: Family Medicine

## 2018-08-11 NOTE — Telephone Encounter (Signed)
Dr. Clent RidgesFry please advise if pt will need an appt for refill of the pain meds.  He stated that he has another kidney stone.  Thanks

## 2018-08-11 NOTE — Telephone Encounter (Signed)
Dr. Clent RidgesFry please advise on refill of the hydrocodone.  Thanks

## 2018-08-12 NOTE — Telephone Encounter (Signed)
NO, this needs to be during a PMV

## 2018-08-13 MED ORDER — HYDROCODONE-ACETAMINOPHEN 5-325 MG PO TABS: 1.0000 | ORAL_TABLET | ORAL | 0 refills | Status: DC | PRN

## 2018-08-13 NOTE — Telephone Encounter (Signed)
I sent in #[phone]  

## 2018-09-15 ENCOUNTER — Other Ambulatory Visit: Payer: Self-pay | Admitting: Family Medicine

## 2018-09-17 NOTE — Telephone Encounter (Signed)
Dr. Fry please advise on refill. Thanks  

## 2018-09-18 NOTE — Telephone Encounter (Signed)
Call in #[phone] with one rf 

## 2018-09-22 NOTE — Telephone Encounter (Signed)
Refill has been called to the pharmacy and left on the VM.  

## 2018-10-03 ENCOUNTER — Ambulatory Visit (INDEPENDENT_AMBULATORY_CARE_PROVIDER_SITE_OTHER): Payer: Managed Care, Other (non HMO) | Admitting: Family Medicine

## 2018-10-03 ENCOUNTER — Encounter: Payer: Self-pay | Admitting: Family Medicine

## 2018-10-03 VITALS — BP 110/72 | HR 50 | Temp 99.0°F | Wt 205.2 lb

## 2018-10-03 DIAGNOSIS — R1084 Generalized abdominal pain: Secondary | ICD-10-CM

## 2018-10-03 DIAGNOSIS — R634 Abnormal weight loss: Secondary | ICD-10-CM

## 2018-10-03 DIAGNOSIS — Z0000 Encounter for general adult medical examination without abnormal findings: Secondary | ICD-10-CM

## 2018-10-03 DIAGNOSIS — Z23 Encounter for immunization: Secondary | ICD-10-CM

## 2018-10-03 DIAGNOSIS — E118 Type 2 diabetes mellitus with unspecified complications: Secondary | ICD-10-CM

## 2018-10-03 LAB — BASIC METABOLIC PANEL
BUN: 11 mg/dL (ref 6–23)
CALCIUM: 9.2 mg/dL (ref 8.4–10.5)
CO2: 30 mEq/L (ref 19–32)
Chloride: 95 mEq/L — ABNORMAL LOW (ref 96–112)
Creatinine, Ser: 0.96 mg/dL (ref 0.40–1.50)
GFR: 80.22 mL/min (ref >60.00)
Glucose, Bld: 148 mg/dL — ABNORMAL HIGH (ref 70–99)
POTASSIUM: 3.7 meq/L (ref 3.5–5.1)
SODIUM: 134 meq/L — AB (ref 135–145)

## 2018-10-03 LAB — POC URINALSYSI DIPSTICK (AUTOMATED)
BILIRUBIN UA: NEGATIVE
Glucose, UA: NEGATIVE
Ketones, UA: NEGATIVE
LEUKOCYTES UA: NEGATIVE
NITRITE UA: NEGATIVE
Protein, UA: NEGATIVE
RBC UA: NEGATIVE
Spec Grav, UA: 1.015 (ref 1.010–1.025)
UROBILINOGEN UA: 0.2 U/dL
pH, UA: 6 (ref 5.0–8.0)

## 2018-10-03 LAB — LIPID PANEL
CHOLESTEROL: 133 mg/dL (ref 0–200)
HDL: 43.5 mg/dL (ref >39.00)
LDL CALC: 61 mg/dL (ref 0–99)
NONHDL: 89.88
Total CHOL/HDL Ratio: 3
Triglycerides: 146 mg/dL (ref 0.0–149.0)
VLDL: 29.2 mg/dL (ref 0.0–40.0)

## 2018-10-03 LAB — HEPATIC FUNCTION PANEL
ALK PHOS: 73 U/L (ref 39–117)
ALT: 20 U/L (ref 0–53)
AST: 22 U/L (ref 0–37)
Albumin: 4 g/dL (ref 3.5–5.2)
BILIRUBIN TOTAL: 0.4 mg/dL (ref 0.2–1.2)
Bilirubin, Direct: 0.1 mg/dL (ref 0.0–0.3)
Total Protein: 6.7 g/dL (ref 6.0–8.3)

## 2018-10-03 LAB — CBC WITH DIFFERENTIAL/PLATELET
Basophils Absolute: 0.1 10*3/uL (ref 0.0–0.1)
Basophils Relative: 1.2 % (ref 0.0–3.0)
EOS PCT: 2 % (ref 0.0–5.0)
Eosinophils Absolute: 0.1 10*3/uL (ref 0.0–0.7)
HEMATOCRIT: 39.6 % (ref 39.0–52.0)
HEMOGLOBIN: 13.1 g/dL (ref 13.0–17.0)
LYMPHS PCT: 11.6 % — AB (ref 12.0–46.0)
Lymphs Abs: 0.7 10*3/uL (ref 0.7–4.0)
MCHC: 33 g/dL (ref 30.0–36.0)
MCV: 91.3 fl (ref 78.0–100.0)
MONO ABS: 0.8 10*3/uL (ref 0.1–1.0)
MONOS PCT: 13.2 % — AB (ref 3.0–12.0)
Neutro Abs: 4.2 10*3/uL (ref 1.4–7.7)
Neutrophils Relative %: 72 % (ref 43.0–77.0)
Platelets: 277 10*3/uL (ref 150.0–400.0)
RBC: 4.34 Mil/uL (ref 4.22–5.81)
RDW: 13 % (ref 11.5–15.5)
WBC: 5.9 10*3/uL (ref 4.0–10.5)

## 2018-10-03 LAB — PSA: PSA: 1.44 ng/mL (ref 0.10–4.00)

## 2018-10-03 LAB — TSH: TSH: 1.81 u[IU]/mL (ref 0.35–4.50)

## 2018-10-03 LAB — HEMOGLOBIN A1C: HEMOGLOBIN A1C: 8 % — AB (ref 4.6–6.5)

## 2018-10-03 MED ORDER — HYDROCODONE-ACETAMINOPHEN 5-325 MG PO TABS: 1.0000 | ORAL_TABLET | ORAL | 0 refills | Status: DC | PRN

## 2018-10-03 NOTE — Progress Notes (Signed)
Subjective:    Patient ID: Robert Huang, male    DOB: June 05, 1961, 58 y.o.   MRN: [id number]  HPI Here for a well exam and to ask about some issues. He has been under a lot of stress over the past 3 months, and hs thinks this has affected his health. He is leaving his current job and will start a new job in a few weeks. He has also moved back into his house recently (they had to live elsewhere while they renovated the house), and they are still unpacking. He has had frequent upper abdominal pains in the past few weeks. Sometimes these are helped by taking Maalox and sometimes not. He denies nausea or vomiting, but his appetite is very decreased. He has lost 23 lbs in the past 3 months without trying. His BMs are regular. He admits to drinking a lot of alcohol (at least a bottle of wine a day) for years, but he has recently cut back to a beer or two a day. His BP is stable. His glucoses at home run in the 200s. He has never had a colonoscopy even though I have referred him for this numerous times.    Review of Systems  Constitutional: Positive for appetite change and unexpected weight change.  HENT: Negative.   Eyes: Negative.   Respiratory: Negative.   Cardiovascular: Negative.   Gastrointestinal: Positive for abdominal pain.  Genitourinary: Negative.   Musculoskeletal: Negative.   Skin: Negative.   Neurological: Negative.   Psychiatric/Behavioral: Negative.        Objective:   Physical Exam Constitutional:      General: He is not in acute distress.    Appearance: He is well-developed. He is not diaphoretic.  HENT:     Head: Normocephalic and atraumatic.     Right Ear: External ear normal.     Left Ear: External ear normal.     Nose: Nose normal.     Mouth/Throat:     Pharynx: No oropharyngeal exudate.  Eyes:     General: No scleral icterus.       Right eye: No discharge.        Left eye: No discharge.     Conjunctiva/sclera: Conjunctivae normal.     Pupils: Pupils are  equal, round, and reactive to light.  Neck:     Musculoskeletal: Neck supple.     Thyroid: No thyromegaly.     Vascular: No JVD.     Trachea: No tracheal deviation.  Cardiovascular:     Rate and Rhythm: Normal rate and regular rhythm.     Heart sounds: Normal heart sounds. No murmur. No friction rub. No gallop.   Pulmonary:     Effort: Pulmonary effort is normal. No respiratory distress.     Breath sounds: Normal breath sounds. No wheezing or rales.  Chest:     Chest wall: No tenderness.  Abdominal:     General: Bowel sounds are normal. There is no distension.     Palpations: Abdomen is soft. There is no mass.     Tenderness: There is no abdominal tenderness. There is no guarding or rebound.  Genitourinary:    Penis: Normal. No tenderness.      Prostate: Normal.     Rectum: Normal. Guaiac result negative.  Musculoskeletal: Normal range of motion.        General: No tenderness.  Lymphadenopathy:     Cervical: No cervical adenopathy.  Skin:    General: Skin is warm and  dry.     Coloration: Skin is not pale.     Findings: No erythema or rash.  Neurological:     Mental Status: He is alert and oriented to person, place, and time.     Cranial Nerves: No cranial nerve deficit.     Motor: No abnormal muscle tone.     Coordination: Coordination normal.     Deep Tendon Reflexes: Reflexes are normal and symmetric. Reflexes normal.  Psychiatric:        Behavior: Behavior normal.        Thought Content: Thought content normal.        Judgment: Judgment normal.           Assessment & Plan:  Well exam. We discussed diet and exercise. Get fasting labs today. His diabetes seems to be poorly controlled so we will check an A1c. The abdominal pain may be due to duodenitis so he will try Prilosec daily. The combination of abdominal pain and unintended weight loss, however, worries me so we will set up a contrasted CT of the abdomen and pelvis.  Gershon Crane, MD

## 2018-10-08 ENCOUNTER — Encounter: Payer: Self-pay | Admitting: Family Medicine

## 2018-10-08 DIAGNOSIS — E7800 Pure hypercholesterolemia, unspecified: Secondary | ICD-10-CM

## 2018-10-09 MED ORDER — METOPROLOL SUCCINATE ER 100 MG PO TB24: 100.0000 mg | ORAL_TABLET | Freq: Two times a day (BID) | ORAL | 3 refills | Status: DC

## 2018-10-09 MED ORDER — EZETIMIBE 10 MG PO TABS: 10.0000 mg | ORAL_TABLET | Freq: Every day | ORAL | 3 refills | Status: DC

## 2018-10-09 MED ORDER — METFORMIN HCL 1000 MG PO TABS: ORAL_TABLET | ORAL | 3 refills | Status: DC

## 2018-10-09 MED ORDER — ROSUVASTATIN CALCIUM 40 MG PO TABS: 40.0000 mg | ORAL_TABLET | Freq: Every day | ORAL | 3 refills | Status: DC

## 2018-10-09 MED ORDER — AMLODIPINE BESYLATE 10 MG PO TABS: 10.0000 mg | ORAL_TABLET | Freq: Every day | ORAL | 3 refills | Status: DC

## 2018-10-09 MED ORDER — FENOFIBRATE 160 MG PO TABS: 160.0000 mg | ORAL_TABLET | Freq: Every day | ORAL | 3 refills | Status: DC

## 2018-10-09 MED ORDER — LISINOPRIL-HYDROCHLOROTHIAZIDE 20-12.5 MG PO TABS: 1.0000 | ORAL_TABLET | Freq: Two times a day (BID) | ORAL | 3 refills | Status: DC

## 2018-10-10 ENCOUNTER — Encounter: Payer: Self-pay | Admitting: *Deleted

## 2018-10-13 MED ORDER — SITAGLIPTIN PHOSPHATE 100 MG PO TABS: 100.0000 mg | ORAL_TABLET | Freq: Every day | ORAL | 3 refills | Status: DC

## 2018-11-09 ENCOUNTER — Other Ambulatory Visit: Payer: Self-pay | Admitting: Family Medicine

## 2018-11-21 ENCOUNTER — Encounter: Payer: Self-pay | Admitting: Family Medicine

## 2018-11-24 NOTE — Telephone Encounter (Signed)
Dr. Clent Ridges please advise. The pt is requesting that the appt be pushed out until the insurance can be in place for him.

## 2018-11-24 NOTE — Telephone Encounter (Signed)
We can call in some antibiotics but for any more narcotic medications he will need a PMV (we can do a Webex of course). Call in Keflex 500 mg TID for 10 days

## 2018-11-25 MED ORDER — CEPHALEXIN 500 MG PO CAPS: 500.0000 mg | ORAL_CAPSULE | Freq: Three times a day (TID) | ORAL | 0 refills | Status: DC

## 2018-12-02 ENCOUNTER — Ambulatory Visit (INDEPENDENT_AMBULATORY_CARE_PROVIDER_SITE_OTHER): Payer: 59 | Admitting: Family Medicine

## 2018-12-02 ENCOUNTER — Other Ambulatory Visit: Payer: Self-pay

## 2018-12-02 ENCOUNTER — Encounter: Payer: Self-pay | Admitting: Family Medicine

## 2018-12-02 DIAGNOSIS — Z87442 Personal history of urinary calculi: Secondary | ICD-10-CM | POA: Diagnosis not present

## 2018-12-02 DIAGNOSIS — G8929 Other chronic pain: Secondary | ICD-10-CM | POA: Insufficient documentation

## 2018-12-02 DIAGNOSIS — F1190 Opioid use, unspecified, uncomplicated: Secondary | ICD-10-CM

## 2018-12-02 DIAGNOSIS — M25511 Pain in right shoulder: Secondary | ICD-10-CM | POA: Diagnosis not present

## 2018-12-02 NOTE — Progress Notes (Signed)
Subjective:    Patient ID: Robert Huang, male    DOB: August 29, 1960, 58 y.o.   MRN: [id number]  HPI Virtual Visit via Video Note  I connected with the patient on 12/02/18 at  3:00 PM EDT by a video enabled telemedicine application and verified that I am speaking with the correct person using two identifiers.  Location patient: home Location provider:work or home office Persons participating in the virtual visit: patient, provider  I discussed the limitations of evaluation and management by telemedicine and the availability of in person appointments. The patient expressed understanding and agreed to proceed.   HPI: He is for his first pain management visit. He takes Norco as needed for pain in the right shoulder (he has a chronic rotator cuff injury) and for pain from kidney stones that he passes periodically. Since this is a virtual visit, he will come by the clinic tomorrow to sign a pain management contract and to provide a UDS sample. Indication for chronic opioid: shoulder pain and kidney stones  Medication and dose: Norco 5-325 # pills per month: 120 Last UDS date: 12-03-18 Opioid Treatment Agreement signed (Y/N): 12-03-18 Opioid Treatment Agreement last reviewed with patient:  12-03-18 [facility] reviewed this encounter (include red flags):  12-03-18    ROS: See pertinent positives and negatives per HPI.  Past Medical History:  Diagnosis Date  . Anxiety   . CAD (coronary artery disease)    sees Dr. Jens Somrenshaw, normal Stress test 07-16-12   . Diabetes mellitus   . DJD (degenerative joint disease)   . Gout   . Hyperlipidemia   . Hypertension   . Hypothyroid   . Osteoarthritis   . Renal calculus    hx  . Umbilical hernia     Past Surgical History:  Procedure Laterality Date  . AMPUTATION  07/17/2012   Procedure: AMPUTATION RAY;  Surgeon: Toni ArthursJohn Hewitt, MD;  Location[facility] OR;  Service: Orthopedics;  Laterality: Right;  right hallux amputation (1st ray resection )  . CORONARY  ANGIOPLASTY WITH STENT PLACEMENT    . FINGER SURGERY     left index finger  . I & D Toe  05/19/2012   rt great toe  . I&D EXTREMITY  05/19/2012   Procedure: IRRIGATION AND DEBRIDEMENT EXTREMITY;  Surgeon: Senaida LangeKevin M Supple, MD;  Location: [facility] OR;  Service: Orthopedics;  Laterality: Right;  Right Great toe  . TONSILLECTOMY      Family History  Problem Relation Age of Onset  . Heart attack Mother   . Arthritis Other   . Coronary artery disease Other   . Diabetes Other   . Hypertension Other   . Prostate cancer Other   . Stroke Other      Current Outpatient Medications:  .  ALPRAZolam (XANAX) 1 MG tablet, Take 1 tablet (1 mg total) by mouth 3 (three) times daily as needed for anxiety., Disp: 270 tablet, Rfl: 1 .  amLODipine (NORVASC) 10 MG tablet, Take 1 tablet (10 mg total) by mouth daily. <PLEASE MAKE APPOINTMENT FOR REFILLS>, Disp: 90 tablet, Rfl: 3 .  Aspirin-Acetaminophen-Caffeine (GOODYS EXTRA STRENGTH) 500-325-65 MG PACK, Take 1 Package by mouth daily. (Patient taking differently: Take 1 Package by mouth daily. Take additionally as needed during the day), Disp: 90 each, Rfl: 3 .  cephALEXin (KEFLEX) 500 MG capsule, Take 1 capsule (500 mg total) by mouth 3 (three) times daily., Disp: 30 capsule, Rfl: 0 .  cloNIDine (CATAPRES) 0.3 MG tablet, TAKE 1 TABLET (0.3 MG TOTAL)  BY MOUTH 2 (TWO) TIMES DAILY. NEED OV., Disp: 60 tablet, Rfl: 11 .  colchicine 0.6 MG tablet, Take 1 tablet (0.6 mg total) by mouth every 6 (six) hours as needed (gout)., Disp: 60 tablet, Rfl: 5 .  erythromycin ophthalmic ointment, Place a 1/2 inch ribbon of ointment into the lower eyelid., Disp: 1 g, Rfl: 0 .  ezetimibe (ZETIA) 10 MG tablet, Take 1 tablet (10 mg total) by mouth daily., Disp: 90 tablet, Rfl: 3 .  fenofibrate 160 MG tablet, Take 1 tablet (160 mg total) by mouth daily. NEED OV., Disp: 90 tablet, Rfl: 3 .  glipiZIDE (GLUCOTROL) 10 MG tablet, TAKE ONE TABLET BY MOUTH TWICE A DAY BEFORE A MEAL * NEED OFFICE  VISIT FOR FUTURE REFILLS * (Patient taking differently: Take 10 mg by mouth daily before breakfast. ), Disp: 180 tablet, Rfl: 3 .  glucose blood (ONE TOUCH ULTRA TEST) test strip, 1 each by Other route daily. Use as instructed, Disp: 100 each, Rfl: 3 .  HYDROcodone-acetaminophen (NORCO) 5-325 MG tablet, Take 1 tablet by mouth every 4 (four) hours as needed for moderate pain., Disp: 30 tablet, Rfl: 0 .  indomethacin (INDOCIN SR) 75 MG CR capsule, Take 1 capsule (75 mg total) by mouth 2 (two) times daily with a meal. (Patient taking differently: Take 75 mg by mouth as needed. ), Disp: 180 capsule, Rfl: 3 .  levothyroxine (SYNTHROID, LEVOTHROID) 112 MCG tablet, TAKE 1 TABLET BY MOUTH EVERY DAY, Disp: 30 tablet, Rfl: 11 .  lisinopril-hydrochlorothiazide (PRINZIDE,ZESTORETIC) 20-12.5 MG tablet, Take 1 tablet by mouth 2 (two) times daily. 10AM AND 5PM, Disp: 180 tablet, Rfl: 3 .  metFORMIN (GLUCOPHAGE) 1000 MG tablet, TAKE 1 TABLET BY MOUTH TWO TIMES A DAY WITH A MEAL, Disp: 180 tablet, Rfl: 3 .  metoprolol succinate (TOPROL-XL) 100 MG 24 hr tablet, Take 1 tablet (100 mg total) by mouth 2 (two) times daily., Disp: 180 tablet, Rfl: 3 .  nitroGLYCERIN (NITROSTAT) 0.4 MG SL tablet, Place 1 tablet (0.4 mg total) under the tongue every 5 (five) minutes as needed. For chest pain., Disp: 25 tablet, Rfl: 5 .  rosuvastatin (CRESTOR) 40 MG tablet, Take 1 tablet (40 mg total) by mouth daily. <PLEASE MAKE APPOINTMENT FOR REFILLS>, Disp: 90 tablet, Rfl: 3 .  zolpidem (AMBIEN) 10 MG tablet, TAKE 1 TABLET BY MOUTH EVERYDAY AT BEDTIME, Disp: 90 tablet, Rfl: 1 .  sitaGLIPtin (JANUVIA) 100 MG tablet, Take 1 tablet (100 mg total) by mouth daily. (Patient not taking: Reported on 12/02/2018), Disp: 90 tablet, Rfl: 3  EXAM:  VITALS per patient if applicable:  GENERAL: alert, oriented, appears well and in no acute distress  HEENT: atraumatic, conjunttiva clear, no obvious abnormalities on inspection of external nose and ears   NECK: normal movements of the head and neck  LUNGS: on inspection no signs of respiratory distress, breathing rate appears normal, no obvious gross SOB, gasping or wheezing  CV: no obvious cyanosis  MS: moves all visible extremities without noticeable abnormality  PSYCH/NEURO: pleasant and cooperative, no obvious depression or anxiety, speech and thought processing grossly intact  ASSESSMENT AND PLAN: Pain management, meds were refilled. Gershon Crane, MD  Discussed the following assessment and plan:  Chronic narcotic use - Plan: Pain Mgmt, Profile 8 w/Conf, U     I discussed the assessment and treatment plan with the patient. The patient was provided an opportunity to ask questions and all were answered. The patient agreed with the plan and demonstrated an understanding of the  instructions.   The patient was advised to call back or seek an in-person evaluation if the symptoms worsen or if the condition fails to improve as anticipated.     Review of Systems     Objective:   Physical Exam        Assessment & Plan:

## 2018-12-03 MED ORDER — HYDROCODONE-ACETAMINOPHEN 5-325 MG PO TABS: 1.0000 | ORAL_TABLET | Freq: Four times a day (QID) | ORAL | 0 refills | Status: AC | PRN

## 2018-12-03 NOTE — Addendum Note (Signed)
Addended by: Conrad Lorton on: 12/03/2018 02:31 PM   Modules accepted: Orders

## 2018-12-03 NOTE — Addendum Note (Signed)
Addended by: Gershon Crane A on: 12/03/2018 02:58 PM   Modules accepted: Orders

## 2018-12-06 LAB — PAIN MGMT, PROFILE 8 W/CONF, U
6 Acetylmorphine: NEGATIVE ng/mL (ref <10)
Alcohol Metabolites: POSITIVE ng/mL — AB (ref <500)
Alphahydroxyalprazolam: 179 ng/mL — ABNORMAL HIGH (ref <25)
Alphahydroxymidazolam: NEGATIVE ng/mL (ref <50)
Alphahydroxytriazolam: NEGATIVE ng/mL (ref <50)
Aminoclonazepam: NEGATIVE ng/mL (ref <25)
Amphetamines: NEGATIVE ng/mL (ref <500)
Benzodiazepines: POSITIVE ng/mL — AB (ref <100)
Buprenorphine, Urine: NEGATIVE ng/mL (ref <5)
Cocaine Metabolite: NEGATIVE ng/mL (ref <150)
Codeine: NEGATIVE ng/mL (ref <50)
Creatinine: 119.8 mg/dL
Ethyl Glucuronide (ETG): >300000 ng/mL — ABNORMAL HIGH (ref <500)
Ethyl Sulfate (ETS): >40000 ng/mL — ABNORMAL HIGH (ref <100)
Hydrocodone: 244 ng/mL — ABNORMAL HIGH (ref <50)
Hydromorphone: 84 ng/mL — ABNORMAL HIGH (ref <50)
Hydroxyethylflurazepam: NEGATIVE ng/mL (ref <50)
Lorazepam: NEGATIVE ng/mL (ref <50)
MDA: NEGATIVE ng/mL (ref <200)
MDMA: NEGATIVE ng/mL (ref <200)
MDMA: NEGATIVE ng/mL (ref <500)
Marijuana Metabolite: NEGATIVE ng/mL (ref <20)
Morphine: NEGATIVE ng/mL (ref <50)
Nordiazepam: NEGATIVE ng/mL (ref <50)
Norhydrocodone: 197 ng/mL — ABNORMAL HIGH (ref <50)
Opiates: POSITIVE ng/mL — AB (ref <100)
Oxazepam: NEGATIVE ng/mL (ref <50)
Oxidant: NEGATIVE ug/mL (ref <200)
Oxycodone: NEGATIVE ng/mL (ref <100)
Temazepam: NEGATIVE ng/mL (ref <50)
pH: 5.76 (ref 4.5–9.0)

## 2019-01-14 ENCOUNTER — Encounter: Payer: Self-pay | Admitting: Internal Medicine

## 2019-01-14 ENCOUNTER — Ambulatory Visit (INDEPENDENT_AMBULATORY_CARE_PROVIDER_SITE_OTHER): Payer: 59 | Admitting: Internal Medicine

## 2019-01-14 ENCOUNTER — Other Ambulatory Visit: Payer: Self-pay

## 2019-01-14 DIAGNOSIS — S90822A Blister (nonthermal), left foot, initial encounter: Secondary | ICD-10-CM

## 2019-01-14 DIAGNOSIS — E118 Type 2 diabetes mellitus with unspecified complications: Secondary | ICD-10-CM | POA: Diagnosis not present

## 2019-01-14 MED ORDER — CEPHALEXIN 500 MG PO CAPS: 500.0000 mg | ORAL_CAPSULE | Freq: Three times a day (TID) | ORAL | 0 refills | Status: DC

## 2019-01-14 NOTE — Progress Notes (Signed)
Virtual Visit via Video Note  I connected with@ on 01/14/19 at  3:00 PM EDT by a video enabled telemedicine application and verified that I am speaking with the correct person using two identifiers. Location patient: home Location provider:work office Persons participating in the virtual visit: patient, provider Patient camera wouldn't work  So had to perform the visit  With phone audio  WIth national recommendations  regarding COVID 19 pandemic   video visit is advised over in office visit for this patient.  Patient aware  of the limitations of evaluation and management by telemedicine and  availability of in person appointments. and agreed to proceed.   HPI: Robert Huang presents for video visit  sda    PCP NA  2 weeks ago had  Sore area left foot not felt to be  Sore pressure area   That has progressed and had bloody blister he popped after fishing tip wearing boots that were uncomfortable  Covering and topical antibiotic    No fever    Had area  In March that was a lot worse  And better with care and  antibiotic  Has diabetes and  Toe amputation in past   Dr Victorino Dike .  Doesn't check feet every day    At this time and no reg foot care   . But has uneven step cause of the amputation of great toe  . Is aware .  No fever  No pus or  Ulcer deepening wound  Noted .  Asks if can have antibiotic  If getting infected     bgfs doing  Better since last check  ROS: See pertinent positives and negatives per HPI.  Past Medical History:  Diagnosis Date  . Anxiety   . CAD (coronary artery disease)    sees Dr. [name], normal Stress test 07-16-12   . Diabetes mellitus   . DJD (degenerative joint disease)   . Gout   . Hyperlipidemia   . Hypertension   . Hypothyroid   . Osteoarthritis   . Renal calculus    hx  . Umbilical hernia     Past Surgical History:  Procedure Laterality Date  . AMPUTATION  07/17/2012   Procedure: AMPUTATION RAY;  Surgeon: [name], MD;  Location: [facility] OR;   Service: Orthopedics;  Laterality: Right;  right hallux amputation (1st ray resection )  . CORONARY ANGIOPLASTY WITH STENT PLACEMENT    . FINGER SURGERY     left index finger  . I & D Toe  05/19/2012   rt great toe  . I&D EXTREMITY  05/19/2012   Procedure: IRRIGATION AND DEBRIDEMENT EXTREMITY;  Surgeon: [name], MD;  Location: [facility] OR;  Service: Orthopedics;  Laterality: Right;  Right Great toe  . TONSILLECTOMY      Family History  Problem Relation Age of Onset  . Heart attack Mother   . Arthritis Other   . Coronary artery disease Other   . Diabetes Other   . Hypertension Other   . Prostate cancer Other   . Stroke Other     Social History   Tobacco Use  . Smoking status: Never Smoker  . Smokeless tobacco: Current User    Types: Chew  . Tobacco comment: occ  Substance Use Topics  . Alcohol use: Yes    Alcohol/week: 0.0 standard drinks    Comment: weekends  . Drug use: No      Current Outpatient Medications:  .  ALPRAZolam (XANAX) 1 MG tablet,  Take 1 tablet (1 mg total) by mouth 3 (three) times daily as needed for anxiety., Disp: 270 tablet, Rfl: 1 .  amLODipine (NORVASC) 10 MG tablet, Take 1 tablet (10 mg total) by mouth daily. <PLEASE MAKE APPOINTMENT FOR REFILLS>, Disp: 90 tablet, Rfl: 3 .  Aspirin-Acetaminophen-Caffeine (GOODYS EXTRA STRENGTH) 500-325-65 MG PACK, Take 1 Package by mouth daily. (Patient taking differently: Take 1 Package by mouth daily. Take additionally as needed during the day), Disp: 90 each, Rfl: 3 .  cloNIDine (CATAPRES) 0.3 MG tablet, TAKE 1 TABLET (0.3 MG TOTAL) BY MOUTH 2 (TWO) TIMES DAILY. NEED OV., Disp: 60 tablet, Rfl: 11 .  colchicine 0.6 MG tablet, Take 1 tablet (0.6 mg total) by mouth every 6 (six) hours as needed (gout)., Disp: 60 tablet, Rfl: 5 .  fenofibrate 160 MG tablet, Take 1 tablet (160 mg total) by mouth daily. NEED OV., Disp: 90 tablet, Rfl: 3 .  glipiZIDE (GLUCOTROL) 10 MG tablet, TAKE ONE TABLET BY MOUTH TWICE A DAY BEFORE A  MEAL * NEED OFFICE VISIT FOR FUTURE REFILLS * (Patient taking differently: Take 10 mg by mouth daily before breakfast. ), Disp: 180 tablet, Rfl: 3 .  glucose blood (ONE TOUCH ULTRA TEST) test strip, 1 each by Other route daily. Use as instructed, Disp: 100 each, Rfl: 3 .  indomethacin (INDOCIN SR) 75 MG CR capsule, Take 1 capsule (75 mg total) by mouth 2 (two) times daily with a meal. (Patient taking differently: Take 75 mg by mouth as needed. ), Disp: 180 capsule, Rfl: 3 .  levothyroxine (SYNTHROID, LEVOTHROID) 112 MCG tablet, TAKE 1 TABLET BY MOUTH EVERY DAY, Disp: 30 tablet, Rfl: 11 .  lisinopril-hydrochlorothiazide (PRINZIDE,ZESTORETIC) 20-12.5 MG tablet, Take 1 tablet by mouth 2 (two) times daily. 10AM AND 5PM, Disp: 180 tablet, Rfl: 3 .  metFORMIN (GLUCOPHAGE) 1000 MG tablet, TAKE 1 TABLET BY MOUTH TWO TIMES A DAY WITH A MEAL, Disp: 180 tablet, Rfl: 3 .  metoprolol succinate (TOPROL-XL) 100 MG 24 hr tablet, Take 1 tablet (100 mg total) by mouth 2 (two) times daily., Disp: 180 tablet, Rfl: 3 .  nitroGLYCERIN (NITROSTAT) 0.4 MG SL tablet, Place 1 tablet (0.4 mg total) under the tongue every 5 (five) minutes as needed. For chest pain., Disp: 25 tablet, Rfl: 5 .  rosuvastatin (CRESTOR) 40 MG tablet, Take 1 tablet (40 mg total) by mouth daily. <PLEASE MAKE APPOINTMENT FOR REFILLS>, Disp: 90 tablet, Rfl: 3 .  zolpidem (AMBIEN) 10 MG tablet, TAKE 1 TABLET BY MOUTH EVERYDAY AT BEDTIME, Disp: 90 tablet, Rfl: 1 .  cephALEXin (KEFLEX) 500 MG capsule, Take 1 capsule (500 mg total) by mouth 3 (three) times daily., Disp: 21 capsule, Rfl: 0 .  ezetimibe (ZETIA) 10 MG tablet, Take 1 tablet (10 mg total) by mouth daily., Disp: 90 tablet, Rfl: 3 .  sitaGLIPtin (JANUVIA) 100 MG tablet, Take 1 tablet (100 mg total) by mouth daily. (Patient not taking: Reported on 12/02/2018), Disp: 90 tablet, Rfl: 3  EXAM: BP Readings from Last 3 Encounters:  10/03/18 110/72  06/04/18 (!) 114/59  05/27/18 122/68    VITALS per  patient if applicable: NA  GENERAL: alert, oriented, appears well and in no acute distress Unable to view foot cause of camera was blocked   PSYCH/NEURO: pleasant and cooperative, no obvious depression or anxiety, speech and thought processing grossly intact Lab Results  Component Value Date   WBC 5.9 10/03/2018   HGB 13.1 10/03/2018   HCT 39.6 10/03/2018   PLT 277.0 10/03/2018  GLUCOSE 148 (H) 10/03/2018   CHOL 133 10/03/2018   TRIG 146.0 10/03/2018   HDL 43.50 10/03/2018   LDLDIRECT 129.0 12/03/2017   LDLCALC 61 10/03/2018   ALT 20 10/03/2018   AST 22 10/03/2018   NA 134 (L) 10/03/2018   K 3.7 10/03/2018   CL 95 (L) 10/03/2018   CREATININE 0.96 10/03/2018   BUN 11 10/03/2018   CO2 30 10/03/2018   TSH 1.81 10/03/2018   PSA 1.44 10/03/2018   INR 1.0 10/28/2008   HGBA1C 8.0 (H) 10/03/2018   MICROALBUR 1.7 11/05/2014    ASSESSMENT AND PLAN: Failed video  Discussed the following assessment and plan:  Blister of  foot, initial encounter - left?  high risk for complicaitons   Diabetes mellitus with complication (HCC) patient request antibiotic  although doesn't sound infected at this time   Take pix and will  send in antibiotic medication in case gets   Infected looking over the holiday weekend    . Local care  Advised do daily foot checks  Consider  Help  With foot wear   His gait certainly can be off with his prev amputation of r great toe and other issues  Counseled.  Risk benefit of medication discussed.   Expectant management and discussion of plan and treatment with opportunity to ask questions and all were answered. The patient agreed with the plan and demonstrated an understanding of the instructions.   Advised to call back or seek an in-person evaluation if worsening  or having  further concerns .  Make appt  With Dr Lysle Morales  In office    to check foot   I provided 21 minutes of non-face-to-face time during this encounter.   Berniece Andreas, MD

## 2019-01-16 ENCOUNTER — Other Ambulatory Visit: Payer: Self-pay

## 2019-01-16 NOTE — Telephone Encounter (Signed)
Pt would like to have a work note for his visit on Wednesday stating that he needs to stay off his feet until Tuesday.  He is a Dr. Clent Ridges pt but in his absence he was seen by Dr. Fabian Sharp.

## 2019-01-16 NOTE — Telephone Encounter (Signed)
Please get him a work note to be  Off his feet  Until Tuesday .

## 2019-02-13 ENCOUNTER — Encounter: Payer: Self-pay | Admitting: Family Medicine

## 2019-02-13 NOTE — Telephone Encounter (Signed)
He will need a PMV for this  

## 2019-02-13 NOTE — Telephone Encounter (Signed)
Dr. Fry please advise. Thanks  

## 2019-02-16 ENCOUNTER — Ambulatory Visit (INDEPENDENT_AMBULATORY_CARE_PROVIDER_SITE_OTHER): Payer: 59 | Admitting: Family Medicine

## 2019-02-16 ENCOUNTER — Encounter: Payer: Self-pay | Admitting: Family Medicine

## 2019-02-16 ENCOUNTER — Other Ambulatory Visit: Payer: Self-pay

## 2019-02-16 DIAGNOSIS — M25511 Pain in right shoulder: Secondary | ICD-10-CM

## 2019-02-16 DIAGNOSIS — G8929 Other chronic pain: Secondary | ICD-10-CM | POA: Diagnosis not present

## 2019-02-16 DIAGNOSIS — F1190 Opioid use, unspecified, uncomplicated: Secondary | ICD-10-CM | POA: Diagnosis not present

## 2019-02-16 NOTE — Progress Notes (Signed)
Subjective:    Patient ID: Robert Huang, male    DOB: November 29, 1960, 58 y.o.   MRN: [id number]  HPI Virtual Visit via Video Note  I connected with the patient on 02/16/19 at  4:15 PM EDT by a video enabled telemedicine application and verified that I am speaking with the correct person using two identifiers.  Location patient: home Location provider:work or home office Persons participating in the virtual visit: patient, provider  I discussed the limitations of evaluation and management by telemedicine and the availability of in person appointments. The patient expressed understanding and agreed to proceed.   HPI: Here for pain management. He is doing fairly well, though the right shoulder gives him a hard time.  Indication for chronic opioid: shoulder pain and frequent kidney stones Medication and dose: Norco 5-325 # pills per month: 120 Last UDS date: 12-03-18 Opioid Treatment Agreement signed (Y/N): 12-10-18 Opioid Treatment Agreement last reviewed with patient:  02-16-19 [facility] reviewed this encounter (include red flags):  02-16-19    ROS: See pertinent positives and negatives per HPI.  Past Medical History:  Diagnosis Date  . Anxiety   . CAD (coronary artery disease)    sees Dr. Stanford Breed, normal Stress test 07-16-12   . Diabetes mellitus   . DJD (degenerative joint disease)   . Gout   . Hyperlipidemia   . Hypertension   . Hypothyroid   . Osteoarthritis   . Renal calculus    hx  . Umbilical hernia     Past Surgical History:  Procedure Laterality Date  . AMPUTATION  07/17/2012   Procedure: AMPUTATION RAY;  Surgeon: Wylene Simmer, MD;  Location: [place];  Service: Orthopedics;  Laterality: Right;  right hallux amputation (1st ray resection )  . CORONARY ANGIOPLASTY WITH STENT PLACEMENT    . FINGER SURGERY     left index finger  . I & D Toe  05/19/2012   rt great toe  . I&D EXTREMITY  05/19/2012   Procedure: IRRIGATION AND DEBRIDEMENT EXTREMITY;  Surgeon: Marin Shutter, MD;  Location: [place];  Service: Orthopedics;  Laterality: Right;  Right Great toe  . TONSILLECTOMY      Family History  Problem Relation Age of Onset  . Heart attack Mother   . Arthritis Other   . Coronary artery disease Other   . Diabetes Other   . Hypertension Other   . Prostate cancer Other   . Stroke Other      Current Outpatient Medications:  .  ALPRAZolam (XANAX) 1 MG tablet, Take 1 tablet (1 mg total) by mouth 3 (three) times daily as needed for anxiety., Disp: 270 tablet, Rfl: 1 .  amLODipine (NORVASC) 10 MG tablet, Take 1 tablet (10 mg total) by mouth daily. <PLEASE MAKE APPOINTMENT FOR REFILLS>, Disp: 90 tablet, Rfl: 3 .  Aspirin-Acetaminophen-Caffeine (GOODYS EXTRA STRENGTH) 500-325-65 MG PACK, Take 1 Package by mouth daily. (Patient taking differently: Take 1 Package by mouth daily. Take additionally as needed during the day), Disp: 90 each, Rfl: 3 .  cephALEXin (KEFLEX) 500 MG capsule, Take 1 capsule (500 mg total) by mouth 3 (three) times daily., Disp: 21 capsule, Rfl: 0 .  cloNIDine (CATAPRES) 0.3 MG tablet, TAKE 1 TABLET (0.3 MG TOTAL) BY MOUTH 2 (TWO) TIMES DAILY. NEED OV., Disp: 60 tablet, Rfl: 11 .  colchicine 0.6 MG tablet, Take 1 tablet (0.6 mg total) by mouth every 6 (six) hours as needed (gout)., Disp: 60 tablet, Rfl: 5 .  fenofibrate  160 MG tablet, Take 1 tablet (160 mg total) by mouth daily. NEED OV., Disp: 90 tablet, Rfl: 3 .  glipiZIDE (GLUCOTROL) 10 MG tablet, TAKE ONE TABLET BY MOUTH TWICE A DAY BEFORE A MEAL * NEED OFFICE VISIT FOR FUTURE REFILLS * (Patient taking differently: Take 10 mg by mouth daily before breakfast. ), Disp: 180 tablet, Rfl: 3 .  glucose blood (ONE TOUCH ULTRA TEST) test strip, 1 each by Other route daily. Use as instructed, Disp: 100 each, Rfl: 3 .  indomethacin (INDOCIN SR) 75 MG CR capsule, Take 1 capsule (75 mg total) by mouth 2 (two) times daily with a meal. (Patient taking differently: Take 75 mg by mouth as needed. ), Disp:  180 capsule, Rfl: 3 .  levothyroxine (SYNTHROID, LEVOTHROID) 112 MCG tablet, TAKE 1 TABLET BY MOUTH EVERY DAY, Disp: 30 tablet, Rfl: 11 .  lisinopril-hydrochlorothiazide (PRINZIDE,ZESTORETIC) 20-12.5 MG tablet, Take 1 tablet by mouth 2 (two) times daily. 10AM AND 5PM, Disp: 180 tablet, Rfl: 3 .  metFORMIN (GLUCOPHAGE) 1000 MG tablet, TAKE 1 TABLET BY MOUTH TWO TIMES A DAY WITH A MEAL, Disp: 180 tablet, Rfl: 3 .  metoprolol succinate (TOPROL-XL) 100 MG 24 hr tablet, Take 1 tablet (100 mg total) by mouth 2 (two) times daily., Disp: 180 tablet, Rfl: 3 .  nitroGLYCERIN (NITROSTAT) 0.4 MG SL tablet, Place 1 tablet (0.4 mg total) under the tongue every 5 (five) minutes as needed. For chest pain., Disp: 25 tablet, Rfl: 5 .  rosuvastatin (CRESTOR) 40 MG tablet, Take 1 tablet (40 mg total) by mouth daily. <PLEASE MAKE APPOINTMENT FOR REFILLS>, Disp: 90 tablet, Rfl: 3 .  sitaGLIPtin (JANUVIA) 100 MG tablet, Take 1 tablet (100 mg total) by mouth daily., Disp: 90 tablet, Rfl: 3 .  zolpidem (AMBIEN) 10 MG tablet, TAKE 1 TABLET BY MOUTH EVERYDAY AT BEDTIME, Disp: 90 tablet, Rfl: 1 .  ezetimibe (ZETIA) 10 MG tablet, Take 1 tablet (10 mg total) by mouth daily., Disp: 90 tablet, Rfl: 3  EXAM:  VITALS per patient if applicable:  GENERAL: alert, oriented, appears well and in no acute distress  HEENT: atraumatic, conjunttiva clear, no obvious abnormalities on inspection of external nose and ears  NECK: normal movements of the head and neck  LUNGS: on inspection no signs of respiratory distress, breathing rate appears normal, no obvious gross SOB, gasping or wheezing  CV: no obvious cyanosis  MS: moves all visible extremities without noticeable abnormality  PSYCH/NEURO: pleasant and cooperative, no obvious depression or anxiety, speech and thought processing grossly intact  ASSESSMENT AND PLAN: Pain management, meds were refilled.  Gershon CraneStephen Fry, MD  Discussed the following assessment and plan:  No  diagnosis found.     I discussed the assessment and treatment plan with the patient. The patient was provided an opportunity to ask questions and all were answered. The patient agreed with the plan and demonstrated an understanding of the instructions.   The patient was advised to call back or seek an in-person evaluation if the symptoms worsen or if the condition fails to improve as anticipated.     Review of Systems     Objective:   Physical Exam        Assessment & Plan:

## 2019-02-18 MED ORDER — HYDROCODONE-ACETAMINOPHEN 5-325 MG PO TABS: 1.0000 | ORAL_TABLET | Freq: Four times a day (QID) | ORAL | 0 refills | Status: DC | PRN

## 2019-02-18 NOTE — Telephone Encounter (Signed)
Dr. Sarajane Jews please advise on the refill of the hydrocodone.  Pt stated that he went to pick up the rx a the pharmacy and they did not have it.  Thanks

## 2019-02-18 NOTE — Telephone Encounter (Signed)
Dr. Henreitta Cea please advise. Thanks

## 2019-02-18 NOTE — Telephone Encounter (Signed)
I apologize, I had forgotten. I just sent the rx in to the [facility] for him

## 2019-02-19 NOTE — Telephone Encounter (Signed)
Actually insurance only covers for testing once a day for a diabetic who is not on insulin. He is stable enough now that once a day is adequate

## 2019-02-26 ENCOUNTER — Telehealth: Payer: Self-pay | Admitting: Infectious Diseases

## 2019-02-26 NOTE — Telephone Encounter (Signed)
COVID-19 Pre-Screening Questions: ° °Do you currently have a fever (>100 °F), chills or unexplained body aches? No  ° °Are you currently experiencing new cough, shortness of breath, sore throat, runny nose? No  °•  °Have you recently travelled outside the state of [place] in the last 14 days? No  °•  °1. Have you been in contact with someone that is currently pending confirmation of Covid19 testing or has been confirmed to have the Covid19 virus?  No  ° °

## 2019-03-02 ENCOUNTER — Encounter (HOSPITAL_BASED_OUTPATIENT_CLINIC_OR_DEPARTMENT_OTHER): Payer: Self-pay

## 2019-03-02 ENCOUNTER — Emergency Department (HOSPITAL_BASED_OUTPATIENT_CLINIC_OR_DEPARTMENT_OTHER): Payer: 59

## 2019-03-02 ENCOUNTER — Inpatient Hospital Stay (HOSPITAL_BASED_OUTPATIENT_CLINIC_OR_DEPARTMENT_OTHER)
Admission: EM | Admit: 2019-03-02 | Discharge: 2019-03-09 | DRG: 617 | Disposition: A | Discharge status: Home Health | Payer: 59 | Attending: Internal Medicine | Admitting: Internal Medicine

## 2019-03-02 ENCOUNTER — Encounter: Payer: Self-pay | Admitting: Infectious Diseases

## 2019-03-02 ENCOUNTER — Ambulatory Visit (INDEPENDENT_AMBULATORY_CARE_PROVIDER_SITE_OTHER): Payer: 59 | Admitting: Infectious Diseases

## 2019-03-02 ENCOUNTER — Other Ambulatory Visit: Payer: Self-pay

## 2019-03-02 VITALS — BP 114/70 | HR 64 | Temp 97.8°F | Ht 72.0 in

## 2019-03-02 DIAGNOSIS — E785 Hyperlipidemia, unspecified: Secondary | ICD-10-CM | POA: Diagnosis not present

## 2019-03-02 DIAGNOSIS — E11621 Type 2 diabetes mellitus with foot ulcer: Secondary | ICD-10-CM | POA: Diagnosis present

## 2019-03-02 DIAGNOSIS — M869 Osteomyelitis, unspecified: Secondary | ICD-10-CM | POA: Diagnosis present

## 2019-03-02 DIAGNOSIS — Z79899 Other long term (current) drug therapy: Secondary | ICD-10-CM | POA: Diagnosis not present

## 2019-03-02 DIAGNOSIS — F17220 Nicotine dependence, chewing tobacco, uncomplicated: Secondary | ICD-10-CM | POA: Diagnosis present

## 2019-03-02 DIAGNOSIS — L03116 Cellulitis of left lower limb: Secondary | ICD-10-CM | POA: Diagnosis present

## 2019-03-02 DIAGNOSIS — I10 Essential (primary) hypertension: Secondary | ICD-10-CM | POA: Diagnosis not present

## 2019-03-02 DIAGNOSIS — L97529 Non-pressure chronic ulcer of other part of left foot with unspecified severity: Secondary | ICD-10-CM | POA: Diagnosis present

## 2019-03-02 DIAGNOSIS — I2510 Atherosclerotic heart disease of native coronary artery without angina pectoris: Secondary | ICD-10-CM | POA: Diagnosis present

## 2019-03-02 DIAGNOSIS — E118 Type 2 diabetes mellitus with unspecified complications: Secondary | ICD-10-CM | POA: Diagnosis present

## 2019-03-02 DIAGNOSIS — Z833 Family history of diabetes mellitus: Secondary | ICD-10-CM

## 2019-03-02 DIAGNOSIS — F411 Generalized anxiety disorder: Secondary | ICD-10-CM | POA: Diagnosis present

## 2019-03-02 DIAGNOSIS — E1169 Type 2 diabetes mellitus with other specified complication: Secondary | ICD-10-CM | POA: Diagnosis not present

## 2019-03-02 DIAGNOSIS — M109 Gout, unspecified: Secondary | ICD-10-CM | POA: Diagnosis not present

## 2019-03-02 DIAGNOSIS — L089 Local infection of the skin and subcutaneous tissue, unspecified: Secondary | ICD-10-CM | POA: Diagnosis present

## 2019-03-02 DIAGNOSIS — L97509 Non-pressure chronic ulcer of other part of unspecified foot with unspecified severity: Secondary | ICD-10-CM | POA: Diagnosis present

## 2019-03-02 DIAGNOSIS — Z89411 Acquired absence of right great toe: Secondary | ICD-10-CM | POA: Diagnosis not present

## 2019-03-02 DIAGNOSIS — Z955 Presence of coronary angioplasty implant and graft: Secondary | ICD-10-CM | POA: Diagnosis not present

## 2019-03-02 DIAGNOSIS — Z7984 Long term (current) use of oral hypoglycemic drugs: Secondary | ICD-10-CM

## 2019-03-02 DIAGNOSIS — E039 Hypothyroidism, unspecified: Secondary | ICD-10-CM | POA: Diagnosis not present

## 2019-03-02 DIAGNOSIS — Z79890 Hormone replacement therapy (postmenopausal): Secondary | ICD-10-CM | POA: Diagnosis not present

## 2019-03-02 DIAGNOSIS — E876 Hypokalemia: Secondary | ICD-10-CM | POA: Diagnosis present

## 2019-03-02 DIAGNOSIS — Z1159 Encounter for screening for other viral diseases: Secondary | ICD-10-CM | POA: Diagnosis not present

## 2019-03-02 DIAGNOSIS — R4689 Other symptoms and signs involving appearance and behavior: Secondary | ICD-10-CM | POA: Diagnosis not present

## 2019-03-02 LAB — COMPREHENSIVE METABOLIC PANEL
ALT: 15 U/L (ref 0–44)
AST: 14 U/L — ABNORMAL LOW (ref 15–41)
Albumin: 3.3 g/dL — ABNORMAL LOW (ref 3.5–5.0)
Alkaline Phosphatase: 69 U/L (ref 38–126)
Anion gap: 9 (ref 5–15)
BUN: 15 mg/dL (ref 6–20)
CO2: 20 mmol/L — ABNORMAL LOW (ref 22–32)
Calcium: 8.7 mg/dL — ABNORMAL LOW (ref 8.9–10.3)
Chloride: 106 mmol/L (ref 98–111)
Creatinine, Ser: 0.89 mg/dL (ref 0.61–1.24)
GFR calc Af Amer: >60 mL/min (ref >60)
GFR calc non Af Amer: >60 mL/min (ref >60)
Glucose, Bld: 203 mg/dL — ABNORMAL HIGH (ref 70–99)
Potassium: 3.3 mmol/L — ABNORMAL LOW (ref 3.5–5.1)
Sodium: 135 mmol/L (ref 135–145)
Total Bilirubin: 0.5 mg/dL (ref 0.3–1.2)
Total Protein: 6.9 g/dL (ref 6.5–8.1)

## 2019-03-02 LAB — CBC WITH DIFFERENTIAL/PLATELET
Abs Immature Granulocytes: 0.05 10*3/uL (ref 0.00–0.07)
Basophils Absolute: 0 10*3/uL (ref 0.0–0.1)
Basophils Relative: 0 %
Eosinophils Absolute: 0.1 10*3/uL (ref 0.0–0.5)
Eosinophils Relative: 1 %
HCT: 35.6 % — ABNORMAL LOW (ref 39.0–52.0)
Hemoglobin: 11.6 g/dL — ABNORMAL LOW (ref 13.0–17.0)
Immature Granulocytes: 1 %
Lymphocytes Relative: 4 %
Lymphs Abs: 0.4 10*3/uL — ABNORMAL LOW (ref 0.7–4.0)
MCH: 30.4 pg (ref 26.0–34.0)
MCHC: 32.6 g/dL (ref 30.0–36.0)
MCV: 93.2 fL (ref 80.0–100.0)
Monocytes Absolute: 1 10*3/uL (ref 0.1–1.0)
Monocytes Relative: 9 %
Neutro Abs: 9.4 10*3/uL — ABNORMAL HIGH (ref 1.7–7.7)
Neutrophils Relative %: 85 %
Platelets: 283 10*3/uL (ref 150–400)
RBC: 3.82 MIL/uL — ABNORMAL LOW (ref 4.22–5.81)
RDW: 12.1 % (ref 11.5–15.5)
WBC: 10.9 10*3/uL — ABNORMAL HIGH (ref 4.0–10.5)
nRBC: 0 % (ref 0.0–0.2)

## 2019-03-02 LAB — URINALYSIS, ROUTINE W REFLEX MICROSCOPIC
Bilirubin Urine: NEGATIVE
Glucose, UA: NEGATIVE mg/dL
Hgb urine dipstick: NEGATIVE
Ketones, ur: NEGATIVE mg/dL
Leukocytes,Ua: NEGATIVE
Nitrite: NEGATIVE
Protein, ur: NEGATIVE mg/dL
Specific Gravity, Urine: 1.025 (ref 1.005–1.030)
pH: 5.5 (ref 5.0–8.0)

## 2019-03-02 LAB — LACTIC ACID, PLASMA: Lactic Acid, Venous: 0.9 mmol/L (ref 0.5–1.9)

## 2019-03-02 LAB — SEDIMENTATION RATE: Sed Rate: 50 mm/hr — ABNORMAL HIGH (ref 0–16)

## 2019-03-02 LAB — SARS CORONAVIRUS 2 AG (30 MIN TAT): SARS Coronavirus 2 Ag: NEGATIVE

## 2019-03-02 MED ORDER — INDOMETHACIN ER 75 MG PO CPCR
75.0000 mg | ORAL_CAPSULE | ORAL | Status: DC | PRN
  Filled 2019-03-02: qty 1

## 2019-03-02 MED ORDER — VANCOMYCIN HCL 10 G IV SOLR
2000.0000 mg | Freq: Once | INTRAVENOUS | Status: AC
  Administered 2019-03-03: 2000 mg via INTRAVENOUS
  Filled 2019-03-02 (×2): qty 2000

## 2019-03-02 MED ORDER — FENOFIBRATE 160 MG PO TABS
160.0000 mg | ORAL_TABLET | Freq: Every day | ORAL | Status: DC
  Administered 2019-03-03 – 2019-03-09 (×6): 160 mg via ORAL
  Filled 2019-03-02 (×7): qty 1

## 2019-03-02 MED ORDER — LISINOPRIL-HYDROCHLOROTHIAZIDE 20-12.5 MG PO TABS: 1.0000 | ORAL_TABLET | Freq: Two times a day (BID) | ORAL | Status: DC

## 2019-03-02 MED ORDER — CLONIDINE HCL 0.1 MG PO TABS
0.3000 mg | ORAL_TABLET | Freq: Two times a day (BID) | ORAL | Status: DC
  Administered 2019-03-03 – 2019-03-09 (×12): 0.3 mg via ORAL
  Filled 2019-03-02 (×12): qty 3

## 2019-03-02 MED ORDER — METOPROLOL SUCCINATE ER 50 MG PO TB24
100.0000 mg | ORAL_TABLET | Freq: Two times a day (BID) | ORAL | Status: DC
  Administered 2019-03-03 – 2019-03-06 (×7): 100 mg via ORAL
  Filled 2019-03-02 (×7): qty 2

## 2019-03-02 MED ORDER — NITROGLYCERIN 0.4 MG SL SUBL: 0.4000 mg | SUBLINGUAL_TABLET | SUBLINGUAL | Status: DC | PRN

## 2019-03-02 MED ORDER — LEVOTHYROXINE SODIUM 112 MCG PO TABS
112.0000 ug | ORAL_TABLET | Freq: Every day | ORAL | Status: DC
  Administered 2019-03-03 – 2019-03-09 (×7): 112 ug via ORAL
  Filled 2019-03-02 (×8): qty 1

## 2019-03-02 MED ORDER — EZETIMIBE 10 MG PO TABS
10.0000 mg | ORAL_TABLET | Freq: Every day | ORAL | Status: DC
  Administered 2019-03-03 – 2019-03-08 (×5): 10 mg via ORAL
  Filled 2019-03-02 (×7): qty 1

## 2019-03-02 MED ORDER — SODIUM CHLORIDE 0.9 % IV SOLN
1.0000 g | INTRAVENOUS | Status: DC
  Administered 2019-03-03: 1 g via INTRAVENOUS
  Filled 2019-03-02: qty 10

## 2019-03-02 MED ORDER — AMLODIPINE BESYLATE 10 MG PO TABS
10.0000 mg | ORAL_TABLET | Freq: Every day | ORAL | Status: DC
  Administered 2019-03-03 – 2019-03-09 (×6): 10 mg via ORAL
  Filled 2019-03-02 (×6): qty 1

## 2019-03-02 MED ORDER — ROSUVASTATIN CALCIUM 20 MG PO TABS
40.0000 mg | ORAL_TABLET | Freq: Every day | ORAL | Status: DC
  Administered 2019-03-03 – 2019-03-08 (×7): 40 mg via ORAL
  Filled 2019-03-02 (×6): qty 2
  Filled 2019-03-02: qty 1

## 2019-03-02 MED ORDER — HYDROCODONE-ACETAMINOPHEN 5-325 MG PO TABS
1.0000 | ORAL_TABLET | Freq: Four times a day (QID) | ORAL | Status: DC | PRN
  Administered 2019-03-03 – 2019-03-07 (×5): 1 via ORAL
  Filled 2019-03-02 (×6): qty 1

## 2019-03-02 NOTE — Patient Instructions (Signed)
Check blood sugars every morning. Limit time spent bearing weight to affected leg to 1-2 hours/day maximum. Continue keflex but change dosing to 1 gm PO twice a day. Clarify with orthopedist re: wound care and Dial soap soaks to wound. Ask surgeon to obtain sterile culture of bone if he is concerned about osteomyelitis to best direct treatment.

## 2019-03-02 NOTE — Progress Notes (Addendum)
Subjective:    Patient ID: Robert Huang, male    DOB: [birth year], 59 y.o.   MRN: [id number]  HPI The patient is a 58 year old white male smoker with significant vascular disease status post stenting and prior right first ray amputation, poorly controlled DM, gout, CAD, and anxiety d/o who is now presenting with an advanced left foot wound infection.  His current wound has been present for approximately 6 weeks.  While no wound cultures were obtained from his foot wound when he saw his orthopedist recently, he was started on empiric Keflex and then referred to our office.  At today's visit, the patient has been taking his Keflex faithfully per his report but his left foot wound is copiously draining purulence and extremely malodorous with streaking erythema noted along the lateral side of his left foot.  Given the location of his ulcer I am concerned that he likely will need surgical intervention rather promptly.  The orthopedic surgery service was contacted regarding the patient's condition and has recommended the patient be admitted to the hospital for a surgical evaluation.  Much of the visit was focused on preparing the patient for inpatient admission and alerting our infectious these colleagues at [facility].  He denies any systemic fevers or chills but has had continued hyperglycemia in recent weeks.  He also continues to smoke almost a pack a day.   Past Medical History:  Diagnosis Date   Anxiety    CAD (coronary artery disease)    sees Dr. Jens Somrenshaw, normal Stress test 07-16-12    Diabetes mellitus    DJD (degenerative joint disease)    Gout    Hyperlipidemia    Hypertension    Hypothyroid    Osteoarthritis    Renal calculus    hx   Umbilical hernia     Past Surgical History:  Procedure Laterality Date   AMPUTATION  07/17/2012   Procedure: AMPUTATION RAY;  Surgeon: Toni ArthursJohn Hewitt, MD;  [facility];  Service: Orthopedics;  Laterality: Right;  right hallux  amputation (1st ray resection )   CORONARY ANGIOPLASTY WITH STENT PLACEMENT     FINGER SURGERY     left index finger   I & D Toe  05/19/2012   rt great toe   I&D EXTREMITY  05/19/2012   Procedure: IRRIGATION AND DEBRIDEMENT EXTREMITY;  Surgeon: Senaida LangeKevin M Supple, MD;  Location: [facility] OR;  Service: Orthopedics;  Laterality: Right;  Right Great toe   TONSILLECTOMY       Family History  Problem Relation Age of Onset   Heart attack Mother    Arthritis Other    Coronary artery disease Other    Diabetes Other    Hypertension Other    Prostate cancer Other    Stroke Other      Social History   Tobacco Use   Smoking status: Never Smoker   Smokeless tobacco: Current User    Types: Chew   Tobacco comment: occ  Substance Use Topics   Alcohol use: Yes    Alcohol/week: 0.0 standard drinks    Comment: weekends   Drug use: No      reports being sexually active.   Outpatient Medications Prior to Visit  Medication Sig Dispense Refill   ALPRAZolam (XANAX) 1 MG tablet Take 1 tablet (1 mg total) by mouth 3 (three) times daily as needed for anxiety. 270 tablet 1   amLODipine (NORVASC) 10 MG tablet Take 1 tablet (10 mg total) by mouth daily. <PLEASE MAKE  APPOINTMENT FOR REFILLS> 90 tablet 3   Aspirin-Acetaminophen-Caffeine (GOODYS EXTRA STRENGTH) 500-325-65 MG PACK Take 1 Package by mouth daily. (Patient taking differently: Take 1 Package by mouth daily. Take additionally as needed during the day) 90 each 3   cephALEXin (KEFLEX) 500 MG capsule Take 1 capsule (500 mg total) by mouth 3 (three) times daily. 21 capsule 0   cloNIDine (CATAPRES) 0.3 MG tablet TAKE 1 TABLET (0.3 MG TOTAL) BY MOUTH 2 (TWO) TIMES DAILY. NEED OV. 60 tablet 11   ezetimibe (ZETIA) 10 MG tablet Take 1 tablet (10 mg total) by mouth daily. 90 tablet 3   fenofibrate 160 MG tablet Take 1 tablet (160 mg total) by mouth daily. NEED OV. 90 tablet 3   glipiZIDE (GLUCOTROL) 10 MG tablet TAKE ONE TABLET BY MOUTH  TWICE A DAY BEFORE A MEAL * NEED OFFICE VISIT FOR FUTURE REFILLS * (Patient taking differently: Take 10 mg by mouth daily before breakfast. ) 180 tablet 3   glucose blood (ONE TOUCH ULTRA TEST) test strip 1 each by Other route daily. Use as instructed 100 each 3   [START ON 04/20/2019] HYDROcodone-acetaminophen (NORCO) 5-325 MG tablet Take 1 tablet by mouth every 6 (six) hours as needed for up to 30 days for moderate pain. 120 tablet 0   indomethacin (INDOCIN SR) 75 MG CR capsule Take 1 capsule (75 mg total) by mouth 2 (two) times daily with a meal. (Patient taking differently: Take 75 mg by mouth as needed. ) 180 capsule 3   levothyroxine (SYNTHROID, LEVOTHROID) 112 MCG tablet TAKE 1 TABLET BY MOUTH EVERY DAY 30 tablet 11   lisinopril-hydrochlorothiazide (PRINZIDE,ZESTORETIC) 20-12.5 MG tablet Take 1 tablet by mouth 2 (two) times daily. 10AM AND 5PM 180 tablet 3   metFORMIN (GLUCOPHAGE) 1000 MG tablet TAKE 1 TABLET BY MOUTH TWO TIMES A DAY WITH A MEAL 180 tablet 3   metoprolol succinate (TOPROL-XL) 100 MG 24 hr tablet Take 1 tablet (100 mg total) by mouth 2 (two) times daily. 180 tablet 3   nitroGLYCERIN (NITROSTAT) 0.4 MG SL tablet Place 1 tablet (0.4 mg total) under the tongue every 5 (five) minutes as needed. For chest pain. 25 tablet 5   rosuvastatin (CRESTOR) 40 MG tablet Take 1 tablet (40 mg total) by mouth daily. <PLEASE MAKE APPOINTMENT FOR REFILLS> 90 tablet 3   colchicine 0.6 MG tablet Take 1 tablet (0.6 mg total) by mouth every 6 (six) hours as needed (gout). (Patient not taking: Reported on 03/02/2019) 60 tablet 5   sitaGLIPtin (JANUVIA) 100 MG tablet Take 1 tablet (100 mg total) by mouth daily. (Patient not taking: Reported on 03/02/2019) 90 tablet 3   zolpidem (AMBIEN) 10 MG tablet TAKE 1 TABLET BY MOUTH EVERYDAY AT BEDTIME (Patient not taking: Reported on 03/02/2019) 90 tablet 1   No facility-administered medications prior to visit.      No Known Allergies    Review of  Systems  Constitutional: Negative for chills, fatigue and fever.  HENT: Negative for congestion, hearing loss, rhinorrhea and sinus pressure.   Eyes: Negative for photophobia, pain, redness and visual disturbance.  Respiratory: Negative for apnea, cough, shortness of breath and wheezing.   Cardiovascular: Negative for chest pain and palpitations.  Gastrointestinal: Negative for abdominal pain, constipation, diarrhea, nausea and vomiting.  Endocrine: Negative for cold intolerance, heat intolerance, polydipsia and polyuria.  Genitourinary: Negative for decreased urine volume, dysuria, frequency, hematuria and testicular pain.  Musculoskeletal: Negative for back pain, myalgias and neck pain.  Skin: Negative for  pallor and rash.  Allergic/Immunologic: Negative for immunocompromised state.  Neurological: Negative for dizziness, seizures, syncope, speech difficulty and light-headedness.  Hematological: Does not bruise/bleed easily.  Psychiatric/Behavioral: Negative for agitation and hallucinations. The patient is not nervous/anxious.        Objective:    Vitals:   03/02/19 0934  BP: 114/70  Pulse: 64  Temp: 97.8 F (36.6 C)  SpO2: 100%   Physical Exam Gen: chronically ill-appearing, appears older than stated age, moderate distress secondary to LT foot wound/pain, reeks of cigarette smoke, A&Ox 3 Head: NCAT, no temporal wasting evident EENT: PERRL, EOMI, MMM, adequate dentition Neck: supple, no JVD CV: NRRR, no murmurs evident Pulm: CTA bilaterally, no wheeze or retractions Abd: soft, NTND, +BS Extrems: 1+ pitting LT LE edema, 1+ pulses MSK: s/p RT 1st ray resection, LT foot wound extremely malodorous with purulent drainage and streaking erythema evident from wound Skin: no rashes, adequate skin turgor Neuro: CN II-XII grossly intact, no focal neurologic deficits appreciated, gait was not assessed, A&Ox 3   Labs: Lab Results  Component Value Date   WBC 5.9 10/03/2018   HGB 13.1  10/03/2018   HCT 39.6 10/03/2018   MCV 91.3 10/03/2018   PLT 277.0 10/03/2018   Lab Results  Component Value Date   NA 134 (L) 10/03/2018   K 3.7 10/03/2018   CL 95 (L) 10/03/2018   CO2 30 10/03/2018   GLUCOSE 148 (H) 10/03/2018   BUN 11 10/03/2018   CREATININE 0.96 10/03/2018   CALCIUM 9.2 10/03/2018   Lab Results  Component Value Date   CRP 1.3 (H) 07/17/2012       Assessment & Plan:  The patient is a 58 year old white male smoker with significant vascular disease status post stenting and prior right first ray amputation, poorly controlled DM, gout, CAD, and anxiety d/o who is now presenting with an advanced left foot wound infection.  Presumed left foot osteomyelitis -patient has copious purulence that is coming from his left lateral midfoot wound along with streaking erythema and significant malodor.  As noted above I contacted the orthopedist while the patient was here in the clinic today.  He agrees with me that the patient should be admitted to the hospital for parenteral antibiotics and surgical debridement versus amputation of his left fifth ray.  I have alerted our infectious disease consultant colleagues at [facility] so they are anticipating his admission.  I would empirically start the patient on vancomycin and Rocephin postoperatively if the patient can be taken the OR today.  If there is a delay of more than 24 hours as the patient appears to be heading toward sepsis, I would reluctantly empirically start vancomycin and Rocephin this evening and work towards his rapid debridement as possible with operative cultures to guide antibiotic treatment.  Even if amputation is pursued, cultures will be needed to guide antibiotics as he may require still 6 weeks of treatment depending on the appearance of his surgical margins.  Vascular disease -patient is now status post stenting to his affected leg, which actually concerns me that he may develop bacteremia from his infection.  As a  result we will check blood cultures x2 upon arrival in the ER to ensure the patient is not actively becoming septic from his foot infection.  Diabetes- would aim for tight glycemic control with blood sugars less than 150 to optimize his infectious outcome.  Tobacco abuse- I have begged the patient to stop smoking as this is only worsening his vascular  disease and providing poor wound healing along with predisposing him to skin breakdown in the first place.  Unfortunately, the patient does not seem psychiatrically prepared to stop this habit at this time.  Nicotine replacement products in this setting will likely only help patient stop physically still noting that would not help with his wound healing issues unfortunately.

## 2019-03-02 NOTE — Care Management (Signed)
This is a no charge note  Transfer from [facility] per Dr. Andria Meuse  58 year old male with a past medical history of hypertension, hyperlipidemia, diabetes mellitus, CAD, stent placement, hypothyroidism, gout, anxiety, who presents with left foot wound with infection.  Patient states that he has a wound in left lateral foot, which has been going on for 6 weeks. Pt was seen by PA physician in orthopedic surgeon, Dr. Almyra Free office on Wednesday.  Patient was started with Keflex.  He reports that he has been taking Keflex, but no significant help.  He developed fever and chills, with temperature 101.5 today.  Patient states that he has mild pain in left foot on walking.  No chest pain, shortness breath, cough.  He has nausea, no vomiting, diarrhea or abdominal pain.  No symptoms of UTI or unilateral weakness.  Patient states that he was scheduled for MRI of left foot to rule out osteomyelitis by orthopedic surgeon, but has not done yet.  Patient was found to have WBC 10.9, lactic acid 0.9, negative COVID-19 test ([facility]), potassium 3.3, renal function normal, temperature 99.2, heart rate 110, 87, oxygen saturation 96% on room air, blood pressure 137/68.  X-ray of left foot showed age-indeterminate fracture of the proximal phalanx of the fifth digit.  Chest x-ray negative.  Patient is placed on MedSurg bed for observation.  Started vancomycin and Rocephin.     Please call manager of [facility] at [phone] when pt arrives to floor   Ivor Costa, MD  [facility]   If 7PM-7AM, please contact night-coverage [email or web address] Password [email or web address] 03/02/2019, 11:50 PM

## 2019-03-02 NOTE — ED Provider Notes (Signed)
[facility] EMERGENCY DEPARTMENT Provider Note   CSN: [id number] Arrival date & time: 03/02/19  2034    History   Chief Complaint Chief Complaint  Patient presents with   Wound Infection    HPI Robert Huang is a 58 y.o. male.     HPI Patient presents with fever and chills.  Has a chronic wound on his left lateral foot.  Sees both Dr. Victorino DikeHewitt and infectious disease.  Saw infectious disease for today.  Has been on Keflex for the last 5 days.  Had been taking somewhat sporadically before that but over the last 5 days been taking constantly.  Today he developed fevers and chills.  Temperature up to 1001.4 at home.  No cough no abdominal pain.  No dysuria.  States the wound is looking better on the foot, however there is worried that it could be extending to the bone.  Plan initially with infectious disease was to have an MRI in around 2 weeks.  No sick contacts. Past Medical History:  Diagnosis Date   Anxiety    CAD (coronary artery disease)    sees Dr. Jens Somrenshaw, normal Stress test 07-16-12    Diabetes mellitus    DJD (degenerative joint disease)    Gout    Hyperlipidemia    Hypertension    Hypothyroid    Osteoarthritis    Renal calculus    hx   Umbilical hernia     Patient Active Problem List   Diagnosis Date Noted   Chronic right shoulder pain 12/02/2018   Diabetes mellitus with complication (HCC) 12/03/2017   Right hip pain 12/27/2015   MRSA (methicillin resistant staph aureus) culture positive 12/31/2012   Open displaced fracture of right great toe 05/20/2012   ANXIETY 12/25/2008   Coronary atherosclerosis 12/25/2008   CHEST PAIN 12/25/2008   Hypothyroidism 05/27/2007   Dyslipidemia 05/27/2007   Gout 05/27/2007   Essential hypertension 05/27/2007   Osteoarthrosis, unspecified whether generalized or localized, unspecified site 05/27/2007   RENAL CALCULUS, HX OF 05/27/2007    Past Surgical History:  Procedure Laterality  Date   AMPUTATION  07/17/2012   Procedure: AMPUTATION RAY;  Surgeon: Toni ArthursJohn Hewitt, MD;  [facility];  Service: Orthopedics;  Laterality: Right;  right hallux amputation (1st ray resection )   CORONARY ANGIOPLASTY WITH STENT PLACEMENT     FINGER SURGERY     left index finger   I & D Toe  05/19/2012   rt great toe   I&D EXTREMITY  05/19/2012   Procedure: IRRIGATION AND DEBRIDEMENT EXTREMITY;  Surgeon: Senaida LangeKevin M Supple, MD;  Location: [facility] OR;  Service: Orthopedics;  Laterality: Right;  Right Great toe   TONSILLECTOMY          Home Medications    Prior to Admission medications   Medication Sig Start Date End Date Taking? Authorizing Provider  ALPRAZolam Prudy Feeler(XANAX) 1 MG tablet Take 1 tablet (1 mg total) by mouth 3 (three) times daily as needed for anxiety. 07/19/18   Nelwyn SalisburyFry, Stephen A, MD  amLODipine (NORVASC) 10 MG tablet Take 1 tablet (10 mg total) by mouth daily. <PLEASE MAKE APPOINTMENT FOR REFILLS> 10/09/18   Nelwyn SalisburyFry, Stephen A, MD  Aspirin-Acetaminophen-Caffeine (GOODYS EXTRA STRENGTH) [id number] MG PACK Take 1 Package by mouth daily. Patient taking differently: Take 1 Package by mouth daily. Take additionally as needed during the day 07/06/15   Nelwyn SalisburyFry, Stephen A, MD  cephALEXin (KEFLEX) 500 MG capsule Take 1 capsule (500 mg total) by mouth 3 (three) times  daily. 01/14/19   Panosh, Standley Brooking, MD  cloNIDine (CATAPRES) 0.3 MG tablet TAKE 1 TABLET (0.3 MG TOTAL) BY MOUTH 2 (TWO) TIMES DAILY. NEED OV. 11/10/18   [name], MD  colchicine 0.6 MG tablet Take 1 tablet (0.6 mg total) by mouth every 6 (six) hours as needed (gout). Patient not taking: Reported on 03/02/2019 05/27/18   [name], MD  ezetimibe (ZETIA) 10 MG tablet Take 1 tablet (10 mg total) by mouth daily. 10/09/18 03/02/19  [name], MD  fenofibrate 160 MG tablet Take 1 tablet (160 mg total) by mouth daily. NEED OV. 10/09/18   [name], MD  glipiZIDE (GLUCOTROL) 10 MG tablet TAKE ONE TABLET BY MOUTH TWICE A DAY BEFORE A MEAL *  NEED OFFICE VISIT FOR FUTURE REFILLS * Patient taking differently: Take 10 mg by mouth daily before breakfast.  03/06/18   [name], MD  glucose blood (ONE TOUCH ULTRA TEST) test strip 1 each by Other route daily. Use as instructed 06/11/18   [name], MD  HYDROcodone-acetaminophen (NORCO) 5-325 MG tablet Take 1 tablet by mouth every 6 (six) hours as needed for up to 30 days for moderate pain. 04/20/19 05/20/19  [name], MD  indomethacin (INDOCIN SR) 75 MG CR capsule Take 1 capsule (75 mg total) by mouth 2 (two) times daily with a meal. Patient taking differently: Take 75 mg by mouth as needed.  12/03/17   [name], MD  levothyroxine (SYNTHROID, LEVOTHROID) 112 MCG tablet TAKE 1 TABLET BY MOUTH EVERY DAY 11/10/18   [name], MD  lisinopril-hydrochlorothiazide (PRINZIDE,ZESTORETIC) 20-12.5 MG tablet Take 1 tablet by mouth 2 (two) times daily. 10AM AND 5PM 10/09/18   [name], MD  metFORMIN (GLUCOPHAGE) 1000 MG tablet TAKE 1 TABLET BY MOUTH TWO TIMES A DAY WITH A MEAL 10/09/18   [name], MD  metoprolol succinate (TOPROL-XL) 100 MG 24 hr tablet Take 1 tablet (100 mg total) by mouth 2 (two) times daily. 10/09/18   [name], MD  nitroGLYCERIN (NITROSTAT) 0.4 MG SL tablet Place 1 tablet (0.4 mg total) under the tongue every 5 (five) minutes as needed. For chest pain. 05/21/18   Lelon Perla, MD  rosuvastatin (CRESTOR) 40 MG tablet Take 1 tablet (40 mg total) by mouth daily. <PLEASE MAKE APPOINTMENT FOR REFILLS> 10/09/18   [name], MD  sitaGLIPtin (JANUVIA) 100 MG tablet Take 1 tablet (100 mg total) by mouth daily. Patient not taking: Reported on 03/02/2019 10/13/18   [name], MD  zolpidem (AMBIEN) 10 MG tablet TAKE 1 TABLET BY MOUTH EVERYDAY AT BEDTIME Patient not taking: Reported on 03/02/2019 09/22/18   [name], MD    Family History Family History  Problem Relation Age of Onset   Heart attack Mother    Arthritis Other    Coronary artery  disease Other    Diabetes Other    Hypertension Other    Prostate cancer Other    Stroke Other     Social History Social History   Tobacco Use   Smoking status: Never Smoker   Smokeless tobacco: Current User    Types: Chew   Tobacco comment: occ  Substance Use Topics   Alcohol use: Yes    Alcohol/week: 0.0 standard drinks    Comment: weekends   Drug use: No     Allergies   Patient has no known allergies.   Review of Systems Review of  Systems  Constitutional: Positive for chills and fever. Negative for appetite change.  HENT: Negative for congestion.   Respiratory: Negative for shortness of breath.   Cardiovascular: Negative for chest pain.  Gastrointestinal: Negative for abdominal pain.  Genitourinary: Negative for flank pain.  Musculoskeletal: Negative for back pain.  Skin: Positive for wound.  Neurological: Negative for weakness.     Physical Exam Updated Vital Signs BP (!) 175/89 (BP Location: Left Arm)    Pulse (!) 110    Temp 99.2 F (37.3 C) (Oral)    Resp 20    Ht 6' (1.829 m)    Wt 96.2 kg    SpO2 98%    BMI 28.75 kg/m   Physical Exam Vitals signs and nursing note reviewed.  Constitutional:      Appearance: Normal appearance.  HENT:     Head: Normocephalic.     Mouth/Throat:     Mouth: Mucous membranes are moist.  Cardiovascular:     Rate and Rhythm: Tachycardia present.     Comments: Initial tachycardia has resolved on reexam. Pulmonary:     Effort: Pulmonary effort is normal.     Breath sounds: No wheezing, rhonchi or rales.  Musculoskeletal:     Comments: Ulcer on the lateral left foot.  Some surrounding erythema.  Previous amputation of right great toe.  Skin:    General: Skin is warm.     Capillary Refill: Capillary refill takes less than 2 seconds.  Neurological:     Mental Status: Mental status is at baseline.        ED Treatments / Results  Labs (all labs ordered are listed, but only abnormal results are  displayed) Labs Reviewed  COMPREHENSIVE METABOLIC PANEL - Abnormal; Notable for the following components:      Result Value   Potassium 3.3 (*)    CO2 20 (*)    Glucose, Bld 203 (*)    Calcium 8.7 (*)    Albumin 3.3 (*)    AST 14 (*)    All other components within normal limits  CBC WITH DIFFERENTIAL/PLATELET - Abnormal; Notable for the following components:   WBC 10.9 (*)    RBC 3.82 (*)    Hemoglobin 11.6 (*)    HCT 35.6 (*)    Neutro Abs 9.4 (*)    Lymphs Abs 0.4 (*)    All other components within normal limits  SARS CORONAVIRUS 2 (HOSP ORDER, PERFORMED IN [facility] VIA [facility] ID)  CULTURE, BLOOD (ROUTINE X 2)  CULTURE, BLOOD (ROUTINE X 2)  LACTIC ACID, PLASMA  URINALYSIS, ROUTINE W REFLEX MICROSCOPIC  LACTIC ACID, PLASMA  SEDIMENTATION RATE  C-REACTIVE PROTEIN    EKG None  Radiology Dg Chest 2 View  Result Date: 03/02/2019 CLINICAL DATA:  Fever. Wound infection of left foot. EXAM: CHEST - 2 VIEW COMPARISON:  07/09/2017 FINDINGS: The cardiomediastinal contours are normal. The lungs are clear. Pulmonary vasculature is normal. No consolidation, pleural effusion, or pneumothorax. No acute osseous abnormalities are seen. IMPRESSION: No acute chest findings. Electronically Signed   By: Narda RutherfordMelanie  Sanford M.D.   On: 03/02/2019 22:31   Dg Foot Complete Left  Result Date: 03/02/2019 CLINICAL DATA:  Lateral affection EXAM: LEFT FOOT - COMPLETE 3+ VIEW COMPARISON:  None. FINDINGS: There is soft tissue swelling about the fifth digit. There is an old fracture deformity of the fifth digit. There is in overlying ulcer. There is an age-indeterminate deformity of the proximal phalanx of the fifth digit. There is soft  tissue swelling about the forefoot. There is a large plantar calcaneal spur. There are advanced degenerative changes of the first metatarsophalangeal joint. IMPRESSION: 1. No definite acute displaced fracture or dislocation. 2. Soft tissue ulcer at the level of the fifth  metatarsal. 3. Old healed fifth metatarsal fracture. 4. Age-indeterminate fracture of the proximal phalanx of the fifth digit. 5. Advanced degenerative changes of the first metatarsophalangeal joint. Electronically Signed   By: Katherine Mantlehristopher  Green M.D.   On: 03/02/2019 22:03    Procedures Procedures (including critical care time)  Medications Ordered in ED Medications - No data to display   Initial Impression / Assessment and Plan / ED Course  I have reviewed the triage vital signs and the nursing notes.  Pertinent labs & imaging results that were available during my care of the patient were reviewed by me and considered in my medical decision making (see chart for details).        Patient with fevers at home up to 101.4.  Is currently on Keflex orally for foot infection.  Unknown if been osteomyelitis.  Has seen Dr. Victorino DikeHewitt in infectious disease.  However now started having fevers.  With this it feels of patient benefit from admission to the hospital for further treatment and work-up and likely IV antibiotics.  Will admit to hospitalist.  Patient is requested that he be able to go by private vehicle. Does not appear to be septic at this time. Final Clinical Impressions(s) / ED Diagnoses   Final diagnoses:  Wound infection    ED Discharge Orders    None       Benjiman CorePickering, Avelynn Sellin, MD 03/02/19 2307

## 2019-03-02 NOTE — ED Triage Notes (Signed)
Pt c/o wound infection to left foot-televisits with PCP and in person visit with ID-states they could not do culture today-is to f/u wound clinic-states he came in tonight due to fever-101.5-took goody powder PTA-NAD-slow gait with cane

## 2019-03-03 ENCOUNTER — Observation Stay (HOSPITAL_COMMUNITY): Payer: 59

## 2019-03-03 ENCOUNTER — Encounter (HOSPITAL_COMMUNITY): Payer: Self-pay | Admitting: Radiology

## 2019-03-03 DIAGNOSIS — M869 Osteomyelitis, unspecified: Secondary | ICD-10-CM | POA: Diagnosis present

## 2019-03-03 DIAGNOSIS — L0390 Cellulitis, unspecified: Secondary | ICD-10-CM

## 2019-03-03 DIAGNOSIS — L03116 Cellulitis of left lower limb: Secondary | ICD-10-CM | POA: Diagnosis present

## 2019-03-03 DIAGNOSIS — Z955 Presence of coronary angioplasty implant and graft: Secondary | ICD-10-CM | POA: Diagnosis not present

## 2019-03-03 DIAGNOSIS — Z79899 Other long term (current) drug therapy: Secondary | ICD-10-CM | POA: Diagnosis not present

## 2019-03-03 DIAGNOSIS — F411 Generalized anxiety disorder: Secondary | ICD-10-CM | POA: Diagnosis present

## 2019-03-03 DIAGNOSIS — E11621 Type 2 diabetes mellitus with foot ulcer: Secondary | ICD-10-CM

## 2019-03-03 DIAGNOSIS — I2510 Atherosclerotic heart disease of native coronary artery without angina pectoris: Secondary | ICD-10-CM | POA: Diagnosis present

## 2019-03-03 DIAGNOSIS — L089 Local infection of the skin and subcutaneous tissue, unspecified: Secondary | ICD-10-CM | POA: Diagnosis not present

## 2019-03-03 DIAGNOSIS — E876 Hypokalemia: Secondary | ICD-10-CM | POA: Diagnosis present

## 2019-03-03 DIAGNOSIS — Z89411 Acquired absence of right great toe: Secondary | ICD-10-CM | POA: Diagnosis not present

## 2019-03-03 DIAGNOSIS — E039 Hypothyroidism, unspecified: Secondary | ICD-10-CM

## 2019-03-03 DIAGNOSIS — I10 Essential (primary) hypertension: Secondary | ICD-10-CM

## 2019-03-03 DIAGNOSIS — M109 Gout, unspecified: Secondary | ICD-10-CM | POA: Diagnosis present

## 2019-03-03 DIAGNOSIS — L97528 Non-pressure chronic ulcer of other part of left foot with other specified severity: Secondary | ICD-10-CM

## 2019-03-03 DIAGNOSIS — E785 Hyperlipidemia, unspecified: Secondary | ICD-10-CM | POA: Diagnosis present

## 2019-03-03 DIAGNOSIS — E1169 Type 2 diabetes mellitus with other specified complication: Secondary | ICD-10-CM | POA: Diagnosis present

## 2019-03-03 DIAGNOSIS — Z79890 Hormone replacement therapy (postmenopausal): Secondary | ICD-10-CM | POA: Diagnosis not present

## 2019-03-03 DIAGNOSIS — Z1159 Encounter for screening for other viral diseases: Secondary | ICD-10-CM | POA: Diagnosis not present

## 2019-03-03 DIAGNOSIS — E118 Type 2 diabetes mellitus with unspecified complications: Secondary | ICD-10-CM

## 2019-03-03 DIAGNOSIS — Z833 Family history of diabetes mellitus: Secondary | ICD-10-CM | POA: Diagnosis not present

## 2019-03-03 DIAGNOSIS — L97529 Non-pressure chronic ulcer of other part of left foot with unspecified severity: Secondary | ICD-10-CM | POA: Diagnosis present

## 2019-03-03 DIAGNOSIS — F17220 Nicotine dependence, chewing tobacco, uncomplicated: Secondary | ICD-10-CM | POA: Diagnosis present

## 2019-03-03 DIAGNOSIS — Z7984 Long term (current) use of oral hypoglycemic drugs: Secondary | ICD-10-CM | POA: Diagnosis not present

## 2019-03-03 LAB — GLUCOSE, CAPILLARY
Glucose-Capillary: 124 mg/dL — ABNORMAL HIGH (ref 70–99)
Glucose-Capillary: 105 mg/dL — ABNORMAL HIGH (ref 70–99)
Glucose-Capillary: 190 mg/dL — ABNORMAL HIGH (ref 70–99)
Glucose-Capillary: 226 mg/dL — ABNORMAL HIGH (ref 70–99)
Glucose-Capillary: 115 mg/dL — ABNORMAL HIGH (ref 70–99)

## 2019-03-03 LAB — CBC
HCT: 34.1 % — ABNORMAL LOW (ref 39.0–52.0)
Hemoglobin: 10.6 g/dL — ABNORMAL LOW (ref 13.0–17.0)
MCH: 29.6 pg (ref 26.0–34.0)
MCHC: 31.1 g/dL (ref 30.0–36.0)
MCV: 95.3 fL (ref 80.0–100.0)
Platelets: 273 10*3/uL (ref 150–400)
RBC: 3.58 MIL/uL — ABNORMAL LOW (ref 4.22–5.81)
RDW: 12.3 % (ref 11.5–15.5)
WBC: 8.3 10*3/uL (ref 4.0–10.5)
nRBC: 0 % (ref 0.0–0.2)

## 2019-03-03 LAB — SURGICAL PCR SCREEN
MRSA, PCR: POSITIVE — AB
Staphylococcus aureus: POSITIVE — AB

## 2019-03-03 LAB — PROTIME-INR
INR: 1.1 (ref 0.8–1.2)
Prothrombin Time: 13.6 seconds (ref 11.4–15.2)

## 2019-03-03 LAB — HIV ANTIBODY (ROUTINE TESTING W REFLEX): HIV Screen 4th Generation wRfx: NONREACTIVE

## 2019-03-03 LAB — BASIC METABOLIC PANEL
Anion gap: 6 (ref 5–15)
BUN: 13 mg/dL (ref 6–20)
CO2: 26 mmol/L (ref 22–32)
Calcium: 8.9 mg/dL (ref 8.9–10.3)
Chloride: 108 mmol/L (ref 98–111)
Creatinine, Ser: 0.87 mg/dL (ref 0.61–1.24)
GFR calc Af Amer: >60 mL/min (ref >60)
GFR calc non Af Amer: >60 mL/min (ref >60)
Glucose, Bld: 131 mg/dL — ABNORMAL HIGH (ref 70–99)
Potassium: 3.7 mmol/L (ref 3.5–5.1)
Sodium: 140 mmol/L (ref 135–145)

## 2019-03-03 LAB — APTT: aPTT: 31 seconds (ref 24–36)

## 2019-03-03 LAB — C-REACTIVE PROTEIN: CRP: 2.9 mg/dL — ABNORMAL HIGH (ref <1.0)

## 2019-03-03 LAB — MAGNESIUM: Magnesium: 1.4 mg/dL — ABNORMAL LOW (ref 1.7–2.4)

## 2019-03-03 LAB — LACTIC ACID, PLASMA: Lactic Acid, Venous: 0.9 mmol/L (ref 0.5–1.9)

## 2019-03-03 MED ORDER — HYDRALAZINE HCL 20 MG/ML IJ SOLN: 5.0000 mg | INTRAMUSCULAR | Status: DC | PRN

## 2019-03-03 MED ORDER — GADOBUTROL 1 MMOL/ML IV SOLN
10.0000 mL | Freq: Once | INTRAVENOUS | Status: AC | PRN
  Administered 2019-03-03: 10 mL via INTRAVENOUS

## 2019-03-03 MED ORDER — MAGNESIUM SULFATE 2 GM/50ML IV SOLN
2.0000 g | Freq: Once | INTRAVENOUS | Status: AC
  Administered 2019-03-03: 17:00:00 2 g via INTRAVENOUS
  Filled 2019-03-03: qty 50

## 2019-03-03 MED ORDER — ACETAMINOPHEN 650 MG RE SUPP: 650.0000 mg | Freq: Four times a day (QID) | RECTAL | Status: DC | PRN

## 2019-03-03 MED ORDER — CHLORHEXIDINE GLUCONATE CLOTH 2 % EX PADS
6.0000 | MEDICATED_PAD | Freq: Every day | CUTANEOUS | Status: AC
  Administered 2019-03-03 – 2019-03-06 (×4): 6 via TOPICAL

## 2019-03-03 MED ORDER — VANCOMYCIN HCL 10 G IV SOLR
1500.0000 mg | Freq: Two times a day (BID) | INTRAVENOUS | Status: DC
  Administered 2019-03-03 – 2019-03-05 (×5): 1500 mg via INTRAVENOUS
  Filled 2019-03-03 (×5): qty 1500

## 2019-03-03 MED ORDER — ONDANSETRON HCL 4 MG/2ML IJ SOLN: 4.0000 mg | Freq: Four times a day (QID) | INTRAMUSCULAR | Status: DC | PRN

## 2019-03-03 MED ORDER — INSULIN ASPART 100 UNIT/ML ~~LOC~~ SOLN
0.0000 [IU] | Freq: Three times a day (TID) | SUBCUTANEOUS | Status: DC
  Administered 2019-03-03: 3 [IU] via SUBCUTANEOUS
  Administered 2019-03-04 – 2019-03-05 (×5): 2 [IU] via SUBCUTANEOUS
  Administered 2019-03-05: 3 [IU] via SUBCUTANEOUS
  Administered 2019-03-06: 1 [IU] via SUBCUTANEOUS
  Administered 2019-03-09 (×2): 2 [IU] via SUBCUTANEOUS
  Administered 2019-03-09: 14:00:00 1 [IU] via SUBCUTANEOUS

## 2019-03-03 MED ORDER — MUPIROCIN 2 % EX OINT
1.0000 "application " | TOPICAL_OINTMENT | Freq: Two times a day (BID) | CUTANEOUS | Status: AC
  Administered 2019-03-03 – 2019-03-06 (×9): 1 via NASAL
  Filled 2019-03-03 (×2): qty 22

## 2019-03-03 MED ORDER — ONDANSETRON HCL 4 MG PO TABS: 4.0000 mg | ORAL_TABLET | Freq: Four times a day (QID) | ORAL | Status: DC | PRN

## 2019-03-03 MED ORDER — COLLAGENASE 250 UNIT/GM EX OINT
1.0000 "application " | TOPICAL_OINTMENT | Freq: Every day | CUTANEOUS | Status: DC
  Administered 2019-03-03 – 2019-03-05 (×3): 1 via TOPICAL
  Filled 2019-03-03: qty 30

## 2019-03-03 MED ORDER — SODIUM CHLORIDE 0.9 % IV SOLN
2.0000 g | INTRAVENOUS | Status: DC
  Administered 2019-03-03 – 2019-03-04 (×2): 2 g via INTRAVENOUS
  Filled 2019-03-03 (×2): qty 2
  Filled 2019-03-03: qty 20

## 2019-03-03 MED ORDER — METRONIDAZOLE IN NACL 5-0.79 MG/ML-% IV SOLN
500.0000 mg | Freq: Three times a day (TID) | INTRAVENOUS | Status: DC
  Administered 2019-03-03 – 2019-03-05 (×7): 500 mg via INTRAVENOUS
  Filled 2019-03-03 (×7): qty 100

## 2019-03-03 MED ORDER — HEPARIN SODIUM (PORCINE) 5000 UNIT/ML IJ SOLN
5000.0000 [IU] | Freq: Three times a day (TID) | INTRAMUSCULAR | Status: DC
  Administered 2019-03-03 – 2019-03-09 (×19): 5000 [IU] via SUBCUTANEOUS
  Filled 2019-03-03 (×19): qty 1

## 2019-03-03 MED ORDER — HYDROCHLOROTHIAZIDE 12.5 MG PO CAPS
12.5000 mg | ORAL_CAPSULE | Freq: Two times a day (BID) | ORAL | Status: DC
  Administered 2019-03-03 – 2019-03-09 (×11): 12.5 mg via ORAL
  Filled 2019-03-03 (×12): qty 1

## 2019-03-03 MED ORDER — LISINOPRIL 20 MG PO TABS
20.0000 mg | ORAL_TABLET | Freq: Two times a day (BID) | ORAL | Status: DC
  Administered 2019-03-03 – 2019-03-09 (×11): 20 mg via ORAL
  Filled 2019-03-03 (×12): qty 1

## 2019-03-03 MED ORDER — ACETAMINOPHEN 325 MG PO TABS
650.0000 mg | ORAL_TABLET | Freq: Four times a day (QID) | ORAL | Status: DC | PRN
  Administered 2019-03-06 – 2019-03-08 (×7): 650 mg via ORAL
  Filled 2019-03-03 (×8): qty 2

## 2019-03-03 MED ORDER — POTASSIUM CHLORIDE CRYS ER 20 MEQ PO TBCR
40.0000 meq | EXTENDED_RELEASE_TABLET | Freq: Once | ORAL | Status: AC
  Administered 2019-03-03: 40 meq via ORAL
  Filled 2019-03-03: qty 2

## 2019-03-03 MED ORDER — INSULIN ASPART 100 UNIT/ML ~~LOC~~ SOLN: 0.0000 [IU] | Freq: Every day | SUBCUTANEOUS | Status: DC

## 2019-03-03 NOTE — Progress Notes (Signed)
Called for patient breakfast at [place] through [facility]. Breakfast not delivered by 0930, called [place] again, and tray was supposed to be delivered. Followed with with kitchen and stated that breakfast was supposed to be delivered by 0924.   Called [facility] again at 1007, breakfast still not delivered.   Called kitchen at 1011 and there was no answer.    Patient is very upset as he has not eaten anything since last night.

## 2019-03-03 NOTE — Progress Notes (Signed)
Pharmacy Antibiotic Note  Robert Huang is a 58 y.o. male admitted on 03/02/2019 with wound infection, r/o osteomyelitis .  Pharmacy has been consulted for Vancomycin dosing. WBC 10.9. Renal function good. Planning for MRI to r/o osteo.   Plan: Vancomycin 1500 mg IV q12h >>Estimated AUC: 506 Ceftriaxone per MD Trend WBC, temp, renal function  F/U infectious work-up Drug levels as indicated   Height: 6' (182.9 cm) Weight: 212 lb (96.2 kg) IBW/kg (Calculated) : 77.6  Temp (24hrs), Avg:98.7 F (37.1 C), Min:97.8 F (36.6 C), Max:99.2 F (37.3 C)  Recent Labs  Lab 03/02/19 2201 03/02/19 2352  WBC 10.9*  --   CREATININE 0.89  --   LATICACIDVEN 0.9 0.9    Estimated Creatinine Clearance: 107.4 mL/min (by C-G formula based on SCr of 0.89 mg/dL).    No Known Allergies  Narda Bonds, PharmD, BCPS Clinical Pharmacist Phone: [phone]

## 2019-03-03 NOTE — Progress Notes (Signed)
ABI completed. Refer to "CV Proc" under chart review to view preliminary results.  03/03/2019 9:48 AM Maudry Mayhew, MHA, RVT, RDCS, RDMS

## 2019-03-03 NOTE — Consult Note (Signed)
Reason for Consult: Left foot wound with drainage and recent onset fevers Referring Physician: Dr. Lowell GuitarPowell  HPI: Robert Huang is an 58 y.o. male who was recently evaluated in Dr. Jonny RuizJohn Hewitt's clinic where he was found to have a left lateral midfoot wound with concern for potential deep infection and possibly osteomyelitis.  He was placed on oral antibiotics with instruction for follow-up in 2 weeks.  Subsequently he developed low-grade fevers with increasing drainage from the wound and presented to the emergency room and has subsequently been admitted to the hospitalist service.  Patient has a past history of a right first ray amputation performed by Dr. Victorino DikeHewitt back in 2013.  Past Medical History:  Diagnosis Date  . Anxiety   . CAD (coronary artery disease)    sees Dr. Jens Somrenshaw, normal Stress test 07-16-12   . Diabetes mellitus   . DJD (degenerative joint disease)   . Gout   . Hyperlipidemia   . Hypertension   . Hypothyroid   . Osteoarthritis   . Renal calculus    hx  . Umbilical hernia     Past Surgical History:  Procedure Laterality Date  . AMPUTATION  07/17/2012   Procedure: AMPUTATION RAY;  Surgeon: Toni ArthursJohn Hewitt, MD;  [facility];  Service: Orthopedics;  Laterality: Right;  right hallux amputation (1st ray resection )  . CORONARY ANGIOPLASTY WITH STENT PLACEMENT    . FINGER SURGERY     left index finger  . I & D Toe  05/19/2012   rt great toe  . I&D EXTREMITY  05/19/2012   Procedure: IRRIGATION AND DEBRIDEMENT EXTREMITY;  Surgeon: Senaida LangeKevin M Nilesh Stegall, MD;  Location: [facility] OR;  Service: Orthopedics;  Laterality: Right;  Right Great toe  . TONSILLECTOMY      Family History  Problem Relation Age of Onset  . Heart attack Mother   . Arthritis Other   . Coronary artery disease Other   . Diabetes Other   . Hypertension Other   . Prostate cancer Other   . Stroke Other     Social History:  reports that he has never smoked. His smokeless tobacco use includes chew. He reports  current alcohol use. He reports that he does not use drugs.  Allergies: No Known Allergies  Medications: I have reviewed the patient's current medications.  Results for orders placed or performed during the hospital encounter of 03/02/19 (from the past 48 hour(s))  Comprehensive metabolic panel     Status: Abnormal   Collection Time: 03/02/19 10:01 PM  Result Value Ref Range   Sodium 135 135 - 145 mmol/L   Potassium 3.3 (L) 3.5 - 5.1 mmol/L   Chloride 106 98 - 111 mmol/L   CO2 20 (L) 22 - 32 mmol/L   Glucose, Bld 203 (H) 70 - 99 mg/dL   BUN 15 6 - 20 mg/dL   Creatinine, Ser 1.610.89 0.61 - 1.24 mg/dL   Calcium 8.7 (L) 8.9 - 10.3 mg/dL   Total Protein 6.9 6.5 - 8.1 g/dL   Albumin 3.3 (L) 3.5 - 5.0 g/dL   AST 14 (L) 15 - 41 U/L   ALT 15 0 - 44 U/L   Alkaline Phosphatase 69 38 - 126 U/L   Total Bilirubin 0.5 0.3 - 1.2 mg/dL   GFR calc non Af Amer >60 >60 mL/min   GFR calc Af Amer >60 >60 mL/min   Anion gap 9 5 - 15    Comment: Performed at [facility], [address] [place], [place] [address]  Lactic acid, plasma     Status: None   Collection Time: 03/02/19 10:01 PM  Result Value Ref Range   Lactic Acid, Venous 0.9 0.5 - 1.9 mmol/L    Comment: Performed at [facility], [address] [place], [place] [address]  CBC with Differential     Status: Abnormal   Collection Time: 03/02/19 10:01 PM  Result Value Ref Range   WBC 10.9 (H) 4.0 - 10.5 K/uL   RBC 3.82 (L) 4.22 - 5.81 MIL/uL   Hemoglobin 11.6 (L) 13.0 - 17.0 g/dL   HCT 35.6 (L) 39.0 - 52.0 %   MCV 93.2 80.0 - 100.0 fL   MCH 30.4 26.0 - 34.0 pg   MCHC 32.6 30.0 - 36.0 g/dL   RDW 12.1 11.5 - 15.5 %   Platelets 283 150 - 400 K/uL   nRBC 0.0 0.0 - 0.2 %   Neutrophils Relative % 85 %   Neutro Abs 9.4 (H) 1.7 - 7.7 K/uL   Lymphocytes Relative 4 %   Lymphs Abs 0.4 (L) 0.7 - 4.0 K/uL   Monocytes Relative 9 %   Monocytes Absolute 1.0 0.1 - 1.0 K/uL   Eosinophils Relative 1 %   Eosinophils Absolute 0.1  0.0 - 0.5 K/uL   Basophils Relative 0 %   Basophils Absolute 0.0 0.0 - 0.1 K/uL   Immature Granulocytes 1 %   Abs Immature Granulocytes 0.05 0.00 - 0.07 K/uL    Comment: Performed at [facility], [address] [place], [place] [address]  Sedimentation rate     Status: Abnormal   Collection Time: 03/02/19 10:01 PM  Result Value Ref Range   Sed Rate 50 (H) 0 - 16 mm/hr    Comment: Performed at [facility], [address] [place], [place] [address]  C-reactive protein     Status: Abnormal   Collection Time: 03/02/19 10:01 PM  Result Value Ref Range   CRP 2.9 (H) <1.0 mg/dL    Comment: Performed at [facility], [place] [address], [place], [place] [address]  SARS Coronavirus 2 (Hosp order,Performed in [facility] lab via Abbott ID)     Status: None   Collection Time: 03/02/19 10:01 PM   Specimen: Dry Nasal Swab (Abbott ID Now)  Result Value Ref Range   SARS Coronavirus 2 (Abbott ID Now) NEGATIVE NEGATIVE    Comment: (NOTE) SARS-CoV-2 target nucleic acids are NOT DETECTED. The SARS-CoV-2 RNA is generally detectable in upper and lower respiratory specimens during the acute phase of infection.  Negativeresults do not preclude SARS-CoV-2 infection, do not rule out coinfections with other pathogens, and should not be used as the  sole basis for treatment or other patient management decisions.  Negative results must be combined with clinical observations, patient history, and epidemiological information. The expected result is Negative. Fact Sheet for Patients: GolfingFamily.no Fact Sheet for Healthcare Providers: https://www.hernandez-brewer.com/ This test is not yet approved or cleared by the [place] FDA and  has been authorized for detection and/or diagnosis of SARS-CoV-2 by FDA under an Emergency Use Authorization (EUA).  This EUA will remain in effect (meaning this test can be used) for the duration of  the COVID19  declaration under Section 5 64(b)(1) of the Act, 21 U.S.C.  section 6417734802 3(b)(1), unless the authorization is terminated or revoked sooner. Performed at [facility], [address] [place], [place] [address]  Urinalysis, Routine w reflex microscopic     Status: None   Collection Time: 03/02/19 10:04 PM  Result Value Ref Range   Color, Urine YELLOW YELLOW   APPearance CLEAR CLEAR   Specific Gravity, Urine 1.025 1.005 - 1.030   pH 5.5 5.0 - 8.0   Glucose, UA NEGATIVE NEGATIVE mg/dL   Hgb urine dipstick NEGATIVE NEGATIVE   Bilirubin Urine NEGATIVE NEGATIVE   Ketones, ur NEGATIVE NEGATIVE mg/dL   Protein, ur NEGATIVE NEGATIVE mg/dL   Nitrite NEGATIVE NEGATIVE   Leukocytes,Ua NEGATIVE NEGATIVE    Comment: Microscopic not done on urines with negative protein, blood, leukocytes, nitrite, or glucose < 500 mg/dL. Performed at [facility], [address] [place], [place] [address]   Lactic acid, plasma     Status: None   Collection Time: 03/02/19 11:52 PM  Result Value Ref Range   Lactic Acid, Venous 0.9 0.5 - 1.9 mmol/L    Comment: Performed at [facility], [address], [place], [place] [address]  Surgical PCR screen     Status: Abnormal   Collection Time: 03/03/19  2:02 AM   Specimen: Nasal Mucosa; Nasal Swab  Result Value Ref Range   MRSA, PCR POSITIVE (A) NEGATIVE    Comment: CRITICAL RESULT CALLED TO, READ BACK BY AND VERIFIED WITH: RN KNAPP AT 0413 03/03/19 CRUICKSHANK A    Staphylococcus aureus POSITIVE (A) NEGATIVE    Comment: (NOTE) The Xpert SA Assay (FDA approved for NASAL specimens in patients 47 years of age and older), is one component of a comprehensive surveillance program. It is not intended to diagnose infection nor to guide or monitor treatment. Performed at [facility], [address] [place], [place] [address]   Glucose, capillary     Status: Abnormal   Collection Time: 03/03/19  2:59 AM  Result  Value Ref Range   Glucose-Capillary 124 (H) 70 - 99 mg/dL   Comment 1 Notify RN   Magnesium     Status: Abnormal   Collection Time: 03/03/19  3:11 AM  Result Value Ref Range   Magnesium 1.4 (L) 1.7 - 2.4 mg/dL    Comment: Performed at [facility], [address] [place], [place] [address]  Protime-INR     Status: None   Collection Time: 03/03/19  3:11 AM  Result Value Ref Range   Prothrombin Time 13.6 11.4 - 15.2 seconds   INR 1.1 0.8 - 1.2    Comment: (NOTE) INR goal varies based on device and disease states. Performed at [facility], [address], [place], [place] [address]   APTT     Status: None   Collection Time: 03/03/19  3:11 AM  Result Value Ref Range   aPTT 31 24 - 36 seconds    Comment: Performed at [facility], [address], [place], [place] [address]  HIV antibody (Routine Testing)     Status: None   Collection Time: 03/03/19  3:11 AM  Result Value Ref Range   HIV Screen 4th Generation wRfx Non Reactive Non Reactive    Comment: (NOTE) Performed At: [facility] [address] [place], [place] [id number] Jolene Schimke MD BM:[id number]   Basic metabolic panel     Status: Abnormal   Collection Time: 03/03/19  3:11 AM  Result Value Ref Range   Sodium 140 135 - 145 mmol/L   Potassium 3.7 3.5 - 5.1 mmol/L   Chloride 108 98 -  111 mmol/L   CO2 26 22 - 32 mmol/L   Glucose, Bld 131 (H) 70 - 99 mg/dL   BUN 13 6 - 20 mg/dL   Creatinine, Ser 0.860.87 0.61 - 1.24 mg/dL   Calcium 8.9 8.9 - 57.810.3 mg/dL   GFR calc non Af Amer >60 >60 mL/min   GFR calc Af Amer >60 >60 mL/min   Anion gap 6 5 - 15    Comment: Performed at [facility], [address] [place], [place] [id number]  CBC     Status: Abnormal   Collection Time: 03/03/19  3:11 AM  Result Value Ref Range   WBC 8.3 4.0 - 10.5 K/uL   RBC 3.58 (L) 4.22 - 5.81 MIL/uL   Hemoglobin 10.6 (L) 13.0 - 17.0 g/dL   HCT 95.234.1 (L) 84.139.0 -  52.0 %   MCV 95.3 80.0 - 100.0 fL   MCH 29.6 26.0 - 34.0 pg   MCHC 31.1 30.0 - 36.0 g/dL   RDW 32.412.3 40.111.5 - 02.715.5 %   Platelets 273 150 - 400 K/uL   nRBC 0.0 0.0 - 0.2 %    Comment: Performed at [facility], [address] [place], [place] [id number]  Glucose, capillary     Status: Abnormal   Collection Time: 03/03/19  7:34 AM  Result Value Ref Range   Glucose-Capillary 105 (H) 70 - 99 mg/dL  Glucose, capillary     Status: Abnormal   Collection Time: 03/03/19 11:52 AM  Result Value Ref Range   Glucose-Capillary 190 (H) 70 - 99 mg/dL  Glucose, capillary     Status: Abnormal   Collection Time: 03/03/19  4:20 PM  Result Value Ref Range   Glucose-Capillary 226 (H) 70 - 99 mg/dL    Dg Chest 2 View  Result Date: 03/02/2019 CLINICAL DATA:  Fever. Wound infection of left foot. EXAM: CHEST - 2 VIEW COMPARISON:  07/09/2017 FINDINGS: The cardiomediastinal contours are normal. The lungs are clear. Pulmonary vasculature is normal. No consolidation, pleural effusion, or pneumothorax. No acute osseous abnormalities are seen. IMPRESSION: No acute chest findings. Electronically Signed   By: Narda RutherfordMelanie  Sanford M.D.   On: 03/02/2019 22:31   Mr Foot Left W Wo Contrast  Result Date: 03/03/2019 CLINICAL DATA:  Diabetic patient with a skin wound adjacent to the fifth metatarsal. EXAM: MRI OF THE LEFT FOREFOOT WITHOUT AND WITH CONTRAST TECHNIQUE: Multiplanar, multisequence MR imaging of the left forefoot was performed both before and after administration of intravenous contrast. CONTRAST:  10 cc Gadavist IV. COMPARISON:  Plain films left foot 03/02/2019 FINDINGS: Bones/Joint/Cartilage Healed fracture of the distal diaphysis of the fifth metatarsal is seen as on the prior plain films. There is marrow edema and enhancement throughout the fifth metatarsal consistent with osteomyelitis. There is no other marrow abnormality to suggest osteomyelitis. Loss of fat saturation in all the toes on T2 weighted  imaging is incidentally noted. Ligaments Intact. Muscles and Tendons Atrophy of intrinsic musculature the foot is identified. No intramuscular fluid collection is seen. Soft tissues Skin wound along the lateral margin of the distal diaphysis of the fifth metatarsal appears to extend to bone. No underlying abscess. Soft tissue edema and enhancement about the fifth metatarsal are identified. IMPRESSION: Edema and enhancement throughout the fifth metatarsal consistent with osteomyelitis. Skin ulceration and surrounding cellulitis along the distal diaphysis of the fifth metatarsal noted. Healed fracture distal fifth metatarsal as seen on prior plain films. Electronically Signed   By: Drusilla Kannerhomas  Dalessio  M.D.   On: 03/03/2019 12:01   Dg Foot Complete Left  Result Date: 03/02/2019 CLINICAL DATA:  Lateral affection EXAM: LEFT FOOT - COMPLETE 3+ VIEW COMPARISON:  None. FINDINGS: There is soft tissue swelling about the fifth digit. There is an old fracture deformity of the fifth digit. There is in overlying ulcer. There is an age-indeterminate deformity of the proximal phalanx of the fifth digit. There is soft tissue swelling about the forefoot. There is a large plantar calcaneal spur. There are advanced degenerative changes of the first metatarsophalangeal joint. IMPRESSION: 1. No definite acute displaced fracture or dislocation. 2. Soft tissue ulcer at the level of the fifth metatarsal. 3. Old healed fifth metatarsal fracture. 4. Age-indeterminate fracture of the proximal phalanx of the fifth digit. 5. Advanced degenerative changes of the first metatarsophalangeal joint. Electronically Signed   By: Katherine Mantlehristopher  Green M.D.   On: 03/02/2019 22:03   Vas [place]as Vanice Sarahbi With/wo Tbi  Result Date: 03/03/2019 LOWER EXTREMITY DOPPLER STUDY Indications: Ulceration. High Risk         Hypertension, hyperlipidemia, Diabetes, coronary artery Factors:          disease. Other Factors: History of right great toe amputation.  Comparison Study:  No prior study. Performing Technologist: Gertie FeySimonetti, Michelle MHA, RVT, RDCS, RDMS  Examination Guidelines: A complete evaluation includes at minimum, Doppler waveform signals and systolic blood pressure reading at the level of bilateral brachial, anterior tibial, and posterior tibial arteries, when vessel segments are accessible. Bilateral testing is considered an integral part of a complete examination. Photoelectric Plethysmograph (PPG) waveforms and toe systolic pressure readings are included as required and additional duplex testing as needed. Limited examinations for reoccurring indications may be performed as noted.  ABI Findings: +---------+------------------+-----+---------+----------+ Right    Rt Pressure (mmHg)IndexWaveform Comment    +---------+------------------+-----+---------+----------+ Brachial 156                    triphasic           +---------+------------------+-----+---------+----------+ PTA      142               0.91 triphasic           +---------+------------------+-----+---------+----------+ DP       150               0.96 triphasic           +---------+------------------+-----+---------+----------+ Great Toe                                Amputation +---------+------------------+-----+---------+----------+ +---------+------------------+-----+---------+-------+ Left     Lt Pressure (mmHg)IndexWaveform Comment +---------+------------------+-----+---------+-------+ Brachial 155                    triphasic        +---------+------------------+-----+---------+-------+ PTA      185               1.19 triphasic        +---------+------------------+-----+---------+-------+ DP       181               1.16 triphasic        +---------+------------------+-----+---------+-------+ Great Toe128               0.82                  +---------+------------------+-----+---------+-------+ +-------+-----------+-----------+------------+------------+  ABI/TBIToday's ABIToday's TBIPrevious ABIPrevious TBI +-------+-----------+-----------+------------+------------+ Right  0.96       -                                   +-------+-----------+-----------+------------+------------+  Left   1.19       0.82                                +-------+-----------+-----------+------------+------------+  Summary: Right: Resting right ankle-brachial index is within normal range. No evidence of significant right lower extremity arterial disease. Left: Resting left ankle-brachial index is within normal range. No evidence of significant left lower extremity arterial disease. The left toe-brachial index is normal.  *See table(s) above for measurements and observations.     Preliminary      Vitals Temp:  [98.6 F (37 C)-99.2 F (37.3 C)] 98.6 F (37 C) (07/07 1357) Pulse Rate:  [59-110] 59 (07/07 1357) Resp:  [16-20] 16 (07/07 1357) BP: (121-175)/(63-89) 123/63 (07/07 1357) SpO2:  [96 %-100 %] 98 % (07/07 1357) Weight:  [94.3 kg-96.2 kg] 94.3 kg (07/07 0130) Body mass index is 28.18 kg/m.  Physical Exam: Patient is a well-developed, healthy-appearing gentleman who is alert and oriented.  Inspection of the left lower extremity demonstrates a gauze dressing wrapped about the midfoot.  The calf demonstrates no areas of ecchymosis or erythema.  No obvious a sending cellulitis or lymphangitis.  Removal of the dressing demonstrates minimal swelling of the foot.  There is an approximately 1 x 2 cm open wound over the lateral midfoot with some scant serous drainage but no foul smell.  With compression of the surrounding tissues I do not appreciate fluctuance.  Mild tenderness.  Recent left foot MRI scan confirms edema within the fifth metatarsal consistent with osteomyelitis.      Impression:   Left lateral midfoot wound with recent MRI scan demonstrating changes consistent with osteomyelitis of the left fifth metatarsal.  Plan:  I have  counseled Robert Huang regarding today's clinical findings as well as the various treatment options.  Dr. Victorino Dike his most recent treating orthopedic surgeon for his foot condition is out of town this week.  I have contacted Dr. Duwayne Heck who is available tomorrow to evaluate Robert Huang left foot and further discuss definitive surgical management which would likely involve a fifth ray amputation.  Continue with current IV antibiotics and dry dressing, elevate left lower extremity.      Shanyn Preisler M Camey Edell 03/03/2019, 4:27 PM  Contact # [phone]

## 2019-03-03 NOTE — Progress Notes (Signed)
PROGRESS NOTE    [name]  LZJ:[id number] DOB: 1961/03/22 DOA: 03/02/2019 PCP: [name], MD   Brief Narrative:  Robert Huang is Robert Huang 58 y.o. male with medical history significant of hypertension, hyperlipidemia, diabetes mellitus, CAD, stent placement, hypothyroidism, gout, anxiety, who presents with left foot wound with infection.  Patient states that he has [name] wound in left lateral foot, which has been going on for 6 weeks.Pt was seen by PAphysicianinorthopedic surgeon, Dr. Eustace Quail onWednesday. Patient was started with Keflex.He reports that he has been taking Keflex,but no significant help. He developed fever and chills, with temperature 101.5today. Patient states that he has mildpain in left foot on walking.No chest pain, shortness breath, cough. He has nausea, no vomiting, diarrhea or abdominal pain. No symptoms of UTI or unilateral weakness. Patient states that he was scheduled for MRI of left foot to rule out osteomyelitis by orthopedic surgeon, but has not done yet.  ED Course: pt was found to have WBC 10.9, lactic acid 0.9, negative COVID-19 test([facility]), potassium 3.3, renal function normal, temperature 99.2, heart rate 110, 87, oxygen saturation 96% on room air, blood pressure 137/68. X-ray of left foot showed age-indeterminate fracture of the proximal phalanx of the fifth digit.Chest x-ray negative. Patient is placed on MedSurg bed for observation.   Assessment & Plan:   Principal Problem:   Left foot infection Active Problems:   Hypothyroidism   Dyslipidemia   Gout   Anxiety state   Essential hypertension   Coronary atherosclerosis   Diabetes mellitus with complication (HCC)   Diabetic foot ulcers (HCC)   Hypokalemia  Left foot diabetic foot ulcers with infection: pt developed fever of 101.5 and chills.  Has mild leukocytosis with WBC 10.9.  Currently hemodynamically stable. Pt failed outpatient antibiotic of Keflex  treatment. - IV abx with vanc/ceftriaxone/flagyl - MRI with evidence of osteomyelitis throughout 5th metatarsal with skin ulceration and surrounding cellulitis noted as well - ABI's wnl (preliminary study) - PRN Zofran for nausea, Norco for pain - Blood cultures x 2 pending - ESR (50) and CRP (2.9) - wound care consult, appreciate recs  Hypothyroidism: -Synthroid  Dyslipidemia: -Crestor, Zetia, fenofibrate-  Gout: -prn Indocin  Anxiety state: was on Xanax. No anxiety today -pt states that he dose not need Xanax today  Essential hypertension: -Continue home amlodipine, clonidine, Prinzide, metoprolol  Coronary atherosclerosis: s/p of stent. No CP. -continue Crestor, Zetia, fenofibrate metoprolol, PRN nitroglycerin  Diabetes mellitus with complication, with diabetic foot ulcer: Last A1c 8.0 on 10/03/18, poorly controled. Patient is taking glipizide and metformin at home -SSI  Hypokalemia: K=3.3 on admission. - Repleted - Check Mg level  DVT prophylaxis: heparin Code Status: full  Family Communication: none at bedside Disposition Plan: pending orthopedic eval and further improvement  Consultants:   orthopedics  Procedures:  ABI (prelim) Summary: Right: Resting right ankle-brachial index is within normal range. No evidence of significant right lower extremity arterial disease.  Left: Resting left ankle-brachial index is within normal range. No evidence of significant left lower extremity arterial disease. The left toe-brachial index is normal.  Antimicrobials:  Anti-infectives (From admission, onward)   Start     Dose/Rate Route Frequency Ordered Stop   03/03/19 1400  vancomycin (VANCOCIN) 1,500 mg in sodium chloride 0.9 % 500 mL IVPB     1,500 mg 250 mL/hr over 120 Minutes Intravenous Every 12 hours 03/03/19 0147     03/03/19 1400  cefTRIAXone (ROCEPHIN) 2 g in sodium chloride 0.9 % 100 mL IVPB  2 g 200 mL/hr over 30 Minutes Intravenous Every 24 hours  03/03/19 1259     03/03/19 0900  metroNIDAZOLE (FLAGYL) IVPB 500 mg     500 mg 100 mL/hr over 60 Minutes Intravenous Every 8 hours 03/03/19 0725     03/03/19 0000  cefTRIAXone (ROCEPHIN) 1 g in sodium chloride 0.9 % 100 mL IVPB  Status:  Discontinued     1 g 200 mL/hr over 30 Minutes Intravenous Every 24 hours 03/02/19 2349 03/03/19 1259   03/03/19 0000  vancomycin (VANCOCIN) 2,000 mg in sodium chloride 0.9 % 500 mL IVPB     2,000 mg 250 mL/hr over 120 Minutes Intravenous  Once 03/02/19 2350 03/03/19 0444     Subjective: Feels ok. Presented due to fever and LLE wound.  Objective: Vitals:   03/03/19 0123 03/03/19 0130 03/03/19 0524 03/03/19 1357  BP: 121/63  130/76 123/63  Pulse: 66  63 (!) 59  Resp: 18  18 16   Temp: 98.8 F (37.1 C)  98.8 F (37.1 C) 98.6 F (37 C)  TempSrc: Oral  Oral Oral  SpO2: 100%  99% 98%  Weight:  94.3 kg    Height:  6' (1.829 m)      Intake/Output Summary (Last 24 hours) at 03/03/2019 1408 Last data filed at 03/03/2019 0919 Gross per 24 hour  Intake 600 ml  Output 800 ml  Net -200 ml   Filed Weights   03/02/19 2047 03/03/19 0130  Weight: 96.2 kg 94.3 kg    Examination:  General exam: Appears calm and comfortable  Respiratory system: Clear to auscultation. Respiratory effort normal. Cardiovascular system: S1 & S2 heard, RRR.  Gastrointestinal system: Abdomen is nondistended, soft and nontender. Central nervous system: Alert and oriented. No focal neurological deficits. Extremities: LLE with intact dressing (photo from admission reviewed) Psychiatry: Judgement and insight appear normal. Mood & affect appropriate.     Data Reviewed: I have personally reviewed following labs and imaging studies  CBC: Recent Labs  Lab 03/02/19 2201 03/03/19 0311  WBC 10.9* 8.3  NEUTROABS 9.4*  --   HGB 11.6* 10.6*  HCT 35.6* 34.1*  MCV 93.2 95.3  PLT 283 751   Basic Metabolic Panel: Recent Labs  Lab 03/02/19 2201 03/03/19 0311  NA 135 140  K  3.3* 3.7  CL 106 108  CO2 20* 26  GLUCOSE 203* 131*  BUN 15 13  CREATININE 0.89 0.87  CALCIUM 8.7* 8.9  MG  --  1.4*   GFR: Estimated Creatinine Clearance: 109 mL/min (by C-G formula based on SCr of 0.87 mg/dL). Liver Function Tests: Recent Labs  Lab 03/02/19 2201  AST 14*  ALT 15  ALKPHOS 69  BILITOT 0.5  PROT 6.9  ALBUMIN 3.3*   No results for input(s): LIPASE, AMYLASE in the last 168 hours. No results for input(s): AMMONIA in the last 168 hours. Coagulation Profile: Recent Labs  Lab 03/03/19 0311  INR 1.1   Cardiac Enzymes: No results for input(s): CKTOTAL, CKMB, CKMBINDEX, TROPONINI in the last 168 hours. BNP (last 3 results) No results for input(s): PROBNP in the last 8760 hours. HbA1C: No results for input(s): HGBA1C in the last 72 hours. CBG: Recent Labs  Lab 03/03/19 0259 03/03/19 0734 03/03/19 1152  GLUCAP 124* 105* 190*   Lipid Profile: No results for input(s): CHOL, HDL, LDLCALC, TRIG, CHOLHDL, LDLDIRECT in the last 72 hours. Thyroid Function Tests: No results for input(s): TSH, T4TOTAL, FREET4, T3FREE, THYROIDAB in the last 72 hours. Anemia Panel: No  results for input(s): VITAMINB12, FOLATE, FERRITIN, TIBC, IRON, RETICCTPCT in the last 72 hours. Sepsis Labs: Recent Labs  Lab 03/02/19 2201 03/02/19 2352  LATICACIDVEN 0.9 0.9    Recent Results (from the past 240 hour(s))  SARS Coronavirus 2 (Hosp order,Performed in [facility] lab via [facility] ID)     Status: None   Collection Time: 03/02/19 10:01 PM   Specimen: Dry Nasal Swab ([facility] ID Now)  Result Value Ref Range Status   SARS Coronavirus 2 ([facility] ID Now) NEGATIVE NEGATIVE Final    Comment: (NOTE) SARS-CoV-2 target nucleic acids are NOT DETECTED. The SARS-CoV-2 RNA is generally detectable in upper and lower respiratory specimens during the acute phase of infection.  Negativeresults do not preclude SARS-CoV-2 infection, do not rule out coinfections with other pathogens, and should not be  used as the  sole basis for treatment or other patient management decisions.  Negative results must be combined with clinical observations, patient history, and epidemiological information. The expected result is Negative. Fact Sheet for Patients: GolfingFamily.no Fact Sheet for Healthcare Providers: https://www.hernandez-brewer.com/ This test is not yet approved or cleared by the [place] FDA and  has been authorized for detection and/or diagnosis of SARS-CoV-2 by FDA under an Emergency Use Authorization (EUA).  This EUA will remain in effect (meaning this test can be used) for the duration of  the COVID19 declaration under Section 5 64(b)(1) of the Act, 21 U.S.C.  section [phone] 3(b)(1), unless the authorization is terminated or revoked sooner. Performed at [facility], [address], [place], [place] [address]   Surgical PCR screen     Status: Abnormal   Collection Time: 03/03/19  2:02 AM   Specimen: Nasal Mucosa; Nasal Swab  Result Value Ref Range Status   MRSA, PCR POSITIVE (Sharai Overbay) NEGATIVE Final    Comment: CRITICAL RESULT CALLED TO, READ BACK BY AND VERIFIED WITH: RN KNAPP AT 0413 03/03/19 CRUICKSHANK Azalynn Maxim    Staphylococcus aureus POSITIVE (Longino Trefz) NEGATIVE Final    Comment: (NOTE) The Xpert SA Assay (FDA approved for NASAL specimens in patients 60 years of age and older), is one component of Lynniah Janoski comprehensive surveillance program. It is not intended to diagnose infection nor to guide or monitor treatment. Performed at [facility], [place] [address], [place], [place] [address]          Radiology Studies: Dg Chest 2 View  Result Date: 03/02/2019 CLINICAL DATA:  Fever. Wound infection of left foot. EXAM: CHEST - 2 VIEW COMPARISON:  07/09/2017 FINDINGS: The cardiomediastinal contours are normal. The lungs are clear. Pulmonary vasculature is normal. No consolidation, pleural effusion, or pneumothorax. No acute osseous  abnormalities are seen. IMPRESSION: No acute chest findings. Electronically Signed   By: Keith Rake M.D.   On: 03/02/2019 22:31   Mr Foot Left W Wo Contrast  Result Date: 03/03/2019 CLINICAL DATA:  Diabetic patient with Berley Gambrell skin wound adjacent to the fifth metatarsal. EXAM: MRI OF THE LEFT FOREFOOT WITHOUT AND WITH CONTRAST TECHNIQUE: Multiplanar, multisequence MR imaging of the left forefoot was performed both before and after administration of intravenous contrast. CONTRAST:  10 cc Gadavist IV. COMPARISON:  Plain films left foot 03/02/2019 FINDINGS: Bones/Joint/Cartilage Healed fracture of the distal diaphysis of the fifth metatarsal is seen as on the prior plain films. There is marrow edema and enhancement throughout the fifth metatarsal consistent with osteomyelitis. There is no other marrow abnormality to suggest osteomyelitis. Loss of fat saturation in all the toes on T2 weighted  imaging is incidentally noted. Ligaments Intact. Muscles and Tendons Atrophy of intrinsic musculature the foot is identified. No intramuscular fluid collection is seen. Soft tissues Skin wound along the lateral margin of the distal diaphysis of the fifth metatarsal appears to extend to bone. No underlying abscess. Soft tissue edema and enhancement about the fifth metatarsal are identified. IMPRESSION: Edema and enhancement throughout the fifth metatarsal consistent with osteomyelitis. Skin ulceration and surrounding cellulitis along the distal diaphysis of the fifth metatarsal noted. Healed fracture distal fifth metatarsal as seen on prior plain films. Electronically Signed   By: Inge Rise M.D.   On: 03/03/2019 12:01   Dg Foot Complete Left  Result Date: 03/02/2019 CLINICAL DATA:  Lateral affection EXAM: LEFT FOOT - COMPLETE 3+ VIEW COMPARISON:  None. FINDINGS: There is soft tissue swelling about the fifth digit. There is an old fracture deformity of the fifth digit. There is in overlying ulcer. There is an  age-indeterminate deformity of the proximal phalanx of the fifth digit. There is soft tissue swelling about the forefoot. There is Anjanette Gilkey large plantar calcaneal spur. There are advanced degenerative changes of the first metatarsophalangeal joint. IMPRESSION: 1. No definite acute displaced fracture or dislocation. 2. Soft tissue ulcer at the level of the fifth metatarsal. 3. Old healed fifth metatarsal fracture. 4. Age-indeterminate fracture of the proximal phalanx of the fifth digit. 5. Advanced degenerative changes of the first metatarsophalangeal joint. Electronically Signed   By: Constance Holster M.D.   On: 03/02/2019 22:03   Vas [place] Abi With/wo Tbi  Result Date: 03/03/2019 LOWER EXTREMITY DOPPLER STUDY Indications: Ulceration. High Risk         Hypertension, hyperlipidemia, Diabetes, coronary artery Factors:          disease. Other Factors: History of right great toe amputation.  Comparison Study: No prior study. Performing Technologist: Maudry Mayhew MHA, RVT, RDCS, RDMS  Examination Guidelines: Giannah Zavadil complete evaluation includes at minimum, Doppler waveform signals and systolic blood pressure reading at the level of bilateral brachial, anterior tibial, and posterior tibial arteries, when vessel segments are accessible. Bilateral testing is considered an integral part of Ellenor Wisniewski complete examination. Photoelectric Plethysmograph (PPG) waveforms and toe systolic pressure readings are included as required and additional duplex testing as needed. Limited examinations for reoccurring indications may be performed as noted.  ABI Findings: +---------+------------------+-----+---------+----------+  Right     Rt Pressure (mmHg) Index Waveform  Comment     +---------+------------------+-----+---------+----------+  Brachial  156                      triphasic             +---------+------------------+-----+---------+----------+  PTA       142                0.91  triphasic              +---------+------------------+-----+---------+----------+  DP        150                0.96  triphasic             +---------+------------------+-----+---------+----------+  Great Toe                                    Amputation  +---------+------------------+-----+---------+----------+ +---------+------------------+-----+---------+-------+  Left      Lt Pressure (mmHg) Index Waveform  Comment  +---------+------------------+-----+---------+-------+  Brachial  155                      triphasic          +---------+------------------+-----+---------+-------+  PTA       185                1.19  triphasic          +---------+------------------+-----+---------+-------+  DP        181                1.16  triphasic          +---------+------------------+-----+---------+-------+  Great Toe 128                0.82                     +---------+------------------+-----+---------+-------+ +-------+-----------+-----------+------------+------------+  ABI/TBI Today's ABI Today's TBI Previous ABI Previous TBI  +-------+-----------+-----------+------------+------------+  Right   0.96        -                                      +-------+-----------+-----------+------------+------------+  Left    1.19        0.82                                   +-------+-----------+-----------+------------+------------+  Summary: Right: Resting right ankle-brachial index is within normal range. No evidence of significant right lower extremity arterial disease. Left: Resting left ankle-brachial index is within normal range. No evidence of significant left lower extremity arterial disease. The left toe-brachial index is normal.  *See table(s) above for measurements and observations.     Preliminary         Scheduled Meds:  amLODipine  10 mg Oral Daily   Chlorhexidine Gluconate Cloth  6 each Topical Daily   cloNIDine  0.3 mg Oral BID   collagenase  1 application Topical Daily   ezetimibe  10 mg Oral Daily   fenofibrate  160 mg  Oral Daily   heparin  5,000 Units Subcutaneous Q8H   lisinopril  20 mg Oral BID   And   hydrochlorothiazide  12.5 mg Oral BID   insulin aspart  0-5 Units Subcutaneous QHS   insulin aspart  0-9 Units Subcutaneous TID WC   levothyroxine  112 mcg Oral Daily   metoprolol succinate  100 mg Oral BID   mupirocin ointment  1 application Nasal BID   rosuvastatin  40 mg Oral Daily   Continuous Infusions:  cefTRIAXone (ROCEPHIN)  IV     metronidazole 500 mg (03/03/19 0936)   vancomycin 1,500 mg (03/03/19 1354)     LOS: 0 days    Time spent: over 35 min    Fayrene Helper, MD [facility] Pager AMION  If 7PM-7AM, please contact night-coverage [email or web address] Password [email or web address] 03/03/2019, 2:08 PM

## 2019-03-03 NOTE — H&P (Addendum)
History and Physical    Robert Huang CLE:[id number] DOB: July 03, 1961 DOA: 03/02/2019  Referring MD/NP/PA:   PCP: [name], MD   Patient coming from:  The patient is coming from home.  At baseline, pt is independent for most of ADL.        Chief Complaint: left foot wound infection  HPI: Robert Huang is a 58 y.o. male with medical history significant of hypertension, hyperlipidemia, diabetes mellitus, CAD, stent placement, hypothyroidism, gout, anxiety, who presents with left foot wound with infection.  Patient states that he has a wound in left lateral foot, which has been going on for 6 weeks. Pt was seen by PA physician in orthopedic surgeon, Dr. Almyra Free office on Wednesday.  Patient was started with Keflex.  He reports that he has been taking Keflex, but no significant help.  He developed fever and chills, with temperature 101.5 today.  Patient states that he has mild pain in left foot on walking.  No chest pain, shortness breath, cough.  He has nausea, no vomiting, diarrhea or abdominal pain.  No symptoms of UTI or unilateral weakness.  Patient states that he was scheduled for MRI of left foot to rule out osteomyelitis by orthopedic surgeon, but has not done yet.  ED Course: pt was found to have WBC 10.9, lactic acid 0.9, negative COVID-19 test ([facility]), potassium 3.3, renal function normal, temperature 99.2, heart rate 110, 87, oxygen saturation 96% on room air, blood pressure 137/68.  X-ray of left foot showed age-indeterminate fracture of the proximal phalanx of the fifth digit.  Chest x-ray negative.  Patient is placed on MedSurg bed for observation.    Review of Systems:   General: has fevers, chills, no body weight gain, has poor appetite, has fatigue HEENT: no blurry vision, hearing changes or sore throat Respiratory: no dyspnea, coughing, wheezing CV: no chest pain, no palpitations GI: has nausea, no vomiting, abdominal pain, diarrhea, constipation GU: no  dysuria, burning on urination, increased urinary frequency, hematuria  Ext: no leg edema Neuro: no unilateral weakness, numbness, or tingling, no vision change or hearing loss Skin: has ulcer in lateral side of left foot. MSK: No muscle spasm, no deformity, no limitation of range of movement in spin Heme: No easy bruising.  Travel history: No recent long distant travel.  Allergy: No Known Allergies  Past Medical History:  Diagnosis Date  . Anxiety   . CAD (coronary artery disease)    sees Dr. Stanford Breed, normal Stress test 07-16-12   . Diabetes mellitus   . DJD (degenerative joint disease)   . Gout   . Hyperlipidemia   . Hypertension   . Hypothyroid   . Osteoarthritis   . Renal calculus    hx  . Umbilical hernia     Past Surgical History:  Procedure Laterality Date  . AMPUTATION  07/17/2012   Procedure: AMPUTATION RAY;  Surgeon: Wylene Simmer, MD;  Location: [facility];  Service: Orthopedics;  Laterality: Right;  right hallux amputation (1st ray resection )  . CORONARY ANGIOPLASTY WITH STENT PLACEMENT    . FINGER SURGERY     left index finger  . I & D Toe  05/19/2012   rt great toe  . I&D EXTREMITY  05/19/2012   Procedure: IRRIGATION AND DEBRIDEMENT EXTREMITY;  Surgeon: Marin Shutter, MD;  Location: [facility];  Service: Orthopedics;  Laterality: Right;  Right Great toe  . TONSILLECTOMY      Social History:  reports that he has never  smoked. His smokeless tobacco use includes chew. He reports current alcohol use. He reports that he does not use drugs.  Family History:  Family History  Problem Relation Age of Onset  . Heart attack Mother   . Arthritis Other   . Coronary artery disease Other   . Diabetes Other   . Hypertension Other   . Prostate cancer Other   . Stroke Other      Prior to Admission medications   Medication Sig Start Date End Date Taking? Authorizing Provider  ALPRAZolam Duanne Moron) 1 MG tablet Take 1 tablet (1 mg total) by mouth 3 (three) times daily as needed for  anxiety. 07/19/18   [name], MD  amLODipine (NORVASC) 10 MG tablet Take 1 tablet (10 mg total) by mouth daily. <PLEASE MAKE APPOINTMENT FOR REFILLS> 10/09/18   [name], MD  Aspirin-Acetaminophen-Caffeine (GOODYS EXTRA STRENGTH) [phone] MG PACK Take 1 Package by mouth daily. Patient taking differently: Take 1 Package by mouth daily. Take additionally as needed during the day 07/06/15   [name], MD  cephALEXin (KEFLEX) 500 MG capsule Take 1 capsule (500 mg total) by mouth 3 (three) times daily. 01/14/19   Panosh, Standley Brooking, MD  cloNIDine (CATAPRES) 0.3 MG tablet TAKE 1 TABLET (0.3 MG TOTAL) BY MOUTH 2 (TWO) TIMES DAILY. NEED OV. 11/10/18   [name], MD  colchicine 0.6 MG tablet Take 1 tablet (0.6 mg total) by mouth every 6 (six) hours as needed (gout). Patient not taking: Reported on 03/02/2019 05/27/18   [name], MD  ezetimibe (ZETIA) 10 MG tablet Take 1 tablet (10 mg total) by mouth daily. 10/09/18 03/02/19  [name], MD  fenofibrate 160 MG tablet Take 1 tablet (160 mg total) by mouth daily. NEED OV. 10/09/18   [name], MD  glipiZIDE (GLUCOTROL) 10 MG tablet TAKE ONE TABLET BY MOUTH TWICE A DAY BEFORE A MEAL * NEED OFFICE VISIT FOR FUTURE REFILLS * Patient taking differently: Take 10 mg by mouth daily before breakfast.  03/06/18   [name], MD  glucose blood (ONE TOUCH ULTRA TEST) test strip 1 each by Other route daily. Use as instructed 06/11/18   [name], MD  HYDROcodone-acetaminophen (NORCO) 5-325 MG tablet Take 1 tablet by mouth every 6 (six) hours as needed for up to 30 days for moderate pain. 04/20/19 05/20/19  [name], MD  indomethacin (INDOCIN SR) 75 MG CR capsule Take 1 capsule (75 mg total) by mouth 2 (two) times daily with a meal. Patient taking differently: Take 75 mg by mouth as needed.  12/03/17   [name], MD  levothyroxine (SYNTHROID, LEVOTHROID) 112 MCG tablet TAKE 1 TABLET BY MOUTH EVERY DAY 11/10/18   [name], MD   lisinopril-hydrochlorothiazide (PRINZIDE,ZESTORETIC) 20-12.5 MG tablet Take 1 tablet by mouth 2 (two) times daily. 10AM AND 5PM 10/09/18   [name], MD  metFORMIN (GLUCOPHAGE) 1000 MG tablet TAKE 1 TABLET BY MOUTH TWO TIMES A DAY WITH A MEAL 10/09/18   [name], MD  metoprolol succinate (TOPROL-XL) 100 MG 24 hr tablet Take 1 tablet (100 mg total) by mouth 2 (two) times daily. 10/09/18   [name], MD  nitroGLYCERIN (NITROSTAT) 0.4 MG SL tablet Place 1 tablet (0.4 mg total) under the tongue every 5 (five) minutes as needed. For chest pain. 05/21/18   Lelon Perla, MD  rosuvastatin (CRESTOR) 40 MG tablet Take 1 tablet (40 mg total)  by mouth daily. <PLEASE MAKE APPOINTMENT FOR REFILLS> 10/09/18   [name], MD  sitaGLIPtin (JANUVIA) 100 MG tablet Take 1 tablet (100 mg total) by mouth daily. Patient not taking: Reported on 03/02/2019 10/13/18   [name], MD  zolpidem (AMBIEN) 10 MG tablet TAKE 1 TABLET BY MOUTH EVERYDAY AT BEDTIME Patient not taking: Reported on 03/02/2019 09/22/18   [name], MD    Physical Exam: Vitals:   03/02/19 2047 03/02/19 2048 03/02/19 2315 03/03/19 0123  BP:  (!) 175/89 137/68 121/63  Pulse:  (!) 110 87 66  Resp:  _0 Temp:  99.2 F (37.3 C) 99 F (37.2 C) 98.8 F (37.1 C)  TempSrc:  Oral Oral Oral  SpO2:  98% 96% 100%  Weight: 96.2 kg     Height: 6' (1.829 m)      General: Not in acute distress HEENT:       Eyes: PERRL, EOMI, no scleral icterus.       ENT: No discharge from the ears and nose, no pharynx injection, no tonsillar enlargement.        Neck: No JVD, no bruit, no mass felt. Heme: No neck lymph node enlargement. Cardiac: S1/S2, RRR, No murmurs, No gallops or rubs. Respiratory:  No rales, wheezing, rhonchi or rubs. GI: Soft, nondistended, nontender, no rebound pain, no organomegaly, BS present. GU: No hematuria Ext: No pitting leg edema bilaterally. 2+DP/PT pulse bilaterally. Previous amputation of right great toe.   Musculoskeletal: No joint deformities, No joint redness or warmth, no limitation of ROM in spin. Skin: has an ulcer in the lateral left foot, with some surrounding erythema.  Neuro: Alert, oriented X3, cranial nerves II-XII grossly intact, moves all extremities normally. Psych: Patient is not psychotic, no suicidal or hemocidal ideation.  Labs on Admission: I have personally reviewed following labs and imaging studies  CBC: Recent Labs  Lab 03/02/19 2201  WBC 10.9*  NEUTROABS 9.4*  HGB 11.6*  HCT 35.6*  MCV 93.2  PLT 676   Basic Metabolic Panel: Recent Labs  Lab 03/02/19 2201  NA 135  K 3.3*  CL 106  CO2 20*  GLUCOSE 203*  BUN 15  CREATININE 0.89  CALCIUM 8.7*   GFR: Estimated Creatinine Clearance: 107.4 mL/min (by C-G formula based on SCr of 0.89 mg/dL). Liver Function Tests: Recent Labs  Lab 03/02/19 2201  AST 14*  ALT 15  ALKPHOS 69  BILITOT 0.5  PROT 6.9  ALBUMIN 3.3*   No results for input(s): LIPASE, AMYLASE in the last 168 hours. No results for input(s): AMMONIA in the last 168 hours. Coagulation Profile: No results for input(s): INR, PROTIME in the last 168 hours. Cardiac Enzymes: No results for input(s): CKTOTAL, CKMB, CKMBINDEX, TROPONINI in the last 168 hours. BNP (last 3 results) No results for input(s): PROBNP in the last 8760 hours. HbA1C: No results for input(s): HGBA1C in the last 72 hours. CBG: No results for input(s): GLUCAP in the last 168 hours. Lipid Profile: No results for input(s): CHOL, HDL, LDLCALC, TRIG, CHOLHDL, LDLDIRECT in the last 72 hours. Thyroid Function Tests: No results for input(s): TSH, T4TOTAL, FREET4, T3FREE, THYROIDAB in the last 72 hours. Anemia Panel: No results for input(s): VITAMINB12, FOLATE, FERRITIN, TIBC, IRON, RETICCTPCT in the last 72 hours. Urine analysis:    Component Value Date/Time   COLORURINE YELLOW 03/02/2019 2204   APPEARANCEUR CLEAR 03/02/2019 2204   LABSPEC 1.025 03/02/2019 2204   PHURINE  5.5 03/02/2019 2204  GLUCOSEU NEGATIVE 03/02/2019 [place] 03/02/2019 2204   HGBUR negative 04/16/2007 [place] 03/02/2019 2204   BILIRUBINUR neg 10/03/2018 [place] 03/02/2019 2204   PROTEINUR NEGATIVE 03/02/2019 2204   UROBILINOGEN 0.2 10/03/2018 1313   UROBILINOGEN negative 04/16/2007 0904   NITRITE NEGATIVE 03/02/2019 [place] 03/02/2019 2204   Sepsis Labs: _0 (procalcitonin:4,lacticidven:4) ) Recent Results (from the past 240 hour(s))  SARS Coronavirus 2 (Hosp order,Performed in [place] lab via [facility] ID)     Status: None   Collection Time: 03/02/19 10:01 PM   Specimen: Dry Nasal Swab ([facility] Now)  Result Value Ref Range Status   SARS Coronavirus 2 ([facility] Now) NEGATIVE NEGATIVE Final    Comment: (NOTE) SARS-CoV-2 target nucleic acids are NOT DETECTED. The SARS-CoV-2 RNA is generally detectable in upper and lower respiratory specimens during the acute phase of infection.  Negativeresults do not preclude SARS-CoV-2 infection, do not rule out coinfections with other pathogens, and should not be used as the  sole basis for treatment or other patient management decisions.  Negative results must be combined with clinical observations, patient history, and epidemiological information. The expected result is Negative. Fact Sheet for Patients: GolfingFamily.no Fact Sheet for Healthcare Providers: https://www.hernandez-brewer.com/ This test is not yet approved or cleared by the [place] FDA and  has been authorized for detection and/or diagnosis of SARS-CoV-2 by FDA under an Emergency Use Authorization (EUA).  This EUA will remain in effect (meaning this test can be used) for the duration of  the COVID19 declaration under Section 5 64(b)(1) of the Act, 21 U.S.C.  section [phone] 3(b)(1), unless the authorization is terminated or revoked sooner. Performed  at [facility], [address] [place], [place] [address]      Radiological Exams on Admission: Dg Chest 2 View  Result Date: 03/02/2019 CLINICAL DATA:  Fever. Wound infection of left foot. EXAM: CHEST - 2 VIEW COMPARISON:  07/09/2017 FINDINGS: The cardiomediastinal contours are normal. The lungs are clear. Pulmonary vasculature is normal. No consolidation, pleural effusion, or pneumothorax. No acute osseous abnormalities are seen. IMPRESSION: No acute chest findings. Electronically Signed   By: Keith Rake M.D.   On: 03/02/2019 22:31   Dg Foot Complete Left  Result Date: 03/02/2019 CLINICAL DATA:  Lateral affection EXAM: LEFT FOOT - COMPLETE 3+ VIEW COMPARISON:  None. FINDINGS: There is soft tissue swelling about the fifth digit. There is an old fracture deformity of the fifth digit. There is in overlying ulcer. There is an age-indeterminate deformity of the proximal phalanx of the fifth digit. There is soft tissue swelling about the forefoot. There is a large plantar calcaneal spur. There are advanced degenerative changes of the first metatarsophalangeal joint. IMPRESSION: 1. No definite acute displaced fracture or dislocation. 2. Soft tissue ulcer at the level of the fifth metatarsal. 3. Old healed fifth metatarsal fracture. 4. Age-indeterminate fracture of the proximal phalanx of the fifth digit. 5. Advanced degenerative changes of the first metatarsophalangeal joint. Electronically Signed   By: Constance Holster M.D.   On: 03/02/2019 22:03     EKG:  Not done in ED, will get one.   Assessment/Plan Principal Problem:   Left foot infection Active Problems:   Hypothyroidism   Dyslipidemia   Gout   Anxiety state   Essential hypertension   Coronary atherosclerosis   Diabetes mellitus with complication (HCC)   Diabetic foot ulcers (HCC)   Hypokalemia  Left foot diabetic foot ulcers with infection: pt developed fever of 101.5 and chills.  Has mild leukocytosis with WBC  10.9.  Currently hemodynamically stable. Pt failed outpatient antibiotic of Keflex treatment.  - will place on tele bed for obs - Empiric antimicrobial treatment with vancomycin, Rocephin - PRN Zofran for nausea, Norco for pain - Blood cultures x 2  - ESR and CRP - wound care consult - MRI-left foot -->if shows osteomyelitis, will need to consult ortho in AM -Keep pt NPO pending MRI of left foot (if positive for Osteo, may needs surgery).  Hypothyroidism: -Synthroid  Dyslipidemia: -Crestor, Zetia, fenofibrate-  Gout: -prn Indocin  Anxiety state: was on Xanax. No anxiety today -pt states that he dose not need Xanax today  Essential hypertension: -IV hydralazine as needed -Continue home amlodipine, clonidine, Prinzide, metoprolol  Coronary atherosclerosis: s/p of stent. No CP. -continue Crestor, Zetia, fenofibrate metoprolol, PRN nitroglycerin  Diabetes mellitus with complication, with diabetic foot ulcer: Last A1c 8.0 on 10/03/18, poorly controled. Patient is taking glipizide and metformin at home -SSI  Hypokalemia: K=3.3 on admission. - Repleted - Check Mg level   DVT ppx: SQ Heparin    Code Status: Full code Family Communication: None at bed side.          Disposition Plan:  Anticipate discharge back to previous home environment Consults called:  none Admission status:   medical floor/obs    :   Date of Service 03/03/2019    [facility]   If 7PM-7AM, please contact night-coverage [email or web address] Password [email or web address] 03/03/2019, 1:28 AM

## 2019-03-03 NOTE — ED Provider Notes (Signed)
Admitted to hospitalist service for MRI and rule out of osteomyelitis.   Fatima Blank, MD 03/03/19 [phone]

## 2019-03-03 NOTE — Consult Note (Signed)
[facility] Nurse wound consult note Reason for Consult:Left lateral foot wound, present x 6 weeks with no improvement.  Patient having ABI this AM to rule out arterial disease. Will make topical recommendations now.   Wound type:Nonhealing neuropathic wound Pressure Injury POA: NA Measurement:2.5 cm x 2 cm x 0.3 cm  Wound MBW:GYKZL red Drainage (amount, consistency, odor) minimal serosanguinous  No odor. Periwound:erythema Dressing procedure/placement/frequency:Cleanse wound to left lateral foot with NS and pat dry.  Apply Santyl to wound bed. Cover with NS moist 2x2.  Secure with dry dressing and kerlix/tape  Change Daily.  Will not follow at this time.  Please re-consult if needed.  Domenic Moras MSN, RN, FNP-BC CWON Wound, Ostomy, Continence Nurse Pager [phone]

## 2019-03-03 NOTE — Plan of Care (Signed)
Plan of care discussed.   

## 2019-03-04 LAB — COMPREHENSIVE METABOLIC PANEL
ALT: 15 U/L (ref 0–44)
AST: 16 U/L (ref 15–41)
Albumin: 3 g/dL — ABNORMAL LOW (ref 3.5–5.0)
Alkaline Phosphatase: 53 U/L (ref 38–126)
Anion gap: 6 (ref 5–15)
BUN: 10 mg/dL (ref 6–20)
CO2: 27 mmol/L (ref 22–32)
Calcium: 8.6 mg/dL — ABNORMAL LOW (ref 8.9–10.3)
Chloride: 109 mmol/L (ref 98–111)
Creatinine, Ser: 0.9 mg/dL (ref 0.61–1.24)
GFR calc Af Amer: >60 mL/min (ref >60)
GFR calc non Af Amer: >60 mL/min (ref >60)
Glucose, Bld: 172 mg/dL — ABNORMAL HIGH (ref 70–99)
Potassium: 3.4 mmol/L — ABNORMAL LOW (ref 3.5–5.1)
Sodium: 142 mmol/L (ref 135–145)
Total Bilirubin: 0.5 mg/dL (ref 0.3–1.2)
Total Protein: 6.8 g/dL (ref 6.5–8.1)

## 2019-03-04 LAB — GLUCOSE, CAPILLARY
Glucose-Capillary: 151 mg/dL — ABNORMAL HIGH (ref 70–99)
Glucose-Capillary: 171 mg/dL — ABNORMAL HIGH (ref 70–99)
Glucose-Capillary: 154 mg/dL — ABNORMAL HIGH (ref 70–99)
Glucose-Capillary: 177 mg/dL — ABNORMAL HIGH (ref 70–99)
Glucose-Capillary: 149 mg/dL — ABNORMAL HIGH (ref 70–99)

## 2019-03-04 LAB — CBC
HCT: 34.4 % — ABNORMAL LOW (ref 39.0–52.0)
Hemoglobin: 10.4 g/dL — ABNORMAL LOW (ref 13.0–17.0)
MCH: 29.5 pg (ref 26.0–34.0)
MCHC: 30.2 g/dL (ref 30.0–36.0)
MCV: 97.7 fL (ref 80.0–100.0)
Platelets: 258 10*3/uL (ref 150–400)
RBC: 3.52 MIL/uL — ABNORMAL LOW (ref 4.22–5.81)
RDW: 12.6 % (ref 11.5–15.5)
WBC: 5.3 10*3/uL (ref 4.0–10.5)
nRBC: 0 % (ref 0.0–0.2)

## 2019-03-04 LAB — MAGNESIUM: Magnesium: 1.6 mg/dL — ABNORMAL LOW (ref 1.7–2.4)

## 2019-03-04 MED ORDER — MAGNESIUM OXIDE 400 (241.3 MG) MG PO TABS
400.0000 mg | ORAL_TABLET | Freq: Every day | ORAL | Status: DC
  Administered 2019-03-04: 400 mg via ORAL
  Filled 2019-03-04: qty 1

## 2019-03-04 NOTE — Progress Notes (Signed)
   Subjective:  Patient reports pain as mild.  Denies fevers, night sweats or chills.  No chest pain or shortness of breath.  Objective:   VITALS:   Vitals:   03/03/19 2150 03/04/19 0508 03/04/19 1031 03/04/19 1326  BP: (!) 145/75 (!) 145/75  128/78  Pulse: 65 (!) 59 64 (!) 53  Resp: 20 18  15   Temp: 98.9 F (37.2 C) 98.2 F (36.8 C)  98.3 F (36.8 C)  TempSrc: Oral Oral    SpO2: 100% 98%  100%  Weight:      Height:        Left foot examined: He has a dime sized open wound with granulation tissue around the periphery.  This does have a track traveling towards the fifth metatarsal head along the fifth ray.  There is no purulent drainage and no fluctuance.  Mild surrounding erythema.  He has some tenderness when I palpate along the metatarsal.  Otherwise he has bounding 2+ ulcers.  He endorses sensation in the tibial, deep superficial peroneal nerves and sural nerve.  It is decreased in the superficial peroneal.   Lab Results  Component Value Date   WBC 5.3 03/04/2019   HGB 10.4 (L) 03/04/2019   HCT 34.4 (L) 03/04/2019   MCV 97.7 03/04/2019   PLT 258 03/04/2019   BMET    Component Value Date/Time   NA 142 03/04/2019 0338   K 3.4 (L) 03/04/2019 0338   CL 109 03/04/2019 0338   CO2 27 03/04/2019 0338   GLUCOSE 172 (H) 03/04/2019 0338   BUN 10 03/04/2019 0338   CREATININE 0.90 03/04/2019 0338   CALCIUM 8.6 (L) 03/04/2019 0338   GFRNONAA >60 03/04/2019 0338   GFRAA >60 03/04/2019 0338     Assessment/Plan:     Principal Problem:   Left foot infection Active Problems:   Hypothyroidism   Dyslipidemia   Gout   Anxiety state   Essential hypertension   Coronary atherosclerosis   Diabetes mellitus with complication (HCC)   Diabetic foot ulcers (HCC)   Hypokalemia  -I had a lengthy conversation with Mr. Belsito.  He would like to pursue continuation of conservative/medical management for this osteomyelitis with sinus tract.  I think that is reasonable as he does  have some granulation tissue noted around the wound orifice and no noted abscess on his MRI.  He has a follow-up appointment next week with Dr. Doran Durand and he will keep that.  In the meanwhile we will do twice daily wet-to-dry dressing changes with 6 weeks of oral antibiotics for his osteomyelitis.  -He may continue with weightbearing as tolerated to the left foot.  -I will send his narcotic prescription in from our office EMR.  That will be waiting for him when he discharges.  Otherwise he will follow-up with Dr. Doran Durand next week on Wednesday, July 15.   Nicholes Stairs 03/04/2019, 3:15 PM   Geralynn Rile, MD [phone]

## 2019-03-04 NOTE — Plan of Care (Signed)

## 2019-03-04 NOTE — Progress Notes (Addendum)
Robert Huang Kitchen  PROGRESS NOTE    Robert Huang  PYP:[id number] DOB: 1961/08/07 DOA: 03/02/2019 PCP: [name], MD   Brief Narrative:   Robert Huang a 58 y.o.malewith medical history significant ofhypertension, hyperlipidemia, diabetes mellitus, CAD, stent placement, hypothyroidism, gout, anxiety, who presents with left foot wound with infection.  Patient states that he has a wound in left lateral foot, which has been going on for 6 weeks.Pt was seen by PAphysicianinorthopedic surgeon, Dr. Eustace Huang onWednesday. Patient was started with Keflex.He reports that he has been taking Keflex,but no significant help. He developed fever and chills, with temperature 101.5today. Patient states that he has mildpain in left foot on walking.No chest pain, shortness breath, cough. He has nausea, no vomiting, diarrhea or abdominal pain. No symptoms of UTI or unilateral weakness. Patient states that he was scheduled for MRI of left foot to rule out osteomyelitis by orthopedic surgeon, but has not done yet.   Assessment & Plan:   Principal Problem:   Left foot infection Active Problems:   Hypothyroidism   Dyslipidemia   Gout   Anxiety state   Essential hypertension   Coronary atherosclerosis   Diabetes mellitus with complication (HCC)   Diabetic foot ulcers (HCC)   Hypokalemia   Left footdiabetic foot ulcerswithinfection     - ptdeveloped fever of101.5 and chills; mild leukocytosis with WBC 10.9.     - Pt failedoutpatient antibiotic of Keflex treatment.     - IV abx with vanc/ceftriaxone/flagyl     - MRI with evidence of osteomyelitis throughout 5th metatarsal with skin ulceration and surrounding cellulitis noted as well     - ABI's wnl (preliminary study)     - PRN Zofran for nausea,Norcofor pain     - Bld Cx NTD     - ESR (50) and CRP (2.9)     - wound care consult, appreciate recs     - ortho consulted, appreciate assistance  Hypothyroidism     -  Synthroid  Dyslipidemia:     - Crestor,Zetia, fenofibrate  Gout:     - prn Indocin  Anxiety state     - was on PRN xanax; denies complaint today  Essential hypertension     - Continue home amlodipine, clonidine, Prinzide, metoprolol  Coronary atherosclerosis     - s/p of stent. No CP.     - continueCrestor, Zetia, fenofibrate metoprolol, PRN nitroglycerin  Diabetes mellitus with complication, withdiabetic foot ulcer     - Last A1c8.0 on 10/03/18, poorly controled. Patient is takingglipizide and metforminat home     - SSI  Hypokalemia     - K+ was 3.3on admission.     - Repleted     - Mg2+ is 1.6; monitor  Appreciate ortho help. Spoke with patient. He is willing to have IR Bx bone. Have ordered. Will need PICC at discharge.   DVT prophylaxis: heparin Code Status: FULL   Disposition Plan: TBD   Consultants:   Orthopedics  Antimicrobials:   Rocephin, flagyl, vanc    Subjective: "I'm hungry."  Objective: Vitals:   03/03/19 2150 03/04/19 0508 03/04/19 1031 03/04/19 1326  BP: (!) 145/75 (!) 145/75  128/78  Pulse: 65 (!) 59 64 (!) 53  Resp: 20 18  15   Temp: 98.9 F (37.2 C) 98.2 F (36.8 C)  98.3 F (36.8 C)  TempSrc: Oral Oral    SpO2: 100% 98%  100%  Weight:      Height:  Intake/Output Summary (Last 24 hours) at 03/04/2019 1352 Last data filed at 03/04/2019 1216 Gross per 24 hour  Intake 1690.16 ml  Output 2600 ml  Net -909.84 ml   Filed Weights   03/02/19 2047 03/03/19 0130  Weight: 96.2 kg 94.3 kg    Examination:  General: 58 y.o. male resting in bed in NAD Cardiovascular: RRR, +S1, S2, no m/g/r, equal pulses throughout Respiratory: CTABL, no w/r/r, normal WOB GI: BS+, NDNT, no masses noted, no organomegaly noted MSK: No e/c/c; LLE bandaging C/D/I Neuro: A&O x 3, no focal deficits     Data Reviewed: I have personally reviewed following labs and imaging studies.  CBC: Recent Labs  Lab 03/02/19 2201 03/03/19 0311  03/04/19 0338  WBC 10.9* 8.3 5.3  NEUTROABS 9.4*  --   --   HGB 11.6* 10.6* 10.4*  HCT 35.6* 34.1* 34.4*  MCV 93.2 95.3 97.7  PLT 283 273 837   Basic Metabolic Panel: Recent Labs  Lab 03/02/19 2201 03/03/19 0311 03/04/19 0338  NA 135 140 142  K 3.3* 3.7 3.4*  CL 106 108 109  CO2 20* 26 27  GLUCOSE 203* 131* 172*  BUN 15 13 10   CREATININE 0.89 0.87 0.90  CALCIUM 8.7* 8.9 8.6*  MG  --  1.4* 1.6*   GFR: Estimated Creatinine Clearance: 105.4 mL/min (by C-G formula based on SCr of 0.9 mg/dL). Liver Function Tests: Recent Labs  Lab 03/02/19 2201 03/04/19 0338  AST 14* 16  ALT 15 15  ALKPHOS 69 53  BILITOT 0.5 0.5  PROT 6.9 6.8  ALBUMIN 3.3* 3.0*   No results for input(s): LIPASE, AMYLASE in the last 168 hours. No results for input(s): AMMONIA in the last 168 hours. Coagulation Profile: Recent Labs  Lab 03/03/19 0311  INR 1.1   Cardiac Enzymes: No results for input(s): CKTOTAL, CKMB, CKMBINDEX, TROPONINI in the last 168 hours. BNP (last 3 results) No results for input(s): PROBNP in the last 8760 hours. HbA1C: No results for input(s): HGBA1C in the last 72 hours. CBG: Recent Labs  Lab 03/03/19 1620 03/03/19 2151 03/04/19 0722 03/04/19 1131 03/04/19 1324  GLUCAP 226* 115* 151* 171* 154*   Lipid Profile: No results for input(s): CHOL, HDL, LDLCALC, TRIG, CHOLHDL, LDLDIRECT in the last 72 hours. Thyroid Function Tests: No results for input(s): TSH, T4TOTAL, FREET4, T3FREE, THYROIDAB in the last 72 hours. Anemia Panel: No results for input(s): VITAMINB12, FOLATE, FERRITIN, TIBC, IRON, RETICCTPCT in the last 72 hours. Sepsis Labs: Recent Labs  Lab 03/02/19 2201 03/02/19 2352  LATICACIDVEN 0.9 0.9    Recent Results (from the past 240 hour(s))  Culture, blood (routine x 2)     Status: None (Preliminary result)   Collection Time: 03/02/19 10:01 PM   Specimen: BLOOD LEFT FOREARM  Result Value Ref Range Status   Specimen Description   Final    BLOOD  LEFT FOREARM Performed at [facility], [address] [place], [place] [address]    Special Requests   Final    BOTTLES DRAWN AEROBIC AND ANAEROBIC Blood Culture adequate volume Performed at [facility], [address], [place], [place] [address]    Culture   Final    NO GROWTH 1 DAY Performed at [facility], [place] [address] [place], [place] [address]    Report Status PENDING  Incomplete  SARS Coronavirus 2 (Hosp order,Performed in [facility] lab via Abbott ID)     Status: None   Collection  Time: 03/02/19 10:01 PM   Specimen: Dry Nasal Swab (Abbott ID Now)  Result Value Ref Range Status   SARS Coronavirus 2 (Abbott ID Now) NEGATIVE NEGATIVE Final    Comment: (NOTE) SARS-CoV-2 target nucleic acids are NOT DETECTED. The SARS-CoV-2 RNA is generally detectable in upper and lower respiratory specimens during the acute phase of infection.  Negativeresults do not preclude SARS-CoV-2 infection, do not rule out coinfections with other pathogens, and should not be used as the  sole basis for treatment or other patient management decisions.  Negative results must be combined with clinical observations, patient history, and epidemiological information. The expected result is Negative. Fact Sheet for Patients: GolfingFamily.no Fact Sheet for Healthcare Providers: https://www.hernandez-brewer.com/ This test is not yet approved or cleared by the [place] FDA and  has been authorized for detection and/or diagnosis of SARS-CoV-2 by FDA under an Emergency Use Authorization (EUA).  This EUA will remain in effect (meaning this test can be used) for the duration of  the COVID19 declaration under Section 5 64(b)(1) of the Act, 21 U.S.C.  section 3077684938 3(b)(1), unless the authorization is terminated or revoked sooner. Performed at [facility], [address] [place], [place] [address]   Culture, blood (routine x  2)     Status: None (Preliminary result)   Collection Time: 03/02/19 10:04 PM   Specimen: BLOOD LEFT HAND  Result Value Ref Range Status   Specimen Description   Final    BLOOD LEFT HAND Performed at [facility], [address] [place], [place] [address]    Special Requests   Final    BOTTLES DRAWN AEROBIC AND ANAEROBIC Blood Culture results may not be optimal due to an inadequate volume of blood received in culture bottles Performed at [facility], [address] [place], [place] [address]    Culture   Final    NO GROWTH 1 DAY Performed at [facility], [place] [address], [place], [place] [address]    Report Status PENDING  Incomplete  Surgical PCR screen     Status: Abnormal   Collection Time: 03/03/19  2:02 AM   Specimen: Nasal Mucosa; Nasal Swab  Result Value Ref Range Status   MRSA, PCR POSITIVE (A) NEGATIVE Final    Comment: CRITICAL RESULT CALLED TO, READ BACK BY AND VERIFIED WITH: RN KNAPP AT 0413 03/03/19 CRUICKSHANK A    Staphylococcus aureus POSITIVE (A) NEGATIVE Final    Comment: (NOTE) The Xpert SA Assay (FDA approved for NASAL specimens in patients 75 years of age and older), is one component of a comprehensive surveillance program. It is not intended to diagnose infection nor to guide or monitor treatment. Performed at [facility], [place] [address], [place], [place] [address]          Radiology Studies: Dg Chest 2 View  Result Date: 03/02/2019 CLINICAL DATA:  Fever. Wound infection of left foot. EXAM: CHEST - 2 VIEW COMPARISON:  07/09/2017 FINDINGS: The cardiomediastinal contours are normal. The lungs are clear. Pulmonary vasculature is normal. No consolidation, pleural effusion, or pneumothorax. No acute osseous abnormalities are seen. IMPRESSION: No acute chest findings. Electronically Signed   By: Keith Rake M.D.   On: 03/02/2019 22:31   Mr Foot Left W Wo Contrast  Result Date: 03/03/2019 CLINICAL DATA:   Diabetic patient with a skin wound adjacent to the fifth metatarsal. EXAM: MRI OF THE LEFT FOREFOOT WITHOUT AND WITH CONTRAST TECHNIQUE: Multiplanar,  multisequence MR imaging of the left forefoot was performed both before and after administration of intravenous contrast. CONTRAST:  10 cc Gadavist IV. COMPARISON:  Plain films left foot 03/02/2019 FINDINGS: Bones/Joint/Cartilage Healed fracture of the distal diaphysis of the fifth metatarsal is seen as on the prior plain films. There is marrow edema and enhancement throughout the fifth metatarsal consistent with osteomyelitis. There is no other marrow abnormality to suggest osteomyelitis. Loss of fat saturation in all the toes on T2 weighted imaging is incidentally noted. Ligaments Intact. Muscles and Tendons Atrophy of intrinsic musculature the foot is identified. No intramuscular fluid collection is seen. Soft tissues Skin wound along the lateral margin of the distal diaphysis of the fifth metatarsal appears to extend to bone. No underlying abscess. Soft tissue edema and enhancement about the fifth metatarsal are identified. IMPRESSION: Edema and enhancement throughout the fifth metatarsal consistent with osteomyelitis. Skin ulceration and surrounding cellulitis along the distal diaphysis of the fifth metatarsal noted. Healed fracture distal fifth metatarsal as seen on prior plain films. Electronically Signed   By: Inge Rise M.D.   On: 03/03/2019 12:01   Dg Foot Complete Left  Result Date: 03/02/2019 CLINICAL DATA:  Lateral affection EXAM: LEFT FOOT - COMPLETE 3+ VIEW COMPARISON:  None. FINDINGS: There is soft tissue swelling about the fifth digit. There is an old fracture deformity of the fifth digit. There is in overlying ulcer. There is an age-indeterminate deformity of the proximal phalanx of the fifth digit. There is soft tissue swelling about the forefoot. There is a large plantar calcaneal spur. There are advanced degenerative changes of the first  metatarsophalangeal joint. IMPRESSION: 1. No definite acute displaced fracture or dislocation. 2. Soft tissue ulcer at the level of the fifth metatarsal. 3. Old healed fifth metatarsal fracture. 4. Age-indeterminate fracture of the proximal phalanx of the fifth digit. 5. Advanced degenerative changes of the first metatarsophalangeal joint. Electronically Signed   By: Constance Holster M.D.   On: 03/02/2019 22:03   Vas [place] Abi With/wo Tbi  Result Date: 03/03/2019 LOWER EXTREMITY DOPPLER STUDY Indications: Ulceration. High Risk         Hypertension, hyperlipidemia, Diabetes, coronary artery Factors:          disease. Other Factors: History of right great toe amputation.  Comparison Study: No prior study. Performing Technologist: Maudry Mayhew MHA, RVT, RDCS, RDMS  Examination Guidelines: A complete evaluation includes at minimum, Doppler waveform signals and systolic blood pressure reading at the level of bilateral brachial, anterior tibial, and posterior tibial arteries, when vessel segments are accessible. Bilateral testing is considered an integral part of a complete examination. Photoelectric Plethysmograph (PPG) waveforms and toe systolic pressure readings are included as required and additional duplex testing as needed. Limited examinations for reoccurring indications may be performed as noted.  ABI Findings: +---------+------------------+-----+---------+----------+  Right     Rt Pressure (mmHg) Index Waveform  Comment     +---------+------------------+-----+---------+----------+  Brachial  156                      triphasic             +---------+------------------+-----+---------+----------+  PTA       142                0.91  triphasic             +---------+------------------+-----+---------+----------+  DP        150  0.96  triphasic             +---------+------------------+-----+---------+----------+  Great Toe                                    Amputation   +---------+------------------+-----+---------+----------+ +---------+------------------+-----+---------+-------+  Left      Lt Pressure (mmHg) Index Waveform  Comment  +---------+------------------+-----+---------+-------+  Brachial  155                      triphasic          +---------+------------------+-----+---------+-------+  PTA       185                1.19  triphasic          +---------+------------------+-----+---------+-------+  DP        181                1.16  triphasic          +---------+------------------+-----+---------+-------+  Great Toe 128                0.82                     +---------+------------------+-----+---------+-------+ +-------+-----------+-----------+------------+------------+  ABI/TBI Today's ABI Today's TBI Previous ABI Previous TBI  +-------+-----------+-----------+------------+------------+  Right   0.96        -                                      +-------+-----------+-----------+------------+------------+  Left    1.19        0.82                                   +-------+-----------+-----------+------------+------------+  Summary: Right: Resting right ankle-brachial index is within normal range. No evidence of significant right lower extremity arterial disease. Left: Resting left ankle-brachial index is within normal range. No evidence of significant left lower extremity arterial disease. The left toe-brachial index is normal.  *See table(s) above for measurements and observations.  Electronically signed by Deitra Mayo MD on 03/03/2019 at 5:04:18 PM.    Final         Scheduled Meds:  amLODipine  10 mg Oral Daily   Chlorhexidine Gluconate Cloth  6 each Topical Daily   cloNIDine  0.3 mg Oral BID   collagenase  1 application Topical Daily   ezetimibe  10 mg Oral Daily   fenofibrate  160 mg Oral Daily   heparin  5,000 Units Subcutaneous Q8H   lisinopril  20 mg Oral BID   And   hydrochlorothiazide  12.5 mg Oral BID   insulin aspart  0-5 Units  Subcutaneous QHS   insulin aspart  0-9 Units Subcutaneous TID WC   levothyroxine  112 mcg Oral Daily   metoprolol succinate  100 mg Oral BID   mupirocin ointment  1 application Nasal BID   rosuvastatin  40 mg Oral Daily   Continuous Infusions:  cefTRIAXone (ROCEPHIN)  IV Stopped (03/03/19 1651)   metronidazole 500 mg (03/04/19 0820)   vancomycin 1,500 mg (03/04/19 1340)     LOS: 1 day    Time spent: 25 minutes spent in the coordination of care today.    Reene Harlacher A  Marylyn Ishihara, DO [facility] Pager [phone]  If 7PM-7AM, please contact night-coverage [email or web address] Password [email or web address] 03/04/2019, 1:52 PM

## 2019-03-05 LAB — CBC WITH DIFFERENTIAL/PLATELET
Abs Immature Granulocytes: 0.07 10*3/uL (ref 0.00–0.07)
Basophils Absolute: 0 10*3/uL (ref 0.0–0.1)
Basophils Relative: 1 %
Eosinophils Absolute: 0.2 10*3/uL (ref 0.0–0.5)
Eosinophils Relative: 4 %
HCT: 37.6 % — ABNORMAL LOW (ref 39.0–52.0)
Hemoglobin: 11.2 g/dL — ABNORMAL LOW (ref 13.0–17.0)
Immature Granulocytes: 1 %
Lymphocytes Relative: 13 %
Lymphs Abs: 0.8 10*3/uL (ref 0.7–4.0)
MCH: 29 pg (ref 26.0–34.0)
MCHC: 29.8 g/dL — ABNORMAL LOW (ref 30.0–36.0)
MCV: 97.4 fL (ref 80.0–100.0)
Monocytes Absolute: 0.8 10*3/uL (ref 0.1–1.0)
Monocytes Relative: 14 %
Neutro Abs: 4.2 10*3/uL (ref 1.7–7.7)
Neutrophils Relative %: 67 %
Platelets: 282 10*3/uL (ref 150–400)
RBC: 3.86 MIL/uL — ABNORMAL LOW (ref 4.22–5.81)
RDW: 12.6 % (ref 11.5–15.5)
WBC: 6.2 10*3/uL (ref 4.0–10.5)
nRBC: 0 % (ref 0.0–0.2)

## 2019-03-05 LAB — RENAL FUNCTION PANEL
Albumin: 3.2 g/dL — ABNORMAL LOW (ref 3.5–5.0)
Anion gap: 11 (ref 5–15)
BUN: 9 mg/dL (ref 6–20)
CO2: 26 mmol/L (ref 22–32)
Calcium: 8.7 mg/dL — ABNORMAL LOW (ref 8.9–10.3)
Chloride: 102 mmol/L (ref 98–111)
Creatinine, Ser: 1.01 mg/dL (ref 0.61–1.24)
GFR calc Af Amer: >60 mL/min (ref >60)
GFR calc non Af Amer: >60 mL/min (ref >60)
Glucose, Bld: 216 mg/dL — ABNORMAL HIGH (ref 70–99)
Phosphorus: 2.3 mg/dL — ABNORMAL LOW (ref 2.5–4.6)
Potassium: 3.3 mmol/L — ABNORMAL LOW (ref 3.5–5.1)
Sodium: 139 mmol/L (ref 135–145)

## 2019-03-05 LAB — GLUCOSE, CAPILLARY
Glucose-Capillary: 203 mg/dL — ABNORMAL HIGH (ref 70–99)
Glucose-Capillary: 152 mg/dL — ABNORMAL HIGH (ref 70–99)
Glucose-Capillary: 209 mg/dL — ABNORMAL HIGH (ref 70–99)
Glucose-Capillary: 169 mg/dL — ABNORMAL HIGH (ref 70–99)

## 2019-03-05 LAB — MAGNESIUM: Magnesium: 1.5 mg/dL — ABNORMAL LOW (ref 1.7–2.4)

## 2019-03-05 MED ORDER — POTASSIUM CHLORIDE CRYS ER 20 MEQ PO TBCR
40.0000 meq | EXTENDED_RELEASE_TABLET | Freq: Two times a day (BID) | ORAL | Status: DC
  Administered 2019-03-05 – 2019-03-09 (×9): 40 meq via ORAL
  Filled 2019-03-05 (×10): qty 2

## 2019-03-05 MED ORDER — POTASSIUM PHOSPHATE MONOBASIC 500 MG PO TABS
500.0000 mg | ORAL_TABLET | Freq: Three times a day (TID) | ORAL | Status: DC
  Administered 2019-03-05 – 2019-03-09 (×13): 500 mg via ORAL
  Filled 2019-03-05 (×20): qty 1

## 2019-03-05 MED ORDER — MAGNESIUM OXIDE 400 (241.3 MG) MG PO TABS
400.0000 mg | ORAL_TABLET | Freq: Two times a day (BID) | ORAL | Status: DC
  Administered 2019-03-05 – 2019-03-09 (×8): 400 mg via ORAL
  Filled 2019-03-05 (×10): qty 1

## 2019-03-05 NOTE — Progress Notes (Signed)
Pharmacy Antibiotic Note  Robert Huang is a 58 y.o. male admitted on 03/02/2019 with wound infection, r/o osteomyelitis .  Pharmacy has been consulted for Vancomycin dosing. WBC 10.9. Renal function good. Planning for MRI to r/o osteo.   Plan: D3 abx, LN 7/7 -Diabetic foo - osteo L fifth toe Vancomycin 1500 mg IV q12h (daily SCr) (Estimated AUC: 506, SCr 0.89) Ceftriaxone 2gm q24h  Flagyl 500mg  IV q8 per MD   Plan I&D 7/10, SCr 0.89 on admit >> 1.01 today  Obtain Vancomycin levels today/tomorrow  Height: 6' (182.9 cm) Weight: 207 lb 12.8 oz (94.3 kg) IBW/kg (Calculated) : 77.6  Temp (24hrs), Avg:98.4 F (36.9 C), Min:98.3 F (36.8 C), Max:98.5 F (36.9 C)  Recent Labs  Lab 03/02/19 2201 03/02/19 2352 03/03/19 0311 03/04/19 0338 03/05/19 0245  WBC 10.9*  --  8.3 5.3 6.2  CREATININE 0.89  --  0.87 0.90 1.01  LATICACIDVEN 0.9 0.9  --   --   --     Estimated Creatinine Clearance: 93.9 mL/min (by C-G formula based on SCr of 1.01 mg/dL).    No Known Allergies   Antimicrobials this admission:  7/7 vanco >> 7/7 metronidazole >> 7/7 ceftriaxone >>  Dose adjustments this admission:   Microbiology results:  7/6 BCx: ngtd 7/7 Surgical PCR: Irene Pap PharmD Pager [phone] 03/05/2019, 12:27 PM

## 2019-03-05 NOTE — Progress Notes (Addendum)
[name]  PROGRESS NOTE    Robert Huang  BWL:[id number] DOB: 08-16-1961 DOA: 03/02/2019 PCP: [name], MD   Brief Narrative:   Robert Huang a 58 y.o.malewith medical history significant ofhypertension, hyperlipidemia, diabetes mellitus, CAD, stent placement, hypothyroidism, gout, anxiety, who presents with left foot wound with infection.  Patient states that he has a wound in left lateral foot, which has been going on for 6 weeks.Pt was seen by PAphysicianinorthopedic surgeon, Dr. Geannie Risen onWednesday. Patient was started with Keflex.He reports that he has been taking Keflex,but no significant help. He developed fever and chills, with temperature 101.5today. Patient states that he has mildpain in left foot on walking.No chest pain, shortness breath, cough. He has nausea, no vomiting, diarrhea or abdominal pain. No symptoms of UTI or unilateral weakness. Patient states that he was scheduled for MRI of left foot to rule out osteomyelitis by orthopedic surgeon, but has not done yet.   Assessment & Plan:   Principal Problem:   Left foot infection Active Problems:   Hypothyroidism   Dyslipidemia   Gout   Anxiety state   Essential hypertension   Coronary atherosclerosis   Diabetes mellitus with complication (HCC)   Diabetic foot ulcers (HCC)   Hypokalemia   Left footdiabetic foot ulcerswithinfection     - ptdeveloped fever of101.5 and chills; mild leukocytosis with WBC 10.9.     - Pt failedoutpatient antibiotic of Keflex treatment.     - IV abx with vanc/ceftriaxone/flagyl     - MRI with evidence of osteomyelitis throughout 5th metatarsal with skin ulceration and surrounding cellulitis noted as well     - ABI's wnl (preliminary study)     - PRN Zofran for nausea,Norcofor pain     - Bld Cx NTD     - ESR (50) and CRP (2.9)     - wound care consult, appreciate recs     - ortho consulted, appreciate assistance     - ID onboard, appreciate  assistance     - he has failed outpt abx; we don't have an culture data at this point to guide our treatment; discussed with ID/ortho, we need a Bx and Cx of the bone; IR is unable to do this, so he is now scheduled for OR on 03/06/19  Hypothyroidism     - Synthroid  Dyslipidemia:     - Crestor,Zetia, fenofibrate  Gout:     - prn Indocin  Anxiety state     - was on PRN xanax; denies complaint today  Essential hypertension     - Continue home amlodipine, clonidine, Prinzide, metoprolol  Coronary atherosclerosis     - s/p of stent. No CP.     - continueCrestor, Zetia, fenofibrate metoprolol, PRN nitroglycerin  Diabetes mellitus with complication, withdiabetic foot ulcer     - Last A1c8.0 on 10/03/18, poorly controled. Patient is takingglipizide and metforminat home     - SSI  Hypokalemia/Hypomagnesemia/Hypophosphatemia     - replete and monitor  Spoke with ID. In order to increase our chances of good Cx data, will hold abx for now.   DVT prophylaxis: heparin Code Status: FULL   Disposition Plan: TBD   Consultants:   Orthopedics  ID  Antimicrobials:  . Rocephin, flagyl, vanc     Subjective: "I'm willing to do whatever I need to get better."  Objective: Vitals:   03/04/19 1031 03/04/19 1326 03/04/19 2132 03/05/19 0648  BP:  128/78 (!) 146/71 (!) 145/67  Pulse: 64 (!) 53  62 (!) 49  Resp:  _0 Temp:  98.3 F (36.8 C) 98.5 F (36.9 C) 98.3 F (36.8 C)  TempSrc:    Oral  SpO2:  100% 100% 100%  Weight:      Height:        Intake/Output Summary (Last 24 hours) at 03/05/2019 1002 Last data filed at 03/05/2019 0114 Gross per 24 hour  Intake 1278.36 ml  Output 1300 ml  Net -21.64 ml   Filed Weights   03/02/19 2047 03/03/19 0130  Weight: 96.2 kg 94.3 kg    Examination:  General: 58 y.o. male resting in bed in NAD Cardiovascular: RRR, +S1, S2, no m/g/r, equal pulses throughout Respiratory: CTABL, no w/r/r, normal WOB GI: BS+, NDNT, no  masses noted, no organomegaly noted MSK: No e/c/c; LLE bandaging C/D/I Neuro: A&O x 3, no focal deficits  Data Reviewed: I have personally reviewed following labs and imaging studies.  CBC: Recent Labs  Lab 03/02/19 2201 03/03/19 0311 03/04/19 0338 03/05/19 0245  WBC 10.9* 8.3 5.3 6.2  NEUTROABS 9.4*  --   --  4.2  HGB 11.6* 10.6* 10.4* 11.2*  HCT 35.6* 34.1* 34.4* 37.6*  MCV 93.2 95.3 97.7 97.4  PLT 283 273 258 121   Basic Metabolic Panel: Recent Labs  Lab 03/02/19 2201 03/03/19 0311 03/04/19 0338 03/05/19 0245  NA 135 140 142 139  K 3.3* 3.7 3.4* 3.3*  CL 106 108 109 102  CO2 20* _1 GLUCOSE 203* 131* 172* 216*  BUN _2 CREATININE 0.89 0.87 0.90 1.01  CALCIUM 8.7* 8.9 8.6* 8.7*  MG  --  1.4* 1.6* 1.5*  PHOS  --   --   --  2.3*   GFR: Estimated Creatinine Clearance: 93.9 mL/min (by C-G formula based on SCr of 1.01 mg/dL). Liver Function Tests: Recent Labs  Lab 03/02/19 2201 03/04/19 0338 03/05/19 0245  AST 14* 16  --   ALT 15 15  --   ALKPHOS 69 53  --   BILITOT 0.5 0.5  --   PROT 6.9 6.8  --   ALBUMIN 3.3* 3.0* 3.2*   No results for input(s): LIPASE, AMYLASE in the last 168 hours. No results for input(s): AMMONIA in the last 168 hours. Coagulation Profile: Recent Labs  Lab 03/03/19 0311  INR 1.1   Cardiac Enzymes: No results for input(s): CKTOTAL, CKMB, CKMBINDEX, TROPONINI in the last 168 hours. BNP (last 3 results) No results for input(s): PROBNP in the last 8760 hours. HbA1C: No results for input(s): HGBA1C in the last 72 hours. CBG: Recent Labs  Lab 03/04/19 1131 03/04/19 1324 03/04/19 1613 03/04/19 2207 03/05/19 0734  GLUCAP 171* 154* 177* 149* 203*   Lipid Profile: No results for input(s): CHOL, HDL, LDLCALC, TRIG, CHOLHDL, LDLDIRECT in the last 72 hours. Thyroid Function Tests: No results for input(s): TSH, T4TOTAL, FREET4, T3FREE, THYROIDAB in the last 72 hours. Anemia Panel: No results for input(s): VITAMINB12,  FOLATE, FERRITIN, TIBC, IRON, RETICCTPCT in the last 72 hours. Sepsis Labs: Recent Labs  Lab 03/02/19 2201 03/02/19 2352  LATICACIDVEN 0.9 0.9    Recent Results (from the past 240 hour(s))  Culture, blood (routine x 2)     Status: None (Preliminary result)   Collection Time: 03/02/19 10:01 PM   Specimen: BLOOD LEFT FOREARM  Result Value Ref Range Status   Specimen Description   Final    BLOOD LEFT FOREARM Performed at [facility], [address] [place], [place] [address]    Special Requests   Final    BOTTLES DRAWN AEROBIC AND ANAEROBIC Blood Culture adequate volume Performed at [facility], [address] [place], [place] [address]    Culture   Final    NO GROWTH 1 DAY Performed at [facility], [place] [address] [place], [place] [address]    Report Status PENDING  Incomplete  SARS Coronavirus 2 (Hosp order,Performed in [facility] lab via [employer] ID)     Status: None   Collection Time: 03/02/19 10:01 PM   Specimen: Dry Nasal Swab ([employer] ID Now)  Result Value Ref Range Status   SARS Coronavirus 2 ([employer] ID Now) NEGATIVE NEGATIVE Final    Comment: (NOTE) SARS-CoV-2 target nucleic acids are NOT DETECTED. The SARS-CoV-2 RNA is generally detectable in upper and lower respiratory specimens during the acute phase of infection.  Negativeresults do not preclude SARS-CoV-2 infection, do not rule out coinfections with other pathogens, and should not be used as the  sole basis for treatment or other patient management decisions.  Negative results must be combined with clinical observations, patient history, and epidemiological information. The expected result is Negative. Fact Sheet for Patients: GolfingFamily.no Fact Sheet for Healthcare Providers: https://www.hernandez-brewer.com/ This test is not yet approved or cleared by the [place] FDA and  has been authorized for detection and/or diagnosis of SARS-CoV-2  by FDA under an Emergency Use Authorization (EUA).  This EUA will remain in effect (meaning this test can be used) for the duration of  the COVID19 declaration under Section 5 64(b)(1) of the Act, 21 U.S.C.  section [phone] 3(b)(1), unless the authorization is terminated or revoked sooner. Performed at [facility], [address] [place], [place] [address]   Culture, blood (routine x 2)     Status: None (Preliminary result)   Collection Time: 03/02/19 10:04 PM   Specimen: BLOOD LEFT HAND  Result Value Ref Range Status   Specimen Description   Final    BLOOD LEFT HAND Performed at [facility], [address] [place], [place] [address]    Special Requests   Final    BOTTLES DRAWN AEROBIC AND ANAEROBIC Blood Culture results may not be optimal due to an inadequate volume of blood received in culture bottles Performed at [facility], [address] [place], [place] [address]    Culture   Final    NO GROWTH 1 DAY Performed at [facility], [place] [address], [place], [place] [address]    Report Status PENDING  Incomplete  Surgical PCR screen     Status: Abnormal   Collection Time: 03/03/19  2:02 AM   Specimen: Nasal Mucosa; Nasal Swab  Result Value Ref Range Status   MRSA, PCR POSITIVE (A) NEGATIVE Final    Comment: CRITICAL RESULT CALLED TO, READ BACK BY AND VERIFIED WITH: RN KNAPP AT 0413 03/03/19 CRUICKSHANK A    Staphylococcus aureus POSITIVE (A) NEGATIVE Final    Comment: (NOTE) The Xpert SA Assay (FDA approved for NASAL specimens in patients 48 years of age and older), is one component of a comprehensive surveillance program. It is not intended to diagnose infection nor to guide or monitor treatment. Performed at [facility], [place] [address], [place], [place] [address]          Radiology Studies: Mr Foot Left W Wo Contrast  Result Date: 03/03/2019 CLINICAL DATA:  Diabetic patient with a skin wound adjacent  to the fifth metatarsal. EXAM: MRI OF THE LEFT FOREFOOT WITHOUT AND WITH CONTRAST TECHNIQUE: Multiplanar, multisequence MR imaging of the left forefoot was performed both before and after administration of intravenous contrast. CONTRAST:  10 cc Gadavist IV. COMPARISON:  Plain films left foot 03/02/2019 FINDINGS: Bones/Joint/Cartilage Healed fracture of the distal diaphysis of the fifth metatarsal is seen as on the prior plain films. There is marrow edema and enhancement throughout the fifth metatarsal consistent with osteomyelitis. There is no other marrow abnormality to suggest osteomyelitis. Loss of fat saturation in all the toes on T2 weighted imaging is incidentally noted. Ligaments Intact. Muscles and Tendons Atrophy of intrinsic musculature the foot is identified. No intramuscular fluid collection is seen. Soft tissues Skin wound along the lateral margin of the distal diaphysis of the fifth metatarsal appears to extend to bone. No underlying abscess. Soft tissue edema and enhancement about the fifth metatarsal are identified. IMPRESSION: Edema and enhancement throughout the fifth metatarsal consistent with osteomyelitis. Skin ulceration and surrounding cellulitis along the distal diaphysis of the fifth metatarsal noted. Healed fracture distal fifth metatarsal as seen on prior plain films. Electronically Signed   By: Inge Rise M.D.   On: 03/03/2019 12:01        Scheduled Meds: . amLODipine  10 mg Oral Daily  . Chlorhexidine Gluconate Cloth  6 each Topical Daily  . cloNIDine  0.3 mg Oral BID  . collagenase  1 application Topical Daily  . ezetimibe  10 mg Oral Daily  . fenofibrate  160 mg Oral Daily  . heparin  5,000 Units Subcutaneous Q8H  . lisinopril  20 mg Oral BID   And  . hydrochlorothiazide  12.5 mg Oral BID  . insulin aspart  0-5 Units Subcutaneous QHS  . insulin aspart  0-9 Units Subcutaneous TID WC  . levothyroxine  112 mcg Oral Daily  . magnesium oxide  400 mg Oral BID  .  metoprolol succinate  100 mg Oral BID  . mupirocin ointment  1 application Nasal BID  . potassium chloride  40 mEq Oral BID  . potassium phosphate (monobasic)  500 mg Oral TID WC & HS  . rosuvastatin  40 mg Oral Daily   Continuous Infusions: . cefTRIAXone (ROCEPHIN)  IV Stopped (03/04/19 1742)  . metronidazole 500 mg (03/05/19 0815)  . vancomycin 1,500 mg (03/05/19 0257)     LOS: 2 days    Time spent: 35 minutes spent in the coordination of care today.    Jonnie Finner, DO [facility] Pager [phone]  If 7PM-7AM, please contact night-coverage [email or web address] Password [email or web address] 03/05/2019, 10:02 AM

## 2019-03-06 ENCOUNTER — Encounter (HOSPITAL_COMMUNITY)
Admission: EM | Disposition: A | Discharge status: Home Health | Payer: Self-pay | Source: Home / Self Care | Attending: Internal Medicine

## 2019-03-06 ENCOUNTER — Inpatient Hospital Stay (HOSPITAL_COMMUNITY): Payer: 59 | Admitting: Anesthesiology

## 2019-03-06 ENCOUNTER — Encounter (HOSPITAL_COMMUNITY): Payer: Self-pay

## 2019-03-06 HISTORY — PX: AMPUTATION: SHX166

## 2019-03-06 LAB — GLUCOSE, CAPILLARY
Glucose-Capillary: 103 mg/dL — ABNORMAL HIGH (ref 70–99)
Glucose-Capillary: 138 mg/dL — ABNORMAL HIGH (ref 70–99)
Glucose-Capillary: 148 mg/dL — ABNORMAL HIGH (ref 70–99)
Glucose-Capillary: 140 mg/dL — ABNORMAL HIGH (ref 70–99)
Glucose-Capillary: 175 mg/dL — ABNORMAL HIGH (ref 70–99)

## 2019-03-06 LAB — CBC WITH DIFFERENTIAL/PLATELET
Abs Immature Granulocytes: 0.07 10*3/uL (ref 0.00–0.07)
Basophils Absolute: 0 10*3/uL (ref 0.0–0.1)
Basophils Relative: 1 %
Eosinophils Absolute: 0.2 10*3/uL (ref 0.0–0.5)
Eosinophils Relative: 2 %
HCT: 34.5 % — ABNORMAL LOW (ref 39.0–52.0)
Hemoglobin: 10.7 g/dL — ABNORMAL LOW (ref 13.0–17.0)
Immature Granulocytes: 1 %
Lymphocytes Relative: 13 %
Lymphs Abs: 1 10*3/uL (ref 0.7–4.0)
MCH: 30.4 pg (ref 26.0–34.0)
MCHC: 31 g/dL (ref 30.0–36.0)
MCV: 98 fL (ref 80.0–100.0)
Monocytes Absolute: 0.9 10*3/uL (ref 0.1–1.0)
Monocytes Relative: 12 %
Neutro Abs: 5.2 10*3/uL (ref 1.7–7.7)
Neutrophils Relative %: 71 %
Platelets: 286 10*3/uL (ref 150–400)
RBC: 3.52 MIL/uL — ABNORMAL LOW (ref 4.22–5.81)
RDW: 12.7 % (ref 11.5–15.5)
WBC: 7.4 10*3/uL (ref 4.0–10.5)
nRBC: 0 % (ref 0.0–0.2)

## 2019-03-06 LAB — RENAL FUNCTION PANEL
Albumin: 3 g/dL — ABNORMAL LOW (ref 3.5–5.0)
Anion gap: 9 (ref 5–15)
BUN: 11 mg/dL (ref 6–20)
CO2: 26 mmol/L (ref 22–32)
Calcium: 8.6 mg/dL — ABNORMAL LOW (ref 8.9–10.3)
Chloride: 103 mmol/L (ref 98–111)
Creatinine, Ser: 0.82 mg/dL (ref 0.61–1.24)
GFR calc Af Amer: >60 mL/min (ref >60)
GFR calc non Af Amer: >60 mL/min (ref >60)
Glucose, Bld: 169 mg/dL — ABNORMAL HIGH (ref 70–99)
Phosphorus: 2.8 mg/dL (ref 2.5–4.6)
Potassium: 3.5 mmol/L (ref 3.5–5.1)
Sodium: 138 mmol/L (ref 135–145)

## 2019-03-06 LAB — MAGNESIUM: Magnesium: 1.6 mg/dL — ABNORMAL LOW (ref 1.7–2.4)

## 2019-03-06 SURGERY — AMPUTATION, FOOT, RAY
Anesthesia: Monitor Anesthesia Care | Laterality: Left

## 2019-03-06 MED ORDER — SODIUM CHLORIDE 0.9 % IR SOLN
Status: DC | PRN
  Administered 2019-03-06: 1000 mL

## 2019-03-06 MED ORDER — EPHEDRINE 5 MG/ML INJ
INTRAVENOUS | Status: AC
  Filled 2019-03-06: qty 10

## 2019-03-06 MED ORDER — LIDOCAINE 2% (20 MG/ML) 5 ML SYRINGE
INTRAMUSCULAR | Status: AC
  Filled 2019-03-06: qty 5

## 2019-03-06 MED ORDER — KETOROLAC TROMETHAMINE 30 MG/ML IJ SOLN
30.0000 mg | Freq: Once | INTRAMUSCULAR | Status: AC | PRN
  Administered 2019-03-06: 16:00:00 30 mg via INTRAVENOUS

## 2019-03-06 MED ORDER — ONDANSETRON HCL 4 MG/2ML IJ SOLN: 4.0000 mg | Freq: Four times a day (QID) | INTRAMUSCULAR | Status: DC | PRN

## 2019-03-06 MED ORDER — ONDANSETRON HCL 4 MG/2ML IJ SOLN
INTRAMUSCULAR | Status: AC
  Filled 2019-03-06: qty 2

## 2019-03-06 MED ORDER — METOCLOPRAMIDE HCL 5 MG/ML IJ SOLN: 5.0000 mg | Freq: Three times a day (TID) | INTRAMUSCULAR | Status: DC | PRN

## 2019-03-06 MED ORDER — EPHEDRINE SULFATE-NACL 50-0.9 MG/10ML-% IV SOSY
PREFILLED_SYRINGE | INTRAVENOUS | Status: DC | PRN
  Administered 2019-03-06 (×2): 10 mg via INTRAVENOUS

## 2019-03-06 MED ORDER — MIDAZOLAM HCL 2 MG/2ML IJ SOLN
1.0000 mg | Freq: Once | INTRAMUSCULAR | Status: AC
  Administered 2019-03-06: 2 mg via INTRAVENOUS

## 2019-03-06 MED ORDER — MEPERIDINE HCL 50 MG/ML IJ SOLN: 6.2500 mg | INTRAMUSCULAR | Status: DC | PRN

## 2019-03-06 MED ORDER — LACTATED RINGERS IV SOLN
INTRAVENOUS | Status: DC
  Administered 2019-03-06: 14:00:00 via INTRAVENOUS

## 2019-03-06 MED ORDER — FENTANYL CITRATE (PF) 100 MCG/2ML IJ SOLN
50.0000 ug | Freq: Once | INTRAMUSCULAR | Status: AC
  Administered 2019-03-06: 14:00:00 100 ug via INTRAVENOUS
  Filled 2019-03-06: qty 2

## 2019-03-06 MED ORDER — MIDAZOLAM HCL 2 MG/2ML IJ SOLN
INTRAMUSCULAR | Status: AC
  Administered 2019-03-06: 2 mg via INTRAVENOUS
  Filled 2019-03-06: qty 2

## 2019-03-06 MED ORDER — HYDROMORPHONE HCL 1 MG/ML IJ SOLN
0.2500 mg | INTRAMUSCULAR | Status: DC | PRN
  Administered 2019-03-06 (×2): 0.5 mg via INTRAVENOUS

## 2019-03-06 MED ORDER — HYDROMORPHONE HCL 1 MG/ML IJ SOLN
INTRAMUSCULAR | Status: AC
  Filled 2019-03-06: qty 1

## 2019-03-06 MED ORDER — ATROPINE SULFATE 0.4 MG/ML IV SOSY
PREFILLED_SYRINGE | INTRAVENOUS | Status: DC | PRN
  Administered 2019-03-06: .4 mg via INTRAVENOUS

## 2019-03-06 MED ORDER — PROMETHAZINE HCL 25 MG/ML IJ SOLN: 6.2500 mg | INTRAMUSCULAR | Status: DC | PRN

## 2019-03-06 MED ORDER — PROPOFOL 10 MG/ML IV BOLUS
INTRAVENOUS | Status: AC
  Filled 2019-03-06: qty 20

## 2019-03-06 MED ORDER — METOPROLOL SUCCINATE ER 50 MG PO TB24
100.0000 mg | ORAL_TABLET | Freq: Every day | ORAL | Status: DC
  Administered 2019-03-08 – 2019-03-09 (×2): 100 mg via ORAL
  Filled 2019-03-06: qty 2
  Filled 2019-03-06: qty 1
  Filled 2019-03-06: qty 2

## 2019-03-06 MED ORDER — METOCLOPRAMIDE HCL 5 MG PO TABS
5.0000 mg | ORAL_TABLET | Freq: Three times a day (TID) | ORAL | Status: DC | PRN
  Filled 2019-03-06: qty 2

## 2019-03-06 MED ORDER — CHLORHEXIDINE GLUCONATE 4 % EX LIQD: 60.0000 mL | Freq: Once | CUTANEOUS | Status: DC

## 2019-03-06 MED ORDER — LIDOCAINE 2% (20 MG/ML) 5 ML SYRINGE
INTRAMUSCULAR | Status: DC | PRN
  Administered 2019-03-06: 100 mg via INTRAVENOUS

## 2019-03-06 MED ORDER — ONDANSETRON HCL 4 MG PO TABS
4.0000 mg | ORAL_TABLET | Freq: Four times a day (QID) | ORAL | Status: DC | PRN
  Filled 2019-03-06: qty 1

## 2019-03-06 MED ORDER — FENTANYL CITRATE (PF) 100 MCG/2ML IJ SOLN
INTRAMUSCULAR | Status: AC
  Filled 2019-03-06: qty 2

## 2019-03-06 MED ORDER — FENTANYL CITRATE (PF) 100 MCG/2ML IJ SOLN
50.0000 ug | Freq: Once | INTRAMUSCULAR | Status: AC
  Administered 2019-03-06: 100 ug via INTRAVENOUS

## 2019-03-06 MED ORDER — ROPIVACAINE HCL 7.5 MG/ML IJ SOLN
INTRAMUSCULAR | Status: DC | PRN
  Administered 2019-03-06 (×4): 5 mL via PERINEURAL

## 2019-03-06 MED ORDER — PROPOFOL 10 MG/ML IV BOLUS
INTRAVENOUS | Status: DC | PRN
  Administered 2019-03-06: 150 mg via INTRAVENOUS

## 2019-03-06 MED ORDER — ENSURE PRE-SURGERY PO LIQD
296.0000 mL | Freq: Once | ORAL | Status: AC
  Administered 2019-03-06: 09:00:00 296 mL via ORAL
  Filled 2019-03-06: qty 296

## 2019-03-06 MED ORDER — ONDANSETRON HCL 4 MG/2ML IJ SOLN
INTRAMUSCULAR | Status: DC | PRN
  Administered 2019-03-06: 4 mg via INTRAVENOUS

## 2019-03-06 MED ORDER — PROPOFOL 10 MG/ML IV BOLUS
INTRAVENOUS | Status: AC
  Filled 2019-03-06: qty 40

## 2019-03-06 MED ORDER — CEFAZOLIN SODIUM-DEXTROSE 2-4 GM/100ML-% IV SOLN
2.0000 g | INTRAVENOUS | Status: AC
  Administered 2019-03-06: 2 g via INTRAVENOUS

## 2019-03-06 MED ORDER — POVIDONE-IODINE 10 % EX SWAB
2.0000 "application " | Freq: Once | CUTANEOUS | Status: AC
  Administered 2019-03-06: 2 via TOPICAL

## 2019-03-06 MED ORDER — MIDAZOLAM HCL 2 MG/2ML IJ SOLN
1.0000 mg | Freq: Once | INTRAMUSCULAR | Status: AC
  Administered 2019-03-06: 2 mg via INTRAVENOUS
  Filled 2019-03-06: qty 2

## 2019-03-06 MED ORDER — DOCUSATE SODIUM 100 MG PO CAPS
100.0000 mg | ORAL_CAPSULE | Freq: Two times a day (BID) | ORAL | Status: DC
  Administered 2019-03-06 – 2019-03-09 (×6): 100 mg via ORAL
  Filled 2019-03-06 (×7): qty 1

## 2019-03-06 MED ORDER — FENTANYL CITRATE (PF) 100 MCG/2ML IJ SOLN
INTRAMUSCULAR | Status: AC
  Administered 2019-03-06: 14:00:00 100 ug via INTRAVENOUS
  Filled 2019-03-06: qty 2

## 2019-03-06 MED ORDER — KETOROLAC TROMETHAMINE 30 MG/ML IJ SOLN
INTRAMUSCULAR | Status: AC
  Filled 2019-03-06: qty 1

## 2019-03-06 SURGICAL SUPPLY — 55 items
BAG ZIPLOCK 12X15 (MISCELLANEOUS) ×6 IMPLANT
BLADE OSCILLATING/SAGITTAL (BLADE) ×2
BLADE SW THK.38XMED LNG THN (BLADE) ×1 IMPLANT
BNDG COHESIVE 3X5 TAN STRL LF (GAUZE/BANDAGES/DRESSINGS) ×2 IMPLANT
BNDG COHESIVE 4X5 TAN STRL (GAUZE/BANDAGES/DRESSINGS) ×3 IMPLANT
BNDG CONFORM 3 STRL LF (GAUZE/BANDAGES/DRESSINGS) ×3 IMPLANT
BNDG GAUZE ELAST 4 BULKY (GAUZE/BANDAGES/DRESSINGS) ×3 IMPLANT
CHLORAPREP W/TINT 26 (MISCELLANEOUS) ×2 IMPLANT
CONT SPEC 4OZ CLIKSEAL STRL BL (MISCELLANEOUS) ×2 IMPLANT
COVER SURGICAL LIGHT HANDLE (MISCELLANEOUS) ×6 IMPLANT
COVER WAND RF STERILE (DRAPES) IMPLANT
CUFF TOURN SGL QUICK 34 (TOURNIQUET CUFF) ×2
CUFF TRNQT CYL 34X4.125X (TOURNIQUET CUFF) ×1 IMPLANT
DRAPE U-SHAPE 47X51 STRL (DRAPES) ×1 IMPLANT
DRSG EMULSION OIL 3X16 NADH (GAUZE/BANDAGES/DRESSINGS) ×3 IMPLANT
DRSG EMULSION OIL 3X3 NADH (GAUZE/BANDAGES/DRESSINGS) ×3 IMPLANT
DRSG PAD ABDOMINAL 8X10 ST (GAUZE/BANDAGES/DRESSINGS) ×5 IMPLANT
DURAPREP 26ML APPLICATOR (WOUND CARE) ×2 IMPLANT
ELECT REM PT RETURN 15FT ADLT (MISCELLANEOUS) ×2 IMPLANT
GAUZE 4X4 16PLY RFD (DISPOSABLE) ×3 IMPLANT
GAUZE SPONGE 2X2 8PLY STRL LF (GAUZE/BANDAGES/DRESSINGS) ×1 IMPLANT
GAUZE SPONGE 4X4 12PLY STRL (GAUZE/BANDAGES/DRESSINGS) ×3 IMPLANT
GLOVE BIOGEL M 7.0 STRL (GLOVE) IMPLANT
GLOVE BIOGEL PI IND STRL 7.5 (GLOVE) ×1 IMPLANT
GLOVE BIOGEL PI IND STRL 8.5 (GLOVE) ×1 IMPLANT
GLOVE BIOGEL PI INDICATOR 7.5 (GLOVE) ×2
GLOVE BIOGEL PI INDICATOR 8.5 (GLOVE) ×2
GLOVE ECLIPSE 8.0 STRL XLNG CF (GLOVE) IMPLANT
GLOVE INDICATOR 8.0 STRL GRN (GLOVE) ×3 IMPLANT
GLOVE ORTHO TXT STRL SZ7.5 (GLOVE) ×8 IMPLANT
GLOVE SURG ORTHO 8.0 STRL STRW (GLOVE) ×5 IMPLANT
GOWN STRL REUS W/TWL LRG LVL3 (GOWN DISPOSABLE) ×6 IMPLANT
GOWN STRL REUS W/TWL XL LVL3 (GOWN DISPOSABLE) ×4 IMPLANT
KIT BASIN OR (CUSTOM PROCEDURE TRAY) ×6 IMPLANT
KIT TURNOVER KIT A (KITS) IMPLANT
MANIFOLD NEPTUNE II (INSTRUMENTS) ×3 IMPLANT
NS IRRIG 1000ML POUR BTL (IV SOLUTION) ×4 IMPLANT
PACK ORTHO EXTREMITY (CUSTOM PROCEDURE TRAY) ×6 IMPLANT
PAD CAST 3X4 CTTN HI CHSV (CAST SUPPLIES) IMPLANT
PAD CAST 4YDX4 CTTN HI CHSV (CAST SUPPLIES) ×1 IMPLANT
PADDING CAST COTTON 3X4 STRL (CAST SUPPLIES) ×2
PADDING CAST COTTON 4X4 STRL (CAST SUPPLIES) ×2
PROTECTOR NERVE ULNAR (MISCELLANEOUS) ×9 IMPLANT
SPONGE GAUZE 2X2 STER 10/PKG (GAUZE/BANDAGES/DRESSINGS) ×2
SUCTION FRAZIER HANDLE 12FR (TUBING) ×2
SUCTION TUBE FRAZIER 12FR DISP (TUBING) ×1 IMPLANT
SUT ETHILON 3 0 PS 1 (SUTURE) ×1 IMPLANT
SUT ETHILON 4 0 PS 2 18 (SUTURE) ×5 IMPLANT
SUT PDS AB 0 CT1 36 (SUTURE) ×2 IMPLANT
SUT VICRYL 0 27 CT2 27 ABS (SUTURE) ×2 IMPLANT
SWAB COLLECTION DEVICE MRSA (MISCELLANEOUS) IMPLANT
SWAB CULTURE ESWAB REG 1ML (MISCELLANEOUS) IMPLANT
TOWEL OR 17X26 10 PK STRL BLUE (TOWEL DISPOSABLE) ×12 IMPLANT
WATER STERILE IRR 1000ML POUR (IV SOLUTION) ×2 IMPLANT
YANKAUER SUCT BULB TIP 10FT TU (MISCELLANEOUS) ×2 IMPLANT

## 2019-03-06 NOTE — Anesthesia Preprocedure Evaluation (Signed)
Anesthesia Evaluation  Patient identified by MRN, date of birth, ID band Patient awake    Airway Mallampati: I       Dental no notable dental hx. (+) Teeth Intact   Pulmonary neg pulmonary ROS,    Pulmonary exam normal breath sounds clear to auscultation       Cardiovascular hypertension, Pt. on medications and Pt. on home beta blockers Normal cardiovascular exam Rhythm:Regular Rate:Normal     Neuro/Psych PSYCHIATRIC DISORDERS Anxiety negative neurological ROS     GI/Hepatic negative GI ROS, Neg liver ROS,   Endo/Other  diabetes, Oral Hypoglycemic Agents, Insulin DependentHypothyroidism   Renal/GU      Musculoskeletal   Abdominal Normal abdominal exam  (+)   Peds  Hematology  (+) anemia ,   Anesthesia Other Findings   Reproductive/Obstetrics                             Anesthesia Physical Anesthesia Plan  ASA: II  Anesthesia Plan: MAC and Regional   Post-op Pain Management:  Regional for Post-op pain   Induction:   PONV Risk Score and Plan: 1 and Ondansetron  Airway Management Planned: Nasal Cannula and Natural Airway  Additional Equipment:   Intra-op Plan:   Post-operative Plan:   Informed Consent: I have reviewed the patients History and Physical, chart, labs and discussed the procedure including the risks, benefits and alternatives for the proposed anesthesia with the patient or authorized representative who has indicated his/her understanding and acceptance.     Dental advisory given  Plan Discussed with: CRNA  Anesthesia Plan Comments:         Anesthesia Quick Evaluation

## 2019-03-06 NOTE — Transfer of Care (Signed)
Immediate Anesthesia Transfer of Care Note  Patient: Robert Huang  Procedure(s) Performed: Ray Amputation left foot (Left )  Patient Location: PACU  Anesthesia Type:GA combined with regional for post-op pain  Level of Consciousness: awake, alert  and oriented  Airway & Oxygen Therapy: Patient Spontanous Breathing and Patient connected to face mask oxygen  Post-op Assessment: Report given to RN and Post -op Vital signs reviewed and stable  Post vital signs: Reviewed and stable  Last Vitals:  Vitals Value Taken Time  BP 139/77 03/06/19 1557  Temp    Pulse 53 03/06/19 1558  Resp 12 03/06/19 1558  SpO2 100 % 03/06/19 1558  Vitals shown include unvalidated device data.  Last Pain:  Vitals:   03/06/19 1350  TempSrc:   PainSc: 4       Patients Stated Pain Goal: 3 (71/24/58 0998)  Complications: No apparent anesthesia complications

## 2019-03-06 NOTE — Discharge Instructions (Signed)
-   maintain post op bandage until follow up appointment with Dr. Stann Mainland in 3 weeks - ok to weight bear as tolerated in post op boot to the left leg - do not get bandages or left foot wet until you have followed up with Dr. Stann Mainland for a wound check  - elevate the left lower extremity with "toes above nose" throughout the day and when lying down  - follow up in 3 weeks with Dr. Stann Mainland  - take an 81 mg asa once daily for 6 weeks for prevention of blood clots

## 2019-03-06 NOTE — Anesthesia Postprocedure Evaluation (Signed)
Anesthesia Post Note  Patient: Robert Huang [id number]  Procedure(s) Performed: Jeanell Sparrow Amputation left foot (Left )     Patient location during evaluation: PACU Anesthesia Type: General Level of consciousness: sedated Pain management: pain level controlled Vital Signs Assessment: post-procedure vital signs reviewed and stable Respiratory status: spontaneous breathing Cardiovascular status: stable Postop Assessment: no apparent nausea or vomiting Anesthetic complications: no    Last Vitals:  Vitals:   03/06/19 1615 03/06/19 1651  BP: (!) 147/74 (!) 141/72  Pulse: (!) 54 (!) 55  Resp: (!) 23   Temp: (!) 36.3 C 36.6 C  SpO2: 100% 98%    Last Pain:  Vitals:   03/06/19 1651  TempSrc: Oral  PainSc:    Pain Goal: Patients Stated Pain Goal: 3 (03/06/19 1350)  LLE Motor Response: Purposeful movement (03/06/19 1615) LLE Sensation: Numbness (03/06/19 1615) RLE Motor Response: Purposeful movement (03/06/19 1615)          [name]

## 2019-03-06 NOTE — H&P (Signed)
H&P update  The surgical history has been reviewed and remains accurate without interval change.  The patient was re-examined and patient's physiologic condition has not changed significantly in the last 30 days. The condition still exists that makes this procedure necessary. The treatment plan remains the same, without new options for care.  No new pharmacological allergies or types of therapy has been initiated that would change the plan or the appropriateness of the plan.  The patient and/or family understand the potential benefits and risks.  We have discussed last night and again this am the indications for left foot 5th ray amputation.  He is in agreement and all questions have been answered to his satisfaction.  Ronal Maybury P. Stann Mainland, MD 03/06/2019 2:49 PM

## 2019-03-06 NOTE — Op Note (Addendum)
Date of Surgery: 03/06/2019  INDICATIONS: Robert Huang is a 58 y.o.-year-old male with a left foot diabetic ulcer along the fifth metatarsal neck as well as MRI confirmed fifth metatarsal osteomyelitis.  He had failed a short course of oral antibiotics and presented back to the emergency department.  I was consulted for operative removal of the infected bone.  We have recommended fifth ray amputation of the left foot.;  The patient did consent to the procedure after discussion of the risks and benefits.  PREOPERATIVE DIAGNOSIS:  1.  Diabetic foot ulcer left foot 2.  Osteomyelitis left fifth metatarsal  POSTOPERATIVE DIAGNOSIS: Same.  PROCEDURE:  1.  Ray amputation of fifth ray left foot 2.  Complex closure with tissue advancement around diabetic foot ulcer  SURGEON: Robert Huang, M.D.  ASSIST:  None.  ANESTHESIA:  general  IV FLUIDS AND URINE: See anesthesia.  ESTIMATED BLOOD LOSS: 10 mL.  IMPLANTS: None  DRAINS: None  Tourniquet 27 minutes at 578 mmHg  COMPLICATIONS: None.  DESCRIPTION OF PROCEDURE: The patient was brought to the operating room and placed supine on the operating table.  The patient had been signed prior to the procedure and this was documented. The patient had the anesthesia placed by the anesthesiologist.  A time-out was performed to confirm that this was the correct patient, site, side and location. The patient did receive antibiotics prior to the incision and was re-dosed during the procedure as needed at indicated intervals.  A tourniquet was placed.  The patient had the operative extremity prepped and draped in the standard surgical fashion.     We began the procedure by performing a direct lateral incision with a racquet extension around the base of the fifth toe.  We did incorporate the 15 mm x 15 mm full-thickness ulcer into this incision.  This was ellipsed out of the wound and passed off the field to be discarded.  We then carried dissection down  directly to bone.  We used a periosteal elevator to move medially and plantarly around the fifth metatarsal and the base of the small toe.  We then used a saw to create an osteotomy at the base of the fifth metatarsal but we did leave the attachment of the peroneus and plantar fascial attachments.  We then freed the metatarsal and small toe of any soft tissue attachments and passed this off the field to be sent to pathology.  We then copiously irrigated the wound and then performed a sharp excisional debridement of the ulcer.  This was accomplished using a knife and all nonviable tissue was sharply debrided and removed.   We then moved to perform a complex closure which required a advancement in a Z fashion of the plantar tissue.  This was due to the defect left from the ulcer excision.  Immobilized plantar tissue and full-thickness flaps and advanced distally.  We then closed primarily with 0 PDS and 4-0 nylon.\  A standard bulky sterile dressing was applied.  All counts were correct x2.  No noted intraoperative complications.  He was transported to PACU in stable condition.    POSTOPERATIVE PLAN:  He will be placed in a postop shoe on the floor and allowed to heel weight-bear through the operative extremity.  He will maintain postop bandages until follow-up.  He will return to the medicine service for final disposition and discharge home.  I will see him back in the office in 3 weeks for routine postoperative wound check.  I would recommend  oral aspirin 81 mg once per day x6 weeks for DVT prophylaxis.

## 2019-03-06 NOTE — Progress Notes (Signed)
Assisted Dr. Hatchett with left, ultrasound guided, popliteal block. Side rails up, monitors on throughout procedure. See vital signs in flow sheet. Tolerated Procedure well. 

## 2019-03-06 NOTE — Brief Op Note (Signed)
03/06/2019  3:59 PM  PATIENT:  Robert Huang  58 y.o. male  PRE-OPERATIVE DIAGNOSIS:  left foot ulcer  POST-OPERATIVE DIAGNOSIS:  left foot ulcer  PROCEDURE:  Procedure(s): Ray Amputation left foot (Left)  SURGEON:  Surgeon(s) and Role:    * Nicholes Stairs, MD - Primary  PHYSICIAN ASSISTANT:   ASSISTANTS: none   ANESTHESIA:   regional and general  EBL:  10 mL   BLOOD ADMINISTERED: none   DRAINS: none   LOCAL MEDICATIONS USED:  NONE  SPECIMEN:  Source of Specimen:  metatarsal bone to pathology  DISPOSITION OF SPECIMEN:  PATHOLOGY  COUNTS:  YES  TOURNIQUET:   Total Tourniquet Time Documented: Thigh (Left) - 26 minutes Total: Thigh (Left) - 26 minutes   DICTATION: .Note written in EPIC  PLAN OF CARE: Admit to inpatient   PATIENT DISPOSITION:  PACU - hemodynamically stable.   Delay start of Pharmacological VTE agent (>24hrs) due to surgical blood loss or risk of bleeding: not applicable

## 2019-03-06 NOTE — Anesthesia Procedure Notes (Signed)
Anesthesia Regional Block: Popliteal block   Pre-Anesthetic Checklist: ,, timeout performed, Correct Patient, Correct Site, Correct Laterality, Correct Procedure, Correct Position, site marked, Risks and benefits discussed,  Surgical consent,  Pre-op evaluation,  At surgeon's request and post-op pain management  Laterality: Lower and Left  Prep: chloraprep       Needles:  Injection technique: Single-shot  Needle Type: Echogenic Stimulator Needle     Needle Length: 10cm  Needle Gauge: 21   Needle insertion depth: 1.5 cm   Additional Needles:   Procedures:,,,, ultrasound used (permanent image in chart),,,,  Narrative:  Start time: 03/06/2019 2:30 PM End time: 03/06/2019 2:39 PM Injection made incrementally with aspirations every 5 mL.  Performed by: Personally  Anesthesiologist: Lyn Hollingshead, MD

## 2019-03-06 NOTE — Anesthesia Procedure Notes (Signed)
Procedure Name: LMA Insertion Date/Time: 03/06/2019 3:06 PM Performed by: Sharlette Dense, CRNA Patient Re-evaluated:Patient Re-evaluated prior to induction Oxygen Delivery Method: Circle system utilized Preoxygenation: Pre-oxygenation with 100% oxygen LMA: LMA inserted LMA Size: 4.0 Number of attempts: 1 Placement Confirmation: positive ETCO2 and breath sounds checked- equal and bilateral Tube secured with: Tape Dental Injury: Teeth and Oropharynx as per pre-operative assessment

## 2019-03-06 NOTE — Progress Notes (Signed)
[name]  PROGRESS NOTE    Robert Huang  HKV:[id number] DOB: March 31, 1961 DOA: 03/02/2019 PCP: [name], MD   Brief Narrative:   Robert Huang a 58 y.o.malewith medical history significant ofhypertension, hyperlipidemia, diabetes mellitus, CAD, stent placement, hypothyroidism, gout, anxiety, who presents with left foot wound with infection.  Patient states that he has a wound in left lateral foot, which has been going on for 6 weeks.Pt was seen by PAphysicianinorthopedic surgeon, Dr. Geannie Risen onWednesday. Patient was started with Keflex.He reports that he has been taking Keflex,but no significant help. He developed fever and chills, with temperature 101.5today. Patient states that he has mildpain in left foot on walking.No chest pain, shortness breath, cough. He has nausea, no vomiting, diarrhea or abdominal pain. No symptoms of UTI or unilateral weakness. Patient states that he was scheduled for MRI of left foot to rule out osteomyelitis by orthopedic surgeon, but has not done yet.   Assessment & Plan:   Principal Problem:   Left foot infection Active Problems:   Hypothyroidism   Dyslipidemia   Gout   Anxiety state   Essential hypertension   Coronary atherosclerosis   Diabetes mellitus with complication (HCC)   Diabetic foot ulcers (HCC)   Hypokalemia   Left footdiabetic foot ulcerswithinfection - ptdeveloped fever of101.5 and chills; mild leukocytosis with WBC 10.9. - Pt failedoutpatient antibiotic of Keflex treatment. - IV abx with vanc/ceftriaxone/flagyl; abx held for surgery/Cx data - MRI with evidence of osteomyelitis throughout 5th metatarsal with skin ulceration and surrounding cellulitis noted as well - ABI's wnl (preliminary study) - PRN Zofran for nausea,Norcofor pain - Bld Cx NTD - ESR (50) and CRP (2.9) - wound care consult, appreciate recs - ortho consulted, appreciate assistance     - ID onboard, appreciate assistance     - he has failed outpt abx; we don't have an culture data at this point to guide our treatment; discussed with ID/ortho, we need a Bx and Cx of the bone; IR is unable to do this, so he is now scheduled for OR on 03/06/19     - patient has elected to have amputation; scheduled for 03/06/2019; will await Cx data for abx  Hypothyroidism - Synthroid  Dyslipidemia: - Crestor,Zetia, fenofibrate  Gout: - prn Indocin  Anxiety state - was on PRN xanax; denies complaint today  Essential hypertension - Continue home amlodipine, clonidine, Prinzide, metoprolol     - becoming significantly bradycardic; hold parameters placed on metoprolol and dosing decreased  Coronary atherosclerosis - s/p of stent. No CP. - continueCrestor, Zetia, fenofibrate metoprolol, PRN nitroglycerin  Diabetes mellitus with complication, withdiabetic foot ulcer - Last A1c8.0 on 10/03/18, poorly controled. Patient is takingglipizide and metforminat home - SSI  Hypokalemia/Hypomagnesemia/Hypophosphatemia - replete and monitor   DVT prophylaxis: heparin Code Status: FULL   Disposition Plan: TBD   Consultants:   Orthopedics  ID    Subjective: "I decided to have him cut it off."  Objective: Vitals:   03/06/19 1441 03/06/19 1442 03/06/19 1443 03/06/19 1445  BP:   117/71   Pulse: (!) 50 (!) 46 (!) 45 (!) 50  Resp: _0 Temp:      TempSrc:      SpO2: 100% 100% 100% 100%  Weight:      Height:        Intake/Output Summary (Last 24 hours) at 03/06/2019 1551 Last data filed at 03/06/2019 0900 Gross per 24 hour  Intake 546 ml  Output 225 ml  Net 321 ml   Filed Weights   03/02/19 2047 03/03/19 0130 03/06/19 1350  Weight: 96.2 kg 94.3 kg 94.3 kg    Examination:  General:58 y.o.maleresting in bed in NAD Cardiovascular: RRR, +S1, S2, no m/g/r, equal pulses throughout Respiratory: CTABL, no w/r/r,  normal WOB GI: BS+, NDNT, no masses noted, no organomegaly noted MSK: No e/c/c; LLE bandaging C/D/I Neuro: A&O x 3, no focal deficits    Data Reviewed: I have personally reviewed following labs and imaging studies.  CBC: Recent Labs  Lab 03/02/19 2201 03/03/19 0311 03/04/19 0338 03/05/19 0245 03/06/19 0223  WBC 10.9* 8.3 5.3 6.2 7.4  NEUTROABS 9.4*  --   --  4.2 5.2  HGB 11.6* 10.6* 10.4* 11.2* 10.7*  HCT 35.6* 34.1* 34.4* 37.6* 34.5*  MCV 93.2 95.3 97.7 97.4 98.0  PLT 283 273 258 282 975   Basic Metabolic Panel: Recent Labs  Lab 03/02/19 2201 03/03/19 0311 03/04/19 0338 03/05/19 0245 03/06/19 0223  NA 135 140 142 139 138  K 3.3* 3.7 3.4* 3.3* 3.5  CL 106 108 109 102 103  CO2 20* _0 GLUCOSE 203* 131* 172* 216* 169*  BUN _1 CREATININE 0.89 0.87 0.90 1.01 0.82  CALCIUM 8.7* 8.9 8.6* 8.7* 8.6*  MG  --  1.4* 1.6* 1.5* 1.6*  PHOS  --   --   --  2.3* 2.8   GFR: Estimated Creatinine Clearance: 115.7 mL/min (by C-G formula based on SCr of 0.82 mg/dL). Liver Function Tests: Recent Labs  Lab 03/02/19 2201 03/04/19 0338 03/05/19 0245 03/06/19 0223  AST 14* 16  --   --   ALT 15 15  --   --   ALKPHOS 69 53  --   --   BILITOT 0.5 0.5  --   --   PROT 6.9 6.8  --   --   ALBUMIN 3.3* 3.0* 3.2* 3.0*   No results for input(s): LIPASE, AMYLASE in the last 168 hours. No results for input(s): AMMONIA in the last 168 hours. Coagulation Profile: Recent Labs  Lab 03/03/19 0311  INR 1.1   Cardiac Enzymes: No results for input(s): CKTOTAL, CKMB, CKMBINDEX, TROPONINI in the last 168 hours. BNP (last 3 results) No results for input(s): PROBNP in the last 8760 hours. HbA1C: No results for input(s): HGBA1C in the last 72 hours. CBG: Recent Labs  Lab 03/05/19 1131 03/05/19 1546 03/05/19 2224 03/06/19 0719 03/06/19 1405  GLUCAP 152* 209* 169* 103* 138*   Lipid Profile: No results for input(s): CHOL, HDL, LDLCALC, TRIG, CHOLHDL, LDLDIRECT in the  last 72 hours. Thyroid Function Tests: No results for input(s): TSH, T4TOTAL, FREET4, T3FREE, THYROIDAB in the last 72 hours. Anemia Panel: No results for input(s): VITAMINB12, FOLATE, FERRITIN, TIBC, IRON, RETICCTPCT in the last 72 hours. Sepsis Labs: Recent Labs  Lab 03/02/19 2201 03/02/19 2352  LATICACIDVEN 0.9 0.9    Recent Results (from the past 240 hour(s))  Culture, blood (routine x 2)     Status: None (Preliminary result)   Collection Time: 03/02/19 10:01 PM   Specimen: BLOOD LEFT FOREARM  Result Value Ref [place] Status   Specimen Description   Final    BLOOD LEFT FOREARM Performed at [facility], [address] [place], [place] [address]    Special Requests   Final    BOTTLES DRAWN AEROBIC AND ANAEROBIC Blood Culture adequate volume Performed at [facility], [address] [place], [place] [address]    Culture   Final    NO GROWTH 3 DAYS Performed at [facility], [place] [address] [place], [place] [address]    Report Status PENDING  Incomplete  SARS Coronavirus 2 (Hosp order,Performed in [facility] lab via [employer] ID)     Status: None   Collection Time: 03/02/19 10:01 PM   Specimen: Dry Nasal Swab ([employer] ID Now)  Result Value Ref [place] Status   SARS Coronavirus 2 ([employer] ID Now) NEGATIVE NEGATIVE Final    Comment: (NOTE) SARS-CoV-2 target nucleic acids are NOT DETECTED. The SARS-CoV-2 RNA is generally detectable in upper and lower respiratory specimens during the acute phase of infection.  Negativeresults do not preclude SARS-CoV-2 infection, do not rule out coinfections with other pathogens, and should not be used as the  sole basis for treatment or other patient management decisions.  Negative results must be combined with clinical observations, patient history, and epidemiological information. The expected result is Negative. Fact Sheet for Patients: GolfingFamily.no Fact Sheet for Healthcare  Providers: https://www.hernandez-brewer.com/ This test is not yet approved or cleared by the [place] FDA and  has been authorized for detection and/or diagnosis of SARS-CoV-2 by FDA under an Emergency Use Authorization (EUA).  This EUA will remain in effect (meaning this test can be used) for the duration of  the COVID19 declaration under Section 5 64(b)(1) of the Act, 21 U.S.C.  section [phone] 3(b)(1), unless the authorization is terminated or revoked sooner. Performed at [facility], [address] [place], [place] [address]   Culture, blood (routine x 2)     Status: None (Preliminary result)   Collection Time: 03/02/19 10:04 PM   Specimen: BLOOD LEFT HAND  Result Value Ref [place] Status   Specimen Description   Final    BLOOD LEFT HAND Performed at [facility], [address] [place], [place] [address]    Special Requests   Final    BOTTLES DRAWN AEROBIC AND ANAEROBIC Blood Culture results may not be optimal due to an inadequate volume of blood received in culture bottles Performed at [facility], [address] [place], [place] [address]    Culture   Final    NO GROWTH 3 DAYS Performed at [facility], [place] [address] [place], [place] [address]    Report Status PENDING  Incomplete  Surgical PCR screen     Status: Abnormal   Collection Time: 03/03/19  2:02 AM   Specimen: Nasal Mucosa; Nasal Swab  Result Value Ref [place] Status   MRSA, PCR POSITIVE (A) NEGATIVE Final    Comment: CRITICAL RESULT CALLED TO, READ BACK BY AND VERIFIED WITH: RN KNAPP AT 0413 03/03/19 CRUICKSHANK A    Staphylococcus aureus POSITIVE (A) NEGATIVE Final    Comment: (NOTE) The Xpert SA Assay (FDA approved for NASAL specimens in patients 31 years of age and older), is one component of a comprehensive surveillance program. It is not intended to diagnose infection nor to guide or monitor treatment. Performed at [facility],  [place] [address] [place], [place] [address]      Radiology Studies: No results found.   Scheduled Meds: . [MAR Hold] amLODipine  10 mg Oral Daily  . chlorhexidine  60 mL Topical Once  . [MAR Hold] Chlorhexidine Gluconate Cloth  6 each Topical Daily  . [MAR Hold] cloNIDine  0.3 mg Oral BID  . [MAR Hold] collagenase  1 application Topical Daily  . [MAR Hold] ezetimibe  10 mg Oral Daily  . [MAR Hold] fenofibrate  160 mg Oral Daily  . [MAR Hold] heparin  5,000 Units Subcutaneous Q8H  . [MAR Hold] lisinopril  20 mg Oral BID   And  . [MAR Hold] hydrochlorothiazide  12.5 mg Oral BID  . [MAR Hold] insulin aspart  0-5 Units Subcutaneous QHS  . [MAR Hold] insulin aspart  0-9 Units Subcutaneous TID WC  . [MAR Hold] levothyroxine  112 mcg Oral Daily  . [MAR Hold] magnesium oxide  400 mg Oral BID  . [START ON 03/07/2019] metoprolol succinate  100 mg Oral Daily  . [MAR Hold] mupirocin ointment  1 application Nasal BID  . [MAR Hold] potassium chloride  40 mEq Oral BID  . [MAR Hold] potassium phosphate (monobasic)  500 mg Oral TID WC & HS  . [MAR Hold] rosuvastatin  40 mg Oral Daily   Continuous Infusions: . lactated ringers 50 mL/hr at 03/06/19 1345     LOS: 3 days    Time spent: 25 minutes spent in the coordination of care today.    Jonnie Finner, DO [facility] Pager [phone]  If 7PM-7AM, please contact night-coverage [email or web address] Password [facility] 03/06/2019, 3:51 PM

## 2019-03-07 ENCOUNTER — Encounter (HOSPITAL_COMMUNITY): Payer: Self-pay | Admitting: Orthopedic Surgery

## 2019-03-07 LAB — RENAL FUNCTION PANEL
Albumin: 3.5 g/dL (ref 3.5–5.0)
Anion gap: 9 (ref 5–15)
BUN: 16 mg/dL (ref 6–20)
CO2: 28 mmol/L (ref 22–32)
Calcium: 9.2 mg/dL (ref 8.9–10.3)
Chloride: 104 mmol/L (ref 98–111)
Creatinine, Ser: 0.92 mg/dL (ref 0.61–1.24)
GFR calc Af Amer: >60 mL/min (ref >60)
GFR calc non Af Amer: >60 mL/min (ref >60)
Glucose, Bld: 151 mg/dL — ABNORMAL HIGH (ref 70–99)
Phosphorus: 3.4 mg/dL (ref 2.5–4.6)
Potassium: 5 mmol/L (ref 3.5–5.1)
Sodium: 141 mmol/L (ref 135–145)

## 2019-03-07 LAB — CBC WITH DIFFERENTIAL/PLATELET
Abs Immature Granulocytes: 0.07 10*3/uL (ref 0.00–0.07)
Basophils Absolute: 0 10*3/uL (ref 0.0–0.1)
Basophils Relative: 1 %
Eosinophils Absolute: 0.2 10*3/uL (ref 0.0–0.5)
Eosinophils Relative: 2 %
HCT: 37.3 % — ABNORMAL LOW (ref 39.0–52.0)
Hemoglobin: 11.2 g/dL — ABNORMAL LOW (ref 13.0–17.0)
Immature Granulocytes: 1 %
Lymphocytes Relative: 10 %
Lymphs Abs: 0.9 10*3/uL (ref 0.7–4.0)
MCH: 29.4 pg (ref 26.0–34.0)
MCHC: 30 g/dL (ref 30.0–36.0)
MCV: 97.9 fL (ref 80.0–100.0)
Monocytes Absolute: 1 10*3/uL (ref 0.1–1.0)
Monocytes Relative: 11 %
Neutro Abs: 6.6 10*3/uL (ref 1.7–7.7)
Neutrophils Relative %: 75 %
Platelets: 276 10*3/uL (ref 150–400)
RBC: 3.81 MIL/uL — ABNORMAL LOW (ref 4.22–5.81)
RDW: 12.6 % (ref 11.5–15.5)
WBC: 8.7 10*3/uL (ref 4.0–10.5)
nRBC: 0 % (ref 0.0–0.2)

## 2019-03-07 LAB — MAGNESIUM: Magnesium: 1.9 mg/dL (ref 1.7–2.4)

## 2019-03-07 LAB — GLUCOSE, CAPILLARY
Glucose-Capillary: 139 mg/dL — ABNORMAL HIGH (ref 70–99)
Glucose-Capillary: 199 mg/dL — ABNORMAL HIGH (ref 70–99)
Glucose-Capillary: 179 mg/dL — ABNORMAL HIGH (ref 70–99)
Glucose-Capillary: 161 mg/dL — ABNORMAL HIGH (ref 70–99)

## 2019-03-07 MED ORDER — CYCLOBENZAPRINE HCL 5 MG PO TABS
5.0000 mg | ORAL_TABLET | Freq: Three times a day (TID) | ORAL | Status: DC | PRN
  Administered 2019-03-07 – 2019-03-08 (×3): 5 mg via ORAL
  Filled 2019-03-07 (×3): qty 1

## 2019-03-07 MED ORDER — OXYCODONE-ACETAMINOPHEN 5-325 MG PO TABS
1.0000 | ORAL_TABLET | Freq: Four times a day (QID) | ORAL | Status: DC | PRN
  Administered 2019-03-07 – 2019-03-09 (×8): 1 via ORAL
  Filled 2019-03-07 (×8): qty 1

## 2019-03-07 MED ORDER — VANCOMYCIN HCL 10 G IV SOLR
1500.0000 mg | Freq: Two times a day (BID) | INTRAVENOUS | Status: DC
  Administered 2019-03-07 – 2019-03-09 (×5): 1500 mg via INTRAVENOUS
  Filled 2019-03-07 (×6): qty 1500

## 2019-03-07 MED ORDER — SODIUM CHLORIDE 0.9 % IV SOLN
2.0000 g | INTRAVENOUS | Status: DC
  Administered 2019-03-07 – 2019-03-09 (×3): 2 g via INTRAVENOUS
  Filled 2019-03-07: qty 2
  Filled 2019-03-07: qty 20
  Filled 2019-03-07: qty 2

## 2019-03-07 NOTE — Progress Notes (Signed)
Pharmacy Antibiotic Note  Robert Huang is a 58 y.o. male admitted on 03/02/2019 with osteomyelitis.  Pharmacy has been consulted for vanc dosing.  Plan: Vancomycin 1500mg  IV q12 - goal AUC 400-550  Height: 6' (182.9 cm) Weight: 207 lb 12.8 oz (94.3 kg) IBW/kg (Calculated) : 77.6  Temp (24hrs), Avg:98.2 F (36.8 C), Min:97.4 F (36.3 C), Max:98.6 F (37 C)  Recent Labs  Lab 03/02/19 2201 03/02/19 2352 03/03/19 0311 03/04/19 0338 03/05/19 0245 03/06/19 0223 03/07/19 0436  WBC 10.9*  --  8.3 5.3 6.2 7.4 8.7  CREATININE 0.89  --  0.87 0.90 1.01 0.82 0.92  LATICACIDVEN 0.9 0.9  --   --   --   --   --     Estimated Creatinine Clearance: 103.1 mL/min (by C-G formula based on SCr of 0.92 mg/dL).    No Known Allergies   Thank you for allowing pharmacy to be a part of this patient's care.  Kara Mead 03/07/2019 11:27 AM

## 2019-03-07 NOTE — Progress Notes (Signed)
Orthopedics Progress Note  Subjective: Pain controlled but patient requesting something stronger than hydrocodone for discharge as he has those already at home and says they are not strong enough.   Objective:  Vitals:   03/07/19 0129 03/07/19 0516  BP: 130/72 (!) 145/77  Pulse: (!) 43 (!) 52  Resp: 16 16  Temp: 98.3 F (36.8 C) 98.4 F (36.9 C)  SpO2: 100% 99%    General: Awake and alert  Musculoskeletal: left foot dressing intact. Foot elevated Neurovascularly intact  Lab Results  Component Value Date   WBC 8.7 03/07/2019   HGB 11.2 (L) 03/07/2019   HCT 37.3 (L) 03/07/2019   MCV 97.9 03/07/2019   PLT 276 03/07/2019       Component Value Date/Time   NA 141 03/07/2019 0436   K 5.0 03/07/2019 0436   CL 104 03/07/2019 0436   CO2 28 03/07/2019 0436   GLUCOSE 151 (H) 03/07/2019 0436   BUN 16 03/07/2019 0436   CREATININE 0.92 03/07/2019 0436   CALCIUM 9.2 03/07/2019 0436   GFRNONAA >60 03/07/2019 0436   GFRAA >60 03/07/2019 0436    Lab Results  Component Value Date   INR 1.1 03/03/2019   INR 1.0 10/28/2008   INR 1.0 10/27/2008    Assessment/Plan: POD #1 s/p Procedure(s): Ray Amputation left foot Discussed with Dr Stann Mainland who would like to reinforce dressing as needed but no bandage change. Patient will cancel appointment with [facility] and with wound care clinic. Follow up with with Dr Stann Mainland in three weeks Recommend broad spectrum empiric abx for suspected polymicrobial infection to cover the wound.  Infected bone was removed with the amputation. Toe was sent to pathology, no specimen to micro  Remo Lipps R. Veverly Fells, MD 03/07/2019 10:06 AM

## 2019-03-07 NOTE — Progress Notes (Signed)
Robert Huang  PROGRESS NOTE    Robert Huang  EHU:[id number] DOB: [birth year] DOA: 03/02/2019 PCP: [name], MD   Brief Narrative:   Robert Huang a 58 y.o.malewith medical history significant ofhypertension, hyperlipidemia, diabetes mellitus, CAD, stent placement, hypothyroidism, gout, anxiety, who presents with left foot wound with infection.  Patient states that he has a wound in left lateral foot, which has been going on for 6 weeks.Pt was seen by PAphysicianinorthopedic surgeon, Dr. Geannie Risen onWednesday. Patient was started with Keflex.He reports that he has been taking Keflex,but no significant help. He developed fever and chills, with temperature 101.5today. Patient states that he has mildpain in left foot on walking.No chest pain, shortness breath, cough. He has nausea, no vomiting, diarrhea or abdominal pain. No symptoms of UTI or unilateral weakness. Patient states that he was scheduled for MRI of left foot to rule out osteomyelitis by orthopedic surgeon, but has not done yet.   Assessment & Plan:   Principal Problem:   Left foot infection Active Problems:   Hypothyroidism   Dyslipidemia   Gout   Anxiety state   Essential hypertension   Coronary atherosclerosis   Diabetes mellitus with complication (HCC)   Diabetic foot ulcers (HCC)   Hypokalemia   Left footdiabetic foot ulcerswithinfection - ptdeveloped fever of101.5 and chills; mild leukocytosis with WBC 10.9. - Pt failedoutpatient antibiotic of Keflex treatment. - IV abx with vanc/ceftriaxone/flagyl; abx held for surgery/Cx data - MRI with evidence of osteomyelitis throughout 5th metatarsal with skin ulceration and surrounding cellulitis noted as well - ABI's wnl (preliminary study) - PRN Zofran for nausea,Norcofor pain - Bld Cx NTD - ESR (50) and CRP (2.9) - wound care consult, appreciate recs - ortho consulted, appreciate assistance  - ID onboard, appreciate assistance - he has failed outpt abx; we don't have an culture data at this point to guide our treatment; discussed with ID/ortho, we need a Bx and Cx of the bone; IR is unable to do this, so he is now scheduled for OR on 03/06/19     - patient has elected to have amputation; scheduled for 03/06/2019; will await Cx data for abx  Hypothyroidism - Synthroid  Dyslipidemia: - Crestor,Zetia, fenofibrate  Gout: - prn Indocin  Anxiety state - was on PRN xanax; denies complaint today  Essential hypertension - Continue home amlodipine, clonidine, Prinzide, metoprolol     - becoming significantly bradycardic; hold parameters placed on metoprolol and dosing decreased  Coronary atherosclerosis - s/p of stent. No CP. - continueCrestor, Zetia, fenofibrate metoprolol, PRN nitroglycerin  Diabetes mellitus with complication, withdiabetic foot ulcer - Last A1c8.0 on 10/03/18, poorly controled. Patient is takingglipizide and metforminat home - SSI  Hypokalemia/Hypomagnesemia/Hypophosphatemia -replete and monitor  Now s/p amputation. Needs 2 weeks additional abx per ID. Awaiting Cx data. Continue current abx.  DVT prophylaxis:heparin Code Status:FULL Disposition Plan:TBD   Consultants:   Orthopedics  ID  Procedures:  Ray amputation of fifth ray left foot  Antimicrobials:  . Rocephin, vanc    Subjective: "The pain is starting to breakthrough."  Objective: Vitals:   03/07/19 0129 03/07/19 0516 03/07/19 1026 03/07/19 1300  BP: 130/72 (!) 145/77 135/73 (!) 151/85  Pulse: (!) 43 (!) 52 64 63  Resp: 16 16  16   Temp: 98.3 F (36.8 C) 98.4 F (36.9 C)  98.2 F (36.8 C)  TempSrc: Oral Oral  Oral  SpO2: 100% 99% 100% 100%  Weight:      Height:        Intake/Output  Summary (Last 24 hours) at 03/07/2019 1548 Last data filed at 03/07/2019 0900 Gross per 24 hour  Intake 1640 ml  Output 1150  ml  Net 490 ml   Filed Weights   03/02/19 2047 03/03/19 0130 03/06/19 1350  Weight: 96.2 kg 94.3 kg 94.3 kg    Examination:  General:58 y.o.maleresting in bed in NAD Cardiovascular: RRR, +S1, S2, no m/g/r, equal pulses throughout Respiratory: CTABL, no w/r/r, normal WOB GI: BS+, NDNT, no masses noted, no organomegaly noted MSK: No e/c/c; LLE bandaging C/D/I Neuro: A&O x 3, no focal deficits    Data Reviewed: I have personally reviewed following labs and imaging studies.  CBC: Recent Labs  Lab 03/02/19 2201 03/03/19 0311 03/04/19 0338 03/05/19 0245 03/06/19 0223 03/07/19 0436  WBC 10.9* 8.3 5.3 6.2 7.4 8.7  NEUTROABS 9.4*  --   --  4.2 5.2 6.6  HGB 11.6* 10.6* 10.4* 11.2* 10.7* 11.2*  HCT 35.6* 34.1* 34.4* 37.6* 34.5* 37.3*  MCV 93.2 95.3 97.7 97.4 98.0 97.9  PLT 283 273 258 282 286 416   Basic Metabolic Panel: Recent Labs  Lab 03/03/19 0311 03/04/19 0338 03/05/19 0245 03/06/19 0223 03/07/19 0436  NA 140 142 139 138 141  K 3.7 3.4* 3.3* 3.5 5.0  CL 108 109 102 103 104  CO2 26 27 26 26 28   GLUCOSE 131* 172* 216* 169* 151*  BUN 13 10 9 11 16   CREATININE 0.87 0.90 1.01 0.82 0.92  CALCIUM 8.9 8.6* 8.7* 8.6* 9.2  MG 1.4* 1.6* 1.5* 1.6* 1.9  PHOS  --   --  2.3* 2.8 3.4   GFR: Estimated Creatinine Clearance: 103.1 mL/min (by C-G formula based on SCr of 0.92 mg/dL). Liver Function Tests: Recent Labs  Lab 03/02/19 2201 03/04/19 0338 03/05/19 0245 03/06/19 0223 03/07/19 0436  AST 14* 16  --   --   --   ALT 15 15  --   --   --   ALKPHOS 69 53  --   --   --   BILITOT 0.5 0.5  --   --   --   PROT 6.9 6.8  --   --   --   ALBUMIN 3.3* 3.0* 3.2* 3.0* 3.5   No results for input(s): LIPASE, AMYLASE in the last 168 hours. No results for input(s): AMMONIA in the last 168 hours. Coagulation Profile: Recent Labs  Lab 03/03/19 0311  INR 1.1   Cardiac Enzymes: No results for input(s): CKTOTAL, CKMB, CKMBINDEX, TROPONINI in the last 168 hours. BNP (last 3  results) No results for input(s): PROBNP in the last 8760 hours. HbA1C: No results for input(s): HGBA1C in the last 72 hours. CBG: Recent Labs  Lab 03/06/19 1606 03/06/19 1653 03/06/19 2141 03/07/19 0729 03/07/19 1129  GLUCAP 148* 140* 175* 139* 199*   Lipid Profile: No results for input(s): CHOL, HDL, LDLCALC, TRIG, CHOLHDL, LDLDIRECT in the last 72 hours. Thyroid Function Tests: No results for input(s): TSH, T4TOTAL, FREET4, T3FREE, THYROIDAB in the last 72 hours. Anemia Panel: No results for input(s): VITAMINB12, FOLATE, FERRITIN, TIBC, IRON, RETICCTPCT in the last 72 hours. Sepsis Labs: Recent Labs  Lab 03/02/19 2201 03/02/19 2352  LATICACIDVEN 0.9 0.9    Recent Results (from the past 240 hour(s))  Culture, blood (routine x 2)     Status: None (Preliminary result)   Collection Time: 03/02/19 10:01 PM   Specimen: BLOOD LEFT FOREARM  Result Value Ref Range Status   Specimen Description   Final  BLOOD LEFT FOREARM Performed at [facility], [address] [place], [place] [address]    Special Requests   Final    BOTTLES DRAWN AEROBIC AND ANAEROBIC Blood Culture adequate volume Performed at [facility], [address] [place], [place] [address]    Culture   Final    NO GROWTH 3 DAYS Performed at [facility], [place] [address] [place], [place] [address]    Report Status PENDING  Incomplete  SARS Coronavirus 2 (Hosp order,Performed in [facility] lab via Abbott ID)     Status: None   Collection Time: 03/02/19 10:01 PM   Specimen: Dry Nasal Swab (Abbott ID Now)  Result Value Ref Range Status   SARS Coronavirus 2 (Abbott ID Now) NEGATIVE NEGATIVE Final    Comment: (NOTE) SARS-CoV-2 target nucleic acids are NOT DETECTED. The SARS-CoV-2 RNA is generally detectable in upper and lower respiratory specimens during the acute phase of infection.  Negativeresults do not preclude SARS-CoV-2 infection, do not rule out coinfections with other  pathogens, and should not be used as the  sole basis for treatment or other patient management decisions.  Negative results must be combined with clinical observations, patient history, and epidemiological information. The expected result is Negative. Fact Sheet for Patients: GolfingFamily.no Fact Sheet for Healthcare Providers: https://www.hernandez-brewer.com/ This test is not yet approved or cleared by the [place] FDA and  has been authorized for detection and/or diagnosis of SARS-CoV-2 by FDA under an Emergency Use Authorization (EUA).  This EUA will remain in effect (meaning this test can be used) for the duration of  the COVID19 declaration under Section 5 64(b)(1) of the Act, 21 U.S.C.  section [phone] 3(b)(1), unless the authorization is terminated or revoked sooner. Performed at [facility], [address] [place], [place] [address]   Culture, blood (routine x 2)     Status: None (Preliminary result)   Collection Time: 03/02/19 10:04 PM   Specimen: BLOOD LEFT HAND  Result Value Ref Range Status   Specimen Description   Final    BLOOD LEFT HAND Performed at [facility], [address] [place], [place] [address]    Special Requests   Final    BOTTLES DRAWN AEROBIC AND ANAEROBIC Blood Culture results may not be optimal due to an inadequate volume of blood received in culture bottles Performed at [facility], [address] [place], [place] [address]    Culture   Final    NO GROWTH 3 DAYS Performed at [facility], [place] [address] [place], [place] [address]    Report Status PENDING  Incomplete  Surgical PCR screen     Status: Abnormal   Collection Time: 03/03/19  2:02 AM   Specimen: Nasal Mucosa; Nasal Swab  Result Value Ref Range Status   MRSA, PCR POSITIVE (A) NEGATIVE Final    Comment: CRITICAL RESULT CALLED TO, READ BACK BY AND VERIFIED WITH: RN KNAPP AT 0413 03/03/19 CRUICKSHANK A     Staphylococcus aureus POSITIVE (A) NEGATIVE Final    Comment: (NOTE) The Xpert SA Assay (FDA approved for NASAL specimens in patients 55 years of age and older), is one component of a comprehensive surveillance program. It is not intended to diagnose infection nor to guide or monitor treatment. Performed at [facility], [place] [address], [place], [place] [address]      Radiology Studies: No results found.  Scheduled Meds: . amLODipine  10 mg Oral Daily  . Chlorhexidine Gluconate Cloth  6 each Topical Daily  . cloNIDine  0.3 mg Oral BID  . collagenase  1 application Topical Daily  . docusate sodium  100 mg Oral BID  . ezetimibe  10 mg Oral Daily  . fenofibrate  160 mg Oral Daily  . heparin  5,000 Units Subcutaneous Q8H  . lisinopril  20 mg Oral BID   And  . hydrochlorothiazide  12.5 mg Oral BID  . insulin aspart  0-5 Units Subcutaneous QHS  . insulin aspart  0-9 Units Subcutaneous TID WC  . levothyroxine  112 mcg Oral Daily  . magnesium oxide  400 mg Oral BID  . metoprolol succinate  100 mg Oral Daily  . mupirocin ointment  1 application Nasal BID  . potassium chloride  40 mEq Oral BID  . potassium phosphate (monobasic)  500 mg Oral TID WC & HS  . rosuvastatin  40 mg Oral Daily   Continuous Infusions: . cefTRIAXone (ROCEPHIN)  IV 2 g (03/07/19 1356)  . vancomycin 1,500 mg (03/07/19 1522)     LOS: 4 days    Time spent: 25 minutes spent in the coordination of care today.    Jonnie Finner, DO [facility] Pager [phone]  If 7PM-7AM, please contact night-coverage [email or web address] Password [facility] 03/07/2019, 3:48 PM

## 2019-03-08 ENCOUNTER — Inpatient Hospital Stay: Payer: Self-pay

## 2019-03-08 LAB — CULTURE, BLOOD (ROUTINE X 2)
Culture: NO GROWTH
Culture: NO GROWTH
Special Requests: ADEQUATE

## 2019-03-08 LAB — CREATININE, SERUM
Creatinine, Ser: 0.98 mg/dL (ref 0.61–1.24)
GFR calc Af Amer: >60 mL/min (ref >60)
GFR calc non Af Amer: >60 mL/min (ref >60)

## 2019-03-08 LAB — GLUCOSE, CAPILLARY
Glucose-Capillary: 143 mg/dL — ABNORMAL HIGH (ref 70–99)
Glucose-Capillary: 244 mg/dL — ABNORMAL HIGH (ref 70–99)
Glucose-Capillary: 151 mg/dL — ABNORMAL HIGH (ref 70–99)
Glucose-Capillary: 155 mg/dL — ABNORMAL HIGH (ref 70–99)

## 2019-03-08 MED ORDER — SODIUM CHLORIDE 0.9% FLUSH: 10.0000 mL | INTRAVENOUS | Status: DC | PRN

## 2019-03-08 MED ORDER — SODIUM CHLORIDE 0.9% FLUSH: 10.0000 mL | Freq: Two times a day (BID) | INTRAVENOUS | Status: DC

## 2019-03-08 NOTE — Progress Notes (Signed)
Spoke with Dr. Prince Rome concerning PICC order for home antibiotics. Informed Dr. Prince Rome that we typically only send home patients with antibiotics with a single lumen PICC. He specifically wanted a DL PICC, so orders were changed to place a DL for home antibiotics.

## 2019-03-08 NOTE — Progress Notes (Signed)
Robert Huang  PROGRESS NOTE    EMRE STOCK  OEH:[id number] DOB: 04-27-1961 DOA: 03/02/2019 PCP: [name], MD   Brief Narrative:   Robert Huang a 58 y.o.malewith medical history significant ofhypertension, hyperlipidemia, diabetes mellitus, CAD, stent placement, hypothyroidism, gout, anxiety, who presents with left foot wound with infection.  Patient states that he has a wound in left lateral foot, which has been going on for 6 weeks.Pt was seen by PAphysicianinorthopedic surgeon, Dr. Geannie Risen onWednesday. Patient was started with Keflex.He reports that he has been taking Keflex,but no significant help. He developed fever and chills, with temperature 101.5today. Patient states that he has mildpain in left foot on walking.No chest pain, shortness breath, cough. He has nausea, no vomiting, diarrhea or abdominal pain. No symptoms of UTI or unilateral weakness. Patient states that he was scheduled for MRI of left foot to rule out osteomyelitis by orthopedic surgeon, but has not done yet.   Assessment & Plan:   Principal Problem:   Left foot infection Active Problems:   Hypothyroidism   Dyslipidemia   Gout   Anxiety state   Essential hypertension   Coronary atherosclerosis   Diabetes mellitus with complication (HCC)   Diabetic foot ulcers (HCC)   Hypokalemia   Left footdiabetic foot ulcerswithinfection - ptdeveloped fever of101.5 and chills; mild leukocytosis with WBC 10.9. - Pt failedoutpatient antibiotic of Keflex treatment. - IV abx with vanc/ceftriaxone/flagyl; abx held for surgery/Cx data - MRI with evidence of osteomyelitis throughout 5th metatarsal with skin ulceration and surrounding cellulitis noted as well - ABI's wnl (preliminary study) - PRN Zofran for nausea,Norcofor pain - Bld Cx NTD - ESR (50) and CRP (2.9) - wound care consult, appreciate recs - ortho consulted, appreciate assistance  - ID onboard, appreciate assistance - he has failed outpt abx; we don't have an culture data at this point to guide our treatment; discussed with ID/ortho, we need a Bx and Cx of the bone; IR is unable to do this, so he is now scheduled for OR on 03/06/19 - s/p ray amputation     - no sample sent for Cx; ID to continue vanc/rocephin; needs vanc trough, will get 03/09/2019     - PICC ordered for 2 weeks IV abx  Hypothyroidism - Synthroid  Dyslipidemia: - Crestor,Zetia, fenofibrate  Gout: - prn Indocin  Anxiety state - was on PRN xanax; denies complaint today  Essential hypertension - Continue home amlodipine, clonidine, Prinzide, metoprolol - becoming significantly bradycardic; hold parameters placed on metoprolol and dosing decreased  Coronary atherosclerosis - s/p of stent. No CP. - continueCrestor, Zetia, fenofibrate metoprolol, PRN nitroglycerin  Diabetes mellitus with complication, withdiabetic foot ulcer - Last A1c8.0 on 10/03/18, poorly controled. Patient is takingglipizide and metforminat home - SSI  Hypokalemia/Hypomagnesemia/Hypophosphatemia -replete and monitor  Will go home on vanc/rocephin tomorrow after vanc trough (need for dosing)  DVT prophylaxis:heparin Code Status:FULL Disposition Plan:TBD   Consultants:   Orthopedics  ID  Procedures:   Ray amputation of fifth toe, left foot  Antimicrobials:  . Rocephin, vanc    Subjective: "It was interesting."  Objective: Vitals:   03/07/19 1300 03/07/19 1740 03/07/19 2114 03/08/19 0517  BP: (!) 151/85 (!) 160/79 (!) 153/65 (!) 161/71  Pulse: 63 65 84 (!) 58  Resp: 16  16 16   Temp: 98.2 F (36.8 C)  99.2 F (37.3 C) 98.6 F (37 C)  TempSrc: Oral  Oral Oral  SpO2: 100%  98% 99%  Weight:      Height:  Intake/Output Summary (Last 24 hours) at 03/08/2019 1311 Last data filed at 03/08/2019 0700 Gross per 24 hour   Intake 1616.05 ml  Output 1925 ml  Net -308.95 ml   Filed Weights   03/02/19 2047 03/03/19 0130 03/06/19 1350  Weight: 96.2 kg 94.3 kg 94.3 kg    Examination:  General:58 y.o.maleresting in bed in NAD Cardiovascular: RRR, +S1, S2, no m/g/r, equal pulses throughout Respiratory: CTABL, no w/r/r, normal WOB GI: BS+, NDNT, no masses noted, no organomegaly noted MSK: No e/c/c; LLE bandaging C/D/I Neuro: A&O x 3, no focal deficits    Data Reviewed: I have personally reviewed following labs and imaging studies.  CBC: Recent Labs  Lab 03/02/19 2201 03/03/19 0311 03/04/19 0338 03/05/19 0245 03/06/19 0223 03/07/19 0436  WBC 10.9* 8.3 5.3 6.2 7.4 8.7  NEUTROABS 9.4*  --   --  4.2 5.2 6.6  HGB 11.6* 10.6* 10.4* 11.2* 10.7* 11.2*  HCT 35.6* 34.1* 34.4* 37.6* 34.5* 37.3*  MCV 93.2 95.3 97.7 97.4 98.0 97.9  PLT 283 273 258 282 286 048   Basic Metabolic Panel: Recent Labs  Lab 03/03/19 0311 03/04/19 0338 03/05/19 0245 03/06/19 0223 03/07/19 0436 03/08/19 0329  NA 140 142 139 138 141  --   K 3.7 3.4* 3.3* 3.5 5.0  --   CL 108 109 102 103 104  --   CO2 26 27 26 26 28   --   GLUCOSE 131* 172* 216* 169* 151*  --   BUN 13 10 9 11 16   --   CREATININE 0.87 0.90 1.01 0.82 0.92 0.98  CALCIUM 8.9 8.6* 8.7* 8.6* 9.2  --   MG 1.4* 1.6* 1.5* 1.6* 1.9  --   PHOS  --   --  2.3* 2.8 3.4  --    GFR: Estimated Creatinine Clearance: 96.8 mL/min (by C-G formula based on SCr of 0.98 mg/dL). Liver Function Tests: Recent Labs  Lab 03/02/19 2201 03/04/19 0338 03/05/19 0245 03/06/19 0223 03/07/19 0436  AST 14* 16  --   --   --   ALT 15 15  --   --   --   ALKPHOS 69 53  --   --   --   BILITOT 0.5 0.5  --   --   --   PROT 6.9 6.8  --   --   --   ALBUMIN 3.3* 3.0* 3.2* 3.0* 3.5   No results for input(s): LIPASE, AMYLASE in the last 168 hours. No results for input(s): AMMONIA in the last 168 hours. Coagulation Profile: Recent Labs  Lab 03/03/19 0311  INR 1.1   Cardiac  Enzymes: No results for input(s): CKTOTAL, CKMB, CKMBINDEX, TROPONINI in the last 168 hours. BNP (last 3 results) No results for input(s): PROBNP in the last 8760 hours. HbA1C: No results for input(s): HGBA1C in the last 72 hours. CBG: Recent Labs  Lab 03/07/19 1129 03/07/19 1619 03/07/19 2112 03/08/19 0716 03/08/19 1145  GLUCAP 199* 179* 161* 143* 244*   Lipid Profile: No results for input(s): CHOL, HDL, LDLCALC, TRIG, CHOLHDL, LDLDIRECT in the last 72 hours. Thyroid Function Tests: No results for input(s): TSH, T4TOTAL, FREET4, T3FREE, THYROIDAB in the last 72 hours. Anemia Panel: No results for input(s): VITAMINB12, FOLATE, FERRITIN, TIBC, IRON, RETICCTPCT in the last 72 hours. Sepsis Labs: Recent Labs  Lab 03/02/19 2201 03/02/19 2352  LATICACIDVEN 0.9 0.9    Recent Results (from the past 240 hour(s))  Culture, blood (routine x 2)     Status: None (  Preliminary result)   Collection Time: 03/02/19 10:01 PM   Specimen: BLOOD LEFT FOREARM  Result Value Ref Range Status   Specimen Description   Final    BLOOD LEFT FOREARM Performed at [facility], [address] [place], [place] [address]    Special Requests   Final    BOTTLES DRAWN AEROBIC AND ANAEROBIC Blood Culture adequate volume Performed at [facility], [address] [place], [place] [address]    Culture   Final    NO GROWTH 4 DAYS Performed at [facility], [place] [address] [place], [place] [address]    Report Status PENDING  Incomplete  SARS Coronavirus 2 (Hosp order,Performed in [facility] lab via Abbott ID)     Status: None   Collection Time: 03/02/19 10:01 PM   Specimen: Dry Nasal Swab (Abbott ID Now)  Result Value Ref Range Status   SARS Coronavirus 2 (Abbott ID Now) NEGATIVE NEGATIVE Final    Comment: (NOTE) SARS-CoV-2 target nucleic acids are NOT DETECTED. The SARS-CoV-2 RNA is generally detectable in upper and lower respiratory specimens during the acute phase of  infection.  Negativeresults do not preclude SARS-CoV-2 infection, do not rule out coinfections with other pathogens, and should not be used as the  sole basis for treatment or other patient management decisions.  Negative results must be combined with clinical observations, patient history, and epidemiological information. The expected result is Negative. Fact Sheet for Patients: GolfingFamily.no Fact Sheet for Healthcare Providers: https://www.hernandez-brewer.com/ This test is not yet approved or cleared by the [place] FDA and  has been authorized for detection and/or diagnosis of SARS-CoV-2 by FDA under an Emergency Use Authorization (EUA).  This EUA will remain in effect (meaning this test can be used) for the duration of  the COVID19 declaration under Section 5 64(b)(1) of the Act, 21 U.S.C.  section [phone] 3(b)(1), unless the authorization is terminated or revoked sooner. Performed at [facility], [address] [place], [place] [address]   Culture, blood (routine x 2)     Status: None (Preliminary result)   Collection Time: 03/02/19 10:04 PM   Specimen: BLOOD LEFT HAND  Result Value Ref Range Status   Specimen Description   Final    BLOOD LEFT HAND Performed at [facility], [address] [place], [place] [address]    Special Requests   Final    BOTTLES DRAWN AEROBIC AND ANAEROBIC Blood Culture results may not be optimal due to an inadequate volume of blood received in culture bottles Performed at [facility], [address] [place], [place] [address]    Culture   Final    NO GROWTH 4 DAYS Performed at [facility], [place] [address] [place], [place] [address]    Report Status PENDING  Incomplete  Surgical PCR screen     Status: Abnormal   Collection Time: 03/03/19  2:02 AM   Specimen: Nasal Mucosa; Nasal Swab  Result Value Ref Range Status   MRSA, PCR POSITIVE (A) NEGATIVE Final     Comment: CRITICAL RESULT CALLED TO, READ BACK BY AND VERIFIED WITH: RN KNAPP AT 0413 03/03/19 CRUICKSHANK A    Staphylococcus aureus POSITIVE (A) NEGATIVE Final    Comment: (NOTE) The Xpert SA Assay (FDA approved for NASAL specimens in patients 88 years of age and older), is one component of a comprehensive surveillance program. It is not intended to diagnose  infection nor to guide or monitor treatment. Performed at [facility], [place] [address] [place], [place] [address]          Radiology Studies: [place] Ekg Site Rite  Result Date: 03/08/2019 If [facility] image not attached, placement could not be confirmed due to current cardiac rhythm.       Scheduled Meds: . amLODipine  10 mg Oral Daily  . cloNIDine  0.3 mg Oral BID  . collagenase  1 application Topical Daily  . docusate sodium  100 mg Oral BID  . ezetimibe  10 mg Oral Daily  . fenofibrate  160 mg Oral Daily  . heparin  5,000 Units Subcutaneous Q8H  . lisinopril  20 mg Oral BID   And  . hydrochlorothiazide  12.5 mg Oral BID  . insulin aspart  0-5 Units Subcutaneous QHS  . insulin aspart  0-9 Units Subcutaneous TID WC  . levothyroxine  112 mcg Oral Daily  . magnesium oxide  400 mg Oral BID  . metoprolol succinate  100 mg Oral Daily  . potassium chloride  40 mEq Oral BID  . potassium phosphate (monobasic)  500 mg Oral TID WC & HS  . rosuvastatin  40 mg Oral Daily   Continuous Infusions: . cefTRIAXone (ROCEPHIN)  IV 2 g (03/08/19 1215)  . vancomycin Stopped (03/08/19 0528)     LOS: 5 days    Time spent: 35 minutes spent in the coordination of care today.    Robert Finner, DO [facility] Pager [phone]  If 7PM-7AM, please contact night-coverage [email or web address] Password [email or web address] 03/08/2019, 1:11 PM

## 2019-03-08 NOTE — Evaluation (Signed)
Physical Therapy Evaluation Patient Details Name: Robert Huang MRN: [id number] DOB: 1960-12-14 Today's Date: 03/08/2019   History of Present Illness  58 y.o. male with medical history significant of hypertension, hyperlipidemia, diabetes mellitus, CAD, stent placement, hypothyroidism, gout, anxiety, who presents with left foot wound with infection, s/p L 5th Ray amputation  Clinical Impression  Patient evaluated by Physical Therapy with no further acute PT needs identified. All education has been completed and the patient has no further questions.  Reviewed RW safety, pt will need RW for home. Reviewed gait with cane, overall safety. Pt is mildly unsteady at times but without overt LOB, recommend pt use RW until FWB. See below for any follow-up Physical Therapy or equipment needs. PT is signing off. Thank you for this referral.     Follow Up Recommendations No PT follow up    Equipment Recommendations  Rolling walker with 5" wheels    Recommendations for Other Services       Precautions / Restrictions Precautions Precautions: Fall Restrictions Weight Bearing Restrictions: Yes LLE Weight Bearing: Weight bearing as tolerated(WBAT through L heel in post op shoe)      Mobility  Bed Mobility               General bed mobility comments: NT,pt in recliner  Transfers Overall transfer level: Needs assistance Equipment used: Rolling walker (2 wheeled);None Transfers: Sit to/from Stand Sit to Stand: Supervision;Modified independent (Device/Increase time)         General transfer comment: cues for safety and hand placement  Ambulation/Gait Ambulation/Gait assistance: Min guard;Supervision Gait Distance (Feet): 300 Feet(25' more with cane--cane too short but not adjustable, pt is aware) Assistive device: Rolling walker (2 wheeled)       General Gait Details: cues for sequence and  safety, keeping both feet inside RW  Stairs Stairs: Yes Stairs assistance: Min  guard;Min assist Stair Management: One rail Right;Step to pattern;Forwards Number of Stairs: 6 General stair comments: cues for technique and safety  Wheelchair Mobility    Modified Rankin (Stroke Patients Only)       Balance                                             Pertinent Vitals/Pain Pain Assessment: 0-10 Pain Score: 4  Pain Location: left foot Pain Intervention(s): Monitored during session;Limited activity within patient's tolerance    Home Living Family/patient expects to be discharged to:: Private residence Living Arrangements: Spouse/significant other;Children;Non-relatives/Friends Available Help at Discharge: Family Type of Home: House Home Access: Stairs to enter   Secretary/administratorntrance Stairs-Number of Steps: 2 Home Layout: Two level Home Equipment: Cane - single point      Prior Function Level of Independence: Independent               Hand Dominance        Extremity/Trunk Assessment   Upper Extremity Assessment Upper Extremity Assessment: Overall WFL for tasks assessed    Lower Extremity Assessment Lower Extremity Assessment: Overall WFL for tasks assessed;LLE deficits/detail LLE Deficits / Details: L toes edematous, ankle WFL       Communication   Communication: No difficulties  Cognition Arousal/Alertness: Awake/alert Behavior During Therapy: WFL for tasks assessed/performed Overall Cognitive Status: Within Functional Limits for tasks assessed  General Comments      Exercises     Assessment/Plan    PT Assessment Patient needs continued PT services;Patent does not need any further PT services  PT Problem List         PT Treatment Interventions      PT Goals (Current goals can be found in the Care Plan section)  Acute Rehab PT Goals Patient Stated Goal: home soon PT Goal Formulation: All assessment and education complete, DC therapy Time For Goal  Achievement: 03/15/19    Frequency     Barriers to discharge        Co-evaluation               AM-PAC PT "6 Clicks" Mobility  Outcome Measure Help needed turning from your back to your side while in a flat bed without using bedrails?: None Help needed moving from lying on your back to sitting on the side of a flat bed without using bedrails?: None Help needed moving to and from a bed to a chair (including a wheelchair)?: None   Help needed to walk in hospital room?: None Help needed climbing 3-5 steps with a railing? : A Little 6 Click Score: 19    End of Session   Activity Tolerance: Patient tolerated treatment well Patient left: in chair;with call bell/phone within reach Nurse Communication: Mobility status PT Visit Diagnosis: Unsteadiness on feet (R26.81);Other abnormalities of gait and mobility (R26.89)    Time: 4174-0814 PT Time Calculation (min) (ACUTE ONLY): 18 min   Charges:   PT Evaluation $PT Eval Low Complexity: 1 Low          Kenyon Ana, PT  Pager: [phone] Acute Rehab Dept [facility]): [phone]   03/08/2019   [facility] 03/08/2019, 12:24 PM

## 2019-03-08 NOTE — Progress Notes (Signed)
Orthopedics Progress Note  Subjective: No complaints this AM  Objective:  Vitals:   03/07/19 2114 03/08/19 0517  BP: (!) 153/65 (!) 161/71  Pulse: 84 (!) 58  Resp: 16 16  Temp: 99.2 F (37.3 C) 98.6 F (37 C)  SpO2: 98% 99%    General: Awake and alert  Musculoskeletal: Left foot dressing intact with no spotting or drainage Leg elevated Neurovascularly intact  Lab Results  Component Value Date   WBC 8.7 03/07/2019   HGB 11.2 (L) 03/07/2019   HCT 37.3 (L) 03/07/2019   MCV 97.9 03/07/2019   PLT 276 03/07/2019       Component Value Date/Time   NA 141 03/07/2019 0436   K 5.0 03/07/2019 0436   CL 104 03/07/2019 0436   CO2 28 03/07/2019 0436   GLUCOSE 151 (H) 03/07/2019 0436   BUN 16 03/07/2019 0436   CREATININE 0.98 03/08/2019 0329   CALCIUM 9.2 03/07/2019 0436   GFRNONAA >60 03/08/2019 0329   GFRAA >60 03/08/2019 0329    Lab Results  Component Value Date   INR 1.1 03/03/2019   INR 1.0 10/28/2008   INR 1.0 10/27/2008    Assessment/Plan: s/p Procedure(s): Ray Amputation left foot I explained to the patient how important it is to maintain foot elevation to allow for appropriate wound healing Continue empiric IV abx per ID and internal medicine Follow up in three weeks with Dr Hervey Ard R. Veverly Fells, MD 03/08/2019 8:42 AM

## 2019-03-08 NOTE — Progress Notes (Signed)
Peripherally Inserted Central Catheter/Midline Placement  The IV Nurse has discussed with the patient and/or persons authorized to consent for the patient, the purpose of this procedure and the potential benefits and risks involved with this procedure.  The benefits include less needle sticks, lab draws from the catheter, and the patient may be discharged home with the catheter. Risks include, but not limited to, infection, bleeding, blood clot (thrombus formation), and puncture of an artery; nerve damage and irregular heartbeat and possibility to perform a PICC exchange if needed/ordered by physician.  Alternatives to this procedure were also discussed.  Bard Power PICC patient education guide, fact sheet on infection prevention and patient information card has been provided to patient /or left at bedside.    PICC/Midline Placement Documentation  PICC Double Lumen 32/12/24 PICC Left Basilic 41 cm 0 cm (Active)  Indication for Insertion or Continuance of Line Home intravenous therapies (PICC only) 03/08/19 1400  Exposed Catheter (cm) 0 cm 03/08/19 1400  Site Assessment Clean;Dry;Intact 03/08/19 1400  Lumen #1 Status Blood return noted;Saline locked;Flushed 03/08/19 1400  Lumen #2 Status Flushed;Blood return noted;Saline locked 03/08/19 1400  Dressing Type Transparent 03/08/19 1400  Dressing Status Clean;Dry;Intact;Antimicrobial disc in place 03/08/19 1400  [place] checked and tightened 03/08/19 1400  Dressing Change Due 03/15/19 03/08/19 1400       Lorenza Cambridge 03/08/2019, 2:46 PM

## 2019-03-09 ENCOUNTER — Encounter (HOSPITAL_COMMUNITY): Payer: Self-pay | Admitting: Orthopedic Surgery

## 2019-03-09 LAB — CREATININE, SERUM
Creatinine, Ser: 0.95 mg/dL (ref 0.61–1.24)
GFR calc Af Amer: >60 mL/min (ref >60)
GFR calc non Af Amer: >60 mL/min (ref >60)

## 2019-03-09 LAB — VANCOMYCIN, RANDOM: Vancomycin Rm: 18

## 2019-03-09 LAB — GLUCOSE, CAPILLARY
Glucose-Capillary: 184 mg/dL — ABNORMAL HIGH (ref 70–99)
Glucose-Capillary: 140 mg/dL — ABNORMAL HIGH (ref 70–99)
Glucose-Capillary: 182 mg/dL — ABNORMAL HIGH (ref 70–99)

## 2019-03-09 MED ORDER — HEPARIN SOD (PORK) LOCK FLUSH 100 UNIT/ML IV SOLN
250.0000 [IU] | INTRAVENOUS | Status: AC | PRN
  Administered 2019-03-09: 250 [IU]

## 2019-03-09 MED ORDER — LINEZOLID 600 MG PO TABS: 600.0000 mg | ORAL_TABLET | Freq: Two times a day (BID) | ORAL | 0 refills | Status: DC

## 2019-03-09 MED ORDER — CEFTRIAXONE IV (FOR PTA / DISCHARGE USE ONLY): 2.0000 g | INTRAVENOUS | 0 refills | Status: AC

## 2019-03-09 MED ORDER — CYCLOBENZAPRINE HCL 5 MG PO TABS: 5.0000 mg | ORAL_TABLET | Freq: Three times a day (TID) | ORAL | 0 refills | Status: AC | PRN

## 2019-03-09 MED ORDER — METOPROLOL SUCCINATE ER 100 MG PO TB24: 100.0000 mg | ORAL_TABLET | Freq: Every day | ORAL | 3 refills | Status: DC

## 2019-03-09 MED ORDER — ACETAMINOPHEN 325 MG PO TABS: 650.0000 mg | ORAL_TABLET | Freq: Four times a day (QID) | ORAL | Status: DC | PRN

## 2019-03-09 MED ORDER — VANCOMYCIN IV (FOR PTA / DISCHARGE USE ONLY): 1250.0000 mg | Freq: Two times a day (BID) | INTRAVENOUS | 0 refills | Status: AC

## 2019-03-09 MED ORDER — OXYCODONE-ACETAMINOPHEN 5-325 MG PO TABS: 1.0000 | ORAL_TABLET | Freq: Three times a day (TID) | ORAL | 0 refills | Status: AC | PRN

## 2019-03-09 NOTE — Progress Notes (Signed)
PHARMACY CONSULT NOTE FOR:  OUTPATIENT  PARENTERAL ANTIBIOTIC THERAPY (OPAT)  Indication: osteomyelitis Regimen: ceftriaxone 2gm IV q24h and vancomycin 1250mg  IV q12h End date: 03/20/2019  Vancomycin trough = 18 mcg/ml on 1500mg  IV q12h.  Adjust discharge order to 1250mg  IV q12h for est trough = 15 mcg/mL  IV antibiotic discharge orders are pended. To discharging provider:  please sign these orders via discharge navigator,  Select New Orders & click on the button choice - Manage This Unsigned Work.     Thank you for allowing pharmacy to be a part of this patient's care.  Doreene Eland, PharmD, BCPS.   Work Cell: [phone] 03/09/2019 4:44 PM

## 2019-03-09 NOTE — Progress Notes (Signed)
Patient stable on discharge with PICC line in place for home health antibiotic infusions. To car with CNA via wheelchair and personal belongings.   [place] Ennis Delpozo RN

## 2019-03-09 NOTE — Discharge Summary (Signed)
. Physician Discharge Summary  Robert Huang:[id number] DOB: August 27, 1961 DOA: 03/02/2019  PCP: Robert Morale, MD  Admit date: 03/02/2019 Discharge date: 03/09/2019  Admitted From: Home Disposition:  Discharged to home with [facility]  Recommendations for Outpatient Follow-up:  1. Follow up with PCP in 1-2 weeks 2. Follow up with ID as scheduled. 3. Follow up with Orthopedics as scheduled.   Discharge Condition: Stable CODE STATUS: FULL  Brief/Interim Summary: Robert Huang a 58 y.o.malewith medical history significant ofhypertension, hyperlipidemia, diabetes mellitus, CAD, stent placement, hypothyroidism, gout, anxiety, who presents with left foot wound with infection.  Patient states that he has a wound in left lateral foot, which has been going on for 6 weeks.Pt was seen by PAphysicianinorthopedic surgeon, Dr. Geannie Huang onWednesday. Patient was started with Keflex.He reports that he has been taking Keflex,but no significant help. He developed fever and chills, with temperature 101.5today. Patient states that he has mildpain in left foot on walking.No chest pain, shortness breath, cough. He has nausea, no vomiting, diarrhea or abdominal pain. No symptoms of UTI or unilateral weakness. Patient states that he was scheduled for MRI of left foot to rule out osteomyelitis by orthopedic surgeon, but has not done yet.  Discharge Diagnoses:  Principal Problem:   Left foot infection Active Problems:   Hypothyroidism   Dyslipidemia   Gout   Anxiety state   Essential hypertension   Coronary atherosclerosis   Diabetes mellitus with complication (HCC)   Diabetic foot ulcers (HCC)   Hypokalemia  Left footdiabetic foot ulcerswithinfection - ptdeveloped fever of101.5 and chills; mild leukocytosis with WBC 10.9. - Pt failedoutpatient antibiotic of Keflex treatment. - IV abx with vanc/ceftriaxone/flagyl; abx held for surgery/Cx data - MRI  with evidence of osteomyelitis throughout 5th metatarsal with skin ulceration and surrounding cellulitis noted as well - ABI's wnl (preliminary study) - PRN Zofran for nausea,Norcofor pain - Bld Cx NTD - ESR (50) and CRP (2.9) - wound care consult, appreciate recs - ortho consulted, appreciate assistance - ID onboard, appreciate assistance - he has failed outpt abx; we don't have an culture data at this point to guide our treatment; discussed with ID/ortho, we need a Bx and Cx of the bone; IR is unable to do this, so he is now scheduled for OR on 03/06/19 - s/p ray amputation     - no sample sent for Cx; ID to continue vanc/rocephin; needs vanc trough, will get 03/09/2019     - PICC ordered for 2 weeks IV abx; vanc 1231m BID, rocephin 2g daily (03/20/19)     - Per ID: Discharge antibiotics:         Per pharmacy protocol: vancomycin and rocephin 2 gm IV daily         Aim for Vancomycin trough 15-20 (unless otherwise indicated)         Duration:         2 weeks from time of ray resction         End Date:         03/20/2019         [facility]        _X_ Please leave PIC in place until doctor has seen patient or been notified        Labs weekly while on IV antibiotics:        _X_ CBC with differential        _X_ BMP         _X_  CRP        _X_ Vancomycin trough       Fax weekly labs to [phone]       Clinic Follow Up Appt:        03/16/2019 at 10:30 AM        @ [facility] with Dr. Prince Huang  Hypothyroidism - Synthroid  Dyslipidemia: - Crestor,Zetia, fenofibrate  Gout: - prn Indocin  Anxiety state - was on PRN xanax; denies complaints  Essential hypertension - Continue home amlodipine, clonidine, Prinzide, metoprolol - becoming significantly bradycardic; hold parameters placed on metoprolol and dosing decreased  Coronary atherosclerosis - s/p of stent. No CP. -  continueCrestor, Zetia, fenofibrate, metoprolol, PRN nitroglycerin  Diabetes mellitus with complication, withdiabetic foot ulcer - Last A1c8.0 on 10/03/18, poorly controled. Patient is takingglipizide and metforminat home - SSI     - resume home meds at discharge  Hypokalemia/Hypomagnesemia/Hypophosphatemia -replete and monitor  Discharge Instructions   Allergies as of 03/09/2019   No Known Allergies     Medication List    STOP taking these medications   ALPRAZolam 1 MG tablet Commonly known as: XANAX   Aspirin-Acetaminophen-Caffeine 500-325-65 MG Pack Commonly known as: Goodys Extra Strength   cephALEXin 500 MG capsule Commonly known as: KEFLEX   HYDROcodone-acetaminophen 5-325 MG tablet Commonly known as: Norco     TAKE these medications   acetaminophen 325 MG tablet Commonly known as: TYLENOL Take 2 tablets (650 mg total) by mouth every 6 (six) hours as needed for mild pain (or Fever >/= 101).   amLODipine 10 MG tablet Commonly known as: NORVASC Take 1 tablet (10 mg total) by mouth daily. <PLEASE MAKE APPOINTMENT FOR REFILLS>   cefTRIAXone  IVPB Commonly known as: ROCEPHIN Inject 2 g into the vein daily for 11 days. Indication:  osteomyelitis Last Day of Therapy:  03/20/2019 Labs - Once weekly:  CBC/D and BMP, Labs - Every other week:  ESR and CRP   cloNIDine 0.3 MG tablet Commonly known as: CATAPRES TAKE 1 TABLET (0.3 MG TOTAL) BY MOUTH 2 (TWO) TIMES DAILY. NEED OV.   colchicine 0.6 MG tablet Take 1 tablet (0.6 mg total) by mouth every 6 (six) hours as needed (gout).   cyclobenzaprine 5 MG tablet Commonly known as: FLEXERIL Take 1 tablet (5 mg total) by mouth 3 (three) times daily as needed for up to 5 days for muscle spasms.   ezetimibe 10 MG tablet Commonly known as: ZETIA Take 1 tablet (10 mg total) by mouth daily.   fenofibrate 160 MG tablet Take 1 tablet (160 mg total) by mouth daily. NEED OV. What changed: when to take this    glipiZIDE 10 MG tablet Commonly known as: GLUCOTROL TAKE ONE TABLET BY MOUTH TWICE A DAY BEFORE A MEAL * NEED OFFICE VISIT FOR FUTURE REFILLS * What changed:   how much to take  how to take this  when to take this  reasons to take this  additional instructions   glucose blood test strip Commonly known as: ONE TOUCH ULTRA TEST 1 each by Other route daily. Use as instructed   indomethacin 75 MG CR capsule Commonly known as: INDOCIN SR Take 1 capsule (75 mg total) by mouth 2 (two) times daily with a meal. What changed:   when to take this  reasons to take this   levothyroxine 112 MCG tablet Commonly known as: SYNTHROID TAKE 1 TABLET BY MOUTH EVERY DAY   lisinopril-hydrochlorothiazide 20-12.5 MG tablet Commonly known as: ZESTORETIC Take 1 tablet  by mouth 2 (two) times daily. 10AM AND 5PM   metFORMIN 1000 MG tablet Commonly known as: GLUCOPHAGE TAKE 1 TABLET BY MOUTH TWO TIMES A DAY WITH A MEAL What changed:   how much to take  how to take this  when to take this  additional instructions   metoprolol succinate 100 MG 24 hr tablet Commonly known as: TOPROL-XL Take 1 tablet (100 mg total) by mouth daily. What changed: when to take this   nitroGLYCERIN 0.4 MG SL tablet Commonly known as: NITROSTAT Place 1 tablet (0.4 mg total) under the tongue every 5 (five) minutes as needed. For chest pain. What changed:   reasons to take this  additional instructions   oxyCODONE-acetaminophen 5-325 MG tablet Commonly known as: PERCOCET/ROXICET Take 1 tablet by mouth every 8 (eight) hours as needed for up to 5 days for moderate pain or severe pain.   rosuvastatin 40 MG tablet Commonly known as: CRESTOR Take 1 tablet (40 mg total) by mouth daily. <PLEASE MAKE APPOINTMENT FOR REFILLS> What changed: when to take this   sitaGLIPtin 100 MG tablet Commonly known as: Januvia Take 1 tablet (100 mg total) by mouth daily.   vancomycin  IVPB Inject 1,250 mg into the vein  every 12 (twelve) hours for 10 days. Indication:  osteomyelitis Last Day of Therapy:  03/20/2019 Labs - _0 /13/20 [place]  (From admission, onward)         Start     Ordered   03/08/19 1252  For home use only DME Walker rolling  Once    Question:  Patient needs a walker to treat with the following condition  Answer:  History of complete ray amputation of fifth toe of left foot [facility])   03/08/19 1253         Follow-up Information    Nicholes Stairs, MD In 3 weeks.   Specialty: Orthopedic Surgery Why: For suture removal, For  wound re-check Contact information: [address] [place] [address] [phone]          No Known Allergies  Consultations:  ID  Orthopedics   Procedures/Studies: Dg Chest 2 View  Result Date: 03/02/2019 CLINICAL DATA:  Fever. Wound infection of left foot. EXAM: CHEST - 2 VIEW COMPARISON:  07/09/2017 FINDINGS: The cardiomediastinal contours are normal. The lungs are clear. Pulmonary vasculature is normal. No consolidation, pleural effusion, or pneumothorax. No acute osseous abnormalities are seen. IMPRESSION: No acute chest findings. Electronically Signed   By: Keith Rake M.D.   On: 03/02/2019 22:31   Mr Foot Left W Wo Contrast  Result Date: 03/03/2019 CLINICAL DATA:  Diabetic patient with a skin wound adjacent to the fifth metatarsal. EXAM: MRI OF THE LEFT FOREFOOT WITHOUT AND WITH CONTRAST TECHNIQUE: Multiplanar, multisequence MR imaging of the left forefoot was performed both before and after administration of intravenous contrast. CONTRAST:  10 cc Gadavist IV. COMPARISON:  Plain films left foot 03/02/2019 FINDINGS: Bones/Joint/Cartilage Healed fracture of the distal diaphysis of the fifth metatarsal is seen as on the prior plain films. There is marrow edema and enhancement throughout the fifth metatarsal consistent with osteomyelitis. There is no other marrow abnormality to suggest osteomyelitis. Loss of fat saturation in all the toes on T2 weighted imaging is incidentally noted. Ligaments Intact. Muscles and Tendons Atrophy of intrinsic musculature the foot is identified. No intramuscular fluid collection is seen. Soft tissues Skin wound along the lateral margin of the distal diaphysis of the fifth metatarsal appears to extend to bone. No underlying abscess. Soft tissue edema and enhancement about the fifth metatarsal are identified. IMPRESSION: Edema and enhancement  throughout the fifth metatarsal consistent with osteomyelitis. Skin ulceration and surrounding cellulitis along the distal diaphysis of the fifth metatarsal noted. Healed fracture distal fifth metatarsal as seen on prior plain films. Electronically Signed   By: Inge Rise M.D.   On: 03/03/2019 12:01   Dg Foot Complete Left  Result Date: 03/02/2019 CLINICAL DATA:  Lateral affection EXAM: LEFT FOOT - COMPLETE 3+ VIEW COMPARISON:  None. FINDINGS: There is soft tissue swelling about the fifth digit. There is an old fracture deformity of the fifth digit. There is in overlying ulcer. There is an age-indeterminate deformity of the proximal phalanx of the fifth digit. There is soft tissue swelling about the forefoot. There is a large plantar calcaneal spur. There are advanced degenerative changes of the first metatarsophalangeal joint. IMPRESSION: 1. No definite acute displaced fracture or dislocation. 2. Soft tissue ulcer at the level of the fifth metatarsal. 3. Old healed fifth metatarsal fracture. 4. Age-indeterminate fracture of the proximal phalanx of the fifth digit. 5. Advanced degenerative changes of the first metatarsophalangeal joint. Electronically Signed   By: Constance Holster M.D.   On: 03/02/2019 22:03   Vas [place] Abi With/wo Tbi  Result Date: 03/03/2019 LOWER EXTREMITY DOPPLER STUDY Indications: Ulceration. High Risk         Hypertension, hyperlipidemia, Diabetes, coronary artery Factors:          disease. Other Factors: History of right great toe amputation.  Comparison Study: No prior study. Performing Technologist: Maudry Mayhew MHA, RVT, RDCS, RDMS  Examination Guidelines: A complete evaluation includes at minimum, Doppler waveform signals and systolic blood pressure reading at the level of bilateral brachial, anterior tibial, and posterior tibial arteries, when vessel segments are accessible. Bilateral testing is considered an integral part of a complete examination. Photoelectric  Plethysmograph (PPG) waveforms and toe systolic  pressure readings are included as required and additional duplex testing as needed. Limited examinations for reoccurring indications may be performed as noted.  ABI Findings: +---------+------------------+-----+---------+----------+ Right    Rt Pressure (mmHg)IndexWaveform Comment    +---------+------------------+-----+---------+----------+ Brachial 156                    triphasic           +---------+------------------+-----+---------+----------+ PTA      142               0.91 triphasic           +---------+------------------+-----+---------+----------+ DP       150               0.96 triphasic           +---------+------------------+-----+---------+----------+ Great Toe                                Amputation +---------+------------------+-----+---------+----------+ +---------+------------------+-----+---------+-------+ Left     Lt Pressure (mmHg)IndexWaveform Comment +---------+------------------+-----+---------+-------+ Brachial 155                    triphasic        +---------+------------------+-----+---------+-------+ PTA      185               1.19 triphasic        +---------+------------------+-----+---------+-------+ DP       181               1.16 triphasic        +---------+------------------+-----+---------+-------+ Great Toe128               0.82                  +---------+------------------+-----+---------+-------+ +-------+-----------+-----------+------------+------------+ ABI/TBIToday's ABIToday's TBIPrevious ABIPrevious TBI +-------+-----------+-----------+------------+------------+ Right  0.96       -                                   +-------+-----------+-----------+------------+------------+ Left   1.19       0.82                                +-------+-----------+-----------+------------+------------+  Summary: Right: Resting right ankle-brachial index is within  normal range. No evidence of significant right lower extremity arterial disease. Left: Resting left ankle-brachial index is within normal range. No evidence of significant left lower extremity arterial disease. The left toe-brachial index is normal.  *See table(s) above for measurements and observations.  Electronically signed by Deitra Mayo MD on 03/03/2019 at 5:04:18 PM.    Final    [place] Ekg Site Rite  Result Date: 03/08/2019 If Site Rite image not attached, placement could not be confirmed due to current cardiac rhythm.     Subjective: "I am ready to go."  Discharge Exam: Vitals:   03/08/19 2109 03/09/19 0540  BP: (!) 166/75 (!) 165/86  Pulse: 68 70  Resp: 16 18  Temp: 98.9 F (37.2 C) 99.3 F (37.4 C)  SpO2: 100% 97%   Vitals:   03/08/19 0517 03/08/19 1812 03/08/19 2109 03/09/19 0540  BP: (!) 161/71 (!) 143/78 (!) 166/75 (!) 165/86  Pulse: (!) 58 63 68 70  Resp: _0 Temp: 98.6 F (37 C) 99.1 F (37.3 C) 98.9  F (37.2 C) 99.3 F (37.4 C)  TempSrc: Oral Oral Oral   SpO2: 99% 100% 100% 97%  Weight:      Height:        General:58 y.o.maleresting in chair in NAD Cardiovascular: RRR, +S1, S2, no m/g/r, equal pulses throughout Respiratory: CTABL, no w/r/r, normal WOB GI: BS+, NDNT, no masses noted, no organomegaly noted MSK: No e/c/c; LLE bandaging C/D/I Neuro: A&O x 3, no focal deficits    The results of significant diagnostics from this hospitalization (including imaging, microbiology, ancillary and laboratory) are listed below for reference.     Microbiology: Recent Results (from the past 240 hour(s))  Culture, blood (routine x 2)     Status: None   Collection Time: 03/02/19 10:01 PM   Specimen: BLOOD LEFT FOREARM  Result Value Ref Range Status   Specimen Description   Final    BLOOD LEFT FOREARM Performed at [facility], [address] [place], [place] [address]    Special Requests   Final    BOTTLES DRAWN AEROBIC AND  ANAEROBIC Blood Culture adequate volume Performed at [facility], [address] [place], [place] [address]    Culture   Final    NO GROWTH 5 DAYS Performed at [facility], [place] [address] [place], [place] [address]    Report Status 03/08/2019 FINAL  Final  SARS Coronavirus 2 (Hosp order,Performed in [facility] lab via Abbott ID)     Status: None   Collection Time: 03/02/19 10:01 PM   Specimen: Dry Nasal Swab (Abbott ID Now)  Result Value Ref Range Status   SARS Coronavirus 2 (Abbott ID Now) NEGATIVE NEGATIVE Final    Comment: (NOTE) SARS-CoV-2 target nucleic acids are NOT DETECTED. The SARS-CoV-2 RNA is generally detectable in upper and lower respiratory specimens during the acute phase of infection.  Negativeresults do not preclude SARS-CoV-2 infection, do not rule out coinfections with other pathogens, and should not be used as the  sole basis for treatment or other patient management decisions.  Negative results must be combined with clinical observations, patient history, and epidemiological information. The expected result is Negative. Fact Sheet for Patients: GolfingFamily.no Fact Sheet for Healthcare Providers: https://www.hernandez-brewer.com/ This test is not yet approved or cleared by the [place] FDA and  has been authorized for detection and/or diagnosis of SARS-CoV-2 by FDA under an Emergency Use Authorization (EUA).  This EUA will remain in effect (meaning this test can be used) for the duration of  the COVID19 declaration under Section 5 64(b)(1) of the Act, 21 U.S.C.  section [phone] 3(b)(1), unless the authorization is terminated or revoked sooner. Performed at [facility], [address] [place], [place] [address]   Culture, blood (routine x 2)     Status: None   Collection Time: 03/02/19 10:04 PM   Specimen: BLOOD LEFT HAND  Result Value Ref Range Status   Specimen Description   Final     BLOOD LEFT HAND Performed at [facility], [address] [place], [place] [address]    Special Requests   Final    BOTTLES DRAWN AEROBIC AND ANAEROBIC Blood Culture results may not be optimal due to an inadequate volume of blood received in culture bottles Performed at [facility], [address] [place], [place] [address]    Culture   Final    NO GROWTH 5 DAYS Performed at [facility],  [address] [place], [place] [address]    Report Status 03/08/2019 FINAL  Final  Surgical PCR screen     Status: Abnormal   Collection Time: 03/03/19  2:02 AM   Specimen: Nasal Mucosa; Nasal Swab  Result Value Ref Range Status   MRSA, PCR POSITIVE (A) NEGATIVE Final    Comment: CRITICAL RESULT CALLED TO, READ BACK BY AND VERIFIED WITH: RN KNAPP AT 0413 03/03/19 [place] A    Staphylococcus aureus POSITIVE (A) NEGATIVE Final    Comment: (NOTE) The Xpert SA Assay (FDA approved for NASAL specimens in patients 40 years of age and older), is one component of a comprehensive surveillance program. It is not intended to diagnose infection nor to guide or monitor treatment. Performed at [facility], [place] [address], [place], [place] [address]      Labs: BNP (last 3 results) No results for input(s): BNP in the last 8760 hours. Basic Metabolic Panel: Recent Labs  Lab 03/03/19 0311 03/04/19 0338 03/05/19 0245 03/06/19 0223 03/07/19 0436 03/08/19 0329 03/09/19 0324  NA 140 142 139 138 141  --   --   K 3.7 3.4* 3.3* 3.5 5.0  --   --   CL 108 109 102 103 104  --   --   CO2 _0 --   --   GLUCOSE 131* 172* 216* 169* 151*  --   --   BUN _1 --   --   CREATININE 0.87 0.90 1.01 0.82 0.92 0.98 0.95  CALCIUM 8.9 8.6* 8.7* 8.6* 9.2  --   --   MG 1.4* 1.6* 1.5* 1.6* 1.9  --   --   PHOS  --   --  2.3* 2.8 3.4  --   --    Liver Function Tests: Recent Labs  Lab 03/02/19 2201 03/04/19 0338 03/05/19 0245 03/06/19 0223  03/07/19 0436  AST 14* 16  --   --   --   ALT 15 15  --   --   --   ALKPHOS 69 53  --   --   --   BILITOT 0.5 0.5  --   --   --   PROT 6.9 6.8  --   --   --   ALBUMIN 3.3* 3.0* 3.2* 3.0* 3.5   No results for input(s): LIPASE, AMYLASE in the last 168 hours. No results for input(s): AMMONIA in the last 168 hours. CBC: Recent Labs  Lab 03/02/19 2201 03/03/19 0311 03/04/19 0338 03/05/19 0245 03/06/19 0223 03/07/19 0436  WBC 10.9* 8.3 5.3 6.2 7.4 8.7  NEUTROABS 9.4*  --   --  4.2 5.2 6.6  HGB 11.6* 10.6* 10.4* 11.2* 10.7* 11.2*  HCT 35.6* 34.1* 34.4* 37.6* 34.5* 37.3*  MCV 93.2 95.3 97.7 97.4 98.0 97.9  PLT 283 273 258 282 286 276   Cardiac Enzymes: No results for input(s): CKTOTAL, CKMB, CKMBINDEX, TROPONINI in the last 168 hours. BNP: Invalid input(s): POCBNP CBG: Recent Labs  Lab 03/08/19 0716 03/08/19 1145 03/08/19 1619 03/08/19 2107 03/09/19 0725  GLUCAP 143* 244* 151* 155* 184*   D-Dimer No results for input(s): DDIMER in the last 72 hours. Hgb A1c No results for input(s): HGBA1C in the last 72 hours. Lipid Profile No results for input(s): CHOL, HDL, LDLCALC, TRIG, CHOLHDL, LDLDIRECT in the last 72 hours. Thyroid function studies No results for input(s): TSH, T4TOTAL, T3FREE, THYROIDAB in the last 72 hours.  Invalid input(s):  FREET3 Anemia work up No results for input(s): VITAMINB12, FOLATE, FERRITIN, TIBC, IRON, RETICCTPCT in the last 72 hours. Urinalysis    Component Value Date/Time   COLORURINE YELLOW 03/02/2019 2204   APPEARANCEUR CLEAR 03/02/2019 2204   LABSPEC 1.025 03/02/2019 2204   PHURINE 5.5 03/02/2019 [place] 03/02/2019 [place] 03/02/2019 2204   HGBUR negative 04/16/2007 [place] 03/02/2019 2204   BILIRUBINUR neg 10/03/2018 1313   KETONESUR NEGATIVE 03/02/2019 2204   PROTEINUR NEGATIVE 03/02/2019 2204   UROBILINOGEN 0.2 10/03/2018 1313   UROBILINOGEN negative 04/16/2007 0904   NITRITE  NEGATIVE 03/02/2019 [place] 03/02/2019 2204   Sepsis Labs Invalid input(s): PROCALCITONIN,  WBC,  LACTICIDVEN Microbiology Recent Results (from the past 240 hour(s))  Culture, blood (routine x 2)     Status: None   Collection Time: 03/02/19 10:01 PM   Specimen: BLOOD LEFT FOREARM  Result Value Ref Range Status   Specimen Description   Final    BLOOD LEFT FOREARM Performed at [facility], [address] [place], [place] [address]    Special Requests   Final    BOTTLES DRAWN AEROBIC AND ANAEROBIC Blood Culture adequate volume Performed at [facility], [address] [place], [place] [address]    Culture   Final    NO GROWTH 5 DAYS Performed at [facility], [place] [address] [place], [place] [address]    Report Status 03/08/2019 FINAL  Final  SARS Coronavirus 2 (Hosp order,Performed in [facility] lab via Abbott ID)     Status: None   Collection Time: 03/02/19 10:01 PM   Specimen: Dry Nasal Swab (Abbott ID Now)  Result Value Ref Range Status   SARS Coronavirus 2 (Abbott ID Now) NEGATIVE NEGATIVE Final    Comment: (NOTE) SARS-CoV-2 target nucleic acids are NOT DETECTED. The SARS-CoV-2 RNA is generally detectable in upper and lower respiratory specimens during the acute phase of infection.  Negativeresults do not preclude SARS-CoV-2 infection, do not rule out coinfections with other pathogens, and should not be used as the  sole basis for treatment or other patient management decisions.  Negative results must be combined with clinical observations, patient history, and epidemiological information. The expected result is Negative. Fact Sheet for Patients: GolfingFamily.no Fact Sheet for Healthcare Providers: https://www.hernandez-brewer.com/ This test is not yet approved or cleared by the [place] FDA and  has been authorized for detection and/or diagnosis of SARS-CoV-2 by FDA under an  Emergency Use Authorization (EUA).  This EUA will remain in effect (meaning this test can be used) for the duration of  the COVID19 declaration under Section 5 64(b)(1) of the Act, 21 U.S.C.  section [phone] 3(b)(1), unless the authorization is terminated or revoked sooner. Performed at [facility], [address] [place], [place] [address]   Culture, blood (routine x 2)     Status: None   Collection Time: 03/02/19 10:04 PM   Specimen: BLOOD LEFT HAND  Result Value Ref Range Status   Specimen Description   Final    BLOOD LEFT HAND Performed at [facility], [address] [place], [place] [address]    Special Requests   Final    BOTTLES DRAWN AEROBIC AND ANAEROBIC Blood Culture results may not be optimal due to an inadequate volume of blood received in culture bottles Performed at [facility], [address]  [place], [place] [id number]    Culture   Final    NO GROWTH 5 DAYS Performed at [facility], [place] [address], [place], [place] [address]    Report Status 03/08/2019 FINAL  Final  Surgical PCR screen     Status: Abnormal   Collection Time: 03/03/19  2:02 AM   Specimen: Nasal Mucosa; Nasal Swab  Result Value Ref Range Status   MRSA, PCR POSITIVE (A) NEGATIVE Final    Comment: CRITICAL RESULT CALLED TO, READ BACK BY AND VERIFIED WITH: RN KNAPP AT 0413 03/03/19 CRUICKSHANK A    Staphylococcus aureus POSITIVE (A) NEGATIVE Final    Comment: (NOTE) The Xpert SA Assay (FDA approved for NASAL specimens in patients 58 years of age and older), is one component of a comprehensive surveillance program. It is not intended to diagnose infection nor to guide or monitor treatment. Performed at [facility], [place] [address], [place], [place] [address]      Time coordinating discharge: 35 minutes  SIGNED:   Jonnie Finner, DO  [facility] 03/09/2019, 8:26 AM Pager   If 7PM-7AM, please contact  night-coverage [email or web address] Password [email or web address]

## 2019-03-09 NOTE — TOC Transition Note (Signed)
Transition of Care [facility]) - CM/SW Discharge Note   Patient Details  Name: DENYM RAHIMI MRN: [id number] Date of Birth: 08-11-1961  Transition of Care [facility]) CM/SW Contact:  Lia Hopping, [place] Phone Number: 03/09/2019, 10:31 AM   Clinical Narrative:    Telephone call to: [employer] Rep. Pam confirm IV antibiotics, supplies and initial instruction for home.   [facility]-Rep Santiago Glad confirmed able to accept for [facility] RN for instruction IV antibiotics  DME Rep-Zack Blank to deliver to room prior to discharge  No further TOC needs.    Final next level of care: [place] Barriers to Discharge: No Barriers Identified   Patient Goals and CMS Choice Patient states their goals for this hospitalization and ongoing recovery are:: Home to recover CMS Medicare.gov Compare Post Acute Care list provided to:: Patient Choice offered to / list presented to : Patient  Discharge Placement  Home                     Discharge Plan and Services                DME Arranged: [place] rolling DME Agency: [employer] Date DME Agency Contacted: 03/09/19 Time DME Agency Contacted: 46 Representative spoke with at DME Agency: [place]: RN, IV Antibiotics HH Agency: Tour manager, Gregory ([place]) Date [place]: 03/09/19 Time [place]: 8 Representative spoke with at [place]: [facility] Pam-[employer]  Social Determinants of Health ([place]) Interventions     Readmission Risk Interventions No flowsheet data found.

## 2019-03-09 NOTE — Progress Notes (Signed)
PHARMACY CONSULT NOTE FOR:  OUTPATIENT  PARENTERAL ANTIBIOTIC THERAPY (OPAT)  Indication: osteomyelitis Regimen: ceftriaxone 2gm IV q24h and vancomycin 1500mg  IV q12h  IF pt discharged today before random vanc level obtained recommend obtaining vanc level tomorrow along with Scr  End date: 03/20/2019  IV antibiotic discharge orders are pended. To discharging provider:  please sign these orders via discharge navigator,  Select New Orders & click on the button choice - Manage This Unsigned Work.     Thank you for allowing pharmacy to be a part of this patient's care.  Dolly Rias RPh 03/09/2019, 9:41 AM

## 2019-03-09 NOTE — Plan of Care (Signed)

## 2019-03-09 NOTE — Progress Notes (Signed)
Picc line was capped and flushed for discharge home.

## 2019-03-10 ENCOUNTER — Other Ambulatory Visit: Payer: Self-pay | Admitting: Family Medicine

## 2019-03-11 ENCOUNTER — Encounter: Payer: Self-pay | Admitting: Family Medicine

## 2019-03-16 ENCOUNTER — Ambulatory Visit (INDEPENDENT_AMBULATORY_CARE_PROVIDER_SITE_OTHER): Payer: 59 | Admitting: Infectious Diseases

## 2019-03-16 ENCOUNTER — Encounter: Payer: Self-pay | Admitting: Infectious Diseases

## 2019-03-16 ENCOUNTER — Other Ambulatory Visit: Payer: Self-pay

## 2019-03-16 VITALS — BP 150/70 | HR 79 | Temp 98.0°F | Ht 72.0 in | Wt 202.0 lb

## 2019-03-16 DIAGNOSIS — E11621 Type 2 diabetes mellitus with foot ulcer: Secondary | ICD-10-CM | POA: Diagnosis not present

## 2019-03-16 DIAGNOSIS — R4689 Other symptoms and signs involving appearance and behavior: Secondary | ICD-10-CM | POA: Diagnosis not present

## 2019-03-16 DIAGNOSIS — L97509 Non-pressure chronic ulcer of other part of unspecified foot with unspecified severity: Secondary | ICD-10-CM

## 2019-03-16 DIAGNOSIS — M86472 Chronic osteomyelitis with draining sinus, left ankle and foot: Secondary | ICD-10-CM | POA: Diagnosis not present

## 2019-03-16 NOTE — Telephone Encounter (Signed)
Please advise. I do not see this medication on the patients current med list. 

## 2019-03-16 NOTE — Progress Notes (Signed)
Subjective:    Patient ID: Robert Huang, male    DOB: 11-02-1960, 58 y.o.   MRN: [id number]  HPI The patient is a 58 year old white male smoker with significant vascular disease status post stenting and prior right first ray amputation, poorly controlled DM, gout, CAD, and anxiety d/o who is now presenting for hospital follow-up visit for left fifth metatarsal osteomyelitis.  He was last seen in our clinic on March 02, 2019 at which time he was being admitted to the hospital for surgical management along with parenteral antibiotics.   No wound cultures were obtained from the patient's left foot either in the orthopedic clinic or surprisingly, in the hospital when he had an amputation to his left fifth ray.  This unfortunately left him requiring both IV vancomycin and Rocephin but as clean margins were felt to have been achieved treatment can be limited to 2 weeks postoperatively ending on March 20, 2019.  Laboratories taken earlier this morning show a white blood cell count of 6400, hemoglobin of 10.8, platelet count of 394,000, creatinine of 0.82, vancomycin trough of 12.9, CRP of 44.   Past Medical History:  Diagnosis Date  . Anxiety   . CAD (coronary artery disease)    sees Dr. Stanford Breed, normal Stress test 07-16-12   . Diabetes mellitus   . DJD (degenerative joint disease)   . Gout   . Hyperlipidemia   . Hypertension   . Hypothyroid   . Osteoarthritis   . Renal calculus    hx  . Umbilical hernia     Past Surgical History:  Procedure Laterality Date  . AMPUTATION  07/17/2012   Procedure: AMPUTATION RAY;  Surgeon: Wylene Simmer, MD;  Location: [place];  Service: Orthopedics;  Laterality: Right;  right hallux amputation (1st ray resection )  . AMPUTATION Left 03/06/2019   Procedure: Ray Amputation left foot;  Surgeon: Nicholes Stairs, MD;  Location: [facility];  Service: Orthopedics;  Laterality: Left;  . CORONARY ANGIOPLASTY WITH STENT PLACEMENT    . FINGER SURGERY     left index  finger  . I & D Toe  05/19/2012   rt great toe  . I&D EXTREMITY  05/19/2012   Procedure: IRRIGATION AND DEBRIDEMENT EXTREMITY;  Surgeon: Marin Shutter, MD;  Location: [place];  Service: Orthopedics;  Laterality: Right;  Right Great toe  . TONSILLECTOMY       Family History  Problem Relation Age of Onset  . Heart attack Mother   . Arthritis Other   . Coronary artery disease Other   . Diabetes Other   . Hypertension Other   . Prostate cancer Other   . Stroke Other      Social History   Tobacco Use  . Smoking status: Never Smoker  . Smokeless tobacco: Current User    Types: Chew  . Tobacco comment: occ  Substance Use Topics  . Alcohol use: Yes    Alcohol/week: 0.0 standard drinks    Comment: weekends  . Drug use: No      reports being sexually active.   Outpatient Medications Prior to Visit  Medication Sig Dispense Refill  . acetaminophen (TYLENOL) 325 MG tablet Take 2 tablets (650 mg total) by mouth every 6 (six) hours as needed for mild pain (or Fever >/= 101).    [name] amLODipine (NORVASC) 10 MG tablet Take 1 tablet (10 mg total) by mouth daily. <PLEASE MAKE APPOINTMENT FOR REFILLS> 90 tablet 3  . cefTRIAXone (ROCEPHIN) IVPB Inject 2  g into the vein daily for 11 days. Indication:  osteomyelitis Last Day of Therapy:  03/20/2019 Labs - Once weekly:  CBC/D and BMP, Labs - Every other week:  ESR and CRP 11 Units 0  . cloNIDine (CATAPRES) 0.3 MG tablet TAKE 1 TABLET (0.3 MG TOTAL) BY MOUTH 2 (TWO) TIMES DAILY. NEED OV. 60 tablet 11  . colchicine 0.6 MG tablet Take 1 tablet (0.6 mg total) by mouth every 6 (six) hours as needed (gout). 60 tablet 5  . fenofibrate 160 MG tablet Take 1 tablet (160 mg total) by mouth daily. NEED OV. (Patient taking differently: Take 160 mg by mouth every evening. NEED OV.) 90 tablet 3  . glipiZIDE (GLUCOTROL) 10 MG tablet TAKE ONE TABLET BY MOUTH TWICE A DAY BEFORE A MEAL * NEED OFFICE VISIT FOR FUTURE REFILLS * (Patient taking differently: Take 5 mg by  mouth daily as needed (BS >200). ) 180 tablet 3  . glucose blood (ONE TOUCH ULTRA TEST) test strip 1 each by Other route daily. Use as instructed 100 each 3  . indomethacin (INDOCIN SR) 75 MG CR capsule Take 1 capsule (75 mg total) by mouth 2 (two) times daily with a meal. (Patient taking differently: Take 75 mg by mouth 2 (two) times daily as needed for mild pain. ) 180 capsule 3  . levothyroxine (SYNTHROID, LEVOTHROID) 112 MCG tablet TAKE 1 TABLET BY MOUTH EVERY DAY (Patient taking differently: Take 112 mcg by mouth daily. ) 30 tablet 11  . lisinopril-hydrochlorothiazide (PRINZIDE,ZESTORETIC) 20-12.5 MG tablet Take 1 tablet by mouth 2 (two) times daily. 10AM AND 5PM 180 tablet 3  . metFORMIN (GLUCOPHAGE) 1000 MG tablet TAKE 1 TABLET BY MOUTH TWO TIMES A DAY WITH A MEAL (Patient taking differently: Take 1,000 mg by mouth 2 (two) times daily with a meal. ) 180 tablet 3  . metoprolol succinate (TOPROL-XL) 100 MG 24 hr tablet Take 1 tablet (100 mg total) by mouth daily. 180 tablet 3  . nitroGLYCERIN (NITROSTAT) 0.4 MG SL tablet Place 1 tablet (0.4 mg total) under the tongue every 5 (five) minutes as needed. For chest pain. (Patient taking differently: Place 0.4 mg under the tongue every 5 (five) minutes as needed for chest pain. ) 25 tablet 5  . rosuvastatin (CRESTOR) 40 MG tablet Take 1 tablet (40 mg total) by mouth daily. <PLEASE MAKE APPOINTMENT FOR REFILLS> (Patient taking differently: Take 40 mg by mouth every evening. <PLEASE MAKE APPOINTMENT FOR REFILLS>) 90 tablet 3  . sitaGLIPtin (JANUVIA) 100 MG tablet Take 1 tablet (100 mg total) by mouth daily. 90 tablet 3  . vancomycin IVPB Inject 1,250 mg into the vein every 12 (twelve) hours for 10 days. Indication:  osteomyelitis Last Day of Therapy:  03/20/2019 Labs - Sunday/Monday:  CBC/D, BMP, and vancomycin trough. Labs - Thursday:  BMP and vancomycin trough Labs - Every other week:  ESR and CRP 20 Units 0  . zolpidem (AMBIEN) 10 MG tablet TAKE 1  TABLET BY MOUTH EVERYDAY AT BEDTIME (Patient taking differently: Take 10 mg by mouth at bedtime as needed for sleep. ) 90 tablet 1  . ezetimibe (ZETIA) 10 MG tablet Take 1 tablet (10 mg total) by mouth daily. 90 tablet 3   No facility-administered medications prior to visit.      No Known Allergies    Review of Systems  Constitutional: Positive for fatigue. Negative for chills and fever.  HENT: Negative for congestion, hearing loss, rhinorrhea and sinus pressure.   Eyes: Negative for  photophobia, pain, redness and visual disturbance.  Respiratory: Negative for apnea, cough, shortness of breath and wheezing.   Cardiovascular: Negative for chest pain and palpitations.  Gastrointestinal: Negative for abdominal pain, constipation, diarrhea, nausea and vomiting.  Endocrine: Negative for cold intolerance, heat intolerance, polydipsia and polyuria.  Genitourinary: Negative for decreased urine volume, dysuria, frequency, hematuria and testicular pain.  Musculoskeletal: Positive for arthralgias. Negative for back pain, myalgias and neck pain.  Skin: Positive for wound. Negative for pallor and rash.       LT foot  Allergic/Immunologic: Negative for immunocompromised state.  Neurological: Negative for dizziness, seizures, syncope, speech difficulty and light-headedness.  Hematological: Does not bruise/bleed easily.  Psychiatric/Behavioral: Negative for agitation and hallucinations. The patient is not nervous/anxious.        Objective:    Vitals:   03/16/19 1035  BP: (!) 150/70  Pulse: 79  Temp: 98 F (36.7 C)   Physical Exam Gen: chronically ill-appearing, appears older than stated age, moderate distress secondary to LT foot wound/pain, reeks of cigarette smoke, A&Ox 3 Head: NCAT, no temporal wasting evident EENT: PERRL, EOMI, MMM, adequate dentition Neck: supple, no JVD CV: NRRR, no murmurs evident Pulm: CTA bilaterally, no wheeze or retractions Abd: soft, NTND, +BS Extrems: 1+  pitting LT LE edema, 1+ pulses MSK: s/p RT 1st ray resection, LT foot wound bandaged w/o drainage evident, no adjacent erythema. Skin: no rashes, adequate skin turgor Neuro: CN II-XII grossly intact, no focal neurologic deficits appreciated, gait was not assessed, A&Ox 3   Labs: Lab Results  Component Value Date   WBC 8.7 03/07/2019   HGB 11.2 (L) 03/07/2019   HCT 37.3 (L) 03/07/2019   MCV 97.9 03/07/2019   PLT 276 03/07/2019   Lab Results  Component Value Date   NA 141 03/07/2019   K 5.0 03/07/2019   CL 104 03/07/2019   CO2 28 03/07/2019   GLUCOSE 151 (H) 03/07/2019   BUN 16 03/07/2019   CREATININE 0.95 03/09/2019   CALCIUM 9.2 03/07/2019   MG 1.9 03/07/2019   PHOS 3.4 03/07/2019   Lab Results  Component Value Date   CRP 2.9 (H) 03/02/2019      Assessment & Plan:  The patient is a 58 year old white male smoker with significant vascular disease status post stenting and prior right first ray amputation, poorly controlled DM, gout, CAD, and anxiety d/o who is now presenting with an advanced left foot wound infection.  Left foot osteomyelitis -patient had copious purulence that is coming from his left lateral midfoot wound along with streaking erythema and significant malodor at his last appointment.  No operative cultures were obtained when he was admitted to the hospital for debridement to his left foot but a left fifth ray amputation was performed with good surgical margins.  Patient will complete vancomycin and Rocephin on March 20, 2019.  His PICC line may be removed on July 25.  Vascular disease -patient is now status post stenting to his affected leg, which actually concerns me that he may develop bacteremia from his infection.    Blood cultures taken as an inpatient were negative, fortunately.  Diabetes- would aim for tight glycemic control with blood sugars less than 150 to optimize his infectious outcome.  Tobacco abuse- I have begged the patient to stop smoking as  this is only worsening his vascular disease and providing poor wound healing along with predisposing him to skin breakdown in the first place.  Unfortunately, the patient does not seem psychiatrically prepared to  stop this habit at this time.  Nicotine replacement products in this setting will likely only help patient stop physically still noting that would not help with his wound healing issues unfortunately.

## 2019-03-16 NOTE — Patient Instructions (Signed)
Call PCP to schedule appointment with them within the next week as your blood sugar is still too high. Limit weight bearing on feet to 1-2 hours a day maximum.

## 2019-03-17 ENCOUNTER — Encounter: Payer: Self-pay | Admitting: Family Medicine

## 2019-03-17 MED ORDER — ALPRAZOLAM 1 MG PO TABS: 1.0000 mg | ORAL_TABLET | Freq: Three times a day (TID) | ORAL | 2 refills | Status: DC | PRN

## 2019-03-17 NOTE — Telephone Encounter (Signed)
This was sent in  

## 2019-03-18 ENCOUNTER — Encounter (HOSPITAL_BASED_OUTPATIENT_CLINIC_OR_DEPARTMENT_OTHER): Payer: 59 | Attending: Physician Assistant

## 2019-03-20 ENCOUNTER — Telehealth: Payer: Self-pay | Admitting: Family Medicine

## 2019-03-20 ENCOUNTER — Telehealth: Payer: Self-pay | Admitting: *Deleted

## 2019-03-20 NOTE — Telephone Encounter (Signed)
He was sent in for refills on the maximum dose of this (10 mg bid) on 03-07-19. If he went from taking 1/2 tab to a whole tab, this is still enough to cover that

## 2019-03-20 NOTE — Telephone Encounter (Signed)
Thank you, his PICC can come out when he has completed his last dose of IV ABX. He is on vancomycin and rocephin.

## 2019-03-20 NOTE — Telephone Encounter (Signed)
Patient is requesting a refill on Glipizide.  He is starting to take it more so he needs a refill.  [facility] [address]

## 2019-03-20 NOTE — Telephone Encounter (Signed)
Patient completes IV vancomycin and rocephin today (7/24).  Please advise if he should continue antibiotics, maintain PICC, or have PICC pulled. Patient does not have follow up appointment at [facility]. Landis Gandy, RN

## 2019-03-20 NOTE — Telephone Encounter (Signed)
Relayed verbal order per Dr Prince Rome to [facility] [place]. Thanks!

## 2019-03-20 NOTE — Telephone Encounter (Signed)
Please advise. I do not see any notes in the patients chart stating he was to increase his medication.

## 2019-03-20 NOTE — Telephone Encounter (Signed)
Spoke with the patient. He stated he has not picked up this prescription yet and will call his pharmacy. Nothing further needed.

## 2019-03-20 NOTE — Telephone Encounter (Signed)
I found these on [facility] beacon (printed in your box)  :  Patient Details DOB: March 19, 1961 Age(y/m/d): [birth year] Gender: M Patient ID: Specimen Details Date collected: 03/19/2019 0700 Local Date received: 03/19/2019 Date entered: 03/19/2019 Date reported: 03/20/2019 0812 ET  TESTS RESULT FLAG UNITS REFERENCE INTERVAL LAB  WBC 7.7 x10E3/uL 3.4-10.8 01  Creatinine 0.89 mg/dL 0.76-1.27 01  C-Reactive Protein, Quant 10 mg/L 0-10 01

## 2019-03-20 NOTE — Telephone Encounter (Signed)
He was seen on Monday by me in the office. We were waiting on his lab results from [facility]. They were not in my box as of Wednesday when I was last in. Can you track down his WBC, Cr, and CRP and type them back to me, so I can decide about his PICC? Thanks

## 2019-03-30 ENCOUNTER — Other Ambulatory Visit: Payer: Self-pay

## 2019-03-30 ENCOUNTER — Encounter: Payer: Self-pay | Admitting: Family Medicine

## 2019-03-30 ENCOUNTER — Ambulatory Visit (INDEPENDENT_AMBULATORY_CARE_PROVIDER_SITE_OTHER): Payer: 59 | Admitting: Family Medicine

## 2019-03-30 DIAGNOSIS — F320 Major depressive disorder, single episode, mild: Secondary | ICD-10-CM | POA: Diagnosis not present

## 2019-03-30 DIAGNOSIS — E118 Type 2 diabetes mellitus with unspecified complications: Secondary | ICD-10-CM

## 2019-03-30 MED ORDER — GLIPIZIDE 5 MG PO TABS: 5.0000 mg | ORAL_TABLET | Freq: Two times a day (BID) | ORAL | 11 refills | Status: DC

## 2019-03-30 NOTE — Progress Notes (Signed)
Virtual Visit via Video Note  I connected with the patient on 03/30/19 at  1:00 PM EDT by a video enabled telemedicine application and verified that I am speaking with the correct person using two identifiers.  Location patient: home Location provider:work or home office Persons participating in the virtual visit: patient, provider  I discussed the limitations of evaluation and management by telemedicine and the availability of in person appointments. The patient expressed understanding and agreed to proceed.   HPI: Here to ask about his diabetes and about possible depression. His last A1c in February was high at 8.0, and after that he took this more seriously and he lost a good deal of weight. He has been taking Metformin twice daily but he admits to taking the Glipizide only sporadically. When he he does take it he takes only 1/2 a tablet because a whole tablet makes his glucose bottom out. His am fasting glucoses lately have been in the range of 180-220. He is recovering from a bout of osteomyelitis which led to a ray amp surgery on the left foot, and he took IV antibiotics via a PICC line for a time. The wound is slowly healing. He also asks about possible depression. Through all this he has been feeling sad and hopeless, and he has little motivation to work or do routine daily activities. His appetite and sleep are preserved. He denies any suicidal thoughts.    ROS: See pertinent positives and negatives per HPI.  Past Medical History:  Diagnosis Date  . Anxiety   . CAD (coronary artery disease)    sees Dr. Jens Somrenshaw, normal Stress test 07-16-12   . Diabetes mellitus   . DJD (degenerative joint disease)   . Gout   . Hyperlipidemia   . Hypertension   . Hypothyroid   . Osteoarthritis   . Renal calculus    hx  . Umbilical hernia     Past Surgical History:  Procedure Laterality Date  . AMPUTATION  07/17/2012   Procedure: AMPUTATION RAY;  Surgeon: Toni ArthursJohn Hewitt, MD;  [facility];   Service: Orthopedics;  Laterality: Right;  right hallux amputation (1st ray resection )  . AMPUTATION Left 03/06/2019   Procedure: Ray Amputation left foot;  Surgeon: Yolonda Kidaogers, Jason Patrick, MD;  Location[facility];  Service: Orthopedics;  Laterality: Left;  . CORONARY ANGIOPLASTY WITH STENT PLACEMENT    . FINGER SURGERY     left index finger  . I & D Toe  05/19/2012   rt great toe  . I&D EXTREMITY  05/19/2012   Procedure: IRRIGATION AND DEBRIDEMENT EXTREMITY;  Surgeon: Senaida LangeKevin M Supple, MD;  Location[facility];  Service: Orthopedics;  Laterality: Right;  Right Great toe  . TONSILLECTOMY      Family History  Problem Relation Age of Onset  . Heart attack Mother   . Arthritis Other   . Coronary artery disease Other   . Diabetes Other   . Hypertension Other   . Prostate cancer Other   . Stroke Other      Current Outpatient Medications:  .  acetaminophen (TYLENOL) 325 MG tablet, Take 2 tablets (650 mg total) by mouth every 6 (six) hours as needed for mild pain (or Fever >/= 101)., Disp:  , Rfl:  .  ALPRAZolam (XANAX) 1 MG tablet, Take 1 tablet (1 mg total) by mouth 3 (three) times daily as needed for anxiety., Disp: 90 tablet, Rfl: 2 .  amLODipine (NORVASC) 10 MG tablet, Take 1 tablet (10 mg  total) by mouth daily. <PLEASE MAKE APPOINTMENT FOR REFILLS>, Disp: 90 tablet, Rfl: 3 .  cloNIDine (CATAPRES) 0.3 MG tablet, TAKE 1 TABLET (0.3 MG TOTAL) BY MOUTH 2 (TWO) TIMES DAILY. NEED OV., Disp: 60 tablet, Rfl: 11 .  colchicine 0.6 MG tablet, Take 1 tablet (0.6 mg total) by mouth every 6 (six) hours as needed (gout)., Disp: 60 tablet, Rfl: 5 .  fenofibrate 160 MG tablet, Take 1 tablet (160 mg total) by mouth daily. NEED OV. (Patient taking differently: Take 160 mg by mouth every evening. NEED OV.), Disp: 90 tablet, Rfl: 3 .  glucose blood (ONE TOUCH ULTRA TEST) test strip, 1 each by Other route daily. Use as instructed, Disp: 100 each, Rfl: 3 .  indomethacin (INDOCIN SR) 75 MG CR capsule, Take 1 capsule  (75 mg total) by mouth 2 (two) times daily with a meal. (Patient taking differently: Take 75 mg by mouth 2 (two) times daily as needed for mild pain. ), Disp: 180 capsule, Rfl: 3 .  levothyroxine (SYNTHROID, LEVOTHROID) 112 MCG tablet, TAKE 1 TABLET BY MOUTH EVERY DAY (Patient taking differently: Take 112 mcg by mouth daily. ), Disp: 30 tablet, Rfl: 11 .  lisinopril-hydrochlorothiazide (PRINZIDE,ZESTORETIC) 20-12.5 MG tablet, Take 1 tablet by mouth 2 (two) times daily. 10AM AND 5PM, Disp: 180 tablet, Rfl: 3 .  metFORMIN (GLUCOPHAGE) 1000 MG tablet, TAKE 1 TABLET BY MOUTH TWO TIMES A DAY WITH A MEAL (Patient taking differently: Take 1,000 mg by mouth 2 (two) times daily with a meal. ), Disp: 180 tablet, Rfl: 3 .  metoprolol succinate (TOPROL-XL) 100 MG 24 hr tablet, Take 1 tablet (100 mg total) by mouth daily., Disp: 180 tablet, Rfl: 3 .  nitroGLYCERIN (NITROSTAT) 0.4 MG SL tablet, Place 1 tablet (0.4 mg total) under the tongue every 5 (five) minutes as needed. For chest pain. (Patient taking differently: Place 0.4 mg under the tongue every 5 (five) minutes as needed for chest pain. ), Disp: 25 tablet, Rfl: 5 .  rosuvastatin (CRESTOR) 40 MG tablet, Take 1 tablet (40 mg total) by mouth daily. <PLEASE MAKE APPOINTMENT FOR REFILLS> (Patient taking differently: Take 40 mg by mouth every evening. <PLEASE MAKE APPOINTMENT FOR REFILLS>), Disp: 90 tablet, Rfl: 3 .  sitaGLIPtin (JANUVIA) 100 MG tablet, Take 1 tablet (100 mg total) by mouth daily., Disp: 90 tablet, Rfl: 3 .  zolpidem (AMBIEN) 10 MG tablet, TAKE 1 TABLET BY MOUTH EVERYDAY AT BEDTIME (Patient taking differently: Take 10 mg by mouth at bedtime as needed for sleep. ), Disp: 90 tablet, Rfl: 1 .  ezetimibe (ZETIA) 10 MG tablet, Take 1 tablet (10 mg total) by mouth daily., Disp: 90 tablet, Rfl: 3 .  glipiZIDE (GLUCOTROL) 5 MG tablet, Take 1 tablet (5 mg total) by mouth 2 (two) times daily before a meal., Disp: 60 tablet, Rfl: 11  EXAM:  VITALS per  patient if applicable:  GENERAL: alert, oriented, appears well and in no acute distress  HEENT: atraumatic, conjunttiva clear, no obvious abnormalities on inspection of external nose and ears  NECK: normal movements of the head and neck  LUNGS: on inspection no signs of respiratory distress, breathing rate appears normal, no obvious gross SOB, gasping or wheezing  CV: no obvious cyanosis  MS: moves all visible extremities without noticeable abnormality  PSYCH/NEURO: pleasant and cooperative, no obvious depression or anxiety, speech and thought processing grossly intact  ASSESSMENT AND PLAN: His diabetes is out of control so I urged him to take his Glipizide and his Metformin  regularly every day. We will reduce the dose of Glipizide to 5 mg BID. He will watch a astrict diet. He does shows some symptoms of mild depression, and his latest health issues are certainly to blame. He declines any therapy for now, either with medication or therapy. I encouraged him to get back with us if anything changes.  Gershon CraneStephen Valta Dillon, MD  Discussed the following assessment and plan:  No diagnosis found.     I discussed the assessment and treatment plan with the patient. The patient was provided an opportunity to ask questions and all were answered. The patient agreed with the plan and demonstrated an understanding of the instructions.   The patient was advised to call back or seek an in-person evaluation if the symptoms worsen or if the condition fails to improve as anticipated.

## 2019-04-07 ENCOUNTER — Encounter: Payer: Self-pay | Admitting: Family Medicine

## 2019-04-08 NOTE — Telephone Encounter (Signed)
Please clarify what he means by 2 prescriptions

## 2019-04-13 NOTE — Telephone Encounter (Signed)
The prescriptions are ready

## 2019-04-17 ENCOUNTER — Encounter: Payer: Self-pay | Admitting: Family Medicine

## 2019-04-23 ENCOUNTER — Telehealth: Payer: Self-pay | Admitting: Family Medicine

## 2019-04-23 NOTE — Telephone Encounter (Signed)
[facility] [address]  Patient is out of this medication.

## 2019-04-24 MED ORDER — HYDROCODONE-ACETAMINOPHEN 5-325 MG PO TABS: 1.0000 | ORAL_TABLET | Freq: Four times a day (QID) | ORAL | 0 refills | Status: AC | PRN

## 2019-04-24 NOTE — Telephone Encounter (Signed)
Please advise Rx was discontinued at the hospital.

## 2019-04-24 NOTE — Telephone Encounter (Signed)
Left message for patient to call back. CRM created 

## 2019-04-24 NOTE — Telephone Encounter (Signed)
I sent in a 30 day supply. After that he will need a PMV for any refills

## 2019-04-27 NOTE — Telephone Encounter (Signed)
Left message for patient to call back  

## 2019-05-07 ENCOUNTER — Encounter: Payer: Self-pay | Admitting: Family Medicine

## 2019-05-07 MED ORDER — ZOLPIDEM TARTRATE 10 MG PO TABS: 10.0000 mg | ORAL_TABLET | Freq: Every evening | ORAL | 1 refills | Status: DC | PRN

## 2019-05-07 NOTE — Telephone Encounter (Signed)
Last filled 09/22/2018 Last OV 03/30/2019  Ok to fill?

## 2019-05-07 NOTE — Telephone Encounter (Signed)
I sent in the refill.

## 2019-05-12 ENCOUNTER — Encounter: Payer: Self-pay | Admitting: Family Medicine

## 2019-05-13 ENCOUNTER — Encounter: Payer: Self-pay | Admitting: Family Medicine

## 2019-05-13 NOTE — Telephone Encounter (Signed)
Just send me the documents so I can get a look at them first

## 2019-05-14 NOTE — Telephone Encounter (Signed)
Just let me see the documents and I will decide

## 2019-05-15 ENCOUNTER — Encounter: Payer: Self-pay | Admitting: Family Medicine

## 2019-05-15 NOTE — Telephone Encounter (Signed)
Have him schedule an in person OV so we can fill these documents out together

## 2019-05-19 ENCOUNTER — Other Ambulatory Visit: Payer: Self-pay | Admitting: Family Medicine

## 2019-06-24 ENCOUNTER — Encounter: Payer: Self-pay | Admitting: Family Medicine

## 2019-06-24 NOTE — Telephone Encounter (Signed)
Please advise 

## 2019-06-24 NOTE — Telephone Encounter (Signed)
Call in Keflex 500 mg TID for 10 days. If this is not improving, he will need an OV

## 2019-06-29 MED ORDER — CEPHALEXIN 500 MG PO CAPS: 500.0000 mg | ORAL_CAPSULE | Freq: Three times a day (TID) | ORAL | 0 refills | Status: DC

## 2019-06-29 NOTE — Telephone Encounter (Signed)
Done

## 2019-07-01 ENCOUNTER — Encounter: Payer: Self-pay | Admitting: Family Medicine

## 2019-07-04 ENCOUNTER — Other Ambulatory Visit: Payer: Self-pay | Admitting: Family Medicine

## 2019-07-06 MED ORDER — ROSUVASTATIN CALCIUM 40 MG PO TABS: 40.0000 mg | ORAL_TABLET | Freq: Every day | ORAL | 3 refills | Status: DC

## 2019-07-14 ENCOUNTER — Ambulatory Visit: Payer: 59 | Admitting: Family Medicine

## 2019-07-14 ENCOUNTER — Encounter: Payer: Self-pay | Admitting: Family Medicine

## 2019-07-14 ENCOUNTER — Other Ambulatory Visit: Payer: Self-pay

## 2019-07-14 VITALS — BP 140/68 | HR 63 | Temp 98.0°F | Ht 72.0 in | Wt 212.0 lb

## 2019-07-14 DIAGNOSIS — G8929 Other chronic pain: Secondary | ICD-10-CM

## 2019-07-14 DIAGNOSIS — F1190 Opioid use, unspecified, uncomplicated: Secondary | ICD-10-CM | POA: Diagnosis not present

## 2019-07-14 DIAGNOSIS — M25511 Pain in right shoulder: Secondary | ICD-10-CM | POA: Diagnosis not present

## 2019-07-14 DIAGNOSIS — R69 Illness, unspecified: Secondary | ICD-10-CM | POA: Diagnosis not present

## 2019-07-14 DIAGNOSIS — Z23 Encounter for immunization: Secondary | ICD-10-CM

## 2019-07-14 MED ORDER — HYDROCODONE-ACETAMINOPHEN 10-325 MG PO TABS: 1.0000 | ORAL_TABLET | Freq: Four times a day (QID) | ORAL | 0 refills | Status: DC | PRN

## 2019-07-14 MED ORDER — HYDROCODONE-ACETAMINOPHEN 10-325 MG PO TABS: 1.0000 | ORAL_TABLET | Freq: Four times a day (QID) | ORAL | 0 refills | Status: AC | PRN

## 2019-07-14 NOTE — Progress Notes (Signed)
   Subjective:    Patient ID: Robert Huang, male    DOB: 1960/11/25, 58 y.o.   MRN: [id number]  HPI Here for pain management. He has right shoulder pain and now foot pain after the ray amp surgery he had this summer. He asks if the dosage of Norco can be increased.  Indication for chronic opioid: shoulder pain Medication and dose: Norco 5-325 # pills per month: 120 Last UDS date: 12-03-18 Opioid Treatment Agreement signed (Y/N): 12-10-18 Opioid Treatment Agreement last reviewed with patient:  07-14-19 N[facility] reviewed this encounter (include red flags):  07-14-19    Review of Systems  Constitutional: Negative.   Respiratory: Negative.   Cardiovascular: Negative.   Musculoskeletal: Positive for arthralgias.       Objective:   Physical Exam Constitutional:      Appearance: Normal appearance.  Neurological:     Mental Status: He is alert.           Assessment & Plan:  Pain management. We will increase the Norco to 10-325 as needed.  Alysia Penna, MD

## 2019-07-27 ENCOUNTER — Other Ambulatory Visit: Payer: Self-pay | Admitting: Family Medicine

## 2019-07-29 NOTE — Telephone Encounter (Signed)
Last OV 07/14/19 Last refill 03/17/19 #90/2 Next OV not scheduled

## 2019-08-11 DIAGNOSIS — Z03818 Encounter for observation for suspected exposure to other biological agents ruled out: Secondary | ICD-10-CM | POA: Diagnosis not present

## 2019-08-11 DIAGNOSIS — Z20828 Contact with and (suspected) exposure to other viral communicable diseases: Secondary | ICD-10-CM | POA: Diagnosis not present

## 2019-08-24 ENCOUNTER — Other Ambulatory Visit: Payer: Self-pay | Admitting: Family Medicine

## 2019-09-05 ENCOUNTER — Other Ambulatory Visit: Payer: Self-pay | Admitting: Family Medicine

## 2019-09-10 ENCOUNTER — Other Ambulatory Visit: Payer: Self-pay | Admitting: Family Medicine

## 2019-10-06 ENCOUNTER — Other Ambulatory Visit: Payer: Self-pay | Admitting: Family Medicine

## 2019-10-19 ENCOUNTER — Other Ambulatory Visit: Payer: Self-pay

## 2019-10-19 ENCOUNTER — Encounter: Payer: Self-pay | Admitting: Family Medicine

## 2019-10-19 ENCOUNTER — Ambulatory Visit (INDEPENDENT_AMBULATORY_CARE_PROVIDER_SITE_OTHER): Payer: 59 | Admitting: Family Medicine

## 2019-10-19 VITALS — BP 150/62 | HR 72 | Temp 97.8°F | Wt 215.0 lb

## 2019-10-19 DIAGNOSIS — E118 Type 2 diabetes mellitus with unspecified complications: Secondary | ICD-10-CM

## 2019-10-19 DIAGNOSIS — E11621 Type 2 diabetes mellitus with foot ulcer: Secondary | ICD-10-CM

## 2019-10-19 DIAGNOSIS — L97528 Non-pressure chronic ulcer of other part of left foot with other specified severity: Secondary | ICD-10-CM

## 2019-10-19 LAB — POCT GLYCOSYLATED HEMOGLOBIN (HGB A1C): Hemoglobin A1C: 7.6 % — AB (ref 4.0–5.6)

## 2019-10-19 MED ORDER — CEPHALEXIN 500 MG PO CAPS: 500.0000 mg | ORAL_CAPSULE | Freq: Three times a day (TID) | ORAL | 0 refills | Status: DC

## 2019-10-19 MED ORDER — SITAGLIPTIN PHOSPHATE 100 MG PO TABS: 100.0000 mg | ORAL_TABLET | Freq: Every day | ORAL | 3 refills | Status: DC

## 2019-10-19 MED ORDER — METFORMIN HCL 1000 MG PO TABS: ORAL_TABLET | ORAL | 3 refills | Status: DC

## 2019-10-19 NOTE — Progress Notes (Signed)
   Subjective:    Patient ID: Robert Huang, male    DOB: [birth year], 59 y.o.   MRN: [id number]  HPI Here to check a lesion on the left lateral foot that appeared one week ago. This is not painful, but he does have a lot of neuropathic numbness in both feet. This started as a red spot that then enlarged and began to drain clear fluid. He had a supply of Keflex at home, so he started taking this about 5 days ago. The lesion has healed up somewhat since then, the drainage has stopped and it has become smaller in size. He has not been checking his glucoses for awhile. The only diabetes medication he is taking is Metformin 1000 mg bid. He stopped the Glipizide because it was dropping his glucoses too low. He never got the Januvia filled because his insurance last year would not cover it and that made it too expensive.  We checked an A1c today and it was 7.6.   Review of Systems  Constitutional: Negative.   Respiratory: Negative.   Cardiovascular: Negative.   Skin: Positive for wound.       Objective:   Physical Exam Constitutional:      Appearance: Normal appearance.  Cardiovascular:     Rate and Rhythm: Normal rate and regular rhythm.     Pulses: Normal pulses.     Heart sounds: Normal heart sounds.  Pulmonary:     Effort: Pulmonary effort is normal.     Breath sounds: Normal breath sounds.  Skin:    Comments: The left lateral foot has a shallow scabbed lesion over the 5th metatarsal head. This is not tender. No erythema.   Neurological:     Mental Status: He is alert.           Assessment & Plan:  He has a partially healed diabetic foot ulcer. He had not been wearing his diabetic shoes lately but he does wear them today. I advised him to wear these and the accompanying inserts at all times to protect his feet. He will finish out the 10 day course of Keflex. For the diabetes, he has a new insurance this year so we will prescribe the Januvia 100 mg daily once again. Recheck in  one week.  Gershon Crane, MD

## 2019-10-21 ENCOUNTER — Other Ambulatory Visit: Payer: Self-pay | Admitting: Family Medicine

## 2019-11-02 ENCOUNTER — Encounter: Payer: Self-pay | Admitting: Family Medicine

## 2019-11-04 NOTE — Telephone Encounter (Signed)
Do you have his form? Could you put in the date he mentioned?

## 2019-11-05 NOTE — Telephone Encounter (Signed)
Forms have been completed. Patient is aware.   requesting a refill on hydrocodone.  Last filled 09/13/2019 Last OV 10/19/2019  Ok to fill?

## 2019-11-12 ENCOUNTER — Other Ambulatory Visit: Payer: Self-pay | Admitting: Family Medicine

## 2019-11-12 DIAGNOSIS — E7800 Pure hypercholesterolemia, unspecified: Secondary | ICD-10-CM

## 2019-11-13 ENCOUNTER — Encounter: Payer: Self-pay | Admitting: Family Medicine

## 2019-11-13 ENCOUNTER — Ambulatory Visit: Payer: 59 | Admitting: Family Medicine

## 2019-11-13 ENCOUNTER — Other Ambulatory Visit: Payer: Self-pay

## 2019-11-13 VITALS — BP 140/62 | HR 57 | Temp 97.8°F | Ht 72.0 in | Wt 216.0 lb

## 2019-11-13 DIAGNOSIS — R109 Unspecified abdominal pain: Secondary | ICD-10-CM | POA: Diagnosis not present

## 2019-11-13 LAB — POC URINALSYSI DIPSTICK (AUTOMATED)
Bilirubin, UA: NEGATIVE
Blood, UA: NEGATIVE
Glucose, UA: NEGATIVE
Ketones, UA: NEGATIVE
Leukocytes, UA: NEGATIVE
Nitrite, UA: NEGATIVE
Protein, UA: NEGATIVE
Spec Grav, UA: 1.015 (ref 1.010–1.025)
Urobilinogen, UA: 0.2 E.U./dL
pH, UA: 5 (ref 5.0–8.0)

## 2019-11-13 MED ORDER — HYDROCODONE-ACETAMINOPHEN 10-325 MG PO TABS: 1.0000 | ORAL_TABLET | ORAL | 0 refills | Status: DC | PRN

## 2019-11-13 NOTE — Progress Notes (Signed)
   Subjective:    Patient ID: Robert Huang, male    DOB: 1960/10/11, 59 y.o.   MRN: [id number]  HPI Here for one week of right sided back and flank pains. He has had a constant aching type pain in the right middle back which waxes and wanes. Then 2 days ago he also developed some sharp more severe pains that shoot from the right flank to the RUQ under the ribs. These pains last only a few seconds. He has no urinary symptoms. He has been constipated this past week however, which is unusual for him. No fever, no nausea or vomiting. Appetite is normal.    Review of Systems  Constitutional: Negative.   Respiratory: Negative.   Cardiovascular: Negative.   Gastrointestinal: Positive for abdominal pain and constipation. Negative for abdominal distention, anal bleeding, blood in stool, diarrhea, nausea and vomiting.  Genitourinary: Positive for flank pain. Negative for difficulty urinating, dysuria, frequency, hematuria and urgency.       Objective:   Physical Exam Constitutional:      General: He is not in acute distress.    Appearance: Normal appearance.  Cardiovascular:     Rate and Rhythm: Normal rate and regular rhythm.     Pulses: Normal pulses.     Heart sounds: Normal heart sounds.  Pulmonary:     Effort: Pulmonary effort is normal.     Breath sounds: Normal breath sounds.  Abdominal:     General: Abdomen is flat. Bowel sounds are normal. There is no distension.     Palpations: Abdomen is soft. There is no mass.     Tenderness: There is no right CVA tenderness, left CVA tenderness, guarding or rebound.     Hernia: No hernia is present.     Comments: He is tender in the RUQ under the rib margin   Neurological:     Mental Status: He is alert.           Assessment & Plan:  I think his abdominal pain is the result of constipation and gas building up in his colon. He will try magnesium citrate tonight and he will begin drinking more water and using Miralax daily. Recheck  prn.  Gershon Crane, MD

## 2019-11-17 ENCOUNTER — Other Ambulatory Visit: Payer: Self-pay | Admitting: Family Medicine

## 2019-11-19 ENCOUNTER — Encounter: Payer: Self-pay | Admitting: Family Medicine

## 2019-11-19 ENCOUNTER — Ambulatory Visit (INDEPENDENT_AMBULATORY_CARE_PROVIDER_SITE_OTHER): Payer: 59 | Admitting: Family Medicine

## 2019-11-19 ENCOUNTER — Other Ambulatory Visit: Payer: Self-pay

## 2019-11-19 VITALS — BP 122/70 | HR 70 | Temp 97.6°F | Ht 72.0 in | Wt 220.0 lb

## 2019-11-19 DIAGNOSIS — M25511 Pain in right shoulder: Secondary | ICD-10-CM | POA: Diagnosis not present

## 2019-11-19 DIAGNOSIS — G8929 Other chronic pain: Secondary | ICD-10-CM

## 2019-11-19 DIAGNOSIS — R69 Illness, unspecified: Secondary | ICD-10-CM | POA: Diagnosis not present

## 2019-11-19 DIAGNOSIS — F1190 Opioid use, unspecified, uncomplicated: Secondary | ICD-10-CM | POA: Diagnosis not present

## 2019-11-19 MED ORDER — HYDROCODONE-ACETAMINOPHEN 10-325 MG PO TABS: 1.0000 | ORAL_TABLET | Freq: Four times a day (QID) | ORAL | 0 refills | Status: DC | PRN

## 2019-11-19 NOTE — Progress Notes (Signed)
   Subjective:    Patient ID: Robert Huang, male    DOB: 1961/07/24, 59 y.o.   MRN: [id number]  HPI Here for pain management, he is doing well.  Indication for chronic opioid: shoulder and foot pain  Medication and dose: Norco 10-325 # pills per month: 120 Last UDS date: 12-03-18 Opioid Treatment Agreement signed (Y/N): 12-10-18 Opioid Treatment Agreement last reviewed with patient:  11-19-19 N[facility] reviewed this encounter (include red flags): Yes    Review of Systems     Objective:   Physical Exam        Assessment & Plan:  Pain management, meds were refilled.  Gershon Crane, MD

## 2019-11-21 ENCOUNTER — Other Ambulatory Visit: Payer: Self-pay | Admitting: Family Medicine

## 2019-11-22 LAB — PAIN MGMT, PROFILE 8 W/CONF, U
6 Acetylmorphine: NEGATIVE ng/mL
Alcohol Metabolites: POSITIVE ng/mL — AB (ref <500)
Alphahydroxyalprazolam: 128 ng/mL
Alphahydroxymidazolam: NEGATIVE ng/mL
Alphahydroxytriazolam: NEGATIVE ng/mL
Aminoclonazepam: NEGATIVE ng/mL
Amphetamines: NEGATIVE ng/mL
Benzodiazepines: POSITIVE ng/mL
Buprenorphine, Urine: NEGATIVE ng/mL
Cocaine Metabolite: NEGATIVE ng/mL
Codeine: NEGATIVE ng/mL
Creatinine: 121.6 mg/dL
Ethyl Glucuronide (ETG): >100000 ng/mL
Ethyl Sulfate (ETS): 71223 ng/mL
Hydrocodone: 704 ng/mL
Hydromorphone: 427 ng/mL
Hydroxyethylflurazepam: NEGATIVE ng/mL
Lorazepam: NEGATIVE ng/mL
MDA: NEGATIVE ng/mL
MDMA: NEGATIVE ng/mL
MDMA: NEGATIVE ng/mL
Marijuana Metabolite: NEGATIVE ng/mL
Morphine: NEGATIVE ng/mL
Nordiazepam: NEGATIVE ng/mL
Norhydrocodone: 1012 ng/mL
Opiates: POSITIVE ng/mL
Oxazepam: NEGATIVE ng/mL
Oxidant: NEGATIVE ug/mL
Oxycodone: NEGATIVE ng/mL
Temazepam: NEGATIVE ng/mL
pH: 5.3 (ref 4.5–9.0)

## 2019-11-23 ENCOUNTER — Other Ambulatory Visit: Payer: Self-pay | Admitting: Family Medicine

## 2019-11-23 NOTE — Telephone Encounter (Signed)
Last Rx given on 9/10 for #90 with 1 ref

## 2019-12-01 ENCOUNTER — Other Ambulatory Visit: Payer: Self-pay | Admitting: Family Medicine

## 2019-12-28 ENCOUNTER — Encounter: Payer: Self-pay | Admitting: Family Medicine

## 2020-01-28 ENCOUNTER — Encounter: Payer: Self-pay | Admitting: Family Medicine

## 2020-01-28 ENCOUNTER — Other Ambulatory Visit: Payer: Self-pay | Admitting: Family Medicine

## 2020-01-28 NOTE — Telephone Encounter (Signed)
Patient will need a PMV for 02/19/2020

## 2020-01-28 NOTE — Telephone Encounter (Signed)
Last filled 01/19/2020 Last OV 11/19/2019  Ok to fill?

## 2020-01-31 ENCOUNTER — Other Ambulatory Visit: Payer: Self-pay | Admitting: Family Medicine

## 2020-02-09 ENCOUNTER — Other Ambulatory Visit: Payer: Self-pay

## 2020-02-09 ENCOUNTER — Ambulatory Visit: Payer: No Typology Code available for payment source | Admitting: Family Medicine

## 2020-02-09 ENCOUNTER — Encounter: Payer: Self-pay | Admitting: Family Medicine

## 2020-02-09 VITALS — BP 120/64 | HR 62 | Temp 97.5°F | Wt 218.4 lb

## 2020-02-09 DIAGNOSIS — F1190 Opioid use, unspecified, uncomplicated: Secondary | ICD-10-CM | POA: Diagnosis not present

## 2020-02-09 DIAGNOSIS — G8929 Other chronic pain: Secondary | ICD-10-CM

## 2020-02-09 DIAGNOSIS — M25511 Pain in right shoulder: Secondary | ICD-10-CM | POA: Diagnosis not present

## 2020-02-09 DIAGNOSIS — R69 Illness, unspecified: Secondary | ICD-10-CM | POA: Diagnosis not present

## 2020-02-09 MED ORDER — HYDROCODONE-ACETAMINOPHEN 10-325 MG PO TABS: 1.0000 | ORAL_TABLET | Freq: Four times a day (QID) | ORAL | 0 refills | Status: DC | PRN

## 2020-02-09 MED ORDER — HYDROCODONE-ACETAMINOPHEN 10-325 MG PO TABS: 1.0000 | ORAL_TABLET | Freq: Four times a day (QID) | ORAL | 0 refills | Status: AC | PRN

## 2020-02-09 NOTE — Progress Notes (Signed)
   Subjective:    Patient ID: Robert Huang, male    DOB: 01/12/1961, 59 y.o.   MRN: [id number]  HPI Here for pain management, he is doing well.  Indication for chronic opioid: shoulder and foot pain Medication and dose: Norco 10-325 # pills per month: 120 Last UDS date: 11-19-19 Opioid Treatment Agreement signed (Y/N): 12-10-18  Opioid Treatment Agreement last reviewed with patient:  02-09-20 N[facility] reviewed this encounter (include red flags): Yes    Review of Systems     Objective:   Physical Exam        Assessment & Plan:  Pain management, meds were refilled.  Gershon Crane, MD

## 2020-02-21 ENCOUNTER — Other Ambulatory Visit: Payer: Self-pay | Admitting: Family Medicine

## 2020-02-29 ENCOUNTER — Other Ambulatory Visit: Payer: Self-pay | Admitting: Family Medicine

## 2020-03-02 ENCOUNTER — Encounter: Payer: Self-pay | Admitting: Family Medicine

## 2020-03-03 NOTE — Telephone Encounter (Signed)
Ok for refill?  Pt LOV pain management 01/2020  Last refill 11/2019

## 2020-03-04 ENCOUNTER — Encounter: Payer: Self-pay | Admitting: Family Medicine

## 2020-03-07 ENCOUNTER — Telehealth: Payer: Self-pay | Admitting: Family Medicine

## 2020-03-07 MED ORDER — ALPRAZOLAM 1 MG PO TABS: 1.0000 mg | ORAL_TABLET | Freq: Three times a day (TID) | ORAL | 5 refills | Status: DC | PRN

## 2020-03-07 NOTE — Telephone Encounter (Signed)
No longer needed

## 2020-03-07 NOTE — Telephone Encounter (Signed)
I already refilled the Xanax

## 2020-03-07 NOTE — Telephone Encounter (Signed)
Last filled 07/31/2019 Last OV 06/15/20021  Ok to fill?

## 2020-03-07 NOTE — Telephone Encounter (Signed)
Done

## 2020-03-07 NOTE — Telephone Encounter (Signed)
Pt is calling in stating that he is out of alprazolam   Pharm:  [facility] [address]

## 2020-04-25 ENCOUNTER — Encounter: Payer: Self-pay | Admitting: Family Medicine

## 2020-04-25 NOTE — Progress Notes (Deleted)
Cardiology Clinic Note   Patient Name: Robert Huang PZWCHENIDP Date of Encounter: 04/27/2020  Primary Care Provider:  [name], MD Primary Cardiologist:  [name], MD  Patient Profile    Robert Huang presents the clinic today for follow-up evaluation of his coronary artery disease hypertension, and hyperlipidemia.  Past Medical History    Past Medical History:  Diagnosis Date   Anxiety    CAD (coronary artery disease)    sees Dr. [name], normal Stress test 07-16-12    Diabetes mellitus    DJD (degenerative joint disease)    Gout    Hyperlipidemia    Hypertension    Hypothyroid    Osteoarthritis    Renal calculus    hx   Umbilical hernia    Past Surgical History:  Procedure Laterality Date   AMPUTATION  07/17/2012   Procedure: AMPUTATION RAY;  Surgeon: [name], MD;  Location: [facility] OR;  Service: Orthopedics;  Laterality: Right;  right hallux amputation (1st ray resection )   AMPUTATION Left 03/06/2019   Procedure: Ray Amputation left foot;  Surgeon: [name], MD;  Location: [facility];  Service: Orthopedics;  Laterality: Left;   CORONARY ANGIOPLASTY WITH STENT PLACEMENT     FINGER SURGERY     left index finger   I & D EXTREMITY  05/19/2012   Procedure: IRRIGATION AND DEBRIDEMENT EXTREMITY;  Surgeon: [name], MD;  Location: [facility] OR;  Service: Orthopedics;  Laterality: Right;  Right Great toe   I & D Toe  05/19/2012   rt great toe   TONSILLECTOMY      Allergies  No Known Allergies  History of Present Illness    Robert Huang has a PMH of CAD, HLD, essential hypertension, and diabetes.  He underwent cardiac catheterization and received PCI with DES to circumflex and DES to his RCA 10/2008.  His LV function at that time was normal.  Abdominal ultrasound 12/10 showed no aneurysm and no renal artery stenosis.  Nuclear stress test 11/13 showed EF of 61% normal perfusion.  He was last seen by Dr. [name] on 05/21/2018.  During  that time he had increased work of breathing with more extreme activities but not with normal daily activities.  This was relieved with rest.  He denied exertional chest pain.  He denied orthopnea, PND, lower extremity edema syncope and palpitations.  He presents to the clinic today for follow-up evaluation and states***  *** denies chest pain, shortness of breath, lower extremity edema, fatigue, palpitations, melena, hematuria, hemoptysis, diaphoresis, weakness, presyncope, syncope, orthopnea, and PND.   Home Medications    Prior to Admission medications   Medication Sig Start Date End Date Taking? Authorizing Provider  acetaminophen (TYLENOL) 325 MG tablet Take 2 tablets (650 mg total) by mouth every 6 (six) hours as needed for mild pain (or Fever >/= 101). 03/09/19   [name], [name], DO  ALPRAZolam Prudy Feeler) 1 MG tablet Take 1 tablet (1 mg total) by mouth 3 (three) times daily as needed for anxiety. 03/07/20   [name], MD  amLODipine (NORVASC) 10 MG tablet TAKE 1 TABLET (10 MG TOTAL) BY MOUTH DAILY. <PLEASE MAKE APPOINTMENT FOR REFILLS> 10/21/19   [name], MD  cloNIDine (CATAPRES) 0.3 MG tablet TAKE 1 TABLET (0.3 MG TOTAL) BY MOUTH 2 (TWO) TIMES DAILY. NEED OV. 11/13/19   [name], MD  colchicine 0.6 MG tablet TAKE 1 TABLET (0.6 MG TOTAL) BY MOUTH EVERY 6 (SIX) HOURS AS  NEEDED (GOUT). 02/22/20   [name], MD  ezetimibe (ZETIA) 10 MG tablet TAKE 1 TABLET BY MOUTH EVERY DAY 11/13/19   [name], MD  fenofibrate 160 MG tablet TAKE 1 TABLET (160 MG TOTAL) BY MOUTH DAILY. NEED OV. 12/02/19   [name], MD  HYDROcodone-acetaminophen [facility]) 10-325 MG tablet Take 1 tablet by mouth every 6 (six) hours as needed for severe pain. 04/10/20 05/10/20  [name], MD  indomethacin (INDOCIN SR) 75 MG CR capsule TAKE 1 CAPSULE (75 MG TOTAL) BY MOUTH 2 (TWO) TIMES DAILY WITH A MEAL. 05/22/19   [name], MD  levothyroxine (SYNTHROID) 112 MCG tablet TAKE 1 TABLET BY MOUTH EVERY  DAY 11/21/19   [name], MD  lisinopril-hydrochlorothiazide (ZESTORETIC) 20-12.5 MG tablet TAKE 1 TABLET BY MOUTH 2 (TWO) TIMES DAILY. 10AM AND 5PM 09/10/19   [name], MD  metFORMIN (GLUCOPHAGE) 1000 MG tablet TAKE 1 TABLET BY MOUTH TWO TIMES A DAY WITH A MEAL 10/19/19   [name], MD  metoprolol succinate (TOPROL-XL) 100 MG 24 hr tablet TAKE 1 TABLET BY MOUTH TWICE A DAY 10/06/19   [name], MD  nitroGLYCERIN (NITROSTAT) 0.4 MG SL tablet Place 1 tablet (0.4 mg total) under the tongue every 5 (five) minutes as needed. For chest pain. Patient taking differently: Place 0.4 mg under the tongue every 5 (five) minutes as needed for chest pain.  05/21/18   Lewayne Bunting, MD  ONETOUCH ULTRA test strip 1 EACH BY OTHER ROUTE DAILY. USE AS INSTRUCTED 07/06/19   [name], MD  rosuvastatin (CRESTOR) 40 MG tablet TAKE 1 TABLET (40 MG TOTAL) BY MOUTH DAILY. <PLEASE MAKE APPOINTMENT FOR REFILLS> 02/01/20   [name], MD  sitaGLIPtin (JANUVIA) 100 MG tablet Take 1 tablet (100 mg total) by mouth daily. 10/19/19   [name], MD  zolpidem (AMBIEN) 10 MG tablet TAKE 1 TABLET (10 MG TOTAL) BY MOUTH AT BEDTIME AS NEEDED FOR SLEEP. 11/25/19   [name], MD    Family History    Family History  Problem Relation Age of Onset   Heart attack Mother    Arthritis Other    Coronary artery disease Other    Diabetes Other    Hypertension Other    Prostate cancer Other    Stroke Other    He indicated that his mother is deceased. He indicated that his father is deceased. He indicated that his sister is deceased. He indicated that only one of his four brothers is alive.  Social History    Social History   Socioeconomic History   Marital status: Married    Spouse name: Not on file   Number of children: Not on file   Years of education: Not on file   Highest education level: Not on file  Occupational History   Not on file  Tobacco Use   Smoking status: Never Smoker    Smokeless tobacco: Current User    Types: Chew   Tobacco comment: occ  Substance and Sexual Activity   Alcohol use: Yes    Alcohol/week: 0.0 standard drinks    Comment: weekends   Drug use: No   Sexual activity: Yes  Other Topics Concern   Not on file  Social History Narrative   Not on file   Social Determinants of Health   Financial Resource Strain:    Difficulty of Paying Living Expenses: Not on file  Food Insecurity:  Worried About Programme researcher, broadcasting/film/videounning Out of Food in the Last Year: Not on file   [employer] Out of Food in the Last Year: Not on file  Transportation Needs:    Lack of Transportation (Medical): Not on file   Lack of Transportation (Non-Medical): Not on file  Physical Activity:    Days of Exercise per Week: Not on file   Minutes of Exercise per Session: Not on file  Stress:    Feeling of Stress : Not on file  Social Connections:    Frequency of Communication with Friends and Family: Not on file   Frequency of Social Gatherings with Friends and Family: Not on file   Attends Religious Services: Not on file   Active Member of Clubs or Organizations: Not on file   Attends BankerClub or Organization Meetings: Not on file   Marital Status: Not on file  Intimate Partner Violence:    Fear of Current or Ex-Partner: Not on file   Emotionally Abused: Not on file   Physically Abused: Not on file   Sexually Abused: Not on file     Review of Systems    General:  No chills, fever, night sweats or weight changes.  Cardiovascular:  No chest pain, dyspnea on exertion, edema, orthopnea, palpitations, paroxysmal nocturnal dyspnea. Dermatological: No rash, lesions/masses Respiratory: No cough, dyspnea Urologic: No hematuria, dysuria Abdominal:   No nausea, vomiting, diarrhea, bright red blood per rectum, melena, or hematemesis Neurologic:  No visual changes, wkns, changes in mental status. All other systems reviewed and are otherwise negative except as noted  above.  Physical Exam    VS:  There were no vitals taken for this visit. , BMI There is no height or weight on file to calculate BMI. GEN: Well nourished, well developed, in no acute distress. HEENT: normal. Neck: Supple, no JVD, carotid bruits, or masses. Cardiac: RRR, no murmurs, rubs, or gallops. No clubbing, cyanosis, edema.  Radials/DP/PT 2+ and equal bilaterally.  Respiratory:  Respirations regular and unlabored, clear to auscultation bilaterally. GI: Soft, nontender, nondistended, BS + x 4. MS: no deformity or atrophy. Skin: warm and dry, no rash. Neuro:  Strength and sensation are intact. Psych: Normal affect.  Accessory Clinical Findings    Recent Labs: No results found for requested labs within last 8760 hours.   Recent Lipid Panel    Component Value Date/Time   CHOL 133 10/03/2018 1304   CHOL 187 05/26/2018 1606   TRIG 146.0 10/03/2018 1304   HDL 43.50 10/03/2018 1304   HDL 38 (L) 05/26/2018 1606   CHOLHDL 3 10/03/2018 1304   VLDL 29.2 10/03/2018 1304   LDLCALC 61 10/03/2018 1304   LDLCALC 91 05/26/2018 1606   LDLDIRECT 129.0 12/03/2017 1351    ECG personally reviewed by me today- *** - No acute changes  EKG 03/03/2019 Normal sinus rhythm minimal voltage criteria for LVH 66 bpm  Nuclear stress test 07/16/2012 EF of 61% normal perfusion.  Assessment & Plan   1.  Coronary artery disease-no chest pain today.  No recent episodes of chest discomfort, exertional chest pain.  Nuclear stress test 11/13 showed normal EF 61% and normal perfusion Continue aspirin, amlodipine, ezetimibe metoprolol, nitroglycerin HCTZ lisinopril, rosuvastatin Heart healthy low-sodium diet-salty 6 given Increase physical activity as tolerated  Essential hypertension-BP today***.  Well-controlled at home Continue amlodipine, metoprolol, lisinopril/HCTZ Heart healthy low-sodium diet-salty 6 given Increase physical activity as tolerated  Hyperlipidemia-LDL 61 on 10/03/2018 Continue  ezetimibe, rosuvastatin Heart healthy low-sodium high-fiber diet Increase physical activity as  tolerated  Disposition: Follow-up with Dr. [name] in 1 year.  Robert Huang. Breeze Angell NP-C    04/27/2020, 4:04 PM [facility] [address] Office [phone] Fax [phone]  Notice: This dictation was prepared with Dragon dictation along with smaller phrase technology. Any transcriptional errors that result from this process are unintentional and may not be corrected upon review.

## 2020-04-26 ENCOUNTER — Telehealth: Payer: Self-pay | Admitting: Family Medicine

## 2020-04-26 NOTE — Telephone Encounter (Signed)
Patient has an infected blister on his foot.  He has a history of Osteomylitis.  He has an appointment on Wednesday, however he is requesting to go ahead and get put on antibiotics due to his history.  He is requesting a call back today.    [facility] [address]

## 2020-04-26 NOTE — Telephone Encounter (Signed)
I will wait for the OV

## 2020-04-27 ENCOUNTER — Other Ambulatory Visit: Payer: Self-pay

## 2020-04-27 ENCOUNTER — Ambulatory Visit: Payer: No Typology Code available for payment source | Admitting: Family Medicine

## 2020-04-27 ENCOUNTER — Encounter: Payer: Self-pay | Admitting: Family Medicine

## 2020-04-27 VITALS — BP 140/70 | HR 58 | Temp 97.8°F | Wt 215.4 lb

## 2020-04-27 DIAGNOSIS — L03116 Cellulitis of left lower limb: Secondary | ICD-10-CM

## 2020-04-27 MED ORDER — CEFTRIAXONE SODIUM 1 G IJ SOLR
1.0000 g | Freq: Once | INTRAMUSCULAR | Status: AC
Start: 2020-04-27 — End: 2020-04-27
  Administered 2020-04-27: 1 g via INTRAMUSCULAR

## 2020-04-27 MED ORDER — CEPHALEXIN 500 MG PO CAPS
500.0000 mg | ORAL_CAPSULE | Freq: Four times a day (QID) | ORAL | 0 refills | Status: AC
Start: 2020-04-27 — End: 2020-05-07

## 2020-04-27 NOTE — Addendum Note (Signed)
Addended by: Solon Augusta on: 04/27/2020 09:05 AM   Modules accepted: Orders

## 2020-04-27 NOTE — Progress Notes (Signed)
   Subjective:    Patient ID: Robert Huang, male    DOB: 05-02-1961, 59 y.o.   MRN: [id number]  HPI Here for 3 days of an open wound on the left foot at the site of a previous cellulitis/osteomyelitis infection several years ago. This is mildly painful. No fever. He has had a callus on the bottom of the foot for month, and then a blister formed a week ago. Now the blister has opened and drained clear fluid. 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 He can work from home if needed.    Review of Systems  Constitutional: Negative.   Respiratory: Negative.   Cardiovascular: Negative.   Skin: Positive for wound.       Objective:   Physical Exam Constitutional:      Appearance: Normal appearance.  Cardiovascular:     Rate and Rhythm: Normal rate and regular rhythm.     Pulses: Normal pulses.     Heart sounds: Normal heart sounds.  Pulmonary:     Effort: Pulmonary effort is normal.     Breath sounds: Normal breath sounds.  Skin:    Comments: There is a callus on the bottom of the left foot over the 4th metatarsal head. There is also an open wound over the lateral portion of the foot. No erythema or tenderness  Neurological:     Mental Status: He is alert.           Assessment & Plan:  Early cellulitis. He is given a shot of Rocephin and he will begin 10 days of Keflex QID. He agreed to work from home 100% for the time being. We will refer him to the [facility] to begin dressings for the foot. Recheck in one week. Gershon Crane, MD

## 2020-04-28 ENCOUNTER — Ambulatory Visit: Payer: No Typology Code available for payment source | Admitting: General Practice

## 2020-05-27 ENCOUNTER — Other Ambulatory Visit: Payer: Self-pay

## 2020-05-27 ENCOUNTER — Encounter (HOSPITAL_BASED_OUTPATIENT_CLINIC_OR_DEPARTMENT_OTHER): Payer: No Typology Code available for payment source | Attending: Internal Medicine | Admitting: Internal Medicine

## 2020-05-27 DIAGNOSIS — I10 Essential (primary) hypertension: Secondary | ICD-10-CM | POA: Insufficient documentation

## 2020-05-27 DIAGNOSIS — Z89422 Acquired absence of other left toe(s): Secondary | ICD-10-CM | POA: Diagnosis not present

## 2020-05-27 DIAGNOSIS — E11621 Type 2 diabetes mellitus with foot ulcer: Secondary | ICD-10-CM | POA: Diagnosis not present

## 2020-05-27 DIAGNOSIS — E1140 Type 2 diabetes mellitus with diabetic neuropathy, unspecified: Secondary | ICD-10-CM | POA: Diagnosis not present

## 2020-05-27 DIAGNOSIS — Z955 Presence of coronary angioplasty implant and graft: Secondary | ICD-10-CM | POA: Insufficient documentation

## 2020-05-27 DIAGNOSIS — E1151 Type 2 diabetes mellitus with diabetic peripheral angiopathy without gangrene: Secondary | ICD-10-CM | POA: Insufficient documentation

## 2020-05-27 DIAGNOSIS — L97522 Non-pressure chronic ulcer of other part of left foot with fat layer exposed: Secondary | ICD-10-CM | POA: Insufficient documentation

## 2020-05-27 DIAGNOSIS — I2510 Atherosclerotic heart disease of native coronary artery without angina pectoris: Secondary | ICD-10-CM | POA: Insufficient documentation

## 2020-05-27 NOTE — Progress Notes (Signed)
SONNY, ANTHES T ([id number]) . Visit Report for 05/27/2020 Abuse/Suicide Risk Screen Details Patient Name: Date of Service: Robert Huang 05/27/2020 9:00 A M Medical Record Number: [id number] Patient Account Number: [id number] Date of Birth/Sex: Treating RN: 1961/01/28 (59 y.o. Robert Huang) Yevonne Pax Primary Care Cayson Kalb: [name] Other Clinician: Referring Carlosdaniel Grob: Treating Sadako Cegielski/Extender: Doran Stabler in Treatment: 0 Abuse/Suicide Risk Screen Items Answer ABUSE RISK SCREEN: Has anyone close to you tried to hurt or harm you recentlyo No Do you feel uncomfortable with anyone in your familyo No Has anyone forced you do things that you didnt want to doo No Electronic Signature(s) Signed: 05/27/2020 4:20:16 PM By: Yevonne Pax RN Entered By: Yevonne Pax on 05/27/2020 09:04:18 -------------------------------------------------------------------------------- Activities of Daily Living Details Patient Name: Date of Service: Robert Huang 05/27/2020 9:00 A M Medical Record Number: [id number] Patient Account Number: [id number] Date of Birth/Sex: Treating RN: [birth year] (59 y.o. Robert Huang) Yevonne Pax Primary Care Jwan Hornbaker: [name] Other Clinician: Referring Linzey Ramser: Treating Indiana Gamero/Extender: Doran Stabler in Treatment: 0 Activities of Daily Living Items Answer Activities of Daily Living (Please select one for each item) Drive Automobile Completely Able T Medications ake Completely Able Use T elephone Completely Able Care for Appearance Completely Able Use T oilet Completely Able Bath / Shower Completely Able Dress Self Completely Able Feed Self Completely Able Walk Completely Able Get In / Out Bed Completely Able Housework Completely Able Prepare Meals Completely Able Handle Money Completely Able Shop for Self Completely Able Electronic Signature(s) Signed: 05/27/2020 4:20:16 PM By: Yevonne Pax RN Entered  By: Yevonne Pax on 05/27/2020 09:04:54 -------------------------------------------------------------------------------- Education Screening Details Patient Name: Date of Service: Robert Huang, Robert Debar T. 05/27/2020 9:00 A M Medical Record Number: [id number] Patient Account Number: [id number] Date of Birth/Sex: Treating RN: 24-Apr-1961 (59 y.o. Robert Huang Primary Care Thessaly Mccullers: [name] Other Clinician: Referring Meztli Llanas: Treating Rucha Wissinger/Extender: Doran Stabler in Treatment: 0 Primary Learner Assessed: Patient Learning Preferences/Education Level/Primary Language Learning Preference: Explanation Highest Education Level: College or Above Preferred Language: English Cognitive Barrier Language Barrier: No Translator Needed: No Memory Deficit: No Emotional Barrier: No Cultural/Religious Beliefs Affecting Medical Care: No Physical Barrier Impaired Vision: Yes Glasses Impaired Hearing: No Decreased Hand dexterity: No Knowledge/Comprehension Knowledge Level: Medium Comprehension Level: High Ability to understand written instructions: High Ability to understand verbal instructions: High Motivation Anxiety Level: Calm Cooperation: Cooperative Education Importance: Acknowledges Need Interest in Health Problems: Asks Questions Perception: Coherent Willingness to Engage in Self-Management High Activities: Readiness to Engage in Self-Management High Activities: Electronic Signature(s) Signed: 05/27/2020 4:20:16 PM By: Yevonne Pax RN Entered By: Yevonne Pax on 05/27/2020 09:05:25 -------------------------------------------------------------------------------- Fall Risk Assessment Details Patient Name: Date of Service: Robert Huang, Robert Debar T. 05/27/2020 9:00 A M Medical Record Number: [id number] Patient Account Number: [id number] Date of Birth/Sex: Treating RN: 1961/07/10 (59 y.o. Robert Huang) Yevonne Pax Primary Care Tiffnay Bossi: [name] Other  Clinician: Referring Karlei Waldo: Treating Devery Odwyer/Extender: Doran Stabler in Treatment: 0 Fall Risk Assessment Items Have you had 2 or more falls in the last 12 monthso 0 No Have you had any fall that resulted in injury in the last 12 monthso 0 No FALLS RISK SCREEN History of falling - immediate or within 3 months 0 No Secondary diagnosis (Do you have 2 or more medical diagnoseso) 0 No Ambulatory aid None/bed rest/wheelchair/nurse 0 No Crutches/cane/walker 0 No Furniture 0 No  Intravenous therapy Access/Saline/Heparin Lock 0 No Gait/Transferring Normal/ bed rest/ wheelchair 0 No Weak (short steps with or without shuffle, stooped but able to lift head while walking, may seek 0 No support from furniture) Impaired (short steps with shuffle, may have difficulty arising from chair, head down, impaired 0 No balance) Mental Status Oriented to own ability 0 No Electronic Signature(s) Signed: 05/27/2020 4:20:16 PM By: Yevonne Pax RN Entered By: Yevonne Pax on 05/27/2020 09:05:56 -------------------------------------------------------------------------------- Foot Assessment Details Patient Name: Date of Service: Robert Huang, Robert Debar T. 05/27/2020 9:00 A M Medical Record Number: [id number] Patient Account Number: [id number] Date of Birth/Sex: Treating RN: Jan 02, 1961 (59 y.o. Robert Huang) Yevonne Pax Primary Care Nassim Cosma: [name] Other Clinician: Referring Mervin Ramires: Treating Janese Radabaugh/Extender: Doran Stabler in Treatment: 0 Foot Assessment Items Site Locations + = Sensation present, - = Sensation absent, C = Callus, U = Ulcer R = Redness, W = Warmth, M = Maceration, PU = Pre-ulcerative lesion F = Fissure, S = Swelling, D = Dryness Assessment Right: Left: Other Deformity: No No Prior Foot Ulcer: No No Prior Amputation: No No Charcot Joint: No No Ambulatory Status: Ambulatory Without Help Gait: Unsteady Electronic Signature(s) Signed:  05/27/2020 4:20:16 PM By: Yevonne Pax RN Entered By: Yevonne Pax on 05/27/2020 09:17:23 -------------------------------------------------------------------------------- Nutrition Risk Screening Details Patient Name: Date of Service: [name] 05/27/2020 9:00 A M Medical Record Number: [id number] Patient Account Number: [id number] Date of Birth/Sex: Treating RN: [birth year] (59 y.o. Robert Huang) Yevonne Pax Primary Care Jovon Streetman: [name] Other Clinician: Referring Dicie Edelen: Treating Gladies Sofranko/Extender: Doran Stabler in Treatment: 0 Height (in): 72 Weight (lbs): 212 Body Mass Index (BMI): 28.7 Nutrition Risk Screening Items Score Screening NUTRITION RISK SCREEN: I have an illness or condition that made me change the kind and/or amount of food I eat 0 No I eat fewer than two meals per day 0 No I eat few fruits and vegetables, or milk products 0 No I have three or more drinks of beer, liquor or wine almost every day 0 No I have tooth or mouth problems that make it hard for me to eat 0 No I don't always have enough money to buy the food I need 0 No I eat alone most of the time 0 No I take three or more different prescribed or over-the-counter drugs a day 1 Yes Without wanting to, I have lost or gained 10 pounds in the last six months 0 No I am not always physically able to shop, cook and/or feed myself 0 No Nutrition Protocols Good Risk Protocol 0 No interventions needed Moderate Risk Protocol High Risk Proctocol Risk Level: Good Risk Score: 1 Electronic Signature(s) Signed: 05/27/2020 4:20:16 PM By: Yevonne Pax RN Entered By: Yevonne Pax on 05/27/2020 09:06:48

## 2020-05-27 NOTE — Progress Notes (Signed)
ADAN, BAEHR T ([id number]) . Visit Report for 05/27/2020 Allergy List Details Patient Name: Date of Service: Robert Huang 05/27/2020 9:00 A M Medical Record Number: [id number] Patient Account Number: [id number] Date of Birth/Sex: Treating RN: 1961-04-11 (59 y.o. Judie Petit) Yevonne Pax Primary Care Kristine Tiley: [name] Other Clinician: Referring Scout Gumbs: Treating Matheau Orona/Extender: Doran Stabler in Treatment: 0 Allergies Active Allergies No Known Drug Allergies Allergy Notes Electronic Signature(s) Signed: 05/27/2020 4:20:16 PM By: Yevonne Pax RN Entered By: Yevonne Pax on 05/27/2020 08:58:35 -------------------------------------------------------------------------------- Arrival Information Details Patient Name: Date of Service: [name], [name] 05/27/2020 9:00 A M Medical Record Number: [id number] Patient Account Number: [id number] Date of Birth/Sex: Treating RN: 1961-01-09 (59 y.o. Melonie [place] Primary Care Nicholle Falzon: [name] Other Clinician: Referring Haelee Bolen: Treating Bibi Economos/Extender: Doran Stabler in Treatment: 0 Visit Information Patient Arrived: Ambulatory Arrival Time: 08:45 Accompanied By: self Transfer Assistance: None Patient Identification Verified: Yes Secondary Verification Process Completed: Yes Patient Requires Transmission-Based Precautions: No Patient Has Alerts: No Electronic Signature(s) Signed: 05/27/2020 4:20:16 PM By: Yevonne Pax RN Entered By: Yevonne Pax on 05/27/2020 08:56:57 -------------------------------------------------------------------------------- Clinic Level of Care Assessment Details Patient Name: Date of Service: Robert Huang. 05/27/2020 9:00 A M Medical Record Number: [id number] Patient Account Number: [id number] Date of Birth/Sex: Treating RN: 1961/03/18 (59 y.o. Katherina Right Primary Care Fonnie Crookshanks: [name] Other Clinician: Referring  Reynalda Canny: Treating Archer Vise/Extender: Doran Stabler in Treatment: 0 Clinic Level of Care Assessment Items TOOL 1 Quantity Score X- 1 0 Use when EandM and Procedure is performed on INITIAL visit ASSESSMENTS - Nursing Assessment / Reassessment X- 1 20 General Physical Exam (combine w/ comprehensive assessment (listed just below) when performed on new pt. evals) X- 1 25 Comprehensive Assessment (HX, ROS, Risk Assessments, Wounds Hx, etc.) ASSESSMENTS - Wound and Skin Assessment / Reassessment []  - 0 Dermatologic / Skin Assessment (not related to wound area) ASSESSMENTS - Ostomy and/or Continence Assessment and Care []  - 0 Incontinence Assessment and Management []  - 0 Ostomy Care Assessment and Management (repouching, etc.) PROCESS - Coordination of Care X - Simple Patient / Family Education for ongoing care 1 15 []  - 0 Complex (extensive) Patient / Family Education for ongoing care X- 1 10 Staff obtains , Records, T Results / Process Orders est []  - 0 Staff telephones HHA, Nursing Homes / Clarify orders / etc []  - 0 Routine Transfer to another Facility (non-emergent condition) []  - 0 Routine Hospital Admission (non-emergent condition) X- 1 15 New Admissions / / Ordering NPWT Apligraf, etc. , []  - 0 Emergency Hospital Admission (emergent condition) PROCESS - Special Needs []  - 0 Pediatric / Minor Patient Management []  - 0 Isolation Patient Management []  - 0 Hearing / Language / Visual special needs []  - 0 Assessment of Community assistance (transportation, D/C planning, etc.) []  - 0 Additional assistance / Altered mentation []  - 0 Support Surface(s) Assessment (bed, cushion, seat, etc.) INTERVENTIONS - Miscellaneous []  - 0 External ear exam []  - 0 Patient Transfer (multiple staff / / Similar devices) []  - 0 Simple Staple / Suture removal (25 or less) []  - 0 Complex Staple / Suture removal (26 or  more) []  - 0 Hypo/Hyperglycemic Management (do not check if billed separately) X- 1 15 Ankle / Brachial Index (ABI) - do not check if billed separately Has the patient been seen at the hospital  within the last three years: Yes Total Score: 100 Level Of Care: New/Established - Level 3 Electronic Signature(s) Signed: 05/27/2020 4:10:39 PM By: Cherylin Mylarwiggins, Shannon Entered By: Cherylin Mylarwiggins, Shannon on 05/27/2020 09:59:49 -------------------------------------------------------------------------------- Encounter Discharge Information Details Patient Name: Date of Service: Kathlyn SacramentoGRINDSTA FF, Donnal DebarA LA N T. 05/27/2020 9:00 A M Medical Record Number: [id number] Patient Account Number: [id number] Date of Birth/Sex: Treating RN: March 05, 1961 (59 y.o. Harlon FlorM) Deaton, Yvonne KendallBobbi Primary Care Ilario Dhaliwal: Nelwyn SalisburyFry, Stephen A Other Clinician: Referring Kaitlan Bin: Treating Ukiah Trawick/Extender: Doran Stablerobson, Michael Fry, Stephen A Weeks in Treatment: 0 Encounter Discharge Information Items Post Procedure Vitals Discharge Condition: Stable Temperature (F): 98.4 Ambulatory Status: Ambulatory Pulse (bpm): 65 Discharge Destination: Home Respiratory Rate (breaths/min): 18 Transportation: Private Auto Blood Pressure (mmHg): 124/75 Accompanied By: self Schedule Follow-up Appointment: Yes Clinical Summary of Care: Electronic Signature(s) Signed: 05/27/2020 4:18:11 PM By: Shawn Stalleaton, Bobbi Entered By: Shawn Stalleaton, Bobbi on 05/27/2020 10:41:59 -------------------------------------------------------------------------------- Lower Extremity Assessment Details Patient Name: Date of Service: Robert GoingGRINDSTA FF, [name] 05/27/2020 9:00 A M Medical Record Number: [id number] Patient Account Number: [id number] Date of Birth/Sex: Treating RN: March 05, 1961 (59 y.o. Melonie FloridaM) Epps, Carrie Primary Care Roselynn Whitacre: Nelwyn SalisburyFry, Stephen A Other Clinician: Referring Venise Ellingwood: Treating Lyndee Herbst/Extender: Doran Stablerobson, Michael Fry, Stephen A Weeks in Treatment: 0 Edema Assessment Assessed:  Kyra Searles[Left: No] [Right: No] E[Left: dema] [Right: :] Calf Left: Right: Point of Measurement: 43 cm From Medial Instep 36 cm Ankle Left: Right: Point of Measurement: 10 cm From Medial Instep 22 cm Vascular Assessment Blood Pressure: Brachial: [Left:124] Ankle: [Left:Dorsalis Pedis: 152 1.23] Electronic Signature(s) Signed: 05/27/2020 4:20:16 PM By: Yevonne PaxEpps, Carrie RN Entered By: Yevonne PaxEpps, Carrie on 05/27/2020 09:24:04 -------------------------------------------------------------------------------- Multi Wound Chart Details Patient Name: Date of Service: Kathlyn SacramentoGRINDSTA FF, Donnal DebarA LA N T. 05/27/2020 9:00 A M Medical Record Number: [id number] Patient Account Number: [id number] Date of Birth/Sex: Treating RN: March 05, 1961 (59 y.o. Katherina RightM) Dwiggins, Shannon Primary Care Cullen Vanallen: Nelwyn SalisburyFry, Stephen A Other Clinician: Referring Alfonza Toft: Treating Kairi Harshbarger/Extender: Doran Stablerobson, Michael Fry, Stephen A Weeks in Treatment: 0 Vital Signs Height(in): 72 Pulse(bpm): 65 Weight(lbs): 212 Blood Pressure(mmHg): 124/75 Body Mass Index(BMI): 29 Temperature(F): 98.4 Respiratory Rate(breaths/min): 18 Photos: [1:No Photos Left T Great oe] [2:No Photos Left Metatarsal head fifth] [N/A:N/A N/A] Wound Location: [1:Gradually Appeared] [2:Gradually Appeared] [N/A:N/A] Wounding Event: [1:Diabetic Wound/Ulcer of the Lower] [2:Diabetic Wound/Ulcer of the Lower] [N/A:N/A] Primary Etiology: [1:Extremity Coronary Artery Disease,] [2:Extremity Coronary Artery Disease,] [N/A:N/A] Comorbid History: [1:Hypertension, Type II Diabetes 05/20/2020] [2:Hypertension, Type II Diabetes 04/27/2020] [N/A:N/A] Date Acquired: [1:0] [2:0] [N/A:N/A] Weeks of Treatment: [1:Open] [2:Open] [N/A:N/A] Wound Status: [1:0.3x0.7x0.1] [2:0.1x0.4x0.1] [N/A:N/A] Measurements L x W x D (cm) [1:0.165] [2:0.031] [N/A:N/A] A (cm) : rea [1:0.016] [2:0.003] [N/A:N/A] Volume (cm) : [1:Grade 2] [2:Grade 2] [N/A:N/A] Classification: [1:Medium] [2:Small] [N/A:N/A] Exudate A  mount: [1:Serosanguineous] [2:Serosanguineous] [N/A:N/A] Exudate Type: [1:red, brown] [2:red, brown] [N/A:N/A] Exudate Color: [1:Medium (34-66%)] [2:Medium (34-66%)] [N/A:N/A] Granulation A mount: [1:Pink] [2:Red, Pink] [N/A:N/A] Granulation Quality: [1:Medium (34-66%)] [2:Medium (34-66%)] [N/A:N/A] Necrotic A mount: [1:Fat Layer (Subcutaneous Tissue): Yes Fat Layer (Subcutaneous Tissue): Yes N/A] Exposed Structures: [1:Fascia: No Tendon: No Muscle: No Joint: No Bone: No None] [2:Fascia: No Tendon: No Muscle: No Joint: No Bone: No None] [N/A:N/A] Epithelialization: [1:Chemical/Enzymatic/Mechanical] [2:Debridement - Excisional] [N/A:N/A] Debridement: Pre-procedure Verification/Time Out N/A [2:09:51] [N/A:N/A] Taken: [1:N/A] [2:Other] [N/A:N/A] Pain Control: [1:N/A] [2:Callus, Subcutaneous] [N/A:N/A] Tissue Debrided: [1:N/A] [2:Skin/Subcutaneous Tissue] [N/A:N/A] Level: [1:N/A] [2:1] [N/A:N/A] Debridement A (sq cm): [1:rea N/A] [2:Curette] [N/A:N/A] Instrument: [1:None] [2:Minimum] [N/A:N/A] Bleeding: [1:N/A] [2:Pressure] [N/A:N/A] Hemostasis A chieved: [1:N/A] [2:0] [N/A:N/A] Procedural Pain: [1:N/A] [2:0] [N/A:N/A] Post Procedural Pain: [1:Procedure  was tolerated well] [2:Procedure was tolerated well] [N/A:N/A] Debridement Treatment Response: [1:0.3x0.7x0.1] [2:0.1x0.4x0.4] [N/A:N/A] Post Debridement Measurements L x W x D (cm) [1:0.016] [2:0.013] [N/A:N/A] Post Debridement Volume: (cm) [1:N/A] [2:Debridement] [N/A:N/A] Treatment Notes Electronic Signature(s) Signed: 05/27/2020 4:10:39 PM By: Cherylin Mylar Signed: 05/27/2020 4:24:19 PM By: Baltazar Najjar MD Entered By: Baltazar Najjar on 05/27/2020 10:22:50 -------------------------------------------------------------------------------- Multi-Disciplinary Care Plan Details Patient Name: Date of Service: [name], [name] 05/27/2020 9:00 A M Medical Record Number: [id number] Patient Account Number: [id number] Date of  Birth/Sex: Treating RN: [birth year] (59 y.o. Katherina Right Primary Care Lind Ausley: [name] Other Clinician: Referring Rahi Chandonnet: Treating Ryland Tungate/Extender: Doran Stabler in Treatment: 0 Active Inactive Nutrition Nursing Diagnoses: Impaired glucose control: actual or potential Goals: Patient/caregiver verbalizes understanding of need to maintain therapeutic glucose control per primary care physician Date Initiated: 05/27/2020 Target Resolution Date: 06/27/2020 Goal Status: Active Interventions: Provide education on elevated blood sugars and impact on wound healing Notes: Orientation to the Wound Care Program Nursing Diagnoses: Knowledge deficit related to the wound healing center program Goals: Patient/caregiver will verbalize understanding of the Wound Healing Center Program Date Initiated: 05/27/2020 Target Resolution Date: 06/27/2020 Goal Status: Active Interventions: Provide education on orientation to the wound center Notes: Wound/Skin Impairment Nursing Diagnoses: Impaired tissue integrity Goals: Ulcer/skin breakdown will have a volume reduction of 50% by week 8 Date Initiated: 05/27/2020 Target Resolution Date: 06/27/2020 Goal Status: Active Interventions: Provide education on ulcer and skin care Notes: Electronic Signature(s) Signed: 05/27/2020 4:10:39 PM By: Cherylin Mylar Entered By: Cherylin Mylar on 05/27/2020 09:39:45 -------------------------------------------------------------------------------- Pain Assessment Details Patient Name: Date of Service: [name], [name] 05/27/2020 9:00 A M Medical Record Number: [id number] Patient Account Number: [id number] Date of Birth/Sex: Treating RN: 12-Jan-1961 (59 y.o. Melonie [place] Primary Care Marty Sadlowski: [name] Other Clinician: Referring Yocelyn Brocious: Treating Aydin Cavalieri/Extender: Doran Stabler in Treatment: 0 Active Problems Location of Pain  Severity and Description of Pain Patient Has Paino No Site Locations Pain Management and Medication Current Pain Management: Electronic Signature(s) Signed: 05/27/2020 4:20:16 PM By: Yevonne Pax RN Entered By: Yevonne Pax on 05/27/2020 09:32:56 -------------------------------------------------------------------------------- Patient/Caregiver Education Details Patient Name: Date of Service: Robert Huang 10/1/2021andnbsp9:00 A M Medical Record Number: [id number] Patient Account Number: [id number] Date of Birth/Gender: Treating RN: 12-21-1960 (59 y.o. Katherina Right Primary Care Physician: [name] Other Clinician: Referring Physician: Treating Physician/Extender: Doran Stabler in Treatment: 0 Education Assessment Education Provided To: Patient Education Topics Provided Elevated Blood Sugar/ Impact on Healing: Handouts: Elevated Blood Sugars: How Do They Affect Wound Healing Methods: Explain/Verbal Responses: State content correctly Welcome T [facility]: o Handouts: Welcome T [facility] o Methods: Explain/Verbal Responses: State content correctly Wound/Skin Impairment: Handouts: Caring for Your Ulcer Methods: Explain/Verbal Responses: State content correctly Electronic Signature(s) Signed: 05/27/2020 4:10:39 PM By: Cherylin Mylar Entered By: Cherylin Mylar on 05/27/2020 09:40:02 -------------------------------------------------------------------------------- Wound Assessment Details Patient Name: Date of Service: [name], [name] 05/27/2020 9:00 A M Medical Record Number: [id number] Patient Account Number: [id number] Date of Birth/Sex: Treating RN: Apr 06, 1961 (59 y.o. Melonie [place] Primary Care Radhika Dershem: [name] Other Clinician: Referring Heberto Sturdevant: Treating Roddy Bellamy/Extender: Doran Stabler in Treatment: 0 Wound Status Wound Number: 1 Primary Etiology:  Diabetic Wound/Ulcer of the Lower Extremity Wound Location: Left T Great oe Wound Status: Open Wounding Event: Gradually Appeared Comorbid History: Coronary  Artery Disease, Hypertension, Type II Diabetes Date Acquired: 05/20/2020 Weeks Of Treatment: 0 Clustered Wound: No Wound Measurements Length: (cm) 0.3 Width: (cm) 0.7 Depth: (cm) 0.1 Area: (cm) 0.165 Volume: (cm) 0.016 % Reduction in Area: % Reduction in Volume: Epithelialization: None Tunneling: No Undermining: No Wound Description Classification: Grade 2 Exudate Amount: Medium Exudate Type: Serosanguineous Exudate Color: red, brown Foul Odor After Cleansing: No Slough/Fibrino Yes Wound Bed Granulation Amount: Medium (34-66%) Exposed Structure Granulation Quality: Pink Fascia Exposed: No Necrotic Amount: Medium (34-66%) Fat Layer (Subcutaneous Tissue) Exposed: Yes Necrotic Quality: Adherent Slough Tendon Exposed: No Muscle Exposed: No Joint Exposed: No Bone Exposed: No Treatment Notes Wound #1 (Left Toe Great) 1. Cleanse With Wound Cleanser 3. Primary Dressing Applied Calcium Alginate Ag 4. Secondary Dressing Dry Gauze Foam 5. Secured With Medipore tape 7. Footwear/Offloading device applied Wedge shoe Notes foam donut as secondary. discussed at length how to apply dressings, caring for wound, walking with wedge shoe, and when to return to wound center. Electronic Signature(s) Signed: 05/27/2020 4:20:16 PM By: Yevonne Pax RN Entered By: Yevonne Pax on 05/27/2020 09:28:21 -------------------------------------------------------------------------------- Wound Assessment Details Patient Name: Date of Service: [name], [name] 05/27/2020 9:00 A M Medical Record Number: [id number] Patient Account Number: [id number] Date of Birth/Sex: Treating RN: 1961/04/23 (59 y.o. Katherina Right Primary Care Denney Shein: [name] Other Clinician: Referring Yesly Gerety: Treating Antoria Lanza/Extender: Doran Stabler in Treatment: 0 Wound Status Wound Number: 2 Primary Etiology: Diabetic Wound/Ulcer of the Lower Extremity Wound Location: Left Metatarsal head fourth Wound Status: Open Wounding Event: Gradually Appeared Comorbid History: Coronary Artery Disease, Hypertension, Type II Diabetes Date Acquired: 04/27/2020 Weeks Of Treatment: 0 Clustered Wound: No Wound Measurements Length: (cm) 0.1 Width: (cm) 0.4 Depth: (cm) 0.1 Area: (cm) 0.031 Volume: (cm) 0.003 % Reduction in Area: 0% % Reduction in Volume: 0% Epithelialization: None Tunneling: No Undermining: No Wound Description Classification: Grade 2 Exudate Amount: Medium Exudate Type: Serosanguineous Exudate Color: red, brown Foul Odor After Cleansing: No Slough/Fibrino Yes Wound Bed Granulation Amount: Medium (34-66%) Exposed Structure Granulation Quality: Red, Pink Fascia Exposed: No Necrotic Amount: Medium (34-66%) Fat Layer (Subcutaneous Tissue) Exposed: Yes Necrotic Quality: Adherent Slough Tendon Exposed: No Muscle Exposed: No Joint Exposed: No Bone Exposed: No Treatment Notes Wound #2 (Left Metatarsal head fourth) 1. Cleanse With Wound Cleanser 3. Primary Dressing Applied Calcium Alginate Ag 4. Secondary Dressing Dry Gauze Roll Gauze 5. Secured With Medipore tape 7. Footwear/Offloading device applied Felt/Foam Wedge shoe Notes explained how supplies will arrive at their home. patient in agreement. Electronic Signature(s) Signed: 05/27/2020 4:10:39 PM By: Cherylin Mylar Entered By: Cherylin Mylar on 05/27/2020 10:28:44 -------------------------------------------------------------------------------- Vitals Details Patient Name: Date of Service: [name], [name] 05/27/2020 9:00 A M Medical Record Number: [id number] Patient Account Number: [id number] Date of Birth/Sex: Treating RN: 21-Mar-1961 (59 y.o. Judie Petit) Yevonne Pax Primary Care Jhayla Podgorski: [name] Other  Clinician: Referring Stephana Morell: Treating Dalton Molesworth/Extender: Doran Stabler in Treatment: 0 Vital Signs Time Taken: 08:57 Temperature (F): 98.4 Height (in): 72 Pulse (bpm): 65 Source: Stated Respiratory Rate (breaths/min): 18 Weight (lbs): 212 Blood Pressure (mmHg): 124/75 Source: Stated Reference Range: 80 - 120 mg / dl Body Mass Index (BMI): 28.7 Electronic Signature(s) Signed: 05/27/2020 4:20:16 PM By: Yevonne Pax RN Entered By: Yevonne Pax on 05/27/2020 08:57:32

## 2020-05-27 NOTE — Progress Notes (Signed)
Robert Huang, Robert Huang ([id number]) . Visit Report for 05/27/2020 Debridement Details Patient Name: Date of Service: Robert Huang 05/27/2020 9:00 A M Medical Record Number: [id number] Patient Account Number: [id number] Date of Birth/Sex: Treating RN: [birth year] (59 y.o. [name] Primary Care Provider: [name] Other Clinician: Referring Provider: Treating Provider/Extender: [name] in Treatment: 0 Debridement Performed for Assessment: Wound #2 Left Metatarsal head fourth Performed By: Physician Ricard Dillon., MD Debridement Type: Debridement Severity of Tissue Pre Debridement: Fat layer exposed Level of Consciousness (Pre-procedure): Awake and Alert Pre-procedure Verification/Time Out Yes - 09:51 Taken: Start Time: 09:51 Pain Control: Other : benzocaine, 4% Huang Area Debrided (L x W): otal 1 (cm) x 1 (cm) = 1 (cm) Tissue and other material debrided: Viable, Non-Viable, Callus, Subcutaneous Level: Skin/Subcutaneous Tissue Debridement Description: Excisional Instrument: Curette Bleeding: Minimum Hemostasis Achieved: Pressure End Time: 09:51 Procedural Pain: 0 Post Procedural Pain: 0 Response to Treatment: Procedure was tolerated well Level of Consciousness (Post- Awake and Alert procedure): Post Debridement Measurements of Total Wound Length: (cm) 0.1 Width: (cm) 0.4 Depth: (cm) 0.4 Volume: (cm) 0.013 Character of Wound/Ulcer Post Debridement: Improved Severity of Tissue Post Debridement: Fat layer exposed Post Procedure Diagnosis Same as Pre-procedure Electronic Signature(s) Signed: 05/27/2020 4:10:39 PM By: Kela Millin Signed: 05/27/2020 4:24:19 PM By: Linton Ham MD Entered By: Linton Ham on 05/27/2020 10:26:19 -------------------------------------------------------------------------------- Debridement Details Patient Name: Date of Service: Robert Huang, Robert Arbour Huang. 05/27/2020 9:00 A M Medical Record  Number: [id number] Patient Account Number: [id number] Date of Birth/Sex: Treating RN: 08-Jul-1961 (59 y.o. [name] Primary Care Provider: [name] Other Clinician: Referring Provider: Treating Provider/Extender: [name] in Treatment: 0 Debridement Performed for Assessment: Wound #1 Left Huang Great oe Performed By: Physician Ricard Dillon., MD Debridement Type: Chemical/Enzymatic/Mechanical Agent Used: anacept and gauze Severity of Tissue Pre Debridement: Fat layer exposed Level of Consciousness (Pre-procedure): Awake and Alert Pre-procedure Verification/Time Out No Taken: Bleeding: None Response to Treatment: Procedure was tolerated well Level of Consciousness (Post- Awake and Alert procedure): Post Debridement Measurements of Total Wound Length: (cm) 0.3 Width: (cm) 0.7 Depth: (cm) 0.1 Volume: (cm) 0.016 Character of Wound/Ulcer Post Debridement: Improved Severity of Tissue Post Debridement: Fat layer exposed Post Procedure Diagnosis Same as Pre-procedure Electronic Signature(s) Signed: 05/27/2020 4:10:39 PM By: Kela Millin Signed: 05/27/2020 4:24:19 PM By: Linton Ham MD Entered By: Kela Millin on 05/27/2020 10:29:35 -------------------------------------------------------------------------------- HPI Details Patient Name: Date of Service: Robert Huang, Robert Arbour Huang. 05/27/2020 9:00 A M Medical Record Number: [id number] Patient Account Number: [id number] Date of Birth/Sex: Treating RN: [birth year] (59 y.o. [name] Primary Care Provider: [name] Other Clinician: Referring Provider: Treating Provider/Extender: [name] in Treatment: 0 History of Present Illness HPI Description: ADMISSION 05/27/2020 This is a 59 year old man who has type 2 diabetes who is here for wounds on his left foot in 2 locations. The patient has had problems with foot ulcers requiring surgery  including a right great toe amputation and a left fifth ray amputation in July 2020 for underlying osteomyelitis although strangely I cannot see any pathology or cultures done in this area. He states that the area on his left fourth met head plantar aspect opened about a month ago. He has had continuous callus in this area. He saw Dr. Sarajane Jews his primary doctor at the beginning of September and was felt to have a cellulitis in  the lateral part of his foot in this area he was given a shot of Rocephin and then ongoing antibiotics with Keflex for 10 days. It would appear that he was supposed to follow-up with Dr. Sarajane Jews although I do not see these notes. About a week and a half ago he stubbed his left first toe and has a wound on this area. He is using Neosporin to both wound areas. 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 Sarajane Jews ABI in our clinic was 1.23 on the left Electronic Signature(s) Signed: 05/27/2020 4:24:19 PM By: Linton Ham MD Entered By: Linton Ham on 05/27/2020 10:29:33 -------------------------------------------------------------------------------- Physical Exam Details Patient Name: Date of Service: Robert Huang, Robert Arbour Huang. 05/27/2020 9:00 A M Medical Record Number: [id number] Patient Account Number: [id number] Date of Birth/Sex: Treating RN: March 06, 1961 (59 y.o. [name] Primary Care Provider: [name] Other Clinician: Referring Provider: Treating Provider/Extender: [name] in Treatment: 0 Constitutional Sitting or standing Blood Pressure is within target range for patient.. Pulse regular and within target range for patient.[name] Respirations  regular, non-labored and within target range.. Temperature is normal and within the target range for the patient.[name] Appears in no distress. Respiratory work of breathing is normal. Cardiovascular Pedal pulses are palpable on the left both dorsalis pedis and posterior tibial. Musculoskeletal He has had a right first toe amputation and a left fifth ray amputation. Neurological Completely insensate to vibration. 1 out of 10 monofilament. Notes Wound exam; The patient's major area is over the left fourth metatarsal head. He has thick callus that had split in the center. I used a #5 curette to remove this and nonviable subcutaneous tissue. Underneath he has a wound with minimal depth. The tissue is healthy red and bleeding. I saw no evidence of any infection. This does not probe deeply. No surrounding erythema. Hemostasis with a pressure dressing On the tip of his left first toe is a very superficial wound that looks like it is on his way to fully epithelialized Electronic Signature(s) Signed: 05/27/2020 4:24:19 PM By: Linton Ham MD Entered By: Linton Ham on 05/27/2020 10:31:54 -------------------------------------------------------------------------------- Physician Orders Details Patient Name: Date of Service: Robert Huang, Robert Arbour Huang. 05/27/2020 9:00 A M Medical Record Number: [id number] Patient Account Number: [id number] Date of Birth/Sex: Treating RN: 1961/07/15 (59 y.o. [name] Primary Care Provider: [name] Other Clinician: Referring Provider: Treating Provider/Extender: [name] in Treatment: 0 Verbal / Phone Orders: No Diagnosis Coding Follow-up Appointments Return Appointment in 1 week. Dressing Change Frequency Change dressing every day. Wound Cleansing May shower and wash wound with soap and water. - on days that dressing is changed Primary Wound Dressing Wound #1 Left Huang Great oe Calcium Alginate with Silver Wound #2  Left Metatarsal head fourth Calcium Alginate with Silver Secondary Dressing Wound #1 Left Huang Great oe Foam - foam donut Kerlix/Rolled Gauze Wound #2 Left Metatarsal head fourth Kerlix/Rolled Gauze - felt Dry Gauze Off-Loading Other: - front offloader Consults Podiatry - [facility] Electronic Signature(s) Signed: 05/27/2020 4:10:39 PM By: Kela Millin Signed: 05/27/2020 4:24:19 PM By: Linton Ham MD Entered By: Kela Millin on 05/27/2020 10:02:57 Prescription  05/27/2020 -------------------------------------------------------------------------------- Camillo Flaming MD Patient Name: Provider: [birth year] [id number] Date of Birth: NPI#: [id number] Sex: DEA #: [phone] [id number] Phone #: License #: [place] Patient Address: [address] [place], [place] [address] [place], [place] [address] [phone] Allergies No Known Drug Allergies Provider's Orders Podiatry - [facility] Hand Signature: Date(s): Electronic Signature(s) Signed: 05/27/2020 4:10:39 PM By: Kela Millin Signed: 05/27/2020 4:24:19 PM By: Linton Ham MD Entered By: Kela Millin on 05/27/2020 10:02:57 -------------------------------------------------------------------------------- Problem List Details Patient Name: Date of Service: Robert Huang, Robert Arbour Huang. 05/27/2020 9:00 A M Medical Record Number: [id number] Patient Account Number: [id number] Date of Birth/Sex: Treating RN: Mar 24, 1961 (59 y.o. [name] Primary Care Provider: [name] Other Clinician: Referring Provider: Treating Provider/Extender: [name] in Treatment: 0 Active Problems ICD-10 Encounter Code Description Active Date MDM Diagnosis E11.621 Type 2 diabetes mellitus with foot ulcer 05/27/2020 No Yes L97.522 Non-pressure chronic ulcer of other part of left  foot with fat layer exposed 05/27/2020 No Yes L97.521 Non-pressure chronic ulcer of other part of left foot limited to breakdown of 05/27/2020 No Yes skin E11.40 Type 2 diabetes mellitus with diabetic neuropathy, unspecified 05/27/2020 No Yes Inactive Problems Resolved Problems Electronic Signature(s) Signed: 05/27/2020 4:24:19 PM By: Linton Ham MD Entered By: Linton Ham on 05/27/2020 10:07:53 -------------------------------------------------------------------------------- Progress Note Details Patient Name: Date of Service: Robert Huang, Robert Arbour Huang. 05/27/2020 9:00 A M Medical Record Number: [id number] Patient Account Number: [id number] Date of Birth/Sex: Treating RN: 29-Jul-1961 (59 y.o. [name] Primary Care Provider: [name] Other Clinician: Referring Provider: Treating Provider/Extender: [name] in Treatment: 0 Subjective History of Present Illness (HPI) ADMISSION 05/27/2020 This is a 59 year old man who has type 2 diabetes who is here for wounds on his left foot in 2 locations. The patient has had problems with foot ulcers requiring surgery including a right great toe amputation and a left fifth ray amputation in July 2020 for underlying osteomyelitis although strangely I cannot see any pathology or cultures done in this area. He states that the area on his left fourth met head plantar aspect opened about a month ago. He has had continuous callus in this area. He saw Dr. Sarajane Jews his primary doctor at the beginning of September and was felt to have a cellulitis in the lateral part of his foot in this area he was given a shot of Rocephin and then ongoing antibiotics with Keflex for 10 days. It would appear that he was supposed to follow-up with Dr. Sarajane Jews although I do not see these notes. About a week and a half ago he stubbed his left first toe and has a wound on this area. He is using Neosporin to both wound areas. 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 He is already been called out of work for a month to work from home because a to offload the foot by Dr. Sarajane Jews ABI in our clinic was 1.23 on the left Patient History Information obtained from Patient. Allergies No Known Drug Allergies Family History Cancer - Mother,Father, Diabetes - Father,Paternal Grandparents,Child, Heart Disease - Mother,Maternal Grandparents,Father,Siblings, Hypertension - Maternal Grandparents,Paternal Grandparents,Mother,Father,Siblings, Lung Disease - Father, Thyroid Problems - Mother, No family  history of Hereditary Spherocytosis, Kidney Disease, Seizures, Stroke, Tuberculosis. Social History Never smoker, Marital Status - Married, Alcohol Use - Moderate, Drug Use - No History, Caffeine Use - Daily. Medical History Eyes Denies history of Cataracts, Glaucoma, Optic Neuritis Ear/Nose/Mouth/Throat Denies history of Chronic sinus problems/congestion Hematologic/Lymphatic Denies history of Anemia, Hemophilia, Human Immunodeficiency Virus, Lymphedema, Sickle Cell Disease Respiratory Denies history of Aspiration, Asthma, Chronic Obstructive Pulmonary Disease (COPD), Pneumothorax, Sleep Apnea, Tuberculosis Cardiovascular Patient has history of Coronary Artery Disease, Hypertension Denies history of Angina, Arrhythmia, Congestive Heart Failure, Deep Vein Thrombosis, Hypotension, Myocardial Infarction, Peripheral Arterial Disease, Peripheral Venous Disease, Phlebitis, Vasculitis Gastrointestinal Denies history of Cirrhosis , Colitis, Crohnoos, Hepatitis A, Hepatitis B, Hepatitis C Endocrine Patient has history of Type II Diabetes Denies history of Type I Diabetes Immunological Denies history of Lupus Erythematosus, Raynaudoos, Scleroderma Integumentary  (Skin) Denies history of History of Burn Musculoskeletal Denies history of Gout, Rheumatoid Arthritis, Osteoarthritis, Osteomyelitis Neurologic Denies history of Dementia, Neuropathy, Quadriplegia, Paraplegia, Seizure Disorder Oncologic Denies history of Received Chemotherapy, Received Radiation Psychiatric Denies history of Anorexia/bulimia, Confinement Anxiety Patient is treated with Oral Agents. Blood sugar is not tested. Review of Systems (ROS) Constitutional Symptoms (General Health) Denies complaints or symptoms of Fatigue, Fever, Chills, Marked Weight Change. Eyes Complains or has symptoms of Glasses / Contacts. Denies complaints or symptoms of Dry Eyes, Vision Changes. Ear/Nose/Mouth/Throat Denies complaints or symptoms of Chronic sinus problems or rhinitis. Respiratory Denies complaints or symptoms of Chronic or frequent coughs, Shortness of Breath. Cardiovascular Denies complaints or symptoms of Chest pain. Gastrointestinal Denies complaints or symptoms of Frequent diarrhea, Nausea, Vomiting. Endocrine Denies complaints or symptoms of Heat/cold intolerance. Genitourinary Denies complaints or symptoms of Frequent urination. Integumentary (Skin) Complains or has symptoms of Wounds. Musculoskeletal Denies complaints or symptoms of Muscle Pain, Muscle Weakness. Neurologic Denies complaints or symptoms of Numbness/parasthesias. Psychiatric Denies complaints or symptoms of Claustrophobia, Suicidal. Objective Constitutional Sitting or standing Blood Pressure is within target range for patient.. Pulse regular and within target range for patient.[name] Respirations regular, non-labored and within target range.. Temperature is normal and within the target range for the patient.[name] Appears in no distress. Vitals Time Taken: 8:57 AM, Height: 72 in, Source: Stated, Weight: 212 lbs, Source: Stated, BMI: 28.7, Temperature: 98.4 F, Pulse: 65 bpm, Respiratory Rate: 18 breaths/min, Blood  Pressure: 124/75 mmHg. Respiratory work of breathing is normal. Cardiovascular Pedal pulses are palpable on the left both dorsalis pedis and posterior tibial. Musculoskeletal He has had a right first toe amputation and a left fifth ray amputation. Neurological Completely insensate to vibration. 1 out of 10 monofilament. General Notes: Wound exam; ooThe patient's major area is over the left fourth metatarsal head. He has thick callus that had split in the center. I used a #5 curette to remove this and nonviable subcutaneous tissue. Underneath he has a wound with minimal depth. The tissue is healthy red and bleeding. I saw no evidence of any infection. This does not probe deeply. No surrounding erythema. Hemostasis with a pressure dressing ooOn the tip of his left first toe is a very superficial wound that looks like it is on his way to fully epithelialized Integumentary (Hair, Skin) Wound #1 status is Open. Original cause of wound was Gradually Appeared. The wound is located on the Left Huang Great. The wound measures 0.3cm length x oe 0.7cm width x 0.1cm depth; 0.165cm^2 area and 0.016cm^3 volume. There is Fat Layer (Subcutaneous Tissue) exposed. There is no tunneling or undermining noted. There is a medium amount  of serosanguineous drainage noted. There is medium (34-66%) pink granulation within the wound bed. There is a medium (34- 66%) amount of necrotic tissue within the wound bed including Adherent Slough. Wound #2 status is Open. Original cause of wound was Gradually Appeared. The wound is located on the Left Metatarsal head fourth. The wound measures 0.1cm length x 0.4cm width x 0.1cm depth; 0.031cm^2 area and 0.003cm^3 volume. There is Fat Layer (Subcutaneous Tissue) exposed. There is no tunneling or undermining noted. There is a medium amount of serosanguineous drainage noted. There is medium (34-66%) red, pink granulation within the wound bed. There is a medium (34-66%) amount of  necrotic tissue within the wound bed including Adherent Slough. Assessment Active Problems ICD-10 Type 2 diabetes mellitus with foot ulcer Non-pressure chronic ulcer of other part of left foot with fat layer exposed Non-pressure chronic ulcer of other part of left foot limited to breakdown of skin Type 2 diabetes mellitus with diabetic neuropathy, unspecified Procedures Wound #1 Pre-procedure diagnosis of Wound #1 is a Diabetic Wound/Ulcer of the Lower Extremity located on the Left Huang Great .Severity of Tissue Pre Debridement is: oe Fat layer exposed. There was a Chemical/Enzymatic/Mechanical debridement performed by Ricard Dillon., MD.. Other agent used was anacept and gauze. There was no bleeding. The procedure was tolerated well. Post Debridement Measurements: 0.3cm length x 0.7cm width x 0.1cm depth; 0.016cm^3 volume. Character of Wound/Ulcer Post Debridement is improved. Severity of Tissue Post Debridement is: Fat layer exposed. Post procedure Diagnosis Wound #1: Same as Pre-Procedure Wound #2 Pre-procedure diagnosis of Wound #2 is a Diabetic Wound/Ulcer of the Lower Extremity located on the Left Metatarsal head fourth .Severity of Tissue Pre Debridement is: Fat layer exposed. There was a Excisional Skin/Subcutaneous Tissue Debridement with a total area of 1 sq cm performed by Ricard Dillon., MD. With the following instrument(s): Curette to remove Viable and Non-Viable tissue/material. Material removed includes Callus and Subcutaneous Tissue and after achieving pain control using Other (benzocaine, 4%). No specimens were taken. A time out was conducted at 09:51, prior to the start of the procedure. A Minimum amount of bleeding was controlled with Pressure. The procedure was tolerated well with a pain level of 0 throughout and a pain level of 0 following the procedure. Post Debridement Measurements: 0.1cm length x 0.4cm width x 0.4cm depth; 0.013cm^3 volume. Character of  Wound/Ulcer Post Debridement is improved. Severity of Tissue Post Debridement is: Fat layer exposed. Post procedure Diagnosis Wound #2: Same as Pre-Procedure Plan Follow-up Appointments: Return Appointment in 1 week. Dressing Change Frequency: Change dressing every day. Wound Cleansing: May shower and wash wound with soap and water. - on days that dressing is changed Primary Wound Dressing: Wound #1 Left Huang Great: oe Calcium Alginate with Silver Wound #2 Left Metatarsal head fourth: Calcium Alginate with Silver Secondary Dressing: Wound #1 Left Huang Great: oe Foam - foam donut Kerlix/Rolled Gauze Wound #2 Left Metatarsal head fourth: Kerlix/Rolled Gauze - felt Dry Gauze Off-Loading: Other: - front offloader Consults ordered were: Podiatry - [facility] #1 Silver alginate to both wound areas 2. Foam donut 3. Choose one And rolled gauze #4 front foot off loader on the left foot. I am hopeful his balance will allow him to use this. I told him then I would consider a total contact cast if this is not moving towards healing in 2 to 3 weeks. This is a typical neuropathic ulcer 5. He has shoes with custom inserts however looking at the inserts  you can see where the fourth met head location is and indeed he puts a lot of pressure on this area. 6. I saw no evidence of infection or ischemia here. His ABIs were normal peripheral pulses palpable 7. I wrote him a note so he could work from home for another month I have emphasized offloading this area as a way to get this neuropathic wound to heal. I spent 35 minutes in review of this patient's past medical history, face-to-face evaluation and preparation of this record Electronic Signature(s) Signed: 05/27/2020 4:24:19 PM By: Linton Ham MD Entered By: Linton Ham on 05/27/2020 10:34:19 -------------------------------------------------------------------------------- HxROS Details Patient Name: Date of Service: Robert Huang, Robert Arbour Huang. 05/27/2020 9:00 [place] Record Number: [id number] Patient Account Number: [id number] Date of Birth/Sex: Treating RN: [birth year] (59 y.o. Oval Linsey Primary Care Provider: [name] Other Clinician: Referring Provider: Treating Provider/Extender: [name] in Treatment: 0 Information Obtained From Patient Constitutional Symptoms (General Health) Complaints and Symptoms: Negative for: Fatigue; Fever; Chills; Marked Weight Change Eyes Complaints and Symptoms: Positive for: Glasses / Contacts Negative for: Dry Eyes; Vision Changes Medical History: Negative for: Cataracts; Glaucoma; Optic Neuritis Ear/Nose/Mouth/Throat Complaints and Symptoms: Negative for: Chronic sinus problems or rhinitis Medical History: Negative for: Chronic sinus problems/congestion Respiratory Complaints and Symptoms: Negative for: Chronic or frequent coughs; Shortness of Breath Medical History: Negative for: Aspiration; Asthma; Chronic Obstructive Pulmonary Disease (COPD); Pneumothorax; Sleep Apnea; Tuberculosis Cardiovascular Complaints and Symptoms: Negative for: Chest pain Medical History: Positive for: Coronary Artery Disease; Hypertension Negative for: Angina; Arrhythmia; Congestive Heart Failure; Deep Vein Thrombosis; Hypotension; Myocardial Infarction; Peripheral Arterial Disease; Peripheral Venous Disease; Phlebitis; Vasculitis Gastrointestinal Complaints and Symptoms: Negative for: Frequent diarrhea; Nausea; Vomiting Medical History: Negative for: Cirrhosis ; Colitis; Crohns; Hepatitis A; Hepatitis B; Hepatitis C Endocrine Complaints and Symptoms: Negative for: Heat/cold intolerance Medical History: Positive for: Type II Diabetes Negative for: Type I Diabetes Time with diabetes: 15 Treated with: Oral agents Blood sugar tested every day: No Genitourinary Complaints and Symptoms: Negative for: Frequent urination Integumentary  (Skin) Complaints and Symptoms: Positive for: Wounds Medical History: Negative for: History of Burn Musculoskeletal Complaints and Symptoms: Negative for: Muscle Pain; Muscle Weakness Medical History: Negative for: Gout; Rheumatoid Arthritis; Osteoarthritis; Osteomyelitis Neurologic Complaints and Symptoms: Negative for: Numbness/parasthesias Medical History: Negative for: Dementia; Neuropathy; Quadriplegia; Paraplegia; Seizure Disorder Psychiatric Complaints and Symptoms: Negative for: Claustrophobia; Suicidal Medical History: Negative for: Anorexia/bulimia; Confinement Anxiety Hematologic/Lymphatic Medical History: Negative for: Anemia; Hemophilia; Human Immunodeficiency Virus; Lymphedema; Sickle Cell Disease Immunological Medical History: Negative for: Lupus Erythematosus; Raynauds; Scleroderma Oncologic Medical History: Negative for: Received Chemotherapy; Received Radiation Immunizations Pneumococcal Vaccine: Received Pneumococcal Vaccination: No Implantable Devices None Family and Social History Cancer: Yes - Mother,Father; Diabetes: Yes - Father,Paternal Grandparents,Child; Heart Disease: Yes - Mother,Maternal Grandparents,Father,Siblings; Hereditary Spherocytosis: No; Hypertension: Yes - Maternal Grandparents,Paternal Grandparents,Mother,Father,Siblings; Kidney Disease: No; Lung Disease: Yes - Father; Seizures: No; Stroke: No; Thyroid Problems: Yes - Mother; Tuberculosis: No; Never smoker; Marital Status - Married; Alcohol Use: Moderate; Drug Use: No History; Caffeine Use: Daily Electronic Signature(s) Signed: 05/27/2020 4:20:16 PM By: Carlene Coria RN Signed: 05/27/2020 4:24:19 PM By: Linton Ham MD Entered By: Carlene Coria on 05/27/2020 09:25:04 -------------------------------------------------------------------------------- SuperBill Details Patient Name: Date of Service: Robert Huang, Robert Huang 05/27/2020 Medical Record Number: [id number] Patient Account  Number: [id number] Date of Birth/Sex: Treating RN: [birth year] (59 y.o. [name] Primary Care Provider: [name] Other Clinician: Referring Provider: Treating Provider/Extender: Dellia Nims  Hassell Done, Carroll Sage in Treatment: 0 Diagnosis Coding ICD-10 Codes Code Description E11.621 Type 2 diabetes mellitus with foot ulcer L97.522 Non-pressure chronic ulcer of other part of left foot with fat layer exposed L97.521 Non-pressure chronic ulcer of other part of left foot limited to breakdown of skin E11.40 Type 2 diabetes mellitus with diabetic neuropathy, unspecified Facility Procedures CPT4 Code: 03559741 Description: [place] VISIT-LEV 3 EST PT Modifier: 25 Quantity: 1 CPT4 Code: [id number] Description: 68032 - DEB SUBQ TISSUE 20 SQ CM/< ICD-10 Diagnosis Description L97.522 Non-pressure chronic ulcer of other part of left foot with fat layer exposed Modifier: Quantity: 1 Physician Procedures : CPT4 Code Description Modifier [id number] WC PHYS LEVEL 3 NEW PT 25 ICD-10 Diagnosis Description E11.621 Type 2 diabetes mellitus with foot ulcer L97.522 Non-pressure chronic ulcer of other part of left foot with fat layer exposed L97.521 Non-pressure  chronic ulcer of other part of left foot limited to breakdown of skin E11.40 Type 2 diabetes mellitus with diabetic neuropathy, unspecified Quantity: 1 : [id number] 88916 - WC PHYS SUBQ TISS 20 SQ CM ICD-10 Diagnosis Description L97.522 Non-pressure chronic ulcer of other part of left foot with fat layer exposed Quantity: 1 Electronic Signature(s) Signed: 05/27/2020 4:24:19 PM By: Linton Ham MD Entered By: Linton Ham on 05/27/2020 10:34:44

## 2020-06-03 ENCOUNTER — Encounter (HOSPITAL_BASED_OUTPATIENT_CLINIC_OR_DEPARTMENT_OTHER): Payer: No Typology Code available for payment source | Admitting: Internal Medicine

## 2020-06-03 DIAGNOSIS — E11621 Type 2 diabetes mellitus with foot ulcer: Secondary | ICD-10-CM | POA: Diagnosis not present

## 2020-06-03 DIAGNOSIS — L97522 Non-pressure chronic ulcer of other part of left foot with fat layer exposed: Secondary | ICD-10-CM | POA: Diagnosis not present

## 2020-06-03 DIAGNOSIS — E1140 Type 2 diabetes mellitus with diabetic neuropathy, unspecified: Secondary | ICD-10-CM | POA: Diagnosis not present

## 2020-06-03 DIAGNOSIS — Z89422 Acquired absence of other left toe(s): Secondary | ICD-10-CM | POA: Diagnosis not present

## 2020-06-03 DIAGNOSIS — Z955 Presence of coronary angioplasty implant and graft: Secondary | ICD-10-CM | POA: Diagnosis not present

## 2020-06-03 DIAGNOSIS — I2510 Atherosclerotic heart disease of native coronary artery without angina pectoris: Secondary | ICD-10-CM | POA: Diagnosis not present

## 2020-06-03 DIAGNOSIS — E1151 Type 2 diabetes mellitus with diabetic peripheral angiopathy without gangrene: Secondary | ICD-10-CM | POA: Diagnosis not present

## 2020-06-03 DIAGNOSIS — I10 Essential (primary) hypertension: Secondary | ICD-10-CM | POA: Diagnosis not present

## 2020-06-06 NOTE — Progress Notes (Signed)
Robert, ESCHBACH Huang ([id number]) . Visit Report for 06/03/2020 HPI Details Patient Name: Date of Service: Robert Huang 06/03/2020 9:00 A M Medical Record Number: [id number] Patient Account Number: [id number] Date of Birth/Sex: Treating RN: 12/16/1960 (59 y.o. Robert Huang Primary Care Provider: Laurey Huang Other Clinician: Referring Provider: Treating Provider/Extender: [name] in Treatment: 1 History of Present Illness HPI Description: ADMISSION 05/27/2020 This is a 59 year old man who has type 2 diabetes who is here for wounds on his left foot in 2 locations. The patient has had problems with foot ulcers requiring surgery including a right great toe amputation and a left fifth ray amputation in July 2020 for underlying osteomyelitis although strangely I cannot see any pathology or cultures done in this area. He states that the area on his left fourth met head plantar aspect opened about a month ago. He has had continuous callus in this area. He saw Dr. Sarajane Jews his primary doctor at the beginning of September and was felt to have a cellulitis in the lateral part of his foot in this area he was given a shot of Rocephin and then ongoing antibiotics with Keflex for 10 days. It would appear that he was supposed to follow-up with Dr. Sarajane Jews although I do not see these notes. About a week and a half ago he stubbed his left first toe and has a wound on this area. He is using Neosporin to both wound areas. 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 Sarajane Jews ABI in our clinic was 1.23 on the left 10/8; the patient's area over the left fourth metatarsal head is fully closed.  This was a split callus last week that I debrided. He still has an open area over the tip of his left great toe. He is using a forefoot offloading and silver alginate. We [facility] appointment [facility] to see about custom made diabetic shoes with inserts this appointment is for next Tuesday. Electronic Signature(s) Signed: 06/06/2020 5:03:49 PM By: Linton Ham MD Entered By: Linton Ham on 06/03/2020 09:41:02 -------------------------------------------------------------------------------- Physical Exam Details Patient Name: Date of Service: Robert Huang, Robert Arbour Huang. 06/03/2020 9:00 A M Medical Record Number: [id number] Patient Account Number: [id number] Date of Birth/Sex: Treating RN: 01-Sep-1960 (59 y.o. Robert Huang Primary Care Provider: Laurey Huang Other Clinician: Referring Provider: Treating Provider/Extender: [name] in Treatment: 1 Constitutional Sitting or standing Blood Pressure is within target range for patient.. Pulse regular and within target range for patient.[name] Respirations regular, non-labored and within target range.. Temperature is normal and within the target range for the patient.[name] Appears in no distress. Cardiovascular Pedal pulses palpable. Notes Wound exam The patient's major areas over the left fourth metatarsal head that is closed over this week. He will need to offload this going forward until he can get proper diabetic foot wear with custom inserts. Still an open area in the left first great toe tip. I think another week of silver alginate and this should be closed as well Electronic Signature(s) Signed: 06/06/2020 5:03:49 PM By: Linton Ham MD Entered By: Linton Ham on 06/03/2020 09:43:59 -------------------------------------------------------------------------------- Physician Orders Details  Patient Name: Date of Service: Robert Huang 06/03/2020 9:00 A M Medical Record Number:  [id number] Patient Account Number: [id number] Date of Birth/Sex: Treating RN: July 22, 1961 (59 y.o. Robert Huang Primary Care Provider: Laurey Huang Other Clinician: Referring Provider: Treating Provider/Extender: [name] in Treatment: 1 Verbal / Phone Orders: No Diagnosis Coding ICD-10 Coding Code Description E11.621 Type 2 diabetes mellitus with foot ulcer L97.522 Non-pressure chronic ulcer of other part of left foot with fat layer exposed L97.521 Non-pressure chronic ulcer of other part of left foot limited to breakdown of skin E11.40 Type 2 diabetes mellitus with diabetic neuropathy, unspecified Follow-up Appointments Return Appointment in 1 week. Dressing Change Frequency Change dressing every day. Other: - use antibiotic ointment and bandaid for left thumb Wound Cleansing May shower and wash wound with soap and water. - on days that dressing is changed Primary Wound Dressing Wound #1 Left Huang Great oe Calcium Alginate with Silver Secondary Dressing Wound #1 Left Huang Great oe Foam - foam donut Kerlix/Rolled Gauze Other: - place foam on left 4th met head to protect Off-Loading Other: - front Dietitian) Signed: 06/03/2020 5:35:06 PM By: Kela Millin Signed: 06/06/2020 5:03:49 PM By: Linton Ham MD Entered By: Kela Millin on 06/03/2020 09:26:41 -------------------------------------------------------------------------------- Problem List Details Patient Name: Date of Service: Robert Huang, Robert Arbour Huang. 06/03/2020 9:00 A M Medical Record Number: [id number] Patient Account Number: [id number] Date of Birth/Sex: Treating RN: [birth year] (59 y.o. Robert Huang Primary Care Provider: Other Clinician: Laurey Huang Referring Provider: Treating Provider/Extender: [name] in Treatment: 1 Active Problems ICD-10 Encounter Code Description Active Date MDM Diagnosis E11.621  Type 2 diabetes mellitus with foot ulcer 05/27/2020 No Yes L97.522 Non-pressure chronic ulcer of other part of left foot with fat layer exposed 05/27/2020 No Yes L97.521 Non-pressure chronic ulcer of other part of left foot limited to breakdown of 05/27/2020 No Yes skin E11.40 Type 2 diabetes mellitus with diabetic neuropathy, unspecified 05/27/2020 No Yes Inactive Problems Resolved Problems Electronic Signature(s) Signed: 06/06/2020 5:03:49 PM By: Linton Ham MD Entered By: Linton Ham on 06/03/2020 09:36:25 -------------------------------------------------------------------------------- Progress Note Details Patient Name: Date of Service: Robert Huang, Robert Arbour Huang. 06/03/2020 9:00 A M Medical Record Number: [id number] Patient Account Number: [id number] Date of Birth/Sex: Treating RN: [birth year] (59 y.o. Robert Huang Primary Care Provider: Laurey Huang Other Clinician: Referring Provider: Treating Provider/Extender: [name] in Treatment: 1 Subjective History of Present Illness (HPI) ADMISSION 05/27/2020 This is a 59 year old man who has type 2 diabetes who is here for wounds on his left foot in 2 locations. The patient has had problems with foot ulcers requiring surgery including a right great toe amputation and a left fifth ray amputation in July 2020 for underlying osteomyelitis although strangely I cannot see any pathology or cultures done in this area. He states that the area on his left fourth met head plantar aspect opened about a month ago. He has had continuous callus in this area. He saw Dr. Sarajane Jews his primary doctor at the beginning of September and was felt to have a cellulitis in the lateral part of his foot in this area he was given a shot of Rocephin and then ongoing antibiotics with Keflex for 10 days. It would appear that he was supposed to follow-up with Dr. Sarajane Jews although I do not see these notes. About a week and a half ago he stubbed  his left first toe and has a wound on this area. He is using Neosporin to both wound areas. 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 Sarajane Jews ABI in our clinic was 1.23 on the left 10/8; the patient's area over the left fourth metatarsal head is fully closed. This was a split callus last week that I debrided. He still has an open area over the tip of his left great toe. He is using a forefoot offloading and silver alginate. We made him an appointment with [facility] to see about custom made diabetic shoes with inserts this appointment is for next Tuesday. Objective Constitutional Sitting or standing Blood Pressure is within target range for patient.. Pulse regular and within target range for patient.[name] Respirations regular, non-labored and within target range.. Temperature is normal and within the target range for the patient.[name] Appears in no distress. Vitals Time Taken: 9:04 AM, Height: 72 in, Weight: 212 lbs, BMI: 28.7, Temperature: 98.7 F, Pulse: 71 bpm, Respiratory Rate: 18 breaths/min, Blood Pressure: 119/72 mmHg. Cardiovascular Pedal pulses palpable. General Notes: Wound exam ooThe patient's major areas over the left fourth metatarsal head that is closed over this week. He will need to offload this going forward until he can get proper diabetic foot wear with custom inserts. ooStill an open area in the left first great toe tip. I think another week of silver alginate and this should be closed as well Integumentary (Hair, Skin) Wound #1 status is Open. Original cause of wound was Gradually Appeared. The wound is located on the Left Huang Great. The wound measures 0.2cm length x oe 0.2cm width  x 0.1cm depth; 0.031cm^2 area and 0.003cm^3 volume. There is Fat Layer (Subcutaneous Tissue) exposed. There is no tunneling or undermining noted. There is a medium amount of serosanguineous drainage noted. There is medium (34-66%) pink granulation within the wound bed. There is a medium (34- 66%) amount of necrotic tissue within the wound bed including Adherent Slough. Wound #2 status is Healed - Epithelialized. Original cause of wound was Gradually Appeared. The wound is located on the Left Metatarsal head fourth. The wound measures 0cm length x 0cm width x 0cm depth; 0cm^2 area and 0cm^3 volume. There is no tunneling or undermining noted. There is a none present amount of drainage noted. The wound margin is distinct with the outline attached to the wound base. There is no granulation within the wound bed. There is no necrotic tissue within the wound bed. Wound #3 status is Healed - Epithelialized. Original cause of wound was Trauma. The wound is located on the Left Hand - 1st Digit. The wound measures 0cm length x 0cm width x 0cm depth; 0cm^2 area and 0cm^3 volume. There is no tunneling or undermining noted. There is a none present amount of drainage noted. There is no granulation within the wound bed. There is no necrotic tissue within the wound bed. Assessment Active Problems ICD-10 Type 2 diabetes mellitus with foot ulcer Non-pressure chronic ulcer of other part of left foot with fat layer exposed Non-pressure chronic ulcer of other part of left foot limited to breakdown of skin Type 2 diabetes mellitus with diabetic neuropathy, unspecified Plan Follow-up Appointments: Return Appointment in 1 week.  Dressing Change Frequency: Change dressing every day. Other: - use antibiotic ointment and bandaid for left thumb Wound Cleansing: May shower and wash wound with soap and water. - on days that dressing is changed Primary Wound Dressing: Wound #1 Left Huang Great: oe Calcium Alginate with  Silver Secondary Dressing: Wound #1 Left Huang Great: oe Foam - foam donut Kerlix/Rolled Gauze Other: - place foam on left 4th met head to protect Off-Loading: Other: - front offloader 1. We continued with the silver alginate to the left great toe 2. We will offload the left fourth met head with border foam 3. He has an appointment with [facility] next week to look at custom shoes for his foot. He is already had a left fifth ray amputation clearly spends an inordinate amount of time on the lateral part of his foot hopefully something can be created to adjust this pressure which I think is going to be paramount to protecting his foot in the future 4. Incidentally had a small glass injury he showed [place] on his left thumb. This looked healthy antibiotic cream and Band-Aid. We did not to find this Electronic Signature(s) Signed: 06/06/2020 5:03:49 PM By: Linton Ham MD Entered By: Linton Ham on 06/03/2020 09:45:58 -------------------------------------------------------------------------------- SuperBill Details Patient Name: Date of Service: Robert Huang, Robert Arbour Huang. 06/03/2020 Medical Record Number: [id number] Patient Account Number: [id number] Date of Birth/Sex: Treating RN: 10-07-1960 (59 y.o. Robert Huang Primary Care Provider: Laurey Huang Other Clinician: Referring Provider: Treating Provider/Extender: [name] in Treatment: 1 Diagnosis Coding ICD-10 Codes Code Description [phone] Type 2 diabetes mellitus with foot ulcer L97.522 Non-pressure chronic ulcer of other part of left foot with fat layer exposed L97.521 Non-pressure chronic ulcer of other part of left foot limited to breakdown of skin E11.40 Type 2 diabetes mellitus with diabetic neuropathy, unspecified Facility Procedures CPT4 Code: [id number] Description: 99213 - WOUND CARE VISIT-LEV 3 EST PT Modifier: Quantity: 1 Physician Procedures : CPT4 Code Description  Modifier [id number] - WC PHYS LEVEL 3 - EST PT ICD-10 Diagnosis Description E11.621 Type 2 diabetes mellitus with foot ulcer L97.522 Non-pressure chronic ulcer of other part of left foot with fat layer exposed L97.521  Non-pressure chronic ulcer of other part of left foot limited to breakdown of skin Quantity: 1 Electronic Signature(s) Signed: 06/06/2020 5:03:49 PM By: Linton Ham MD Entered By: Linton Ham on 06/03/2020 09:47:36

## 2020-06-07 ENCOUNTER — Ambulatory Visit (INDEPENDENT_AMBULATORY_CARE_PROVIDER_SITE_OTHER): Payer: No Typology Code available for payment source | Admitting: Podiatry

## 2020-06-07 ENCOUNTER — Encounter: Payer: Self-pay | Admitting: Podiatry

## 2020-06-07 ENCOUNTER — Other Ambulatory Visit: Payer: Self-pay

## 2020-06-07 DIAGNOSIS — E1142 Type 2 diabetes mellitus with diabetic polyneuropathy: Secondary | ICD-10-CM

## 2020-06-07 DIAGNOSIS — M2141 Flat foot [pes planus] (acquired), right foot: Secondary | ICD-10-CM | POA: Diagnosis not present

## 2020-06-07 DIAGNOSIS — M2142 Flat foot [pes planus] (acquired), left foot: Secondary | ICD-10-CM | POA: Diagnosis not present

## 2020-06-07 DIAGNOSIS — M2042 Other hammer toe(s) (acquired), left foot: Secondary | ICD-10-CM

## 2020-06-07 DIAGNOSIS — E08621 Diabetes mellitus due to underlying condition with foot ulcer: Secondary | ICD-10-CM

## 2020-06-07 DIAGNOSIS — Z89422 Acquired absence of other left toe(s): Secondary | ICD-10-CM | POA: Diagnosis not present

## 2020-06-07 DIAGNOSIS — L97502 Non-pressure chronic ulcer of other part of unspecified foot with fat layer exposed: Secondary | ICD-10-CM

## 2020-06-07 NOTE — Progress Notes (Signed)
ESTEFAN, PATTISON T ([id number]) . Visit Report for 06/03/2020 Arrival Information Details Patient Name: Date of Service: Robert Huang 06/03/2020 9:00 A M Medical Record Number: [id number] Patient Account Number: [id number] Date of Birth/Sex: Treating RN: June 21, 1961 (59 y.o. Robert Huang) Carlene Coria Primary Care Vincenta Steffey: [name] Other Clinician: Referring Amed Datta: Treating Deann Mclaine/Extender: [name] in Treatment: 1 Visit Information History Since Last Visit All ordered tests and consults were completed: No Patient Arrived: Ambulatory Added or deleted any medications: No Arrival Time: 09:03 Any new allergies or adverse reactions: No Accompanied By: self Had a fall or experienced change in No Transfer Assistance: None activities of daily living that may affect Patient Identification Verified: Yes risk of falls: Secondary Verification Process Completed: Yes Signs or symptoms of abuse/neglect since last visito No Patient Requires Transmission-Based Precautions: No Hospitalized since last visit: No Patient Has Alerts: No Implantable device outside of the clinic excluding No cellular tissue based products placed in the center since last visit: Has Dressing in Place as Prescribed: Yes Pain Present Now: No Electronic Signature(s) Signed: 06/07/2020 5:49:00 PM By: Carlene Coria RN Entered By: Carlene Coria on 06/03/2020 09:03:59 -------------------------------------------------------------------------------- Clinic Level of Care Assessment Details Patient Name: Date of Service: Robert Huang 06/03/2020 9:00 A M Medical Record Number: [id number] Patient Account Number: [id number] Date of Birth/Sex: Treating RN: 1960-08-30 (59 y.o. Robert Huang Primary Care Randon Somera: [name] Other Clinician: Referring Chance Munter: Treating Rosalita Carey/Extender: [name] in Treatment: 1 Clinic Level of Care Assessment  Items TOOL 4 Quantity Score X- 1 0 Use when only an EandM is performed on FOLLOW-UP visit ASSESSMENTS - Nursing Assessment / Reassessment X- 1 10 Reassessment of Co-morbidities (includes updates in patient status) X- 1 5 Reassessment of Adherence to Treatment Plan ASSESSMENTS - Wound and Skin A ssessment / Reassessment _0  - 0 Simple Wound Assessment / Reassessment - one wound X- 2 5 Complex Wound Assessment / Reassessment - multiple wounds _1  - 0 Dermatologic / Skin Assessment (not related to wound area) ASSESSMENTS - Focused Assessment X- 1 5 Circumferential Edema Measurements - multi extremities _2  - 0 Nutritional Assessment / Counseling / Intervention _3  - 0 Lower Extremity Assessment (monofilament, tuning fork, pulses) _4  - 0 Peripheral Arterial Disease Assessment (using hand held doppler) ASSESSMENTS - Ostomy and/or Continence Assessment and Care _5  - 0 Incontinence Assessment and Management _6  - 0 Ostomy Care Assessment and Management (repouching, etc.) PROCESS - Coordination of Care X - Simple Patient / Family Education for ongoing care 1 15 _7  - 0 Complex (extensive) Patient / Family Education for ongoing care X- 1 10 Staff obtains Programmer, systems, Records, T Results / Process Orders est _8  - 0 Staff telephones HHA, Nursing Homes / Clarify orders / etc _9  - 0 Routine Transfer to another Facility (non-emergent condition) _10  - 0 Routine Hospital Admission (non-emergent condition) _11  - 0 New Admissions / Biomedical engineer / Ordering NPWT Apligraf, etc. , _12  - 0 Emergency Hospital Admission (emergent condition) X- 1 10 Simple Discharge Coordination _13  - 0 Complex (extensive) Discharge Coordination PROCESS - Special Needs _14  - 0 Pediatric / Minor Patient Management _15  - 0 Isolation Patient Management _16  - 0 Hearing / Language / Visual special needs _17  - 0 Assessment of Community assistance (transportation, D/C planning, etc.) _18  - 0 Additional  assistance / Altered mentation _19  - 0 Support Surface(s) Assessment (bed, cushion, seat, etc.) INTERVENTIONS - Wound Cleansing / Measurement _20  -  0 Simple Wound Cleansing - one wound X- 2 5 Complex Wound Cleansing - multiple wounds X- 1 5 Wound Imaging (photographs - any number of wounds) _0  - 0 Wound Tracing (instead of photographs) _1  - 0 Simple Wound Measurement - one wound X- 2 5 Complex Wound Measurement - multiple wounds INTERVENTIONS - Wound Dressings X - Small Wound Dressing one or multiple wounds 1 10 _2  - 0 Medium Wound Dressing one or multiple wounds _3  - 0 Large Wound Dressing one or multiple wounds X- 1 5 Application of Medications - topical <[id number]>_6<\/[id number]>_6  - 0 Application of Medications - injection INTERVENTIONS - Miscellaneous _5  - 0 External ear exam _6  - 0 Specimen Collection (cultures, biopsies, blood, body fluids, etc.) _7  - 0 Specimen(s) / Culture(s) sent or taken to Lab for analysis _8  - 0 Patient Transfer (multiple staff / Civil Service fast streamer / Similar devices) _9  - 0 Simple Staple / Suture removal (25 or less) _10  - 0 Complex Staple / Suture removal (26 or more) _11  - 0 Hypo / Hyperglycemic Management (close monitor of Blood Glucose) _12  - 0 Ankle / Brachial Index (ABI) - do not check if billed separately X- 1 5 Vital Signs Has the patient been seen at the hospital within the last three years: Yes Total Score: 110 Level Of Care: New/Established - Level 3 Electronic Signature(s) Signed: 06/03/2020 5:35:06 PM By: Robert Huang Entered By: Robert Huang on 06/03/2020 09:35:06 -------------------------------------------------------------------------------- Encounter Discharge Information Details Patient Name: Date of Service: Robert Huang, Robert Arbour T. 06/03/2020 9:00 A M Medical Record Number: [id number] Patient Account Number: [id number] Date of Birth/Sex: Treating RN: 1961-07-10 (59 y.o. Robert Huang, Robert Huang Primary Care Iverna Hammac: [name] Other  Clinician: Referring Kemon Devincenzi: Treating Charisa Twitty/Extender: [name] in Treatment: 1 Encounter Discharge Information Items Discharge Condition: Stable Ambulatory Status: Ambulatory Discharge Destination: Home Transportation: Private Auto Accompanied By: self Schedule Follow-up Appointment: Yes Clinical Summary of Care: Electronic Signature(s) Signed: 06/03/2020 5:50:34 PM By: Deon Pilling Entered By: Deon Pilling on 06/03/2020 09:50:53 -------------------------------------------------------------------------------- Lower Extremity Assessment Details Patient Name: Date of Service: Robert Huang 06/03/2020 9:00 A M Medical Record Number: [id number] Patient Account Number: [id number] Date of Birth/Sex: Treating RN: Jan 02, 1961 (59 y.o. Robert Huang Primary Care Macintyre Alexa: [name] Other Clinician: Referring Oather Muilenburg: Treating Tahiri Shareef/Extender: [name] in Treatment: 1 Edema Assessment Assessed: Shirlyn Goltz: No] [Huang: No] [Left: Edema] [Huang: :] Calf Left: Huang: Point of Measurement: 43 cm From Medial Instep 36.5 cm Ankle Left: Huang: Point of Measurement: 10 cm From Medial Instep 22 cm Electronic Signature(s) Signed: 06/07/2020 5:49:00 PM By: Carlene Coria RN Entered By: Carlene Coria on 06/03/2020 09:05:05 -------------------------------------------------------------------------------- Multi Wound Chart Details Patient Name: Date of Service: Robert Huang, Robert Arbour T. 06/03/2020 9:00 A M Medical Record Number: [id number] Patient Account Number: [id number] Date of Birth/Sex: Treating RN: [birth year] (59 y.o. Robert Huang Primary Care Briauna Gilmartin: [name] Other Clinician: Referring Shelbylynn Walczyk: Treating Leveta Wahab/Extender: [name] in Treatment: 1 Vital Signs Height(in): 72 Pulse(bpm): 19 Weight(lbs): 212 Blood Pressure(mmHg): 119/72 Body Mass Index(BMI):  29 Temperature(F): 98.7 Respiratory Rate(breaths/min): 18 Photos: [1:No Photos Left T Great oe] [2:No Photos Left Metatarsal head fourth] [3:No Photos Left Hand - 1st Digit] Wound Location: [1:Gradually Appeared] [2:Gradually Appeared] [3:Trauma] Wounding Event: [1:Diabetic Wound/Ulcer of the Lower] [2:Diabetic Wound/Ulcer of the Lower] [3:Trauma, Other] Primary Etiology: [1:Extremity Coronary Artery Disease,] [2:Extremity Coronary Artery Disease,] [3:Coronary Artery Disease,] Comorbid History: [1:Hypertension,  Type II Diabetes 05/20/2020] [2:Hypertension, Type II Diabetes 04/27/2020] [3:Hypertension, Type II Diabetes 06/01/2020] Date Acquired: [1:1] [2:1] [3:0] Weeks of Treatment: [1:Open] [2:Healed - Epithelialized] [3:Healed - Epithelialized] Wound Status: [1:0.2x0.2x0.1] [2:0x0x0] [3:0x0x0] Measurements L x W x D (cm) [1:0.031] [2:0] [3:0] A (cm) : rea [1:0.003] [2:0] [3:0] Volume (cm) : [1:81.20%] [2:100.00%] [3:N/A] % Reduction in A rea: [1:81.30%] [2:100.00%] [3:N/A] % Reduction in Volume: [1:Grade 2] [2:Grade 2] [3:Partial Thickness] Classification: [1:Medium] [2:None Present] [3:None Present] Exudate A mount: [1:Serosanguineous] [2:N/A] [3:N/A] Exudate Type: [1:red, brown] [2:N/A] [3:N/A] Exudate Color: [1:N/A] [2:Distinct, outline attached] [3:N/A] Wound Margin: [1:Medium (34-66%)] [2:None Present (0%)] [3:None Present (0%)] Granulation A mount: [1:Pink] [2:N/A] [3:N/A] Granulation Quality: [1:Medium (34-66%)] [2:None Present (0%)] [3:None Present (0%)] Necrotic A mount: [1:Fat Layer (Subcutaneous Tissue): Yes Fascia: No] [3:Fascia: No] Exposed Structures: [1:Fascia: No Tendon: No Muscle: No Joint: No Bone: No None] [2:Fat Layer (Subcutaneous Tissue): No Tendon: No Muscle: No Joint: No Bone: No None] [3:Fat Layer (Subcutaneous Tissue): No Tendon: No Muscle: No Joint: No Bone: No Large (67-100%)] Treatment Notes Electronic Signature(s) Signed: 06/03/2020 5:35:06 PM By: Cherylin Mylar Signed: 06/06/2020 5:03:49 PM By: Baltazar Najjar MD Entered By: Baltazar Najjar on 06/03/2020 09:36:31 -------------------------------------------------------------------------------- Multi-Disciplinary Care Plan Details Patient Name: Date of Service: Robert Huang, Robert Debar T. 06/03/2020 9:00 A M Medical Record Number: [id number] Patient Account Number: [id number] Date of Birth/Sex: Treating RN: 1961-08-13 (59 y.o. Robert Huang Primary Care Jeremaine Maraj: [name] Other Clinician: Referring Donato Studley: Treating Denis Koppel/Extender: Doran Stabler in Treatment: 1 Active Inactive Nutrition Nursing Diagnoses: Impaired glucose control: actual or potential Goals: Patient/caregiver verbalizes understanding of need to maintain therapeutic glucose control per primary care physician Date Initiated: 05/27/2020 Target Resolution Date: 06/27/2020 Goal Status: Active Interventions: Provide education on elevated blood sugars and impact on wound healing Notes: Orientation to the Wound Care Program Nursing Diagnoses: Knowledge deficit related to the wound healing center program Goals: Patient/caregiver will verbalize understanding of the Wound Healing Center Program Date Initiated: 05/27/2020 Target Resolution Date: 06/27/2020 Goal Status: Active Interventions: Provide education on orientation to the wound center Notes: Wound/Skin Impairment Nursing Diagnoses: Impaired tissue integrity Goals: Ulcer/skin breakdown will have a volume reduction of 50% by week 8 Date Initiated: 05/27/2020 Target Resolution Date: 06/27/2020 Goal Status: Active Interventions: Provide education on ulcer and skin care Notes: Electronic Signature(s) Signed: 06/03/2020 5:35:06 PM By: Cherylin Mylar Entered By: Cherylin Mylar on 06/03/2020 09:11:40 -------------------------------------------------------------------------------- Pain Assessment Details Patient Name: Date of  Service: Robert Huang, Robert Debar T. 06/03/2020 9:00 A M Medical Record Number: [id number] Patient Account Number: [id number] Date of Birth/Sex: Treating RN: 02/04/1961 (59 y.o. Melonie [place] Primary Care Yale Golla: [name] Other Clinician: Referring Khamryn Calderone: Treating Massiah Longanecker/Extender: Doran Stabler in Treatment: 1 Active Problems Location of Pain Severity and Description of Pain Patient Has Paino No Site Locations Pain Management and Medication Current Pain Management: Electronic Signature(s) Signed: 06/07/2020 5:49:00 PM By: Yevonne Pax RN Entered By: Yevonne Pax on 06/03/2020 09:04:33 -------------------------------------------------------------------------------- Patient/Caregiver Education Details Patient Name: Date of Service: Robert Huang 10/8/2021andnbsp9:00 A M Medical Record Number: [id number] Patient Account Number: [id number] Date of Birth/Gender: Treating RN: 05/17/1961 (59 y.o. Robert Huang Primary Care Physician: [name] Other Clinician: Referring Physician: Treating Physician/Extender: Doran Stabler in Treatment: 1 Education Assessment Education Provided To: Patient Education Topics Provided Welcome T [facility]: o Handouts: Welcome T [facility]  o Methods: Explain/Verbal Wound/Skin Impairment: Handouts: Caring for Your Ulcer Methods: Explain/Verbal Responses: State content correctly Electronic Signature(s) Signed: 06/03/2020 5:35:06 PM By: Robert Huang Entered By: Robert Huang on 06/03/2020 09:11:58 -------------------------------------------------------------------------------- Wound Assessment Details Patient Name: Date of Service: Robert Huang, Robert Arbour T. 06/03/2020 9:00 A M Medical Record Number: [id number] Patient Account Number: [id number] Date of Birth/Sex: Treating RN: 06/21/1961 (59 y.o. Robert Huang) Carlene Coria Primary Care Claudia Greenley: [name] Other Clinician: Referring Jones Viviani: Treating Luqman Perrelli/Extender: [name] in Treatment: 1 Wound Status Wound Number: 1 Primary Etiology: Diabetic Wound/Ulcer of the Lower Extremity Wound Location: Left T Great oe Wound Status: Open Wounding Event: Gradually Appeared Comorbid History: Coronary Artery Disease, Hypertension, Type II Diabetes Date Acquired: 05/20/2020 Weeks Of Treatment: 1 Clustered Wound: No Photos Photo Uploaded By: Mikeal Hawthorne on 06/06/2020 13:22:33 Wound Measurements Length: (cm) 0.2 % Redu Width: (cm) 0.2 % Redu Depth: (cm) 0.1 Epithe Area: (cm) 0.031 Tunne Volume: (cm) 0.003 Under ction in Area: 81.2% ction in Volume: 81.3% lialization: None ling: No mining: No Wound Description Classification: Grade 2 Foul O Exudate Amount: Medium Slough Exudate Type: Serosanguineous Exudate Color: red, brown dor After Cleansing: No /Fibrino Yes Wound Bed Granulation Amount: Medium (34-66%) Exposed Structure Granulation Quality: Pink Fascia Exposed: No Necrotic Amount: Medium (34-66%) Fat Layer (Subcutaneous Tissue) Exposed: Yes Necrotic Quality: Adherent Slough Tendon Exposed: No Muscle Exposed: No Joint Exposed: No Bone Exposed: No Treatment Notes Wound #1 (Left Toe Great) 1. Cleanse With Wound Cleanser 3. Primary Dressing Applied Calcium Alginate Ag 4. Secondary Dressing Dry Gauze Roll Gauze Foam 5. Secured With Medipore tape 7. Footwear/Offloading device applied Wedge shoe Notes foam donut as secondary to great toe. antibiotic ointment and bandaid to thumb. padded 5th met head as well. Electronic Signature(s) Signed: 06/07/2020 5:49:00 PM By: Carlene Coria RN Entered By: Carlene Coria on 06/03/2020 09:10:40 -------------------------------------------------------------------------------- Wound Assessment Details Patient Name: Date of Service: Robert Huang 06/03/2020 9:00 A M Medical Record  Number: [id number] Patient Account Number: [id number] Date of Birth/Sex: Treating RN: 19-Dec-1960 (59 y.o. Robert Huang Primary Care Manases Etchison: [name] Other Clinician: Referring Janyia Guion: Treating Quinne Pires/Extender: [name] in Treatment: 1 Wound Status Wound Number: 2 Primary Etiology: Diabetic Wound/Ulcer of the Lower Extremity Wound Location: Left Metatarsal head fourth Wound Status: Healed - Epithelialized Wounding Event: Gradually Appeared Comorbid History: Coronary Artery Disease, Hypertension, Type II Diabetes Date Acquired: 04/27/2020 Weeks Of Treatment: 1 Clustered Wound: No Photos Photo Uploaded By: Mikeal Hawthorne on 06/06/2020 13:22:33 Wound Measurements Length: (cm) Width: (cm) Depth: (cm) Area: (cm) Volume: (cm) 0 % Reduction in Area: 100% 0 % Reduction in Volume: 100% 0 Epithelialization: None 0 Tunneling: No 0 Undermining: No Wound Description Classification: Grade 2 Wound Margin: Distinct, outline attached Exudate Amount: None Present Foul Odor After Cleansing: No Slough/Fibrino No Wound Bed Granulation Amount: None Present (0%) Exposed Structure Necrotic Amount: None Present (0%) Fascia Exposed: No Fat Layer (Subcutaneous Tissue) Exposed: No Tendon Exposed: No Muscle Exposed: No Joint Exposed: No Bone Exposed: No Electronic Signature(s) Signed: 06/03/2020 5:35:06 PM By: Robert Huang Entered By: Robert Huang on 06/03/2020 09:21:38 -------------------------------------------------------------------------------- Wound Assessment Details Patient Name: Date of Service: Robert Huang, Robert Arbour T. 06/03/2020 9:00 A M Medical Record Number: [id number] Patient Account Number: [id number] Date of Birth/Sex: Treating RN: [birth year] (59 y.o. Robert Huang Primary Care Shavana Calder: [name] Other Clinician: Referring Anglea Gordner: Treating Aerianna Losey/Extender: [name]  in  Treatment: 1 Wound Status Wound Number: 3 Primary Etiology: Trauma, Other Wound Location: Left Hand - 1st Digit Wound Status: Healed - Epithelialized Wounding Event: Trauma Comorbid History: Coronary Artery Disease, Hypertension, Type II Diabetes Date Acquired: 06/01/2020 Weeks Of Treatment: 0 Clustered Wound: No Photos Photo Uploaded By: Mikeal Hawthorne on 06/06/2020 13:23:03 Wound Measurements Length: (cm) Width: (cm) Depth: (cm) Area: (cm) Volume: (cm) 0 % Reduction in Area: 0 % Reduction in Volume: 0 Epithelialization: Large (67-100%) 0 Tunneling: No 0 Undermining: No Wound Description Classification: Partial Thickness Exudate Amount: None Present Foul Odor After Cleansing: No Slough/Fibrino No Wound Bed Granulation Amount: None Present (0%) Exposed Structure Necrotic Amount: None Present (0%) Fascia Exposed: No Fat Layer (Subcutaneous Tissue) Exposed: No Tendon Exposed: No Muscle Exposed: No Joint Exposed: No Bone Exposed: No Electronic Signature(s) Signed: 06/03/2020 5:35:06 PM By: Robert Huang Entered By: Robert Huang on 06/03/2020 09:26:30 -------------------------------------------------------------------------------- Vitals Details Patient Name: Date of Service: Robert Huang, Robert Arbour T. 06/03/2020 9:00 A M Medical Record Number: [id number] Patient Account Number: [id number] Date of Birth/Sex: Treating RN: Jul 10, 1961 (59 y.o. Robert Huang) Carlene Coria Primary Care Elzina Devera: [name] Other Clinician: Referring Montie Swiderski: Treating Kami Kube/Extender: [name] in Treatment: 1 Vital Signs Time Taken: 09:04 Temperature (F): 98.7 Height (in): 72 Pulse (bpm): 71 Weight (lbs): 212 Respiratory Rate (breaths/min): 18 Body Mass Index (BMI): 28.7 Blood Pressure (mmHg): 119/72 Reference Range: 80 - 120 mg / dl Electronic Signature(s) Signed: 06/07/2020 5:49:00 PM By: Carlene Coria RN Entered By: Carlene Coria on 06/03/2020  09:04:26

## 2020-06-07 NOTE — Progress Notes (Signed)
  Subjective:  Patient ID: Robert Huang, male    DOB: [birth year],  MRN: [id number]  Chief Complaint  Patient presents with  . Diabetic Ulcer    left foot, A1C  7.6  . Hammer Toe    right foot  . Blister    59 y.o. male presents with the above complaint. History confirmed with patient.  Has a history of previous partial fifth ray amputation on the left foot by Dr. Aundria Rud at [facility] in 2020.  He developed recurrent ulceration under the fourth metatarsal head as well as the tip of the hallux on the left foot.  He has been seen by Dr. Leanord Hawking at the [facility].  He is referred to [place] for evaluation and diabetic shoes.  Objective:  Physical Exam: warm, good capillary refill and normal DP and PT pulses.  Abnormal monofilament exam and loss of protective sensation plantar foot.  Superficial ulceration that is full-thickness of the distal tip of the hallux measuring 6 mm x 3 mm x 1 mm, punctate ulcerations submet 4 measuring 2 mm x 2 mm x 1 mm.  Minimal drainage from either wound.  Mild hyperkeratosis.  No signs of infection.  Assessment:   1. Diabetic ulcer of foot associated with diabetes mellitus due to underlying condition, with fat layer exposed, unspecified laterality, unspecified part of foot (HCC)   2. Type 2 diabetes mellitus with polyneuropathy (HCC)   3. 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 Hammertoe of left foot   5. Pes planus of both feet      Plan:  Patient was evaluated and treated and all questions answered.  Patient educated on diabetes. Discussed proper diabetic foot care and discussed risks and complications of disease. Educated patient in depth on reasons to return to the office immediately should he/she discover anything concerning or new on the feet. All questions answered. Discussed proper shoes as well.   Believe he would benefit from an extra-depth diabetic shoe with a multidensity insole to offload the pressure  areas along the fifth ray amp site as well as the residual fifth metatarsal base.  Unfortunately, the pedorthist is out of the office today and I will schedule him for a follow-up appointment to be fitted for these.  -Dressing applied consisting of medihoney -Wound cleansed and debrided Procedure: Selective Debridement of Wound Rationale: Removal of devitalized tissue from the wound to promote healing.  Pre-Debridement Wound Measurements: 0.6 cm x 0.3 cm x 0.1 cm  Post-Debridement Wound Measurements: same as pre-debridement. Type of Debridement: sharp selective Tissue Removed: Devitalized soft-tissue Dressing: Dry, sterile, compression dressing. Disposition: Patient tolerated procedure well. Patient to return in 1 week for follow-up.   Return in about 1 month (around 07/08/2020) for wound re-check.

## 2020-06-10 ENCOUNTER — Encounter (HOSPITAL_BASED_OUTPATIENT_CLINIC_OR_DEPARTMENT_OTHER): Payer: No Typology Code available for payment source | Admitting: Internal Medicine

## 2020-06-27 ENCOUNTER — Other Ambulatory Visit: Payer: Self-pay

## 2020-06-27 ENCOUNTER — Encounter: Payer: Self-pay | Admitting: Family Medicine

## 2020-06-27 ENCOUNTER — Ambulatory Visit: Payer: No Typology Code available for payment source | Admitting: Family Medicine

## 2020-06-27 VITALS — BP 134/74 | HR 72 | Temp 98.6°F | Ht 72.0 in | Wt 209.0 lb

## 2020-06-27 DIAGNOSIS — R69 Illness, unspecified: Secondary | ICD-10-CM | POA: Diagnosis not present

## 2020-06-27 DIAGNOSIS — M25511 Pain in right shoulder: Secondary | ICD-10-CM

## 2020-06-27 DIAGNOSIS — L97528 Non-pressure chronic ulcer of other part of left foot with other specified severity: Secondary | ICD-10-CM

## 2020-06-27 DIAGNOSIS — F1190 Opioid use, unspecified, uncomplicated: Secondary | ICD-10-CM | POA: Diagnosis not present

## 2020-06-27 DIAGNOSIS — G8929 Other chronic pain: Secondary | ICD-10-CM

## 2020-06-27 DIAGNOSIS — E11621 Type 2 diabetes mellitus with foot ulcer: Secondary | ICD-10-CM | POA: Diagnosis not present

## 2020-06-27 MED ORDER — HYDROCODONE-ACETAMINOPHEN 10-325 MG PO TABS: 1.0000 | ORAL_TABLET | Freq: Four times a day (QID) | ORAL | 0 refills | Status: DC | PRN

## 2020-06-27 MED ORDER — HYDROCODONE-ACETAMINOPHEN 10-325 MG PO TABS: 1.0000 | ORAL_TABLET | Freq: Four times a day (QID) | ORAL | 0 refills | Status: AC | PRN

## 2020-06-27 NOTE — Progress Notes (Signed)
   Subjective:    Patient ID: Robert Huang, male    DOB: 1961/01/12, 59 y.o.   MRN: [id number]  HPI Here for pain management, he is doing well.  Indication for chronic opioid: shoulder and foot pain Medication and dose: Norco 10-325 # pills per month: 120 Last UDS date: 11-19-19 Opioid Treatment Agreement signed (Y/N): 12-10-18 Opioid Treatment Agreement last reviewed with patient:  06-27-20 [facility] reviewed this encounter (include red flags): Yes    Review of Systems     Objective:   Physical Exam        Assessment & Plan:  Pain management, meds were refilled.  Gershon Crane, MD

## 2020-07-11 ENCOUNTER — Other Ambulatory Visit: Payer: Self-pay | Admitting: Family Medicine

## 2020-07-20 ENCOUNTER — Other Ambulatory Visit: Payer: Self-pay | Admitting: Family Medicine

## 2020-07-28 ENCOUNTER — Other Ambulatory Visit: Payer: Self-pay | Admitting: Family Medicine

## 2020-08-13 ENCOUNTER — Telehealth: Payer: No Typology Code available for payment source | Admitting: Nurse Practitioner

## 2020-08-13 DIAGNOSIS — J069 Acute upper respiratory infection, unspecified: Secondary | ICD-10-CM | POA: Diagnosis not present

## 2020-08-13 MED ORDER — FLUTICASONE PROPIONATE 50 MCG/ACT NA SUSP: 2.0000 | Freq: Every day | NASAL | 6 refills | Status: DC

## 2020-08-13 MED ORDER — BENZONATATE 100 MG PO CAPS: 100.0000 mg | ORAL_CAPSULE | Freq: Three times a day (TID) | ORAL | 0 refills | Status: DC | PRN

## 2020-08-13 NOTE — Progress Notes (Signed)
We are sorry you are not feeling well.  Here is how we plan to help!  Based on what you have shared with me, it looks like you may have a viral upper respiratory infection.  Upper respiratory infections are caused by a large number of viruses; however, rhinovirus is the most common cause.   Symptoms vary from person to person, with common symptoms including sore throat, cough, fatigue or lack of energy and feeling of general discomfort.  A low-grade fever of up to 100.4 may present, but is often uncommon.  Symptoms vary however, and are closely related to a person's age or underlying illnesses.  The most common symptoms associated with an upper respiratory infection are nasal discharge or congestion, cough, sneezing, headache and pressure in the ears and face.  These symptoms usually persist for about 3 to 10 days, but can last up to 2 weeks.  It is important to know that upper respiratory infections do not cause serious illness or complications in most cases.    Upper respiratory infections can be transmitted from person to person, with the most common method of transmission being a person's hands.  The virus is able to live on the skin and can infect other persons for up to 2 hours after direct contact.  Also, these can be transmitted when someone coughs or sneezes; thus, it is important to cover the mouth to reduce this risk.  To keep the spread of the illness at bay, good hand hygiene is very important.  This is an infection that is most likely caused by a virus. There are no specific treatments other than to help you with the symptoms until the infection runs its course.  We are sorry you are not feeling well.  Here is how we plan to help!  Providers prescribe antibiotics to treat infections caused by bacteria. Antibiotics are very powerful in treating bacterial infections when they are used properly. To maintain their effectiveness, they should be used only when necessary. Overuse of antibiotics has  resulted in the development of superbugs that are resistant to treatment!    After careful review of your answers, I would not recommend an antibiotic for your condition.  Antibiotics are not effective against viruses and therefore should not be used to treat them. Common examples of infections caused by viruses include colds and flu   For nasal congestion, you may use an oral decongestants such as Mucinex D or if you have glaucoma or high blood pressure use plain Mucinex.  Saline nasal spray or nasal drops can help and can safely be used as often as needed for congestion.  For your congestion, I have prescribed Fluticasone nasal spray one spray in each nostril twice a day  If you do not have a history of heart disease, hypertension, diabetes or thyroid disease, prostate/bladder issues or glaucoma, you may also use Sudafed to treat nasal congestion.  It is highly recommended that you consult with a pharmacist or your primary care physician to ensure this medication is safe for you to take.     If you have a cough, you may use cough suppressants such as Delsym and Robitussin.  If you have glaucoma or high blood pressure, you can also use Coricidin HBP.   For cough I have prescribed for you A prescription cough medication called Tessalon Perles 100 mg. You may take 1-2 capsules every 8 hours as needed for cough  If you have a sore or scratchy throat, use a saltwater gargle-  to  teaspoon of salt dissolved in a 4-ounce to 8-ounce glass of warm water.  Gargle the solution for approximately 15-30 seconds and then spit.  It is important not to swallow the solution.  You can also use throat lozenges/cough drops and Chloraseptic spray to help with throat pain or discomfort.  Warm or cold liquids can also be helpful in relieving throat pain.  For headache, pain or general discomfort, you can use Ibuprofen or Tylenol as directed.   Some authorities believe that zinc sprays or the use of Echinacea may shorten  the course of your symptoms.   HOME CARE . Only take medications as instructed by your medical team. . Be sure to drink plenty of fluids. Water is fine as well as fruit juices, sodas and electrolyte beverages. You may want to stay away from caffeine or alcohol. If you are nauseated, try taking small sips of liquids. How do you know if you are getting enough fluid? Your urine should be a pale yellow or almost colorless. . Get rest. . Taking a steamy shower or using a humidifier may help nasal congestion and ease sore throat pain. You can place a towel over your head and breathe in the steam from hot water coming from a faucet. . Using a saline nasal spray works much the same way. . Cough drops, hard candies and sore throat lozenges may ease your cough. . Avoid close contacts especially the very young and the elderly . Cover your mouth if you cough or sneeze . Always remember to wash your hands.   GET HELP RIGHT AWAY IF: . You develop worsening fever. . If your symptoms do not improve within 10 days . You develop yellow or green discharge from your nose over 3 days. . You have coughing fits . You develop a severe head ache or visual changes. . You develop shortness of breath, difficulty breathing or start having chest pain . Your symptoms persist after you have completed your treatment plan  MAKE SURE YOU   Understand these instructions.  Will watch your condition.  Will get help right away if you are not doing well or get worse.  Your e-visit answers were reviewed by a board certified advanced clinical practitioner to complete your personal care plan. Depending upon the condition, your plan could have included both over the counter or prescription medications. Please review your pharmacy choice. If there is a problem, you may call our nursing hot line at and have the prescription routed to another pharmacy. Your safety is important to [place]. If you have drug allergies check your  prescription carefully.   You can use MyChart to ask questions about today's visit, request a non-urgent call back, or ask for a work or school excuse for 24 hours related to this e-Visit. If it has been greater than 24 hours you will need to follow up with your provider, or enter a new e-Visit to address those concerns. You will get an e-mail in the next two days asking about your experience.  I hope that your e-visit has been valuable and will speed your recovery. Thank you for using e-visits.     5-10 minutes spent reviewing and documenting in chart.

## 2020-08-16 DIAGNOSIS — Z03818 Encounter for observation for suspected exposure to other biological agents ruled out: Secondary | ICD-10-CM | POA: Diagnosis not present

## 2020-08-16 DIAGNOSIS — Z20822 Contact with and (suspected) exposure to covid-19: Secondary | ICD-10-CM | POA: Diagnosis not present

## 2020-08-17 ENCOUNTER — Ambulatory Visit: Payer: No Typology Code available for payment source | Admitting: Family Medicine

## 2020-08-17 ENCOUNTER — Other Ambulatory Visit: Payer: Self-pay

## 2020-08-17 ENCOUNTER — Encounter: Payer: Self-pay | Admitting: Family Medicine

## 2020-08-17 VITALS — BP 116/70 | HR 70 | Temp 98.5°F | Ht 72.01 in | Wt 201.8 lb

## 2020-08-17 DIAGNOSIS — L03116 Cellulitis of left lower limb: Secondary | ICD-10-CM

## 2020-08-17 DIAGNOSIS — L97528 Non-pressure chronic ulcer of other part of left foot with other specified severity: Secondary | ICD-10-CM

## 2020-08-17 DIAGNOSIS — E11621 Type 2 diabetes mellitus with foot ulcer: Secondary | ICD-10-CM | POA: Diagnosis not present

## 2020-08-17 MED ORDER — CEPHALEXIN 500 MG PO CAPS: 500.0000 mg | ORAL_CAPSULE | Freq: Four times a day (QID) | ORAL | 1 refills | Status: AC

## 2020-08-17 MED ORDER — CEFTRIAXONE SODIUM 1 G IJ SOLR
1.0000 g | Freq: Once | INTRAMUSCULAR | Status: AC
  Administered 2020-08-17: 12:00:00 1 g via INTRAMUSCULAR

## 2020-08-17 NOTE — Progress Notes (Signed)
Ceftriaxone

## 2020-08-17 NOTE — Progress Notes (Signed)
   Subjective:    Patient ID: Robert Huang, male    DOB: 1961-02-27, 59 y.o.   MRN: [id number]  HPI Here for another infection beginning in both feet. He noticed this about 6 days ago when he saw a greenish foul smelling DC coming from an ulcer on the lkeft 4th metatarsal head and on the right 2nd toe.    Review of Systems  Constitutional: Negative.   Respiratory: Negative.   Cardiovascular: Negative.   Skin: Positive for wound.       Objective:   Physical Exam Constitutional:      Appearance: Normal appearance.  Cardiovascular:     Rate and Rhythm: Normal rate and regular rhythm.     Pulses: Normal pulses.     Heart sounds: Normal heart sounds.  Pulmonary:     Effort: Pulmonary effort is normal.     Breath sounds: Normal breath sounds.  Skin:    Comments: There are pressure ulcers in the 2 locations above. No DC is seen today. No tenderness   Neurological:     Mental Status: He is alert.           Assessment & Plan:  Cellulitis in diabetic pressure ulcers on both feet. He is given a shot of Rocephin, and we will start him on Keflex 500 mg QID. [facility] will [facility] to be seen [facility]AP.  Gershon Crane, MD

## 2020-08-23 ENCOUNTER — Other Ambulatory Visit: Payer: Self-pay | Admitting: Family Medicine

## 2020-08-28 ENCOUNTER — Other Ambulatory Visit: Payer: Self-pay | Admitting: Family Medicine

## 2020-09-07 ENCOUNTER — Ambulatory Visit: Payer: No Typology Code available for payment source | Admitting: Family Medicine

## 2020-09-07 ENCOUNTER — Encounter: Payer: Self-pay | Admitting: Family Medicine

## 2020-09-07 NOTE — Telephone Encounter (Signed)
Apparently we need to cancel this appt

## 2020-09-14 ENCOUNTER — Other Ambulatory Visit: Payer: Self-pay

## 2020-09-14 ENCOUNTER — Encounter (HOSPITAL_BASED_OUTPATIENT_CLINIC_OR_DEPARTMENT_OTHER): Payer: No Typology Code available for payment source | Attending: Internal Medicine | Admitting: Physician Assistant

## 2020-09-14 DIAGNOSIS — Z89411 Acquired absence of right great toe: Secondary | ICD-10-CM | POA: Insufficient documentation

## 2020-09-14 DIAGNOSIS — E11621 Type 2 diabetes mellitus with foot ulcer: Secondary | ICD-10-CM | POA: Diagnosis not present

## 2020-09-14 DIAGNOSIS — L97522 Non-pressure chronic ulcer of other part of left foot with fat layer exposed: Secondary | ICD-10-CM | POA: Diagnosis not present

## 2020-09-14 DIAGNOSIS — Z955 Presence of coronary angioplasty implant and graft: Secondary | ICD-10-CM | POA: Diagnosis not present

## 2020-09-14 DIAGNOSIS — E1140 Type 2 diabetes mellitus with diabetic neuropathy, unspecified: Secondary | ICD-10-CM | POA: Insufficient documentation

## 2020-09-14 DIAGNOSIS — I2510 Atherosclerotic heart disease of native coronary artery without angina pectoris: Secondary | ICD-10-CM | POA: Diagnosis not present

## 2020-09-14 DIAGNOSIS — L97512 Non-pressure chronic ulcer of other part of right foot with fat layer exposed: Secondary | ICD-10-CM | POA: Insufficient documentation

## 2020-09-14 DIAGNOSIS — I10 Essential (primary) hypertension: Secondary | ICD-10-CM | POA: Insufficient documentation

## 2020-09-15 DIAGNOSIS — L97522 Non-pressure chronic ulcer of other part of left foot with fat layer exposed: Secondary | ICD-10-CM | POA: Diagnosis not present

## 2020-09-15 DIAGNOSIS — I10 Essential (primary) hypertension: Secondary | ICD-10-CM | POA: Diagnosis not present

## 2020-09-15 DIAGNOSIS — E1140 Type 2 diabetes mellitus with diabetic neuropathy, unspecified: Secondary | ICD-10-CM | POA: Diagnosis not present

## 2020-09-15 DIAGNOSIS — L97512 Non-pressure chronic ulcer of other part of right foot with fat layer exposed: Secondary | ICD-10-CM | POA: Diagnosis not present

## 2020-09-15 DIAGNOSIS — E11621 Type 2 diabetes mellitus with foot ulcer: Secondary | ICD-10-CM | POA: Diagnosis not present

## 2020-09-22 ENCOUNTER — Other Ambulatory Visit: Payer: Self-pay

## 2020-09-22 ENCOUNTER — Encounter (HOSPITAL_BASED_OUTPATIENT_CLINIC_OR_DEPARTMENT_OTHER): Payer: No Typology Code available for payment source | Admitting: Internal Medicine

## 2020-09-22 ENCOUNTER — Other Ambulatory Visit: Payer: Self-pay | Admitting: Family Medicine

## 2020-09-22 DIAGNOSIS — Z89411 Acquired absence of right great toe: Secondary | ICD-10-CM | POA: Diagnosis not present

## 2020-09-22 DIAGNOSIS — I2510 Atherosclerotic heart disease of native coronary artery without angina pectoris: Secondary | ICD-10-CM | POA: Diagnosis not present

## 2020-09-22 DIAGNOSIS — E1140 Type 2 diabetes mellitus with diabetic neuropathy, unspecified: Secondary | ICD-10-CM | POA: Diagnosis not present

## 2020-09-22 DIAGNOSIS — L97512 Non-pressure chronic ulcer of other part of right foot with fat layer exposed: Secondary | ICD-10-CM | POA: Diagnosis not present

## 2020-09-22 DIAGNOSIS — L97522 Non-pressure chronic ulcer of other part of left foot with fat layer exposed: Secondary | ICD-10-CM | POA: Diagnosis not present

## 2020-09-22 DIAGNOSIS — E11621 Type 2 diabetes mellitus with foot ulcer: Secondary | ICD-10-CM | POA: Diagnosis not present

## 2020-09-22 DIAGNOSIS — Z955 Presence of coronary angioplasty implant and graft: Secondary | ICD-10-CM | POA: Diagnosis not present

## 2020-09-22 DIAGNOSIS — L97521 Non-pressure chronic ulcer of other part of left foot limited to breakdown of skin: Secondary | ICD-10-CM | POA: Diagnosis not present

## 2020-09-22 DIAGNOSIS — I10 Essential (primary) hypertension: Secondary | ICD-10-CM | POA: Diagnosis not present

## 2020-09-22 NOTE — Telephone Encounter (Signed)
Last office visit-  08/17/2020 Last refill--03/07/2020---90 tabs with 5 refills

## 2020-09-22 NOTE — Progress Notes (Signed)
Robert Huang ([id number]) . Visit Report for 09/22/2020 Debridement Details Patient Name: Date of Service: Robert Huang 09/22/2020 9:00 A M Medical Record Number: [id number] Patient Account Number: [id number] Date of Birth/Sex: Treating RN: 04-16-1961 (60 y.o. Robert Huang, Robert Huang Primary Care Provider: Laurey Huang Other Clinician: Referring Provider: Treating Provider/Extender: Robert Huang Kitchen in Treatment: 1 Debridement Performed for Assessment: Wound #5 Left Metatarsal head fourth Performed By: Physician Robert Huang., MD Debridement Type: Debridement Severity of Tissue Pre Debridement: Limited to breakdown of skin Level of Consciousness (Pre-procedure): Awake and Alert Pre-procedure Verification/Time Out Yes - 09:52 Taken: Start Time: 09:53 Pain Control: Lidocaine 4% Huang opical Solution Huang Area Debrided (L x W): otal 0.5 (cm) x 0.5 (cm) = 0.25 (cm) Tissue and other material debrided: Viable, Non-Viable, Callus, Subcutaneous, Skin: Dermis Level: Skin/Subcutaneous Tissue Debridement Description: Excisional Instrument: Curette Bleeding: Minimum Hemostasis Achieved: Pressure End Time: 09:57 Procedural Pain: 0 Post Procedural Pain: 0 Response to Treatment: Procedure was tolerated well Level of Consciousness (Post- Awake and Alert procedure): Post Debridement Measurements of Total Wound Length: (cm) 0.4 Width: (cm) 0.4 Depth: (cm) 0.1 Volume: (cm) 0.013 Character of Wound/Ulcer Post Debridement: Requires Further Debridement Severity of Tissue Post Debridement: Limited to breakdown of skin Post Procedure Diagnosis Same as Pre-procedure Electronic Signature(s) Signed: 09/22/2020 4:56:03 PM By: Robert Ham MD Signed: 09/22/2020 5:42:01 PM By: Robert Huang Entered By: Robert Huang on 09/22/2020 10:47:56 -------------------------------------------------------------------------------- HPI Details Patient Name: Date of  Service: Robert Huang, Robert Arbour Huang. 09/22/2020 9:00 A M Medical Record Number: [id number] Patient Account Number: [id number] Date of Birth/Sex: Treating RN: 04/06/1961 (60 y.o. Robert Huang Primary Care Provider: Laurey Huang Other Clinician: Referring Provider: Treating Provider/Extender: Robert Huang Kitchen in Treatment: 1 History of Present Illness HPI Description: ADMISSION 05/27/2020 This is a 60 year old man who has type 2 diabetes who is here for wounds on his left foot in 2 locations. The patient has had problems with foot ulcers requiring surgery including a right great toe amputation and a left fifth ray amputation in July 2020 for underlying osteomyelitis although strangely I cannot see any pathology or cultures done in this area. He states that the area on his left fourth met head plantar aspect opened about a month ago. He has had continuous callus in this area. He saw Dr. Sarajane Huang his primary doctor at the beginning of September and was felt to have a cellulitis in the lateral part of his foot in this area he was given a shot of Rocephin and then ongoing antibiotics with Keflex for 10 days. It would appear that he was supposed to follow-up with Dr. Sarajane Huang although I do not see these notes. About a week and a half ago he stubbed his left first toe and has a wound on this area. He is using Neosporin to both wound areas. 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 Sarajane Huang ABI in our clinic was 1.23 on the left 10/8; the patient's area over the left fourth metatarsal head is fully closed. This was a split callus last week that I debrided. He still has an open area over the tip of his left  great toe. He is using a forefoot offloading and silver alginate. We made him an appointment with [facility] to see about custom made diabetic shoes with inserts this appointment is for next Tuesday. Readmission: 09/14/2020 upon evaluation today patient appears to have several wounds over his left dorsal great toe, left fourth metatarsal head, and right second toe. The good news is none of these appear to be severe at this point. Nonetheless unfortunately he is going require a little bit of the debridement today to try to clear away some of the necrotic debris currently. Specifically the left metatarsal head of the fourth metatarsal does look particularly is a wound that is good to require sharp debridement. The patient does have a history of diabetes mellitus type 2, hypertension, and diabetic neuropathy. He has previously been seen here in the clinic and I am not certain that he is ever really have this completely healed although we have not seen him since October 2020. Fortunately his ABIs appear to be doing excellent today. 1/27; patient was readmitted to the clinic last week. He had an area over his right second toe which is healed also of the left dorsal great toe is also healed. He attributes these wounds to a hunting trip he took with boots that perhaps were not helpful on his feet. He still has the area on the fourth met head status post left fifth ray amputation in July 20 Electronic Signature(s) Signed: 09/22/2020 4:56:03 PM By: Robert Ham MD Entered By: Robert Huang on 09/22/2020 10:43:30 -------------------------------------------------------------------------------- Physical Exam Details Patient Name: Date of Service: Robert Huang, Robert Arbour Huang. 09/22/2020 9:00 A M Medical Record Number: [id number] Patient Account Number: [id number] Date of Birth/Sex: Treating RN: 03-10-1961 (60 y.o. Robert Huang Primary Care Provider: Laurey Huang Other Clinician: Referring  Provider: Treating Provider/Extender: Robert Huang Kitchen in Treatment: 1 Constitutional Patient is hypertensive.. Pulse regular and within target range for patient.Robert Huang Kitchen Respirations regular, non-labored and within target range.. Temperature is normal and within the target range for the patient.Robert Huang Kitchen Appears in no distress. Cardiovascular Pedal pulses palpable. Notes Wound exam; all the wounds are healed except the area on the fourth plantar metatarsal head. Thick skin subcutaneous tissue around a pale wound margin I used a #3 curette to remove the skin and subcutaneous tissue to try to get a level surface with the wound and then the wound surface to get healthy granulation. Hemostasis with direct pressure Electronic Signature(s) Signed: 09/22/2020 4:56:03 PM By: Robert Ham MD Entered By: Robert Huang on 09/22/2020 10:46:16 -------------------------------------------------------------------------------- Physician Orders Details Patient Name: Date of Service: Robert Huang, Robert Arbour Huang. 09/22/2020 9:00 A M Medical Record Number: [id number] Patient Account Number: [id number] Date of Birth/Sex: Treating RN: 12-23-1960 (60 y.o. Robert Huang, Robert Huang Primary Care Provider: Laurey Huang Other Clinician: Referring Provider: Treating Provider/Extender: Robert Huang Kitchen in Treatment: 1 Verbal / Phone Orders: No Diagnosis Coding ICD-10 Coding Code Description E11.621 Type 2 diabetes mellitus with foot ulcer L97.522 Non-pressure chronic ulcer of other part of left foot with fat layer exposed L97.512 Non-pressure chronic ulcer of other part of right foot with fat layer exposed E11.40 Type 2 diabetes mellitus with diabetic neuropathy, unspecified I10 Essential (primary) hypertension Follow-up Appointments Return Appointment in 1 week. Bathing/ Shower/ Hygiene May shower and wash wound with soap and water. Off-Loading Wedge shoe to: - left foot to  ambulate Non Wound Condition Protect area with: - pad with foam or gauze  to right 2nd toe. Wound Treatment Wound #5 - Metatarsal head fourth Wound Laterality: Left Cleanser: Soap and Water Every Other Day/30 Days Discharge Instructions: May shower and wash wound with dial antibacterial soap and water prior to dressing change. Prim Dressing: Promogran Prisma Matrix, 4.34 (sq in) (silver collagen) (Dispense As Written) Every Other Day/30 Days ary Discharge Instructions: Moisten collagen with saline or hydrogel Secondary Dressing: Woven Gauze Sponges 2x2 in (Generic) Every Other Day/30 Days Discharge Instructions: Apply over primary dressing as directed. Secondary Dressing: Optifoam Non-Adhesive Dressing, 4x4 in (Generic) Every Other Day/30 Days Discharge Instructions: Apply over primary dressing cut to make foam donut Secured With: Conforming Stretch Gauze Bandage, Sterile 2x75 (in/in) (Generic) Every Other Day/30 Days Discharge Instructions: Secure with stretch gauze as directed. Secured With: Paper Tape, 2x10 (in/yd) (Generic) Every Other Day/30 Days Discharge Instructions: Secure dressing with tape as directed. Consults Podiatry - Refer to [facility] related to Orthotics for Diabetic Shoes with custom inserts. - (ICD10 E11.40 - Type 2 diabetes mellitus with diabetic neuropathy, unspecified) Electronic Signature(s) Signed: 09/22/2020 4:56:03 PM By: Robert Ham MD Signed: 09/22/2020 5:42:01 PM By: Robert Huang Entered By: Robert Huang on 09/22/2020 11:39:04 Prescription 09/22/2020 -------------------------------------------------------------------------------- Camillo Flaming MD Patient Name: Provider: 1961-04-19 [id number] Date of Birth: NPI#: Jerilynn Mages [id number] Sex: DEA #: [phone] [id number] Phone #: License #: [place] Patient Address: [address], [place] [address] [place],  [phone] Allergies No Known Drug Allergies Provider's Orders Podiatry - ICD10: E11.40 - Refer to [facility] related to Orthotics for Diabetic Shoes with custom inserts. Hand Signature: Date(s): Electronic Signature(s) Signed: 09/22/2020 4:56:03 PM By: Robert Ham MD Signed: 09/22/2020 5:42:01 PM By: Robert Huang Entered By: Robert Huang on 09/22/2020 11:39:04 -------------------------------------------------------------------------------- Problem List Details Patient Name: Date of Service: Robert Huang, Robert Arbour Huang. 09/22/2020 9:00 A M Medical Record Number: [id number] Patient Account Number: [id number] Date of Birth/Sex: Treating RN: 1960-09-27 (60 y.o. 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 09/22/2020 9:00 A M Medical Record Number: [id number] Patient Account Number: [id number] Date of Birth/Sex: Treating RN: 01-15-1961 (60 y.o. Robert Huang Primary Care Provider: Laurey Huang Other  Clinician: Referring Provider: Treating Provider/Extender: Robert Huang Kitchen in Treatment: 1 Subjective History of Present Illness (HPI) ADMISSION 05/27/2020 This is a 60 year old man who has type 2 diabetes who is here for wounds on his left foot in 2 locations. The patient has had problems with foot ulcers requiring surgery including a right great toe amputation and a left fifth ray amputation in July 2020 for underlying osteomyelitis although strangely I cannot see any pathology or cultures done in this area.  He states that the area on his left fourth met head plantar aspect opened about a month ago. He has had continuous callus in this area. He saw Dr. Sarajane Huang his primary doctor at the beginning of September and was felt to have a cellulitis in the lateral part of his foot in this area he was given a shot of Rocephin and then ongoing antibiotics with Keflex for 10 days. It would appear that he was supposed to follow-up with Dr. Sarajane Huang although I do not see these notes. About a week and a half ago he stubbed his left first toe and has a wound on this area. He is using Neosporin to both wound areas. 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 Sarajane Huang ABI in our clinic was 1.23 on the left 10/8; the patient's area over the left fourth metatarsal head is fully closed. This was a split callus last week that I debrided. He still has an open area over the tip of his left great toe. He is using a forefoot offloading and silver alginate. We made him an appointment [facility] to see about custom made diabetic shoes with inserts this appointment is for next  Tuesday. Readmission: 09/14/2020 upon evaluation today patient appears to have several wounds over his left dorsal great toe, left fourth metatarsal head, and right second toe. The good news is none of these appear to be severe at this point. Nonetheless unfortunately he is going require a little bit of the debridement today to try to clear away some of the necrotic debris currently. Specifically the left metatarsal head of the fourth metatarsal does look particularly is a wound that is good to require sharp debridement. The patient does have a history of diabetes mellitus type 2, hypertension, and diabetic neuropathy. He has previously been seen here in the clinic and I am not certain that he is ever really have this completely healed although we have not seen him since October 2020. Fortunately his ABIs appear to be doing excellent today. 1/27; patient was readmitted to the clinic last week. He had an area over his right second toe which is healed also of the left dorsal great toe is also healed. He attributes these wounds to a hunting trip he took with boots that perhaps were not helpful on his feet. He still has the area on the fourth met head status post left fifth ray amputation in July 20 Objective Constitutional Patient is hypertensive.. Pulse regular and within target range for patient.Robert Huang Kitchen Respirations regular, non-labored and within target range.. Temperature is normal and within the target range for the patient.Robert Huang Kitchen Appears in no distress. Vitals Time Taken: 9:06 AM, Height: 72 in, Weight: 205 lbs, BMI: 27.8, Temperature: 97.7 F, Pulse: 56 bpm, Respiratory Rate: 16 breaths/min, Blood Pressure: 143/79 mmHg, Capillary Blood Glucose: 122 mg/dl. Cardiovascular Pedal pulses palpable. General Notes: Wound exam; all the wounds are healed except the area on the fourth plantar metatarsal head. Thick skin subcutaneous tissue around a pale wound margin I used a #3 curette to remove the skin and  subcutaneous tissue to try to get a level surface with the wound and then the wound surface to get healthy granulation. Hemostasis with  direct pressure Integumentary (Hair, Skin) Wound #4 status is Open. Original cause of wound was Trauma. The wound is located on the Left,Dorsal Huang Great. The wound measures 0cm length x 0cm oe width x 0cm depth; 0cm^2 area and 0cm^3 volume. There is no tunneling or undermining noted. There is a none present amount of drainage noted. The wound margin is flat and intact. There is no granulation within the wound bed. There is no necrotic tissue within the wound bed. Wound #5 status is Open. Original cause of wound was Shear/Friction. The wound is located on the Left Metatarsal head fourth. The wound measures 0.4cm length x 0.4cm width x 0.1cm depth; 0.126cm^2 area and 0.013cm^3 volume. There is Fat Layer (Subcutaneous Tissue) exposed. There is no tunneling or undermining noted. There is a medium amount of serosanguineous drainage noted. The wound margin is well defined and not attached to the wound base. There is no granulation within the wound bed. There is a large (67-100%) amount of necrotic tissue within the wound bed including Adherent Slough. Wound #6 status is Open. Original cause of wound was Shear/Friction. The wound is located on the Right Huang Second. The wound measures 0cm length x 0cm oe width x 0cm depth; 0cm^2 area and 0cm^3 volume. There is no tunneling or undermining noted. There is a none present amount of drainage noted. The wound margin is flat and intact. There is no granulation within the wound bed. There is no necrotic tissue within the wound bed. Assessment Active Problems ICD-10 Type 2 diabetes mellitus with foot ulcer Non-pressure chronic ulcer of other part of left foot with fat layer exposed Non-pressure chronic ulcer of other part of right foot with fat layer exposed Type 2 diabetes mellitus with diabetic neuropathy, unspecified Essential  (primary) hypertension Procedures Wound #5 Pre-procedure diagnosis of Wound #5 is a Diabetic Wound/Ulcer of the Lower Extremity located on the Left Metatarsal head fourth .Severity of Tissue Pre Debridement is: Limited to breakdown of skin. There was a Selective/Open Wound Skin/Dermis Debridement with a total area of 0.25 sq cm performed by Robert Huang., MD. With the following instrument(s): Curette to remove Viable and Non-Viable tissue/material. Material removed includes Callus and Skin: Dermis and after achieving pain control using Lidocaine 4% Topical Solution. A time out was conducted at 09:52, prior to the start of the procedure. A Minimum amount of bleeding was controlled with Pressure. The procedure was tolerated well with a pain level of 0 throughout and a pain level of 0 following the procedure. Post Debridement Measurements: 0.4cm length x 0.4cm width x 0.1cm depth; 0.013cm^3 volume. Character of Wound/Ulcer Post Debridement requires further debridement. Severity of Tissue Post Debridement is: Limited to breakdown of skin. Post procedure Diagnosis Wound #5: Same as Pre-Procedure Plan Follow-up Appointments: Return Appointment in 1 week. Bathing/ Shower/ Hygiene: May shower and wash wound with soap and water. Off-Loading: Wedge shoe to: - left foot to ambulate Non Wound Condition: Protect area with: - pad with foam or gauze to right 2nd toe. WOUND #5: - Metatarsal head fourth Wound Laterality: Left Cleanser: Soap and Water Every Other Day/30 Days Discharge Instructions: May shower and wash wound with dial antibacterial soap and water prior to dressing change. Prim Dressing: Promogran Prisma Matrix, 4.34 (sq in) (silver collagen) (Dispense As Written) Every Other Day/30 Days ary Discharge Instructions: Moisten collagen with saline or hydrogel Secondary Dressing: Woven Gauze Sponges 2x2 in (Generic) Every Other Day/30 Days Discharge Instructions: Apply over primary dressing  as directed. Secondary Dressing:  Optifoam Non-Adhesive Dressing, 4x4 in (Generic) Every Other Day/30 Days Discharge Instructions: Apply over primary dressing cut to make foam donut Secured With: Conforming Stretch Gauze Bandage, Sterile 2x75 (in/in) (Generic) Every Other Day/30 Days Discharge Instructions: Secure with stretch gauze as directed. Secured With: Paper Huang ape, 2x10 (in/yd) (Generic) Every Other Day/30 Days Discharge Instructions: Secure dressing with tape as directed. 1. I have continued with Prisma post debridement. 2. Everything else is closed. Advised him to pad the right second toe which has a tight hammer deformity to keep it from rubbing on the dorsal aspect of his foot wear. 3. Continue using a forefoot offloading shoe Electronic Signature(s) Signed: 09/22/2020 4:56:03 PM By: Robert Ham MD Entered By: Robert Huang on 09/22/2020 10:47:31 -------------------------------------------------------------------------------- SuperBill Details Patient Name: Date of Service: Robert Huang, Robert Huang 09/22/2020 Medical Record Number: [id number] Patient Account Number: [id number] Date of Birth/Sex: Treating RN: [birth year] (60 y.o. Robert Huang, Robert Huang Primary Care Provider: Laurey Huang Other Clinician: Referring Provider: Treating Provider/Extender: Robert Huang Kitchen in Treatment: 1 Diagnosis Coding ICD-10 Codes Code Description [phone] Type 2 diabetes mellitus with foot ulcer L97.522 Non-pressure chronic ulcer of other part of left foot with fat layer exposed L97.512 Non-pressure chronic ulcer of other part of right foot with fat layer exposed E11.40 Type 2 diabetes mellitus with diabetic neuropathy, unspecified I10 Essential (primary) hypertension Facility Procedures CPT4 Code: [id number] Description: 61254 - DEB SUBQ TISSUE 20 SQ CM/< ICD-10 Diagnosis Description L97.522 Non-pressure chronic ulcer of other part of left foot with fat layer  exposed Modifier: Quantity: 1 Physician Procedures : CPT4 Code Description Modifier [id number] - WC PHYS SUBQ TISS 20 SQ CM ICD-10 Diagnosis Description L97.522 Non-pressure chronic ulcer of other part of left foot with fat layer exposed Quantity: 1 Electronic Signature(s) Signed: 09/22/2020 4:56:03 PM By: Robert Ham MD Entered By: Robert Huang on 09/22/2020 10:48:24

## 2020-09-22 NOTE — Progress Notes (Signed)
AASHIR, UMHOLTZ T ([id number]) . Visit Report for 09/22/2020 Arrival Information Details Patient Name: Date of Service: Robert Huang 09/22/2020 9:00 A M Medical Record Number: [id number] Patient Account Number: [id number] Date of Birth/Sex: Treating RN: 25-Jan-1961 (60 y.o. Harlon Flor, Yvonne Kendall Primary Care Dustee Bottenfield: [name] Other Clinician: Referring Jakyia Gaccione: Treating Aowyn Rozeboom/Extender: Doran Stabler in Treatment: 1 Visit Information History Since Last Visit Added or deleted any medications: No Patient Arrived: Ambulatory Any new allergies or adverse reactions: No Arrival Time: 09:04 Had a fall or experienced change in No Accompanied By: self activities of daily living that may affect Transfer Assistance: None risk of falls: Patient Identification Verified: Yes Signs or symptoms of abuse/neglect since last visito No Secondary Verification Process Completed: Yes Hospitalized since last visit: No Patient Requires Transmission-Based Precautions: No Implantable device outside of the clinic excluding No Patient Has Alerts: Yes cellular tissue based products placed in the center Patient Alerts: R ABI: 0.96 since last visit: L ABI: 1.19 Has Dressing in Place as Prescribed: Yes Pain Present Now: No Electronic Signature(s) Signed: 09/22/2020 3:17:01 PM By: Karl Ito Entered By: Karl Ito on 09/22/2020 09:06:39 -------------------------------------------------------------------------------- Encounter Discharge Information Details Patient Name: Date of Service: Robert Huang, Robert Debar T. 09/22/2020 9:00 A M Medical Record Number: [id number] Patient Account Number: [id number] Date of Birth/Sex: Treating RN: 01/12/1961 (60 y.o. [name] Primary Care Dewight Catino: [name] Other Clinician: Referring Florette Thai: Treating Ashlley Booher/Extender: Doran Stabler in Treatment: 1 Encounter Discharge Information Items  Post Procedure Vitals Discharge Condition: Stable Temperature (F): 97.7 Ambulatory Status: Ambulatory Pulse (bpm): 56 Discharge Destination: Home Respiratory Rate (breaths/min): 18 Transportation: Private Auto Blood Pressure (mmHg): 143/79 Accompanied By: self Schedule Follow-up Appointment: Yes Clinical Summary of Care: Patient Declined Electronic Signature(s) Signed: 09/22/2020 5:45:53 PM By: [name] RN, BSN Entered By: [name] on 09/22/2020 10:18:15 -------------------------------------------------------------------------------- Lower Extremity Assessment Details Patient Name: Date of Service: Robert Huang, Robert Debar T. 09/22/2020 9:00 A M Medical Record Number: [id number] Patient Account Number: [id number] Date of Birth/Sex: Treating RN: April 20, 1961 (60 y.o. Elizebeth Koller Primary Care Delora Gravatt: [name] Other Clinician: Referring Chimamanda Siegfried: Treating [place]/Extender: Doran Stabler in Treatment: 1 Edema Assessment Assessed: Kyra Searles: No] Franne Forts: No] Edema: [Left: Yes] [Right: Yes] Calf Left: Right: Point of Measurement: 35 cm From Medial Instep 37.2 cm 33 cm Ankle Left: Right: Point of Measurement: 11 cm From Medial Instep 22 cm 22 cm Vascular Assessment Pulses: Dorsalis Pedis Palpable: [Left:Yes] [Right:Yes] Electronic Signature(s) Signed: 09/22/2020 5:48:06 PM By: Zandra Abts RN, BSN Entered By: Zandra Abts on 09/22/2020 09:18:22 -------------------------------------------------------------------------------- Multi Wound Chart Details Patient Name: Date of Service: Robert Huang, Robert Debar T. 09/22/2020 9:00 A M Medical Record Number: [id number] Patient Account Number: [id number] Date of Birth/Sex: Treating RN: October 04, 1960 (60 y.o. Harlon Flor, Yvonne Kendall Primary Care Madalaine Portier: [name] Other Clinician: Referring Waymond Meador: Treating Robin Petrakis/Extender: Doran Stabler in Treatment: 1 Vital  Signs Height(in): 72 Capillary Blood Glucose(mg/dl): 751 Weight(lbs): 700 Pulse(bpm): 56 Body Mass Index(BMI): 28 Blood Pressure(mmHg): 143/79 Temperature(F): 97.7 Respiratory Rate(breaths/min): 16 Photos: [4:No Photos Left, Dorsal T Great oe] [5:No Photos Left Metatarsal head fourth] [6:No Photos Right T Second oe] Wound Location: [4:Trauma] [5:Shear/Friction] [6:Shear/Friction] Wounding Event: [4:Diabetic Wound/Ulcer of the Lower] [5:Diabetic Wound/Ulcer of the Lower] [6:Diabetic Wound/Ulcer of the Lower] Primary Etiology: [4:Extremity Coronary Artery Disease,] [5:Extremity Coronary Artery Disease,] [6:Extremity Coronary Artery Disease,] Comorbid History: [4:Hypertension, Type  II Diabetes, Gout 09/09/2020] [5:Hypertension, Type II Diabetes, Gout 08/29/2020] [6:Hypertension, Type II Diabetes, Gout 06/27/2020] Date Acquired: [4:1] [5:1] [6:1] Weeks of Treatment: [4:Open] [5:Open] [6:Open] Wound Status: [4:0x0x0] [5:0.4x0.4x0.1] [6:0x0x0] Measurements L x W x D (cm) [4:0] [5:0.126] [6:0] A (cm) : rea [4:0] [5:0.013] [6:0] Volume (cm) : [4:100.00%] [5:96.40%] [6:100.00%] % Reduction in A rea: [4:100.00%] [5:98.20%] [6:100.00%] % Reduction in Volume: [4:Grade 2] [5:Grade 2] [6:Grade 2] Classification: [4:None Present] [5:Medium] [6:None Present] Exudate A mount: [4:N/A] [5:Serosanguineous] [6:N/A] Exudate Type: [4:N/A] [5:red, brown] [6:N/A] Exudate Color: [4:Flat and Intact] [5:Well defined, not attached] [6:Flat and Intact] Wound Margin: [4:None Present (0%)] [5:None Present (0%)] [6:None Present (0%)] Granulation A mount: [4:None Present (0%)] [5:Large (67-100%)] [6:None Present (0%)] Necrotic A mount: [4:Fascia: No] [5:Fat Layer (Subcutaneous Tissue): Yes Fascia: No] Exposed Structures: [4:Fat Layer (Subcutaneous Tissue): No Tendon: No Muscle: No Joint: No Bone: No Large (67-100%)] [5:Fascia: No Tendon: No Muscle: No Joint: No Bone: No Large (67-100%)] [6:Fat Layer (Subcutaneous  Tissue): No Tendon: No Muscle: No Joint: No Bone: No Large  (67-100%)] Epithelialization: [4:N/A] [5:Debridement - Selective/Open Wound N/A] Debridement: Pre-procedure Verification/Time Out N/A [5:09:52] [6:N/A] Taken: [4:N/A] [5:Lidocaine 4% Topical Solution] [6:N/A] Pain Control: [4:N/A] [5:Callus] [6:N/A] Tissue Debrided: [4:N/A] [5:Skin/Dermis] [6:N/A] Level: [4:N/A] [5:0.25] [6:N/A] Debridement A (sq cm): [4:rea N/A] [5:Curette] [6:N/A] Instrument: [4:N/A] [5:Minimum] [6:N/A] Bleeding: [4:N/A] [5:Pressure] [6:N/A] Hemostasis A chieved: [4:N/A] [5:0] [6:N/A] Procedural Pain: [4:N/A] [5:0] [6:N/A] Post Procedural Pain: [4:N/A] [5:Procedure was tolerated well] [6:N/A] Debridement Treatment Response: [4:N/A] [5:0.4x0.4x0.1] [6:N/A] Post Debridement Measurements L x W x D (cm) [4:N/A] [5:0.013] [6:N/A] Post Debridement Volume: (cm) [4:N/A] [5:Debridement] [6:N/A] Treatment Notes Wound #4 (Toe Great) Wound Laterality: Dorsal, Left Cleanser Peri-Wound Care Topical Primary Dressing Secondary Dressing Secured With Compression Wrap Compression Stockings Add-Ons Wound #5 (Metatarsal head fourth) Wound Laterality: Left Cleanser Soap and Water Discharge Instruction: May shower and wash wound with dial antibacterial soap and water prior to dressing change. Peri-Wound Care Topical Primary Dressing Promogran Prisma Matrix, 4.34 (sq in) (silver collagen) Discharge Instruction: Moisten collagen with saline or hydrogel Secondary Dressing Woven Gauze Sponges 2x2 in Discharge Instruction: Apply over primary dressing as directed. Optifoam Non-Adhesive Dressing, 4x4 in Discharge Instruction: Apply over primary dressing cut to make foam donut Secured With Conforming Stretch Gauze Bandage, Sterile 2x75 (in/in) Discharge Instruction: Secure with stretch gauze as directed. Paper Tape, 2x10 (in/yd) Discharge Instruction: Secure dressing with tape as directed. Compression Wrap Compression  Stockings Add-Ons Wound #6 (Toe Second) Wound Laterality: Right Cleanser Peri-Wound Care Topical Primary Dressing Secondary Dressing Secured With Compression Wrap Compression Stockings Add-Ons Electronic Signature(s) Signed: 09/22/2020 4:56:03 PM By: Baltazar Najjarobson, Michael MD Signed: 09/22/2020 5:42:01 PM By: Shawn Stalleaton, Bobbi Entered By: Baltazar Najjarobson, Michael on 09/22/2020 10:41:47 -------------------------------------------------------------------------------- Multi-Disciplinary Care Plan Details Patient Name: Date of Service: Robert SacramentoGRINDSTA FF, Robert DebarA LA N T. 09/22/2020 9:00 A M Medical Record Number: [id number] Patient Account Number: [id number] Date of Birth/Sex: Treating RN: [birth year] (59 y.o. Tammy SoursM) Deaton, Bobbi Primary Care Garvey Westcott: Nelwyn SalisburyFry, Stephen A Other Clinician: Referring Kearia Yin: Treating Malkie Wille/Extender: Doran Stablerobson, Michael Fry, Stephen A Weeks in Treatment: 1 Active Inactive Abuse / Safety / Falls / Self Care Management Nursing Diagnoses: Potential for falls Goals: Patient/caregiver will verbalize/demonstrate measures taken to prevent injury and/or falls Date Initiated: 09/14/2020 Target Resolution Date: 10/12/2020 Goal Status: Active Interventions: Assess fall risk on admission and as needed Assess impairment of mobility on admission and as needed per policy Notes: Nutrition Nursing Diagnoses: Impaired glucose control: actual or potential Potential for alteratiion in Nutrition/Potential for imbalanced  nutrition Goals: Patient/caregiver will maintain therapeutic glucose control Date Initiated: 09/14/2020 Target Resolution Date: 10/12/2020 Goal Status: Active Interventions: Assess patient nutrition upon admission and as needed per policy Provide education on elevated blood sugars and impact on wound healing Treatment Activities: Patient referred to Primary Care Physician for further nutritional evaluation : 09/14/2020 Notes: Wound/Skin Impairment Nursing Diagnoses: Impaired tissue  integrity Knowledge deficit related to ulceration/compromised skin integrity Goals: Patient/caregiver will verbalize understanding of skin care regimen Date Initiated: 09/14/2020 Target Resolution Date: 10/12/2020 Goal Status: Active Ulcer/skin breakdown will have a volume reduction of 30% by week 4 Date Initiated: 09/14/2020 Target Resolution Date: 10/12/2020 Goal Status: Active Interventions: Assess patient/caregiver ability to obtain necessary supplies Assess patient/caregiver ability to perform ulcer/skin care regimen upon admission and as needed Assess ulceration(s) every visit Treatment Activities: Topical wound management initiated : 09/14/2020 Notes: Electronic Signature(s) Signed: 09/22/2020 5:42:01 PM By: Shawn Stall Entered By: Shawn Stall on 09/22/2020 09:34:48 -------------------------------------------------------------------------------- Pain Assessment Details Patient Name: Date of Service: Robert Huang, Robert Debar T. 09/22/2020 9:00 A M Medical Record Number: [id number] Patient Account Number: [id number] Date of Birth/Sex: Treating RN: [birth year] (61 y.o. Harlon Flor, Yvonne Kendall Primary Care Destyne Goodreau: [name] Other Clinician: Referring Garlon Tuggle: Treating Nazaiah Navarrete/Extender: Doran Stabler in Treatment: 1 Active Problems Location of Pain Severity and Description of Pain Patient Has Paino No Site Locations Pain Management and Medication Current Pain Management: Electronic Signature(s) Signed: 09/22/2020 3:17:01 PM By: Karl Ito Signed: 09/22/2020 5:42:01 PM By: Shawn Stall Entered By: Karl Ito on 09/22/2020 09:07:05 -------------------------------------------------------------------------------- Patient/Caregiver Education Details Patient Name: Date of Service: Robert Huang 1/27/2022andnbsp9:00 A M Medical Record Number: [id number] Patient Account Number: [id number] Date of Birth/Gender: Treating RN: 1960/09/07 (60  y.o. [name] Primary Care Physician: [name] Other Clinician: Referring Physician: Treating Physician/Extender: Doran Stabler in Treatment: 1 Education Assessment Education Provided To: Patient Education Topics Provided Elevated Blood Sugar/ Impact on Healing: Handouts: Elevated Blood Sugars: How Do They Affect Wound Healing Methods: Explain/Verbal Responses: Reinforcements needed Electronic Signature(s) Signed: 09/22/2020 5:42:01 PM By: Shawn Stall Entered By: Shawn Stall on 09/22/2020 09:35:12 -------------------------------------------------------------------------------- Wound Assessment Details Patient Name: Date of Service: Robert Cook T. 09/22/2020 9:00 A M Medical Record Number: [id number] Patient Account Number: [id number] Date of Birth/Sex: Treating RN: Jan 08, 1961 (60 y.o. Harlon Flor, Millard.Loa Primary Care Dollene Mallery: [name] Other Clinician: Referring Myka Lukins: Treating Waris Rodger/Extender: Doran Stabler in Treatment: 1 Wound Status Wound Number: 4 Primary Diabetic Wound/Ulcer of the Lower Extremity Etiology: Wound Location: Left, Dorsal T Great oe Wound Status: Open Wounding Event: Trauma Comorbid Coronary Artery Disease, Hypertension, Type II Diabetes, Date Acquired: 09/09/2020 History: Gout Weeks Of Treatment: 1 Clustered Wound: No Wound Measurements Length: (cm) Width: (cm) Depth: (cm) Area: (cm) Volume: (cm) 0 % Reduction in Area: 100% 0 % Reduction in Volume: 100% 0 Epithelialization: Large (67-100%) 0 Tunneling: No 0 Undermining: No Wound Description Classification: Grade 2 Wound Margin: Flat and Intact Exudate Amount: None Present Foul Odor After Cleansing: No Slough/Fibrino No Wound Bed Granulation Amount: None Present (0%) Exposed Structure Necrotic Amount: None Present (0%) Fascia Exposed: No Fat Layer (Subcutaneous Tissue) Exposed: No Tendon Exposed:  No Muscle Exposed: No Joint Exposed: No Bone Exposed: No Electronic Signature(s) Signed: 09/22/2020 5:42:01 PM By: Shawn Stall Signed: 09/22/2020 5:48:06 PM By: Zandra Abts RN, BSN Entered By: Zandra Abts on 09/22/2020 09:18:47 -------------------------------------------------------------------------------- Wound Assessment Details Patient Name: Date of  Service: Robert Huang, [name] 09/22/2020 9:00 A M Medical Record Number: [id number] Patient Account Number: [id number] Date of Birth/Sex: Treating RN: 10-Sep-1960 (60 y.o. Harlon Flor, Millard.Loa Primary Care Owin Vignola: [name] Other Clinician: Referring Joniyah Mallinger: Treating Rosana Farnell/Extender: Doran Stabler in Treatment: 1 Wound Status Wound Number: 5 Primary Diabetic Wound/Ulcer of the Lower Extremity Etiology: Wound Location: Left Metatarsal head fourth Wound Status: Open Wounding Event: Shear/Friction Comorbid Coronary Artery Disease, Hypertension, Type II Diabetes, Date Acquired: 08/29/2020 History: Gout Weeks Of Treatment: 1 Clustered Wound: No Wound Measurements Length: (cm) 0.4 Width: (cm) 0.4 Depth: (cm) 0.1 Area: (cm) 0.126 Volume: (cm) 0.013 % Reduction in Area: 96.4% % Reduction in Volume: 98.2% Epithelialization: Large (67-100%) Tunneling: No Undermining: No Wound Description Classification: Grade 2 Wound Margin: Well defined, not attached Exudate Amount: Medium Exudate Type: Serosanguineous Exudate Color: red, brown Foul Odor After Cleansing: No Slough/Fibrino Yes Wound Bed Granulation Amount: None Present (0%) Exposed Structure Necrotic Amount: Large (67-100%) Fascia Exposed: No Necrotic Quality: Adherent Slough Fat Layer (Subcutaneous Tissue) Exposed: Yes Tendon Exposed: No Muscle Exposed: No Joint Exposed: No Bone Exposed: No Treatment Notes Wound #5 (Metatarsal head fourth) Wound Laterality: Left Cleanser Soap and Water Discharge Instruction: May shower and  wash wound with dial antibacterial soap and water prior to dressing change. Peri-Wound Care Topical Primary Dressing Promogran Prisma Matrix, 4.34 (sq in) (silver collagen) Discharge Instruction: Moisten collagen with saline or hydrogel Secondary Dressing Woven Gauze Sponges 2x2 in Discharge Instruction: Apply over primary dressing as directed. Optifoam Non-Adhesive Dressing, 4x4 in Discharge Instruction: Apply over primary dressing cut to make foam donut Secured With Conforming Stretch Gauze Bandage, Sterile 2x75 (in/in) Discharge Instruction: Secure with stretch gauze as directed. Paper Tape, 2x10 (in/yd) Discharge Instruction: Secure dressing with tape as directed. Compression Wrap Compression Stockings Add-Ons Electronic Signature(s) Signed: 09/22/2020 5:42:01 PM By: Shawn Stall Signed: 09/22/2020 5:48:06 PM By: Zandra Abts RN, BSN Entered By: Zandra Abts on 09/22/2020 09:19:03 -------------------------------------------------------------------------------- Wound Assessment Details Patient Name: Date of Service: Robert Huang, Robert Debar T. 09/22/2020 9:00 A M Medical Record Number: [id number] Patient Account Number: [id number] Date of Birth/Sex: Treating RN: Jan 11, 1961 (59 y.o. Harlon Flor, Millard.Loa Primary Care Akera Snowberger: [name] Other Clinician: Referring Damain Broadus: Treating Dajah Fischman/Extender: Doran Stabler in Treatment: 1 Wound Status Wound Number: 6 Primary Diabetic Wound/Ulcer of the Lower Extremity Etiology: Wound Location: Right T Second oe Wound Status: Open Wounding Event: Shear/Friction Comorbid Coronary Artery Disease, Hypertension, Type II Diabetes, Date Acquired: 06/27/2020 History: Gout Weeks Of Treatment: 1 Clustered Wound: No Wound Measurements Length: (cm) Width: (cm) Depth: (cm) Area: (cm) Volume: (cm) 0 % Reduction in Area: 100% 0 % Reduction in Volume: 100% 0 Epithelialization: Large (67-100%) 0 Tunneling: No 0  Undermining: No Wound Description Classification: Grade 2 Wound Margin: Flat and Intact Exudate Amount: None Present Foul Odor After Cleansing: No Slough/Fibrino No Wound Bed Granulation Amount: None Present (0%) Exposed Structure Necrotic Amount: None Present (0%) Fascia Exposed: No Fat Layer (Subcutaneous Tissue) Exposed: No Tendon Exposed: No Muscle Exposed: No Joint Exposed: No Bone Exposed: No Electronic Signature(s) Signed: 09/22/2020 5:42:01 PM By: Shawn Stall Signed: 09/22/2020 5:48:06 PM By: Zandra Abts RN, BSN Entered By: Zandra Abts on 09/22/2020 09:19:21 -------------------------------------------------------------------------------- Vitals Details Patient Name: Date of Service: Robert Huang, Robert Debar T. 09/22/2020 9:00 A M Medical Record Number: [id number] Patient Account Number: [id number] Date of Birth/Sex: Treating RN: 03-16-1961 (59 y.o. [name] Primary Care Oriel Ojo: Clent Ridges,  Tera Mater Other Clinician: Referring Pleasant Bensinger: Treating Ronit Marczak/Extender: Doran Stabler in Treatment: 1 Vital Signs Time Taken: 09:06 Temperature (F): 97.7 Height (in): 72 Pulse (bpm): 56 Weight (lbs): 205 Respiratory Rate (breaths/min): 16 Body Mass Index (BMI): 27.8 Blood Pressure (mmHg): 143/79 Capillary Blood Glucose (mg/dl): 272 Reference Range: 80 - 120 mg / dl Electronic Signature(s) Signed: 09/22/2020 3:17:01 PM By: Karl Ito Entered By: Karl Ito on 09/22/2020 09:07:00

## 2020-09-28 ENCOUNTER — Other Ambulatory Visit: Payer: Self-pay

## 2020-09-28 ENCOUNTER — Encounter: Payer: Self-pay | Admitting: Family Medicine

## 2020-09-28 ENCOUNTER — Ambulatory Visit: Payer: No Typology Code available for payment source | Admitting: Family Medicine

## 2020-09-28 VITALS — BP 140/60 | HR 59 | Temp 98.9°F | Resp 18 | Wt 205.0 lb

## 2020-09-28 DIAGNOSIS — G8929 Other chronic pain: Secondary | ICD-10-CM | POA: Diagnosis not present

## 2020-09-28 DIAGNOSIS — F1190 Opioid use, unspecified, uncomplicated: Secondary | ICD-10-CM | POA: Diagnosis not present

## 2020-09-28 DIAGNOSIS — E118 Type 2 diabetes mellitus with unspecified complications: Secondary | ICD-10-CM

## 2020-09-28 DIAGNOSIS — N138 Other obstructive and reflux uropathy: Secondary | ICD-10-CM | POA: Diagnosis not present

## 2020-09-28 DIAGNOSIS — N401 Enlarged prostate with lower urinary tract symptoms: Secondary | ICD-10-CM

## 2020-09-28 DIAGNOSIS — M25511 Pain in right shoulder: Secondary | ICD-10-CM

## 2020-09-28 DIAGNOSIS — R69 Illness, unspecified: Secondary | ICD-10-CM | POA: Diagnosis not present

## 2020-09-28 LAB — CBC WITH DIFFERENTIAL/PLATELET
Basophils Absolute: 0.1 10*3/uL (ref 0.0–0.1)
Basophils Relative: 1.2 % (ref 0.0–3.0)
Eosinophils Absolute: 0.2 10*3/uL (ref 0.0–0.7)
Eosinophils Relative: 3.7 % (ref 0.0–5.0)
HCT: 37.3 % — ABNORMAL LOW (ref 39.0–52.0)
Hemoglobin: 12.3 g/dL — ABNORMAL LOW (ref 13.0–17.0)
Lymphocytes Relative: 13.5 % (ref 12.0–46.0)
Lymphs Abs: 0.6 10*3/uL — ABNORMAL LOW (ref 0.7–4.0)
MCHC: 33.1 g/dL (ref 30.0–36.0)
MCV: 92.4 fl (ref 78.0–100.0)
Monocytes Absolute: 0.5 10*3/uL (ref 0.1–1.0)
Monocytes Relative: 11.5 % (ref 3.0–12.0)
Neutro Abs: 3.2 10*3/uL (ref 1.4–7.7)
Neutrophils Relative %: 70.1 % (ref 43.0–77.0)
Platelets: 216 10*3/uL (ref 150.0–400.0)
RBC: 4.04 Mil/uL — ABNORMAL LOW (ref 4.22–5.81)
RDW: 14.4 % (ref 11.5–15.5)
WBC: 4.6 10*3/uL (ref 4.0–10.5)

## 2020-09-28 LAB — HEPATIC FUNCTION PANEL
ALT: 16 U/L (ref 0–53)
AST: 20 U/L (ref 0–37)
Albumin: 3.9 g/dL (ref 3.5–5.2)
Alkaline Phosphatase: 49 U/L (ref 39–117)
Bilirubin, Direct: 0.1 mg/dL (ref 0.0–0.3)
Total Bilirubin: 0.3 mg/dL (ref 0.2–1.2)
Total Protein: 6.7 g/dL (ref 6.0–8.3)

## 2020-09-28 LAB — LIPID PANEL
Cholesterol: 170 mg/dL (ref 0–200)
HDL: 42 mg/dL (ref >39.00)
NonHDL: 128.21
Total CHOL/HDL Ratio: 4
Triglycerides: 305 mg/dL — ABNORMAL HIGH (ref 0.0–149.0)
VLDL: 61 mg/dL — ABNORMAL HIGH (ref 0.0–40.0)

## 2020-09-28 LAB — BASIC METABOLIC PANEL
BUN: 23 mg/dL (ref 6–23)
CO2: 33 mEq/L — ABNORMAL HIGH (ref 19–32)
Calcium: 9.7 mg/dL (ref 8.4–10.5)
Chloride: 102 mEq/L (ref 96–112)
Creatinine, Ser: 1.5 mg/dL (ref 0.40–1.50)
GFR: 50.19 mL/min — ABNORMAL LOW (ref >60.00)
Glucose, Bld: 123 mg/dL — ABNORMAL HIGH (ref 70–99)
Potassium: 4.1 mEq/L (ref 3.5–5.1)
Sodium: 141 mEq/L (ref 135–145)

## 2020-09-28 LAB — TSH: TSH: 1.66 u[IU]/mL (ref 0.35–4.50)

## 2020-09-28 LAB — HEMOGLOBIN A1C: Hgb A1c MFr Bld: 7.5 % — ABNORMAL HIGH (ref 4.6–6.5)

## 2020-09-28 LAB — PSA: PSA: 1.02 ng/mL (ref 0.10–4.00)

## 2020-09-28 LAB — LDL CHOLESTEROL, DIRECT: Direct LDL: 88 mg/dL

## 2020-09-28 MED ORDER — HYDROCODONE-ACETAMINOPHEN 10-325 MG PO TABS: 1.0000 | ORAL_TABLET | Freq: Four times a day (QID) | ORAL | 0 refills | Status: DC | PRN

## 2020-09-28 MED ORDER — OMEPRAZOLE 40 MG PO CPDR: 40.0000 mg | DELAYED_RELEASE_CAPSULE | Freq: Every day | ORAL | 3 refills | Status: DC

## 2020-09-28 NOTE — Addendum Note (Signed)
Addended by: Wassim Kirksey L on: 09/28/2020 10:11 AM   Modules accepted: Orders  

## 2020-09-28 NOTE — Progress Notes (Signed)
   Subjective:    Patient ID: Robert Huang, male    DOB: Aug 23, 1961, 60 y.o.   MRN: [id number]  HPI Here for pain management, he is doing well.  Indication for chronic opioid: shoulder and foot pain Medication and dose: Norco 10-325 # pills per month: 120 Last UDS date: 11-19-19 Opioid Treatment Agreement signed (Y/N): 12-10-18 Opioid Treatment Agreement last reviewed with patient:  09-28-20 [facility] reviewed this encounter (include red flags): Yes    Review of Systems     Objective:   Physical Exam        Assessment & Plan:  Pain management, meds were refilled.  Gershon Crane, MD

## 2020-09-28 NOTE — Addendum Note (Signed)
Addended by: Gershon Crane A on: 09/28/2020 11:04 AM   Modules accepted: Orders

## 2020-09-29 ENCOUNTER — Encounter (HOSPITAL_BASED_OUTPATIENT_CLINIC_OR_DEPARTMENT_OTHER): Payer: 59 | Attending: Internal Medicine | Admitting: Internal Medicine

## 2020-09-29 DIAGNOSIS — L97522 Non-pressure chronic ulcer of other part of left foot with fat layer exposed: Secondary | ICD-10-CM | POA: Insufficient documentation

## 2020-09-29 DIAGNOSIS — E11621 Type 2 diabetes mellitus with foot ulcer: Secondary | ICD-10-CM | POA: Diagnosis not present

## 2020-09-29 DIAGNOSIS — L97512 Non-pressure chronic ulcer of other part of right foot with fat layer exposed: Secondary | ICD-10-CM | POA: Diagnosis not present

## 2020-09-29 DIAGNOSIS — Z888 Allergy status to other drugs, medicaments and biological substances status: Secondary | ICD-10-CM | POA: Insufficient documentation

## 2020-09-29 DIAGNOSIS — E1140 Type 2 diabetes mellitus with diabetic neuropathy, unspecified: Secondary | ICD-10-CM | POA: Diagnosis not present

## 2020-09-29 DIAGNOSIS — L97521 Non-pressure chronic ulcer of other part of left foot limited to breakdown of skin: Secondary | ICD-10-CM | POA: Diagnosis not present

## 2020-09-29 DIAGNOSIS — E1151 Type 2 diabetes mellitus with diabetic peripheral angiopathy without gangrene: Secondary | ICD-10-CM | POA: Insufficient documentation

## 2020-09-29 DIAGNOSIS — L03116 Cellulitis of left lower limb: Secondary | ICD-10-CM | POA: Diagnosis not present

## 2020-09-29 DIAGNOSIS — Z955 Presence of coronary angioplasty implant and graft: Secondary | ICD-10-CM | POA: Diagnosis not present

## 2020-09-29 NOTE — Progress Notes (Signed)
Robert Huang, Robert Huang ([id number]) . Visit Report for 09/29/2020 Debridement Details Patient Name: Date of Service: Robert Huang 09/29/2020 9:00 A M Medical Record Number: [id number] Patient Account Number: [id number] Date of Birth/Sex: Treating RN: July 09, 1961 (60 y.o. Robert Huang, Robert Huang Primary Care Provider: Laurey Huang Other Clinician: Referring Provider: Treating Provider/Extender: Robert Huang Kitchen in Treatment: 2 Debridement Performed for Assessment: Wound #5 Left Metatarsal head fourth Performed By: Physician Robert Huang., MD Debridement Type: Debridement Severity of Tissue Pre Debridement: Limited to breakdown of skin Level of Consciousness (Pre-procedure): Awake and Alert Pre-procedure Verification/Time Out Yes - 09:40 Taken: Start Time: 09:41 Pain Control: Lidocaine 4% Huang opical Solution Huang Area Debrided (L x W): otal 1 (cm) x 1 (cm) = 1 (cm) Tissue and other material debrided: Viable, Non-Viable, Callus, Slough, Skin: Dermis , Slough Level: Skin/Dermis Debridement Description: Selective/Open Wound Instrument: Curette Bleeding: Minimum Hemostasis Achieved: Pressure End Time: 09:45 Procedural Pain: 0 Post Procedural Pain: 0 Response to Treatment: Procedure was tolerated well Level of Consciousness (Post- Awake and Alert procedure): Post Debridement Measurements of Total Wound Length: (cm) 0.4 Width: (cm) 0.4 Depth: (cm) 0.1 Volume: (cm) 0.013 Character of Wound/Ulcer Post Debridement: Improved Severity of Tissue Post Debridement: Limited to breakdown of skin Post Procedure Diagnosis Same as Pre-procedure Electronic Signature(s) Signed: 09/29/2020 4:57:15 PM By: Robert Ham MD Signed: 09/29/2020 5:30:16 PM By: Robert Huang Entered By: Robert Huang on 09/29/2020 09:51:58 -------------------------------------------------------------------------------- HPI Details Patient Name: Date of Service: Robert Huang, Robert Huang. 09/29/2020  9:00 A M Medical Record Number: [id number] Patient Account Number: [id number] Date of Birth/Sex: Treating RN: 09/12/1960 (60 y.o. Robert Huang Primary Care Provider: Laurey Huang Other Clinician: Referring Provider: Treating Provider/Extender: Robert Huang Kitchen in Treatment: 2 History of Present Illness HPI Description: ADMISSION 05/27/2020 This is a 60 year old man who has type 2 diabetes who is here for wounds on his left foot in 2 locations. The patient has had problems with foot ulcers requiring surgery including a right great toe amputation and a left fifth ray amputation in July 2020 for underlying osteomyelitis although strangely I cannot see any pathology or cultures done in this area. He states that the area on his left fourth met head plantar aspect opened about a month ago. He has had continuous callus in this area. He saw Dr. Sarajane Jews his primary doctor at the beginning of September and was felt to have a cellulitis in the lateral part of his foot in this area he was given a shot of Rocephin and then ongoing antibiotics with Keflex for 10 days. It would appear that he was supposed to follow-up with Dr. Sarajane Jews although I do not see these notes. About a week and a half ago he stubbed his left first toe and has a wound on this area. He is using Neosporin to both wound areas. 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 Sarajane Jews ABI in our clinic was 1.23 on the left 10/8; the patient's area over the left fourth metatarsal head is fully closed. This was a split callus last week that I debrided. He still has an open area over the tip of his left  great toe. He is using a forefoot  offloading and silver alginate. We [facility] an appointment with [facility] to see about custom made diabetic shoes with inserts this appointment is for next Tuesday. Readmission: 09/14/2020 upon evaluation today patient appears to have several wounds over his left dorsal great toe, left fourth metatarsal head, and right second toe. The good news is none of these appear to be severe at this point. Nonetheless unfortunately he is going require a little bit of the debridement today to try to clear away some of the necrotic debris currently. Specifically the left metatarsal head of the fourth metatarsal does look particularly is a wound that is good to require sharp debridement. The patient does have a history of diabetes mellitus type 2, hypertension, and diabetic neuropathy. He has previously been seen here in the clinic and I am not certain that he is ever really have this completely healed although we have not seen him since October 2020. Fortunately his ABIs appear to be doing excellent today. 1/27; patient was readmitted to the clinic last week. He had an area over his right second toe which is healed also of the left dorsal great toe is also healed. He attributes these wounds to a hunting trip he took with boots that perhaps were not helpful on his feet. He still has the area on the fourth met head status post left fifth ray amputation in July 20 2/3; left fourth metatarsal head. Wound is about the same size as last week some debris around the circumference of the wound including callus and dry skin I removed this knocked down the circumference hopefully to make the surrounding tissue a little less senescent. Still using silver collagen. I talked to him about a total contact cast Electronic Signature(s) Signed: 09/29/2020 4:57:15 PM By: Robert Ham MD Entered By: Robert Huang on 09/29/2020  09:54:23 -------------------------------------------------------------------------------- Physical Exam Details Patient Name: Date of Service: Robert Huang, Robert Huang. 09/29/2020 9:00 A M Medical Record Number: [id number] Patient Account Number: [id number] Date of Birth/Sex: Treating RN: April 13, 1961 (60 y.o. Robert Huang Primary Care Provider: Laurey Huang Other Clinician: Referring Provider: Treating Provider/Extender: Robert Huang Kitchen in Treatment: 2 Constitutional Sitting or standing Blood Pressure is within target range for patient.. Pulse regular and within target range for patient.Robert Huang Kitchen Respirations regular, non-labored and within target range.. Temperature is normal and within the target range for the patient.Robert Huang Kitchen Appears in no distress. Notes Wound exam; all the wounds are healed except the fourth plantar metatarsal head. This looks the same as last time I removed dry skin callus from around the wound circumference. This looks healthy and should heal if we can reduce the amount of pressure. He has a Publishing rights manager) Signed: 09/29/2020 4:57:15 PM By: Robert Ham MD Entered By: Robert Huang on 09/29/2020 09:55:17 -------------------------------------------------------------------------------- Physician Orders Details Patient Name: Date of Service: Robert Huang, Robert Huang. 09/29/2020 9:00 A M Medical Record Number: [id number] Patient Account Number: [id number] Date of Birth/Sex: Treating RN: Jun 28, 1961 (60 y.o. Robert Huang Primary Care Provider: Laurey Huang Other Clinician: Referring Provider: Treating Provider/Extender: Robert Huang Kitchen in Treatment: 2 Verbal / Phone Orders: No Diagnosis Coding ICD-10 Coding Code Description E11.621 Type 2 diabetes mellitus with foot ulcer L97.522 Non-pressure chronic ulcer of other part of left foot with fat layer exposed L97.512 Non-pressure chronic ulcer of  other part of right foot with fat layer exposed  E11.40 Type 2 diabetes mellitus with diabetic neuropathy, unspecified I10 Essential (primary) hypertension Follow-up Appointments Return Appointment in 2 weeks. Bathing/ Shower/ Hygiene May shower and wash wound with soap and water. Off-Loading Wedge shoe to: - left foot to ambulate Non Wound Condition Protect area with: - pad with foam or gauze to right 2nd toe. Wound Treatment Wound #5 - Metatarsal head fourth Wound Laterality: Left Cleanser: Soap and Water Every Other Day/30 Days Discharge Instructions: May shower and wash wound with dial antibacterial soap and water prior to dressing change. Prim Dressing: Promogran Prisma Matrix, 4.34 (sq in) (silver collagen) (Dispense As Written) Every Other Day/30 Days ary Discharge Instructions: Moisten collagen with saline or hydrogel Secondary Dressing: Woven Gauze Sponges 2x2 in (Generic) Every Other Day/30 Days Discharge Instructions: Apply over primary dressing as directed. Secondary Dressing: Optifoam Non-Adhesive Dressing, 4x4 in (Generic) Every Other Day/30 Days Discharge Instructions: Apply over primary dressing cut to make foam donut Secured With: Conforming Stretch Gauze Bandage, Sterile 2x75 (in/in) (Generic) Every Other Day/30 Days Discharge Instructions: Secure with stretch gauze as directed. Secured With: Paper Tape, 2x10 (in/yd) (Generic) Every Other Day/30 Days Discharge Instructions: Secure dressing with tape as directed. Electronic Signature(s) Signed: 09/29/2020 4:57:15 PM By: Robert Ham MD Signed: 09/29/2020 5:30:16 PM By: Robert Huang Entered By: Robert Huang on 09/29/2020 09:48:20 -------------------------------------------------------------------------------- Problem List Details Patient Name: Date of Service: Robert Huang, Robert Huang. 09/29/2020 9:00 A M Medical Record Number: [id number] Patient Account Number: [id number] Date of Birth/Sex: Treating RN: [birth year] (60  y.o. Robert Huang, Robert Huang Primary Care Provider: Laurey Huang Other Clinician: Referring Provider: Treating Provider/Extender: Robert Huang Kitchen in Treatment: 2 Active Problems ICD-10 Encounter Code Description Active Date MDM Diagnosis E11.621 Type 2 diabetes mellitus with foot ulcer 09/14/2020 No Yes L97.522 Non-pressure chronic ulcer of other part of left foot with fat layer exposed 09/14/2020 No Yes L97.512 Non-pressure chronic ulcer of other part of right foot with fat layer exposed 09/14/2020 No Yes E11.40 Type 2 diabetes mellitus with diabetic neuropathy, unspecified 09/14/2020 No Yes I10 Essential (primary) hypertension 09/14/2020 No Yes Inactive Problems Resolved Problems Electronic Signature(s) Signed: 09/29/2020 4:57:15 PM By: Robert Ham MD Entered By: Robert Huang on 09/29/2020 09:51:13 -------------------------------------------------------------------------------- Progress Note Details Patient Name: Date of Service: Robert Huang, Robert Huang. 09/29/2020 9:00 A M Medical Record Number: [id number] Patient Account Number: [id number] Date of Birth/Sex: Treating RN: 13-Apr-1961 (59 y.o. Robert Huang Primary Care Provider: Laurey Huang Other Clinician: Referring Provider: Treating Provider/Extender: Robert Huang Kitchen in Treatment: 2 Subjective History of Present Illness (HPI) ADMISSION 05/27/2020 This is a 60 year old man who has type 2 diabetes who is here for wounds on his left foot in 2 locations. The patient has had problems with foot ulcers requiring surgery including a right great toe amputation and a left fifth ray amputation in July 2020 for underlying osteomyelitis although strangely I cannot see any pathology or cultures done in this area. He states that the area on his left fourth met head plantar aspect opened about a month ago. He has had continuous callus in this area. He saw Dr. Sarajane Jews his primary doctor at the beginning of  September and was felt to have a cellulitis in the lateral part of his foot in this area he was given a shot of Rocephin and then ongoing antibiotics with Keflex for 10 days. It would appear that he was supposed to follow-up with Dr. Sarajane Jews although I do not  see these notes. About a week and a half ago he stubbed his left first toe and has a wound on this area. He is using Neosporin to both wound areas. 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 Sarajane Jews ABI in our clinic was 1.23 on the left 10/8; the patient's area over the left fourth metatarsal head is fully closed. This was a split callus last week that I debrided. He still has an open area over the tip of his left great toe. He is using a forefoot offloading and silver alginate. We made [facility] appointment [facility] to see about custom made diabetic shoes with inserts this appointment is for next Tuesday. Readmission: 09/14/2020 upon evaluation today patient appears to have several wounds over his left dorsal great toe, left fourth metatarsal head, and right second toe. The good news is none of these appear to be severe at this point. Nonetheless unfortunately he is going require a little bit of the debridement today to try to clear away some of the necrotic debris currently. Specifically the left metatarsal head of the fourth metatarsal does look particularly is a wound that is good to require sharp debridement. The patient does have a history of diabetes mellitus type 2, hypertension, and diabetic neuropathy. He has previously been seen here in the clinic and I am not certain that he is ever really have this completely healed although we have  not seen him since October 2020. Fortunately his ABIs appear to be doing excellent today. 1/27; patient was readmitted to the clinic last week. He had an area over his right second toe which is healed also of the left dorsal great toe is also healed. He attributes these wounds to a hunting trip he took with boots that perhaps were not helpful on his feet. He still has the area on the fourth met head status post left fifth ray amputation in July 20 2/3; left fourth metatarsal head. Wound is about the same size as last week some debris around the circumference of the wound including callus and dry skin I removed this knocked down the circumference hopefully to make the surrounding tissue a little less senescent. Still using silver collagen. I talked to him about a total contact cast Objective Constitutional Sitting or standing Blood Pressure is within target range for patient.. Pulse regular and within target range for patient.Robert Huang Kitchen Respirations regular, non-labored and within target range.. Temperature is normal and within the target range for the patient.Robert Huang Kitchen Appears in no distress. Vitals Time Taken: 9:08 AM, Height: 72 in, Weight: 205 lbs, BMI: 27.8, Temperature: 98.3 F, Pulse: 69 bpm, Respiratory Rate: 16 breaths/min, Blood Pressure: 139/79 mmHg, Capillary Blood Glucose: 133 mg/dl. General Notes: Wound exam; all the wounds are healed except the fourth plantar metatarsal head. This looks the same as last time I removed dry skin callus from around the wound circumference. This looks healthy and should heal if we can reduce the amount of pressure. 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 Original cause of wound was Shear/Friction. The wound is located on the Left Metatarsal head fourth. The wound measures 0.4cm length x 0.4cm width x 0.1cm  depth; 0.126cm^2 area and 0.013cm^3 volume. There is Fat Layer (Subcutaneous Tissue) exposed. There is no tunneling  or undermining noted. There is a medium amount of serosanguineous drainage noted. The wound margin is well defined and not attached to the wound base. There is large (67-100%) red granulation within the wound bed. There is no necrotic tissue within the wound bed. Assessment Active Problems ICD-10 Type 2 diabetes mellitus with foot ulcer Non-pressure chronic ulcer of other part of left foot with fat layer exposed Non-pressure chronic ulcer of other part of right foot with fat layer exposed Type 2 diabetes mellitus with diabetic neuropathy, unspecified Essential (primary) hypertension Procedures Wound #5 Pre-procedure diagnosis of Wound #5 is a Diabetic Wound/Ulcer of the Lower Extremity located on the Left Metatarsal head fourth .Severity of Tissue Pre Debridement is: Limited to breakdown of skin. There was a Selective/Open Wound Skin/Dermis Debridement with a total area of 1 sq cm performed by Robert Huang., MD. With the following instrument(s): Curette to remove Viable and Non-Viable tissue/material. Material removed includes Callus, Slough, and Skin: Dermis after achieving pain control using Lidocaine 4% Topical Solution. A time out was conducted at 09:40, prior to the start of the procedure. A Minimum amount of bleeding was controlled with Pressure. The procedure was tolerated well with a pain level of 0 throughout and a pain level of 0 following the procedure. Post Debridement Measurements: 0.4cm length x 0.4cm width x 0.1cm depth; 0.013cm^3 volume. Character of Wound/Ulcer Post Debridement is improved. Severity of Tissue Post Debridement is: Limited to breakdown of skin. Post procedure Diagnosis Wound #5: Same as Pre-Procedure Plan Follow-up Appointments: Return Appointment in 2 weeks. Bathing/ Shower/ Hygiene: May shower and wash wound with soap and water. Off-Loading: Wedge shoe to: - left foot to ambulate Non Wound Condition: Protect area with: - pad with foam or gauze to  right 2nd toe. WOUND #5: - Metatarsal head fourth Wound Laterality: Left Cleanser: Soap and Water Every Other Day/30 Days Discharge Instructions: May shower and wash wound with dial antibacterial soap and water prior to dressing change. Prim Dressing: Promogran Prisma Matrix, 4.34 (sq in) (silver collagen) (Dispense As Written) Every Other Day/30 Days ary Discharge Instructions: Moisten collagen with saline or hydrogel Secondary Dressing: Woven Gauze Sponges 2x2 in (Generic) Every Other Day/30 Days Discharge Instructions: Apply over primary dressing as directed. Secondary Dressing: Optifoam Non-Adhesive Dressing, 4x4 in (Generic) Every Other Day/30 Days Discharge Instructions: Apply over primary dressing cut to make foam donut Secured With: Conforming Stretch Gauze Bandage, Sterile 2x75 (in/in) (Generic) Every Other Day/30 Days Discharge Instructions: Secure with stretch gauze as directed. Secured With: Paper Huang ape, 2x10 (in/yd) (Generic) Every Other Day/30 Days Discharge Instructions: Secure dressing with tape as directed. #1 going to continue with [place] collagen/foam/gauze. 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 If this is not closed by the next time I see him in 2 weeks I have asked him to prepare for a total contact cast 3. There is no evidence of infection or ischemia Electronic Signature(s) Signed: 09/29/2020 4:57:15 PM By: Robert Ham MD Entered By: Robert Huang on 09/29/2020 09:56:23 -------------------------------------------------------------------------------- SuperBill Details Patient Name: Date of Service: Robert Huang, Robert Huang 09/29/2020 Medical Record Number: [id number] Patient Account Number: [id number] Date of Birth/Sex: Treating RN: 07/20/1961 (60 y.o. Robert Huang Primary Care Provider: Laurey Huang Other Clinician: Referring Provider: Treating Provider/Extender: Robert Huang Kitchen in Treatment: 2 Diagnosis Coding ICD-10 Codes Code  Description [phone] Type 2 diabetes  mellitus with foot ulcer L97.522 Non-pressure chronic ulcer of other part of left foot with fat layer exposed L97.512 Non-pressure chronic ulcer of other part of right foot with fat layer exposed E11.40 Type 2 diabetes mellitus with diabetic neuropathy, unspecified I10 Essential (primary) hypertension Facility Procedures CPT4 Code: [id number] Description: [phone] - DEBRIDE WOUND 1ST 20 SQ CM OR < ICD-10 Diagnosis Description L97.522 Non-pressure chronic ulcer of other part of left foot with fat layer exposed Modifier: Quantity: 1 Physician Procedures Electronic Signature(s) Signed: 09/29/2020 4:57:15 PM By: Robert Ham MD Entered By: Robert Huang on 09/29/2020 09:56:36

## 2020-10-03 ENCOUNTER — Telehealth: Payer: Self-pay | Admitting: Family Medicine

## 2020-10-03 ENCOUNTER — Other Ambulatory Visit: Payer: Self-pay

## 2020-10-03 MED ORDER — GLIPIZIDE 5 MG PO TABS: ORAL_TABLET | ORAL | 2 refills | Status: DC

## 2020-10-03 NOTE — Progress Notes (Signed)
[name], [name] ([id number]) . Visit Report for 09/29/2020 Arrival Information Details Patient Name: Date of Service: Robert Huang 09/29/2020 9:00 A M Medical Record Number: [id number] Patient Account Number: [id number] Date of Birth/Sex: Treating RN: 05-20-1961 (60 y.o. Robert Huang, Robert Huang Primary Care Shon Indelicato: [name] Other Clinician: Referring Cortlynn Hollinsworth: Treating Azzure Garabedian/Extender: Doran Stabler in Treatment: 2 Visit Information History Since Last Visit Added or deleted any medications: No Patient Arrived: Ambulatory Any new allergies or adverse reactions: No Arrival Time: 09:07 Had a fall or experienced change in No Accompanied By: self activities of daily living that may affect Transfer Assistance: None risk of falls: Patient Identification Verified: Yes Signs or symptoms of abuse/neglect since last visito No Secondary Verification Process Completed: Yes Hospitalized since last visit: No Patient Requires Transmission-Based Precautions: No Implantable device outside of the clinic excluding No Patient Has Alerts: Yes cellular tissue based products placed in the center Patient Alerts: R ABI: 0.96 since last visit: L ABI: 1.19 Has Dressing in Place as Prescribed: Yes Pain Present Now: No Electronic Signature(s) Signed: 10/03/2020 9:06:40 AM By: Karl Ito Entered By: Karl Ito on 09/29/2020 09:07:36 -------------------------------------------------------------------------------- Encounter Discharge Information Details Patient Name: Date of Service: Robert Huang, Robert Debar T. 09/29/2020 9:00 A M Medical Record Number: [id number] Patient Account Number: [id number] Date of Birth/Sex: Treating RN: 1961-01-09 (60 y.o. Robert Huang Primary Care Tuff Clabo: [name] Other Clinician: Referring Jaeveon Ashland: Treating Devonta Blanford/Extender: Doran Stabler in Treatment: 2 Encounter Discharge Information Items Post  Procedure Vitals Discharge Condition: Stable Temperature (F): 98.3 Ambulatory Status: Ambulatory Pulse (bpm): 69 Discharge Destination: Home Respiratory Rate (breaths/min): 18 Transportation: Private Auto Blood Pressure (mmHg): 139/79 Accompanied By: self Schedule Follow-up Appointment: Yes Clinical Summary of Care: Patient Declined Electronic Signature(s) Signed: 09/29/2020 5:53:40 PM By: [name] RN, BSN Entered By: [name] on 09/29/2020 10:00:12 -------------------------------------------------------------------------------- Lower Extremity Assessment Details Patient Name: Date of Service: Robert Huang, Robert Debar T. 09/29/2020 9:00 A M Medical Record Number: [id number] Patient Account Number: [id number] Date of Birth/Sex: Treating RN: 18-Apr-1961 (60 y.o. Robert Huang Primary Care Kiamesha Samet: [name] Other Clinician: Referring Kitai Purdom: Treating Chandni Gagan/Extender: Doran Stabler in Treatment: 2 Edema Assessment Assessed: Kyra Searles: No] Franne Forts: No] Edema: [Left: Yes] [Right: Yes] Calf Left: Right: Point of Measurement: 35 cm From Medial Instep 37.2 cm 33 cm Ankle Left: Right: Point of Measurement: 11 cm From Medial Instep 22 cm 22 cm Vascular Assessment Pulses: Dorsalis Pedis Palpable: [Left:Yes] [Right:Yes] Electronic Signature(s) Signed: 09/30/2020 4:51:44 PM By: Zandra Abts RN, BSN Entered By: Zandra Abts on 09/29/2020 09:14:57 -------------------------------------------------------------------------------- Multi Wound Chart Details Patient Name: Date of Service: Robert Huang, Robert Debar T. 09/29/2020 9:00 A M Medical Record Number: [id number] Patient Account Number: [id number] Date of Birth/Sex: Treating RN: 1960/12/13 (60 y.o. Robert Huang Primary Care Janel Beane: [name] Other Clinician: Referring Reshma Hoey: Treating Latrise Bowland/Extender: Doran Stabler in Treatment: 2 Vital Signs Height(in):  72 Capillary Blood Glucose(mg/dl): 244 Weight(lbs): 010 Pulse(bpm): 69 Body Mass Index(BMI): 28 Blood Pressure(mmHg): 139/79 Temperature(F): 98.3 Respiratory Rate(breaths/min): 16 Photos: [5:No Photos Left Metatarsal head fourth] [N/A:N/A N/A] Wound Location: [5:Shear/Friction] [N/A:N/A] Wounding Event: [5:Diabetic Wound/Ulcer of the Lower] [N/A:N/A] Primary Etiology: [5:Extremity Coronary Artery Disease,] [N/A:N/A] Comorbid History: [5:Hypertension, Type II Diabetes, Gout 08/29/2020] [N/A:N/A] Date Acquired: [5:2] [N/A:N/A] Weeks of Treatment: [5:Open] [N/A:N/A] Wound Status: [5:0.4x0.4x0.1] [N/A:N/A] Measurements L x W x D (cm) [5:0.126] [N/A:N/A] A (  cm) : rea [5:0.013] [N/A:N/A] Volume (cm) : [5:96.40%] [N/A:N/A] % Reduction in A rea: [5:98.20%] [N/A:N/A] % Reduction in Volume: [5:Grade 2] [N/A:N/A] Classification: [5:Medium] [N/A:N/A] Exudate A mount: [5:Serosanguineous] [N/A:N/A] Exudate Type: [5:red, brown] [N/A:N/A] Exudate Color: [5:Well defined, not attached] [N/A:N/A] Wound Margin: [5:Large (67-100%)] [N/A:N/A] Granulation A mount: [5:Red] [N/A:N/A] Granulation Quality: [5:None Present (0%)] [N/A:N/A] Necrotic A mount: [5:Fat Layer (Subcutaneous Tissue): Yes N/A] Exposed Structures: [5:Fascia: No Tendon: No Muscle: No Joint: No Bone: No Large (67-100%)] [N/A:N/A] Epithelialization: [5:Debridement - Selective/Open Wound N/A] Debridement: Pre-procedure Verification/Time Out 09:40 [N/A:N/A] Taken: [5:Lidocaine 4% Topical Solution] [N/A:N/A] Pain Control: [5:Callus] [N/A:N/A] Tissue Debrided: [5:Skin/Dermis] [N/A:N/A] Level: [5:1] [N/A:N/A] Debridement A (sq cm): [5:rea Curette] [N/A:N/A] Instrument: [5:Minimum] [N/A:N/A] Bleeding: [5:Pressure] [N/A:N/A] Hemostasis A chieved: [5:0] [N/A:N/A] Procedural Pain: [5:0] [N/A:N/A] Post Procedural Pain: [5:Procedure was tolerated well] [N/A:N/A] Debridement Treatment Response: [5:0.4x0.4x0.1] [N/A:N/A] Post Debridement  Measurements L x W x D (cm) [5:0.013] [N/A:N/A] Post Debridement Volume: (cm) [5:Debridement] [N/A:N/A] Treatment Notes Electronic Signature(s) Signed: 09/29/2020 4:57:15 PM By: Baltazar Najjar MD Signed: 09/29/2020 5:30:16 PM By: Shawn Stall Entered By: Baltazar Najjar on 09/29/2020 09:51:18 -------------------------------------------------------------------------------- Multi-Disciplinary Care Plan Details Patient Name: Date of Service: Robert Huang, Robert Debar T. 09/29/2020 9:00 A M Medical Record Number: [id number] Patient Account Number: [id number] Date of Birth/Sex: Treating RN: 09-19-1960 (59 y.o. Robert Huang, Millard.Loa Primary Care Brianne Maina: [name] Other Clinician: Referring Jamielyn Petrucci: Treating Beena Catano/Extender: Doran Stabler in Treatment: 2 Active Inactive Abuse / Safety / Falls / Self Care Management Nursing Diagnoses: Potential for falls Goals: Patient/caregiver will verbalize/demonstrate measures taken to prevent injury and/or falls Date Initiated: 09/14/2020 Target Resolution Date: 10/12/2020 Goal Status: Active Interventions: Assess fall risk on admission and as needed Assess impairment of mobility on admission and as needed per policy Notes: Nutrition Nursing Diagnoses: Impaired glucose control: actual or potential Potential for alteratiion in Nutrition/Potential for imbalanced nutrition Goals: Patient/caregiver will maintain therapeutic glucose control Date Initiated: 09/14/2020 Target Resolution Date: 10/12/2020 Goal Status: Active Interventions: Assess patient nutrition upon admission and as needed per policy Provide education on elevated blood sugars and impact on wound healing Treatment Activities: Patient referred to Primary Care Physician for further nutritional evaluation : 09/14/2020 Notes: Wound/Skin Impairment Nursing Diagnoses: Impaired tissue integrity Knowledge deficit related to ulceration/compromised skin  integrity Goals: Patient/caregiver will verbalize understanding of skin care regimen Date Initiated: 09/14/2020 Target Resolution Date: 10/12/2020 Goal Status: Active Ulcer/skin breakdown will have a volume reduction of 30% by week 4 Date Initiated: 09/14/2020 Target Resolution Date: 10/12/2020 Goal Status: Active Interventions: Assess patient/caregiver ability to obtain necessary supplies Assess patient/caregiver ability to perform ulcer/skin care regimen upon admission and as needed Assess ulceration(s) every visit Treatment Activities: Topical wound management initiated : 09/14/2020 Notes: Electronic Signature(s) Signed: 09/29/2020 5:30:16 PM By: Shawn Stall Entered By: Shawn Stall on 09/29/2020 09:01:02 -------------------------------------------------------------------------------- Pain Assessment Details Patient Name: Date of Service: Robert Cook T. 09/29/2020 9:00 A M Medical Record Number: [id number] Patient Account Number: [id number] Date of Birth/Sex: Treating RN: 1960-10-22 (60 y.o. Robert Huang Primary Care Evonne Rinks: [name] Other Clinician: Referring Karliah Kowalchuk: Treating Berniece Abid/Extender: Doran Stabler in Treatment: 2 Active Problems Location of Pain Severity and Description of Pain Patient Has Paino No Site Locations Pain Management and Medication Current Pain Management: Electronic Signature(s) Signed: 09/29/2020 5:30:16 PM By: Shawn Stall Signed: 10/03/2020 9:06:40 AM By: Karl Ito Entered By: Karl Ito on 09/29/2020 09:09:27 -------------------------------------------------------------------------------- Patient/Caregiver Education Details Patient Name: Date of  Service: [name], [name] 2/3/2022andnbsp9:00 A M Medical Record Number: [id number] Patient Account Number: [id number] Date of Birth/Gender: Treating RN: 1960/10/25 (60 y.o. Robert Huang Primary Care Physician: [name] Other  Clinician: Referring Physician: Treating Physician/Extender: Doran Stabler in Treatment: 2 Education Assessment Education Provided To: Patient Education Topics Provided Elevated Blood Sugar/ Impact on Healing: Handouts: Elevated Blood Sugars: How Do They Affect Wound Healing Methods: Explain/Verbal Responses: Reinforcements needed Electronic Signature(s) Signed: 09/29/2020 5:30:16 PM By: Shawn Stall Entered By: Shawn Stall on 09/29/2020 09:01:12 -------------------------------------------------------------------------------- Wound Assessment Details Patient Name: Date of Service: Robert Cook T. 09/29/2020 9:00 A M Medical Record Number: [id number] Patient Account Number: [id number] Date of Birth/Sex: Treating RN: 01/18/1961 (60 y.o. Robert Huang, Millard.Loa Primary Care Hurley Sobel: [name] Other Clinician: Referring Meshilem Machuca: Treating Alexander Aument/Extender: Doran Stabler in Treatment: 2 Wound Status Wound Number: 5 Primary Diabetic Wound/Ulcer of the Lower Extremity Etiology: Wound Location: Left Metatarsal head fourth Wound Status: Open Wounding Event: Shear/Friction Comorbid Coronary Artery Disease, Hypertension, Type II Diabetes, Date Acquired: 08/29/2020 History: Gout Weeks Of Treatment: 2 Clustered Wound: No Photos Photo Uploaded By: Benjaman Kindler on 09/30/2020 15:23:28 Wound Measurements Length: (cm) 0.4 Width: (cm) 0.4 Depth: (cm) 0.1 Area: (cm) 0.126 Volume: (cm) 0.013 % Reduction in Area: 96.4% % Reduction in Volume: 98.2% Epithelialization: Large (67-100%) Tunneling: No Undermining: No Wound Description Classification: Grade 2 Wound Margin: Well defined, not attached Exudate Amount: Medium Exudate Type: Serosanguineous Exudate Color: red, brown Foul Odor After Cleansing: No Slough/Fibrino No Wound Bed Granulation Amount: Large (67-100%) Exposed Structure Granulation Quality: Red Fascia Exposed:  No Necrotic Amount: None Present (0%) Fat Layer (Subcutaneous Tissue) Exposed: Yes Tendon Exposed: No Muscle Exposed: No Joint Exposed: No Bone Exposed: No Treatment Notes Wound #5 (Metatarsal head fourth) Wound Laterality: Left Cleanser Soap and Water Discharge Instruction: May shower and wash wound with dial antibacterial soap and water prior to dressing change. Peri-Wound Care Topical Primary Dressing Promogran Prisma Matrix, 4.34 (sq in) (silver collagen) Discharge Instruction: Moisten collagen with saline or hydrogel Secondary Dressing Woven Gauze Sponges 2x2 in Discharge Instruction: Apply over primary dressing as directed. Optifoam Non-Adhesive Dressing, 4x4 in Discharge Instruction: Apply over primary dressing cut to make foam donut Secured With Conforming Stretch Gauze Bandage, Sterile 2x75 (in/in) Discharge Instruction: Secure with stretch gauze as directed. Paper Tape, 2x10 (in/yd) Discharge Instruction: Secure dressing with tape as directed. Compression Wrap Compression Stockings Add-Ons Electronic Signature(s) Signed: 09/29/2020 5:30:16 PM By: Shawn Stall Signed: 09/30/2020 4:51:44 PM By: Zandra Abts RN, BSN Entered By: Zandra Abts on 09/29/2020 09:15:19 -------------------------------------------------------------------------------- Vitals Details Patient Name: Date of Service: Robert Huang, Robert Debar T. 09/29/2020 9:00 A M Medical Record Number: [id number] Patient Account Number: [id number] Date of Birth/Sex: Treating RN: 1961-03-11 (60 y.o. Robert Huang, Robert Huang Primary Care Marquel Pottenger: [name] Other Clinician: Referring Ger Nicks: Treating Emeline Simpson/Extender: Doran Stabler in Treatment: 2 Vital Signs Time Taken: 09:08 Temperature (F): 98.3 Height (in): 72 Pulse (bpm): 69 Weight (lbs): 205 Respiratory Rate (breaths/min): 16 Body Mass Index (BMI): 27.8 Blood Pressure (mmHg): 139/79 Capillary Blood Glucose (mg/dl):  470 Reference Range: 80 - 120 mg / dl Electronic Signature(s) Signed: 10/03/2020 9:06:40 AM By: Karl Ito Entered By: Karl Ito on 09/29/2020 09:09:14

## 2020-10-03 NOTE — Telephone Encounter (Signed)
Pt is returning the call to the office for his lab results 

## 2020-10-04 NOTE — Progress Notes (Signed)
DAYNE, CHAIT T ([id number]) . Visit Report for 09/14/2020 Chief Complaint Document Details Patient Name: Date of Service: [name] 09/14/2020 1:15 PM Medical Record Number: [id number] Patient Account Number: [id number] Date of Birth/Sex: Treating RN: July 27, 1961 (60 y.o. Ernestene Mention Primary Care Provider: [name] Other Clinician: Referring Provider: Treating Provider/Extender: Moishe Spice in Treatment: 0 Information Obtained from: Patient Chief Complaint Bilateral foot ulcers Electronic Signature(s) Signed: 09/14/2020 1:51:33 PM By: Worthy Keeler PA-C Entered By: Worthy Keeler on 09/14/2020 13:51:33 -------------------------------------------------------------------------------- Debridement Details Patient Name: Date of Service: Pia Mau T. 09/14/2020 1:15 PM Medical Record Number: [id number] Patient Account Number: [id number] Date of Birth/Sex: Treating RN: July 29, 1961 (60 y.o. Ernestene Mention Primary Care Provider: [name] Other Clinician: Referring Provider: Treating Provider/Extender: Moishe Spice in Treatment: 0 Debridement Performed for Assessment: Wound #5 Left Metatarsal head fourth Performed By: Physician Worthy Keeler, PA Debridement Type: Debridement Severity of Tissue Pre Debridement: Fat layer exposed Level of Consciousness (Pre-procedure): Awake and Alert Pre-procedure Verification/Time Out Yes - 14:00 Taken: Start Time: 14:02 Pain Control: Other : benzocaine 20% spray T Area Debrided (L x W): otal 1.5 (cm) x 3 (cm) = 4.5 (cm) Tissue and other material debrided: Viable, Non-Viable, Callus, Slough, Subcutaneous, Slough Level: Skin/Subcutaneous Tissue Debridement Description: Excisional Instrument: Curette Bleeding: Minimum Hemostasis Achieved: Pressure End Time: 14:09 Procedural Pain: 0 Post Procedural Pain: 0 Response to Treatment: Procedure was  tolerated well Level of Consciousness (Post- Awake and Alert procedure): Post Debridement Measurements of Total Wound Length: (cm) 1.5 Width: (cm) 3 Depth: (cm) 0.2 Volume: (cm) 0.707 Character of Wound/Ulcer Post Debridement: Improved Severity of Tissue Post Debridement: Fat layer exposed Post Procedure Diagnosis Same as Pre-procedure Electronic Signature(s) Signed: 09/14/2020 5:51:30 PM By: Baruch Gouty RN, BSN Signed: 09/14/2020 6:05:28 PM By: Worthy Keeler PA-C Entered By: Baruch Gouty on 09/14/2020 14:08:46 -------------------------------------------------------------------------------- HPI Details Patient Name: Date of Service: [name], [name] 09/14/2020 1:15 PM Medical Record Number: [id number] Patient Account Number: [id number] Date of Birth/Sex: Treating RN: Jul 04, 1961 (60 y.o. Ernestene Mention Primary Care Provider: [name] Other Clinician: Referring Provider: Treating Provider/Extender: Moishe Spice in Treatment: 0 History of Present Illness HPI Description: ADMISSION 05/27/2020 This is a 60 year old man who has type 2 diabetes who is here for wounds on his left foot in 2 locations. The patient has had problems with foot ulcers requiring surgery including a right great toe amputation and a left fifth ray amputation in July 2020 for underlying osteomyelitis although strangely I cannot see any pathology or cultures done in this area. He states that the area on his left fourth met head plantar aspect opened about a month ago. He has had continuous callus in this area. He saw Dr. Sarajane Jews his primary doctor at the beginning of September and was felt to have a cellulitis in the lateral part of his foot in this area he was given a shot of Rocephin and then ongoing antibiotics with Keflex for 10 days. It would appear that he was supposed to follow-up with Dr. Sarajane Jews although I do not see these notes. About a week and a half ago he stubbed his  left first toe and has a wound on this area. He is using Neosporin to both wound areas. 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 He is already been called out of work for a month to work from home because a to offload the foot by Dr. Sarajane Jews ABI in our clinic was 1.23 on the left 10/8; the patient's area over the left fourth metatarsal head is fully closed. This was a split callus last week that I debrided. He still has an open area over the tip of his left great toe. He is using a forefoot offloading and silver alginate. We [facility] appointment [facility] to see about custom made diabetic shoes with inserts this appointment is for next Tuesday. Readmission: 09/14/2020 upon evaluation today patient appears to have several wounds over his left dorsal great toe, left fourth metatarsal head, and right second toe. The good news is none of these appear to be severe at this point. Nonetheless unfortunately he is going require a little bit of the debridement today to try to clear away some of the necrotic debris currently. Specifically the left metatarsal head of the fourth metatarsal does look particularly is a wound that is good to require sharp debridement. The patient does have a history of diabetes mellitus type 2, hypertension, and diabetic neuropathy. He has previously been seen here in the clinic and I am not certain that he is ever really have this completely healed although we have not seen him since October 2020. Fortunately his ABIs appear to be doing excellent today. Electronic Signature(s) Signed: 09/14/2020 6:03:12 PM By: Worthy Keeler PA-C Entered By: Worthy Keeler on 09/14/2020 18:03:12 -------------------------------------------------------------------------------- Physical Exam  Details Patient Name: Date of Service: [name] 09/14/2020 1:15 PM Medical Record Number: [id number] Patient Account Number: [id number] Date of Birth/Sex: Treating RN: 1961/03/16 (60 y.o. Ernestene Mention Primary Care Provider: [name] Other Clinician: Referring Provider: Treating Provider/Extender: Moishe Spice in Treatment: 0 Constitutional patient is hypertensive.. pulse regular and within target range for patient.[name] respirations regular, non-labored and within target range for patient.[name] temperature within target range for patient.. Well-nourished and well-hydrated in no acute distress. Eyes conjunctiva clear no eyelid edema noted. pupils equal round and reactive to light and accommodation. Ears, Nose, Mouth, and Throat no gross abnormality of ear auricles or external auditory canals. normal hearing noted during conversation. mucus membranes moist. Respiratory normal breathing without difficulty. Musculoskeletal normal gait and posture. no significant deformity or arthritic changes, no loss or range of motion, no clubbing. Psychiatric this patient is able to make decisions and demonstrates good insight into disease process. Alert and Oriented x 3. pleasant and cooperative. Notes Upon inspection patient's wound bed actually showed signs of fairly good granulation at this time. His wounds appear to be doing well except for the left metatarsal head fourth which did require sharp debridement to clear this away he did have some depth to the wound but this was really not that bad at all. Post debridement the wound bed appears to be doing much better which is great news and I am very hopeful that he will show signs of improvement going forward. Electronic Signature(s) Signed: 09/14/2020 6:03:46 PM By: Worthy Keeler PA-C Entered By: Worthy Keeler on 09/14/2020  18:03:46 -------------------------------------------------------------------------------- Physician Orders Details Patient Name: Date of Service: [name], [name] 09/14/2020 1:15 PM Medical Record Number: [id number] Patient Account Number: [id number] Date of Birth/Sex: Treating RN: 17-Apr-1961 (59  y.o. Ernestene Mention Primary Care Provider: [name] Other Clinician: Referring Provider: Treating Provider/Extender: Moishe Spice in Treatment: 0 Verbal / Phone Orders: No Diagnosis Coding ICD-10 Coding Code Description E11.621 Type 2 diabetes mellitus with foot ulcer L97.522 Non-pressure chronic ulcer of other part of left foot with fat layer exposed L97.512 Non-pressure chronic ulcer of other part of right foot with fat layer exposed E11.40 Type 2 diabetes mellitus with diabetic neuropathy, unspecified I10 Essential (primary) hypertension Follow-up Appointments Return Appointment in 1 week. Bathing/ Shower/ Hygiene May shower and wash wound with soap and water. Off-Loading Wedge shoe to: - left foot to ambulate Wound Treatment Wound #4 - T Great oe Wound Laterality: Dorsal, Left Cleanser: Soap and Water Every Other Day/30 Days Discharge Instructions: May shower and wash wound with dial antibacterial soap and water prior to dressing change. Prim Dressing: Promogran Prisma Matrix, 4.34 (sq in) (silver collagen) ary Every Other Day/30 Days Discharge Instructions: Moisten collagen with saline or hydrogel Secondary Dressing: Woven Gauze Sponges 2x2 in (DME) (Generic) Every Other Day/30 Days Discharge Instructions: Apply over primary dressing as directed. Secured With: Child psychotherapist, Sterile 2x75 (in/in) (DME) (Generic) Every Other Day/30 Days Discharge Instructions: Secure with stretch gauze or bandaid Secured With: Paper Tape, 1x10 (in/yd) (DME) (Generic) Every Other Day/30 Days Discharge Instructions: Secure dressing with tape as  directed. Wound #5 - Metatarsal head fourth Wound Laterality: Left Cleanser: Soap and Water Every Other Day/30 Days Discharge Instructions: May shower and wash wound with dial antibacterial soap and water prior to dressing change. Prim Dressing: Promogran Prisma Matrix, 4.34 (sq in) (silver collagen) (DME) (Dispense As Written) Every Other Day/30 Days ary Discharge Instructions: Moisten collagen with saline or hydrogel Secondary Dressing: Woven Gauze Sponges 2x2 in (DME) (Generic) Every Other Day/30 Days Discharge Instructions: Apply over primary dressing as directed. Secondary Dressing: Optifoam Non-Adhesive Dressing, 4x4 in (DME) (Generic) Every Other Day/30 Days Discharge Instructions: Apply over primary dressing cut to make foam donut Secured With: Conforming Stretch Gauze Bandage, Sterile 2x75 (in/in) (DME) (Generic) Every Other Day/30 Days Discharge Instructions: Secure with stretch gauze as directed. Secured With: Paper Tape, 2x10 (in/yd) (DME) (Generic) Every Other Day/30 Days Discharge Instructions: Secure dressing with tape as directed. Wound #6 - T Second oe Wound Laterality: Right Prim Dressing: Promogran Prisma Matrix, 4.34 (sq in) (silver collagen) Every Other Day/30 Days ary Discharge Instructions: Moisten collagen with saline or hydrogel Secondary Dressing: Woven Gauze Sponges 2x2 in (DME) (Generic) Every Other Day/30 Days Discharge Instructions: Apply over primary dressing as directed. Secured With: Child psychotherapist, Sterile 2x75 (in/in) (DME) (Generic) Every Other Day/30 Days Discharge Instructions: Secure with stretch gauze or bandaid Secured With: Paper Tape, 1x10 (in/yd) (DME) (Generic) Every Other Day/30 Days Discharge Instructions: Secure dressing with tape as directed. Consults Orthotist - [facility] to fit for diabetic inserts and/or shoes Electronic Signature(s) Signed: 09/14/2020 5:51:30 PM By: Baruch Gouty RN, BSN Signed: 09/14/2020  6:05:28 PM By: Worthy Keeler PA-C Entered By: Baruch Gouty on 09/14/2020 17:26:12 Prescription 09/14/2020 -------------------------------------------------------------------------------- Cammie Mcgee PA Patient Name: Provider: May 03, 1961 [id number] Date of Birth: NPI#: Jerilynn Mages [id number] Sex: DEA #: [phone] Phone #: License #: [place] Patient Address: [address], [place] [address] [place], [place] [address] [phone] Allergies No Known Drug Allergies Provider's Orders Orthotist - [facility] to fit for diabetic inserts  and/or shoes Hand Signature: Date(s): Electronic Signature(s) Signed: 09/14/2020 5:51:30 PM By: Baruch Gouty RN, BSN Signed: 09/14/2020 6:05:28 PM By: Worthy Keeler PA-C Entered By: Baruch Gouty on 09/14/2020 17:26:13 -------------------------------------------------------------------------------- Problem List Details Patient Name: Date of Service: [name], [name] 09/14/2020 1:15 PM Medical Record Number: [id number] Patient Account Number: [id number] Date of Birth/Sex: Treating RN: [birth year] (60 y.o. Ernestene Mention Primary Care Provider: [name] Other Clinician: Referring Provider: Treating Provider/Extender: Moishe Spice in Treatment: 0 Active Problems ICD-10 Encounter Code Description Active Date MDM Diagnosis E11.621 Type 2 diabetes mellitus with foot ulcer 09/14/2020 No Yes L97.522 Non-pressure chronic ulcer of other part of left foot with fat layer exposed 09/14/2020 No Yes L97.512 Non-pressure chronic ulcer of other part of right foot with fat layer exposed 09/14/2020 No Yes E11.40 Type 2 diabetes mellitus with diabetic neuropathy, unspecified 09/14/2020 No Yes I10 Essential (primary) hypertension 09/14/2020 No Yes Inactive Problems Resolved Problems Electronic Signature(s) Signed:  09/14/2020 1:54:15 PM By: Worthy Keeler PA-C Previous Signature: 09/14/2020 1:51:03 PM Version By: Worthy Keeler PA-C Entered By: Worthy Keeler on 09/14/2020 13:54:14 -------------------------------------------------------------------------------- Progress Note Details Patient Name: Date of Service: [name], [name] 09/14/2020 1:15 PM Medical Record Number: [id number] Patient Account Number: [id number] Date of Birth/Sex: Treating RN: 1960/09/29 (60 y.o. Ernestene Mention Primary Care Provider: [name] Other Clinician: Referring Provider: Treating Provider/Extender: Moishe Spice in Treatment: 0 Subjective Chief Complaint Information obtained from Patient Bilateral foot ulcers History of Present Illness (HPI) ADMISSION 05/27/2020 This is a 60 year old man who has type 2 diabetes who is here for wounds on his left foot in 2 locations. The patient has had problems with foot ulcers requiring surgery including a right great toe amputation and a left fifth ray amputation in July 2020 for underlying osteomyelitis although strangely I cannot see any pathology or cultures done in this area. He states that the area on his left fourth met head plantar aspect opened about a month ago. He has had continuous callus in this area. He saw Dr. Sarajane Jews his primary doctor at the beginning of September and was felt to have a cellulitis in the lateral part of his foot in this area he was given a shot of Rocephin and then ongoing antibiotics with Keflex for 10 days. It would appear that he was supposed to follow-up with Dr. Sarajane Jews although I do not see these notes. About a week and a half ago he stubbed his left first toe and has a wound on this area. He is using Neosporin to both wound areas. 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 He is already been called out of work for a month to work from home because a to offload the foot by Dr. Sarajane Jews ABI in our clinic was 1.23 on the left 10/8; the patient's area over the left fourth metatarsal head is fully closed. This was a split callus last week that I debrided. He still has an open area over the tip of his left great toe. He is using a forefoot offloading and silver alginate. We [facility] appointment [facility] to see about custom made diabetic shoes with inserts this appointment is for next Tuesday. Readmission: 09/14/2020 upon evaluation  today patient appears to have several wounds over his left dorsal great toe, left fourth metatarsal head, and right second toe. The good news is none of these appear to be severe at this point. Nonetheless unfortunately he is going require a little bit of the debridement today to try to clear away some of the necrotic debris currently. Specifically the left metatarsal head of the fourth metatarsal does look particularly is a wound that is good to require sharp debridement. The patient does have a history of diabetes mellitus type 2, hypertension, and diabetic neuropathy. He has previously been seen here in the clinic and I am not certain that he is ever really have this completely healed although we have not seen him since October 2020. Fortunately his ABIs appear to be doing excellent today. Patient History Information obtained from Patient. Allergies No Known Drug Allergies Family History Cancer - Mother,Father, Diabetes - Father,Paternal Grandparents,Child, Heart Disease - Mother,Maternal Grandparents,Father,Siblings, Hypertension - Maternal Grandparents,Paternal Grandparents,Mother,Father,Siblings, Lung Disease - Father, Thyroid Problems - Mother, No family history of Hereditary Spherocytosis, Kidney Disease, Seizures, Stroke, Tuberculosis. Social History Never smoker, Marital  Status - Married, Alcohol Use - Moderate, Drug Use - No History, Caffeine Use - Daily. Medical History Eyes Denies history of Cataracts, Glaucoma, Optic Neuritis Ear/Nose/Mouth/Throat Denies history of Chronic sinus problems/congestion Hematologic/Lymphatic Denies history of Anemia, Hemophilia, Human Immunodeficiency Virus, Lymphedema, Sickle Cell Disease Respiratory Denies history of Aspiration, Asthma, Chronic Obstructive Pulmonary Disease (COPD), Pneumothorax, Sleep Apnea, Tuberculosis Cardiovascular Patient has history of Coronary Artery Disease, Hypertension Denies history of Angina, Arrhythmia, Congestive Heart Failure, Deep Vein Thrombosis, Hypotension, Myocardial Infarction, Peripheral Arterial Disease, Peripheral Venous Disease, Phlebitis, Vasculitis Gastrointestinal Denies history of Cirrhosis , Colitis, Crohnoos, Hepatitis A, Hepatitis B, Hepatitis C Endocrine Patient has history of Type II Diabetes Denies history of Type I Diabetes Immunological Denies history of Lupus Erythematosus, Raynaudoos, Scleroderma Integumentary (Skin) Denies history of History of Burn Musculoskeletal Patient has history of Gout Denies history of Rheumatoid Arthritis, Osteoarthritis, Osteomyelitis Neurologic Denies history of Dementia, Neuropathy, Quadriplegia, Paraplegia, Seizure Disorder Oncologic Denies history of Received Chemotherapy, Received Radiation Psychiatric Denies history of Anorexia/bulimia, Confinement Anxiety Medical A Surgical History Notes nd Cardiovascular Dyslipidemia Endocrine Hypothyroidism Review of Systems (ROS) Constitutional Symptoms (General Health) Denies complaints or symptoms of Fatigue, Fever, Chills, Marked Weight Change. Eyes Complains or has symptoms of Glasses / Contacts - glasses. Ear/Nose/Mouth/Throat Denies complaints or symptoms of Chronic sinus problems or rhinitis. Respiratory Denies complaints or symptoms of Chronic or frequent coughs,  Shortness of Breath. Gastrointestinal Denies complaints or symptoms of Frequent diarrhea, Nausea, Vomiting. Genitourinary Denies complaints or symptoms of Frequent urination. Integumentary (Skin) Complains or has symptoms of Wounds - wounds on left great toe and fourth metatarsal. Neurologic Denies complaints or symptoms of Numbness/parasthesias. Psychiatric Denies complaints or symptoms of Claustrophobia, Suicidal. Objective Constitutional patient is hypertensive.. pulse regular and within target range for patient.[name] respirations regular, non-labored and within target range for patient.[name] temperature within target range for patient.. Well-nourished and well-hydrated in no acute distress. Vitals Time Taken: 1:24 PM, Height: 72 in, Source: Stated, Weight: 205 lbs, Source: Stated, BMI: 27.8, Temperature: 98.0 F, Pulse: 73 bpm, Respiratory Rate: 16 breaths/min, Blood Pressure: 143/86 mmHg, Capillary Blood Glucose: 77 mg/dl. General Notes: glucose per pt report Eyes conjunctiva clear no eyelid edema noted. pupils equal round and reactive to light and accommodation. Ears, Nose, Mouth, and Throat no gross abnormality of ear auricles or external auditory canals. normal hearing noted during conversation. mucus membranes moist. Respiratory normal breathing  without difficulty. Musculoskeletal normal gait and posture. no significant deformity or arthritic changes, no loss or range of motion, no clubbing. Psychiatric this patient is able to make decisions and demonstrates good insight into disease process. Alert and Oriented x 3. pleasant and cooperative. General Notes: Upon inspection patient's wound bed actually showed signs of fairly good granulation at this time. His wounds appear to be doing well except for the left metatarsal head fourth which did require sharp debridement to clear this away he did have some depth to the wound but this was really not that bad at all. Post debridement the  wound bed appears to be doing much better which is great news and I am very hopeful that he will show signs of improvement going forward. Integumentary (Hair, Skin) Wound #4 status is Open. Original cause of wound was Trauma. The wound is located on the Left,Dorsal T Great. The wound measures 0.3cm length x 1.7cm oe width x 0.1cm depth; 0.401cm^2 area and 0.04cm^3 volume. There is Fat Layer (Subcutaneous Tissue) exposed. There is no tunneling or undermining noted. There is a medium amount of serosanguineous drainage noted. The wound margin is flat and intact. There is small (1-33%) pink granulation within the wound bed. There is a large (67-100%) amount of necrotic tissue within the wound bed including Adherent Slough. Wound #5 status is Open. Original cause of wound was Shear/Friction. The wound is located on the Left Metatarsal head fourth. The wound measures 1.5cm length x 3cm width x 0.2cm depth; 3.534cm^2 area and 0.707cm^3 volume. There is Fat Layer (Subcutaneous Tissue) exposed. There is no tunneling or undermining noted. There is a medium amount of serosanguineous drainage noted. The wound margin is well defined and not attached to the wound base. There is large (67-100%) red granulation within the wound bed. There is no necrotic tissue within the wound bed. Wound #6 status is Open. Original cause of wound was Shear/Friction. The wound is located on the Right T Second. The wound measures 0.3cm length x oe 0.3cm width x 0.1cm depth; 0.071cm^2 area and 0.007cm^3 volume. There is Fat Layer (Subcutaneous Tissue) exposed. There is no tunneling or undermining noted. There is a medium amount of serosanguineous drainage noted. The wound margin is flat and intact. There is medium (34-66%) pink granulation within the wound bed. There is a medium (34-66%) amount of necrotic tissue within the wound bed including Adherent Slough. Assessment Active Problems ICD-10 Type 2 diabetes mellitus with foot  ulcer Non-pressure chronic ulcer of other part of left foot with fat layer exposed Non-pressure chronic ulcer of other part of right foot with fat layer exposed Type 2 diabetes mellitus with diabetic neuropathy, unspecified Essential (primary) hypertension Procedures Wound #5 Pre-procedure diagnosis of Wound #5 is a Diabetic Wound/Ulcer of the Lower Extremity located on the Left Metatarsal head fourth .Severity of Tissue Pre Debridement is: Fat layer exposed. There was a Excisional Skin/Subcutaneous Tissue Debridement with a total area of 4.5 sq cm performed by Worthy Keeler, PA. With the following instrument(s): Curette to remove Viable and Non-Viable tissue/material. Material removed includes Callus, Subcutaneous Tissue, and Slough after achieving pain control using Other (benzocaine 20% spray). No specimens were taken. A time out was conducted at 14:00, prior to the start of the procedure. A Minimum amount of bleeding was controlled with Pressure. The procedure was tolerated well with a pain level of 0 throughout and a pain level of 0 following the procedure. Post Debridement Measurements: 1.5cm length x 3cm width x 0.2cm  depth; 0.707cm^3 volume. Character of Wound/Ulcer Post Debridement is improved. Severity of Tissue Post Debridement is: Fat layer exposed. Post procedure Diagnosis Wound #5: Same as Pre-Procedure Plan Follow-up Appointments: Return Appointment in 1 week. Bathing/ Shower/ Hygiene: May shower and wash wound with soap and water. Off-Loading: Wedge shoe to: - left foot to ambulate Consults ordered were: Orthotist - [facility] to fit for diabetic inserts and/or shoes WOUND #4: - T Great Wound Laterality: Dorsal, Left oe Cleanser: Soap and Water Every Other Day/30 Days Discharge Instructions: May shower and wash wound with dial antibacterial soap and water prior to dressing change. Prim Dressing: Promogran Prisma Matrix, 4.34 (sq in) (silver collagen) Every  Other Day/30 Days ary Discharge Instructions: Moisten collagen with saline or hydrogel Secondary Dressing: Woven Gauze Sponges 2x2 in (DME) (Generic) Every Other Day/30 Days Discharge Instructions: Apply over primary dressing as directed. Secured With: Child psychotherapist, Sterile 2x75 (in/in) (DME) (Generic) Every Other Day/30 Days Discharge Instructions: Secure with stretch gauze or bandaid Secured With: Paper T ape, 1x10 (in/yd) (DME) (Generic) Every Other Day/30 Days Discharge Instructions: Secure dressing with tape as directed. WOUND #5: - Metatarsal head fourth Wound Laterality: Left Cleanser: Soap and Water Every Other Day/30 Days Discharge Instructions: May shower and wash wound with dial antibacterial soap and water prior to dressing change. Prim Dressing: Promogran Prisma Matrix, 4.34 (sq in) (silver collagen) (DME) (Dispense As Written) Every Other Day/30 Days ary Discharge Instructions: Moisten collagen with saline or hydrogel Secondary Dressing: Woven Gauze Sponges 2x2 in (DME) (Generic) Every Other Day/30 Days Discharge Instructions: Apply over primary dressing as directed. Secondary Dressing: Optifoam Non-Adhesive Dressing, 4x4 in (DME) (Generic) Every Other Day/30 Days Discharge Instructions: Apply over primary dressing cut to make foam donut Secured With: Conforming Stretch Gauze Bandage, Sterile 2x75 (in/in) (DME) (Generic) Every Other Day/30 Days Discharge Instructions: Secure with stretch gauze as directed. Secured With: Paper T ape, 2x10 (in/yd) (DME) (Generic) Every Other Day/30 Days Discharge Instructions: Secure dressing with tape as directed. WOUND #6: - T Second Wound Laterality: Right oe Prim Dressing: Promogran Prisma Matrix, 4.34 (sq in) (silver collagen) Every Other Day/30 Days ary Discharge Instructions: Moisten collagen with saline or hydrogel Secondary Dressing: Woven Gauze Sponges 2x2 in (DME) (Generic) Every Other Day/30 Days Discharge  Instructions: Apply over primary dressing as directed. Secured With: Child psychotherapist, Sterile 2x75 (in/in) (DME) (Generic) Every Other Day/30 Days Discharge Instructions: Secure with stretch gauze or bandaid Secured With: Paper T ape, 1x10 (in/yd) (DME) (Generic) Every Other Day/30 Days Discharge Instructions: Secure dressing with tape as directed. 1. I am good recommend currently that we go ahead and initiate treatment with collagen to all 3 wound locations. I think this is a good option for him. 2. Also can recommend that we attempt to offload the plantar aspect of his foot is much as possible to try to help prevent any pressure slowing down the healing here. 3. I am also going to suggest the patient continue to monitor for any signs of worsening or infection. If anything worsens or changes in that regard he should let me know soon as possible. We will see patient back for reevaluation in 1 week here in the clinic. If anything worsens or changes patient will contact our office for additional recommendations. Electronic Signature(s) Signed: 09/14/2020 6:04:43 PM By: Worthy Keeler PA-C Entered By: Worthy Keeler on 09/14/2020 18:04:43 -------------------------------------------------------------------------------- HxROS Details Patient Name: Date of Service: [name], [name] 09/14/2020  1:15 PM Medical Record Number: [id number] Patient Account Number: [id number] Date of Birth/Sex: Treating RN: 1960-09-03 (60 y.o. Janyth Contes Primary Care Provider: [name] Other Clinician: Referring Provider: Treating Provider/Extender: Moishe Spice in Treatment: 0 Information Obtained From Patient Constitutional Symptoms (General Health) Complaints and Symptoms: Negative for: Fatigue; Fever; Chills; Marked Weight Change Eyes Complaints and Symptoms: Positive for: Glasses / Contacts - glasses Medical History: Negative for: Cataracts;  Glaucoma; Optic Neuritis Ear/Nose/Mouth/Throat Complaints and Symptoms: Negative for: Chronic sinus problems or rhinitis Medical History: Negative for: Chronic sinus problems/congestion Respiratory Complaints and Symptoms: Negative for: Chronic or frequent coughs; Shortness of Breath Medical History: Negative for: Aspiration; Asthma; Chronic Obstructive Pulmonary Disease (COPD); Pneumothorax; Sleep Apnea; Tuberculosis Gastrointestinal Complaints and Symptoms: Negative for: Frequent diarrhea; Nausea; Vomiting Medical History: Negative for: Cirrhosis ; Colitis; Crohns; Hepatitis A; Hepatitis B; Hepatitis C Genitourinary Complaints and Symptoms: Negative for: Frequent urination Integumentary (Skin) Complaints and Symptoms: Positive for: Wounds - wounds on left great toe and fourth metatarsal Medical History: Negative for: History of Burn Neurologic Complaints and Symptoms: Negative for: Numbness/parasthesias Medical History: Negative for: Dementia; Neuropathy; Quadriplegia; Paraplegia; Seizure Disorder Psychiatric Complaints and Symptoms: Negative for: Claustrophobia; Suicidal Medical History: Negative for: Anorexia/bulimia; Confinement Anxiety Hematologic/Lymphatic Medical History: Negative for: Anemia; Hemophilia; Human Immunodeficiency Virus; Lymphedema; Sickle Cell Disease Cardiovascular Medical History: Positive for: Coronary Artery Disease; Hypertension Negative for: Angina; Arrhythmia; Congestive Heart Failure; Deep Vein Thrombosis; Hypotension; Myocardial Infarction; Peripheral Arterial Disease; Peripheral Venous Disease; Phlebitis; Vasculitis Past Medical History Notes: Dyslipidemia Endocrine Medical History: Positive for: Type II Diabetes Negative for: Type I Diabetes Past Medical History Notes: Hypothyroidism Time with diabetes: 15 years Treated with: Oral agents Blood sugar tested every day: No Immunological Medical History: Negative for: Lupus  Erythematosus; Raynauds; Scleroderma Musculoskeletal Medical History: Positive for: Gout Negative for: Rheumatoid Arthritis; Osteoarthritis; Osteomyelitis Oncologic Medical History: Negative for: Received Chemotherapy; Received Radiation Immunizations Pneumococcal Vaccine: Received Pneumococcal Vaccination: No Implantable Devices None Family and Social History Cancer: Yes - Mother,Father; Diabetes: Yes - Father,Paternal Grandparents,Child; Heart Disease: Yes - Mother,Maternal Grandparents,Father,Siblings; Hereditary Spherocytosis: No; Hypertension: Yes - Maternal Grandparents,Paternal Grandparents,Mother,Father,Siblings; Kidney Disease: No; Lung Disease: Yes - Father; Seizures: No; Stroke: No; Thyroid Problems: Yes - Mother; Tuberculosis: No; Never smoker; Marital Status - Married; Alcohol Use: Moderate; Drug Use: No History; Caffeine Use: Daily; Financial Concerns: No; Food, Clothing or Shelter Needs: No; Support System Lacking: No; Transportation Concerns: No Electronic Signature(s) Signed: 09/14/2020 6:05:28 PM By: Worthy Keeler PA-C Signed: 10/04/2020 4:03:25 PM By: Levan Hurst RN, BSN Signed: 10/04/2020 4:03:25 PM By: Levan Hurst RN, BSN Entered By: Levan Hurst on 09/14/2020 13:31:21 -------------------------------------------------------------------------------- SuperBill Details Patient Name: Date of Service: [name], [name] 09/14/2020 Medical Record Number: [id number] Patient Account Number: [id number] Date of Birth/Sex: Treating RN: [birth year] (60 y.o. Ernestene Mention Primary Care Provider: [name] Other Clinician: Referring Provider: Treating Provider/Extender: Moishe Spice in Treatment: 0 Diagnosis Coding ICD-10 Codes Code Description [id number] Type 2 diabetes mellitus with foot ulcer L97.522 Non-pressure chronic ulcer of other part of left foot with fat layer exposed L97.512 Non-pressure chronic ulcer of other part of  right foot with fat layer exposed E11.40 Type 2 diabetes mellitus with diabetic neuropathy, unspecified I10 Essential (primary) hypertension Facility Procedures CPT4 Code: [id number] Description: 99213 - WOUND CARE VISIT-LEV 3 EST PT Modifier: 25 Quantity: 1 CPT4 Code: [id number] Description: 11042 - DEB SUBQ TISSUE 20 SQ CM/< ICD-10 Diagnosis Description L97.522  Non-pressure chronic ulcer of other part of left foot with fat layer exposed Modifier: Quantity: 1 Physician Procedures : CPT4 Code Description Modifier [id number] 09400 - WC PHYS LEVEL 3 - EST PT 25 ICD-10 Diagnosis Description E11.621 Type 2 diabetes mellitus with foot ulcer L97.522 Non-pressure chronic ulcer of other part of left foot with fat layer exposed L97.512  Non-pressure chronic ulcer of other part of right foot with fat layer exposed E11.40 Type 2 diabetes mellitus with diabetic neuropathy, unspecified Quantity: 1 : [id number] - WC PHYS SUBQ TISS 20 SQ CM ICD-10 Diagnosis Description L97.522 Non-pressure chronic ulcer of other part of left foot with fat layer exposed Quantity: 1 Electronic Signature(s) Signed: 09/14/2020 6:04:57 PM By: Worthy Keeler PA-C Previous Signature: 09/14/2020 5:51:30 PM Version By: Baruch Gouty RN, BSN Entered By: Worthy Keeler on 09/14/2020 18:04:56

## 2020-10-04 NOTE — Progress Notes (Signed)
DECLYN, DELSOL T ([id number]) . Visit Report for 09/14/2020 Allergy List Details Patient Name: Date of Service: Robert Huang 09/14/2020 1:15 PM Medical Record Number: [id number] Patient Account Number: [id number] Date of Birth/Sex: Treating RN: 1960/09/09 (60 y.o. Robert Huang Primary Care Robert Huang: Robert Huang Other Clinician: Referring Robert Huang: Treating Robert Huang/Extender: Robert Huang in Treatment: 0 Allergies Active Allergies No Known Drug Allergies Type: Allergen Allergy Notes Electronic Signature(s) Signed: 10/04/2020 4:03:25 PM By: Robert Abts RN, BSN Entered By: Robert Huang on 09/14/2020 13:25:42 -------------------------------------------------------------------------------- Arrival Information Details Patient Name: Date of Service: Robert Huang, Robert Debar T. 09/14/2020 1:15 PM Medical Record Number: [id number] Patient Account Number: [id number] Date of Birth/Sex: Treating RN: 10/22/1960 (59 y.o. Robert Huang Primary Care Robert Huang: Robert Huang Other Clinician: Referring Robert Huang: Treating Robert Huang in Treatment: 0 Visit Information Patient Arrived: Ambulatory Arrival Time: 13:23 Accompanied By: alone Transfer Assistance: None Patient Identification Verified: Yes Secondary Verification Process Completed: Yes Patient Requires Transmission-Based Precautions: No Patient Has Alerts: Yes Patient Alerts: R ABI: 0.96 L ABI: 1.19 History Since Last Visit Added or deleted any medications: No Any new allergies or adverse reactions: No Had a fall or experienced change in activities of daily living that may affect risk of falls: No Signs or symptoms of abuse/neglect since last visito No Hospitalized since last visit: No Implantable device outside of the clinic excluding cellular tissue based products placed in the center since last visit: No Pain Present Now: No Electronic  Signature(s) Signed: 10/04/2020 4:03:25 PM By: Robert Abts RN, BSN Entered By: Robert Huang on 09/14/2020 13:38:38 -------------------------------------------------------------------------------- Clinic Level of Care Assessment Details Patient Name: Date of Service: Robert Cook T. 09/14/2020 1:15 PM Medical Record Number: [id number] Patient Account Number: [id number] Date of Birth/Sex: Treating RN: 01/20/1961 (59 y.o. Robert Huang Primary Care Robert Huang: Robert Huang Other Clinician: Referring Robert Huang: Treating Robert Huang in Treatment: 0 Clinic Level of Care Assessment Items TOOL 1 Quantity Score []  - 0 Use when EandM and Procedure is performed on INITIAL visit ASSESSMENTS - Nursing Assessment / Reassessment X- 1 20 General Physical Exam (combine w/ comprehensive assessment (listed just below) when performed on new pt. evals) X- 1 25 Comprehensive Assessment (HX, ROS, Risk Assessments, Wounds Hx, etc.) ASSESSMENTS - Wound and Skin Assessment / Reassessment []  - 0 Dermatologic / Skin Assessment (not related to wound area) ASSESSMENTS - Ostomy and/or Continence Assessment and Care []  - 0 Incontinence Assessment and Management []  - 0 Ostomy Care Assessment and Management (repouching, etc.) PROCESS - Coordination of Care X - Simple Patient / Family Education for ongoing care 1 15 []  - 0 Complex (extensive) Patient / Family Education for ongoing care X- 1 10 Staff obtains , Records, T Results / Process Orders est []  - 0 Staff telephones HHA, Nursing Homes / Clarify orders / etc []  - 0 Routine Transfer to another Facility (non-emergent condition) []  - 0 Routine Hospital Admission (non-emergent condition) X- 1 15 New Admissions / / Ordering NPWT Apligraf, etc. , []  - 0 Emergency Hospital Admission (emergent condition) PROCESS - Special Needs []  - 0 Pediatric / Minor Patient  Management []  - 0 Isolation Patient Management []  - 0 Hearing / Language / Visual special needs []  - 0 Assessment of Community assistance (transportation, D/C planning, etc.) []  - 0 Additional assistance / Altered mentation []  - 0 Support  Surface(s) Assessment (bed, cushion, seat, etc.) INTERVENTIONS - Miscellaneous []  - 0 External ear exam []  - 0 Patient Transfer (multiple staff / / Similar devices) []  - 0 Simple Staple / Suture removal (25 or less) []  - 0 Complex Staple / Suture removal (26 or more) []  - 0 Hypo/Hyperglycemic Management (do not check if billed separately) []  - 0 Ankle / Brachial Index (ABI) - do not check if billed separately Has the patient been seen at the hospital within the last three years: Yes Total Score: 85 Level Of Care: New/Established - Level 3 Electronic Signature(s) Signed: 09/14/2020 5:51:30 PM By: Nurse, adult RN, BSN Signed: 09/14/2020 5:51:30 PM By: RN, BSN Entered By: on 09/14/2020 14:06:02 -------------------------------------------------------------------------------- Encounter Discharge Information Details Patient Name: Date of Service: 09/16/2020, Robert Deed T. 09/14/2020 1:15 PM Medical Record Number: Robert Deed Patient Account Number: Robert Deed Date of Birth/Sex: Treating RN: 1961/02/17 (59 y.o. Robert Huang, Robert Huang Primary Care Burk Hoctor: Robert Debar Other Clinician: Referring Whitney Hillegass: Treating [name] 09/16/2020 in Treatment: 0 Encounter Discharge Information Items Post Procedure Vitals Discharge Condition: Stable Temperature (F): 98 Ambulatory Status: Ambulatory Pulse (bpm): 73 Discharge Destination: Home Respiratory Rate (breaths/min): 17 Transportation: Private Auto Blood Pressure (mmHg): 143/86 Accompanied By: self Schedule Follow-up Appointment: Yes Clinical Summary of Care: Patient Declined Electronic Signature(s) Signed: 09/14/2020  5:25:13 PM By: [id number] RN Entered By: 12/14/1960 on 09/14/2020 14:47:48 -------------------------------------------------------------------------------- Lower Extremity Assessment Details Patient Name: Date of Service: Robert Huang, Robert Huang T. 09/14/2020 1:15 PM Medical Record Number: Fonnie Mu Patient Account Number: Fonnie Mu Date of Birth/Sex: Treating RN: April 12, 1961 (59 y.o. Robert Huang, Robert Debar Primary Care Anand Tejada: 09/16/2020 Other Clinician: Referring Jawanza Zambito: Treating Rector Devonshire/Extender: [id number] in Treatment: 0 Edema Assessment Assessed: [id number]: No] [Right: No] Edema: [Left: Yes] [Right: Yes] Calf Left: Right: Point of Measurement: 35 cm From Medial Instep 37.2 cm 33 cm Ankle Left: Right: Point of Measurement: 11 cm From Medial Instep 22 cm 22 cm Knee To Floor Left: Right: From Medial Instep 49 cm 49 cm Vascular Assessment Pulses: Dorsalis Pedis Palpable: [Left:Yes] [Right:Yes] Electronic Signature(s) Signed: 10/04/2020 4:03:25 PM By: Daphane Shepherd RN, BSN Entered By: Emeterio Reeve on 09/14/2020 13:38:10 -------------------------------------------------------------------------------- Multi-Disciplinary Care Plan Details Patient Name: Date of Service: Robert Huang, Robert Searles T. 09/14/2020 1:15 PM Medical Record Number: Robert Huang Patient Account Number: Robert Huang Date of Birth/Sex: Treating RN: Jun 05, 1961 (59 y.o. Robert Huang Primary Care Kentavius Dettore: Robert Debar Other Clinician: Referring Norberto Wishon: Treating Nike Southwell/Extender: 09/16/2020 in Treatment: 0 Active Inactive Abuse / Safety / Falls / Self Care Management Nursing Diagnoses: Potential for falls Goals: Patient/caregiver will verbalize/demonstrate measures taken to prevent injury and/or falls Date Initiated: 09/14/2020 Target Resolution Date: 10/12/2020 Goal Status: Active Interventions: Assess fall risk on admission and as  needed Assess impairment of mobility on admission and as needed per policy Notes: Nutrition Nursing Diagnoses: Impaired glucose control: actual or potential Potential for alteratiion in Nutrition/Potential for imbalanced nutrition Goals: Patient/caregiver will maintain therapeutic glucose control Date Initiated: 09/14/2020 Target Resolution Date: 10/12/2020 Goal Status: Active Interventions: Assess patient nutrition upon admission and as needed per policy Provide education on elevated blood sugars and impact on wound healing Treatment Activities: Patient referred to Primary Care Physician for further nutritional evaluation : 09/14/2020 Notes: Wound/Skin Impairment Nursing Diagnoses: Impaired tissue integrity Knowledge deficit related to ulceration/compromised skin integrity Goals: Patient/caregiver will verbalize understanding  of skin care regimen Date Initiated: 09/14/2020 Target Resolution Date: 10/12/2020 Goal Status: Active Ulcer/skin breakdown will have a volume reduction of 30% by week 4 Date Initiated: 09/14/2020 Target Resolution Date: 10/12/2020 Goal Status: Active Interventions: Assess patient/caregiver ability to obtain necessary supplies Assess patient/caregiver ability to perform ulcer/skin care regimen upon admission and as needed Assess ulceration(s) every visit Treatment Activities: Topical wound management initiated : 09/14/2020 Notes: Electronic Signature(s) Signed: 09/14/2020 5:51:30 PM By: Robert Deed RN, BSN Entered By: Robert Deed on 09/14/2020 14:02:36 -------------------------------------------------------------------------------- Pain Assessment Details Patient Name: Date of Service: Robert Huang, Robert Debar T. 09/14/2020 1:15 PM Medical Record Number: [id number] Patient Account Number: [id number] Date of Birth/Sex: Treating RN: 09-18-1960 (59 y.o. Robert Huang Primary Care Zilah Villaflor: Robert Huang Other Clinician: Referring Makoa Satz: Treating  Randal Yepiz/Extender: Robert Huang in Treatment: 0 Active Problems Location of Pain Severity and Description of Pain Patient Has Paino Yes Site Locations Pain Location: Pain in Ulcers With Dressing Change: Yes Rate the pain. Current Pain Level: 3 Character of Pain Describe the Pain: Dull Pain Management and Medication Current Pain Management: Medication: Yes Cold Application: No Rest: No Massage: No Activity: No HuangE.N.S.: No Heat Application: No Leg drop or elevation: No Is the Current Pain Management Adequate: Adequate How does your wound impact your activities of daily livingo Sleep: No Bathing: No Appetite: No Relationship With Others: No Bladder Continence: No Emotions: No Bowel Continence: No Work: No Toileting: No Drive: No Dressing: No Hobbies: No Electronic Signature(s) Signed: 10/04/2020 4:03:25 PM By: Robert Abts RN, BSN Entered By: Robert Huang on 09/14/2020 13:43:39 -------------------------------------------------------------------------------- Patient/Caregiver Education Details Patient Name: Date of Service: Robert Huang 1/19/2022andnbsp1:15 PM Medical Record Number: [id number] Patient Account Number: [id number] Date of Birth/Gender: Treating RN: October 14, 1960 (60 y.o. Robert Huang Primary Care Physician: Robert Huang Other Clinician: Referring Physician: Treating Physician/Extender: Robert Huang in Treatment: 0 Education Assessment Education Provided To: Patient Education Topics Provided Elevated Blood Sugar/ Impact on Healing: Methods: Explain/Verbal Responses: Reinforcements needed, State content correctly Offloading: Handouts: What is Offloadingo Methods: Explain/Verbal, Printed Responses: Reinforcements needed, State content correctly Wound/Skin Impairment: Handouts: Caring for Your Ulcer, Skin Care Do's and Dont's Methods: Explain/Verbal, Printed Responses:  Reinforcements needed, State content correctly Electronic Signature(s) Signed: 09/14/2020 5:51:30 PM By: Robert Deed RN, BSN Entered By: Robert Deed on 09/14/2020 14:04:34 -------------------------------------------------------------------------------- Wound Assessment Details Patient Name: Date of Service: Robert Huang, Robert Debar T. 09/14/2020 1:15 PM Medical Record Number: [id number] Patient Account Number: [id number] Date of Birth/Sex: Treating RN: 02-05-1961 (59 y.o. Robert Huang Primary Care Kapil Petropoulos: Robert Huang Other Clinician: Referring Karalee Hauter: Treating Nechuma Boven/Extender: Robert Huang in Treatment: 0 Wound Status Wound Number: 4 Primary Diabetic Wound/Ulcer of the Lower Extremity Etiology: Wound Location: Left, Dorsal T Great oe Wound Status: Open Wounding Event: Trauma Comorbid Coronary Artery Disease, Hypertension, Type II Diabetes, Date Acquired: 09/09/2020 History: Gout Weeks Of Treatment: 0 Clustered Wound: No Photos Photo Uploaded By: Benjaman Kindler on 09/21/2020 11:09:31 Wound Measurements Length: (cm) 0.3 Width: (cm) 1.7 Depth: (cm) 0.1 Area: (cm) 0.401 Volume: (cm) 0.04 % Reduction in Area: % Reduction in Volume: Epithelialization: None Tunneling: No Undermining: No Wound Description Classification: Grade 2 Wound Margin: Flat and Intact Exudate Amount: Medium Exudate Type: Serosanguineous Exudate Color: red, brown Foul Odor After Cleansing: No Slough/Fibrino Yes Wound Bed Granulation Amount: Small (1-33%) Exposed Structure Granulation Quality: Pink Fascia Exposed: No Necrotic  Amount: Large (67-100%) Fat Layer (Subcutaneous Tissue) Exposed: Yes Necrotic Quality: Adherent Slough Tendon Exposed: No Muscle Exposed: No Joint Exposed: No Bone Exposed: No Electronic Signature(s) Signed: 10/04/2020 4:03:25 PM By: Robert AbtsLynch, Shatara RN, BSN Entered By: Robert AbtsLynch, Shatara on 09/14/2020  13:40:00 -------------------------------------------------------------------------------- Wound Assessment Details Patient Name: Date of Service: Robert SacramentoGRINDSTA FF, Robert DebarA LA N T. 09/14/2020 1:15 PM Medical Record Number: [id number] Patient Account Number: [id number] Date of Birth/Sex: Treating RN: 10-10-1960 (59 y.o. Robert KollerM) Lynch, Shatara Primary Care Tim Wilhide: Robert SalisburyFry, Stephen A Other Clinician: Referring Shallyn Constancio: Treating Khalen Styer/Extender: Robert LeatherStone III, Hoyt Fry, Stephen A Weeks in Treatment: 0 Wound Status Wound Number: 5 Primary Diabetic Wound/Ulcer of the Lower Extremity Etiology: Wound Location: Left Metatarsal head fourth Wound Status: Open Wounding Event: Shear/Friction Comorbid Coronary Artery Disease, Hypertension, Type II Diabetes, Date Acquired: 08/29/2020 History: Gout Weeks Of Treatment: 0 Clustered Wound: No Photos Photo Uploaded By: Benjaman KindlerJones, Dedrick on 09/21/2020 11:09:31 Wound Measurements Length: (cm) 1.5 Width: (cm) 3 Depth: (cm) 0.2 Area: (cm) 3.534 Volume: (cm) 0.707 % Reduction in Area: % Reduction in Volume: Epithelialization: None Tunneling: No Undermining: No Wound Description Classification: Grade 2 Wound Margin: Well defined, not attached Exudate Amount: Medium Exudate Type: Serosanguineous Exudate Color: red, brown Foul Odor After Cleansing: No Slough/Fibrino Yes Wound Bed Granulation Amount: Large (67-100%) Exposed Structure Granulation Quality: Red Fascia Exposed: No Necrotic Amount: None Present (0%) Fat Layer (Subcutaneous Tissue) Exposed: Yes Tendon Exposed: No Muscle Exposed: No Joint Exposed: No Bone Exposed: No Electronic Signature(s) Signed: 10/04/2020 4:03:25 PM By: Robert AbtsLynch, Shatara RN, BSN Entered By: Robert AbtsLynch, Shatara on 09/14/2020 13:41:59 -------------------------------------------------------------------------------- Wound Assessment Details Patient Name: Date of Service: Robert SacramentoGRINDSTA FF, Robert DebarA LA N T. 09/14/2020 1:15 PM Medical Record Number:  [id number] Patient Account Number: [id number] Date of Birth/Sex: Treating RN: 10-10-1960 (59 y.o. Robert KollerM) Lynch, Shatara Primary Care Marquell Saenz: Robert SalisburyFry, Stephen A Other Clinician: Referring Casimer Russett: Treating Kenith Trickel/Extender: Robert LeatherStone III, Hoyt Fry, Stephen A Weeks in Treatment: 0 Wound Status Wound Number: 6 Primary Diabetic Wound/Ulcer of the Lower Extremity Etiology: Wound Location: Right T Second oe Wound Status: Open Wounding Event: Shear/Friction Comorbid Coronary Artery Disease, Hypertension, Type II Diabetes, Date Acquired: 06/27/2020 History: Gout Weeks Of Treatment: 0 Clustered Wound: No Photos Photo Uploaded By: Benjaman KindlerJones, Dedrick on 09/16/2020 10:47:38 Wound Measurements Length: (cm) 0.3 Width: (cm) 0.3 Depth: (cm) 0.1 Area: (cm) 0.071 Volume: (cm) 0.007 % Reduction in Area: % Reduction in Volume: Epithelialization: None Tunneling: No Undermining: No Wound Description Classification: Grade 2 Wound Margin: Flat and Intact Exudate Amount: Medium Exudate Type: Serosanguineous Exudate Color: red, brown Foul Odor After Cleansing: No Slough/Fibrino Yes Wound Bed Granulation Amount: Medium (34-66%) Exposed Structure Granulation Quality: Pink Fascia Exposed: No Necrotic Amount: Medium (34-66%) Fat Layer (Subcutaneous Tissue) Exposed: Yes Necrotic Quality: Adherent Slough Tendon Exposed: No Muscle Exposed: No Joint Exposed: No Bone Exposed: No Electronic Signature(s) Signed: 10/04/2020 4:03:25 PM By: Robert AbtsLynch, Shatara RN, BSN Entered By: Robert AbtsLynch, Shatara on 09/14/2020 13:43:11 -------------------------------------------------------------------------------- Vitals Details Patient Name: Date of Service: Robert SacramentoGRINDSTA FF, Robert DebarA LA N T. 09/14/2020 1:15 PM Medical Record Number: [id number] Patient Account Number: [id number] Date of Birth/Sex: Treating RN: 10-10-1960 (59 y.o. Robert KollerM) Lynch, Shatara Primary Care Isaid Salvia: Robert SalisburyFry, Stephen A Other Clinician: Referring Trudy Kory: Treating  Dalen Hennessee/Extender: Robert LeatherStone III, Hoyt Fry, Stephen A Weeks in Treatment: 0 Vital Signs Time Taken: 13:24 Temperature (F): 98.0 Height (in): 72 Pulse (bpm): 73 Source: Stated Respiratory Rate (breaths/min): 16 Weight (lbs): 205 Blood Pressure (mmHg): 143/86 Source: Stated Capillary Blood Glucose (mg/dl): 77 Body Mass Index (BMI): 27.8 Reference  Range: 80 - 120 mg / dl Notes glucose per pt report Electronic Signature(s) Signed: 10/04/2020 4:03:25 PM By: Robert Abts RN, BSN Entered By: Robert Huang on 09/14/2020 13:25:17

## 2020-10-04 NOTE — Progress Notes (Signed)
Robert, SARDINA Huang ([id number]) . Visit Report for 09/14/2020 Abuse/Suicide Risk Screen Details Patient Name: Date of Service: Robert Huang 09/14/2020 1:15 PM Medical Record Number: [id number] Patient Account Number: [id number] Date of Birth/Sex: Treating RN: 09-03-1960 (60 y.o. Robert Huang Primary Care Jaryan Chicoine: [name] Other Clinician: Referring Deanie Jupiter: Treating Jari Carollo/Extender: Lucie Leather in Treatment: 0 Abuse/Suicide Risk Screen Items Answer ABUSE RISK SCREEN: Has anyone close to you tried to hurt or harm you recentlyo No Do you feel uncomfortable with anyone in your familyo No Has anyone forced you do things that you didnt want to doo No Electronic Signature(s) Signed: 10/04/2020 4:03:25 PM By: Zandra Abts RN, BSN Entered By: Zandra Abts on 09/14/2020 13:31:27 -------------------------------------------------------------------------------- Activities of Daily Living Details Patient Name: Date of Service: Robert Huang 09/14/2020 1:15 PM Medical Record Number: [id number] Patient Account Number: [id number] Date of Birth/Sex: Treating RN: [birth year] (60 y.o. Robert Huang Primary Care Robert Huang: [name] Other Clinician: Referring Robert Huang: Treating Robert Huang/Extender: Lucie Leather in Treatment: 0 Activities of Daily Living Items Answer Activities of Daily Living (Please select one for each item) Drive Automobile Completely Able Huang Medications ake Completely Able Use Huang elephone Completely Able Care for Appearance Completely Able Use Huang oilet Completely Able Bath / Shower Completely Able Dress Self Completely Able Feed Self Completely Able Walk Completely Able Get In / Out Bed Completely Able Housework Completely Able Prepare Meals Completely Able Handle Money Completely Able Shop for Self Completely Able Electronic Signature(s) Signed: 10/04/2020 4:03:25 PM By: Zandra Abts  RN, BSN Entered By: Zandra Abts on 09/14/2020 13:31:43 -------------------------------------------------------------------------------- Education Screening Details Patient Name: Date of Service: Robert Huang, Robert Debar Huang. 09/14/2020 1:15 PM Medical Record Number: [id number] Patient Account Number: [id number] Date of Birth/Sex: Treating RN: [birth year] (59 y.o. Robert Huang Primary Care Burgandy Hackworth: [name] Other Clinician: Referring Robert Huang: Treating Robert Huang/Extender: Lucie Leather in Treatment: 0 Primary Learner Assessed: Patient Learning Preferences/Education Level/Primary Language Learning Preference: Explanation Highest Education Level: College or Above Preferred Language: English Cognitive Barrier Language Barrier: No Translator Needed: No Memory Deficit: No Emotional Barrier: No Cultural/Religious Beliefs Affecting Medical Care: No Physical Barrier Impaired Vision: No Impaired Hearing: No Decreased Hand dexterity: No Knowledge/Comprehension Knowledge Level: High Comprehension Level: High Ability to understand written instructions: High Ability to understand verbal instructions: High Motivation Anxiety Level: Calm Cooperation: Cooperative Education Importance: Acknowledges Need Interest in Health Problems: Asks Questions Perception: Coherent Willingness to Engage in Self-Management High Activities: Readiness to Engage in Self-Management High Activities: Electronic Signature(s) Signed: 10/04/2020 4:03:25 PM By: Zandra Abts RN, BSN Entered By: Zandra Abts on 09/14/2020 13:32:12 -------------------------------------------------------------------------------- Fall Risk Assessment Details Patient Name: Date of Service: Robert Huang, Robert Debar Huang. 09/14/2020 1:15 PM Medical Record Number: [id number] Patient Account Number: [id number] Date of Birth/Sex: Treating RN: [birth year] (59 y.o. Robert Huang Primary Care Robert Huang: [name] Other Clinician: Referring Robert Huang: Treating Robert Huang/Extender: Lucie Leather in Treatment: 0 Fall Risk Assessment Items Have you had 2 or more falls in the last 12 monthso 0 Yes Have you had any fall that resulted in injury in the last 12 monthso 0 No FALLS RISK SCREEN History of falling - immediate or within 3 months 25 Yes Secondary diagnosis (Do you have 2 or more medical diagnoseso) 0 No Ambulatory aid None/bed rest/wheelchair/nurse 0 Yes Crutches/cane/walker 0 No Furniture  0 No Intravenous therapy Access/Saline/Heparin Lock 0 No Gait/Transferring Normal/ bed rest/ wheelchair 0 Yes Weak (short steps with or without shuffle, stooped but able to lift head while walking, may seek 0 No support from furniture) Impaired (short steps with shuffle, may have difficulty arising from chair, head down, impaired 0 No balance) Mental Status Oriented to own ability 0 Yes Electronic Signature(s) Signed: 10/04/2020 4:03:25 PM By: Zandra Abts RN, BSN Entered By: Zandra Abts on 09/14/2020 13:36:15 -------------------------------------------------------------------------------- Foot Assessment Details Patient Name: Date of Service: Robert Huang, Robert Debar Huang. 09/14/2020 1:15 PM Medical Record Number: [id number] Patient Account Number: [id number] Date of Birth/Sex: Treating RN: February 21, 1961 (59 y.o. Robert Huang Primary Care Rondle Lohse: [name] Other Clinician: Referring Robert Huang: Treating Robert Huang/Extender: Lucie Leather in Treatment: 0 Foot Assessment Items Site Locations + = Sensation present, - = Sensation absent, C = Callus, U = Ulcer R = Redness, W = Warmth, M = Maceration, PU = Pre-ulcerative lesion F = Fissure, S = Swelling, D = Dryness Assessment Right: Left: Other Deformity: No No Prior Foot Ulcer: No No Prior Amputation: No No Charcot Joint: No No Ambulatory Status: Ambulatory Without Help Gait:  Steady Electronic Signature(s) Signed: 10/04/2020 4:03:25 PM By: Zandra Abts RN, BSN Entered By: Zandra Abts on 09/14/2020 13:37:17 -------------------------------------------------------------------------------- Nutrition Risk Screening Details Patient Name: Date of Service: Robert Huang, Robert Debar Huang. 09/14/2020 1:15 PM Medical Record Number: [id number] Patient Account Number: [id number] Date of Birth/Sex: Treating RN: 1961/03/17 (59 y.o. Robert Huang Primary Care Yael Angerer: [name] Other Clinician: Referring Jaleiah Asay: Treating Kerissa Coia/Extender: Lucie Leather in Treatment: 0 Height (in): 72 Weight (lbs): 205 Body Mass Index (BMI): 27.8 Nutrition Risk Screening Items Score Screening NUTRITION RISK SCREEN: I have an illness or condition that made me change the kind and/or amount of food I eat 2 Yes I eat fewer than two meals per day 0 No I eat few fruits and vegetables, or milk products 0 No I have three or more drinks of beer, liquor or wine almost every day 0 No I have tooth or mouth problems that make it hard for me to eat 0 No I don'Huang always have enough money to buy the food I need 0 No I eat alone most of the time 0 No I take three or more different prescribed or over-the-counter drugs a day 1 Yes Without wanting to, I have lost or gained 10 pounds in the last six months 0 No I am not always physically able to shop, cook and/or feed myself 0 No Nutrition Protocols Good Risk Protocol Moderate Risk Protocol 0 Provide education on nutrition High Risk Proctocol Risk Level: Moderate Risk Score: 3 Electronic Signature(s) Signed: 10/04/2020 4:03:25 PM By: Zandra Abts RN, BSN Entered By: Zandra Abts on 09/14/2020 13:36:24

## 2020-10-06 NOTE — Telephone Encounter (Addendum)
Lab results were discussed with patient on 10/05/2020

## 2020-10-10 ENCOUNTER — Encounter (HOSPITAL_BASED_OUTPATIENT_CLINIC_OR_DEPARTMENT_OTHER): Payer: No Typology Code available for payment source | Admitting: Internal Medicine

## 2020-10-11 ENCOUNTER — Telehealth: Payer: Self-pay

## 2020-10-11 NOTE — Telephone Encounter (Signed)
Patient is retuning a call from [place].

## 2020-10-13 ENCOUNTER — Encounter (HOSPITAL_BASED_OUTPATIENT_CLINIC_OR_DEPARTMENT_OTHER): Payer: 59 | Admitting: Internal Medicine

## 2020-10-13 ENCOUNTER — Ambulatory Visit (HOSPITAL_COMMUNITY)
Admission: RE | Admit: 2020-10-13 | Discharge: 2020-10-13 | Disposition: A | Discharge status: Home / Self Care | Payer: No Typology Code available for payment source | Source: Ambulatory Visit | Attending: Internal Medicine | Admitting: Internal Medicine

## 2020-10-13 ENCOUNTER — Other Ambulatory Visit (HOSPITAL_COMMUNITY): Payer: Self-pay | Admitting: Internal Medicine

## 2020-10-13 ENCOUNTER — Other Ambulatory Visit (HOSPITAL_COMMUNITY)
Admission: RE | Admit: 2020-10-13 | Discharge: 2020-10-13 | Disposition: A | Discharge status: Home / Self Care | Payer: No Typology Code available for payment source | Attending: Internal Medicine | Admitting: Internal Medicine

## 2020-10-13 ENCOUNTER — Other Ambulatory Visit: Payer: Self-pay

## 2020-10-13 DIAGNOSIS — E11621 Type 2 diabetes mellitus with foot ulcer: Secondary | ICD-10-CM | POA: Insufficient documentation

## 2020-10-13 DIAGNOSIS — L97509 Non-pressure chronic ulcer of other part of unspecified foot with unspecified severity: Secondary | ICD-10-CM | POA: Insufficient documentation

## 2020-10-13 DIAGNOSIS — L97512 Non-pressure chronic ulcer of other part of right foot with fat layer exposed: Secondary | ICD-10-CM | POA: Diagnosis not present

## 2020-10-13 DIAGNOSIS — E1169 Type 2 diabetes mellitus with other specified complication: Secondary | ICD-10-CM | POA: Diagnosis not present

## 2020-10-13 DIAGNOSIS — E1151 Type 2 diabetes mellitus with diabetic peripheral angiopathy without gangrene: Secondary | ICD-10-CM | POA: Diagnosis not present

## 2020-10-13 DIAGNOSIS — Z872 Personal history of diseases of the skin and subcutaneous tissue: Secondary | ICD-10-CM | POA: Diagnosis not present

## 2020-10-13 DIAGNOSIS — L03116 Cellulitis of left lower limb: Secondary | ICD-10-CM | POA: Diagnosis not present

## 2020-10-13 DIAGNOSIS — L97522 Non-pressure chronic ulcer of other part of left foot with fat layer exposed: Secondary | ICD-10-CM | POA: Diagnosis not present

## 2020-10-13 DIAGNOSIS — M869 Osteomyelitis, unspecified: Secondary | ICD-10-CM | POA: Diagnosis not present

## 2020-10-13 DIAGNOSIS — E1140 Type 2 diabetes mellitus with diabetic neuropathy, unspecified: Secondary | ICD-10-CM | POA: Diagnosis not present

## 2020-10-13 DIAGNOSIS — Z888 Allergy status to other drugs, medicaments and biological substances status: Secondary | ICD-10-CM | POA: Diagnosis not present

## 2020-10-13 DIAGNOSIS — Z955 Presence of coronary angioplasty implant and graft: Secondary | ICD-10-CM | POA: Diagnosis not present

## 2020-10-13 NOTE — Telephone Encounter (Addendum)
Labs results had been discussed with patient on 10/05/2020

## 2020-10-13 NOTE — Progress Notes (Signed)
Robert Huang ([id number]) . Visit Report for 10/13/2020 HPI Details Patient Name: Date of Service: Robert Huang 10/13/2020 9:00 A M Medical Record Number: [id number] Patient Account Number: [id number] Date of Birth/Sex: Treating RN: 11-13-1960 (60 y.o. [name] Primary Care Provider: [name] Other Clinician: Referring Provider: Treating Provider/Extender: [name] in Treatment: 4 History of Present Illness HPI Description: ADMISSION 05/27/2020 This is a 60 year old man who has type 2 diabetes who is here for wounds on his left foot in 2 locations. The patient has had problems with foot ulcers requiring surgery including a right great toe amputation and a left fifth ray amputation in July 2020 for underlying osteomyelitis although strangely I cannot see any pathology or cultures done in this area. He states that the area on his left fourth met head plantar aspect opened about a month ago. He has had continuous callus in this area. He saw Dr. Sarajane Jews his primary doctor at the beginning of September and was felt to have a cellulitis in the lateral part of his foot in this area he was given a shot of Rocephin and then ongoing antibiotics with Keflex for 10 days. It would appear that he was supposed to follow-up with Dr. Sarajane Jews although I do not see these notes. About a week and a half ago he stubbed his left first toe and has a wound on this area. He is using Neosporin to both wound areas. 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 Sarajane Jews ABI in our clinic was 1.23 on the left 10/8; the patient's area over the left fourth metatarsal head is fully closed. This  was a split callus last week that I debrided. He still has an open area over the tip of his left great toe. He is using a forefoot offloading and silver alginate. We made [facility] an appointment with [facility] to see about custom made diabetic shoes with inserts this appointment is for next Tuesday. Readmission: 09/14/2020 upon evaluation today patient appears to have several wounds over his left dorsal great toe, left fourth metatarsal head, and right second toe. The good news is none of these appear to be severe at this point. Nonetheless unfortunately he is going require a little bit of the debridement today to try to clear away some of the necrotic debris currently. Specifically the left metatarsal head of the fourth metatarsal does look particularly is a wound that is good to require sharp debridement. The patient does have a history of diabetes mellitus type 2, hypertension, and diabetic neuropathy. He has previously been seen here in the clinic and I am not certain that he is ever really have this completely healed although we have not seen him since October 2020. Fortunately his ABIs appear to be doing excellent today. 1/27; patient was readmitted to the clinic last week. He had an area over his right second toe which is healed also of the left dorsal great toe is also healed. He attributes these wounds to a hunting trip he took with boots that perhaps were not helpful on his feet. He still has the area on the fourth met head status post left fifth  ray amputation in July 20 2/3; left fourth metatarsal head. Wound is about the same size as last week some debris around the circumference of the wound including callus and dry skin I removed this knocked down the circumference hopefully to make the surrounding tissue a little less senescent. Still using silver collagen. I talked to him about a total contact cast 2/17; 2-week follow-up. Left fourth metatarsal head. He is status post fifth ray  amputation on July 20. I thought we would be able to put a total contact cast on him however he arrives with a dramatic worsening of the wound with increased depth to bone as well as undermining from 6-3 o'clock. Scant amount of purulent drainage which I have cultured. This deterioration this week would obviously preclude a total contact cast. 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 10/13/2020 9:00 A M Medical Record Number: [id number] Patient Account Number: [id number] Date of Birth/Sex: Treating RN: [birth year] (60 y.o. [name] Primary Care Provider: Other Clinician: [name] Referring Provider: Treating Provider/Extender: [name] in Treatment: 4 Constitutional Patient is hypertensive.. Pulse regular and within target range for patient.[name] Respirations regular, non-labored and within target range.. Temperature is normal and within the target range for the patient.[name] Appears in no distress. Notes Wound exam; all the wounds were healed last time in the fourth metatarsal head looked healthy. Major deterioration this time with probing depth to bone, is undermining widely especially at 9:00. Scant amount of purulent drainage which I have cultured. The bone underneath this which is probably the fourth metatarsal head felt normal. Electronic Signature(s) Signed: 10/13/2020 5:29:55 PM By: Linton Ham MD Entered By: Linton Ham on 10/13/2020  09:58:51 -------------------------------------------------------------------------------- Physician Orders Details Patient Name: Date of Service: Robert Huang, Robert Huang Huang. 10/13/2020 9:00 A M Medical Record Number: [id number] Patient Account Number: [id number] Date of Birth/Sex: Treating RN: 1960/09/21 (60 y.o. [name], [name] Primary Care Provider: [name] Other Clinician: Referring Provider: Treating Provider/Extender: [name] in Treatment: 4 Verbal / Phone Orders: No Diagnosis Coding ICD-10 Coding Code Description E11.621 Type 2 diabetes mellitus with foot ulcer L97.522 Non-pressure chronic ulcer of other part of left foot with fat layer exposed L97.512 Non-pressure chronic ulcer of other part of right foot with fat layer exposed E11.40 Type 2 diabetes mellitus with diabetic neuropathy, unspecified I10 Essential (primary) hypertension Follow-up Appointments Return Appointment in 1 week. Bathing/ Shower/ Hygiene May shower and wash wound with soap and water. Off-Loading Wedge shoe to: - left foot to ambulate Non Wound Condition Protect area with: - pad with foam or gauze to right 2nd toe. Wound Treatment Wound #5 - Metatarsal head fourth Wound Laterality: Left Cleanser: Soap and Water Every Other Day/30 Days Discharge Instructions: May shower and wash wound with dial antibacterial soap and water prior to dressing change. Prim Dressing: KerraCel Ag Gelling Fiber Dressing, 2x2 in (silver alginate) (DME) (Generic) Every Other Day/30 Days ary Discharge Instructions: Apply and pack lightly silver alginate to wound bed and undermining as instructed Secondary Dressing: Woven Gauze Sponges 2x2 in (Generic) Every Other Day/30 Days Discharge  Instructions: Apply over primary dressing as directed. Secondary Dressing: Optifoam Non-Adhesive Dressing, 4x4 in (Generic) Every Other Day/30 Days Discharge Instructions: Apply over primary dressing cut to make foam  donut Secured With: Conforming Stretch Gauze Bandage, Sterile 2x75 (in/in) (Generic) Every Other Day/30 Days Discharge Instructions: Secure with stretch gauze as directed. Secured With: Paper Tape, 2x10 (in/yd) (Generic) Every Other Day/30 Days Discharge Instructions: Secure dressing with tape as directed. Laboratory naerobe culture (MICRO) - left foot wound. Bacteria identified in Unspecified specimen by A LOINC Code: 627-0 Convenience Name: Anerobic culture Radiology X-ray, foot left - x-ray of left foot related to non-healing diabetic foot ulcer. CPT code [phone]. - (ICD10 E11.621 - Type 2 diabetes mellitus with foot ulcer) Patient Medications llergies: No Known Drug Allergies A Notifications Medication Indication Start End wound infection left 10/13/2020 doxycycline monohydrate foot DOSE oral 100 mg capsule - 1 capsule oral bid for 7 days Electronic Signature(s) Signed: 10/13/2020 10:02:20 AM By: Linton Ham MD Entered By: Linton Ham on 10/13/2020 10:02:19 Prescription 10/13/2020 -------------------------------------------------------------------------------- Camillo Flaming MD Patient Name: Provider: [birth year] [id number] Date of Birth: NPI#Jerilynn Mages [id number] Sex: DEA #: [phone] Phone #: License #: [place] Patient Address: [address] [place], [place] [address] [place], [place] [address] [phone] Allergies No Known Drug Allergies Provider's Orders X-ray, foot left - ICD10: E11.621 - x-ray of left foot related to non-healing diabetic foot ulcer. CPT code [phone]. Hand Signature: Date(s): Electronic Signature(s) Signed: 10/13/2020 5:29:55 PM By: Linton Ham MD Entered By: Linton Ham on 10/13/2020 10:02:20 -------------------------------------------------------------------------------- Problem List Details Patient Name: Date of Service: Robert Huang, Robert Huang Huang. 10/13/2020 9:00 A M Medical Record Number: [id number] Patient Account Number: [id number] Date of Birth/Sex: Treating RN: [birth year] (60 y.o. [name], Tammi Klippel Primary Care Provider: [name] Other Clinician: Referring Provider: Treating Provider/Extender: [name] in Treatment: 4 Active Problems ICD-10 Encounter Code Description Active Date MDM Diagnosis E11.621 Type 2 diabetes mellitus with foot ulcer 09/14/2020 No Yes L97.522 Non-pressure chronic ulcer of other part of left foot with fat layer exposed 09/14/2020 No Yes L97.512 Non-pressure chronic ulcer of other part of right foot with fat layer exposed 09/14/2020 No Yes E11.40 Type 2 diabetes mellitus with diabetic neuropathy, unspecified 09/14/2020 No Yes I10 Essential (primary) hypertension 09/14/2020 No Yes Inactive Problems Resolved Problems Electronic Signature(s) Signed: 10/13/2020 5:29:55 PM By: Linton Ham MD Entered By: Linton Ham on 10/13/2020 09:55:59 -------------------------------------------------------------------------------- Progress Note Details Patient Name: Date of Service: Robert Huang, Robert Huang Huang. 10/13/2020 9:00 A M Medical Record Number: [id number] Patient Account Number: [id number] Date of Birth/Sex: Treating RN: [birth year] (60 y.o. [name] Primary Care Provider: [name] Other Clinician: Referring Provider: Treating Provider/Extender: [name] in Treatment: 4 Subjective History of Present Illness (HPI) ADMISSION 05/27/2020 This is a 60 year old man who has type 2 diabetes who is here for wounds on his left foot in 2 locations. The patient has had problems with foot ulcers requiring surgery including a right great toe amputation and a left fifth ray amputation in July 2020 for underlying osteomyelitis although strangely I cannot see any pathology or cultures done in this area. He states that the area on his left fourth  met head plantar aspect opened about a month ago. He has had continuous callus in this area. He saw Dr. Sarajane Jews his primary doctor at the beginning of September  and was felt to have a cellulitis in the lateral part of his foot in this area he was given a shot of Rocephin and then ongoing antibiotics with Keflex for 10 days. It would appear that he was supposed to follow-up with Dr. Sarajane Jews although I do not see these notes. About a week and a half ago he stubbed his left first toe and has a wound on this area. He is using Neosporin to both wound areas. 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 Sarajane Jews ABI in our clinic was 1.23 on the left 10/8; the patient's area over the left fourth metatarsal head is fully closed. This was a split callus last week that I debrided. He still has an open area over the tip of his left great toe. He is using a forefoot offloading and silver alginate. We made [facility] an appointment [facility] to see about custom made diabetic shoes with inserts this appointment is for next Tuesday. Readmission: 09/14/2020 upon evaluation today patient appears to have several wounds over his left dorsal great toe, left fourth metatarsal head, and right second toe. The good news is none of these appear to be severe at this point. Nonetheless unfortunately he is going require a little bit of the debridement today to try to clear away some of the necrotic debris currently. Specifically the left metatarsal head of the fourth metatarsal does look particularly is a wound that is good to require sharp debridement. The patient does have a history of diabetes mellitus type 2, hypertension, and diabetic  neuropathy. He has previously been seen here in the clinic and I am not certain that he is ever really have this completely healed although we have not seen him since October 2020. Fortunately his ABIs appear to be doing excellent today. 1/27; patient was readmitted to the clinic last week. He had an area over his right second toe which is healed also of the left dorsal great toe is also healed. He attributes these wounds to a hunting trip he took with boots that perhaps were not helpful on his feet. He still has the area on the fourth met head status post left fifth ray amputation in July 20 2/3; left fourth metatarsal head. Wound is about the same size as last week some debris around the circumference of the wound including callus and dry skin I removed this knocked down the circumference hopefully to make the surrounding tissue a little less senescent. Still using silver collagen. I talked to him about a total contact cast 2/17; 2-week follow-up. Left fourth metatarsal head. He is status post fifth ray amputation on July 20. I thought we would be able to put a total contact cast on him however he arrives with a dramatic worsening of the wound with increased depth to bone as well as undermining from 6-3 o'clock. Scant amount of purulent drainage which I have cultured. This deterioration this week would obviously preclude a total contact cast. 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 He has a forefoot offloading boot Objective Constitutional Patient  is hypertensive.. Pulse regular and within target range for patient.[name] Respirations regular, non-labored and within target range.. Temperature is normal and within the target range for the patient.[name] Appears in no distress. Vitals Time Taken: 9:11 AM, Height: 72 in, Source: Stated, Weight: 205 lbs, Source: Stated, BMI: 27.8, Temperature: 98.4  F, Pulse: 67 bpm, Respiratory Rate: 18 breaths/min, Blood Pressure: 148/76 mmHg, Capillary Blood Glucose: 131 mg/dl. General Notes: glucose per pt report this am General Notes: Wound exam; all the wounds were healed last time in the fourth metatarsal head looked healthy. Major deterioration this time with probing depth to bone, is undermining widely especially at 9:00. Scant amount of purulent drainage which I have cultured. The bone underneath this which is probably the fourth metatarsal head felt normal. Integumentary (Hair, Skin) Wound #5 status is Open. Original cause of wound was Shear/Friction. The wound is located on the Left Metatarsal head fourth. The wound measures 0.6cm length x 0.7cm width x 0.7cm depth; 0.33cm^2 area and 0.231cm^3 volume. There is Fat Layer (Subcutaneous Tissue) exposed. There is no tunneling noted, however, there is undermining starting at 12:00 and ending at 12:00 with a maximum distance of 0.5cm. There is a medium amount of serosanguineous drainage noted. The wound margin is well defined and not attached to the wound base. There is small (1-33%) red granulation within the wound bed. There is a large (67-100%) amount of necrotic tissue within the wound bed including Adherent Slough. Assessment Active Problems ICD-10 Type 2 diabetes mellitus with foot ulcer Non-pressure chronic ulcer of other part of left foot with fat layer exposed Non-pressure chronic ulcer of other part of right foot with fat layer exposed Type 2 diabetes mellitus with diabetic neuropathy, unspecified Essential (primary) hypertension Plan Follow-up Appointments: Return Appointment in 1 week. Bathing/ Shower/ Hygiene: May shower and wash wound with soap and water. Off-Loading: Wedge shoe to: - left foot to ambulate Non Wound Condition: Protect area with: - pad with foam or gauze to right 2nd toe. Laboratory ordered were: Anerobic culture - left foot wound. Radiology ordered  were: X-ray, foot left - x-ray of left foot related to non-healing diabetic foot ulcer. CPT code [phone]. The following medication(s) was prescribed: doxycycline monohydrate oral 100 mg capsule 1 capsule oral bid for 7 days for wound infection left foot starting 10/13/2020 WOUND #5: - Metatarsal head fourth Wound Laterality: Left Cleanser: Soap and Water Every Other Day/30 Days Discharge Instructions: May shower and wash wound with dial antibacterial soap and water prior to dressing change. Prim Dressing: KerraCel Ag Gelling Fiber Dressing, 2x2 in (silver alginate) (DME) (Generic) Every Other Day/30 Days ary Discharge Instructions: Apply and pack lightly silver alginate to wound bed and undermining as instructed Secondary Dressing: Woven Gauze Sponges 2x2 in (Generic) Every Other Day/30 Days Discharge Instructions: Apply over primary dressing as directed. Secondary Dressing: Optifoam Non-Adhesive Dressing, 4x4 in (Generic) Every Other Day/30 Days Discharge Instructions: Apply over primary dressing cut to make foam donut Secured With: Conforming Stretch Gauze Bandage, Sterile 2x75 (in/in) (Generic) Every Other Day/30 Days Discharge Instructions: Secure with stretch gauze as directed. Secured With: Paper Huang ape, 2x10 (in/yd) (Generic) Every Other Day/30 Days Discharge Instructions: Secure dressing with tape as directed. 1. Unfortunately a fairly dramatic deterioration. 2. Dressing changed from silver collagen to silver alginate 3. Culture of serosanguineous drainage for CandS there was a mild amount of this. 4. I am going to give him empiric doxycycline 100 twice daily for 7 days 5. I have told him he may  need an MRI. 6. They are concerned about infection obviously precludes a total contact cast at this point Electronic Signature(s) Signed: 10/13/2020 10:03:16 AM By: Linton Ham MD Entered By: Linton Ham on 10/13/2020  10:03:16 -------------------------------------------------------------------------------- SuperBill Details Patient Name: Date of Service: Robert Huang, Robert Huang Huang. 10/13/2020 Medical Record Number: [id number] Patient Account Number: [id number] Date of Birth/Sex: Treating RN: [birth year] (60 y.o. [name], [name] Primary Care Provider: [name] Other Clinician: Referring Provider: Treating Provider/Extender: [name] in Treatment: 4 Diagnosis Coding ICD-10 Codes Code Description [phone] Type 2 diabetes mellitus with foot ulcer L97.522 Non-pressure chronic ulcer of other part of left foot with fat layer exposed L97.512 Non-pressure chronic ulcer of other part of right foot with fat layer exposed E11.40 Type 2 diabetes mellitus with diabetic neuropathy, unspecified I10 Essential (primary) hypertension Facility Procedures CPT4 Code: [id number] Description: 20721 - WOUND CARE VISIT-LEV 3 EST PT Modifier: Quantity: 1 Physician Procedures Electronic Signature(s) Signed: 10/13/2020 5:29:55 PM By: Linton Ham MD Signed: 10/13/2020 5:51:10 PM By: Deon Pilling Entered By: Deon Pilling on 10/13/2020 12:13:40

## 2020-10-13 NOTE — Progress Notes (Signed)
ISSA, KOSMICKI T ([id number]) . Visit Report for 10/13/2020 Arrival Information Details Patient Name: Date of Service: Devin Going 10/13/2020 9:00 A M Medical Record Number: [id number] Patient Account Number: [id number] Date of Birth/Sex: Treating RN: 1961/07/23 (61 y.o. [name] Primary Care Yoshiaki Kreuser: [name] Other Clinician: Referring Anas Reister: Treating Kelvis Berger/Extender: Doran Stabler in Treatment: 4 Visit Information History Since Last Visit Added or deleted any medications: No Patient Arrived: Ambulatory Any new allergies or adverse reactions: No Arrival Time: 09:09 Had a fall or experienced change in No Accompanied By: self activities of daily living that may affect Transfer Assistance: None risk of falls: Patient Identification Verified: Yes Signs or symptoms of abuse/neglect since last visito No Secondary Verification Process Completed: Yes Hospitalized since last visit: No Patient Requires Transmission-Based Precautions: No Implantable device outside of the clinic excluding No Patient Has Alerts: Yes cellular tissue based products placed in the center Patient Alerts: R ABI: 0.96 since last visit: L ABI: 1.19 Has Dressing in Place as Prescribed: Yes Has Footwear/Offloading in Place as Prescribed: Yes Left: Wedge Shoe Pain Present Now: Yes Electronic Signature(s) Signed: 10/13/2020 6:31:13 PM By: [name] RN, BSN Entered By: [name] on 10/13/2020 09:11:22 -------------------------------------------------------------------------------- Clinic Level of Care Assessment Details Patient Name: Date of Service: [name], [name] 10/13/2020 9:00 A M Medical Record Number: [id number] Patient Account Number: [id number] Date of Birth/Sex: Treating RN: 1960/10/11 (60 y.o. Harlon Flor, Millard.Loa Primary Care Anyi Fels: [name] Other Clinician: Referring Neyland Pettengill: Treating Kamylle Axelson/Extender: Doran Stabler in Treatment: 4 Clinic Level of Care Assessment Items TOOL 4 Quantity Score X- 1 0 Use when only an EandM is performed on FOLLOW-UP visit ASSESSMENTS - Nursing Assessment / Reassessment X- 1 10 Reassessment of Co-morbidities (includes updates in patient status) X- 1 5 Reassessment of Adherence to Treatment Plan ASSESSMENTS - Wound and Skin A ssessment / Reassessment X - Simple Wound Assessment / Reassessment - one wound 1 5  - 0 Complex Wound Assessment / Reassessment - multiple wounds X- 1 10 Dermatologic / Skin Assessment (not related to wound area) ASSESSMENTS - Focused Assessment X- 1 5 Circumferential Edema Measurements - multi extremities X- 1 10 Nutritional Assessment / Counseling / Intervention  - 0 Lower Extremity Assessment (monofilament, tuning fork, pulses)  - 0 Peripheral Arterial Disease Assessment (using hand held doppler) ASSESSMENTS - Ostomy and/or Continence Assessment and Care  - 0 Incontinence Assessment and Management  - 0 Ostomy Care Assessment and Management (repouching, etc.) PROCESS - Coordination of Care X - Simple Patient / Family Education for ongoing care 1 15  - 0 Complex (extensive) Patient / Family Education for ongoing care X- 1 10 Staff obtains Chiropractor, Records, T Results / Process Orders est  - 0 Staff telephones HHA, Nursing Homes / Clarify orders / etc  - 0 Routine Transfer to another Facility (non-emergent condition)  - 0 Routine Hospital Admission (non-emergent condition)  - 0 New Admissions / Manufacturing engineer / Ordering NPWT Apligraf, etc. ,  - 0 Emergency Hospital Admission (emergent condition) X- 1 10 Simple Discharge Coordination  - 0 Complex (extensive) Discharge Coordination PROCESS - Special Needs  - 0 Pediatric / Minor Patient Management  - 0 Isolation Patient Management  - 0 Hearing / Language / Visual special needs  - 0 Assessment of  Community assistance (transportation, D/C planning, etc.)  - 0 Additional assistance / Altered mentation  - 0 Support Surface(s) Assessment (  bed, cushion, seat, etc.) INTERVENTIONS - Wound Cleansing / Measurement X - Simple Wound Cleansing - one wound 1 5 []  - 0 Complex Wound Cleansing - multiple wounds X- 1 5 Wound Imaging (photographs - any number of wounds) []  - 0 Wound Tracing (instead of photographs) X- 1 5 Simple Wound Measurement - one wound []  - 0 Complex Wound Measurement - multiple wounds INTERVENTIONS - Wound Dressings X - Small Wound Dressing one or multiple wounds 1 10 []  - 0 Medium Wound Dressing one or multiple wounds []  - 0 Large Wound Dressing one or multiple wounds []  - 0 Application of Medications - topical []  - 0 Application of Medications - injection INTERVENTIONS - Miscellaneous []  - 0 External ear exam []  - 0 Specimen Collection (cultures, biopsies, blood, body fluids, etc.) X- 1 5 Specimen(s) / Culture(s) sent or taken to Lab for analysis []  - 0 Patient Transfer (multiple staff / Nurse, adultHoyer Lift / Similar devices) []  - 0 Simple Staple / Suture removal (25 or less) []  - 0 Complex Staple / Suture removal (26 or more) []  - 0 Hypo / Hyperglycemic Management (close monitor of Blood Glucose) []  - 0 Ankle / Brachial Index (ABI) - do not check if billed separately X- 1 5 Vital Signs Has the patient been seen at the hospital within the last three years: Yes Total Score: 115 Level Of Care: New/Established - Level 3 Electronic Signature(s) Signed: 10/13/2020 5:51:10 PM By: Shawn Stalleaton, Bobbi Entered By: Shawn Stalleaton, Bobbi on 10/13/2020 12:13:34 -------------------------------------------------------------------------------- Encounter Discharge Information Details Patient Name: Date of Service: Kathlyn SacramentoGRINDSTA FF, Donnal DebarA LA N T. 10/13/2020 9:00 A M Medical Record Number: [id number] Patient Account Number: [id number] Date of Birth/Sex: Treating RN: Jun 04, 1961 (60 y.o. Damaris SchoonerM)  Boehlein, Linda Primary Care Osmany Azer: Nelwyn SalisburyFry, Stephen A Other Clinician: Referring Sadeel Fiddler: Treating Electa Sterry/Extender: Doran Stablerobson, Michael Fry, Stephen A Weeks in Treatment: 4 Encounter Discharge Information Items Discharge Condition: Stable Ambulatory Status: Ambulatory Discharge Destination: Home Transportation: Private Auto Accompanied By: self Schedule Follow-up Appointment: Yes Clinical Summary of Care: Patient Declined Electronic Signature(s) Signed: 10/13/2020 6:31:13 PM By: Zenaida DeedBoehlein, Linda RN, BSN Entered By: Zenaida DeedBoehlein, Linda on 10/13/2020 10:13:37 -------------------------------------------------------------------------------- Lower Extremity Assessment Details Patient Name: Date of Service: Kathlyn SacramentoGRINDSTA FF, Donnal DebarA LA N T. 10/13/2020 9:00 A M Medical Record Number: [id number] Patient Account Number: [id number] Date of Birth/Sex: Treating RN: Jun 04, 1961 (60 y.o. Damaris SchoonerM) Boehlein, Linda Primary Care Teller Wakefield: Nelwyn SalisburyFry, Stephen A Other Clinician: Referring Areal Cochrane: Treating Douglas Smolinsky/Extender: Doran Stablerobson, Michael Fry, Stephen A Weeks in Treatment: 4 Edema Assessment Assessed: Kyra Searles[Left: No] Franne Forts[Right: No] Edema: [Left: N] [Right: o] Calf Left: Right: Point of Measurement: 35 cm From Medial Instep 37.5 cm Ankle Left: Right: Point of Measurement: 11 cm From Medial Instep 22 cm Vascular Assessment Pulses: Dorsalis Pedis Palpable: [Left:Yes] Electronic Signature(s) Signed: 10/13/2020 6:31:13 PM By: Zenaida DeedBoehlein, Linda RN, BSN Entered By: Zenaida DeedBoehlein, Linda on 10/13/2020 09:20:29 -------------------------------------------------------------------------------- Multi Wound Chart Details Patient Name: Date of Service: Kathlyn SacramentoGRINDSTA FF, Donnal DebarA LA N T. 10/13/2020 9:00 A M Medical Record Number: [id number] Patient Account Number: [id number] Date of Birth/Sex: Treating RN: Jun 04, 1961 (60 y.o. Tammy SoursM) Deaton, Bobbi Primary Care Eowyn Tabone: Nelwyn SalisburyFry, Stephen A Other Clinician: Referring Meliton Samad: Treating Green Quincy/Extender: Doran Stablerobson,  Michael Fry, Stephen A Weeks in Treatment: 4 Vital Signs Height(in): 72 Capillary Blood Glucose(mg/dl): 086131 Weight(lbs): 578205 Pulse(bpm): 67 Body Mass Index(BMI): 28 Blood Pressure(mmHg): 148/76 Temperature(F): 98.4 Respiratory Rate(breaths/min): 18 Photos: [5:No Photos Left Metatarsal head fourth] [N/A:N/A N/A] Wound Location: [5:Shear/Friction] [N/A:N/A] Wounding Event: [5:Diabetic Wound/Ulcer of the Lower] [N/A:N/A] Primary Etiology: [5:Extremity Coronary Artery Disease,] [  N/A:N/A] Comorbid History: [5:Hypertension, Type II Diabetes, Gout 08/29/2020] [N/A:N/A] Date Acquired: [5:4] [N/A:N/A] Weeks of Treatment: [5:Open] [N/A:N/A] Wound Status: [5:0.6x0.7x0.7] [N/A:N/A] Measurements L x W x D (cm) [5:0.33] [N/A:N/A] A (cm) : rea [5:0.231] [N/A:N/A] Volume (cm) : [5:90.70%] [N/A:N/A] % Reduction in A rea: [5:67.30%] [N/A:N/A] % Reduction in Volume: [5:12] Starting Position 1 (o'clock): [5:12] Ending Position 1 (o'clock): [5:0.5] Maximum Distance 1 (cm): [5:Yes] [N/A:N/A] Undermining: [5:Grade 2] [N/A:N/A] Classification: [5:Medium] [N/A:N/A] Exudate A mount: [5:Serosanguineous] [N/A:N/A] Exudate Type: [5:red, brown] [N/A:N/A] Exudate Color: [5:Well defined, not attached] [N/A:N/A] Wound Margin: [5:Small (1-33%)] [N/A:N/A] Granulation A mount: [5:Red] [N/A:N/A] Granulation Quality: [5:Large (67-100%)] [N/A:N/A] Necrotic A mount: [5:Fat Layer (Subcutaneous Tissue): Yes N/A] Exposed Structures: [5:Fascia: No Tendon: No Muscle: No Joint: No Bone: No Small (1-33%)] [N/A:N/A] Treatment Notes Electronic Signature(s) Signed: 10/13/2020 5:29:55 PM By: Baltazar Najjar MD Signed: 10/13/2020 5:51:10 PM By: Shawn Stall Signed: 10/13/2020 5:51:10 PM By: Shawn Stall Entered By: Baltazar Najjar on 10/13/2020 09:56:05 -------------------------------------------------------------------------------- Multi-Disciplinary Care Plan Details Patient Name: Date of Service: [name], [name] 10/13/2020 9:00 A M Medical Record Number: [id number] Patient Account Number: [id number] Date of Birth/Sex: Treating RN: [birth year] (59 y.o. Harlon Flor, Millard.Loa Primary Care Khiree Bukhari: [name] Other Clinician: Referring Veneda Kirksey: Treating Caliann Leckrone/Extender: Doran Stabler in Treatment: 4 Active Inactive Abuse / Safety / Falls / Self Care Management Nursing Diagnoses: Potential for falls Goals: Patient/caregiver will verbalize/demonstrate measures taken to prevent injury and/or falls Date Initiated: 09/14/2020 Target Resolution Date: 10/28/2020 Goal Status: Active Interventions: Assess fall risk on admission and as needed Assess impairment of mobility on admission and as needed per policy Notes: Nutrition Nursing Diagnoses: Impaired glucose control: actual or potential Potential for alteratiion in Nutrition/Potential for imbalanced nutrition Goals: Patient/caregiver will maintain therapeutic glucose control Date Initiated: 09/14/2020 Target Resolution Date: 10/28/2020 Goal Status: Active Interventions: Assess patient nutrition upon admission and as needed per policy Provide education on elevated blood sugars and impact on wound healing Treatment Activities: Patient referred to Primary Care Physician for further nutritional evaluation : 09/14/2020 Notes: Wound/Skin Impairment Nursing Diagnoses: Impaired tissue integrity Knowledge deficit related to ulceration/compromised skin integrity Goals: Patient/caregiver will verbalize understanding of skin care regimen Date Initiated: 09/14/2020 Target Resolution Date: 10/28/2020 Goal Status: Active Ulcer/skin breakdown will have a volume reduction of 30% by week 4 Date Initiated: 09/14/2020 Target Resolution Date: 10/12/2020 Goal Status: Active Interventions: Assess patient/caregiver ability to obtain necessary supplies Assess patient/caregiver ability to perform ulcer/skin care regimen upon admission and  as needed Assess ulceration(s) every visit Treatment Activities: Topical wound management initiated : 09/14/2020 Notes: Electronic Signature(s) Signed: 10/13/2020 5:51:10 PM By: Shawn Stall Entered By: Shawn Stall on 10/13/2020 09:10:45 -------------------------------------------------------------------------------- Pain Assessment Details Patient Name: Date of Service: [name] 10/13/2020 9:00 A M Medical Record Number: [id number] Patient Account Number: [id number] Date of Birth/Sex: Treating RN: 08-30-1960 (60 y.o. [name] Primary Care Marci Polito: [name] Other Clinician: Referring Parish Dubose: Treating Tayona Sarnowski/Extender: Doran Stabler in Treatment: 4 Active Problems Location of Pain Severity and Description of Pain Patient Has Paino Yes Site Locations Pain Location: Pain in Ulcers With Dressing Change: Yes Duration of the Pain. Constant / Intermittento Intermittent Rate the pain. Current Pain Level: 0 Worst Pain Level: 6 Least Pain Level: 0 Character of Pain Describe the Pain: Tender Pain Management and Medication Current Pain Management: Rest: Yes Is the Current Pain Management Adequate: Adequate How does your wound impact your activities  of daily livingo Sleep: No Bathing: No Appetite: No Relationship With Others: No Bladder Continence: No Emotions: No Bowel Continence: No Work: No Toileting: No Drive: No Dressing: No Hobbies: No Notes reports tenderness with pressure Electronic Signature(s) Signed: 10/13/2020 6:31:13 PM By: [name] RN, BSN Entered By: [name] on 10/13/2020 09:19:40 -------------------------------------------------------------------------------- Patient/Caregiver Education Details Patient Name: Date of Service: [name], [name] 2/17/2022andnbsp9:00 A M Medical Record Number: [id number] Patient Account Number: [id number] Date of Birth/Gender: Treating  RN: 1961-05-09 (60 y.o. [name] Primary Care Physician: [name] Other Clinician: Referring Physician: Treating Physician/Extender: Doran Stabler in Treatment: 4 Education Assessment Education Provided To: Patient Education Topics Provided Elevated Blood Sugar/ Impact on Healing: Handouts: Elevated Blood Sugars: How Do They Affect Wound Healing Methods: Explain/Verbal Responses: Reinforcements needed Electronic Signature(s) Signed: 10/13/2020 5:51:10 PM By: Shawn Stall Entered By: Shawn Stall on 10/13/2020 09:10:55 -------------------------------------------------------------------------------- Wound Assessment Details Patient Name: Date of Service: [name] 10/13/2020 9:00 A M Medical Record Number: [id number] Patient Account Number: [id number] Date of Birth/Sex: Treating RN: 1961/02/05 (60 y.o. [name] Primary Care Honestii Marton: [name] Other Clinician: Referring [place]: Treating Tanairi Cypert/Extender: Doran Stabler in Treatment: 4 Wound Status Wound Number: 5 Primary Diabetic Wound/Ulcer of the Lower Extremity Etiology: Wound Location: Left Metatarsal head fourth Wound Status: Open Wounding Event: Shear/Friction Comorbid Coronary Artery Disease, Hypertension, Type II Diabetes, Date Acquired: 08/29/2020 History: Gout Weeks Of Treatment: 4 Clustered Wound: No Wound Measurements Length: (cm) 0.6 Width: (cm) 0.7 Depth: (cm) 0.7 Area: (cm) 0.33 Volume: (cm) 0.231 % Reduction in Area: 90.7% % Reduction in Volume: 67.3% Epithelialization: Small (1-33%) Tunneling: No Undermining: Yes Starting Position (o'clock): 12 Ending Position (o'clock): 12 Maximum Distance: (cm) 0.5 Wound Description Classification: Grade 2 Wound Margin: Well defined, not attached Exudate Amount: Medium Exudate Type: Serosanguineous Exudate Color: red, brown Foul Odor After Cleansing:  No Slough/Fibrino No Wound Bed Granulation Amount: Small (1-33%) Exposed Structure Granulation Quality: Red Fascia Exposed: No Necrotic Amount: Large (67-100%) Fat Layer (Subcutaneous Tissue) Exposed: Yes Necrotic Quality: Adherent Slough Tendon Exposed: No Muscle Exposed: No Joint Exposed: No Bone Exposed: No Treatment Notes Wound #5 (Metatarsal head fourth) Wound Laterality: Left Cleanser Soap and Water Discharge Instruction: May shower and wash wound with dial antibacterial soap and water prior to dressing change. Peri-Wound Care Topical Primary Dressing KerraCel Ag Gelling Fiber Dressing, 2x2 in (silver alginate) Discharge Instruction: Apply and pack lightly silver alginate to wound bed and undermining as instructed Secondary Dressing Woven Gauze Sponges 2x2 in Discharge Instruction: Apply over primary dressing as directed. Optifoam Non-Adhesive Dressing, 4x4 in Discharge Instruction: Apply over primary dressing cut to make foam donut Secured With Conforming Stretch Gauze Bandage, Sterile 2x75 (in/in) Discharge Instruction: Secure with stretch gauze as directed. Paper Tape, 2x10 (in/yd) Discharge Instruction: Secure dressing with tape as directed. Compression Wrap Compression Stockings Add-Ons Electronic Signature(s) Signed: 10/13/2020 6:31:13 PM By: [name] RN, BSN Entered By: [name] on 10/13/2020 09:22:06 -------------------------------------------------------------------------------- Vitals Details Patient Name: Date of Service: [name], [name] 10/13/2020 9:00 A M Medical Record Number: [id number] Patient Account Number: [id number] Date of Birth/Sex: Treating RN: May 28, 1961 (60 y.o. [name] Primary Care Jun Osment: [name] Other Clinician: Referring Ziggy Chanthavong: Treating Payne Garske/Extender: Doran Stabler in Treatment: 4 Vital Signs Time Taken: 09:11 Temperature (F): 98.4 Height (in): 72 Pulse  (bpm): 67 Source: Stated Respiratory Rate (breaths/min): 18  Weight (lbs): 205 Blood Pressure (mmHg): 148/76 Source: Stated Capillary Blood Glucose (mg/dl): 828 Body Mass Index (BMI): 27.8 Reference Range: 80 - 120 mg / dl Notes glucose per pt report this am Electronic Signature(s) Signed: 10/13/2020 6:31:13 PM By: [name] RN, BSN Entered By: [name] on 10/13/2020 09:18:35

## 2020-10-14 DIAGNOSIS — L97512 Non-pressure chronic ulcer of other part of right foot with fat layer exposed: Secondary | ICD-10-CM | POA: Diagnosis not present

## 2020-10-14 DIAGNOSIS — L97522 Non-pressure chronic ulcer of other part of left foot with fat layer exposed: Secondary | ICD-10-CM | POA: Diagnosis not present

## 2020-10-14 DIAGNOSIS — I10 Essential (primary) hypertension: Secondary | ICD-10-CM | POA: Diagnosis not present

## 2020-10-14 DIAGNOSIS — E11621 Type 2 diabetes mellitus with foot ulcer: Secondary | ICD-10-CM | POA: Diagnosis not present

## 2020-10-14 DIAGNOSIS — E1140 Type 2 diabetes mellitus with diabetic neuropathy, unspecified: Secondary | ICD-10-CM | POA: Diagnosis not present

## 2020-10-17 LAB — AEROBIC CULTURE W GRAM STAIN (SUPERFICIAL SPECIMEN): Gram Stain: NONE SEEN

## 2020-10-18 ENCOUNTER — Encounter: Payer: Self-pay | Admitting: Family Medicine

## 2020-10-18 NOTE — Telephone Encounter (Signed)
Yes I would be happy to see her  

## 2020-10-19 ENCOUNTER — Other Ambulatory Visit: Payer: Self-pay | Admitting: Family Medicine

## 2020-10-20 ENCOUNTER — Other Ambulatory Visit (HOSPITAL_COMMUNITY): Payer: Self-pay | Admitting: Internal Medicine

## 2020-10-20 ENCOUNTER — Other Ambulatory Visit: Payer: Self-pay | Admitting: Internal Medicine

## 2020-10-20 ENCOUNTER — Other Ambulatory Visit: Payer: Self-pay

## 2020-10-20 ENCOUNTER — Encounter (HOSPITAL_BASED_OUTPATIENT_CLINIC_OR_DEPARTMENT_OTHER): Payer: 59 | Admitting: Internal Medicine

## 2020-10-20 DIAGNOSIS — E1151 Type 2 diabetes mellitus with diabetic peripheral angiopathy without gangrene: Secondary | ICD-10-CM | POA: Diagnosis not present

## 2020-10-20 DIAGNOSIS — L97522 Non-pressure chronic ulcer of other part of left foot with fat layer exposed: Secondary | ICD-10-CM | POA: Diagnosis not present

## 2020-10-20 DIAGNOSIS — E11621 Type 2 diabetes mellitus with foot ulcer: Secondary | ICD-10-CM

## 2020-10-20 DIAGNOSIS — Z955 Presence of coronary angioplasty implant and graft: Secondary | ICD-10-CM | POA: Diagnosis not present

## 2020-10-20 DIAGNOSIS — L97512 Non-pressure chronic ulcer of other part of right foot with fat layer exposed: Secondary | ICD-10-CM | POA: Diagnosis not present

## 2020-10-20 DIAGNOSIS — E1140 Type 2 diabetes mellitus with diabetic neuropathy, unspecified: Secondary | ICD-10-CM | POA: Diagnosis not present

## 2020-10-20 DIAGNOSIS — L03116 Cellulitis of left lower limb: Secondary | ICD-10-CM | POA: Diagnosis not present

## 2020-10-20 DIAGNOSIS — Z888 Allergy status to other drugs, medicaments and biological substances status: Secondary | ICD-10-CM | POA: Diagnosis not present

## 2020-10-20 DIAGNOSIS — L97509 Non-pressure chronic ulcer of other part of unspecified foot with unspecified severity: Secondary | ICD-10-CM

## 2020-10-20 NOTE — Progress Notes (Signed)
Robert Huang, BARGE T ([id number]) . Visit Report for 10/20/2020 HPI Details Patient Name: Date of Service: Wiliam Huang 10/20/2020 9:00 A M Medical Record Number: [id number] Patient Account Number: [id number] Date of Birth/Sex: Treating RN: 08-25-1961 (60 y.o. [name] Primary Care Provider: [name] Other Clinician: Referring Provider: Treating Provider/Extender: [name] in Treatment: 5 History of Present Illness HPI Description: ADMISSION 05/27/2020 This is a 60 year old man who has type 2 diabetes who is here for wounds on his left foot in 2 locations. The patient has had problems with foot ulcers requiring surgery including a right great toe amputation and a left fifth ray amputation in July 2020 for underlying osteomyelitis although strangely I cannot see any pathology or cultures done in this area. He states that the area on his left fourth met head plantar aspect opened about a month ago. He has had continuous callus in this area. He saw Dr. Sarajane Jews his primary doctor at the beginning of September and was felt to have a cellulitis in the lateral part of his foot in this area he was given a shot of Rocephin and then ongoing antibiotics with Keflex for 10 days. It would appear that he was supposed to follow-up with Dr. Sarajane Jews although I do not see these notes. About a week and a half ago he stubbed his left first toe and has a wound on this area. He is using Neosporin to both wound areas. 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 Sarajane Jews ABI in our clinic was 1.23 on the left 10/8; the patient's area over the left fourth metatarsal head is fully closed. This  was a split callus last week that I debrided. He still has an open area over the tip of his left great toe. He is using a forefoot offloading and silver alginate. We [facility] an appointment with [facility] to see about custom made diabetic shoes with inserts this appointment is for next Tuesday. Readmission: 09/14/2020 upon evaluation today patient appears to have several wounds over his left dorsal great toe, left fourth metatarsal head, and right second toe. The good news is none of these appear to be severe at this point. Nonetheless unfortunately he is going require a little bit of the debridement today to try to clear away some of the necrotic debris currently. Specifically the left metatarsal head of the fourth metatarsal does look particularly is a wound that is good to require sharp debridement. The patient does have a history of diabetes mellitus type 2, hypertension, and diabetic neuropathy. He has previously been seen here in the clinic and I am not certain that he is ever really have this completely healed although we have not seen him since October 2020. Fortunately his ABIs appear to be doing excellent today. 1/27; patient was readmitted to the clinic last week. He had an area over his right second toe which is healed also of the left dorsal great toe is also healed. He attributes these wounds to a hunting trip he took with boots that perhaps were not helpful on his feet. He still has the area on the fourth met head status post left fifth  ray amputation in July 20 2/3; left fourth metatarsal head. Wound is about the same size as last week some debris around the circumference of the wound including callus and dry skin I removed this knocked down the circumference hopefully to make the surrounding tissue a little less senescent. Still using silver collagen. I talked to him about a total contact cast 2/17; 2-week follow-up. Left fourth metatarsal head. He is status post fifth ray  amputation in July 2020. I thought we would be able to put a total contact cast on him however he arrives with a dramatic worsening of the wound with increased depth to bone as well as undermining from 6-3 o'clock. Scant amount of purulent drainage which I have cultured. This deterioration this week would obviously preclude a total contact cast. 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 He has a forefoot offloading boot 2/24; culture of this wound that I did last time after registering a fairly dramatic deterioration with exposed bone showed MRSA and Enterococcus faecalis. After some difficulty with insurance and cost of linezolid he is taking that now. T olerating it well. He will definitely need an MRI of the foot and I have ordered this. Using silver alginate on the wound Electronic Signature(s) Signed: 10/20/2020 5:09:37 PM By: Linton Ham MD Entered By: Linton Ham on 10/20/2020 10:11:14 -------------------------------------------------------------------------------- Physical Exam Details Patient Name: Date of Service: Robert Huang, Robert Huang T. 10/20/2020 9:00 A M Medical Record Number: [id number] Patient Account Number: [id number] Date of Birth/Sex: Treating RN: 05/01/1961 (60 y.o. [name] Primary Care Provider: [name] Other Clinician: Referring Provider: Treating Provider/Extender: [name] in Treatment: 5 Constitutional Sitting or standing Blood Pressure is within target range for patient.. Pulse regular and within target range for patient.[name] Respirations regular, non-labored and within target range.. Temperature is normal and within the target range for the patient.[name] Appears in no distress. Cardiovascular Pedal pulses are palpable. Integumentary (Hair, Skin) No surrounding erythema. Notes Wound exam; the patient's remaining  wound is on the left fourth metatarsal head. Surface of the tissue looks better however there is still probing bone. The bone itself feels satisfactory however this was not present when he first came here. There is no surrounding erythema no gross purulence Electronic Signature(s) Signed: 10/20/2020 5:09:37 PM By: Linton Ham MD Entered By: Linton Ham on 10/20/2020 10:12:25 -------------------------------------------------------------------------------- Physician Orders Details Patient Name: Date of Service: Robert Huang, Robert Huang T. 10/20/2020 9:00 A M Medical Record Number: [id number] Patient Account Number: [id number] Date of Birth/Sex: Treating RN: 19-Apr-1961 (60 y.o. [name] Primary Care Provider: [name] Other Clinician: Referring Provider: Treating Provider/Extender: [name] in Treatment: 5 Verbal / Phone Orders: No Diagnosis Coding Follow-up Appointments Return Appointment in 1 week. Bathing/ Shower/ Hygiene May shower and wash wound with soap and water. Off-Loading Wedge shoe to: - left foot to ambulate Additional Orders / Instructions Other: - No alginate with silver to wound bed the day of MRI. Apply only outer bandage for MRI. No silver dressings with MRI. Non Wound Condition Protect area with: - pad with foam or gauze to right 2nd toe. Wound Treatment Wound #5 - Metatarsal head fourth Wound Laterality: Left Cleanser: Soap and Water Every Other Day/30 Days Discharge Instructions: May shower and wash wound with dial antibacterial soap and  water prior to dressing change. Prim Dressing: KerraCel Ag Gelling Fiber Dressing, 2x2 in (silver alginate) (Generic) Every Other Day/30 Days ary Discharge Instructions: Apply and pack lightly silver alginate to wound bed and undermining as instructed Secondary Dressing: Woven Gauze Sponges 2x2 in (Generic) Every Other Day/30 Days Discharge Instructions: Apply over primary dressing as  directed. Secondary Dressing: Optifoam Non-Adhesive Dressing, 4x4 in (Generic) Every Other Day/30 Days Discharge Instructions: Apply over primary dressing cut to make foam donut Secured With: Conforming Stretch Gauze Bandage, Sterile 2x75 (in/in) (Generic) Every Other Day/30 Days Discharge Instructions: Secure with stretch gauze as directed. Secured With: Paper Tape, 2x10 (in/yd) (Generic) Every Other Day/30 Days Discharge Instructions: Secure dressing with tape as directed. Radiology MRI, lower extremity with and without contrast left foot - MRI with and without contrast to left foot related to non-healing diabetic foot ulcer looking for infection. ICD 10 E11.621 CPT code Electronic Signature(s) Signed: 10/20/2020 5:09:37 PM By: Linton Ham MD Signed: 10/20/2020 5:39:27 PM By: Deon Pilling Entered By: Deon Pilling on 10/20/2020 09:51:09 Prescription 10/20/2020 -------------------------------------------------------------------------------- Camillo Flaming MD Patient Name: Provider: February 03, 1961 [id number] Date of Birth: NPI#: Jerilynn Mages [id number] Sex: DEA #: [phone] Phone #: License #: [place] Patient Address: [address] [place], [place] [address] [address], [place] [address] [phone] Allergies No Known Drug Allergies Provider's Orders MRI, lower extremity with and without contrast left foot - MRI with and without contrast to left foot related to non-healing diabetic foot ulcer looking for infection. ICD 10 E11.621 CPT code Hand Signature: Date(s): Electronic Signature(s) Signed: 10/20/2020 5:09:37 PM By: Linton Ham MD Signed: 10/20/2020 5:39:27 PM By: Deon Pilling Entered By: Deon Pilling on 10/20/2020 09:51:10 -------------------------------------------------------------------------------- Problem List Details Patient Name: Date of Service: Robert Huang, Robert Huang T.  10/20/2020 9:00 A M Medical Record Number: [id number] Patient Account Number: [id number] Date of Birth/Sex: Treating RN: 1961-04-20 (60 y.o. [name], Tammi Klippel Primary Care Provider: [name] Other Clinician: Referring Provider: Treating Provider/Extender: [name] in Treatment: 5 Active Problems ICD-10 Encounter Code Description Active Date MDM Diagnosis E11.621 Type 2 diabetes mellitus with foot ulcer 09/14/2020 No Yes L97.522 Non-pressure chronic ulcer of other part of left foot with fat layer exposed 09/14/2020 No Yes L97.512 Non-pressure chronic ulcer of other part of right foot with fat layer exposed 09/14/2020 No Yes E11.40 Type 2 diabetes mellitus with diabetic neuropathy, unspecified 09/14/2020 No Yes I10 Essential (primary) hypertension 09/14/2020 No Yes L03.116 Cellulitis of left lower limb 10/20/2020 No Yes Inactive Problems Resolved Problems Electronic Signature(s) Signed: 10/20/2020 5:09:37 PM By: Linton Ham MD Entered By: Linton Ham on 10/20/2020 10:09:51 -------------------------------------------------------------------------------- Progress Note Details Patient Name: Date of Service: Robert Huang, Robert Huang T. 10/20/2020 9:00 A M Medical Record Number: [id number] Patient Account Number: [id number] Date of Birth/Sex: Treating RN: 05-23-1961 (60 y.o. [name] Primary Care Provider: [name] Other Clinician: Referring Provider: Treating Provider/Extender: [name] in Treatment: 5 Subjective History of Present Illness (HPI) ADMISSION 05/27/2020 This is a 60 year old man who has type 2 diabetes who is here for wounds on his left foot in 2 locations. The patient has had problems with foot ulcers requiring surgery including a right great toe amputation and a left fifth ray amputation in July 2020 for underlying osteomyelitis although strangely I cannot see any pathology or cultures done in this  area. He states that the  area on his left fourth met head plantar aspect opened about a month ago. He has had continuous callus in this area. He saw Dr. Sarajane Jews his primary doctor at the beginning of September and was felt to have a cellulitis in the lateral part of his foot in this area he was given a shot of Rocephin and then ongoing antibiotics with Keflex for 10 days. It would appear that he was supposed to follow-up with Dr. Sarajane Jews although I do not see these notes. About a week and a half ago he stubbed his left first toe and has a wound on this area. He is using Neosporin to both wound areas. 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 Sarajane Jews ABI in our clinic was 1.23 on the left 10/8; the patient's area over the left fourth metatarsal head is fully closed. This was a split callus last week that I debrided. He still has an open area over the tip of his left great toe. He is using a forefoot offloading and silver alginate. We made [facility] an appointment with [facility] to see about custom made diabetic shoes with inserts this appointment is for next Tuesday. Readmission: 09/14/2020 upon evaluation today patient appears to have several wounds over his left dorsal great toe, left fourth metatarsal head, and right second toe. The good news is none of these appear to be severe at this point. Nonetheless unfortunately he is going require a little bit of the debridement today to try to clear away some of the necrotic debris currently. Specifically the left metatarsal head of the fourth metatarsal does look particularly is a wound that is good to require sharp debridement. The patient does have a history of  diabetes mellitus type 2, hypertension, and diabetic neuropathy. He has previously been seen here in the clinic and I am not certain that he is ever really have this completely healed although we have not seen him since October 2020. Fortunately his ABIs appear to be doing excellent today. 1/27; patient was readmitted to the clinic last week. He had an area over his right second toe which is healed also of the left dorsal great toe is also healed. He attributes these wounds to a hunting trip he took with boots that perhaps were not helpful on his feet. He still has the area on the fourth met head status post left fifth ray amputation in July 20 2/3; left fourth metatarsal head. Wound is about the same size as last week some debris around the circumference of the wound including callus and dry skin I removed this knocked down the circumference hopefully to make the surrounding tissue a little less senescent. Still using silver collagen. I talked to him about a total contact cast 2/17; 2-week follow-up. Left fourth metatarsal head. He is status post fifth ray amputation in July 2020. I thought we would be able to put a total contact cast on him however he arrives with a dramatic worsening of the wound with increased depth to bone as well as undermining from 6-3 o'clock. Scant amount of purulent drainage which I have cultured. This deterioration this week would obviously preclude a total contact cast. 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 He has a forefoot offloading boot 2/24; culture of this wound that I did last time after registering a fairly dramatic deterioration with exposed bone showed MRSA and Enterococcus faecalis. After some difficulty with insurance and cost of linezolid he is taking that now. T olerating it well. He will definitely need an MRI of the foot and I have  ordered this. Using silver alginate on the wound Objective Constitutional Sitting or standing Blood Pressure is within target range for patient.. Pulse regular and within target range for patient.[name] Respirations regular, non-labored and within target range.. Temperature is normal and within the target range for the patient.[name] Appears in no distress. Vitals Time Taken: 9:07 AM, Height: 72 in, Weight: 205 lbs, BMI: 27.8, Temperature: 98.5 F, Pulse: 73 bpm, Respiratory Rate: 16 breaths/min, Blood Pressure: 145/66 mmHg, Capillary Blood Glucose: 163 mg/dl. General Notes: glucose per pt report Cardiovascular Pedal pulses are palpable. General Notes: Wound exam; the patient's remaining wound is on the left fourth metatarsal head. Surface of the tissue looks better however there is still probing bone. The bone itself feels satisfactory however this was not present when he first came here. There is no surrounding erythema no gross purulence Integumentary (Hair, Skin) No surrounding erythema. Wound #5 status is Open. Original cause of wound was Shear/Friction. The date acquired was: 08/29/2020. The wound has been in treatment 5 weeks. The wound is located on the Left Metatarsal head fourth. The wound measures 0.8cm length x 1.6cm width x 0.9cm depth; 1.005cm^2 area and 0.905cm^3 volume. There is Fat Layer (Subcutaneous Tissue) exposed. There is no tunneling noted, however, there is undermining starting at 12:00 and ending at 12:00 with a maximum distance of 0.7cm. There is a medium amount of serosanguineous drainage noted. The wound margin is well defined and not attached to the wound base. There is medium (34-66%) red granulation within the wound bed. There is a medium (34-66%) amount of necrotic tissue within the wound bed including Adherent Slough. Assessment Active Problems ICD-10 Type 2 diabetes mellitus with foot ulcer Non-pressure chronic ulcer of other part of left foot with fat layer  exposed Non-pressure chronic ulcer of other part of right foot with fat layer exposed Type 2 diabetes mellitus with diabetic neuropathy, unspecified Essential (primary) hypertension Cellulitis of left lower limb Plan Follow-up Appointments: Return Appointment in 1 week. Bathing/ Shower/ Hygiene: May shower and wash wound with soap and water. Off-Loading: Wedge shoe to: - left foot to ambulate Additional Orders / Instructions: Other: - No alginate with silver to wound bed the day of MRI. Apply only outer bandage for MRI. No silver dressings with MRI. Non Wound Condition: Protect area with: - pad with foam or gauze to right 2nd toe. Radiology ordered were: MRI, lower extremity with and without contrast left foot - MRI with and without contrast to left foot related to non-healing diabetic foot ulcer looking for infection. ICD 10 E11.621 CPT code WOUND #5: - Metatarsal head fourth Wound Laterality: Left Cleanser: Soap and Water Every Other Day/30 Days Discharge Instructions: May shower and wash wound with dial antibacterial soap and water prior to dressing change. Prim Dressing: KerraCel Ag Gelling Fiber Dressing, 2x2 in (silver alginate) (Generic) Every Other Day/30 Days ary Discharge Instructions: Apply and pack lightly silver alginate to wound bed and undermining as instructed Secondary Dressing: Woven Gauze Sponges 2x2 in (Generic) Every  Other Day/30 Days Discharge Instructions: Apply over primary dressing as directed. Secondary Dressing: Optifoam Non-Adhesive Dressing, 4x4 in (Generic) Every Other Day/30 Days Discharge Instructions: Apply over primary dressing cut to make foam donut Secured With: Conforming Stretch Gauze Bandage, Sterile 2x75 (in/in) (Generic) Every Other Day/30 Days Discharge Instructions: Secure with stretch gauze as directed. Secured With: Paper T ape, 2x10 (in/yd) (Generic) Every Other Day/30 Days Discharge Instructions: Secure dressing with tape as directed. 1.  Still using silver alginate 2. I have ordered an MRI, we told the patient he have to take the silver alginate out before he gets in the MRI. 3. With some difficulty we managed to get line escalated through [facility] with a good Rx coupon 4. I have told him and discussed with him the issues. He already had 5th ray amputation in 2020 so he is aware of some of this. I believe he is also had an amputation of the right first toe. The issue would be another amputation likely the ray amputation versus medical therapy to get this to close which might include both antibiotics for a prolonged period and hyperbarics. If his MRI is negative, could consider an advanced treatment product in a total contact cast Electronic Signature(s) Signed: 10/20/2020 5:09:37 PM By: Linton Ham MD Entered By: Linton Ham on 10/20/2020 10:14:07 -------------------------------------------------------------------------------- SuperBill Details Patient Name: Date of Service: Robert Huang, Keane Scrape 10/20/2020 Medical Record Number: [id number] Patient Account Number: [id number] Date of Birth/Sex: Treating RN: 1961/07/11 (60 y.o. [name], [name] Primary Care Provider: [name] Other Clinician: Referring Provider: Treating Provider/Extender: [name] in Treatment: 5 Diagnosis Coding ICD-10 Codes Code Description [phone] Type 2 diabetes mellitus with foot ulcer L97.522 Non-pressure chronic ulcer of other part of left foot with fat layer exposed L97.512 Non-pressure chronic ulcer of other part of right foot with fat layer exposed E11.40 Type 2 diabetes mellitus with diabetic neuropathy, unspecified I10 Essential (primary) hypertension L03.116 Cellulitis of left lower limb Facility Procedures CPT4 Code: [id number] Description: 99213 - WOUND CARE VISIT-LEV 3 EST PT Modifier: Quantity: 1 Physician Procedures Electronic Signature(s) Signed: 10/20/2020 5:09:37 PM By:  Linton Ham MD Signed: 10/20/2020 5:39:27 PM By: Deon Pilling Entered By: Deon Pilling on 10/20/2020 12:20:01

## 2020-10-22 ENCOUNTER — Other Ambulatory Visit: Payer: Self-pay

## 2020-10-22 ENCOUNTER — Ambulatory Visit (HOSPITAL_COMMUNITY)
Admission: RE | Admit: 2020-10-22 | Discharge: 2020-10-22 | Disposition: A | Discharge status: Home / Self Care | Payer: 59 | Source: Ambulatory Visit | Attending: Internal Medicine | Admitting: Internal Medicine

## 2020-10-22 DIAGNOSIS — E1169 Type 2 diabetes mellitus with other specified complication: Secondary | ICD-10-CM | POA: Diagnosis not present

## 2020-10-22 DIAGNOSIS — E11621 Type 2 diabetes mellitus with foot ulcer: Secondary | ICD-10-CM | POA: Diagnosis not present

## 2020-10-22 DIAGNOSIS — M869 Osteomyelitis, unspecified: Secondary | ICD-10-CM | POA: Diagnosis not present

## 2020-10-22 DIAGNOSIS — M25475 Effusion, left foot: Secondary | ICD-10-CM | POA: Diagnosis not present

## 2020-10-22 DIAGNOSIS — L97509 Non-pressure chronic ulcer of other part of unspecified foot with unspecified severity: Secondary | ICD-10-CM | POA: Insufficient documentation

## 2020-10-22 DIAGNOSIS — Z89422 Acquired absence of other left toe(s): Secondary | ICD-10-CM | POA: Diagnosis not present

## 2020-10-22 MED ORDER — GADOBUTROL 1 MMOL/ML IV SOLN
9.0000 mL | Freq: Once | INTRAVENOUS | Status: AC | PRN
  Administered 2020-10-22: 9 mL via INTRAVENOUS

## 2020-10-24 NOTE — Progress Notes (Signed)
JANTZ, MAIN T ([id number]) . Visit Report for 10/20/2020 Arrival Information Details Patient Name: Date of Service: Robert Huang 10/20/2020 9:00 Robert M Medical Record Number: [id number] Patient Account Number: [id number] Date of Birth/Sex: Treating RN: July 27, 1961 (60 y.o. Robert Huang Primary Care Desira Alessandrini: [name] Other Clinician: Referring Pearce Littlefield: Treating Dean Goldner/Extender: Doran Stabler in Treatment: 5 Visit Information History Since Last Visit Added or deleted any medications: No Patient Arrived: Ambulatory Any new allergies or adverse reactions: No Arrival Time: 09:05 Had Robert fall or experienced change in No Accompanied By: alone activities of daily living that may affect Transfer Assistance: None risk of falls: Patient Identification Verified: Yes Signs or symptoms of abuse/neglect since last visito No Secondary Verification Process Completed: Yes Hospitalized since last visit: No Patient Requires Transmission-Based Precautions: No Implantable device outside of the clinic excluding No Patient Has Alerts: Yes cellular tissue based products placed in the center Patient Alerts: R ABI: 0.96 since last visit: L ABI: 1.19 Has Dressing in Place as Prescribed: Yes Has Footwear/Offloading in Place as Prescribed: Yes Left: Wedge Shoe Pain Present Now: Yes Electronic Signature(s) Signed: 10/24/2020 5:56:44 PM By: Zandra Abts RN, BSN Entered By: Zandra Abts on 10/20/2020 09:06:35 -------------------------------------------------------------------------------- Clinic Level of Care Assessment Details Patient Name: Date of Service: Robert Huang, Robert Debar T. 10/20/2020 9:00 Robert M Medical Record Number: [id number] Patient Account Number: [id number] Date of Birth/Sex: Treating RN: Oct 28, 1960 (60 y.o. Robert Huang, Robert Huang Primary Care Boris Engelmann: [name] Other Clinician: Referring Jayziah Bankhead: Treating Saeed Toren/Extender: Doran Stabler in Treatment: 5 Clinic Level of Care Assessment Items TOOL 4 Quantity Score X- 1 0 Use when only an EandM is performed on FOLLOW-UP visit ASSESSMENTS - Nursing Assessment / Reassessment X- 1 10 Reassessment of Co-morbidities (includes updates in patient status) X- 1 5 Reassessment of Adherence to Treatment Plan ASSESSMENTS - Wound and Skin Robert ssessment / Reassessment X - Simple Wound Assessment / Reassessment - one wound 1 5 []  - 0 Complex Wound Assessment / Reassessment - multiple wounds X- 1 10 Dermatologic / Skin Assessment (not related to wound area) ASSESSMENTS - Focused Assessment X- 1 5 Circumferential Edema Measurements - multi extremities X- 1 10 Nutritional Assessment / Counseling / Intervention []  - 0 Lower Extremity Assessment (monofilament, tuning fork, pulses) []  - 0 Peripheral Arterial Disease Assessment (using hand held doppler) ASSESSMENTS - Ostomy and/or Continence Assessment and Care []  - 0 Incontinence Assessment and Management []  - 0 Ostomy Care Assessment and Management (repouching, etc.) PROCESS - Coordination of Care X - Simple Patient / Family Education for ongoing care 1 15 []  - 0 Complex (extensive) Patient / Family Education for ongoing care X- 1 10 Staff obtains , Records, T Results / Process Orders est []  - 0 Staff telephones HHA, Nursing Homes / Clarify orders / etc []  - 0 Routine Transfer to another Facility (non-emergent condition) []  - 0 Routine Hospital Admission (non-emergent condition) []  - 0 New Admissions / / Ordering NPWT Apligraf, etc. , []  - 0 Emergency Hospital Admission (emergent condition) X- 1 10 Simple Discharge Coordination []  - 0 Complex (extensive) Discharge Coordination PROCESS - Special Needs []  - 0 Pediatric / Minor Patient Management []  - 0 Isolation Patient Management []  - 0 Hearing / Language / Visual special needs []  - 0 Assessment of  Community assistance (transportation, D/C planning, etc.) []  - 0 Additional assistance / Altered mentation []  - 0 Support Surface(s) Assessment (  bed, cushion, seat, etc.) INTERVENTIONS - Wound Cleansing / Measurement X - Simple Wound Cleansing - one wound 1 5 []  - 0 Complex Wound Cleansing - multiple wounds X- 1 5 Wound Imaging (photographs - any number of wounds) []  - 0 Wound Tracing (instead of photographs) X- 1 5 Simple Wound Measurement - one wound []  - 0 Complex Wound Measurement - multiple wounds INTERVENTIONS - Wound Dressings X - Small Wound Dressing one or multiple wounds 1 10 []  - 0 Medium Wound Dressing one or multiple wounds []  - 0 Large Wound Dressing one or multiple wounds []  - 0 Application of Medications - topical []  - 0 Application of Medications - injection INTERVENTIONS - Miscellaneous []  - 0 External ear exam []  - 0 Specimen Collection (cultures, biopsies, blood, body fluids, etc.) []  - 0 Specimen(s) / Culture(s) sent or taken to Lab for analysis []  - 0 Patient Transfer (multiple staff / / Similar devices) []  - 0 Simple Staple / Suture removal (25 or less) []  - 0 Complex Staple / Suture removal (26 or more) []  - 0 Hypo / Hyperglycemic Management (close monitor of Blood Glucose) []  - 0 Ankle / Brachial Index (ABI) - do not check if billed separately X- 1 5 Vital Signs Has the patient been seen at the hospital within the last three years: Yes Total Score: 110 Level Of Care: New/Established - Level 3 Electronic Signature(s) Signed: 10/20/2020 5:39:27 PM By: Entered By: on 10/20/2020 12:19:54 -------------------------------------------------------------------------------- Lower Extremity Assessment Details Patient Name: Date of Service: 10/20/2020 9:00 Robert M Medical Record Number: Patient Account Number: Date of Birth/Sex: Treating RN: July 13, 1961 (60 y.o. Nurse, adult Primary Care Raymonde Hamblin: Other Clinician: Referring Corie Allis: Treating Zaynab Chipman/Extender: in Treatment: 5 Edema Assessment Assessed: : No] : No] Edema: [Left: N] [Right: o] Calf Left: Right: Point of Measurement: 35 cm From Medial Instep 37.5 cm Ankle Left: Right: Point of Measurement: 11 cm From Medial Instep 22 cm Vascular Assessment Pulses: Dorsalis Pedis Palpable: [Left:Yes] Electronic Signature(s) Signed: 10/24/2020 5:56:44 PM By: Shawn Stall RN, BSN Entered By: Shawn Stall on 10/20/2020 09:16:19 -------------------------------------------------------------------------------- Multi Wound Chart Details Patient Name: Date of Service: Robert Huang, 10/22/2020 T. 10/20/2020 9:00 Robert M Medical Record Number: [id number] Patient Account Number: 12/14/1960 Date of Birth/Sex: Treating RN: 05/07/1961 (60 y.o. [name] Primary Care Schylar Wuebker: Doran Stabler Other Clinician: Referring Rhaelyn Giron: Treating Makelle Marrone/Extender: Kyra Searles in Treatment: 5 Vital Signs Height(in): 72 Capillary Blood Glucose(mg/dl): Franne Forts Weight(lbs): 10/26/2020 Pulse(bpm): 73 Body Mass Index(BMI): 28 Blood Pressure(mmHg): 145/66 Temperature(F): 98.5 Respiratory Rate(breaths/min): 16 Photos: [5:No Photos Left Metatarsal head fourth] [N/Robert:N/Robert N/Robert] Wound Location: [5:Shear/Friction] [N/Robert:N/Robert] Wounding Event: [5:Diabetic Wound/Ulcer of the Lower] [N/Robert:N/Robert] Primary Etiology: [5:Extremity Coronary Artery Disease,] [N/Robert:N/Robert] Comorbid History: [5:Hypertension, Type II Diabetes, Gout 08/29/2020] [N/Robert:N/Robert] Date Acquired: [5:5] [N/Robert:N/Robert] Weeks of Treatment: [5:Open] [N/Robert:N/Robert] Wound Status: [5:0.8x1.6x0.9] [N/Robert:N/Robert] Measurements L x W x D (cm) [5:1.005] [N/Robert:N/Robert] Robert (cm) : rea [5:0.905] [N/Robert:N/Robert] Volume (cm) : [5:71.60%] [N/Robert:N/Robert] % Reduction in Robert rea: [5:-28.00%] [N/Robert:N/Robert] % Reduction in Volume: [5:12] Starting  Position 1 (o'clock): [5:12] Ending Position 1 (o'clock): [5:0.7] Maximum Distance 1 (cm): [5:Yes] [N/Robert:N/Robert] Undermining: [5:Grade 2] [N/Robert:N/Robert] Classification: [5:Medium] [N/Robert:N/Robert] Exudate Robert mount: [5:Serosanguineous] [N/Robert:N/Robert] Exudate Type: [5:red, brown] [N/Robert:N/Robert] Exudate Color: [5:Well defined, not attached] [N/Robert:N/Robert] Wound Margin: [5:Medium (34-66%)] [N/Robert:N/Robert] Granulation Robert mount: [5:Red] [N/Robert:N/Robert] Granulation Quality: [  5:Medium (34-66%)] [N/Robert:N/Robert] Necrotic Robert mount: [5:Fat Layer (Subcutaneous Tissue): Yes N/Robert] Exposed Structures: [5:Fascia: No Tendon: No Muscle: No Joint: No Bone: No Small (1-33%)] [N/Robert:N/Robert] Treatment Notes Electronic Signature(s) Signed: 10/20/2020 5:09:37 PM By: Baltazar Najjarobson, Michael MD Signed: 10/20/2020 5:39:27 PM By: Shawn Stalleaton, Robert Huang Entered By: Baltazar Najjarobson, Michael on 10/20/2020 10:09:56 -------------------------------------------------------------------------------- Multi-Disciplinary Care Plan Details Patient Name: Date of Service: Robert SacramentoGRINDSTA FF, Robert DebarA LA N T. 10/20/2020 9:00 Robert M Medical Record Number: [id number] Patient Account Number: [id number] Date of Birth/Sex: Treating RN: 13-Sep-1960 (59 y.o. Robert FlorM) Robert Huang, Yvonne KendallBobbi Primary Care Sanaii Caporaso: Nelwyn SalisburyFry, Stephen Robert Other Clinician: Referring Patrisha Hausmann: Treating Makailyn Mccormick/Extender: Doran Stablerobson, Michael Fry, Stephen Robert Weeks in Treatment: 5 Multidisciplinary Care Plan reviewed with physician Active Inactive Abuse / Safety / Falls / Self Care Management Nursing Diagnoses: Potential for falls Goals: Patient/caregiver will verbalize/demonstrate measures taken to prevent injury and/or falls Date Initiated: 09/14/2020 Target Resolution Date: 10/28/2020 Goal Status: Active Interventions: Assess fall risk on admission and as needed Assess impairment of mobility on admission and as needed per policy Notes: Nutrition Nursing Diagnoses: Impaired glucose control: actual or potential Potential for alteratiion in Nutrition/Potential for imbalanced  nutrition Goals: Patient/caregiver will maintain therapeutic glucose control Date Initiated: 09/14/2020 Target Resolution Date: 10/28/2020 Goal Status: Active Interventions: Assess patient nutrition upon admission and as needed per policy Provide education on elevated blood sugars and impact on wound healing Treatment Activities: Patient referred to Primary Care Physician for further nutritional evaluation : 09/14/2020 Notes: Wound/Skin Impairment Nursing Diagnoses: Impaired tissue integrity Knowledge deficit related to ulceration/compromised skin integrity Goals: Patient/caregiver will verbalize understanding of skin care regimen Date Initiated: 09/14/2020 Target Resolution Date: 10/28/2020 Goal Status: Active Ulcer/skin breakdown will have Robert volume reduction of 30% by week 4 Date Initiated: 09/14/2020 Date Inactivated: 10/20/2020 Target Resolution Date: 10/12/2020 Unmet Reason: see wound Goal Status: Unmet measurements. Interventions: Assess patient/caregiver ability to obtain necessary supplies Assess patient/caregiver ability to perform ulcer/skin care regimen upon admission and as needed Assess ulceration(s) every visit Treatment Activities: Topical wound management initiated : 09/14/2020 Notes: Electronic Signature(s) Signed: 10/20/2020 5:39:27 PM By: Shawn Stalleaton, Robert Huang Entered By: Shawn Stalleaton, Robert Huang on 10/20/2020 09:46:16 -------------------------------------------------------------------------------- Pain Assessment Details Patient Name: Date of Service: Robert CookGRINDSTA FF, Robert LA N T. 10/20/2020 9:00 Robert M Medical Record Number: [id number] Patient Account Number: [id number] Date of Birth/Sex: Treating RN: 13-Sep-1960 (60 y.o. Robert Huang) Huang, Robert Primary Care [name]: Nelwyn SalisburyFry, Stephen Robert Other Clinician: Referring Iden Stripling: Treating Tatiyanna Lashley/Extender: Doran Stablerobson, Michael Fry, Stephen Robert Weeks in Treatment: 5 Active Problems Location of Pain Severity and Description of Pain Patient Has Paino Yes Site  Locations Pain Location: Pain in Ulcers With Dressing Change: Yes Duration of the Pain. Constant / Intermittento Intermittent Rate the pain. Current Pain Level: 6 Character of Pain Describe the Pain: Throbbing Pain Management and Medication Current Pain Management: Medication: Yes Cold Application: No Rest: No Massage: No Activity: No HuangE.N.S.: No Heat Application: No Leg drop or elevation: No Is the Current Pain Management Adequate: Adequate How does your wound impact your activities of daily livingo Sleep: No Bathing: No Appetite: No Relationship With Others: No Bladder Continence: No Emotions: No Bowel Continence: No Work: No Toileting: No Drive: No Dressing: No Hobbies: No Psychologist, prison and probation serviceslectronic Signature(s) Signed: 10/24/2020 5:56:44 PM By: Zandra AbtsLynch, Shatara RN, BSN Entered By: Zandra AbtsLynch, Robert on 10/20/2020 09:07:00 -------------------------------------------------------------------------------- Patient/Caregiver Education Details Patient Name: Date of Service: [name], Robert LA N T. 2/24/2022andnbsp9:00 Robert M Medical Record Number: [id number] Patient Account Number: [id number] Date of Birth/Gender: Treating RN: 13-Sep-1960 (60 y.o. Robert Huang) Robert Huang, Robert Huang Primary Care Physician:  [name] Other Clinician: Referring Physician: Treating Physician/Extender: Doran Stabler in Treatment: 5 Education Assessment Education Provided To: Patient Education Topics Provided Elevated Blood Sugar/ Impact on Healing: Handouts: Elevated Blood Sugars: How Do They Affect Wound Healing Methods: Explain/Verbal Responses: Reinforcements needed Electronic Signature(s) Signed: 10/20/2020 5:39:27 PM By: Shawn Stall Entered By: Shawn Stall on 10/20/2020 09:46:27 -------------------------------------------------------------------------------- Wound Assessment Details Patient Name: Date of Service: Robert Huang T. 10/20/2020 9:00 Robert M Medical Record Number:  [id number] Patient Account Number: [id number] Date of Birth/Sex: Treating RN: 1960-11-11 (60 y.o. Robert Huang, Robert Huang Primary Care Naomee Nowland: [name] Other Clinician: Referring Jaysion Ramseyer: Treating Glenwood Revoir/Extender: Doran Stabler in Treatment: 5 Wound Status Wound Number: 5 Primary Diabetic Wound/Ulcer of the Lower Extremity Etiology: Wound Location: Left Metatarsal head fourth Wound Status: Open Wounding Event: Shear/Friction Comorbid Coronary Artery Disease, Hypertension, Type II Diabetes, Date Acquired: 08/29/2020 History: Gout Weeks Of Treatment: 5 Clustered Wound: No Photos Photo Uploaded By: Benjaman Kindler on 10/21/2020 14:37:22 Wound Measurements Length: (cm) 0.8 Width: (cm) 1.6 Depth: (cm) 0.9 Area: (cm) 1.005 Volume: (cm) 0.905 % Reduction in Area: 71.6% % Reduction in Volume: -28% Epithelialization: Small (1-33%) Tunneling: No Undermining: Yes Starting Position (o'clock): 12 Ending Position (o'clock): 12 Maximum Distance: (cm) 0.7 Wound Description Classification: Grade 2 Wound Margin: Well defined, not attached Exudate Amount: Medium Exudate Type: Serosanguineous Exudate Color: red, brown Foul Odor After Cleansing: No Slough/Fibrino Yes Wound Bed Granulation Amount: Medium (34-66%) Exposed Structure Granulation Quality: Red Fascia Exposed: No Necrotic Amount: Medium (34-66%) Fat Layer (Subcutaneous Tissue) Exposed: Yes Necrotic Quality: Adherent Slough Tendon Exposed: No Muscle Exposed: No Joint Exposed: No Bone Exposed: No Electronic Signature(s) Signed: 10/20/2020 5:39:27 PM By: Shawn Stall Signed: 10/24/2020 5:56:44 PM By: Zandra Abts RN, BSN Entered By: Zandra Abts on 10/20/2020 09:17:32 -------------------------------------------------------------------------------- Vitals Details Patient Name: Date of Service: Robert Huang, Robert Debar T. 10/20/2020 9:00 Robert M Medical Record Number: [id number] Patient Account Number:  [id number] Date of Birth/Sex: Treating RN: 1961/03/08 (59 y.o. Robert Huang Primary Care Seamus Warehime: [name] Other Clinician: Referring Breaunna Gottlieb: Treating Catrice Zuleta/Extender: Doran Stabler in Treatment: 5 Vital Signs Time Taken: 09:07 Temperature (F): 98.5 Height (in): 72 Pulse (bpm): 73 Weight (lbs): 205 Respiratory Rate (breaths/min): 16 Body Mass Index (BMI): 27.8 Blood Pressure (mmHg): 145/66 Capillary Blood Glucose (mg/dl): 053 Reference Range: 80 - 120 mg / dl Notes glucose per pt report Electronic Signature(s) Signed: 10/24/2020 5:56:44 PM By: Zandra Abts RN, BSN Entered By: Zandra Abts on 10/20/2020 09:08:29

## 2020-10-27 ENCOUNTER — Encounter (HOSPITAL_BASED_OUTPATIENT_CLINIC_OR_DEPARTMENT_OTHER): Payer: 59 | Attending: Internal Medicine | Admitting: Physician Assistant

## 2020-10-27 DIAGNOSIS — E1140 Type 2 diabetes mellitus with diabetic neuropathy, unspecified: Secondary | ICD-10-CM | POA: Insufficient documentation

## 2020-10-27 DIAGNOSIS — Z955 Presence of coronary angioplasty implant and graft: Secondary | ICD-10-CM | POA: Insufficient documentation

## 2020-10-27 DIAGNOSIS — L97524 Non-pressure chronic ulcer of other part of left foot with necrosis of bone: Secondary | ICD-10-CM | POA: Insufficient documentation

## 2020-10-27 DIAGNOSIS — Z03818 Encounter for observation for suspected exposure to other biological agents ruled out: Secondary | ICD-10-CM | POA: Diagnosis not present

## 2020-10-27 DIAGNOSIS — E11621 Type 2 diabetes mellitus with foot ulcer: Secondary | ICD-10-CM | POA: Insufficient documentation

## 2020-10-27 DIAGNOSIS — Z20822 Contact with and (suspected) exposure to covid-19: Secondary | ICD-10-CM | POA: Diagnosis not present

## 2020-10-27 DIAGNOSIS — I10 Essential (primary) hypertension: Secondary | ICD-10-CM | POA: Insufficient documentation

## 2020-10-27 DIAGNOSIS — Z89432 Acquired absence of left foot: Secondary | ICD-10-CM | POA: Insufficient documentation

## 2020-10-27 DIAGNOSIS — I2510 Atherosclerotic heart disease of native coronary artery without angina pectoris: Secondary | ICD-10-CM | POA: Insufficient documentation

## 2020-10-31 ENCOUNTER — Other Ambulatory Visit: Payer: Self-pay

## 2020-10-31 ENCOUNTER — Encounter (HOSPITAL_BASED_OUTPATIENT_CLINIC_OR_DEPARTMENT_OTHER): Payer: 59 | Attending: Physician Assistant | Admitting: Physician Assistant

## 2020-10-31 DIAGNOSIS — L97522 Non-pressure chronic ulcer of other part of left foot with fat layer exposed: Secondary | ICD-10-CM | POA: Diagnosis not present

## 2020-10-31 DIAGNOSIS — L97524 Non-pressure chronic ulcer of other part of left foot with necrosis of bone: Secondary | ICD-10-CM | POA: Insufficient documentation

## 2020-10-31 DIAGNOSIS — Z955 Presence of coronary angioplasty implant and graft: Secondary | ICD-10-CM | POA: Diagnosis not present

## 2020-10-31 DIAGNOSIS — E11621 Type 2 diabetes mellitus with foot ulcer: Secondary | ICD-10-CM | POA: Insufficient documentation

## 2020-10-31 DIAGNOSIS — L03116 Cellulitis of left lower limb: Secondary | ICD-10-CM | POA: Diagnosis not present

## 2020-10-31 DIAGNOSIS — I10 Essential (primary) hypertension: Secondary | ICD-10-CM | POA: Insufficient documentation

## 2020-10-31 DIAGNOSIS — E1140 Type 2 diabetes mellitus with diabetic neuropathy, unspecified: Secondary | ICD-10-CM | POA: Diagnosis not present

## 2020-10-31 DIAGNOSIS — I2510 Atherosclerotic heart disease of native coronary artery without angina pectoris: Secondary | ICD-10-CM | POA: Insufficient documentation

## 2020-10-31 DIAGNOSIS — L97512 Non-pressure chronic ulcer of other part of right foot with fat layer exposed: Secondary | ICD-10-CM | POA: Diagnosis not present

## 2020-10-31 NOTE — Progress Notes (Addendum)
MIHAIL, MORELAN T ([id number]) . Visit Report for 10/31/2020 Chief Complaint Document Details Patient Name: Date of Service: Devin Going 10/31/2020 3:45 PM Medical Record Number: [id number] Patient Account Number: [id number] Date of Birth/Sex: Treating RN: 18-Jun-1961 (60 y.o. Elizebeth Koller Primary Care Provider: [name] Other Clinician: Referring Provider: Treating Provider/Extender: Lucie Leather in Treatment: 6 Information Obtained from: Patient Chief Complaint Bilateral foot ulcers Electronic Signature(s) Signed: 10/31/2020 4:15:14 PM By: Lenda Kelp PA-C Entered By: Lenda Kelp on 10/31/2020 16:15:13 -------------------------------------------------------------------------------- Debridement Details Patient Name: Date of Service: Devin Going 10/31/2020 3:45 PM Medical Record Number: [id number] Patient Account Number: [id number] Date of Birth/Sex: Treating RN: Jul 02, 1961 (60 y.o. Elizebeth Koller Primary Care Provider: [name] Other Clinician: Referring Provider: Treating Provider/Extender: Lucie Leather in Treatment: 6 Debridement Performed for Assessment: Wound #5 Left Metatarsal head fourth Performed By: Physician Lenda Kelp, PA Debridement Type: Debridement Severity of Tissue Pre Debridement: Fat layer exposed Level of Consciousness (Pre-procedure): Awake and Alert Pre-procedure Verification/Time Out Yes - 17:14 Taken: Start Time: 17:14 T Area Debrided (L x W): otal 1 (cm) x 1.2 (cm) = 1.2 (cm) Tissue and other material debrided: Viable, Non-Viable, Callus, Slough, Subcutaneous, Slough Level: Skin/Subcutaneous Tissue Debridement Description: Excisional Instrument: Curette Bleeding: Minimum Hemostasis Achieved: Pressure End Time: 17:16 Procedural Pain: 0 Post Procedural Pain: 0 Response to Treatment: Procedure was tolerated well Level of Consciousness (Post- Awake and  Alert procedure): Post Debridement Measurements of Total Wound Length: (cm) 1 Width: (cm) 1.2 Depth: (cm) 0.6 Volume: (cm) 0.565 Character of Wound/Ulcer Post Debridement: Requires Further Debridement Severity of Tissue Post Debridement: Fat layer exposed Post Procedure Diagnosis Same as Pre-procedure Electronic Signature(s) Signed: 10/31/2020 5:52:07 PM By: Lenda Kelp PA-C Signed: 11/01/2020 6:01:52 PM By: Zandra Abts RN, BSN Entered By: Zandra Abts on 10/31/2020 17:15:27 -------------------------------------------------------------------------------- HPI Details Patient Name: Date of Service: [name], [name] 10/31/2020 3:45 PM Medical Record Number: [id number] Patient Account Number: [id number] Date of Birth/Sex: Treating RN: 04-01-1961 (60 y.o. Elizebeth Koller Primary Care Provider: [name] Other Clinician: Referring Provider: Treating Provider/Extender: Lucie Leather in Treatment: 6 History of Present Illness HPI Description: ADMISSION 05/27/2020 This is a 60 year old man who has type 2 diabetes who is here for wounds on his left foot in 2 locations. The patient has had problems with foot ulcers requiring surgery including a right great toe amputation and a left fifth ray amputation in July 2020 for underlying osteomyelitis although strangely I cannot see any pathology or cultures done in this area. He states that the area on his left fourth met head plantar aspect opened about a month ago. He has had continuous callus in this area. He saw Dr. Clent Ridges his primary doctor at the beginning of September and was felt to have a cellulitis in the lateral part of his foot in this area he was given a shot of Rocephin and then ongoing antibiotics with Keflex for 10 days. It would appear that he was supposed to follow-up with Dr. Clent Ridges although I do not see these notes. About a week and a half ago he stubbed his left first toe and has a wound on this  area. He is using Neosporin to both wound areas. 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 He is already been called out of work for a month to work from home because a to offload the foot by Dr. Clent Ridges ABI in our clinic was 1.23 on the left 10/8; the patient's area over the left fourth metatarsal head is fully closed. This was a split callus last week that I debrided. He still has an open area over the tip of his left great toe. He is using a forefoot offloading and silver alginate. We made him an appointment with [facility] to see about custom made diabetic shoes with inserts this appointment is for next Tuesday. Readmission: 09/14/2020 upon evaluation today patient appears to have several wounds over his left dorsal great toe, left fourth metatarsal head, and right second toe. The good news is none of these appear to be severe at this point. Nonetheless unfortunately he is going require a little bit of the debridement today to try to clear away some of the necrotic debris currently. Specifically the left metatarsal head of the fourth metatarsal does look particularly is a wound that is good to require sharp debridement. The patient does have a history of diabetes mellitus type 2, hypertension, and diabetic neuropathy. He has previously been seen here in the clinic and I am not certain that he is ever really have this completely healed although we have not seen him since October 2020. Fortunately his ABIs appear to be doing excellent today. 1/27; patient was readmitted to the clinic last week. He had an area over his right second toe which is healed also of the left dorsal great toe is also healed. He attributes these wounds to a hunting trip he took with boots that perhaps were not helpful on his  feet. He still has the area on the fourth met head status post left fifth ray amputation in July 20 2/3; left fourth metatarsal head. Wound is about the same size as last week some debris around the circumference of the wound including callus and dry skin I removed this knocked down the circumference hopefully to make the surrounding tissue a little less senescent. Still using silver collagen. I talked to him about a total contact cast 2/17; 2-week follow-up. Left fourth metatarsal head. He is status post fifth ray amputation in July 2020. I thought we would be able to put a total contact cast on him however he arrives with a dramatic worsening of the wound with increased depth to bone as well as undermining from 6-3 o'clock. Scant amount of purulent drainage which I have cultured. This deterioration this week would obviously preclude a total contact cast. 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 He has a forefoot offloading boot 2/24; culture of this wound that I did last time after registering a fairly dramatic deterioration with exposed bone showed MRSA and Enterococcus faecalis. After some difficulty with insurance and cost of linezolid he is taking that now. T olerating it well. He will definitely need an MRI of the foot and I have ordered this. Using silver alginate on the wound 10/31/2020 upon evaluation today patient appears to be doing well with regard to his wound. Fortunately there is no signs of active infection at this time. In fact I feel like that the the linezolid did a great job for him. Overall  I am very pleased in that regard. Electronic Signature(s) Signed: 10/31/2020 5:43:51 PM By: Lenda Kelp PA-C Entered By: Lenda Kelp on 10/31/2020 17:43:50 -------------------------------------------------------------------------------- Physical Exam Details Patient Name:  Date of Service: Devin Going 10/31/2020 3:45 PM Medical Record Number: [id number] Patient Account Number: [id number] Date of Birth/Sex: Treating RN: 06/15/1961 (60 y.o. Elizebeth Koller Primary Care Provider: [name] Other Clinician: Referring Provider: Treating Provider/Extender: Lucie Leather in Treatment: 6 Constitutional Well-nourished and well-hydrated in no acute distress. Respiratory normal breathing without difficulty. Psychiatric this patient is able to make decisions and demonstrates good insight into disease process. Alert and Oriented x 3. pleasant and cooperative. Notes Upon inspection patient's wound bed actually showed signs of good granulation at this time. There does not appear to be any evidence of active infection which is excellent and overall I think that he is making excellent progress. No fevers, chills, nausea, vomiting, or diarrhea. Electronic Signature(s) Signed: 10/31/2020 5:44:20 PM By: Lenda Kelp PA-C Entered By: Lenda Kelp on 10/31/2020 17:44:19 -------------------------------------------------------------------------------- Physician Orders Details Patient Name: Date of Service: [name] 10/31/2020 3:45 PM Medical Record Number: [id number] Patient Account Number: [id number] Date of Birth/Sex: Treating RN: 07-11-1961 (60 y.o. Elizebeth Koller Primary Care Provider: [name] Other Clinician: Referring Provider: Treating Provider/Extender: Lucie Leather in Treatment: 6 Verbal / Phone Orders: No Diagnosis Coding ICD-10 Coding Code Description E11.621 Type 2 diabetes mellitus with foot ulcer L97.522 Non-pressure chronic ulcer of other part of left foot with fat layer exposed L97.512 Non-pressure chronic ulcer of other part of right foot with fat layer exposed E11.40 Type 2 diabetes mellitus with diabetic neuropathy, unspecified I10 Essential (primary)  hypertension L03.116 Cellulitis of left lower limb Follow-up Appointments Return Appointment in 1 week. Bathing/ Shower/ Hygiene May shower and wash wound with soap and water. Off-Loading Wedge shoe to: - left foot to ambulate Non Wound Condition Protect area with: - pad with foam or gauze to right 2nd toe. Wound Treatment Wound #5 - Metatarsal head fourth Wound Laterality: Left Cleanser: Soap and Water Every Other Day/7 Days Discharge Instructions: May shower and wash wound with dial antibacterial soap and water prior to dressing change. Prim Dressing: IODOFLEX 0.9% Cadexomer Iodine Pad 4x6 cm Every Other Day/7 Days ary Discharge Instructions: Apply to wound bed as instructed Secondary Dressing: Woven Gauze Sponges 2x2 in Every Other Day/7 Days Discharge Instructions: Apply over primary dressing as directed. Secondary Dressing: Optifoam Non-Adhesive Dressing, 4x4 in Every Other Day/7 Days Discharge Instructions: Apply over primary dressing cut to make foam donut Secured With: Conforming Stretch Gauze Bandage, Sterile 2x75 (in/in) Every Other Day/7 Days Discharge Instructions: Secure with stretch gauze as directed. Secured With: Paper Tape, 2x10 (in/yd) Every Other Day/7 Days Discharge Instructions: Secure dressing with tape as directed. Electronic Signature(s) Signed: 10/31/2020 5:52:07 PM By: Lenda Kelp PA-C Signed: 11/01/2020 6:01:52 PM By: Zandra Abts RN, BSN Entered By: Zandra Abts on 10/31/2020 17:17:49 -------------------------------------------------------------------------------- Problem List Details Patient Name: Date of Service: [name], [name] 10/31/2020 3:45 PM Medical Record Number: [id number] Patient Account Number: [id number] Date of Birth/Sex: Treating RN: 27-Feb-1961 (59 y.o. Elizebeth Koller Primary Care Provider: [name] Other Clinician: Referring Provider: Treating Provider/Extender: Lucie Leather in Treatment:  6 Active Problems ICD-10 Encounter Code Description Active Date MDM Diagnosis E11.621 Type 2 diabetes mellitus with foot ulcer  09/14/2020 No Yes L97.522 Non-pressure chronic ulcer of other part of left foot with fat layer exposed 09/14/2020 No Yes L97.512 Non-pressure chronic ulcer of other part of right foot with fat layer exposed 09/14/2020 No Yes E11.40 Type 2 diabetes mellitus with diabetic neuropathy, unspecified 09/14/2020 No Yes I10 Essential (primary) hypertension 09/14/2020 No Yes L03.116 Cellulitis of left lower limb 10/20/2020 No Yes Inactive Problems Resolved Problems Electronic Signature(s) Signed: 10/31/2020 4:15:08 PM By: Lenda Kelp PA-C Entered By: Lenda Kelp on 10/31/2020 16:15:08 -------------------------------------------------------------------------------- Progress Note Details Patient Name: Date of Service: [name] 10/31/2020 3:45 PM Medical Record Number: [id number] Patient Account Number: [id number] Date of Birth/Sex: Treating RN: [birth year] (59 y.o. Elizebeth Koller Primary Care Provider: [name] Other Clinician: Referring Provider: Treating Provider/Extender: Lucie Leather in Treatment: 6 Subjective Chief Complaint Information obtained from Patient Bilateral foot ulcers History of Present Illness (HPI) ADMISSION 05/27/2020 This is a 61 year old man who has type 2 diabetes who is here for wounds on his left foot in 2 locations. The patient has had problems with foot ulcers requiring surgery including a right great toe amputation and a left fifth ray amputation in July 2020 for underlying osteomyelitis although strangely I cannot see any pathology or cultures done in this area. He states that the area on his left fourth met head plantar aspect opened about a month ago. He has had continuous callus in this area. He saw Dr. Clent Ridges his primary doctor at the beginning of September and was felt to have a cellulitis in  the lateral part of his foot in this area he was given a shot of Rocephin and then ongoing antibiotics with Keflex for 10 days. It would appear that he was supposed to follow-up with Dr. Clent Ridges although I do not see these notes. About a week and a half ago he stubbed his left first toe and has a wound on this area. He is using Neosporin to both wound areas. 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 He is already been called out of work for a month to work from home because a to offload the foot by Dr. Clent Ridges ABI in our clinic was 1.23 on the left 10/8; the patient's area over the left fourth metatarsal head is fully closed. This was a split callus last week that I debrided. He still has an open area over the tip of his left great toe. He is using a forefoot offloading and silver alginate. We made him an appointment with [facility] to see about custom made diabetic shoes with inserts this appointment is for next Tuesday. Readmission: 09/14/2020 upon evaluation today patient appears to have several wounds over his left dorsal great toe, left fourth metatarsal head, and right second toe. The good news is none of these appear to be severe at this point. Nonetheless unfortunately he is going require a little bit of the debridement today to try to clear away some of the necrotic debris currently. Specifically the left metatarsal head of the fourth metatarsal does look particularly is a wound that is good to require sharp debridement. The patient does have a history of diabetes mellitus type 2, hypertension, and diabetic neuropathy. He has previously been seen here in the clinic and I am not certain that he  is ever really have this completely healed although we have not seen him since October 2020. Fortunately  his ABIs appear to be doing excellent today. 1/27; patient was readmitted to the clinic last week. He had an area over his right second toe which is healed also of the left dorsal great toe is also healed. He attributes these wounds to a hunting trip he took with boots that perhaps were not helpful on his feet. He still has the area on the fourth met head status post left fifth ray amputation in July 20 2/3; left fourth metatarsal head. Wound is about the same size as last week some debris around the circumference of the wound including callus and dry skin I removed this knocked down the circumference hopefully to make the surrounding tissue a little less senescent. Still using silver collagen. I talked to him about a total contact cast 2/17; 2-week follow-up. Left fourth metatarsal head. He is status post fifth ray amputation in July 2020. I thought we would be able to put a total contact cast on him however he arrives with a dramatic worsening of the wound with increased depth to bone as well as undermining from 6-3 o'clock. Scant amount of purulent drainage which I have cultured. This deterioration this week would obviously preclude a total contact cast. 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 He has a forefoot offloading boot 2/24; culture of this wound that I did last time after registering a fairly dramatic deterioration with exposed bone showed MRSA and Enterococcus faecalis. After some difficulty with insurance and cost of linezolid he is taking that now. T olerating it well. He will definitely need an MRI of the foot and I have ordered this. Using silver alginate on the wound 10/31/2020 upon evaluation today patient appears to be doing well with regard to his wound. Fortunately there is no signs of active infection at this time. In fact I feel like that the the linezolid did a  great job for him. Overall I am very pleased in that regard. Objective Constitutional Well-nourished and well-hydrated in no acute distress. Vitals Time Taken: 4:17 PM, Height: 72 in, Weight: 205 lbs, BMI: 27.8, Temperature: 98.3 F, Pulse: 60 bpm, Respiratory Rate: 17 breaths/min, Blood Pressure: 142/64 mmHg, Capillary Blood Glucose: 163 mg/dl. Respiratory normal breathing without difficulty. Psychiatric this patient is able to make decisions and demonstrates good insight into disease process. Alert and Oriented x 3. pleasant and cooperative. General Notes: Upon inspection patient's wound bed actually showed signs of good granulation at this time. There does not appear to be any evidence of active infection which is excellent and overall I think that he is making excellent progress. No fevers, chills, nausea, vomiting, or diarrhea. Integumentary (Hair, Skin) Wound #5 status is Open. Original cause of wound was Shear/Friction. The date acquired was: 08/29/2020. The wound has been in treatment 6 weeks. The wound is located on the Left Metatarsal head fourth. The wound measures 1cm length x 1.2cm width x 0.6cm depth; 0.942cm^2 area and 0.565cm^3 volume. There is Fat Layer (Subcutaneous Tissue) exposed. There is no tunneling or undermining noted. There is a medium amount of serosanguineous drainage noted. The wound margin is well defined and not attached to the wound base. There is medium (34-66%) red granulation within the wound bed. There is a medium (34-66%) amount of necrotic tissue within  the wound bed including Adherent Slough. Assessment Active Problems ICD-10 Type 2 diabetes mellitus with foot ulcer Non-pressure chronic ulcer of other part of left foot with fat layer exposed Non-pressure chronic ulcer of other part of right foot with fat layer exposed Type 2 diabetes mellitus with diabetic neuropathy, unspecified Essential (primary) hypertension Cellulitis of left lower  limb Procedures Wound #5 Pre-procedure diagnosis of Wound #5 is a Diabetic Wound/Ulcer of the Lower Extremity located on the Left Metatarsal head fourth .Severity of Tissue Pre Debridement is: Fat layer exposed. There was a Excisional Skin/Subcutaneous Tissue Debridement with a total area of 1.2 sq cm performed by Lenda Kelp, PA. With the following instrument(s): Curette to remove Viable and Non-Viable tissue/material. Material removed includes Callus, Subcutaneous Tissue, and Slough. No specimens were taken. A time out was conducted at 17:14, prior to the start of the procedure. A Minimum amount of bleeding was controlled with Pressure. The procedure was tolerated well with a pain level of 0 throughout and a pain level of 0 following the procedure. Post Debridement Measurements: 1cm length x 1.2cm width x 0.6cm depth; 0.565cm^3 volume. Character of Wound/Ulcer Post Debridement requires further debridement. Severity of Tissue Post Debridement is: Fat layer exposed. Post procedure Diagnosis Wound #5: Same as Pre-Procedure Plan Follow-up Appointments: Return Appointment in 1 week. Bathing/ Shower/ Hygiene: May shower and wash wound with soap and water. Off-Loading: Wedge shoe to: - left foot to ambulate Non Wound Condition: Protect area with: - pad with foam or gauze to right 2nd toe. WOUND #5: - Metatarsal head fourth Wound Laterality: Left Cleanser: Soap and Water Every Other Day/7 Days Discharge Instructions: May shower and wash wound with dial antibacterial soap and water prior to dressing change. Prim Dressing: IODOFLEX 0.9% Cadexomer Iodine Pad 4x6 cm Every Other Day/7 Days ary Discharge Instructions: Apply to wound bed as instructed Secondary Dressing: Woven Gauze Sponges 2x2 in Every Other Day/7 Days Discharge Instructions: Apply over primary dressing as directed. Secondary Dressing: Optifoam Non-Adhesive Dressing, 4x4 in Every Other Day/7 Days Discharge Instructions: Apply  over primary dressing cut to make foam donut Secured With: Conforming Stretch Gauze Bandage, Sterile 2x75 (in/in) Every Other Day/7 Days Discharge Instructions: Secure with stretch gauze as directed. Secured With: Paper Tape, 2x10 (in/yd) Every Other Day/7 Days Discharge Instructions: Secure dressing with tape as directed. 1. Would recommend at this point that we have the patient go ahead and continue with the wound care measures as before and specifically I think post debridement wound bed appears to be doing better I do think however we may switch over to Iodosorb or Iodoflex which I think would be better to help clean up the surface of the wound. He does have some alginate that we previously used that may be something to go back to after a little bit but I feel like this may help to clean up the surface much more effectively. 2. I am also can recommend the patient continue to monitor for any signs of infection. Obviously if anything ensues he should let us know soon as possible. We will see patient back for reevaluation in 1 week here in the clinic. If anything worsens or changes patient will contact our office for additional recommendations. Electronic Signature(s) Signed: 10/31/2020 5:47:01 PM By: Lenda Kelp PA-C Entered By: Lenda Kelp on 10/31/2020 17:47:01 -------------------------------------------------------------------------------- SuperBill Details Patient Name: Date of Service: [name], Karie Fetch 10/31/2020 Medical Record Number: [id number] Patient Account Number: [id number] Date of Birth/Sex: Treating RN:  July 05, 1961 (59 y.o. Elizebeth Koller Primary Care Provider: [name] Other Clinician: Referring Provider: Treating Provider/Extender: Lucie Leather in Treatment: 6 Diagnosis Coding ICD-10 Codes Code Description [phone] Type 2 diabetes mellitus with foot ulcer L97.522 Non-pressure chronic ulcer of other part of left foot with fat  layer exposed L97.512 Non-pressure chronic ulcer of other part of right foot with fat layer exposed E11.40 Type 2 diabetes mellitus with diabetic neuropathy, unspecified I10 Essential (primary) hypertension L03.116 Cellulitis of left lower limb Facility Procedures CPT4 Code: [id number] Description: 11042 - DEB SUBQ TISSUE 20 SQ CM/< ICD-10 Diagnosis Description L97.522 Non-pressure chronic 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

## 2020-11-01 ENCOUNTER — Other Ambulatory Visit: Payer: Self-pay | Admitting: Family Medicine

## 2020-11-02 NOTE — Progress Notes (Signed)
Robert Huang ([id number]) . Visit Report for 10/31/2020 Arrival Information Details Patient Name: Date of Service: Robert Huang 10/31/2020 3:45 PM Medical Record Number: [id number] Patient Account Number: [id number] Date of Birth/Sex: Treating RN: 1960-11-02 (60 y.o. Charlean Merl, [name] Primary Care Kaysha Parsell: [name] Other Clinician: Referring Maxon Kresse: Treating Chey Rachels/Extender: Lucie Leather in Treatment: 6 Visit Information History Since Last Visit Added or deleted any medications: No Patient Arrived: Ambulatory Any new allergies or adverse reactions: No Arrival Time: 16:16 Had a fall or experienced change in No Accompanied By: self activities of daily living that may affect Transfer Assistance: None risk of falls: Patient Identification Verified: Yes Signs or symptoms of abuse/neglect since last visito No Secondary Verification Process Completed: Yes Hospitalized since last visit: No Patient Requires Transmission-Based Precautions: No Implantable device outside of the clinic excluding No Patient Has Alerts: Yes cellular tissue based products placed in the center Patient Alerts: R ABI: 0.96 since last visit: L ABI: 1.19 Has Dressing in Place as Prescribed: Yes Pain Present Now: No Electronic Signature(s) Signed: 10/31/2020 5:34:21 PM By: Fonnie Mu RN Entered By: Fonnie Mu on 10/31/2020 16:17:21 -------------------------------------------------------------------------------- Lower Extremity Assessment Details Patient Name: Date of Service: Robert Cook Huang. 10/31/2020 3:45 PM Medical Record Number: [id number] Patient Account Number: [id number] Date of Birth/Sex: Treating RN: Oct 08, 1960 (59 y.o. Charlean Merl, [name] Primary Care Casmer Yepiz: [name] Other Clinician: Referring Rashan Rounsaville: Treating Carmina Walle/Extender: Lucie Leather in Treatment: 6 Edema Assessment Assessed: Kyra Searles: No]  [Right: No] Edema: [Left: N] [Right: o] Calf Left: Right: Point of Measurement: 35 cm From Medial Instep 37.5 cm Ankle Left: Right: Point of Measurement: 11 cm From Medial Instep 22 cm Vascular Assessment Pulses: Dorsalis Pedis Palpable: [Left:Yes] Posterior Tibial Palpable: [Left:Yes] Electronic Signature(s) Signed: 10/31/2020 5:34:21 PM By: Fonnie Mu RN Entered By: Fonnie Mu on 10/31/2020 16:19:17 -------------------------------------------------------------------------------- Multi-Disciplinary Care Plan Details Patient Name: Date of Service: [name], Robert Debar Huang. 10/31/2020 3:45 PM Medical Record Number: [id number] Patient Account Number: [id number] Date of Birth/Sex: Treating RN: 1960/11/24 (59 y.o. Elizebeth Koller Primary Care Kemal Amores: [name] Other Clinician: Referring Mckenzye Cutright: Treating Kelso Bibby/Extender: Lucie Leather in Treatment: 6 Multidisciplinary Care Plan reviewed with physician Active Inactive Nutrition Nursing Diagnoses: Impaired glucose control: actual or potential Potential for alteratiion in Nutrition/Potential for imbalanced nutrition Goals: Patient/caregiver will maintain therapeutic glucose control Date Initiated: 09/14/2020 Target Resolution Date: 11/11/2020 Goal Status: Active Interventions: Assess patient nutrition upon admission and as needed per policy Provide education on elevated blood sugars and impact on wound healing Treatment Activities: Patient referred to Primary Care Physician for further nutritional evaluation : 09/14/2020 Notes: Wound/Skin Impairment Nursing Diagnoses: Impaired tissue integrity Knowledge deficit related to ulceration/compromised skin integrity Goals: Patient/caregiver will verbalize understanding of skin care regimen Date Initiated: 09/14/2020 Target Resolution Date: 11/11/2020 Goal Status: Active Ulcer/skin breakdown will have a volume reduction of 30% by week  4 Date Initiated: 09/14/2020 Date Inactivated: 10/20/2020 Target Resolution Date: 10/12/2020 Unmet Reason: see wound Goal Status: Unmet measurements. Interventions: Assess patient/caregiver ability to obtain necessary supplies Assess patient/caregiver ability to perform ulcer/skin care regimen upon admission and as needed Assess ulceration(s) every visit Treatment Activities: Topical wound management initiated : 09/14/2020 Notes: Electronic Signature(s) Signed: 11/01/2020 6:01:52 PM By: Zandra Abts RN, BSN Entered By: Zandra Abts on 10/31/2020 17:13:36 -------------------------------------------------------------------------------- Pain Assessment Details Patient Name: Date of Service: Robert FF, [name].  10/31/2020 3:45 PM Medical Record Number: [id number] Patient Account Number: [id number] Date of Birth/Sex: Treating RN: 04-Jan-1961 (60 y.o. Charlean Merl, [name] Primary Care Fay Swider: [name] Other Clinician: Referring Ifeoluwa Beller: Treating Nikitta Sobiech/Extender: Lucie Leather in Treatment: 6 Active Problems Location of Pain Severity and Description of Pain Patient Has Paino No Site Locations Pain Management and Medication Current Pain Management: Electronic Signature(s) Signed: 10/31/2020 5:34:21 PM By: Fonnie Mu RN Entered By: Fonnie Mu on 10/31/2020 16:19:05 -------------------------------------------------------------------------------- Patient/Caregiver Education Details Patient Name: Date of Service: Robert Huang 3/7/2022andnbsp3:45 PM Medical Record Number: [id number] Patient Account Number: [id number] Date of Birth/Gender: Treating RN: 1961-05-05 (60 y.o. Elizebeth Koller Primary Care Physician: [name] Other Clinician: Referring Physician: Treating Physician/Extender: Lucie Leather in Treatment: 6 Education Assessment Education Provided To: Patient Education Topics  Provided Wound/Skin Impairment: Methods: Explain/Verbal Responses: State content correctly Electronic Signature(s) Signed: 11/01/2020 6:01:52 PM By: Zandra Abts RN, BSN Entered By: Zandra Abts on 10/31/2020 17:13:53 -------------------------------------------------------------------------------- Wound Assessment Details Patient Name: Date of Service: Robert Huang 10/31/2020 3:45 PM Medical Record Number: [id number] Patient Account Number: [id number] Date of Birth/Sex: Treating RN: [birth year] (59 y.o. Charlean Merl, [name] Primary Care Tarun Patchell: [name] Other Clinician: Referring Sadi Arave: Treating Mickell Birdwell/Extender: Lucie Leather in Treatment: 6 Wound Status Wound Number: 5 Primary Diabetic Wound/Ulcer of the Lower Extremity Etiology: Wound Location: Left Metatarsal head fourth Wound Status: Open Wounding Event: Shear/Friction Comorbid Coronary Artery Disease, Hypertension, Type II Diabetes, Date Acquired: 08/29/2020 History: Gout Weeks Of Treatment: 6 Clustered Wound: No Photos Wound Measurements Length: (cm) 1 Width: (cm) 1.2 Depth: (cm) 0.6 Area: (cm) 0.942 Volume: (cm) 0.565 % Reduction in Area: 73.3% % Reduction in Volume: 20.1% Epithelialization: Small (1-33%) Tunneling: No Undermining: No Wound Description Classification: Grade 2 Wound Margin: Well defined, not attached Exudate Amount: Medium Exudate Type: Serosanguineous Exudate Color: red, brown Foul Odor After Cleansing: No Slough/Fibrino Yes Wound Bed Granulation Amount: Medium (34-66%) Exposed Structure Granulation Quality: Red Fascia Exposed: No Necrotic Amount: Medium (34-66%) Fat Layer (Subcutaneous Tissue) Exposed: Yes Necrotic Quality: Adherent Slough Tendon Exposed: No Muscle Exposed: No Joint Exposed: No Bone Exposed: No Electronic Signature(s) Signed: 11/01/2020 11:11:03 AM By: Karl Ito Signed: 11/02/2020 5:36:09 PM By: Fonnie Mu  RN Previous Signature: 10/31/2020 5:34:21 PM Version By: Fonnie Mu RN Entered By: Karl Ito on 11/01/2020 10:56:25 -------------------------------------------------------------------------------- Vitals Details Patient Name: Date of Service: [name], Robert Debar Huang. 10/31/2020 3:45 PM Medical Record Number: [id number] Patient Account Number: [id number] Date of Birth/Sex: Treating RN: 1961/04/01 (59 y.o. Charlean Merl, [name] Primary Care Derold Dorsch: [name] Other Clinician: Referring Cecilia Nishikawa: Treating Siani Utke/Extender: Lucie Leather in Treatment: 6 Vital Signs Time Taken: 16:17 Temperature (F): 98.3 Height (in): 72 Pulse (bpm): 60 Weight (lbs): 205 Respiratory Rate (breaths/min): 17 Body Mass Index (BMI): 27.8 Blood Pressure (mmHg): 142/64 Capillary Blood Glucose (mg/dl): 381 Reference Range: 80 - 120 mg / dl Electronic Signature(s) Signed: 10/31/2020 5:34:21 PM By: Fonnie Mu RN Entered By: Fonnie Mu on 10/31/2020 16:18:56

## 2020-11-07 ENCOUNTER — Other Ambulatory Visit: Payer: Self-pay

## 2020-11-07 ENCOUNTER — Encounter (HOSPITAL_BASED_OUTPATIENT_CLINIC_OR_DEPARTMENT_OTHER): Payer: 59 | Admitting: Internal Medicine

## 2020-11-07 ENCOUNTER — Other Ambulatory Visit (HOSPITAL_COMMUNITY)
Admission: RE | Admit: 2020-11-07 | Discharge: 2020-11-07 | Disposition: A | Discharge status: Home / Self Care | Payer: 59 | Source: Other Acute Inpatient Hospital | Attending: Internal Medicine | Admitting: Internal Medicine

## 2020-11-07 DIAGNOSIS — Z955 Presence of coronary angioplasty implant and graft: Secondary | ICD-10-CM | POA: Diagnosis not present

## 2020-11-07 DIAGNOSIS — E11621 Type 2 diabetes mellitus with foot ulcer: Secondary | ICD-10-CM | POA: Diagnosis not present

## 2020-11-07 DIAGNOSIS — L97512 Non-pressure chronic ulcer of other part of right foot with fat layer exposed: Secondary | ICD-10-CM | POA: Diagnosis not present

## 2020-11-07 DIAGNOSIS — I10 Essential (primary) hypertension: Secondary | ICD-10-CM | POA: Diagnosis not present

## 2020-11-07 DIAGNOSIS — E1140 Type 2 diabetes mellitus with diabetic neuropathy, unspecified: Secondary | ICD-10-CM | POA: Diagnosis not present

## 2020-11-07 DIAGNOSIS — L97522 Non-pressure chronic ulcer of other part of left foot with fat layer exposed: Secondary | ICD-10-CM | POA: Insufficient documentation

## 2020-11-07 DIAGNOSIS — L97524 Non-pressure chronic ulcer of other part of left foot with necrosis of bone: Secondary | ICD-10-CM | POA: Diagnosis not present

## 2020-11-07 DIAGNOSIS — I2510 Atherosclerotic heart disease of native coronary artery without angina pectoris: Secondary | ICD-10-CM | POA: Diagnosis not present

## 2020-11-07 DIAGNOSIS — L97526 Non-pressure chronic ulcer of other part of left foot with bone involvement without evidence of necrosis: Secondary | ICD-10-CM | POA: Diagnosis not present

## 2020-11-07 DIAGNOSIS — L03116 Cellulitis of left lower limb: Secondary | ICD-10-CM | POA: Diagnosis not present

## 2020-11-07 NOTE — Progress Notes (Signed)
OSKAR, CRETELLA T ([id number]) . Visit Report for 11/07/2020 Arrival Information Details Patient Name: Date of Service: Devin Going 11/07/2020 4:00 PM Medical Record Number: [id number] Patient Account Number: [id number] Date of Birth/Sex: Treating RN: 04-13-1961 (60 y.o. Charlean Merl, [name] Primary Care Fatuma Dowers: [name] Other Clinician: Referring Feliciana Narayan: Treating Dainel Arcidiacono/Extender: Doran Stabler in Treatment: 7 Visit Information History Since Last Visit Added or deleted any medications: No Patient Arrived: Ambulatory Any new allergies or adverse reactions: No Arrival Time: 16:16 Had a fall or experienced change in No Accompanied By: self activities of daily living that may affect Transfer Assistance: None risk of falls: Patient Identification Verified: Yes Signs or symptoms of abuse/neglect since last visito No Secondary Verification Process Completed: Yes Hospitalized since last visit: No Patient Requires Transmission-Based Precautions: No Implantable device outside of the clinic excluding No Patient Has Alerts: Yes cellular tissue based products placed in the center Patient Alerts: R ABI: 0.96 since last visit: L ABI: 1.19 Has Dressing in Place as Prescribed: Yes Pain Present Now: No Electronic Signature(s) Signed: 11/07/2020 5:40:01 PM By: Fonnie Mu RN Entered By: Fonnie Mu on 11/07/2020 16:16:48 -------------------------------------------------------------------------------- Encounter Discharge Information Details Patient Name: Date of Service: [name], [name] 11/07/2020 4:00 PM Medical Record Number: [id number] Patient Account Number: [id number] Date of Birth/Sex: Treating RN: [birth year] (59 y.o. [name] Primary Care Barack Nicodemus: [name] Other Clinician: Referring Ketih Goodie: Treating Alizae Bechtel/Extender: Doran Stabler in Treatment: 7 Encounter Discharge Information  Items Post Procedure Vitals Discharge Condition: Stable Temperature (F): 98.7 Ambulatory Status: Ambulatory Pulse (bpm): 64 Discharge Destination: Home Respiratory Rate (breaths/min): 17 Transportation: Private Auto Blood Pressure (mmHg): 177/73 Accompanied By: self Schedule Follow-up Appointment: Yes Clinical Summary of Care: Electronic Signature(s) Signed: 11/07/2020 6:12:14 PM By: Shawn Stall Entered By: Shawn Stall on 11/07/2020 18:11:05 -------------------------------------------------------------------------------- Lower Extremity Assessment Details Patient Name: Date of Service: Devin Going 11/07/2020 4:00 PM Medical Record Number: [id number] Patient Account Number: [id number] Date of Birth/Sex: Treating RN: 18-Jun-1961 (59 y.o. Charlean Merl, [name] Primary Care Laquesha Holcomb: [name] Other Clinician: Referring Rilen Shukla: Treating Eshawn Coor/Extender: Doran Stabler in Treatment: 7 Edema Assessment Assessed: Kyra Searles: Yes] Franne Forts: No] Edema: [Left: N] [Right: o] Calf Left: Right: Point of Measurement: 35 cm From Medial Instep 35.5 cm Ankle Left: Right: Point of Measurement: 11 cm From Medial Instep 22 cm Vascular Assessment Pulses: Dorsalis Pedis Palpable: [Left:Yes] Posterior Tibial Palpable: [Left:Yes] Electronic Signature(s) Signed: 11/07/2020 5:40:01 PM By: Fonnie Mu RN Entered By: Fonnie Mu on 11/07/2020 16:22:07 -------------------------------------------------------------------------------- Multi Wound Chart Details Patient Name: Date of Service: [name], [name] 11/07/2020 4:00 PM Medical Record Number: [id number] Patient Account Number: [id number] Date of Birth/Sex: Treating RN: 03-13-1961 (59 y.o. Elizebeth Koller Primary Care Angella Montas: [name] Other Clinician: Referring Binnie Vonderhaar: Treating Angeles Paolucci/Extender: Doran Stabler in Treatment: 7 Vital Signs Height(in):  72 Capillary Blood Glucose(mg/dl): 876 Weight(lbs): 811 Pulse(bpm): 64 Body Mass Index(BMI): 28 Blood Pressure(mmHg): 177/73 Temperature(F): 98.7 Respiratory Rate(breaths/min): 17 Photos: [5:Left Metatarsal head fourth] [N/A:N/A N/A] Wound Location: [5:Shear/Friction] [N/A:N/A] Wounding Event: [5:Diabetic Wound/Ulcer of the Lower] [N/A:N/A] Primary Etiology: [5:Extremity Coronary Artery Disease,] [N/A:N/A] Comorbid History: [5:Hypertension, Type II Diabetes, Gout 08/29/2020] [N/A:N/A] Date Acquired: [5:7] [N/A:N/A] Weeks of Treatment: [5:Open] [N/A:N/A] Wound Status: [5:0.9x1.3x0.5] [N/A:N/A] Measurements L x W x D (cm) [5:0.919] [N/A:N/A] A (cm) : rea [5:0.459] [N/A:N/A] Volume (cm) : [5:74.00%] [N/A:N/A] %  Reduction in A [5:rea: 35.10%] [N/A:N/A] % Reduction in Volume: [5:9] Starting Position 1 (o'clock): [5:2] Ending Position 1 (o'clock): [5:0.3] Maximum Distance 1 (cm): [5:Yes] [N/A:N/A] Undermining: [5:Grade 2] [N/A:N/A] Classification: [5:Medium] [N/A:N/A] Exudate A mount: [5:Serosanguineous] [N/A:N/A] Exudate Type: [5:red, brown] [N/A:N/A] Exudate Color: [5:Well defined, not attached] [N/A:N/A] Wound Margin: [5:Large (67-100%)] [N/A:N/A] Granulation A mount: [5:Red] [N/A:N/A] Granulation Quality: [5:Small (1-33%)] [N/A:N/A] Necrotic A mount: [5:Fat Layer (Subcutaneous Tissue): Yes N/A] Exposed Structures: [5:Fascia: No Tendon: No Muscle: No Joint: No Bone: No Small (1-33%)] [N/A:N/A] Epithelialization: [5:Debridement - Excisional] [N/A:N/A] Debridement: Pre-procedure Verification/Time Out 17:15 [N/A:N/A] Taken: [5:Lidocaine Injectable] [N/A:N/A] Pain Control: [5:Bone] [N/A:N/A] Tissue Debrided: [5:Skin/Subcutaneous] [N/A:N/A] Level: [5:Tissue/Muscle/Bone 1.17] [N/A:N/A] Debridement A (sq cm): [5:rea Curette] [N/A:N/A] Instrument: [5:Swab] [N/A:N/A] Specimen: [5:1] [N/A:N/A] Number of Specimens Taken: [5:Moderate] [N/A:N/A] Bleeding: [5:Silver Nitrate]  [N/A:N/A] Hemostasis Achieved: Debridement Treatment Response: Procedure was tolerated well [N/A:N/A] Post Debridement Measurements L x 0.9x1.3x0.5 [N/A:N/A] W x D (cm) [5:0.459] [N/A:N/A] Post Debridement Volume: (cm) [5:Debridement] [N/A:N/A] Treatment Notes Electronic Signature(s) Signed: 11/07/2020 5:58:13 PM By: Baltazar Najjarobson, Michael MD Signed: 11/07/2020 6:16:37 PM By: Zandra AbtsLynch, Shatara RN, BSN Entered By: Baltazar Najjarobson, Michael on 11/07/2020 17:52:43 -------------------------------------------------------------------------------- Multi-Disciplinary Care Plan Details Patient Name: Date of Service: Kathlyn [place]GRINDSTA FF, Donnal DebarA LA N T. 11/07/2020 4:00 PM Medical Record Number: [id number] Patient Account Number: [id number] Date of Birth/Sex: Treating RN: 12/20/1960 (59 y.o. Elizebeth KollerM) Lynch, Shatara Primary Care Henchy Mccauley: Nelwyn SalisburyFry, Stephen A Other Clinician: Referring Glynis Hunsucker: Treating Tequisha Maahs/Extender: Doran Stablerobson, Michael Fry, Stephen A Weeks in Treatment: 7 Multidisciplinary Care Plan reviewed with physician Active Inactive Nutrition Nursing Diagnoses: Impaired glucose control: actual or potential Potential for alteratiion in Nutrition/Potential for imbalanced nutrition Goals: Patient/caregiver will maintain therapeutic glucose control Date Initiated: 09/14/2020 Target Resolution Date: 12/12/2020 Goal Status: Active Interventions: Assess patient nutrition upon admission and as needed per policy Provide education on elevated blood sugars and impact on wound healing Treatment Activities: Patient referred to Primary Care Physician for further nutritional evaluation : 09/14/2020 Notes: Wound/Skin Impairment Nursing Diagnoses: Impaired tissue integrity Knowledge deficit related to ulceration/compromised skin integrity Goals: Patient/caregiver will verbalize understanding of skin care regimen Date Initiated: 09/14/2020 Target Resolution Date: 12/12/2020 Goal Status: Active Ulcer/skin breakdown will have a volume  reduction of 30% by week 4 Date Initiated: 09/14/2020 Date Inactivated: 10/20/2020 Target Resolution Date: 10/12/2020 Unmet Reason: see wound Goal Status: Unmet measurements. Interventions: Assess patient/caregiver ability to obtain necessary supplies Assess patient/caregiver ability to perform ulcer/skin care regimen upon admission and as needed Assess ulceration(s) every visit Treatment Activities: Topical wound management initiated : 09/14/2020 Notes: Electronic Signature(s) Signed: 11/07/2020 5:05:34 PM By: Antonieta IbaBarnhart, Jodi Signed: 11/07/2020 6:16:37 PM By: Zandra AbtsLynch, Shatara RN, BSN Entered By: Antonieta IbaBarnhart, Jodi on 11/07/2020 17:05:33 -------------------------------------------------------------------------------- Pain Assessment Details Patient Name: Date of Service: Kathlyn [place]GR[place] FF, Donnal DebarA LA N T. 11/07/2020 4:00 PM Medical Record Number: [id number] Patient Account Number: [id number] Date of Birth/Sex: Treating RN: 12/20/1960 (59 y.o. Charlean MerlM) Breedlove, [name] Primary Care Sandrine Bloodsworth: Nelwyn SalisburyFry, Stephen A Other Clinician: Referring Wynee Matarazzo: Treating Taiyana Kissler/Extender: Doran Stablerobson, Michael Fry, Stephen A Weeks in Treatment: 7 Active Problems Location of Pain Severity and Description of Pain Patient Has Paino Yes Site Locations Pain Location: Pain Location: Pain in Ulcers With Dressing Change: Yes Duration of the Pain. Constant / Intermittento Constant Rate the pain. Current Pain Level: 5 Worst Pain Level: 10 Least Pain Level: 0 Tolerable Pain Level: 5 Character of Pain Describe the Pain: Aching Pain Management and Medication Current Pain Management: Medication: Yes Cold Application: No Rest: Yes Massage: No Activity: No T.E.N.S.: No Heat Application:  No Leg drop or elevation: No Is the Current Pain Management Adequate: Adequate How does your wound impact your activities of daily livingo Sleep: No Bathing: No Appetite: No Relationship With Others: No Bladder Continence: No Emotions:  No Bowel Continence: No Work: No Toileting: No Drive: No Dressing: No Hobbies: No Electronic Signature(s) Signed: 11/07/2020 5:40:01 PM By: Fonnie Mu RN Entered By: Fonnie Mu on 11/07/2020 16:20:30 -------------------------------------------------------------------------------- Patient/Caregiver Education Details Patient Name: Date of Service: Devin Going 3/14/2022andnbsp4:00 PM Medical Record Number: [id number] Patient Account Number: [id number] Date of Birth/Gender: Treating RN: 12-08-1960 (60 y.o. Elizebeth Koller Primary Care Physician: [name] Other Clinician: Referring Physician: Treating Physician/Extender: Doran Stabler in Treatment: 7 Education Assessment Education Provided To: Patient Education Topics Provided Elevated Blood Sugar/ Impact on Healing: Methods: Explain/Verbal, Printed Responses: State content correctly Wound/Skin Impairment: Methods: Explain/Verbal, Printed Responses: State content correctly Electronic Signature(s) Signed: 11/07/2020 5:53:54 PM By: Antonieta Iba Entered By: Antonieta Iba on 11/07/2020 17:06:05 -------------------------------------------------------------------------------- Wound Assessment Details Patient Name: Date of Service: [name], [name] 11/07/2020 4:00 PM Medical Record Number: [id number] Patient Account Number: [id number] Date of Birth/Sex: Treating RN: 1960-11-10 (59 y.o. Charlean Merl, [name] Primary Care Secret Kristensen: [name] Other Clinician: Referring Jossie Smoot: Treating Hardie Veltre/Extender: Doran Stabler in Treatment: 7 Wound Status Wound Number: 5 Primary Diabetic Wound/Ulcer of the Lower Extremity Etiology: Wound Location: Left Metatarsal head fourth Wound Status: Open Wounding Event: Shear/Friction Comorbid Coronary Artery Disease, Hypertension, Type II Diabetes, Date Acquired: 08/29/2020 History: Gout Weeks Of  Treatment: 7 Clustered Wound: No Photos Wound Measurements Length: (cm) 0.9 Width: (cm) 1.3 Depth: (cm) 0.5 Area: (cm) 0.919 Volume: (cm) 0.459 % Reduction in Area: 74% % Reduction in Volume: 35.1% Epithelialization: Small (1-33%) Tunneling: No Undermining: Yes Starting Position (o'clock): 9 Ending Position (o'clock): 2 Maximum Distance: (cm) 0.3 Wound Description Classification: Grade 2 Wound Margin: Well defined, not attached Exudate Amount: Medium Exudate Type: Serosanguineous Exudate Color: red, brown Foul Odor After Cleansing: No Slough/Fibrino Yes Wound Bed Granulation Amount: Large (67-100%) Exposed Structure Granulation Quality: Red Fascia Exposed: No Necrotic Amount: Small (1-33%) Fat Layer (Subcutaneous Tissue) Exposed: Yes Necrotic Quality: Adherent Slough Tendon Exposed: No Muscle Exposed: No Joint Exposed: No Bone Exposed: No Treatment Notes Wound #5 (Metatarsal head fourth) Wound Laterality: Left Cleanser Soap and Water Discharge Instruction: May shower and wash wound with dial antibacterial soap and water prior to dressing change. Peri-Wound Care Topical Primary Dressing KerraCel Ag Gelling Fiber Dressing, 2x2 in (silver alginate) Discharge Instruction: Apply silver alginate to wound bed as instructed Secondary Dressing Woven Gauze Sponges 2x2 in Discharge Instruction: Apply over primary dressing as directed. Optifoam Non-Adhesive Dressing, 4x4 in Discharge Instruction: Apply over primary dressing cut to make foam donut Secured With Conforming Stretch Gauze Bandage, Sterile 2x75 (in/in) Discharge Instruction: Secure with stretch gauze as directed. Paper Tape, 2x10 (in/yd) Discharge Instruction: Secure dressing with tape as directed. Compression Wrap Compression Stockings Add-Ons Electronic Signature(s) Signed: 11/07/2020 5:39:15 PM By: Karl Ito Signed: 11/07/2020 5:40:01 PM By: Fonnie Mu RN Entered By: Karl Ito on  11/07/2020 17:36:32 -------------------------------------------------------------------------------- Vitals Details Patient Name: Date of Service: [name], [name] 11/07/2020 4:00 PM Medical Record Number: [id number] Patient Account Number: [id number] Date of Birth/Sex: Treating RN: Jan 02, 1961 (59 y.o. Lucious Groves Primary Care Lois Slagel: [name] Other Clinician: Referring Porschea Borys: Treating Markayla Reichart/Extender: Doran Stabler in Treatment: 7 Vital Signs Time Taken: 16:21 Temperature (  F): 98.7 Height (in): 72 Pulse (bpm): 64 Weight (lbs): 205 Respiratory Rate (breaths/min): 17 Body Mass Index (BMI): 27.8 Blood Pressure (mmHg): 177/73 Capillary Blood Glucose (mg/dl): 892 Reference Range: 80 - 120 mg / dl Electronic Signature(s) Signed: 11/07/2020 5:40:01 PM By: Fonnie Mu RN Entered By: Fonnie Mu on 11/07/2020 16:21:19

## 2020-11-07 NOTE — Progress Notes (Signed)
Robert Robert Huang, Robert Robert Huang ([id number]) . Visit Report for 11/07/2020 Debridement Details Patient Name: Date of Service: Robert Robert Huang 11/07/2020 4:00 PM Medical Record Number: [id number] Patient Account Number: [id number] Date of Birth/Sex: Treating RN: 1961-04-02 (60 y.o. Robert Robert Huang Primary Care Provider: Laurey Robert Huang Other Clinician: Referring Provider: Treating Provider/Extender: Robert Robert Huang Kitchen in Treatment: 7 Debridement Performed for Assessment: Wound #5 Left Metatarsal head fourth Performed By: Physician Robert Robert Huang., MD Debridement Type: Debridement Severity of Tissue Pre Debridement: Bone involvement without necrosis Level of Consciousness (Pre-procedure): Awake and Alert Pre-procedure Verification/Time Out Yes - 17:15 Taken: Start Time: 17:15 Pain Control: Lidocaine Injectable : 1 Robert Huang Area Debrided (L x W): otal 0.9 (cm) x 1.3 (cm) = 1.17 (cm) Tissue and other material debrided: Viable, Non-Viable, Bone Level: Skin/Subcutaneous Tissue/Muscle/Bone Debridement Description: Excisional Instrument: Curette Specimen: Swab, Number of Specimens Robert Huang aken: 1 Bleeding: Moderate Hemostasis Achieved: Silver Nitrate End Time: 17:25 Response to Treatment: Procedure was tolerated well Level of Consciousness (Post- Awake and Alert procedure): Post Debridement Measurements of Total Wound Length: (cm) 0.9 Width: (cm) 1.3 Depth: (cm) 0.5 Volume: (cm) 0.459 Character of Wound/Ulcer Post Debridement: Stable Severity of Tissue Post Debridement: Bone involvement without necrosis Post Procedure Diagnosis Same as Pre-procedure Electronic Signature(s) Signed: 11/07/2020 5:58:13 PM By: Robert Ham MD Signed: 11/07/2020 6:16:37 PM By: Robert Hurst RN, BSN Previous Signature: 11/07/2020 5:30:13 PM Version By: Lorrin Jackson Entered By: Robert Robert Huang  17:53:12 -------------------------------------------------------------------------------- HPI Details Patient Name: Date of Service: Robert Robert Huang, A LA N Robert Huang. 11/07/2020 4:00 PM Medical Record Number: [id number] Patient Account Number: [id number] Date of Birth/Sex: Treating RN: 1960-09-28 (60 y.o. Robert Robert Huang Primary Care Provider: Laurey Robert Huang Other Clinician: Referring Provider: Treating Provider/Extender: Robert Robert Huang Kitchen in Treatment: 7 History of Present Illness HPI Description: ADMISSION 05/27/2020 This is a 60 year old man who has type 2 diabetes who is here for wounds on his left foot in 2 locations. The patient has had problems with foot ulcers requiring surgery including a right great toe amputation and a left fifth ray amputation in July 2020 for underlying osteomyelitis although strangely I cannot see any pathology or cultures done in this area. He states that the area on his left fourth met head plantar aspect opened about a month ago. He has had continuous callus in this area. He saw Dr. Sarajane Jews his primary doctor at the beginning of September and was felt to have a cellulitis in the lateral part of his foot in this area he was given a shot of Rocephin and then ongoing antibiotics with Keflex for 10 days. It would appear that he was supposed to follow-up with Dr. Sarajane Jews although I do not see these notes. About a week and a half ago he stubbed his left first toe and has a wound on this area. He is using Neosporin to both wound areas. 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 Sarajane Jews ABI in our clinic was 1.23 on the left 10/8; the patient's area over the left fourth  metatarsal head is fully closed. This was a split callus last week that I debrided. He still has an open area over the  tip of his left great toe. He is using a forefoot offloading and silver alginate. We made him an appointment with [facility] to see about custom made diabetic shoes with inserts this appointment is for next Tuesday. Readmission: 09/14/2020 upon evaluation today patient appears to have several wounds over his left dorsal great toe, left fourth metatarsal head, and right second toe. The good news is none of these appear to be severe at this point. Nonetheless unfortunately he is going require a little bit of the debridement today to try to clear away some of the necrotic debris currently. Specifically the left metatarsal head of the fourth metatarsal does look particularly is a wound that is good to require sharp debridement. The patient does have a history of diabetes mellitus type 2, hypertension, and diabetic neuropathy. He has previously been seen here in the clinic and I am not certain that he is ever really have this completely healed although we have not seen him since October 2020. Fortunately his ABIs appear to be doing excellent today. 1/27; patient was readmitted to the clinic last week. He had an area over his right second toe which is healed also of the left dorsal great toe is also healed. He attributes these wounds to a hunting trip he took with boots that perhaps were not helpful on his feet. He still has the area on the fourth met head status post left fifth ray amputation in July 20 2/3; left fourth metatarsal head. Wound is about the same size as last week some debris around the circumference of the wound including callus and dry skin I removed this knocked down the circumference hopefully to make the surrounding tissue a little less senescent. Still using silver collagen. I talked to him about a total contact cast 2/17; 2-week follow-up. Left fourth  metatarsal head. He is status post fifth ray amputation in July 2020. I thought we would be able to put a total contact cast on him however he arrives with a dramatic worsening of the wound with increased depth to bone as well as undermining from 6-3 o'clock. Scant amount of purulent drainage which I have cultured. This deterioration this week would obviously preclude a total contact cast. 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 He has a forefoot offloading boot 2/24; culture of this wound that I did last time after registering a fairly dramatic deterioration with exposed bone showed MRSA and Enterococcus faecalis. After some difficulty with insurance and cost of linezolid he is taking that now. Robert Huang olerating it well. He will definitely need an MRI of the foot and I have ordered this. Using silver alginate on the wound 10/31/2020 upon evaluation today patient appears to be doing well with regard to his wound. Fortunately there is no signs of active infection at this time. In fact I feel like that the the linezolid did a great job for him. Overall I am very pleased in that regard. 3/14; I have not seen this patient in 3 weeks. The purulent drainage I cultured showed MRSA and Enterococcus on 2/17. We gave him line escalated but only for a week this was not renewed last week. I have reviewed his MRI of the foot that did not show osteomyelitis nevertheless he has a wide swath of bone which is his fourth metatarsal head. Using Iodoflex but I change this  to silver alginate Electronic Signature(s) Signed: 11/07/2020 5:58:13 PM By: Robert Ham MD Entered By: Robert Robert Huang 17:54:40 -------------------------------------------------------------------------------- Physical Exam Details Patient Name: Date of Service: Robert Robert Huang, Robert Robert Huang. 11/07/2020 4:00 PM Medical Record Number:  [id number] Patient Account Number: [id number] Date of Birth/Sex: Treating RN: 02/09/1961 (60 y.o. Robert Robert Huang Primary Care Provider: Laurey Robert Huang Other Clinician: Referring Provider: Treating Provider/Extender: Robert Robert Huang Kitchen in Treatment: 7 Constitutional Patient is hypertensive.. Pulse regular and within target range for patient.Robert Robert Huang Kitchen Respirations regular, non-labored and within target range.. Temperature is normal and within the target range for the patient.Robert Robert Huang Kitchen Appears in no distress. Notes Wound exam; this wound goes right down to bone. Comment last week was good granulation however in the center part this goes right to bone. I used a #3 curette to do a bone scraping for culture Electronic Signature(s) Signed: 11/07/2020 5:58:13 PM By: Robert Ham MD Entered By: Robert Robert Huang 17:55:37 -------------------------------------------------------------------------------- Physician Orders Details Patient Name: Date of Service: Robert Robert Huang, Robert Robert Huang. 11/07/2020 4:00 PM Medical Record Number: [id number] Patient Account Number: [id number] Date of Birth/Sex: Treating RN: 1961/08/14 (60 y.o. Robert Robert Huang Primary Care Provider: Laurey Robert Huang Other Clinician: Referring Provider: Treating Provider/Extender: Robert Robert Huang Kitchen in Treatment: 7 Verbal / Phone Orders: No Diagnosis Coding Follow-up Appointments Return Appointment in 1 week. Bathing/ Shower/ Hygiene May shower and wash wound with soap and water. Off-Loading Wedge shoe to: - left foot to ambulate Non Wound Condition Protect area with: - pad with foam or gauze to right 2nd toe. Wound Treatment Wound #5 - Metatarsal head fourth Wound Laterality: Left Cleanser: Soap and Water Every Other Day/7 Days Discharge Instructions: May shower and wash wound with dial antibacterial soap and water prior to dressing change. Prim Dressing: KerraCel Ag Gelling Fiber Dressing, 2x2 in  (silver alginate) Every Other Day/7 Days ary Discharge Instructions: Apply silver alginate to wound bed as instructed Secondary Dressing: Woven Gauze Sponges 2x2 in Every Other Day/7 Days Discharge Instructions: Apply over primary dressing as directed. Secondary Dressing: Optifoam Non-Adhesive Dressing, 4x4 in Every Other Day/7 Days Discharge Instructions: Apply over primary dressing cut to make foam donut Secured With: Conforming Stretch Gauze Bandage, Sterile 2x75 (in/in) Every Other Day/7 Days Discharge Instructions: Secure with stretch gauze as directed. Secured With: Paper Tape, 2x10 (in/yd) Every Other Day/7 Days Discharge Instructions: Secure dressing with tape as directed. Laboratory naerobe culture (MICRO) - Bone scraping, Left 4th met head ICD: E11.621 Bacteria identified in Unspecified specimen by A LOINC Code: 466-5 Convenience Name: Anerobic culture Electronic Signature(s) Signed: 11/07/2020 5:58:13 PM By: Robert Ham MD Signed: 11/07/2020 6:16:37 PM By: Robert Hurst RN, BSN Previous Signature: 11/07/2020 5:05:15 PM Version By: Lorrin Jackson Entered By: Robert Robert Huang on Robert Huang 17:28:20 -------------------------------------------------------------------------------- Problem List Details Patient Name: Date of Service: Robert Robert Huang, Robert Robert Huang. 11/07/2020 4:00 PM Medical Record Number: [id number] Patient Account Number: [id number] Date of Birth/Sex: Treating RN: Sep 26, 1960 (60 y.o. Robert Robert Huang Primary Care Provider: Laurey Robert Huang Other Clinician: Referring Provider: Treating Provider/Extender: Robert Robert Huang Kitchen in Treatment: 7 Active Problems ICD-10 Encounter Code Description Active Date MDM Diagnosis E11.621 Type 2 diabetes mellitus with foot ulcer 09/14/2020 No Yes L97.522 Non-pressure chronic ulcer of other part of left foot with fat layer exposed 09/14/2020 No Yes E11.40 Type 2 diabetes mellitus with diabetic neuropathy, unspecified  09/14/2020 No Yes I10 Essential (primary) hypertension 09/14/2020 No Yes L03.116 Cellulitis  of left lower limb 10/20/2020 No Yes Inactive Problems ICD-10 Code Description Active Date Inactive Date L97.512 Non-pressure chronic ulcer of other part of right foot with fat layer exposed 09/14/2020 09/14/2020 Resolved Problems Electronic Signature(s) Signed: 11/07/2020 5:58:13 PM By: Robert Ham MD Entered By: Robert Robert Huang 17:52:35 -------------------------------------------------------------------------------- Progress Note Details Patient Name: Date of Service: Robert Robert Huang, Robert Robert Huang. 11/07/2020 4:00 PM Medical Record Number: [id number] Patient Account Number: [id number] Date of Birth/Sex: Treating RN: 24-Mar-1961 (59 y.o. Robert Robert Huang Primary Care Provider: Laurey Robert Huang Other Clinician: Referring Provider: Treating Provider/Extender: Robert Robert Huang Kitchen in Treatment: 7 Subjective History of Present Illness (HPI) ADMISSION 05/27/2020 This is a 60 year old man who has type 2 diabetes who is here for wounds on his left foot in 2 locations. The patient has had problems with foot ulcers requiring surgery including a right great toe amputation and a left fifth ray amputation in July 2020 for underlying osteomyelitis although strangely I cannot see any pathology or cultures done in this area. He states that the area on his left fourth met head plantar aspect opened about a month ago. He has had continuous callus in this area. He saw Dr. Sarajane Jews his primary doctor at the beginning of September and was felt to have a cellulitis in the lateral part of his foot in this area he was given a shot of Rocephin and then ongoing antibiotics with Keflex for 10 days. It would appear that he was supposed to follow-up with Dr. Sarajane Jews although I do not see these notes. About a week and a half ago he stubbed his left first toe and has a wound on this area. He is using Neosporin to  both wound areas. 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 Sarajane Jews ABI in our clinic was 1.23 on the left 10/8; the patient's area over the left fourth metatarsal head is fully closed. This was a split callus last week that I debrided. He still has an open area over the tip of his left great toe. He is using a forefoot offloading and silver alginate. We made him an appointment with [facility] to see about custom made diabetic shoes with inserts this appointment is for next Tuesday. Readmission: 09/14/2020 upon evaluation today patient appears to have several wounds over his left dorsal great toe, left fourth metatarsal head, and right second toe. The good news is none of these appear to be severe at this point. Nonetheless unfortunately he is going require a little bit of the debridement today to try to clear away some of the necrotic debris currently. Specifically the left metatarsal head of the fourth metatarsal does look particularly is a wound that is good to require sharp debridement. The patient does have a history of diabetes mellitus type 2, hypertension, and diabetic neuropathy. He has previously been seen here in the clinic and I am not certain that he is ever really have this completely healed although we have not seen him since October 2020. Fortunately his ABIs appear to be doing excellent today. 1/27; patient was readmitted to the clinic last week. He had an area over his right second toe which is healed also  of the left dorsal great toe is also healed. He attributes these wounds to a hunting trip he took with boots that perhaps were not helpful on his feet. He still has the area on the  fourth met head status post left fifth ray amputation in July 20 2/3; left fourth metatarsal head. Wound is about the same size as last week some debris around the circumference of the wound including callus and dry skin I removed this knocked down the circumference hopefully to make the surrounding tissue a little less senescent. Still using silver collagen. I talked to him about a total contact cast 2/17; 2-week follow-up. Left fourth metatarsal head. He is status post fifth ray amputation in July 2020. I thought we would be able to put a total contact cast on him however he arrives with a dramatic worsening of the wound with increased depth to bone as well as undermining from 6-3 o'clock. Scant amount of purulent drainage which I have cultured. This deterioration this week would obviously preclude a total contact cast. 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 He has a forefoot offloading boot 2/24; culture of this wound that I did last time after registering a fairly dramatic deterioration with exposed bone showed MRSA and Enterococcus faecalis. After some difficulty with insurance and cost of linezolid he is taking that now. Robert Huang olerating it well. He will definitely need an MRI of the foot and I have ordered this. Using silver alginate on the wound 10/31/2020 upon evaluation today patient appears to be doing well with regard to his wound. Fortunately there is no signs of active infection at this time. In fact I feel like that the the linezolid did a great job for him. Overall I am very pleased in that regard. 3/14; I have not seen this patient in 3 weeks. The purulent drainage I cultured showed MRSA and Enterococcus on 2/17. We gave him line escalated but only for a week this was not renewed last week. I have reviewed his MRI of the foot that did not show osteomyelitis nevertheless he has a  wide swath of bone which is his fourth metatarsal head. Using Iodoflex but I change this to silver alginate Objective Constitutional Patient is hypertensive.. Pulse regular and within target range for patient.Robert Robert Huang Kitchen Respirations regular, non-labored and within target range.. Temperature is normal and within the target range for the patient.Robert Robert Huang Kitchen Appears in no distress. Vitals Time Taken: 4:21 PM, Height: 72 in, Weight: 205 lbs, BMI: 27.8, Temperature: 98.7 F, Pulse: 64 bpm, Respiratory Rate: 17 breaths/min, Blood Pressure: 177/73 mmHg, Capillary Blood Glucose: 155 mg/dl. General Notes: Wound exam; this wound goes right down to bone. Comment last week was good granulation however in the center part this goes right to bone. I used a #3 curette to do a bone scraping for culture Integumentary (Hair, Skin) Wound #5 status is Open. Original cause of wound was Shear/Friction. The date acquired was: 08/29/2020. The wound has been in treatment 7 weeks. The wound is located on the Left Metatarsal head fourth. The wound measures 0.9cm length x 1.3cm width x 0.5cm depth; 0.919cm^2 area and 0.459cm^3 volume. There is Fat Layer (Subcutaneous Tissue) exposed. There is no tunneling noted, however, there is undermining starting at 9:00 and ending at 2:00 with a maximum distance of 0.3cm. There is a medium amount of serosanguineous drainage noted. The wound  margin is well defined and not attached to the wound base. There is large (67-100%) red granulation within the wound bed. There is a small (1-33%) amount of necrotic tissue within the wound bed including Adherent Slough. Assessment Active Problems ICD-10 Type 2 diabetes mellitus with foot ulcer Non-pressure chronic ulcer of other part of left foot with fat layer exposed Type 2 diabetes mellitus with diabetic neuropathy, unspecified Essential (primary) hypertension Cellulitis of left lower limb Procedures Wound #5 Pre-procedure diagnosis of Wound #5 is a Diabetic  Wound/Ulcer of the Lower Extremity located on the Left Metatarsal head fourth .Severity of Tissue Pre Debridement is: Bone involvement without necrosis. There was a Excisional Skin/Subcutaneous Tissue/Muscle/Bone Debridement with a total area of 1.17 sq cm performed by Robert Robert Huang., MD. With the following instrument(s): Curette to remove Viable and Non-Viable tissue/material. Material removed includes Bone after achieving pain control using Lidocaine Injectable: 1%. 1 specimen was taken by a Swab and sent to the lab per facility protocol. A time out was conducted at 17:15, prior to the start of the procedure. A Moderate amount of bleeding was controlled with Silver Nitrate. The procedure was tolerated well. Post Debridement Measurements: 0.9cm length x 1.3cm width x 0.5cm depth; 0.459cm^3 volume. Character of Wound/Ulcer Post Debridement is stable. Severity of Tissue Post Debridement is: Bone involvement without necrosis. Post procedure Diagnosis Wound #5: Same as Pre-Procedure Plan Follow-up Appointments: Return Appointment in 1 week. Bathing/ Shower/ Hygiene: May shower and wash wound with soap and water. Off-Loading: Wedge shoe to: - left foot to ambulate Non Wound Condition: Protect area with: - pad with foam or gauze to right 2nd toe. Laboratory ordered were: Anerobic culture - Bone scraping, Left 4th met head ICD: E11.621 WOUND #5: - Metatarsal head fourth Wound Laterality: Left Cleanser: Soap and Water Every Other Day/7 Days Discharge Instructions: May shower and wash wound with dial antibacterial soap and water prior to dressing change. Prim Dressing: KerraCel Ag Gelling Fiber Dressing, 2x2 in (silver alginate) Every Other Day/7 Days ary Discharge Instructions: Apply silver alginate to wound bed as instructed Secondary Dressing: Woven Gauze Sponges 2x2 in Every Other Day/7 Days Discharge Instructions: Apply over primary dressing as directed. Secondary Dressing: Optifoam  Non-Adhesive Dressing, 4x4 in Every Other Day/7 Days Discharge Instructions: Apply over primary dressing cut to make foam donut Secured With: Conforming Stretch Gauze Bandage, Sterile 2x75 (in/in) Every Other Day/7 Days Discharge Instructions: Secure with stretch gauze as directed. Secured With: Paper Robert Huang ape, 2x10 (in/yd) Every Other Day/7 Days Discharge Instructions: Secure dressing with tape as directed. 1. Await bone scraping for CandS. In spite of the MRI which did not show osteomyelitis in the area of the fifth metatarsal head I think there is ongoing infection in this wound area. He is going to need a prolonged course of antibiotic 2. Change the dressing to silver alginate this week 3. He asked me if this was ever going to heal. I think he is already had experience of a ray amputation on the left done in 2020 by a Dr. Stann Mainland at [facility] and an amputation on the right. I will discuss this with him next time. He does not appear to have an arterial issue Electronic Signature(s) Signed: 11/07/2020 5:58:13 PM By: Robert Ham MD Entered By: Robert Robert Huang 17:57:25 -------------------------------------------------------------------------------- SuperBill Details Patient Name: Date of Service: Robert Robert Huang, Robert Robert Huang. 11/07/2020 Medical Record Number: [id number] Patient Account Number: [id number] Date of Birth/Sex: Treating RN: Jan 25, 1961 (60 y.o. Robert Robert Huang Primary  Care Provider: Laurey Robert Huang Other Clinician: Referring Provider: Treating Provider/Extender: Robert Robert Huang Kitchen in Treatment: 7 Diagnosis Coding ICD-10 Codes Code Description E11.621 Type 2 diabetes mellitus with foot ulcer L97.522 Non-pressure chronic ulcer of other part of left foot with fat layer exposed L97.512 Non-pressure chronic ulcer of other part of right foot with fat layer exposed E11.40 Type 2 diabetes mellitus with diabetic neuropathy, unspecified I10 Essential  (primary) hypertension L03.116 Cellulitis of left lower limb Facility Procedures CPT4 Code: [id number] Description: 82883 - DEB BONE 20 SQ CM/< ICD-10 Diagnosis Description L97.522 Non-pressure chronic ulcer of other part of left foot with fat layer expos E11.621 Type 2 diabetes mellitus with foot ulcer Modifier: ed Quantity: 1 Physician Procedures CPT4: Description Modifier Code [id number] Debridement; bone (includes epidermis, dermis, subQ tissue, muscle and/or fascia, if performed) 1st 20 sqcm or less ICD-10 Diagnosis Description L97.522 Non-pressure chronic ulcer of other part of left foot with  fat layer exposed E11.621 Type 2 diabetes mellitus with foot ulcer Quantity: 1 Electronic Signature(s) Signed: 11/07/2020 5:58:13 PM By: Robert Ham MD Previous Signature: 11/07/2020 5:30:43 PM Version By: Lorrin Jackson Entered By: Robert Robert Huang 17:57:36

## 2020-11-12 LAB — AEROBIC/ANAEROBIC CULTURE W GRAM STAIN (SURGICAL/DEEP WOUND)
Culture: NO GROWTH
Gram Stain: NONE SEEN

## 2020-11-15 ENCOUNTER — Encounter (HOSPITAL_BASED_OUTPATIENT_CLINIC_OR_DEPARTMENT_OTHER): Payer: 59 | Admitting: Internal Medicine

## 2020-11-15 ENCOUNTER — Other Ambulatory Visit: Payer: Self-pay

## 2020-11-15 DIAGNOSIS — L97524 Non-pressure chronic ulcer of other part of left foot with necrosis of bone: Secondary | ICD-10-CM | POA: Diagnosis not present

## 2020-11-15 DIAGNOSIS — E1140 Type 2 diabetes mellitus with diabetic neuropathy, unspecified: Secondary | ICD-10-CM | POA: Diagnosis not present

## 2020-11-15 DIAGNOSIS — Z955 Presence of coronary angioplasty implant and graft: Secondary | ICD-10-CM | POA: Diagnosis not present

## 2020-11-15 DIAGNOSIS — E11621 Type 2 diabetes mellitus with foot ulcer: Secondary | ICD-10-CM | POA: Diagnosis not present

## 2020-11-15 DIAGNOSIS — L03116 Cellulitis of left lower limb: Secondary | ICD-10-CM | POA: Diagnosis not present

## 2020-11-15 DIAGNOSIS — I10 Essential (primary) hypertension: Secondary | ICD-10-CM | POA: Diagnosis not present

## 2020-11-15 DIAGNOSIS — L97512 Non-pressure chronic ulcer of other part of right foot with fat layer exposed: Secondary | ICD-10-CM | POA: Diagnosis not present

## 2020-11-15 DIAGNOSIS — I2510 Atherosclerotic heart disease of native coronary artery without angina pectoris: Secondary | ICD-10-CM | POA: Diagnosis not present

## 2020-11-15 DIAGNOSIS — L97522 Non-pressure chronic ulcer of other part of left foot with fat layer exposed: Secondary | ICD-10-CM | POA: Diagnosis not present

## 2020-11-15 NOTE — Progress Notes (Signed)
BOSTEN, NEWSTROM T ([id number]) . Visit Report for 11/15/2020 HPI Details Patient Name: Date of Service: Robert Huang 11/15/2020 8:45 A M Medical Record Number: [id number] Patient Account Number: [id number] Date of Birth/Sex: Treating RN: April 16, 1961 (60 y.o. Robert Huang, Robert Huang Primary Care Provider: Laurey Huang Other Clinician: Referring Provider: Treating Provider/Extender: Robert Huang Kitchen in Treatment: 8 History of Present Illness HPI Description: ADMISSION 05/27/2020 This is a 60 year old man who has type 2 diabetes who is here for wounds on his left foot in 2 locations. The patient has had problems with foot ulcers requiring surgery including a right great toe amputation and a left fifth ray amputation in July 2020 for underlying osteomyelitis although strangely I cannot see any pathology or cultures done in this area. He states that the area on his left fourth met head plantar aspect opened about a month ago. He has had continuous callus in this area. He saw Dr. Sarajane Huang his primary doctor at the beginning of September and was felt to have a cellulitis in the lateral part of his foot in this area he was given a shot of Rocephin and then ongoing antibiotics with Keflex for 10 days. It would appear that he was supposed to follow-up with Dr. Sarajane Huang although I do not see these notes. About a week and a half ago he stubbed his left first toe and has a wound on this area. He is using Neosporin to both wound areas. 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 Sarajane Huang ABI in our clinic was 1.23 on the left 10/8; the patient's area over the left fourth metatarsal head is fully closed.  This was a split callus last week that I debrided. He still has an open area over the tip of his left great toe. He is using a forefoot offloading and silver alginate. We made him an appointment with [facility] to see about custom made diabetic shoes with inserts this appointment is for next Tuesday. Readmission: 09/14/2020 upon evaluation today patient appears to have several wounds over his left dorsal great toe, left fourth metatarsal head, and right second toe. The good news is none of these appear to be severe at this point. Nonetheless unfortunately he is going require a little bit of the debridement today to try to clear away some of the necrotic debris currently. Specifically the left metatarsal head of the fourth metatarsal does look particularly is a wound that is good to require sharp debridement. The patient does have a history of diabetes mellitus type 2, hypertension, and diabetic neuropathy. He has previously been seen here in the clinic and I am not certain that he is ever really have this completely healed although we have not seen him since October 2020. Fortunately his ABIs appear to be doing excellent today. 1/27; patient was readmitted to the clinic last week. He had an area over his right second toe which is healed also of the left dorsal great toe is also healed. He attributes these wounds to a hunting trip he took with boots that perhaps were not helpful on his feet. He still has the area on the fourth met head status post left fifth  ray amputation in July 20 2/3; left fourth metatarsal head. Wound is about the same size as last week some debris around the circumference of the wound including callus and dry skin I removed this knocked down the circumference hopefully to make the surrounding tissue a little less senescent. Still using silver collagen. I talked to him about a total contact cast 2/17; 2-week follow-up. Left fourth metatarsal head. He is status post fifth  ray amputation in July 2020. I thought we would be able to put a total contact cast on him however he arrives with a dramatic worsening of the wound with increased depth to bone as well as undermining from 6-3 o'clock. Scant amount of purulent drainage which I have cultured. This deterioration this week would obviously preclude a total contact cast. 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 He has a forefoot offloading boot 2/24; culture of this wound that I did last time after registering a fairly dramatic deterioration with exposed bone showed MRSA and Enterococcus faecalis. After some difficulty with insurance and cost of linezolid he is taking that now. T olerating it well. He will definitely need an MRI of the foot and I have ordered this. Using silver alginate on the wound 10/31/2020 upon evaluation today patient appears to be doing well with regard to his wound. Fortunately there is no signs of active infection at this time. In fact I feel like that the the linezolid did a great job for him. Overall I am very pleased in that regard. 3/14; I have not seen this patient in 3 weeks. The purulent drainage I cultured showed MRSA and Enterococcus on 2/17. We gave him line escalated but only for a week this was not renewed last week. I have reviewed his MRI of the foot that did not show osteomyelitis nevertheless he has a wide swath of bone which is his fourth metatarsal head. Using Iodoflex but I change this to silver alginate 3/22; patient has completed antibiotics. Culture I did last week which was a bone scraping with negative. His previous MRI did not show osteomyelitis myelitis. Nevertheless he has a wide swath of bone over the fourth metatarsal head. He had a dramatic deterioration in this wound in late February with cultures showing MRSA and Enterococcus we gave him line escalated he is  responded well. Electronic Signature(s) Signed: 11/15/2020 5:50:43 PM By: Linton Ham MD Entered By: Linton Ham on 11/15/2020 09:51:35 -------------------------------------------------------------------------------- Physical Exam Details Patient Name: Date of Service: [name], [name] 11/15/2020 8:45 A M Medical Record Number: [id number] Patient Account Number: [id number] Date of Birth/Sex: Treating RN: Feb 14, 1961 (60 y.o. Erie Noe Primary Care Provider: Laurey Huang Other Clinician: Referring Provider: Treating Provider/Extender: Robert Huang Kitchen in Treatment: 8 Constitutional Patient is hypertensive.. Pulse regular and within target range for patient.Robert Huang Kitchen Respirations regular, non-labored and within target range.. Temperature is normal and within the target range for the patient.Robert Huang Kitchen Appears in no distress. Notes Wound exam; the wound has a healthy looking base although a rim proximally goes straight to bone. There is no surrounding erythema no purulence. Bone scraping I did last week was negative. Electronic Signature(s) Signed: 11/15/2020 5:50:43 PM By: Linton Ham MD Entered By: Linton Ham on 11/15/2020 09:52:22 -------------------------------------------------------------------------------- Physician Orders Details Patient Name: Date of Service: [name], [name] 11/15/2020 8:45 A  M Medical Record Number: [id number] Patient Account Number: [id number] Date of Birth/Sex: Treating RN: [birth year] (60 y.o. Robert Huang, Robert Huang Primary Care Provider: Laurey Huang Other Clinician: Referring Provider: Treating Provider/Extender: Robert Huang Kitchen in Treatment: 8 Verbal / Phone Orders: No Diagnosis Coding Follow-up Appointments ppointment in 1 week. - next Friday Return A ppointment in: - on Friday 1st TCC change!!:) Return A Bathing/ Shower/ Hygiene May shower and wash wound with soap and water. Edema  Control - Lymphedema / SCD / Other Elevate legs to the level of the heart or above for 30 minutes daily and/or when sitting, a frequency of: Avoid standing for long periods of time. Off-Loading Total Contact Cast to Left Lower Extremity Non Wound Condition Protect area with: - pad with foam or gauze to right 2nd toe. Wound Treatment Wound #5 - Metatarsal head fourth Wound Laterality: Left Cleanser: Soap and Water Every Other Day/7 Days Discharge Instructions: May shower and wash wound with dial antibacterial soap and water prior to dressing change. Prim Dressing: Promogran Prisma Matrix, 4.34 (sq in) (silver collagen) Every Other Day/7 Days ary Discharge Instructions: Moisten collagen with saline or hydrogel Secondary Dressing: Woven Gauze Sponges 2x2 in Every Other Day/7 Days Discharge Instructions: Apply over primary dressing as directed. Secondary Dressing: Optifoam Non-Adhesive Dressing, 4x4 in Every Other Day/7 Days Discharge Instructions: Apply over primary dressing cut to make foam donut Secured With: Conforming Stretch Gauze Bandage, Sterile 2x75 (in/in) Every Other Day/7 Days Discharge Instructions: Secure with stretch gauze as directed. Secured With: Paper Tape, 2x10 (in/yd) Every Other Day/7 Days Discharge Instructions: Secure dressing with tape as directed. Electronic Signature(s) Signed: 11/15/2020 5:23:06 PM By: Rhae Hammock RN Signed: 11/15/2020 5:50:43 PM By: Linton Ham MD Entered By: Rhae Hammock on 11/15/2020 09:39:39 -------------------------------------------------------------------------------- Problem List Details Patient Name: Date of Service: [name], [name] 11/15/2020 8:45 A M Medical Record Number: [id number] Patient Account Number: [id number] Date of Birth/Sex: Treating RN: 16-Mar-1961 (59 y.o. Robert Huang, Robert Huang Primary Care Provider: Laurey Huang Other Clinician: Referring Provider: Treating Provider/Extender: Robert Huang Kitchen in Treatment: 8 Active Problems ICD-10 Encounter Code Description Active Date MDM Diagnosis E11.621 Type 2 diabetes mellitus with foot ulcer 09/14/2020 No Yes L97.524 Non-pressure chronic ulcer of other part of left foot with necrosis of bone 11/15/2020 No Yes E11.40 Type 2 diabetes mellitus with diabetic neuropathy, unspecified 09/14/2020 No Yes I10 Essential (primary) hypertension 09/14/2020 No Yes L03.116 Cellulitis of left lower limb 10/20/2020 No Yes Inactive Problems ICD-10 Code Description Active Date Inactive Date L97.512 Non-pressure chronic ulcer of other part of right foot with fat layer exposed 09/14/2020 09/14/2020 Resolved Problems Electronic Signature(s) Signed: 11/15/2020 5:50:43 PM By: Linton Ham MD Entered By: Linton Ham on 11/15/2020 09:49:41 -------------------------------------------------------------------------------- Progress Note Details Patient Name: Date of Service: [name], [name] 11/15/2020 8:45 A M Medical Record Number: [id number] Patient Account Number: [id number] Date of Birth/Sex: Treating RN: 1960-11-29 (60 y.o. Robert Huang, Robert Huang Primary Care Provider: Laurey Huang Other Clinician: Referring Provider: Treating Provider/Extender: Robert Huang Kitchen in Treatment: 8 Subjective History of Present Illness (HPI) ADMISSION 05/27/2020 This is a 60 year old man who has type 2 diabetes who is here for wounds on his left foot in 2 locations. The patient has had problems with foot ulcers requiring surgery including a right great toe amputation and a left fifth ray amputation in July 2020 for underlying osteomyelitis although strangely I cannot see any pathology or  cultures done in this area. He states that the area on his left fourth met head plantar aspect opened about a month ago. He has had continuous callus in this area. He saw Dr. Sarajane Huang his primary doctor at the beginning of September and was  felt to have a cellulitis in the lateral part of his foot in this area he was given a shot of Rocephin and then ongoing antibiotics with Keflex for 10 days. It would appear that he was supposed to follow-up with Dr. Sarajane Huang although I do not see these notes. About a week and a half ago he stubbed his left first toe and has a wound on this area. He is using Neosporin to both wound areas. 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 Sarajane Huang ABI in our clinic was 1.23 on the left 10/8; the patient's area over the left fourth metatarsal head is fully closed. This was a split callus last week that I debrided. He still has an open area over the tip of his left great toe. He is using a forefoot offloading and silver alginate. We made him an appointment with [facility] to see about custom made diabetic shoes with inserts this appointment is for next Tuesday. Readmission: 09/14/2020 upon evaluation today patient appears to have several wounds over his left dorsal great toe, left fourth metatarsal head, and right second toe. The good news is none of these appear to be severe at this point. Nonetheless unfortunately he is going require a little bit of the debridement today to try to clear away some of the necrotic debris currently. Specifically the left metatarsal head of the fourth metatarsal does look particularly is a wound that is good to require sharp debridement. The patient does have a history of diabetes mellitus type 2, hypertension, and diabetic neuropathy. He has previously been seen here in the clinic and I am not certain that he is ever really have this completely healed although we have not seen him since  October 2020. Fortunately his ABIs appear to be doing excellent today. 1/27; patient was readmitted to the clinic last week. He had an area over his right second toe which is healed also of the left dorsal great toe is also healed. He attributes these wounds to a hunting trip he took with boots that perhaps were not helpful on his feet. He still has the area on the fourth met head status post left fifth ray amputation in July 20 2/3; left fourth metatarsal head. Wound is about the same size as last week some debris around the circumference of the wound including callus and dry skin I removed this knocked down the circumference hopefully to make the surrounding tissue a little less senescent. Still using silver collagen. I talked to him about a total contact cast 2/17; 2-week follow-up. Left fourth metatarsal head. He is status post fifth ray amputation in July 2020. I thought we would be able to put a total contact cast on him however he arrives with a dramatic worsening of the wound with increased depth to bone as well as undermining from 6-3 o'clock. Scant amount of purulent drainage which I have cultured. This deterioration this week would obviously preclude  a total contact cast. 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 He has a forefoot offloading boot 2/24; culture of this wound that I did last time after registering a fairly dramatic deterioration with exposed bone showed MRSA and Enterococcus faecalis. After some difficulty with insurance and cost of linezolid he is taking that now. T olerating it well. He will definitely need an MRI of the foot and I have ordered this. Using silver alginate on the wound 10/31/2020 upon evaluation today patient appears to be doing well with regard to his wound. Fortunately there is no signs of active infection at this time. In fact I feel like that  the the linezolid did a great job for him. Overall I am very pleased in that regard. 3/14; I have not seen this patient in 3 weeks. The purulent drainage I cultured showed MRSA and Enterococcus on 2/17. We gave him line escalated but only for a week this was not renewed last week. I have reviewed his MRI of the foot that did not show osteomyelitis nevertheless he has a wide swath of bone which is his fourth metatarsal head. Using Iodoflex but I change this to silver alginate 3/22; patient has completed antibiotics. Culture I did last week which was a bone scraping with negative. His previous MRI did not show osteomyelitis myelitis. Nevertheless he has a wide swath of bone over the fourth metatarsal head. He had a dramatic deterioration in this wound in late February with cultures showing MRSA and Enterococcus we gave him line escalated he is responded well. Objective Constitutional Patient is hypertensive.. Pulse regular and within target range for patient.Robert Huang Kitchen Respirations regular, non-labored and within target range.. Temperature is normal and within the target range for the patient.Robert Huang Kitchen Appears in no distress. Vitals Time Taken: 8:43 AM, Height: 72 in, Weight: 205 lbs, BMI: 27.8, Temperature: 98.4 F, Pulse: 79 bpm, Respiratory Rate: 17 breaths/min, Blood Pressure: 171/89 mmHg. General Notes: Wound exam; the wound has a healthy looking base although a rim proximally goes straight to bone. There is no surrounding erythema no purulence. Bone scraping I did last week was negative. Integumentary (Hair, Skin) Wound #5 status is Open. Original cause of wound was Shear/Friction. The date acquired was: 08/29/2020. The wound has been in treatment 8 weeks. The wound is located on the Left Metatarsal head fourth. The wound measures 0.9cm length x 1.2cm width x 0.7cm depth; 0.848cm^2 area and 0.594cm^3 volume. There is bone and Fat Layer (Subcutaneous Tissue) exposed. There is no tunneling noted, however, there is  undermining starting at 9:00 and ending at 3:00 with a maximum distance of 1.2cm. There is a medium amount of serosanguineous drainage noted. The wound margin is well defined and not attached to the wound base. There is large (67-100%) red, pink granulation within the wound bed. There is a small (1-33%) amount of necrotic tissue within the wound bed including Adherent Slough. Assessment Active Problems ICD-10 Type 2 diabetes mellitus with foot ulcer Non-pressure chronic ulcer of other part of left foot with necrosis of bone Type 2 diabetes mellitus with diabetic neuropathy, unspecified Essential (primary) hypertension Cellulitis of left lower limb Procedures Wound #5 Pre-procedure diagnosis of Wound #5 is a Diabetic Wound/Ulcer of the Lower Extremity located on the Left Metatarsal head fourth . There was a T Contact otal Cast Procedure by Ricard Dillon., MD. Post procedure Diagnosis  Wound #5: Same as Pre-Procedure Plan Follow-up Appointments: Return Appointment in 1 week. - next Friday Return Appointment in: - on Friday 1st TCC change!!:) Bathing/ Shower/ Hygiene: May shower and wash wound with soap and water. Edema Control - Lymphedema / SCD / Other: Elevate legs to the level of the heart or above for 30 minutes daily and/or when sitting, a frequency of: Avoid standing for long periods of time. Off-Loading: T Contact Cast to Left Lower Extremity otal Non Wound Condition: Protect area with: - pad with foam or gauze to right 2nd toe. WOUND #5: - Metatarsal head fourth Wound Laterality: Left Cleanser: Soap and Water Every Other Day/7 Days Discharge Instructions: May shower and wash wound with dial antibacterial soap and water prior to dressing change. Prim Dressing: Promogran Prisma Matrix, 4.34 (sq in) (silver collagen) Every Other Day/7 Days ary Discharge Instructions: Moisten collagen with saline or hydrogel Secondary Dressing: Woven Gauze Sponges 2x2 in Every Other Day/7  Days Discharge Instructions: Apply over primary dressing as directed. Secondary Dressing: Optifoam Non-Adhesive Dressing, 4x4 in Every Other Day/7 Days Discharge Instructions: Apply over primary dressing cut to make foam donut Secured With: Conforming Stretch Gauze Bandage, Sterile 2x75 (in/in) Every Other Day/7 Days Discharge Instructions: Secure with stretch gauze as directed. Secured With: Paper T ape, 2x10 (in/yd) Every Other Day/7 Days Discharge Instructions: Secure dressing with tape as directed. 1. I change the dressing to silver collagen 2. I could not prove there was active osteomyelitis here. No clinical evidence of active infection. I am therefore going to go ahead and offload this aggressively and see if we can get some granulation. 3. May need to consider an advanced treatment option under the cast Electronic Signature(s) Signed: 11/15/2020 5:50:43 PM By: Linton Ham MD Entered By: Linton Ham on 11/15/2020 09:53:21 -------------------------------------------------------------------------------- Total Contact Cast Details Patient Name: Date of Service: Pia Mau T. 11/15/2020 8:45 A M Medical Record Number: [id number] Patient Account Number: [id number] Date of Birth/Sex: Treating RN: 09-Oct-1960 (59 y.o. Robert Huang, Robert Huang Primary Care Provider: Laurey Huang Other Clinician: Referring Provider: Treating Provider/Extender: Robert Huang Kitchen in Treatment: 8 T Contact Cast Applied for Wound Assessment: otal Wound #5 Left Metatarsal head fourth Performed By: Physician Ricard Dillon., MD Post Procedure Diagnosis Same as Pre-procedure Electronic Signature(s) Signed: 11/15/2020 5:50:43 PM By: Linton Ham MD Entered By: Linton Ham on 11/15/2020 09:50:33 -------------------------------------------------------------------------------- SuperBill Details Patient Name: Date of Service: Robert Huang 11/15/2020 Medical  Record Number: [id number] Patient Account Number: [id number] Date of Birth/Sex: Treating RN: 1961/02/23 (60 y.o. Robert Huang, Robert Huang Primary Care Provider: Laurey Huang Other Clinician: Referring Provider: Treating Provider/Extender: Robert Huang Kitchen in Treatment: 8 Diagnosis Coding ICD-10 Codes Code Description [phone] Type 2 diabetes mellitus with foot ulcer L97.522 Non-pressure chronic ulcer of other part of left foot with fat layer exposed L97.512 Non-pressure chronic ulcer of other part of right foot with fat layer exposed E11.40 Type 2 diabetes mellitus with diabetic neuropathy, unspecified I10 Essential (primary) hypertension L03.116 Cellulitis of left lower limb Facility Procedures CPT4 Code: [id number] Description: [phone] - APPLY TOTAL CONTACT LEG CAST ICD-10 Diagnosis Description L97.522 Non-pressure chronic ulcer of other part of left foot with fat layer exposed Modifier: Quantity: 1 Physician Procedures Electronic Signature(s) Signed: 11/15/2020 5:50:43 PM By: Linton Ham MD Entered By: Linton Ham on 11/15/2020 09:54:15

## 2020-11-16 NOTE — Progress Notes (Signed)
Robert Huang ([id number]) . Visit Report for 11/15/2020 Arrival Information Details Patient Name: Date of Service: Robert Huang 11/15/2020 8:45 A M Medical Record Number: [id number] Patient Account Number: [id number] Date of Birth/Sex: Treating RN: [birth year] (60 y.o. Robert Huang Primary Care Robert Huang: Robert Huang Other Clinician: Referring Robert Huang: Treating Robert Huang/Extender: Robert Huang in Treatment: 8 Visit Information History Since Last Visit Added or deleted any medications: No Patient Arrived: Ambulatory Any new allergies or adverse reactions: No Arrival Time: 08:42 Had a fall or experienced change in No Accompanied By: self activities of daily living that may affect Transfer Assistance: None risk of falls: Patient Identification Verified: Yes Signs or symptoms of abuse/neglect since last visito No Secondary Verification Process Completed: Yes Hospitalized since last visit: No Patient Requires Transmission-Based Precautions: No Implantable device outside of the clinic excluding No Patient Has Alerts: Yes cellular tissue based products placed in the center Patient Alerts: R ABI: 0.96 since last visit: L ABI: 1.19 Has Dressing in Place as Prescribed: Yes Pain Present Now: No Electronic Signature(s) Signed: 11/15/2020 12:58:57 PM By: Karl Ito Entered By: Karl Ito on 11/15/2020 08:43:08 -------------------------------------------------------------------------------- Encounter Discharge Information Details Patient Name: Date of Service: Robert [place], Donnal Debar Huang. 11/15/2020 8:45 A M Medical Record Number: [id number] Patient Account Number: [id number] Date of Birth/Sex: Treating RN: 07-23-1961 (60 y.o. Robert Huang Primary Care Keandria Berrocal: Robert Huang Other Clinician: Referring Robert Huang: Treating Robert Huang/Extender: Robert Huang in Treatment: 8 Encounter Discharge Information  Items Discharge Condition: Stable Ambulatory Status: Ambulatory Discharge Destination: Home Transportation: Private Auto Schedule Follow-up Appointment: Yes Clinical Summary of Care: Provided on 11/15/2020 Form Type Recipient Paper Patient Patient Electronic Signature(s) Signed: 11/15/2020 10:09:54 AM By: Antonieta Iba Entered By: Antonieta Iba on 11/15/2020 10:09:54 -------------------------------------------------------------------------------- Lower Extremity Assessment Details Patient Name: Date of Service: Robert Huang 11/15/2020 8:45 A M Medical Record Number: [id number] Patient Account Number: [id number] Date of Birth/Sex: Treating RN: [birth year] (60 y.o. Robert Huang Primary Care Halona Amstutz: Robert Huang Other Clinician: Referring Robert Huang: Treating Robert Huang/Extender: Robert Huang in Treatment: 8 Edema Assessment Assessed: Robert Huang: No] Robert Huang: No] Edema: [Left: N] [Right: o] Calf Left: Right: Point of Measurement: 35 cm From Medial Instep 35.5 cm Ankle Left: Right: Point of Measurement: 11 cm From Medial Instep 22 cm Vascular Assessment Pulses: Dorsalis Pedis Palpable: [Left:Yes] Electronic Signature(s) Signed: 11/15/2020 5:27:17 PM By: [name] RN, BSN Entered By: [name] on 11/15/2020 08:53:35 -------------------------------------------------------------------------------- Multi Wound Chart Details Patient Name: Date of Service: Robert [place], Donnal Debar Huang. 11/15/2020 8:45 A M Medical Record Number: [id number] Patient Account Number: [id number] Date of Birth/Sex: Treating RN: [birth year] (59 y.o. Robert Huang Primary Care Batu Cassin: Robert Huang Other Clinician: Referring Robert Huang: Treating Robert Huang/Extender: Robert Huang in Treatment: 8 Vital Signs Height(in): 72 Pulse(bpm): 79 Weight(lbs): 205 Blood Pressure(mmHg): 171/89 Body Mass Index(BMI): 28 Temperature(F):  98.4 Respiratory Rate(breaths/min): 17 Photos: [5:No Photos Left Metatarsal head fourth] [N/A:N/A N/A] Wound Location: [5:Shear/Friction] [N/A:N/A] Wounding Event: [5:Diabetic Wound/Ulcer of the Lower] [N/A:N/A] Primary Etiology: [5:Extremity Coronary Artery Disease,] [N/A:N/A] Comorbid History: [5:Hypertension, Type II Diabetes, Gout 08/29/2020] [N/A:N/A] Date Acquired: [5:8] [N/A:N/A] Weeks of Treatment: [5:Open] [N/A:N/A] Wound Status: [5:0.9x1.2x0.7] [N/A:N/A] Measurements L x W x D (cm) [5:0.848] [N/A:N/A] A (cm) : rea [5:0.594] [N/A:N/A] Volume (cm) : [5:76.00%] [N/A:N/A] % Reduction in A rea: [5:16.00%] [N/A:N/A] % Reduction in Volume: [5:9] Starting Position  1 (o'clock): [5:3] Ending Position 1 (o'clock): [5:1.2] Maximum Distance 1 (cm): [5:Yes] [N/A:N/A] Undermining: [5:Grade 2] [N/A:N/A] Classification: [5:Medium] [N/A:N/A] Exudate A mount: [5:Serosanguineous] [N/A:N/A] Exudate Type: [5:red, brown] [N/A:N/A] Exudate Color: [5:Well defined, not attached] [N/A:N/A] Wound Margin: [5:Large (67-100%)] [N/A:N/A] Granulation A mount: [5:Red, Pink] [N/A:N/A] Granulation Quality: [5:Small (1-33%)] [N/A:N/A] Necrotic A mount: [5:Fat Layer (Subcutaneous Tissue): Yes N/A] Exposed Structures: [5:Bone: Yes Fascia: No Tendon: No Muscle: No Joint: No None] [N/A:N/A] Epithelialization: [5:Debridement - Excisional] [N/A:N/A] Debridement: Pre-procedure Verification/Time Out 09:35 [N/A:N/A] Taken: [5:Lidocaine] [N/A:N/A] Pain Control: [5:Bone, Subcutaneous] [N/A:N/A] Tissue Debrided: [5:Skin/Subcutaneous] [N/A:N/A] Level: [5:Tissue/Muscle/Bone 1.08] [N/A:N/A] Debridement A (sq cm): [5:rea Curette] [N/A:N/A] Instrument: [5:Minimum] [N/A:N/A] Bleeding: [5:Pressure] [N/A:N/A] Hemostasis Achieved: [5:0] [N/A:N/A] Procedural Pain: [5:0] [N/A:N/A] Post Procedural Pain: Debridement Treatment Response: Procedure was tolerated well [N/A:N/A] Post Debridement Measurements L x 0.9x1.2x0.7  [N/A:N/A] W x D (cm) [5:0.594] [N/A:N/A] Post Debridement Volume: (cm) [5:Debridement] [N/A:N/A] Procedures Performed: [5:Huang Contact Cast otal] Treatment Notes Electronic Signature(s) Signed: 11/15/2020 5:23:06 PM By: Fonnie Mu RN Signed: 11/15/2020 5:50:43 PM By: Baltazar Najjar MD Entered By: Baltazar Najjar on 11/15/2020 09:49:58 -------------------------------------------------------------------------------- Multi-Disciplinary Care Plan Details Patient Name: Date of Service: Robert [place], Donnal Debar Huang. 11/15/2020 8:45 A M Medical Record Number: [id number] Patient Account Number: [id number] Date of Birth/Sex: Treating RN: 1961-04-12 (59 y.o. Robert Huang Primary Care Myeisha Kruser: Robert Huang Other Clinician: Referring Chevi Lim: Treating Cylus Douville/Extender: Robert Huang in Treatment: 8 Multidisciplinary Care Plan reviewed with physician Active Inactive Wound/Skin Impairment Nursing Diagnoses: Impaired tissue integrity Knowledge deficit related to ulceration/compromised skin integrity Goals: Patient/caregiver will verbalize understanding of skin care regimen Date Initiated: 09/14/2020 Target Resolution Date: 12/11/2020 Goal Status: Active Ulcer/skin breakdown will have a volume reduction of 30% by week 4 Date Initiated: 09/14/2020 Date Inactivated: 10/20/2020 Target Resolution Date: 10/12/2020 Unmet Reason: see wound Goal Status: Unmet measurements. Interventions: Assess patient/caregiver ability to obtain necessary supplies Assess patient/caregiver ability to perform ulcer/skin care regimen upon admission and as needed Assess ulceration(s) every visit Treatment Activities: Topical wound management initiated : 09/14/2020 Notes: Electronic Signature(s) Signed: 11/15/2020 5:23:06 PM By: Fonnie Mu RN Entered By: Fonnie Mu on 11/15/2020 09:40:05 -------------------------------------------------------------------------------- Pain  Assessment Details Patient Name: Date of Service: Robert [place], Donnal Debar Huang. 11/15/2020 8:45 A M Medical Record Number: [id number] Patient Account Number: [id number] Date of Birth/Sex: Treating RN: 03-09-1961 (60 y.o. Robert Huang Primary Care Kerington Hildebrant: Robert Huang Other Clinician: Referring Kashonda Sarkisyan: Treating Marajade Lei/Extender: Robert Huang in Treatment: 8 Active Problems Location of Pain Severity and Description of Pain Patient Has Paino No Site Locations Rate the pain. Current Pain Level: 0 Character of Pain Describe the Pain: Tender Pain Management and Medication Current Pain Management: Electronic Signature(s) Signed: 11/15/2020 5:23:06 PM By: Fonnie Mu RN Signed: 11/15/2020 5:27:17 PM By: [name] RN, BSN Entered By: [name] on 11/15/2020 08:55:38 -------------------------------------------------------------------------------- Patient/Caregiver Education Details Patient Name: Date of Service: Robert Huang 3/22/2022andnbsp8:45 A M Medical Record Number: [id number] Patient Account Number: [id number] Date of Birth/Gender: Treating RN: [birth year] (60 y.o. Lucious Groves Primary Care Physician: Robert Huang Other Clinician: Referring Physician: Treating Physician/Extender: Robert Huang in Treatment: 8 Education Assessment Education Provided To: Patient Education Topics Provided Elevated Blood Sugar/ Impact on Healing: Handouts: Elevated Blood Sugars: How Do They Affect Wound Healing Methods: Explain/Verbal Responses: State content correctly Electronic Signature(s) Signed: 11/15/2020 5:23:06 PM By: Fonnie Mu RN Entered By: Fonnie Mu on 11/15/2020 09:43:31 --------------------------------------------------------------------------------  Wound Assessment Details Patient Name: Date of Service: Robert Huang 11/15/2020 8:45 A M Medical Record Number:  [id number] Patient Account Number: [id number] Date of Birth/Sex: Treating RN: 26-Dec-1960 (60 y.o. Robert Huang Primary Care Saher Davee: Robert Huang Other Clinician: Referring Zeynep Fantroy: Treating Muriel Wilber/Extender: Robert Huang in Treatment: 8 Wound Status Wound Number: 5 Primary Diabetic Wound/Ulcer of the Lower Extremity Etiology: Wound Location: Left Metatarsal head fourth Wound Status: Open Wounding Event: Shear/Friction Comorbid Coronary Artery Disease, Hypertension, Type II Diabetes, Date Acquired: 08/29/2020 History: Gout Weeks Of Treatment: 8 Clustered Wound: No Photos Wound Measurements Length: (cm) 0.9 Width: (cm) 1.2 Depth: (cm) 0.7 Area: (cm) 0.848 Volume: (cm) 0.594 % Reduction in Area: 76% % Reduction in Volume: 16% Epithelialization: None Tunneling: No Undermining: Yes Starting Position (o'clock): 9 Ending Position (o'clock): 3 Maximum Distance: (cm) 1.2 Wound Description Classification: Grade 2 Wound Margin: Well defined, not attached Exudate Amount: Medium Exudate Type: Serosanguineous Exudate Color: red, brown Foul Odor After Cleansing: No Slough/Fibrino Yes Wound Bed Granulation Amount: Large (67-100%) Exposed Structure Granulation Quality: Red, Pink Fascia Exposed: No Necrotic Amount: Small (1-33%) Fat Layer (Subcutaneous Tissue) Exposed: Yes Necrotic Quality: Adherent Slough Tendon Exposed: No Muscle Exposed: No Joint Exposed: No Bone Exposed: Yes Treatment Notes Wound #5 (Metatarsal head fourth) Wound Laterality: Left Cleanser Soap and Water Discharge Instruction: May shower and wash wound with dial antibacterial soap and water prior to dressing change. Peri-Wound Care Topical Primary Dressing Promogran Prisma Matrix, 4.34 (sq in) (silver collagen) Discharge Instruction: Moisten collagen with saline or hydrogel Secondary Dressing Woven Gauze Sponges 2x2 in Discharge Instruction: Apply over primary  dressing as directed. Optifoam Non-Adhesive Dressing, 4x4 in Discharge Instruction: Apply over primary dressing cut to make foam donut Secured With Conforming Stretch Gauze Bandage, Sterile 2x75 (in/in) Discharge Instruction: Secure with stretch gauze as directed. Paper Tape, 2x10 (in/yd) Discharge Instruction: Secure dressing with tape as directed. Compression Wrap Compression Stockings Add-Ons Electronic Signature(s) Signed: 11/15/2020 5:23:06 PM By: Fonnie Mu RN Signed: 11/16/2020 7:45:09 AM By: Karl Ito Entered By: Karl Ito on 11/15/2020 16:12:07 -------------------------------------------------------------------------------- Vitals Details Patient Name: Date of Service: Robert [place], Donnal Debar Huang. 11/15/2020 8:45 A M Medical Record Number: [id number] Patient Account Number: [id number] Date of Birth/Sex: Treating RN: [birth year] (59 y.o. Robert Huang Primary Care Hershal Eriksson: Robert Huang Other Clinician: Referring Anushree Dorsi: Treating Jaryan Chicoine/Extender: Robert Huang in Treatment: 8 Vital Signs Time Taken: 08:43 Temperature (F): 98.4 Height (in): 72 Pulse (bpm): 79 Weight (lbs): 205 Respiratory Rate (breaths/min): 17 Body Mass Index (BMI): 27.8 Blood Pressure (mmHg): 171/89 Reference Range: 80 - 120 mg / dl Electronic Signature(s) Signed: 11/15/2020 12:58:57 PM By: Karl Ito Entered By: Karl Ito on 11/15/2020 08:44:37

## 2020-11-18 ENCOUNTER — Encounter (HOSPITAL_BASED_OUTPATIENT_CLINIC_OR_DEPARTMENT_OTHER): Payer: 59 | Admitting: Internal Medicine

## 2020-11-20 ENCOUNTER — Other Ambulatory Visit: Payer: Self-pay | Admitting: Family Medicine

## 2020-11-20 DIAGNOSIS — E7800 Pure hypercholesterolemia, unspecified: Secondary | ICD-10-CM

## 2020-11-21 NOTE — Telephone Encounter (Signed)
Left a message for pt to call the office and schedule a CPE/ OV for review / med refill

## 2020-11-22 ENCOUNTER — Encounter (HOSPITAL_BASED_OUTPATIENT_CLINIC_OR_DEPARTMENT_OTHER): Payer: 59 | Admitting: Internal Medicine

## 2020-11-22 ENCOUNTER — Other Ambulatory Visit: Payer: Self-pay

## 2020-11-22 DIAGNOSIS — I2510 Atherosclerotic heart disease of native coronary artery without angina pectoris: Secondary | ICD-10-CM | POA: Diagnosis not present

## 2020-11-22 DIAGNOSIS — L97524 Non-pressure chronic ulcer of other part of left foot with necrosis of bone: Secondary | ICD-10-CM | POA: Diagnosis not present

## 2020-11-22 DIAGNOSIS — I10 Essential (primary) hypertension: Secondary | ICD-10-CM | POA: Diagnosis not present

## 2020-11-22 DIAGNOSIS — Z955 Presence of coronary angioplasty implant and graft: Secondary | ICD-10-CM | POA: Diagnosis not present

## 2020-11-22 DIAGNOSIS — E1140 Type 2 diabetes mellitus with diabetic neuropathy, unspecified: Secondary | ICD-10-CM | POA: Diagnosis not present

## 2020-11-22 DIAGNOSIS — Z89432 Acquired absence of left foot: Secondary | ICD-10-CM | POA: Diagnosis not present

## 2020-11-22 DIAGNOSIS — L97522 Non-pressure chronic ulcer of other part of left foot with fat layer exposed: Secondary | ICD-10-CM | POA: Diagnosis not present

## 2020-11-22 DIAGNOSIS — E11621 Type 2 diabetes mellitus with foot ulcer: Secondary | ICD-10-CM | POA: Diagnosis not present

## 2020-11-23 ENCOUNTER — Encounter (HOSPITAL_BASED_OUTPATIENT_CLINIC_OR_DEPARTMENT_OTHER): Payer: 59 | Admitting: Physician Assistant

## 2020-11-24 NOTE — Progress Notes (Signed)
DENSIL, OTTEY T ([id number]) . Visit Report for 11/22/2020 Arrival Information Details Patient Name: Date of Service: Devin Going 11/22/2020 9:30 A M Medical Record Number: [id number] Patient Account Number: [id number] Date of Birth/Sex: Treating RN: June 26, 1961 (59 y.o. Bayard Hugger, Bonita Quin Primary Care Palin Tristan: [name] Other Clinician: Referring Raylyn Carton: Treating Keneshia Tena/Extender: Doran Stabler in Treatment: 9 Visit Information History Since Last Visit Added or deleted any medications: No Patient Arrived: Ambulatory Any new allergies or adverse reactions: No Arrival Time: 09:53 Had a fall or experienced change in No Accompanied By: self activities of daily living that may affect Transfer Assistance: None risk of falls: Patient Identification Verified: Yes Signs or symptoms of abuse/neglect since last visito No Secondary Verification Process Completed: Yes Hospitalized since last visit: No Patient Requires Transmission-Based Precautions: No Implantable device outside of the clinic excluding No Patient Has Alerts: Yes cellular tissue based products placed in the center Patient Alerts: R ABI: 0.96 since last visit: L ABI: 1.19 Has Dressing in Place as Prescribed: Yes Has Footwear/Offloading in Place as Prescribed: Yes Left: T Contact Cast otal Pain Present Now: Yes Notes cast cracked across top of foot. Pt reports walking around the house without boot on. Pt educated on need to wear the outer boot with the cast at all times. Electronic Signature(s) Signed: 11/22/2020 5:09:29 PM By: [name] RN, BSN Entered By: [name] on 11/22/2020 10:10:44 -------------------------------------------------------------------------------- Encounter Discharge Information Details Patient Name: Date of Service: [name], [name] 11/22/2020 9:30 A M Medical Record Number: [id number] Patient Account Number: [id number] Date of Birth/Sex:  Treating RN: 02/10/1961 (60 y.o. [name] Primary Care Treyshaun Keatts: [name] Other Clinician: Referring Satina Jerrell: Treating Gaylynn Seiple/Extender: Doran Stabler in Treatment: 9 Encounter Discharge Information Items Discharge Condition: Stable Ambulatory Status: Ambulatory Discharge Destination: Home Transportation: Private Auto Accompanied By: self Schedule Follow-up Appointment: Yes Clinical Summary of Care: Patient Declined Electronic Signature(s) Signed: 11/22/2020 5:09:29 PM By: [name] RN, BSN Entered By: [name] on 11/22/2020 12:57:52 -------------------------------------------------------------------------------- Lower Extremity Assessment Details Patient Name: Date of Service: [name] 11/22/2020 9:30 A M Medical Record Number: [id number] Patient Account Number: [id number] Date of Birth/Sex: Treating RN: Jul 04, 1961 (60 y.o. [name] Primary Care Maheen Cwikla: [name] Other Clinician: Referring Tyjanae Bartek: Treating Huber Mathers/Extender: Doran Stabler in Treatment: 9 Edema Assessment Assessed: Kyra Searles: No] Franne Forts: No] Edema: [Left: N] [Right: o] Calf Left: Right: Point of Measurement: 35 cm From Medial Instep 36.5 cm Ankle Left: Right: Point of Measurement: 11 cm From Medial Instep 21 cm Vascular Assessment Pulses: Dorsalis Pedis Palpable: [Left:Yes] Electronic Signature(s) Signed: 11/22/2020 5:09:29 PM By: [name] RN, BSN Entered By: [name] on 11/22/2020 10:05:54 -------------------------------------------------------------------------------- Multi Wound Chart Details Patient Name: Date of Service: [name], [name] 11/22/2020 9:30 A M Medical Record Number: [id number] Patient Account Number: [id number] Date of Birth/Sex: Treating RN: 1961/04/29 (59 y.o. Charlean Merl, [name] Primary Care Miking Usrey: [name] Other Clinician: Referring  Keywon Mestre: Treating Michi Herrmann/Extender: Doran Stabler in Treatment: 9 Vital Signs Height(in): 72 Capillary Blood Glucose(mg/dl): 827 Weight(lbs): 078 Pulse(bpm): 66 Body Mass Index(BMI): 28 Blood Pressure(mmHg): 157/76 Temperature(F): 98.3 Respiratory Rate(breaths/min): 18 Photos: [5:No Photos Left Metatarsal head fourth] [N/A:N/A N/A] Wound Location: [5:Shear/Friction] [N/A:N/A] Wounding Event: [5:Diabetic Wound/Ulcer of the Lower] [N/A:N/A] Primary Etiology: [5:Extremity Coronary Artery Disease,] [N/A:N/A] Comorbid History: [5:Hypertension, Type II Diabetes, Gout 08/29/2020] [N/A:N/A]  Date Acquired: [5:9] [N/A:N/A] Weeks of Treatment: [5:Open] [N/A:N/A] Wound Status: [5:0.8x1x0.8] [N/A:N/A] Measurements L x W x D (cm) [5:0.628] [N/A:N/A] A (cm) : rea [5:0.503] [N/A:N/A] Volume (cm) : [5:82.20%] [N/A:N/A] % Reduction in A rea: [5:28.90%] [N/A:N/A] % Reduction in Volume: [5:12] Starting Position 1 (o'clock): [5:12] Ending Position 1 (o'clock): [5:1.1] Maximum Distance 1 (cm): [5:Yes] [N/A:N/A] Undermining: [5:Grade 2] [N/A:N/A] Classification: [5:Medium] [N/A:N/A] Exudate A mount: [5:Serosanguineous] [N/A:N/A] Exudate Type: [5:red, brown] [N/A:N/A] Exudate Color: [5:Epibole] [N/A:N/A] Wound Margin: [5:Medium (34-66%)] [N/A:N/A] Granulation A mount: [5:Red, Pink] [N/A:N/A] Granulation Quality: [5:Medium (34-66%)] [N/A:N/A] Necrotic A mount: [5:Fat Layer (Subcutaneous Tissue): Yes N/A] Exposed Structures: [5:Bone: Yes Fascia: No Tendon: No Muscle: No Joint: No None] [N/A:N/A] Epithelialization: [5:T Contact Cast otal] [N/A:N/A] Treatment Notes Electronic Signature(s) Signed: 11/22/2020 5:10:40 PM By: Fonnie Mu RN Signed: 11/24/2020 7:47:56 AM By: Baltazar Najjar MD Entered By: Baltazar Najjar on 11/22/2020 10:26:29 -------------------------------------------------------------------------------- Multi-Disciplinary Care Plan Details Patient  Name: Date of Service: [name], [name] 11/22/2020 9:30 A M Medical Record Number: [id number] Patient Account Number: [id number] Date of Birth/Sex: Treating RN: [birth year] (59 y.o. Charlean Merl, [name] Primary Care Versie Fleener: [name] Other Clinician: Referring Tiffanee Mcnee: Treating Alfonso Shackett/Extender: Doran Stabler in Treatment: 9 Multidisciplinary Care Plan reviewed with physician Active Inactive Wound/Skin Impairment Nursing Diagnoses: Impaired tissue integrity Knowledge deficit related to ulceration/compromised skin integrity Goals: Patient/caregiver will verbalize understanding of skin care regimen Date Initiated: 09/14/2020 Target Resolution Date: 12/11/2020 Goal Status: Active Ulcer/skin breakdown will have a volume reduction of 30% by week 4 Date Initiated: 09/14/2020 Date Inactivated: 10/20/2020 Target Resolution Date: 10/12/2020 Unmet Reason: see wound Goal Status: Unmet measurements. Interventions: Assess patient/caregiver ability to obtain necessary supplies Assess patient/caregiver ability to perform ulcer/skin care regimen upon admission and as needed Assess ulceration(s) every visit Treatment Activities: Topical wound management initiated : 09/14/2020 Notes: Electronic Signature(s) Signed: 11/22/2020 5:10:40 PM By: Fonnie Mu RN Entered By: Fonnie Mu on 11/22/2020 09:50:35 -------------------------------------------------------------------------------- Pain Assessment Details Patient Name: Date of Service: [name], [name] 11/22/2020 9:30 A M Medical Record Number: [id number] Patient Account Number: [id number] Date of Birth/Sex: Treating RN: Jan 09, 1961 (60 y.o. [name] Primary Care Hakop Humbarger: [name] Other Clinician: Referring Ladavia Lindenbaum: Treating Zackarey Holleman/Extender: Doran Stabler in Treatment: 9 Active Problems Location of Pain Severity and Description of Pain Patient  Has Paino Yes Site Locations Pain Location: Pain in Ulcers Duration of the Pain. Constant / Intermittento Constant Rate the pain. Current Pain Level: 4 Worst Pain Level: 4 Least Pain Level: 1 Character of Pain Describe the Pain: Aching Pain Management and Medication Current Pain Management: Medication: Yes Is the Current Pain Management Adequate: Adequate How does your wound impact your activities of daily livingo Sleep: No Bathing: No Appetite: No Relationship With Others: No Bladder Continence: No Emotions: No Bowel Continence: No Work: No Toileting: No Drive: No Dressing: No Hobbies: No Electronic Signature(s) Signed: 11/22/2020 5:09:29 PM By: [name] RN, BSN Entered By: [name] on 11/22/2020 10:05:00 -------------------------------------------------------------------------------- Patient/Caregiver Education Details Patient Name: Date of Service: Devin Going 3/29/2022andnbsp9:30 A M Medical Record Number: [id number] Patient Account Number: [id number] Date of Birth/Gender: Treating RN: June 07, 1961 (60 y.o. Lucious Groves Primary Care Physician: [name] Other Clinician: Referring Physician: Treating Physician/Extender: Doran Stabler in Treatment: 9 Education Assessment Education Provided To: Patient Education Topics Provided Wound/Skin Impairment: Methods: Explain/Verbal Responses: State content correctly [employer]) Signed: 11/22/2020 5:10:40  PM By: Fonnie Mu RN Entered By: Fonnie Mu on 11/22/2020 09:59:50 -------------------------------------------------------------------------------- Wound Assessment Details Patient Name: Date of Service: Devin Going 11/22/2020 9:30 A M Medical Record Number: [id number] Patient Account Number: [id number] Date of Birth/Sex: Treating RN: 24-Jul-1961 (60 y.o. [name] Primary Care Adrea Sherpa: [name] Other  Clinician: Referring Yonael Tulloch: Treating Viral Schramm/Extender: Doran Stabler in Treatment: 9 Wound Status Wound Number: 5 Primary Diabetic Wound/Ulcer of the Lower Extremity Etiology: Wound Location: Left Metatarsal head fourth Wound Status: Open Wounding Event: Shear/Friction Comorbid Coronary Artery Disease, Hypertension, Type II Diabetes, Date Acquired: 08/29/2020 History: Gout Weeks Of Treatment: 9 Clustered Wound: No Photos Wound Measurements Length: (cm) 0.8 Width: (cm) 1 Depth: (cm) 0.8 Area: (cm) 0.628 Volume: (cm) 0.503 % Reduction in Area: 82.2% % Reduction in Volume: 28.9% Epithelialization: None Tunneling: No Undermining: Yes Starting Position (o'clock): 12 Ending Position (o'clock): 12 Maximum Distance: (cm) 1.1 Wound Description Classification: Grade 2 Wound Margin: Epibole Exudate Amount: Medium Exudate Type: Serosanguineous Exudate Color: red, brown Foul Odor After Cleansing: No Slough/Fibrino Yes Wound Bed Granulation Amount: Medium (34-66%) Exposed Structure Granulation Quality: Red, Pink Fascia Exposed: No Necrotic Amount: Medium (34-66%) Fat Layer (Subcutaneous Tissue) Exposed: Yes Necrotic Quality: Adherent Slough Tendon Exposed: No Muscle Exposed: No Joint Exposed: No Bone Exposed: Yes Treatment Notes Wound #5 (Metatarsal head fourth) Wound Laterality: Left Cleanser Soap and Water Discharge Instruction: May shower and wash wound with dial antibacterial soap and water prior to dressing change. Peri-Wound Care Topical Primary Dressing Promogran Prisma Matrix, 4.34 (sq in) (silver collagen) Discharge Instruction: Moisten collagen with saline or hydrogel Secondary Dressing Woven Gauze Sponges 2x2 in Discharge Instruction: Apply over primary dressing as directed. Optifoam Non-Adhesive Dressing, 4x4 in Discharge Instruction: Apply over primary dressing cut to make foam donut Secured With Conforming Stretch Gauze  Bandage, Sterile 2x75 (in/in) Discharge Instruction: Secure with stretch gauze as directed. Paper Tape, 2x10 (in/yd) Discharge Instruction: Secure dressing with tape as directed. Compression Wrap Compression Stockings Add-Ons Electronic Signature(s) Signed: 11/22/2020 5:09:29 PM By: [name] RN, BSN Signed: 11/23/2020 9:05:34 AM By: Karl Ito Entered By: Karl Ito on 11/22/2020 16:36:33 -------------------------------------------------------------------------------- Vitals Details Patient Name: Date of Service: [name], [name] 11/22/2020 9:30 A M Medical Record Number: [id number] Patient Account Number: [id number] Date of Birth/Sex: Treating RN: 05-07-1961 (59 y.o. [name] Primary Care Sarath Privott: Other Clinician: [name] Referring Javarian Jakubiak: Treating Keylan Costabile/Extender: Doran Stabler in Treatment: 9 Vital Signs Time Taken: 09:54 Temperature (F): 98.3 Height (in): 72 Pulse (bpm): 66 Source: Stated Respiratory Rate (breaths/min): 18 Weight (lbs): 205 Blood Pressure (mmHg): 157/76 Source: Stated Capillary Blood Glucose (mg/dl): 924 Body Mass Index (BMI): 27.8 Reference Range: 80 - 120 mg / dl Notes glucose per pt report this am Electronic Signature(s) Signed: 11/22/2020 5:09:29 PM By: [name] RN, BSN Entered By: [name] on 11/22/2020 09:55:11

## 2020-11-24 NOTE — Progress Notes (Signed)
Robert Huang, Robert Huang ([id number]) . Visit Report for 11/22/2020 HPI Details Patient Name: Date of Service: Robert Huang 11/22/2020 9:30 A M Medical Record Number: [id number] Patient Account Number: [id number] Date of Birth/Sex: Treating RN: [birth year] (60 y.o. Robert Huang, Robert Huang Primary Care Provider: Laurey Huang Other Clinician: Referring Provider: Treating Provider/Extender: Robert Huang Kitchen in Treatment: 9 History of Present Illness HPI Description: ADMISSION 05/27/2020 This is a 60 year old man who has type 2 diabetes who is here for wounds on his left foot in 2 locations. The patient has had problems with foot ulcers requiring surgery including a right great toe amputation and a left fifth ray amputation in July 2020 for underlying osteomyelitis although strangely I cannot see any pathology or cultures done in this area. He states that the area on his left fourth met head plantar aspect opened about a month ago. He has had continuous callus in this area. He saw Dr. Sarajane Jews his primary doctor at the beginning of September and was felt to have a cellulitis in the lateral part of his foot in this area he was given a shot of Rocephin and then ongoing antibiotics with Keflex for 10 days. It would appear that he was supposed to follow-up with Dr. Sarajane Jews although I do not see these notes. About a week and a half ago he stubbed his left first toe and has a wound on this area. He is using Neosporin to both wound areas. 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 Sarajane Jews ABI in our clinic was 1.23 on the left 10/8; the patient's area over the left fourth metatarsal head is fully closed.  This was a split callus last week that I debrided. He still has an open area over the tip of his left great toe. He is using a forefoot offloading and silver alginate. We made [facility] an appointment with [facility] to see about custom made diabetic shoes with inserts this appointment is for next Tuesday. Readmission: 09/14/2020 upon evaluation today patient appears to have several wounds over his left dorsal great toe, left fourth metatarsal head, and right second toe. The good news is none of these appear to be severe at this point. Nonetheless unfortunately he is going require a little bit of the debridement today to try to clear away some of the necrotic debris currently. Specifically the left metatarsal head of the fourth metatarsal does look particularly is a wound that is good to require sharp debridement. The patient does have a history of diabetes mellitus type 2, hypertension, and diabetic neuropathy. He has previously been seen here in the clinic and I am not certain that he is ever really have this completely healed although we have not seen him since October 2020. Fortunately his ABIs appear to be doing excellent today. 1/27; patient was readmitted to the clinic last week. He had an area over his right second toe which is healed also of the left dorsal great toe is also healed. He attributes these wounds to a hunting trip he took with boots that perhaps were not helpful on his feet. He still has the area on the fourth met head status post left fifth  ray amputation in July 20 2/3; left fourth metatarsal head. Wound is about the same size as last week some debris around the circumference of the wound including callus and dry skin I removed this knocked down the circumference hopefully to make the surrounding tissue a little less senescent. Still using silver collagen. I talked to him about a total contact cast 2/17; 2-week follow-up. Left fourth metatarsal head. He is status post fifth  ray amputation in July 2020. I thought we would be able to put a total contact cast on him however he arrives with a dramatic worsening of the wound with increased depth to bone as well as undermining from 6-3 o'clock. Scant amount of purulent drainage which I have cultured. This deterioration this week would obviously preclude a total contact cast. 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 He has a forefoot offloading boot 2/24; culture of this wound that I did last time after registering a fairly dramatic deterioration with exposed bone showed MRSA and Enterococcus faecalis. After some difficulty with insurance and cost of linezolid he is taking that now. Huang olerating it well. He will definitely need an MRI of the foot and I have ordered this. Using silver alginate on the wound 10/31/2020 upon evaluation today patient appears to be doing well with regard to his wound. Fortunately there is no signs of active infection at this time. In fact I feel like that the the linezolid did a great job for him. Overall I am very pleased in that regard. 3/14; I have not seen this patient in 3 weeks. The purulent drainage I cultured showed MRSA and Enterococcus on 2/17. We gave him linezolid but only for a week this was not renewed last week. I have reviewed his MRI of the foot that did not show osteomyelitis nevertheless he has a wide swath of bone which is his fourth metatarsal head. Using Iodoflex but I change this to silver alginate 3/22; patient has completed antibiotics. Culture I did last week which was a bone scraping with negative. His previous MRI did not show osteomyelitis myelitis. Nevertheless he has a wide swath of bone over the fourth metatarsal head. He had a dramatic deterioration in this wound in late February with cultures showing MRSA and Enterococcus we gave him line escalated he is  responded well. 3/29; this is a patient who came in with several wounds on his left foot the only 1 remaining is the left fourth metatarsal head. This initially looked satisfactory but deteriorated quite markedly in late February. Cultures at that time showed MRSA and Enterococcus I gave him a line escalated this seemed to have helped. His MRI did not support osteomyelitis however after this infection he developed an open area medially in the wound that goes to bone. Follow-up bone scraping of this area was negative I put him in a total contact cast last week to see if we can support some granulation. We are using Prisma as the primary dressing I had a quick look at the wound today. He was predominantly here for his first total contact cast change. It certainly is no better still the wide area medially with exposed bone. There is no purulence Electronic Signature(s) Signed: 11/24/2020 7:47:56 AM By: Linton Ham MD Entered By: Linton Ham on 11/22/2020 10:27:19 -------------------------------------------------------------------------------- Physical Exam Details Patient Name: Date of Service: Robert Huang, A LA  N Huang. 11/22/2020 9:30 A M Medical Record Number: [id number] Patient Account Number: [id number] Date of Birth/Sex: Treating RN: Mar 08, 1961 (60 y.o. Robert Huang Primary Care Provider: Laurey Huang Other Clinician: Referring Provider: Treating Provider/Extender: Robert Huang Kitchen in Treatment: 9 Constitutional Patient is hypertensive.. Pulse regular and within target range for patient.Robert Huang Kitchen Respirations regular, non-labored and within target range.. Temperature is normal and within the target range for the patient.Robert Huang Kitchen Appears in no distress. Cardiovascular Pedal pulses are palpable on the left. Notes Wound exam; healthy looking tissue laterally however significant undermining medially that goes right to bone. Wide area involved. I have been frankly surprised  that the MRI was negative by bone scraping was also negative. He is not currently on antibiotics there is no other evidence of active infection. Electronic Signature(s) Signed: 11/24/2020 7:47:56 AM By: Linton Ham MD Entered By: Linton Ham on 11/22/2020 10:28:38 -------------------------------------------------------------------------------- Physician Orders Details Patient Name: Date of Service: Robert Huang, Robert Huang. 11/22/2020 9:30 A M Medical Record Number: [id number] Patient Account Number: [id number] Date of Birth/Sex: Treating RN: 1961-08-02 (60 y.o. Robert Huang, Robert Huang Primary Care Provider: Laurey Huang Other Clinician: Referring Provider: Treating Provider/Extender: Robert Huang Kitchen in Treatment: 9 Verbal / Phone Orders: No Diagnosis Coding Follow-up Appointments Return Appointment in 1 week. Bathing/ Shower/ Hygiene May shower with protection but do not get wound dressing(s) wet. - [facility] CAN NOT GET WET. Edema Control - Lymphedema / SCD / Other Elevate legs to the level of the heart or above for 30 minutes daily and/or when sitting, a frequency of: Avoid standing for long periods of time. Off-Loading Total Contact Cast to Left Lower Extremity Non Wound Condition Protect area with: - pad with foam or gauze to right 2nd toe. Pad shin area with foam Wound Treatment Wound #5 - Metatarsal head fourth Wound Laterality: Left Cleanser: Soap and Water Every Other Day/7 Days Discharge Instructions: May shower and wash wound with dial antibacterial soap and water prior to dressing change. Prim Dressing: Promogran Prisma Matrix, 4.34 (sq in) (silver collagen) Every Other Day/7 Days ary Discharge Instructions: Moisten collagen with saline or hydrogel Secondary Dressing: Woven Gauze Sponges 2x2 in Every Other Day/7 Days Discharge Instructions: Apply over primary dressing as directed. Secondary Dressing: Optifoam Non-Adhesive Dressing, 4x4 in Every Other  Day/7 Days Discharge Instructions: Apply over primary dressing cut to make foam donut Secured With: Conforming Stretch Gauze Bandage, Sterile 2x75 (in/in) Every Other Day/7 Days Discharge Instructions: Secure with stretch gauze as directed. Secured With: Paper Tape, 2x10 (in/yd) Every Other Day/7 Days Discharge Instructions: Secure dressing with tape as directed. Electronic Signature(s) Signed: 11/22/2020 5:10:40 PM By: Rhae Hammock RN Signed: 11/24/2020 7:47:56 AM By: Linton Ham MD Entered By: Rhae Hammock on 11/22/2020 10:20:23 -------------------------------------------------------------------------------- Problem List Details Patient Name: Date of Service: Robert Huang, Robert Huang. 11/22/2020 9:30 A M Medical Record Number: [id number] Patient Account Number: [id number] Date of Birth/Sex: Treating RN: [birth year] (60 y.o. Robert Huang, Robert Huang Primary Care Provider: Laurey Huang Other Clinician: Referring Provider: Treating Provider/Extender: Robert Huang Kitchen in Treatment: 9 Active Problems ICD-10 Encounter Code Description Active Date MDM Diagnosis E11.621 Type 2 diabetes mellitus with foot ulcer 09/14/2020 No Yes L97.524 Non-pressure chronic ulcer of other part of left foot with necrosis of bone 11/15/2020 No Yes E11.40 Type 2 diabetes mellitus with diabetic neuropathy, unspecified 09/14/2020 No Yes I10 Essential (primary) hypertension 09/14/2020 No Yes L03.116 Cellulitis of left lower limb 10/20/2020  No Yes Inactive Problems ICD-10 Code Description Active Date Inactive Date L97.512 Non-pressure chronic ulcer of other part of right foot with fat layer exposed 09/14/2020 09/14/2020 Resolved Problems Electronic Signature(s) Signed: 11/24/2020 7:47:56 AM By: Linton Ham MD Entered By: Linton Ham on 11/22/2020 10:26:22 -------------------------------------------------------------------------------- Progress Note Details Patient Name: Date of  Service: Robert Huang, Robert Huang. 11/22/2020 9:30 A M Medical Record Number: [id number] Patient Account Number: [id number] Date of Birth/Sex: Treating RN: March 24, 1961 (60 y.o. Robert Huang, Robert Huang Primary Care Provider: Laurey Huang Other Clinician: Referring Provider: Treating Provider/Extender: Robert Huang Kitchen in Treatment: 9 Subjective History of Present Illness (HPI) ADMISSION 05/27/2020 This is a 60 year old man who has type 2 diabetes who is here for wounds on his left foot in 2 locations. The patient has had problems with foot ulcers requiring surgery including a right great toe amputation and a left fifth ray amputation in July 2020 for underlying osteomyelitis although strangely I cannot see any pathology or cultures done in this area. He states that the area on his left fourth met head plantar aspect opened about a month ago. He has had continuous callus in this area. He saw Dr. Sarajane Jews his primary doctor at the beginning of September and was felt to have a cellulitis in the lateral part of his foot in this area he was given a shot of Rocephin and then ongoing antibiotics with Keflex for 10 days. It would appear that he was supposed to follow-up with Dr. Sarajane Jews although I do not see these notes. About a week and a half ago he stubbed his left first toe and has a wound on this area. He is using Neosporin to both wound areas. 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 Sarajane Jews ABI in our clinic was 1.23 on the left 10/8; the patient's area over the left fourth metatarsal head is fully closed. This was a split callus last week that I debrided. He still has an open area over the tip of his  left great toe. He is using a forefoot offloading and silver alginate. We made [facility] an appointment with [facility] to see about custom made diabetic shoes with inserts this appointment is for next Tuesday. Readmission: 09/14/2020 upon evaluation today patient appears to have several wounds over his left dorsal great toe, left fourth metatarsal head, and right second toe. The good news is none of these appear to be severe at this point. Nonetheless unfortunately he is going require a little bit of the debridement today to try to clear away some of the necrotic debris currently. Specifically the left metatarsal head of the fourth metatarsal does look particularly is a wound that is good to require sharp debridement. The patient does have a history of diabetes mellitus type 2, hypertension, and diabetic neuropathy. He has previously been seen here in the clinic and I am not certain that he is ever really have this completely healed although we have not seen him since October 2020. Fortunately his ABIs appear to be doing excellent today. 1/27; patient was readmitted to the clinic last week. He had an area over his right second toe which is healed also of the left  dorsal great toe is also healed. He attributes these wounds to a hunting trip he took with boots that perhaps were not helpful on his feet. He still has the area on the fourth met head status post left fifth ray amputation in July 20 2/3; left fourth metatarsal head. Wound is about the same size as last week some debris around the circumference of the wound including callus and dry skin I removed this knocked down the circumference hopefully to make the surrounding tissue a little less senescent. Still using silver collagen. I talked to him about a total contact cast 2/17; 2-week follow-up. Left fourth metatarsal head. He is status post fifth ray amputation in July 2020. I thought we would be able to put a total contact cast on him  however he arrives with a dramatic worsening of the wound with increased depth to bone as well as undermining from 6-3 o'clock. Scant amount of purulent drainage which I have cultured. This deterioration this week would obviously preclude a total contact cast. 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 He has a forefoot offloading boot 2/24; culture of this wound that I did last time after registering a fairly dramatic deterioration with exposed bone showed MRSA and Enterococcus faecalis. After some difficulty with insurance and cost of linezolid he is taking that now. Huang olerating it well. He will definitely need an MRI of the foot and I have ordered this. Using silver alginate on the wound 10/31/2020 upon evaluation today patient appears to be doing well with regard to his wound. Fortunately there is no signs of active infection at this time. In fact I feel like that the the linezolid did a great job for him. Overall I am very pleased in that regard. 3/14; I have not seen this patient in 3 weeks. The purulent drainage I cultured showed MRSA and Enterococcus on 2/17. We gave him linezolid but only for a week this was not renewed last week. I have reviewed his MRI of the foot that did not show osteomyelitis nevertheless he has a wide swath of bone which is his fourth metatarsal head. Using Iodoflex but I change this to silver alginate 3/22; patient has completed antibiotics. Culture I did last week which was a bone scraping with negative. His previous MRI did not show osteomyelitis myelitis. Nevertheless he has a wide swath of bone over the fourth metatarsal head. He had a dramatic deterioration in this wound in late February with cultures showing MRSA and Enterococcus we gave him line escalated he is responded well. 3/29; this is a patient who came in with several wounds on his left foot the  only 1 remaining is the left fourth metatarsal head. This initially looked satisfactory but deteriorated quite markedly in late February. Cultures at that time showed MRSA and Enterococcus I gave him a line escalated this seemed to have helped. His MRI did not support osteomyelitis however after this infection he developed an open area medially in the wound that goes to bone. Follow-up bone scraping of this area was negative I put him in a total contact cast last week to see if we can support some granulation. We are using Prisma as the primary dressing I had a quick look at the wound today. He was predominantly here for his first total contact cast change. It certainly is no better still the  wide area medially with exposed bone. There is no purulence Objective Constitutional Patient is hypertensive.. Pulse regular and within target range for patient.Robert Huang Kitchen Respirations regular, non-labored and within target range.. Temperature is normal and within the target range for the patient.Robert Huang Kitchen Appears in no distress. Vitals Time Taken: 9:54 AM, Height: 72 in, Source: Stated, Weight: 205 lbs, Source: Stated, BMI: 27.8, Temperature: 98.3 F, Pulse: 66 bpm, Respiratory Rate: 18 breaths/min, Blood Pressure: 157/76 mmHg, Capillary Blood Glucose: 133 mg/dl. General Notes: glucose per pt report this am Cardiovascular Pedal pulses are palpable on the left. General Notes: Wound exam; healthy looking tissue laterally however significant undermining medially that goes right to bone. Wide area involved. I have been frankly surprised that the MRI was negative by bone scraping was also negative. He is not currently on antibiotics there is no other evidence of active infection. Integumentary (Hair, Skin) Wound #5 status is Open. Original cause of wound was Shear/Friction. The date acquired was: 08/29/2020. The wound has been in treatment 9 weeks. The wound is located on the Left Metatarsal head fourth. The wound measures 0.8cm  length x 1cm width x 0.8cm depth; 0.628cm^2 area and 0.503cm^3 volume. There is bone and Fat Layer (Subcutaneous Tissue) exposed. There is no tunneling noted, however, there is undermining starting at 12:00 and ending at 12:00 with a maximum distance of 1.1cm. There is a medium amount of serosanguineous drainage noted. The wound margin is epibole. There is medium (34-66%) red, pink granulation within the wound bed. There is a medium (34-66%) amount of necrotic tissue within the wound bed including Adherent Slough. Assessment Active Problems ICD-10 Type 2 diabetes mellitus with foot ulcer Non-pressure chronic ulcer of other part of left foot with necrosis of bone Type 2 diabetes mellitus with diabetic neuropathy, unspecified Essential (primary) hypertension Cellulitis of left lower limb Procedures Wound #5 Pre-procedure diagnosis of Wound #5 is a Diabetic Wound/Ulcer of the Lower Extremity located on the Left Metatarsal head fourth . There was a Huang Contact otal Cast Procedure by Ricard Dillon., MD. Post procedure Diagnosis Wound #5: Same as Pre-Procedure Plan Follow-up Appointments: Return Appointment in 1 week. Bathing/ Shower/ Hygiene: May shower with protection but do not get wound dressing(s) wet. - [facility] CAN NOT GET WET. Edema Control - Lymphedema / SCD / Other: Elevate legs to the level of the heart or above for 30 minutes daily and/or when sitting, a frequency of: Avoid standing for long periods of time. Off-Loading: Huang Contact Cast to Left Lower Extremity otal Non Wound Condition: Protect area with: - pad with foam or gauze to right 2nd toe. Pad shin area with foam WOUND #5: - Metatarsal head fourth Wound Laterality: Left Cleanser: Soap and Water Every Other Day/7 Days Discharge Instructions: May shower and wash wound with dial antibacterial soap and water prior to dressing change. Prim Dressing: Promogran Prisma Matrix, 4.34 (sq in) (silver collagen) Every Other Day/7  Days ary Discharge Instructions: Moisten collagen with saline or hydrogel Secondary Dressing: Woven Gauze Sponges 2x2 in Every Other Day/7 Days Discharge Instructions: Apply over primary dressing as directed. Secondary Dressing: Optifoam Non-Adhesive Dressing, 4x4 in Every Other Day/7 Days Discharge Instructions: Apply over primary dressing cut to make foam donut Secured With: Conforming Stretch Gauze Bandage, Sterile 2x75 (in/in) Every Other Day/7 Days Discharge Instructions: Secure with stretch gauze as directed. Secured With: Paper Tape, 2x10 (in/yd) Every Other Day/7 Days Discharge Instructions: Secure dressing with tape as directed. #1 silver collagen, total contact cast to continue 2. Dermagraft through  his insurance 3. The MRI being negative was somewhat surprising to me I did a follow-up bone scraping this was also negative. Electronic Signature(s) Signed: 11/24/2020 7:47:56 AM By: Linton Ham MD Entered By: Linton Ham on 11/22/2020 10:29:27 -------------------------------------------------------------------------------- Total Contact Cast Details Patient Name: Date of Service: Robert Huang, Robert Huang. 11/22/2020 9:30 A M Medical Record Number: [id number] Patient Account Number: [id number] Date of Birth/Sex: Treating RN: 1961/03/13 (60 y.o. Robert Huang, Robert Huang Primary Care Provider: Laurey Huang Other Clinician: Referring Provider: Treating Provider/Extender: Robert Huang Kitchen in Treatment: 9 Huang Contact Cast Applied for Wound Assessment: otal Wound #5 Left Metatarsal head fourth Performed By: Physician Ricard Dillon., MD Post Procedure Diagnosis Same as Pre-procedure Electronic Signature(s) Signed: 11/24/2020 7:47:56 AM By: Linton Ham MD Entered By: Linton Ham on 11/22/2020 10:26:40 -------------------------------------------------------------------------------- SuperBill Details Patient Name: Date of Service: Robert Huang, Keane Scrape 11/22/2020 Medical Record Number: [id number] Patient Account Number: [id number] Date of Birth/Sex: Treating RN: 12-Nov-1960 (60 y.o. Robert Huang, Robert Huang Primary Care Provider: Laurey Huang Other Clinician: Referring Provider: Treating Provider/Extender: Robert Huang Kitchen in Treatment: 9 Diagnosis Coding ICD-10 Codes Code Description [id number] Type 2 diabetes mellitus with foot ulcer L97.522 Non-pressure chronic ulcer of other part of left foot with fat layer exposed L97.512 Non-pressure chronic ulcer of other part of right foot with fat layer exposed E11.40 Type 2 diabetes mellitus with diabetic neuropathy, unspecified I10 Essential (primary) hypertension L03.116 Cellulitis of left lower limb Facility Procedures CPT4 Code: [id number] Description: [id number] - APPLY TOTAL CONTACT [facility] ICD-10 Diagnosis Description L97.522 Non-pressure chronic ulcer of other part of left foot with fat layer exposed Modifier: Quantity: 1 Physician Procedures : CPT4 Code Description Modifier [id number] - WC PHYS APPLY TOTAL CONTACT CAST ICD-10 Diagnosis Description L97.522 Non-pressure chronic ulcer of other part of left foot with fat layer exposed Quantity: 1 Electronic Signature(s) Signed: 11/24/2020 7:47:56 AM By: Linton Ham MD Entered By: Linton Ham on 11/22/2020 10:29:37

## 2020-11-29 ENCOUNTER — Encounter (HOSPITAL_BASED_OUTPATIENT_CLINIC_OR_DEPARTMENT_OTHER): Payer: 59 | Attending: Internal Medicine | Admitting: Internal Medicine

## 2020-11-29 ENCOUNTER — Other Ambulatory Visit: Payer: Self-pay

## 2020-11-29 DIAGNOSIS — Z955 Presence of coronary angioplasty implant and graft: Secondary | ICD-10-CM | POA: Insufficient documentation

## 2020-11-29 DIAGNOSIS — L97524 Non-pressure chronic ulcer of other part of left foot with necrosis of bone: Secondary | ICD-10-CM | POA: Insufficient documentation

## 2020-11-29 DIAGNOSIS — L03116 Cellulitis of left lower limb: Secondary | ICD-10-CM | POA: Insufficient documentation

## 2020-11-29 DIAGNOSIS — I10 Essential (primary) hypertension: Secondary | ICD-10-CM | POA: Diagnosis not present

## 2020-11-29 DIAGNOSIS — E1151 Type 2 diabetes mellitus with diabetic peripheral angiopathy without gangrene: Secondary | ICD-10-CM | POA: Diagnosis not present

## 2020-11-29 DIAGNOSIS — E1140 Type 2 diabetes mellitus with diabetic neuropathy, unspecified: Secondary | ICD-10-CM | POA: Diagnosis not present

## 2020-11-29 DIAGNOSIS — E11621 Type 2 diabetes mellitus with foot ulcer: Secondary | ICD-10-CM | POA: Diagnosis not present

## 2020-11-30 NOTE — Progress Notes (Signed)
OSHAE, SIMMERING T ([id number]) . Visit Report for 11/29/2020 HPI Details Patient Name: Date of Service: Robert Huang 11/29/2020 9:00 A M Medical Record Number: [id number] Patient Account Number: [id number] Date of Birth/Sex: Treating RN: 11-Dec-1960 (60 y.o. [name], [name] Primary Care Provider: [name] Other Clinician: Referring Provider: Treating Provider/Extender: [name] in Treatment: 10 History of Present Illness HPI Description: ADMISSION 05/27/2020 This is a 60 year old man who has type 2 diabetes who is here for wounds on his left foot in 2 locations. The patient has had problems with foot ulcers requiring surgery including a right great toe amputation and a left fifth ray amputation in July 2020 for underlying osteomyelitis although strangely I cannot see any pathology or cultures done in this area. He states that the area on his left fourth met head plantar aspect opened about a month ago. He has had continuous callus in this area. He saw Dr. Sarajane Jews his primary doctor at the beginning of September and was felt to have a cellulitis in the lateral part of his foot in this area he was given a shot of Rocephin and then ongoing antibiotics with Keflex for 10 days. It would appear that he was supposed to follow-up with Dr. Sarajane Jews although I do not see these notes. About a week and a half ago he stubbed his left first toe and has a wound on this area. He is using Neosporin to both wound areas. 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 Sarajane Jews ABI in our clinic was 1.23 on the left 10/8; the patient's area over the left fourth metatarsal head is fully closed.  This was a split callus last week that I debrided. He still has an open area over the tip of his left great toe. He is using a forefoot offloading and silver alginate. We made him an appointment with [facility] to see about custom made diabetic shoes with inserts this appointment is for next Tuesday. Readmission: 09/14/2020 upon evaluation today patient appears to have several wounds over his left dorsal great toe, left fourth metatarsal head, and right second toe. The good news is none of these appear to be severe at this point. Nonetheless unfortunately he is going require a little bit of the debridement today to try to clear away some of the necrotic debris currently. Specifically the left metatarsal head of the fourth metatarsal does look particularly is a wound that is good to require sharp debridement. The patient does have a history of diabetes mellitus type 2, hypertension, and diabetic neuropathy. He has previously been seen here in the clinic and I am not certain that he is ever really have this completely healed although we have not seen him since October 2020. Fortunately his ABIs appear to be doing excellent today. 1/27; patient was readmitted to the clinic last week. He had an area over his right second toe which is healed also of the left dorsal great toe is also healed. He attributes these wounds to a hunting trip he took with boots that perhaps were not helpful on his feet. He still has the area on the fourth met head status post left fifth  ray amputation in July 20 2/3; left fourth metatarsal head. Wound is about the same size as last week some debris around the circumference of the wound including callus and dry skin I removed this knocked down the circumference hopefully to make the surrounding tissue a little less senescent. Still using silver collagen. I talked to him about a total contact cast 2/17; 2-week follow-up. Left fourth metatarsal head. He is status post fifth  ray amputation in July 2020. I thought we would be able to put a total contact cast on him however he arrives with a dramatic worsening of the wound with increased depth to bone as well as undermining from 6-3 o'clock. Scant amount of purulent drainage which I have cultured. This deterioration this week would obviously preclude a total contact cast. 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 He has a forefoot offloading boot 2/24; culture of this wound that I did last time after registering a fairly dramatic deterioration with exposed bone showed MRSA and Enterococcus faecalis. After some difficulty with insurance and cost of linezolid he is taking that now. T olerating it well. He will definitely need an MRI of the foot and I have ordered this. Using silver alginate on the wound 10/31/2020 upon evaluation today patient appears to be doing well with regard to his wound. Fortunately there is no signs of active infection at this time. In fact I feel like that the the linezolid did a great job for him. Overall I am very pleased in that regard. 3/14; I have not seen this patient in 3 weeks. The purulent drainage I cultured showed MRSA and Enterococcus on 2/17. We gave him linezolid but only for a week this was not renewed last week. I have reviewed his MRI of the foot that did not show osteomyelitis nevertheless he has a wide swath of bone which is his fourth metatarsal head. Using Iodoflex but I change this to silver alginate 3/22; patient has completed antibiotics. Culture I did last week which was a bone scraping with negative. His previous MRI did not show osteomyelitis myelitis. Nevertheless he has a wide swath of bone over the fourth metatarsal head. He had a dramatic deterioration in this wound in late February with cultures showing MRSA and Enterococcus we gave him line escalated he is  responded well. 3/29; this is a patient who came in with several wounds on his left foot the only 1 remaining is the left fourth metatarsal head. This initially looked satisfactory but deteriorated quite markedly in late February. Cultures at that time showed MRSA and Enterococcus I gave him a line escalated this seemed to have helped. His MRI did not support osteomyelitis however after this infection he developed an open area medially in the wound that goes to bone. Follow-up bone scraping of this area was negative I put him in a total contact cast last week to see if we can support some granulation. We are using Prisma as the primary dressing I had a quick look at the wound today. He was predominantly here for his first total contact cast change. It certainly is no better still the wide area medially with exposed bone. There is no purulence 4/5; not much change in wound dimensions however the granulation looks better. We are using silver collagen under a total contact cast. The real problem is in the more proximal  part of the wound towards the heel still exposed bone and a substantial amount of undermining Electronic Signature(s) Signed: 11/29/2020 4:57:40 PM By: Linton Ham MD Entered By: Linton Ham on 11/29/2020 09:44:02 -------------------------------------------------------------------------------- Physical Exam Details Patient Name: Date of Service: Robert Huang, [name] 11/29/2020 9:00 A M Medical Record Number: [id number] Patient Account Number: [id number] Date of Birth/Sex: Treating RN: Jun 20, 1961 (60 y.o. Erie Noe Primary Care Provider: [name] Other Clinician: Referring Provider: Treating Provider/Extender: [name] in Treatment: 10 Constitutional Patient is hypertensive.. Pulse regular and within target range for patient.[name] Respirations regular, non-labored and within target range.. Temperature is normal and within the target  range for the patient.[name] Appears in no distress. Notes Wound exam; healthy looking tissue laterally and distally however proximally there is still an open undermining area with exposed bone that is palpable to the Q-tip. This does not look to be infected. The granulation looks a little better and a little more vibrant under the total contact cast Electronic Signature(s) Signed: 11/29/2020 4:57:40 PM By: Linton Ham MD Entered By: Linton Ham on 11/29/2020 09:45:00 -------------------------------------------------------------------------------- Physician Orders Details Patient Name: Date of Service: Robert Huang, [name] 11/29/2020 9:00 A M Medical Record Number: [id number] Patient Account Number: [id number] Date of Birth/Sex: Treating RN: 10/14/1960 (60 y.o. [name], [name] Primary Care Provider: [name] Other Clinician: Referring Provider: Treating Provider/Extender: [name] in Treatment: 10 Verbal / Phone Orders: No Diagnosis Coding Follow-up Appointments Return Appointment in 1 week. Bathing/ Shower/ Hygiene May shower with protection but do not get wound dressing(s) wet. - TCC CAN NOT GET WET. Edema Control - Lymphedema / SCD / Other Elevate legs to the level of the heart or above for 30 minutes daily and/or when sitting, a frequency of: Avoid standing for long periods of time. Off-Loading Total Contact Cast to Left Lower Extremity Non Wound Condition Protect area with: - pad with foam or gauze to right 2nd toe. Pad shin area with foam Wound Treatment Wound #5 - Metatarsal head fourth Wound Laterality: Left Cleanser: Soap and Water Every Other Day/7 Days Discharge Instructions: May shower and wash wound with dial antibacterial soap and water prior to dressing change. Prim Dressing: Promogran Prisma Matrix, 4.34 (sq in) (silver collagen) Every Other Day/7 Days ary Discharge Instructions: Moisten collagen with saline or  hydrogel Secondary Dressing: Woven Gauze Sponges 2x2 in Every Other Day/7 Days Discharge Instructions: Apply over primary dressing as directed. Secondary Dressing: Optifoam Non-Adhesive Dressing, 4x4 in Every Other Day/7 Days Discharge Instructions: Apply over primary dressing cut to make foam donut Secured With: Conforming Stretch Gauze Bandage, Sterile 2x75 (in/in) Every Other Day/7 Days Discharge Instructions: Secure with stretch gauze as directed. Secured With: Paper Tape, 2x10 (in/yd) Every Other Day/7 Days Discharge Instructions: Secure dressing with tape as directed. Electronic Signature(s) Signed: 11/29/2020 4:57:40 PM By: Linton Ham MD Signed: 11/30/2020 5:28:50 PM By: Rhae Hammock RN Entered By: Rhae Hammock on 11/29/2020 09:29:25 -------------------------------------------------------------------------------- Problem List Details Patient Name: Date of Service: Robert Huang, [name] 11/29/2020 9:00 A M Medical Record Number: [id number] Patient Account Number: [id number] Date of Birth/Sex: Treating RN: May 27, 1961 (60 y.o. [name], [name] Primary Care Provider: [name] Other Clinician: Referring Provider: Treating Provider/Extender: [name] in Treatment: 10 Active Problems ICD-10 Encounter Code Description Active Date MDM Diagnosis E11.621 Type 2 diabetes mellitus with foot ulcer 09/14/2020 No Yes L97.524 Non-pressure chronic ulcer  of other part of left foot with necrosis of bone 11/15/2020 No Yes E11.40 Type 2 diabetes mellitus with diabetic neuropathy, unspecified 09/14/2020 No Yes I10 Essential (primary) hypertension 09/14/2020 No Yes L03.116 Cellulitis of left lower limb 10/20/2020 No Yes Inactive Problems ICD-10 Code Description Active Date Inactive Date L97.512 Non-pressure chronic ulcer of other part of right foot with fat layer exposed 09/14/2020 09/14/2020 Resolved Problems Electronic Signature(s) Signed: 11/29/2020  4:57:40 PM By: Linton Ham MD Entered By: Linton Ham on 11/29/2020 09:42:34 -------------------------------------------------------------------------------- Progress Note Details Patient Name: Date of Service: Robert Huang, [name] 11/29/2020 9:00 A M Medical Record Number: [id number] Patient Account Number: [id number] Date of Birth/Sex: Treating RN: 1961-08-14 (60 y.o. [name], [name] Primary Care Provider: [name] Other Clinician: Referring Provider: Treating Provider/Extender: [name] in Treatment: 10 Subjective History of Present Illness (HPI) ADMISSION 05/27/2020 This is a 60 year old man who has type 2 diabetes who is here for wounds on his left foot in 2 locations. The patient has had problems with foot ulcers requiring surgery including a right great toe amputation and a left fifth ray amputation in July 2020 for underlying osteomyelitis although strangely I cannot see any pathology or cultures done in this area. He states that the area on his left fourth met head plantar aspect opened about a month ago. He has had continuous callus in this area. He saw Dr. Sarajane Jews his primary doctor at the beginning of September and was felt to have a cellulitis in the lateral part of his foot in this area he was given a shot of Rocephin and then ongoing antibiotics with Keflex for 10 days. It would appear that he was supposed to follow-up with Dr. Sarajane Jews although I do not see these notes. About a week and a half ago he stubbed his left first toe and has a wound on this area. He is using Neosporin to both wound areas. 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  Sarajane Jews ABI in our clinic was 1.23 on the left 10/8; the patient's area over the left fourth metatarsal head is fully closed. This was a split callus last week that I debrided. He still has an open area over the tip of his left great toe. He is using a forefoot offloading and silver alginate. We made him an appointment with [facility] to see about custom made diabetic shoes with inserts this appointment is for next Tuesday. Readmission: 09/14/2020 upon evaluation today patient appears to have several wounds over his left dorsal great toe, left fourth metatarsal head, and right second toe. The good news is none of these appear to be severe at this point. Nonetheless unfortunately he is going require a little bit of the debridement today to try to clear away some of the necrotic debris currently. Specifically the left metatarsal head of the fourth metatarsal does look particularly is a wound that is good to require sharp debridement. The patient does have a history of diabetes mellitus type 2, hypertension, and diabetic neuropathy. He has previously been seen here in the clinic and I am not certain that he is ever really have this completely healed although we have not  seen him since October 2020. Fortunately his ABIs appear to be doing excellent today. 1/27; patient was readmitted to the clinic last week. He had an area over his right second toe which is healed also of the left dorsal great toe is also healed. He attributes these wounds to a hunting trip he took with boots that perhaps were not helpful on his feet. He still has the area on the fourth met head status post left fifth ray amputation in July 20 2/3; left fourth metatarsal head. Wound is about the same size as last week some debris around the circumference of the wound including callus and dry skin I removed this knocked down the circumference hopefully to make the surrounding tissue a little less senescent. Still using silver  collagen. I talked to him about a total contact cast 2/17; 2-week follow-up. Left fourth metatarsal head. He is status post fifth ray amputation in July 2020. I thought we would be able to put a total contact cast on him however he arrives with a dramatic worsening of the wound with increased depth to bone as well as undermining from 6-3 o'clock. Scant amount of purulent drainage which I have cultured. This deterioration this week would obviously preclude a total contact cast. 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 He has a forefoot offloading boot 2/24; culture of this wound that I did last time after registering a fairly dramatic deterioration with exposed bone showed MRSA and Enterococcus faecalis. After some difficulty with insurance and cost of linezolid he is taking that now. T olerating it well. He will definitely need an MRI of the foot and I have ordered this. Using silver alginate on the wound 10/31/2020 upon evaluation today patient appears to be doing well with regard to his wound. Fortunately there is no signs of active infection at this time. In fact I feel like that the the linezolid did a great job for him. Overall I am very pleased in that regard. 3/14; I have not seen this patient in 3 weeks. The purulent drainage I cultured showed MRSA and Enterococcus on 2/17. We gave him linezolid but only for a week this was not renewed last week. I have reviewed his MRI of the foot that did not show osteomyelitis nevertheless he has a wide swath of bone which is his fourth metatarsal head. Using Iodoflex but I change this to silver alginate 3/22; patient has completed antibiotics. Culture I did last week which was a bone scraping with negative. His previous MRI did not show osteomyelitis myelitis. Nevertheless he has a wide swath of bone over the fourth metatarsal head. He had a  dramatic deterioration in this wound in late February with cultures showing MRSA and Enterococcus we gave him line escalated he is responded well. 3/29; this is a patient who came in with several wounds on his left foot the only 1 remaining is the left fourth metatarsal head. This initially looked satisfactory but deteriorated quite markedly in late February. Cultures at that time showed MRSA and Enterococcus I gave him a line escalated this seemed to have helped. His MRI did not support osteomyelitis however after this infection he developed an open area medially in the wound that goes to bone. Follow-up bone scraping of this area was negative I put him in a total contact cast last week to see if we  can support some granulation. We are using Prisma as the primary dressing I had a quick look at the wound today. He was predominantly here for his first total contact cast change. It certainly is no better still the wide area medially with exposed bone. There is no purulence 4/5; not much change in wound dimensions however the granulation looks better. We are using silver collagen under a total contact cast. The real problem is in the more proximal part of the wound towards the heel still exposed bone and a substantial amount of undermining Objective Constitutional Patient is hypertensive.. Pulse regular and within target range for patient.[name] Respirations regular, non-labored and within target range.. Temperature is normal and within the target range for the patient.[name] Appears in no distress. Vitals Time Taken: 9:07 AM, Height: 72 in, Source: Stated, Weight: 205 lbs, Source: Stated, BMI: 27.8, Temperature: 98.4 F, Pulse: 74 bpm, Respiratory Rate: 18 breaths/min, Blood Pressure: 158/81 mmHg, Capillary Blood Glucose: 154 mg/dl. General Notes: glucose per pt report this am General Notes: Wound exam; healthy looking tissue laterally and distally however proximally there is still an open undermining area  with exposed bone that is palpable to the Q-tip. This does not look to be infected. The granulation looks a little better and a little more vibrant under the total contact cast Integumentary (Hair, Skin) Wound #5 status is Open. Original cause of wound was Shear/Friction. The date acquired was: 08/29/2020. The wound has been in treatment 10 weeks. The wound is located on the Left Metatarsal head fourth. The wound measures 0.9cm length x 1.1cm width x 0.7cm depth; 0.778cm^2 area and 0.544cm^3 volume. There is bone and Fat Layer (Subcutaneous Tissue) exposed. There is no tunneling noted, however, there is undermining starting at 4:00 and ending at 1:00 with a maximum distance of 0.5cm. There is a medium amount of serosanguineous drainage noted. The wound margin is epibole. There is medium (34-66%) red, pink granulation within the wound bed. There is a medium (34-66%) amount of necrotic tissue within the wound bed including Adherent Slough. Assessment Active Problems ICD-10 Type 2 diabetes mellitus with foot ulcer Non-pressure chronic ulcer of other part of left foot with necrosis of bone Type 2 diabetes mellitus with diabetic neuropathy, unspecified Essential (primary) hypertension Cellulitis of left lower limb Procedures Wound #5 Pre-procedure diagnosis of Wound #5 is a Diabetic Wound/Ulcer of the Lower Extremity located on the Left Metatarsal head fourth . There was a T Contact otal Cast Procedure by Ricard Dillon., MD. Post procedure Diagnosis Wound #5: Same as Pre-Procedure Plan Follow-up Appointments: Return Appointment in 1 week. Bathing/ Shower/ Hygiene: May shower with protection but do not get wound dressing(s) wet. - [facility] CAN NOT GET WET. Edema Control - Lymphedema / SCD / Other: Elevate legs to the level of the heart or above for 30 minutes daily and/or when sitting, a frequency of: Avoid standing for long periods of time. Off-Loading: T Contact Cast to Left Lower  Extremity otal Non Wound Condition: Protect area with: - pad with foam or gauze to right 2nd toe. Pad shin area with foam WOUND #5: - Metatarsal head fourth Wound Laterality: Left Cleanser: Soap and Water Every Other Day/7 Days Discharge Instructions: May shower and wash wound with dial antibacterial soap and water prior to dressing change. Prim Dressing: Promogran Prisma Matrix, 4.34 (sq in) (silver collagen) Every Other Day/7 Days ary Discharge Instructions: Moisten collagen with saline or hydrogel Secondary Dressing: Woven Gauze Sponges 2x2 in Every Other Day/7 Days Discharge Instructions:  Apply over primary dressing as directed. Secondary Dressing: Optifoam Non-Adhesive Dressing, 4x4 in Every Other Day/7 Days Discharge Instructions: Apply over primary dressing cut to make foam donut Secured With: Conforming Stretch Gauze Bandage, Sterile 2x75 (in/in) Every Other Day/7 Days Discharge Instructions: Secure with stretch gauze as directed. Secured With: Paper Tape, 2x10 (in/yd) Every Other Day/7 Days Discharge Instructions: Secure dressing with tape as directed. #1 still going to use silver collagen under the total contact cast 2. Padding with gauze 3. We went over several options. We did attempt to get some Dermagraft however he has $224 per application co-pay. 8 treatments of $2000 would bankrupted him. He wondered about 4 applications all talk about this next time. I also had some thoughts about Regrannex but this is apparently off the market. 4. I would like to see if we can get some granulation tissue over the bone. Electronic Signature(s) Signed: 11/29/2020 4:57:40 PM By: Linton Ham MD Entered By: Linton Ham on 11/29/2020 09:46:36 -------------------------------------------------------------------------------- Total Contact Cast Details Patient Name: Date of Service: Robert Huang, [name] 11/29/2020 9:00 A M Medical Record Number: [id number] Patient Account Number:  [id number] Date of Birth/Sex: Treating RN: 05/30/1961 (60 y.o. [name], [name] Primary Care Provider: [name] Other Clinician: Referring Provider: Treating Provider/Extender: [name] in Treatment: 10 T Contact Cast Applied for Wound Assessment: otal Wound #5 Left Metatarsal head fourth Performed By: Physician Ricard Dillon., MD Post Procedure Diagnosis Same as Pre-procedure Electronic Signature(s) Signed: 11/29/2020 4:57:40 PM By: Linton Ham MD Entered By: Linton Ham on 11/29/2020 09:42:51 -------------------------------------------------------------------------------- SuperBill Details Patient Name: Date of Service: Robert Huang, Keane Scrape 11/29/2020 Medical Record Number: [id number] Patient Account Number: [id number] Date of Birth/Sex: Treating RN: 05-26-1961 (60 y.o. [name], [name] Primary Care Provider: [name] Other Clinician: Referring Provider: Treating Provider/Extender: [name] in Treatment: 10 Diagnosis Coding ICD-10 Codes Code Description [phone] Type 2 diabetes mellitus with foot ulcer L97.522 Non-pressure chronic ulcer of other part of left foot with fat layer exposed L97.512 Non-pressure chronic ulcer of other part of right foot with fat layer exposed E11.40 Type 2 diabetes mellitus with diabetic neuropathy, unspecified I10 Essential (primary) hypertension L03.116 Cellulitis of left lower limb Facility Procedures CPT4 Code: 03013143 2 Description: 9445 - APPLY TOTAL CONTACT LEG CAST ICD-10 Diagnosis Description L97.522 Non-pressure chronic ulcer of other part of left foot with fat layer exposed Modifier: Quantity: 1 Physician Procedures : CPT4 Code Description Modifier [id number] - WC PHYS APPLY TOTAL CONTACT CAST ICD-10 Diagnosis Description L97.522 Non-pressure chronic ulcer of other part of left foot with fat layer exposed Quantity: 1 Electronic  Signature(s) Signed: 11/29/2020 4:57:40 PM By: Linton Ham MD Entered By: Linton Ham on 11/29/2020 09:46:45

## 2020-12-01 NOTE — Progress Notes (Signed)
TREMEL, SETTERS T ([id number]) . Visit Report for 11/29/2020 Arrival Information Details Patient Name: Date of Service: Devin Going 11/29/2020 9:00 A M Medical Record Number: [id number] Patient Account Number: [id number] Date of Birth/Sex: Treating RN: 10-02-1960 (60 y.o. [name] Primary Care Teyona Nichelson: [name] Other Clinician: Referring Indiana Pechacek: Treating Kynzie Polgar/Extender: Doran Stabler in Treatment: 10 Visit Information History Since Last Visit Added or deleted any medications: No Patient Arrived: Ambulatory Any new allergies or adverse reactions: No Arrival Time: 09:04 Had a fall or experienced change in No Accompanied By: self activities of daily living that may affect Transfer Assistance: None risk of falls: Patient Identification Verified: Yes Signs or symptoms of abuse/neglect since last visito No Secondary Verification Process Completed: Yes Hospitalized since last visit: No Patient Requires Transmission-Based Precautions: No Implantable device outside of the clinic excluding No Patient Has Alerts: Yes cellular tissue based products placed in the center Patient Alerts: R ABI: 0.96 since last visit: L ABI: 1.19 Has Dressing in Place as Prescribed: Yes Has Footwear/Offloading in Place as Prescribed: Yes Left: T Contact Cast otal Pain Present Now: No Electronic Signature(s) Signed: 11/29/2020 5:05:08 PM By: [name] RN, BSN Entered By: [name] on 11/29/2020 09:05:26 -------------------------------------------------------------------------------- Encounter Discharge Information Details Patient Name: Date of Service: [name], [name] 11/29/2020 9:00 A M Medical Record Number: [id number] Patient Account Number: [id number] Date of Birth/Sex: Treating RN: 08/05/1961 (60 y.o. Lytle Michaels Primary Care Hermen Mario: [name] Other Clinician: Referring Saivon Prowse: Treating Mescal Flinchbaugh/Extender: Doran Stabler in Treatment: 10 Encounter Discharge Information Items Discharge Condition: Stable Ambulatory Status: Ambulatory Discharge Destination: Home Transportation: Private Auto Schedule Follow-up Appointment: Yes Clinical Summary of Care: Provided on 11/29/2020 Form Type Recipient Paper Patient Patient Electronic Signature(s) Signed: 11/29/2020 9:32:52 AM By: Antonieta Iba Entered By: Antonieta Iba on 11/29/2020 09:32:52 -------------------------------------------------------------------------------- Lower Extremity Assessment Details Patient Name: Date of Service: [name], [name] 11/29/2020 9:00 A M Medical Record Number: [id number] Patient Account Number: [id number] Date of Birth/Sex: Treating RN: [birth year] (60 y.o. [name] Primary Care Calub Tarnow: [name] Other Clinician: Referring Wayland Baik: Treating Dewaine Morocho/Extender: Doran Stabler in Treatment: 10 Edema Assessment Assessed: Kyra Searles: No] Franne Forts: No] Edema: [Left: N] [Right: o] Calf Left: Right: Point of Measurement: 35 cm From Medial Instep 36.5 cm Ankle Left: Right: Point of Measurement: 11 cm From Medial Instep 21 cm Vascular Assessment Pulses: Dorsalis Pedis Palpable: [Left:Yes] Electronic Signature(s) Signed: 11/29/2020 5:05:08 PM By: [name] RN, BSN Entered By: [name] on 11/29/2020 09:18:34 -------------------------------------------------------------------------------- Multi Wound Chart Details Patient Name: Date of Service: [name], [name] 11/29/2020 9:00 A M Medical Record Number: [id number] Patient Account Number: [id number] Date of Birth/Sex: Treating RN: [birth year] (59 y.o. Charlean Merl, [name] Primary Care Aryella Besecker: [name] Other Clinician: Referring Keri Veale: Treating Morrill Bomkamp/Extender: Doran Stabler in Treatment: 10 Vital Signs Height(in): 72 Capillary Blood Glucose(mg/dl):  203 Weight(lbs): 559 Pulse(bpm): 74 Body Mass Index(BMI): 28 Blood Pressure(mmHg): 158/81 Temperature(F): 98.4 Respiratory Rate(breaths/min): 18 Photos: [5:No Photos Left Metatarsal head fourth] [N/A:N/A N/A] Wound Location: [5:Shear/Friction] [N/A:N/A] Wounding Event: [5:Diabetic Wound/Ulcer of the Lower] [N/A:N/A] Primary Etiology: [5:Extremity Coronary Artery Disease,] [N/A:N/A] Comorbid History: [5:Hypertension, Type II Diabetes, Gout 08/29/2020] [N/A:N/A] Date Acquired: [5:10] [N/A:N/A] Weeks of Treatment: [5:Open] [N/A:N/A] Wound Status: [5:0.9x1.1x0.7] [N/A:N/A] Measurements L x W x D (cm) [5:0.778] [N/A:N/A] A (cm) : rea [5:0.544] [N/A:N/A] Volume (  cm) : [5:78.00%] [N/A:N/A] % Reduction in A rea: [5:23.10%] [N/A:N/A] % Reduction in Volume: [5:4] Starting Position 1 (o'clock): [5:1] Ending Position 1 (o'clock): [5:0.5] Maximum Distance 1 (cm): [5:Yes] [N/A:N/A] Undermining: [5:Grade 2] [N/A:N/A] Classification: [5:Medium] [N/A:N/A] Exudate A mount: [5:Serosanguineous] [N/A:N/A] Exudate Type: [5:red, brown] [N/A:N/A] Exudate Color: [5:Epibole] [N/A:N/A] Wound Margin: [5:Medium (34-66%)] [N/A:N/A] Granulation A mount: [5:Red, Pink] [N/A:N/A] Granulation Quality: [5:Medium (34-66%)] [N/A:N/A] Necrotic A mount: [5:Fat Layer (Subcutaneous Tissue): Yes N/A] Exposed Structures: [5:Bone: Yes Fascia: No Tendon: No Muscle: No Joint: No None] [N/A:N/A] Epithelialization: [5:T Contact Cast otal] [N/A:N/A] Treatment Notes Wound #5 (Metatarsal head fourth) Wound Laterality: Left Cleanser Soap and Water Discharge Instruction: May shower and wash wound with dial antibacterial soap and water prior to dressing change. Peri-Wound Care Topical Primary Dressing Promogran Prisma Matrix, 4.34 (sq in) (silver collagen) Discharge Instruction: Moisten collagen with saline or hydrogel Secondary Dressing Woven Gauze Sponges 2x2 in Discharge Instruction: Apply over primary dressing as  directed. Optifoam Non-Adhesive Dressing, 4x4 in Discharge Instruction: Apply over primary dressing cut to make foam donut Secured With Conforming Stretch Gauze Bandage, Sterile 2x75 (in/in) Discharge Instruction: Secure with stretch gauze as directed. Paper Tape, 2x10 (in/yd) Discharge Instruction: Secure dressing with tape as directed. Compression Wrap Compression Stockings Add-Ons Electronic Signature(s) Signed: 11/29/2020 4:57:40 PM By: Baltazar Najjar MD Signed: 11/30/2020 5:28:50 PM By: Fonnie Mu RN Entered By: Baltazar Najjar on 11/29/2020 09:42:42 -------------------------------------------------------------------------------- Multi-Disciplinary Care Plan Details Patient Name: Date of Service: [name], [name] 11/29/2020 9:00 A M Medical Record Number: [id number] Patient Account Number: [id number] Date of Birth/Sex: Treating RN: 03/06/1961 (59 y.o. Charlean Merl, [name] Primary Care Milicent Acheampong: [name] Other Clinician: Referring Lynn Recendiz: Treating Deyci Gesell/Extender: Doran Stabler in Treatment: 10 Multidisciplinary Care Plan reviewed with physician Active Inactive Wound/Skin Impairment Nursing Diagnoses: Impaired tissue integrity Knowledge deficit related to ulceration/compromised skin integrity Goals: Patient/caregiver will verbalize understanding of skin care regimen Date Initiated: 09/14/2020 Target Resolution Date: 12/11/2020 Goal Status: Active Ulcer/skin breakdown will have a volume reduction of 30% by week 4 Date Initiated: 09/14/2020 Date Inactivated: 10/20/2020 Target Resolution Date: 10/12/2020 Unmet Reason: see wound Goal Status: Unmet measurements. Interventions: Assess patient/caregiver ability to obtain necessary supplies Assess patient/caregiver ability to perform ulcer/skin care regimen upon admission and as needed Assess ulceration(s) every visit Treatment Activities: Topical wound management initiated :  09/14/2020 Notes: Electronic Signature(s) Signed: 11/30/2020 5:28:50 PM By: Fonnie Mu RN Entered By: Fonnie Mu on 11/29/2020 09:28:39 -------------------------------------------------------------------------------- Pain Assessment Details Patient Name: Date of Service: [name], [name] 11/29/2020 9:00 A M Medical Record Number: [id number] Patient Account Number: [id number] Date of Birth/Sex: Treating RN: [birth year] (60 y.o. [name] Primary Care Keifer Habib: [name] Other Clinician: Referring Aaryanna Hyden: Treating Furman Trentman/Extender: Doran Stabler in Treatment: 10 Active Problems Location of Pain Severity and Description of Pain Patient Has Paino No Site Locations Rate the pain. Rate the pain. Current Pain Level: 0 Pain Management and Medication Current Pain Management: Electronic Signature(s) Signed: 11/29/2020 5:05:08 PM By: [name] RN, BSN Entered By: [name] on 11/29/2020 09:07:58 -------------------------------------------------------------------------------- Patient/Caregiver Education Details Patient Name: Date of Service: Devin Going 4/5/2022andnbsp9:00 A M Medical Record Number: [id number] Patient Account Number: [id number] Date of Birth/Gender: Treating RN: [birth year] (60 y.o. Lucious Groves Primary Care Physician: [name] Other Clinician: Referring Physician: Treating Physician/Extender: Doran Stabler in Treatment: 10 Education Assessment Education Provided To: Patient Education Topics  Provided Wound/Skin Impairment: Methods: Explain/Verbal Responses: State content correctly Electronic Signature(s) Signed: 11/30/2020 5:28:50 PM By: Fonnie Mu RN Entered By: Fonnie Mu on 11/29/2020 09:28:55 -------------------------------------------------------------------------------- Wound Assessment Details Patient Name: Date of  Service: [name], [name] 11/29/2020 9:00 A M Medical Record Number: [id number] Patient Account Number: [id number] Date of Birth/Sex: Treating RN: 01/03/1961 (60 y.o. [name] Primary Care Raguel Kosloski: [name] Other Clinician: Referring Kaiyana Bedore: Treating Aryianna Earwood/Extender: Doran Stabler in Treatment: 10 Wound Status Wound Number: 5 Primary Diabetic Wound/Ulcer of the Lower Extremity Etiology: Wound Location: Left Metatarsal head fourth Wound Status: Open Wounding Event: Shear/Friction Comorbid Coronary Artery Disease, Hypertension, Type II Diabetes, Date Acquired: 08/29/2020 History: Gout Weeks Of Treatment: 10 Clustered Wound: No Photos Wound Measurements Length: (cm) 0.9 Width: (cm) 1.1 Depth: (cm) 0.7 Area: (cm) 0.778 Volume: (cm) 0.544 % Reduction in Area: 78% % Reduction in Volume: 23.1% Epithelialization: None Tunneling: No Undermining: Yes Starting Position (o'clock): 4 Ending Position (o'clock): 1 Maximum Distance: (cm) 0.5 Wound Description Classification: Grade 2 Wound Margin: Epibole Exudate Amount: Medium Exudate Type: Serosanguineous Exudate Color: red, brown Foul Odor After Cleansing: No Slough/Fibrino Yes Wound Bed Granulation Amount: Medium (34-66%) Exposed Structure Granulation Quality: Red, Pink Fascia Exposed: No Necrotic Amount: Medium (34-66%) Fat Layer (Subcutaneous Tissue) Exposed: Yes Necrotic Quality: Adherent Slough Tendon Exposed: No Muscle Exposed: No Joint Exposed: No Bone Exposed: Yes Treatment Notes Wound #5 (Metatarsal head fourth) Wound Laterality: Left Cleanser Soap and Water Discharge Instruction: May shower and wash wound with dial antibacterial soap and water prior to dressing change. Peri-Wound Care Topical Primary Dressing Promogran Prisma Matrix, 4.34 (sq in) (silver collagen) Discharge Instruction: Moisten collagen with saline or hydrogel Secondary Dressing Woven Gauze  Sponges 2x2 in Discharge Instruction: Apply over primary dressing as directed. Optifoam Non-Adhesive Dressing, 4x4 in Discharge Instruction: Apply over primary dressing cut to make foam donut Secured With Conforming Stretch Gauze Bandage, Sterile 2x75 (in/in) Discharge Instruction: Secure with stretch gauze as directed. Paper Tape, 2x10 (in/yd) Discharge Instruction: Secure dressing with tape as directed. Compression Wrap Compression Stockings Add-Ons Electronic Signature(s) Signed: 11/29/2020 5:05:08 PM By: [name] RN, BSN Signed: 12/01/2020 4:58:23 PM By: Karl Ito Entered By: Karl Ito on 11/29/2020 16:18:53 -------------------------------------------------------------------------------- Vitals Details Patient Name: Date of Service: [name], [name] 11/29/2020 9:00 A M Medical Record Number: [id number] Patient Account Number: [id number] Date of Birth/Sex: Treating RN: [birth year] (59 y.o. [name] Primary Care Dhruvi Crenshaw: [name] Other Clinician: Referring Dink Creps: Treating Kylor Valverde/Extender: Doran Stabler in Treatment: 10 Vital Signs Time Taken: 09:07 Temperature (F): 98.4 Height (in): 72 Pulse (bpm): 74 Source: Stated Respiratory Rate (breaths/min): 18 Weight (lbs): 205 Blood Pressure (mmHg): 158/81 Source: Stated Capillary Blood Glucose (mg/dl): 706 Body Mass Index (BMI): 27.8 Reference Range: 80 - 120 mg / dl Notes glucose per pt report this am Electronic Signature(s) Signed: 11/29/2020 5:05:08 PM By: [name] RN, BSN Entered By: [name] on 11/29/2020 09:07:48

## 2020-12-06 ENCOUNTER — Other Ambulatory Visit: Payer: Self-pay

## 2020-12-06 ENCOUNTER — Encounter (HOSPITAL_BASED_OUTPATIENT_CLINIC_OR_DEPARTMENT_OTHER): Payer: 59 | Admitting: Internal Medicine

## 2020-12-06 ENCOUNTER — Other Ambulatory Visit (HOSPITAL_COMMUNITY)
Admission: RE | Admit: 2020-12-06 | Discharge: 2020-12-06 | Disposition: A | Discharge status: Home / Self Care | Payer: 59 | Attending: Internal Medicine | Admitting: Internal Medicine

## 2020-12-06 DIAGNOSIS — L97524 Non-pressure chronic ulcer of other part of left foot with necrosis of bone: Secondary | ICD-10-CM | POA: Diagnosis not present

## 2020-12-06 DIAGNOSIS — L03116 Cellulitis of left lower limb: Secondary | ICD-10-CM | POA: Diagnosis not present

## 2020-12-06 DIAGNOSIS — E11621 Type 2 diabetes mellitus with foot ulcer: Secondary | ICD-10-CM | POA: Insufficient documentation

## 2020-12-06 NOTE — Progress Notes (Signed)
KHALEB, BROZ T ([id number]) . Visit Report for 12/06/2020 HPI Details Patient Name: Date of Service: Robert Huang 12/06/2020 12:30 PM Medical Record Number: [id number] Patient Account Number: [id number] Date of Birth/Sex: Treating RN: [birth year] (60 y.o. Robert Huang, Robert Huang Primary Care Provider: [name] Other Clinician: Referring Provider: Treating Provider/Extender: Robert Huang Kitchen in Treatment: 11 History of Present Illness HPI Description: ADMISSION 05/27/2020 This is a 60 year old man who has type 2 diabetes who is here for wounds on his left foot in 2 locations. The patient has had problems with foot ulcers requiring surgery including a right great toe amputation and a left fifth ray amputation in July 2020 for underlying osteomyelitis although strangely I cannot see any pathology or cultures done in this area. He states that the area on his left fourth met head plantar aspect opened about a month ago. He has had continuous callus in this area. He saw Dr. Sarajane Jews his primary doctor at the beginning of September and was felt to have a cellulitis in the lateral part of his foot in this area he was given a shot of Rocephin and then ongoing antibiotics with Keflex for 10 days. It would appear that he was supposed to follow-up with Dr. Sarajane Jews although I do not see these notes. About a week and a half ago he stubbed his left first toe and has a wound on this area. He is using Neosporin to both wound areas. 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 Sarajane Jews ABI in our clinic was 1.23 on the left 10/8; the patient's area over the left fourth metatarsal head is fully closed.  This was a split callus last week that I debrided. He still has an open area over the tip of his left great toe. He is using a forefoot offloading and silver alginate. We made him an appointment with [facility] to see about custom made diabetic shoes with inserts this appointment is for next Tuesday. Readmission: 09/14/2020 upon evaluation today patient appears to have several wounds over his left dorsal great toe, left fourth metatarsal head, and right second toe. The good news is none of these appear to be severe at this point. Nonetheless unfortunately he is going require a little bit of the debridement today to try to clear away some of the necrotic debris currently. Specifically the left metatarsal head of the fourth metatarsal does look particularly is a wound that is good to require sharp debridement. The patient does have a history of diabetes mellitus type 2, hypertension, and diabetic neuropathy. He has previously been seen here in the clinic and I am not certain that he is ever really have this completely healed although we have not seen him since October 2020. Fortunately his ABIs appear to be doing excellent today. 1/27; patient was readmitted to the clinic last week. He had an area over his right second toe which is healed also of the left dorsal great toe is also healed. He attributes these wounds to a hunting trip he took with boots that perhaps were not helpful on his feet. He still has the area on the fourth met head status post left fifth ray  amputation in July 20 2/3; left fourth metatarsal head. Wound is about the same size as last week some debris around the circumference of the wound including callus and dry skin I removed this knocked down the circumference hopefully to make the surrounding tissue a little less senescent. Still using silver collagen. I talked to him about a total contact cast 2/17; 2-week follow-up. Left fourth metatarsal head. He is status post fifth  ray amputation in July 2020. I thought we would be able to put a total contact cast on him however he arrives with a dramatic worsening of the wound with increased depth to bone as well as undermining from 6-3 o'clock. Scant amount of purulent drainage which I have cultured. This deterioration this week would obviously preclude a total contact cast. 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 He has a forefoot offloading boot 2/24; culture of this wound that I did last time after registering a fairly dramatic deterioration with exposed bone showed MRSA and Enterococcus faecalis. After some difficulty with insurance and cost of linezolid he is taking that now. T olerating it well. He will definitely need an MRI of the foot and I have ordered this. Using silver alginate on the wound 10/31/2020 upon evaluation today patient appears to be doing well with regard to his wound. Fortunately there is no signs of active infection at this time. In fact I feel like that the the linezolid did a great job for him. Overall I am very pleased in that regard. 3/14; I have not seen this patient in 3 weeks. The purulent drainage I cultured showed MRSA and Enterococcus on 2/17. We gave him linezolid but only for a week this was not renewed last week. I have reviewed his MRI of the foot that did not show osteomyelitis nevertheless he has a wide swath of bone which is his fourth metatarsal head. Using Iodoflex but I change this to silver alginate 3/22; patient has completed antibiotics. Culture I did last week which was a bone scraping with negative. His previous MRI did not show osteomyelitis myelitis. Nevertheless he has a wide swath of bone over the fourth metatarsal head. He had a dramatic deterioration in this wound in late February with cultures showing MRSA and Enterococcus we gave him line escalated he is  responded well. 3/29; this is a patient who came in with several wounds on his left foot the only 1 remaining is the left fourth metatarsal head. This initially looked satisfactory but deteriorated quite markedly in late February. Cultures at that time showed MRSA and Enterococcus I gave him a line escalated this seemed to have helped. His MRI did not support osteomyelitis however after this infection he developed an open area medially in the wound that goes to bone. Follow-up bone scraping of this area was negative I put him in a total contact cast last week to see if we can support some granulation. We are using Prisma as the primary dressing I had a quick look at the wound today. He was predominantly here for his first total contact cast change. It certainly is no better still the wide area medially with exposed bone. There is no purulence 4/5; not much change in wound dimensions however the granulation looks better. We are using silver collagen under a total contact cast. The real problem is in the more proximal part  of the wound towards the heel still exposed bone and a substantial amount of undermining 4/12; arrives in clinic today with everything a lot worse. Purulent drainage erythema on his lateral foot. He says that he had some chills over the weekend but he is not febrile today does not seem to be systemically unwell. Last culture I did of this showed MRSA and Enterococcus. We gave him a course of linezolid. After that I did a bone scraping of this area at the with that was negative. His MRI did not support osteomyelitis in this area. Purulent drainage today erythema on the left lateral foot Electronic Signature(s) Signed: 12/06/2020 4:33:39 PM By: Linton Ham MD Entered By: Linton Ham on 12/06/2020 13:31:54 -------------------------------------------------------------------------------- Physical Exam Details Patient Name: Date of Service: Robert Huang, Robert Arbour T. 12/06/2020 12:30  PM Medical Record Number: [id number] Patient Account Number: [id number] Date of Birth/Sex: Treating RN: Nov 19, 1960 (60 y.o. Robert Huang, Robert Huang Primary Care Provider: [name] Other Clinician: Referring Provider: Treating Provider/Extender: Robert Huang Kitchen in Treatment: 11 Constitutional Sitting or standing Blood Pressure is within target range for patient.. Pulse regular and within target range for patient.Robert Huang Kitchen Respirations regular, non-labored and within target range.. Temperature is normal and within the target range for the patient.Robert Huang Kitchen Appears in no distress. Notes Exam; major deterioration today. Erythema swelling around the wound and into the lateral foot. I have marked this area. Serosanguineous mostly purulent purulent looking drainage. A specimen obtained for culture. The wound is worse close to bone and a wider swath than previously. Electronic Signature(s) Signed: 12/06/2020 4:33:39 PM By: Linton Ham MD Entered By: Linton Ham on 12/06/2020 13:32:52 -------------------------------------------------------------------------------- Physician Orders Details Patient Name: Date of Service: Robert Huang, Robert Arbour T. 12/06/2020 12:30 PM Medical Record Number: [id number] Patient Account Number: [id number] Date of Birth/Sex: Treating RN: Jun 29, 1961 (60 y.o. Robert Huang, Robert Huang Primary Care Provider: [name] Other Clinician: Referring Provider: Treating Provider/Extender: Robert Huang Kitchen in Treatment: 11 Verbal / Phone Orders: No Diagnosis Coding ICD-10 Coding Code Description E11.621 Type 2 diabetes mellitus with foot ulcer L97.524 Non-pressure chronic ulcer of other part of left foot with necrosis of bone E11.40 Type 2 diabetes mellitus with diabetic neuropathy, unspecified I10 Essential (primary) hypertension L03.116 Cellulitis of left lower limb Follow-up Appointments ppointment in: - Friday 12/09/2020 Return A Bathing/  Shower/ Hygiene May shower with protection but do not get wound dressing(s) wet. Edema Control - Lymphedema / SCD / Other Elevate legs to the level of the heart or above for 30 minutes daily and/or when sitting, a frequency of: Avoid standing for long periods of time. Off-Loading Open toe surgical shoe to: - wedge shoe. Additional Orders / Instructions Other: - Pick up antibiotic and start taking today. Go to the Emergency Department if the redness increases passed the line on the foot. Go to ED if fever, pain, odor, or swelling increases. Wound Treatment Wound #5 - Metatarsal head fourth Wound Laterality: Left Prim Dressing: KerraCel Ag Gelling Fiber Dressing, 2x2 in (silver alginate) 1 x Per Day/7 Days ary Discharge Instructions: Apply silver alginate to wound bed as instructed Secondary Dressing: Woven Gauze Sponges 2x2 in 1 x Per Day/7 Days Discharge Instructions: Apply over primary dressing as directed. Secondary Dressing: Optifoam Non-Adhesive Dressing, 4x4 in 1 x Per Day/7 Days Discharge Instructions: Apply over primary dressing cut to make foam donut Secured With: Conforming Stretch Gauze Bandage, Sterile 2x75 (in/in) 1 x Per Day/7 Days Discharge Instructions: Secure with  stretch gauze as directed. Secured With: Paper Tape, 2x10 (in/yd) 1 x Per Day/7 Days Discharge Instructions: Secure dressing with tape as directed. Laboratory naerobe culture (MICRO) - left foot culture - (ICD10 E11.621 - Type 2 diabetes mellitus with foot Bacteria identified in Unspecified specimen by A ulcer) LOINC Code: 937-1 Convenience Name: Anerobic culture Patient Medications llergies: No Known Drug Allergies A Notifications Medication Indication Start End 12/06/2020 linezolid DOSE oral 600 mg tablet - 1 tablet oral bid for 7 days 12/06/2020 Cipro DOSE oral 500 mg tablet - 1 tablet oral bid for 7 days Electronic Signature(s) Signed: 12/06/2020 1:21:28 PM By: Linton Ham MD Entered By: Linton Ham on 12/06/2020 13:21:27 -------------------------------------------------------------------------------- Problem List Details Patient Name: Date of Service: Robert Huang, Robert Arbour T. 12/06/2020 12:30 PM Medical Record Number: [id number] Patient Account Number: [id number] Date of Birth/Sex: Treating RN: [birth year] (60 y.o. Robert Huang, Robert Huang Primary Care Provider: [name] Other Clinician: Referring Provider: Treating Provider/Extender: Robert Huang Kitchen in Treatment: 11 Active Problems ICD-10 Encounter Code Description Active Date MDM Diagnosis E11.621 Type 2 diabetes mellitus with foot ulcer 09/14/2020 No Yes L97.524 Non-pressure chronic ulcer of other part of left foot with necrosis of bone 11/15/2020 No Yes E11.40 Type 2 diabetes mellitus with diabetic neuropathy, unspecified 09/14/2020 No Yes I10 Essential (primary) hypertension 09/14/2020 No Yes L03.116 Cellulitis of left lower limb 10/20/2020 No Yes Inactive Problems ICD-10 Code Description Active Date Inactive Date L97.512 Non-pressure chronic ulcer of other part of right foot with fat layer exposed 09/14/2020 09/14/2020 Resolved Problems Electronic Signature(s) Signed: 12/06/2020 4:33:39 PM By: Linton Ham MD Entered By: Linton Ham on 12/06/2020 13:29:52 -------------------------------------------------------------------------------- Progress Note Details Patient Name: Date of Service: Robert Huang, Robert Arbour T. 12/06/2020 12:30 PM Medical Record Number: [id number] Patient Account Number: [id number] Date of Birth/Sex: Treating RN: [birth year] (59 y.o. Robert Huang, Robert Huang Primary Care Provider: [name] Other Clinician: Referring Provider: Treating Provider/Extender: Robert Huang Kitchen in Treatment: 11 Subjective History of Present Illness (HPI) ADMISSION 05/27/2020 This is a 60 year old man who has type 2 diabetes who is here for wounds on his left foot in 2 locations.  The patient has had problems with foot ulcers requiring surgery including a right great toe amputation and a left fifth ray amputation in July 2020 for underlying osteomyelitis although strangely I cannot see any pathology or cultures done in this area. He states that the area on his left fourth met head plantar aspect opened about a month ago. He has had continuous callus in this area. He saw Dr. Sarajane Jews his primary doctor at the beginning of September and was felt to have a cellulitis in the lateral part of his foot in this area he was given a shot of Rocephin and then ongoing antibiotics with Keflex for 10 days. It would appear that he was supposed to follow-up with Dr. Sarajane Jews although I do not see these notes. About a week and a half ago he stubbed his left first toe and has a wound on this area. He is using Neosporin to both wound areas. 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 Sarajane Jews ABI in our clinic was 1.23 on the left  10/8; the patient's area over the left fourth metatarsal head is fully closed. This was a split callus last week that I debrided. He still has an open area over the tip of his left great toe. He is using a forefoot offloading and silver alginate. We made him an appointment with [facility] to see about custom made diabetic shoes with inserts this appointment is for next Tuesday. Readmission: 09/14/2020 upon evaluation today patient appears to have several wounds over his left dorsal great toe, left fourth metatarsal head, and right second toe. The good news is none of these appear to be severe at this point. Nonetheless unfortunately he is going require a little bit of the debridement  today to try to clear away some of the necrotic debris currently. Specifically the left metatarsal head of the fourth metatarsal does look particularly is a wound that is good to require sharp debridement. The patient does have a history of diabetes mellitus type 2, hypertension, and diabetic neuropathy. He has previously been seen here in the clinic and I am not certain that he is ever really have this completely healed although we have not seen him since October 2020. Fortunately his ABIs appear to be doing excellent today. 1/27; patient was readmitted to the clinic last week. He had an area over his right second toe which is healed also of the left dorsal great toe is also healed. He attributes these wounds to a hunting trip he took with boots that perhaps were not helpful on his feet. He still has the area on the fourth met head status post left fifth ray amputation in July 20 2/3; left fourth metatarsal head. Wound is about the same size as last week some debris around the circumference of the wound including callus and dry skin I removed this knocked down the circumference hopefully to make the surrounding tissue a little less senescent. Still using silver collagen. I talked to him about a total contact cast 2/17; 2-week follow-up. Left fourth metatarsal head. He is status post fifth ray amputation in July 2020. I thought we would be able to put a total contact cast on him however he arrives with a dramatic worsening of the wound with increased depth to bone as well as undermining from 6-3 o'clock. Scant amount of purulent drainage which I have cultured. This deterioration this week would obviously preclude a total contact cast. 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 He has a forefoot offloading boot 2/24; culture of this wound that I did last time after registering a fairly  dramatic deterioration with exposed bone showed MRSA and Enterococcus faecalis. After some difficulty with insurance and cost of linezolid he is taking that now. T olerating it well. He will definitely need an MRI of the foot and I have ordered this. Using silver alginate on the wound 10/31/2020 upon evaluation today patient appears to be doing well with regard to his wound. Fortunately there is no signs of active infection at this time. In fact I feel like that the the linezolid did a great job for him. Overall I am very pleased in that regard. 3/14; I have not seen this patient in 3 weeks. The purulent drainage I cultured showed MRSA and Enterococcus on 2/17. We gave him linezolid but only for a week this was not renewed last week. I have  reviewed his MRI of the foot that did not show osteomyelitis nevertheless he has a wide swath of bone which is his fourth metatarsal head. Using Iodoflex but I change this to silver alginate 3/22; patient has completed antibiotics. Culture I did last week which was a bone scraping with negative. His previous MRI did not show osteomyelitis myelitis. Nevertheless he has a wide swath of bone over the fourth metatarsal head. He had a dramatic deterioration in this wound in late February with cultures showing MRSA and Enterococcus we gave him line escalated he is responded well. 3/29; this is a patient who came in with several wounds on his left foot the only 1 remaining is the left fourth metatarsal head. This initially looked satisfactory but deteriorated quite markedly in late February. Cultures at that time showed MRSA and Enterococcus I gave him a line escalated this seemed to have helped. His MRI did not support osteomyelitis however after this infection he developed an open area medially in the wound that goes to bone. Follow-up bone scraping of this area was negative I put him in a total contact cast last week to see if we can support some granulation. We are using  Prisma as the primary dressing I had a quick look at the wound today. He was predominantly here for his first total contact cast change. It certainly is no better still the wide area medially with exposed bone. There is no purulence 4/5; not much change in wound dimensions however the granulation looks better. We are using silver collagen under a total contact cast. The real problem is in the more proximal part of the wound towards the heel still exposed bone and a substantial amount of undermining 4/12; arrives in clinic today with everything a lot worse. Purulent drainage erythema on his lateral foot. He says that he had some chills over the weekend but he is not febrile today does not seem to be systemically unwell. Last culture I did of this showed MRSA and Enterococcus. We gave him a course of linezolid. After that I did a bone scraping of this area at the with that was negative. His MRI did not support osteomyelitis in this area. Purulent drainage today erythema on the left lateral foot Objective Constitutional Sitting or standing Blood Pressure is within target range for patient.. Pulse regular and within target range for patient.Robert Huang Kitchen Respirations regular, non-labored and within target range.. Temperature is normal and within the target range for the patient.Robert Huang Kitchen Appears in no distress. Vitals Time Taken: 12:43 PM, Height: 72 in, Weight: 205 lbs, BMI: 27.8, Temperature: 98.3 F, Pulse: 76 bpm, Respiratory Rate: 18 breaths/min, Blood Pressure: 136/75 mmHg, Capillary Blood Glucose: 144 mg/dl. General Notes: Exam; major deterioration today. Erythema swelling around the wound and into the lateral foot. I have marked this area. Serosanguineous mostly purulent purulent looking drainage. A specimen obtained for culture. The wound is worse close to bone and a wider swath than previously. Integumentary (Hair, Skin) Wound #5 status is Open. Original cause of wound was Shear/Friction. The date acquired  was: 08/29/2020. The wound has been in treatment 11 weeks. The wound is located on the Left Metatarsal head fourth. The wound measures 0.7cm length x 0.9cm width x 0.4cm depth; 0.495cm^2 area and 0.198cm^3 volume. There is bone and Fat Layer (Subcutaneous Tissue) exposed. There is no tunneling noted, however, there is undermining starting at 12:00 and ending at 6:00 with a maximum distance of 1cm. There is a medium amount of purulent drainage noted. The  wound margin is epibole. There is large (67-100%) pink granulation within the wound bed. There is a small (1-33%) amount of necrotic tissue within the wound bed including Adherent Slough. Assessment Active Problems ICD-10 Type 2 diabetes mellitus with foot ulcer Non-pressure chronic ulcer of other part of left foot with necrosis of bone Type 2 diabetes mellitus with diabetic neuropathy, unspecified Essential (primary) hypertension Cellulitis of left lower limb Plan Follow-up Appointments: Return Appointment in: - Friday 12/09/2020 Bathing/ Shower/ Hygiene: May shower with protection but do not get wound dressing(s) wet. Edema Control - Lymphedema / SCD / Other: Elevate legs to the level of the heart or above for 30 minutes daily and/or when sitting, a frequency of: Avoid standing for long periods of time. Off-Loading: Open toe surgical shoe to: - wedge shoe. Additional Orders / Instructions: Other: - Pick up antibiotic and start taking today. Go to the Emergency Department if the redness increases passed the line on the foot. Go to ED if fever, pain, odor, or swelling increases. Laboratory ordered were: Anerobic culture - left foot culture The following medication(s) was prescribed: linezolid oral 600 mg tablet 1 tablet oral bid for 7 days starting 12/06/2020 Cipro oral 500 mg tablet 1 tablet oral bid for 7 days starting 12/06/2020 WOUND #5: - Metatarsal head fourth Wound Laterality: Left Prim Dressing: KerraCel Ag Gelling Fiber Dressing,  2x2 in (silver alginate) 1 x Per Day/7 Days ary Discharge Instructions: Apply silver alginate to wound bed as instructed Secondary Dressing: Woven Gauze Sponges 2x2 in 1 x Per Day/7 Days Discharge Instructions: Apply over primary dressing as directed. Secondary Dressing: Optifoam Non-Adhesive Dressing, 4x4 in 1 x Per Day/7 Days Discharge Instructions: Apply over primary dressing cut to make foam donut Secured With: Conforming Stretch Gauze Bandage, Sterile 2x75 (in/in) 1 x Per Day/7 Days Discharge Instructions: Secure with stretch gauze as directed. Secured With: Paper T ape, 2x10 (in/yd) 1 x Per Day/7 Days Discharge Instructions: Secure dressing with tape as directed. 1. Empiric linezolid I have added ciprofloxacin for gram-negative coverage 2. If this turns out to be MRSA again he will need a more prolonged course of treatment. May actually need to send him to infectious disease. Although I have been unable to prove that he has osteomyelitis with MRI or culture this certainly looks like this would be the case. Today he looks like he has an underlying abscess 3. I had some thoughts about using Elesa Hacker today however his insurance barely cover the linezolid last time. 4. At some point he is going to need a course of something to cover anaerobes in this area either Augmentin or Flagyl. 5. I changed him to silver alginate forefoot offloading shoe. Advised him to go to the emergency room if the erythema that I have marked in his foot extends or he develops systemic symptoms fever further chills etc. I have asked him to start the antibiotic tonight Electronic Signature(s) Signed: 12/06/2020 4:33:39 PM By: Linton Ham MD Entered By: Linton Ham on 12/06/2020 13:34:58 -------------------------------------------------------------------------------- SuperBill Details Patient Name: Date of Service: Robert Huang, Robert Arbour T. 12/06/2020 Medical Record Number: [id number] Patient Account Number:  [id number] Date of Birth/Sex: Treating RN: 1960-11-27 (60 y.o. [name] Primary Care Provider: [name] Other Clinician: Referring Provider: Treating Provider/Extender: Robert Huang Kitchen in Treatment: 11 Diagnosis Coding ICD-10 Codes Code Description [phone] Type 2 diabetes mellitus with foot ulcer L97.524 Non-pressure chronic ulcer of other part of left foot with necrosis of bone E11.40 Type  2 diabetes mellitus with diabetic neuropathy, unspecified I10 Essential (primary) hypertension L03.116 Cellulitis of left lower limb Facility Procedures CPT4 Code: [id number] Description: 99213 - WOUND CARE VISIT-LEV 3 EST PT Modifier: Quantity: 1 Physician Procedures : CPT4 Code Description Modifier [id number] 11031 - WC PHYS LEVEL 4 - EST PT ICD-10 Diagnosis Description L03.116 Cellulitis of left lower limb E11.621 Type 2 diabetes mellitus with foot ulcer L97.524 Non-pressure chronic ulcer of other part of left foot  with necrosis of bone Quantity: 1 Electronic Signature(s) Signed: 12/06/2020 4:33:39 PM By: Linton Ham MD Entered By: Linton Ham on 12/06/2020 13:35:18

## 2020-12-08 ENCOUNTER — Encounter (HOSPITAL_COMMUNITY): Payer: Self-pay

## 2020-12-08 ENCOUNTER — Emergency Department (HOSPITAL_COMMUNITY): Payer: 59

## 2020-12-08 ENCOUNTER — Inpatient Hospital Stay (HOSPITAL_COMMUNITY): Payer: 59

## 2020-12-08 ENCOUNTER — Inpatient Hospital Stay (HOSPITAL_COMMUNITY)
Admission: EM | Admit: 2020-12-08 | Discharge: 2020-12-11 | DRG: 617 | Disposition: A | Discharge status: Home Health | Payer: 59 | Attending: Internal Medicine | Admitting: Internal Medicine

## 2020-12-08 DIAGNOSIS — Z955 Presence of coronary angioplasty implant and graft: Secondary | ICD-10-CM | POA: Diagnosis not present

## 2020-12-08 DIAGNOSIS — Z89422 Acquired absence of other left toe(s): Secondary | ICD-10-CM

## 2020-12-08 DIAGNOSIS — E785 Hyperlipidemia, unspecified: Secondary | ICD-10-CM | POA: Diagnosis present

## 2020-12-08 DIAGNOSIS — M869 Osteomyelitis, unspecified: Secondary | ICD-10-CM

## 2020-12-08 DIAGNOSIS — M86172 Other acute osteomyelitis, left ankle and foot: Secondary | ICD-10-CM | POA: Diagnosis present

## 2020-12-08 DIAGNOSIS — Z87442 Personal history of urinary calculi: Secondary | ICD-10-CM

## 2020-12-08 DIAGNOSIS — E1169 Type 2 diabetes mellitus with other specified complication: Secondary | ICD-10-CM | POA: Diagnosis not present

## 2020-12-08 DIAGNOSIS — Z8249 Family history of ischemic heart disease and other diseases of the circulatory system: Secondary | ICD-10-CM

## 2020-12-08 DIAGNOSIS — I10 Essential (primary) hypertension: Secondary | ICD-10-CM | POA: Diagnosis present

## 2020-12-08 DIAGNOSIS — Z89432 Acquired absence of left foot: Secondary | ICD-10-CM

## 2020-12-08 DIAGNOSIS — D649 Anemia, unspecified: Secondary | ICD-10-CM | POA: Diagnosis present

## 2020-12-08 DIAGNOSIS — Z20822 Contact with and (suspected) exposure to covid-19: Secondary | ICD-10-CM | POA: Diagnosis present

## 2020-12-08 DIAGNOSIS — E11628 Type 2 diabetes mellitus with other skin complications: Secondary | ICD-10-CM | POA: Diagnosis not present

## 2020-12-08 DIAGNOSIS — F411 Generalized anxiety disorder: Secondary | ICD-10-CM | POA: Diagnosis present

## 2020-12-08 DIAGNOSIS — E11621 Type 2 diabetes mellitus with foot ulcer: Secondary | ICD-10-CM | POA: Diagnosis not present

## 2020-12-08 DIAGNOSIS — E876 Hypokalemia: Secondary | ICD-10-CM | POA: Diagnosis present

## 2020-12-08 DIAGNOSIS — Z7984 Long term (current) use of oral hypoglycemic drugs: Secondary | ICD-10-CM

## 2020-12-08 DIAGNOSIS — G8918 Other acute postprocedural pain: Secondary | ICD-10-CM | POA: Diagnosis not present

## 2020-12-08 DIAGNOSIS — L089 Local infection of the skin and subcutaneous tissue, unspecified: Secondary | ICD-10-CM | POA: Diagnosis present

## 2020-12-08 DIAGNOSIS — F17220 Nicotine dependence, chewing tobacco, uncomplicated: Secondary | ICD-10-CM | POA: Diagnosis present

## 2020-12-08 DIAGNOSIS — E039 Hypothyroidism, unspecified: Secondary | ICD-10-CM | POA: Diagnosis present

## 2020-12-08 DIAGNOSIS — E118 Type 2 diabetes mellitus with unspecified complications: Secondary | ICD-10-CM | POA: Diagnosis not present

## 2020-12-08 DIAGNOSIS — M109 Gout, unspecified: Secondary | ICD-10-CM | POA: Diagnosis present

## 2020-12-08 DIAGNOSIS — L03116 Cellulitis of left lower limb: Secondary | ICD-10-CM | POA: Diagnosis not present

## 2020-12-08 DIAGNOSIS — L97529 Non-pressure chronic ulcer of other part of left foot with unspecified severity: Secondary | ICD-10-CM | POA: Diagnosis not present

## 2020-12-08 DIAGNOSIS — Z79890 Hormone replacement therapy (postmenopausal): Secondary | ICD-10-CM | POA: Diagnosis not present

## 2020-12-08 DIAGNOSIS — X58XXXA Exposure to other specified factors, initial encounter: Secondary | ICD-10-CM | POA: Diagnosis present

## 2020-12-08 DIAGNOSIS — Z89411 Acquired absence of right great toe: Secondary | ICD-10-CM

## 2020-12-08 DIAGNOSIS — I2510 Atherosclerotic heart disease of native coronary artery without angina pectoris: Secondary | ICD-10-CM | POA: Diagnosis present

## 2020-12-08 DIAGNOSIS — Z823 Family history of stroke: Secondary | ICD-10-CM | POA: Diagnosis not present

## 2020-12-08 DIAGNOSIS — Z7982 Long term (current) use of aspirin: Secondary | ICD-10-CM

## 2020-12-08 DIAGNOSIS — Z9889 Other specified postprocedural states: Secondary | ICD-10-CM | POA: Diagnosis not present

## 2020-12-08 DIAGNOSIS — M19072 Primary osteoarthritis, left ankle and foot: Secondary | ICD-10-CM | POA: Diagnosis not present

## 2020-12-08 DIAGNOSIS — S93125A Dislocation of metatarsophalangeal joint of left lesser toe(s), initial encounter: Secondary | ICD-10-CM | POA: Diagnosis present

## 2020-12-08 DIAGNOSIS — E1151 Type 2 diabetes mellitus with diabetic peripheral angiopathy without gangrene: Secondary | ICD-10-CM | POA: Diagnosis present

## 2020-12-08 DIAGNOSIS — E538 Deficiency of other specified B group vitamins: Secondary | ICD-10-CM | POA: Diagnosis present

## 2020-12-08 DIAGNOSIS — E1140 Type 2 diabetes mellitus with diabetic neuropathy, unspecified: Secondary | ICD-10-CM | POA: Diagnosis present

## 2020-12-08 DIAGNOSIS — Z79899 Other long term (current) drug therapy: Secondary | ICD-10-CM | POA: Diagnosis not present

## 2020-12-08 DIAGNOSIS — Z833 Family history of diabetes mellitus: Secondary | ICD-10-CM

## 2020-12-08 DIAGNOSIS — Z8042 Family history of malignant neoplasm of prostate: Secondary | ICD-10-CM

## 2020-12-08 DIAGNOSIS — M1990 Unspecified osteoarthritis, unspecified site: Secondary | ICD-10-CM | POA: Diagnosis present

## 2020-12-08 LAB — CBC
HCT: 32.9 % — ABNORMAL LOW (ref 39.0–52.0)
Hemoglobin: 10.3 g/dL — ABNORMAL LOW (ref 13.0–17.0)
MCH: 30.1 pg (ref 26.0–34.0)
MCHC: 31.3 g/dL (ref 30.0–36.0)
MCV: 96.2 fL (ref 80.0–100.0)
Platelets: 347 10*3/uL (ref 150–400)
RBC: 3.42 MIL/uL — ABNORMAL LOW (ref 4.22–5.81)
RDW: 15.1 % (ref 11.5–15.5)
WBC: 6.1 10*3/uL (ref 4.0–10.5)
nRBC: 0 % (ref 0.0–0.2)

## 2020-12-08 LAB — CBC WITH DIFFERENTIAL/PLATELET
Abs Immature Granulocytes: 0.04 10*3/uL (ref 0.00–0.07)
Basophils Absolute: 0.1 10*3/uL (ref 0.0–0.1)
Basophils Relative: 1 %
Eosinophils Absolute: 0.2 10*3/uL (ref 0.0–0.5)
Eosinophils Relative: 3 %
HCT: 33.4 % — ABNORMAL LOW (ref 39.0–52.0)
Hemoglobin: 10.5 g/dL — ABNORMAL LOW (ref 13.0–17.0)
Immature Granulocytes: 1 %
Lymphocytes Relative: 9 %
Lymphs Abs: 0.6 10*3/uL — ABNORMAL LOW (ref 0.7–4.0)
MCH: 29.7 pg (ref 26.0–34.0)
MCHC: 31.4 g/dL (ref 30.0–36.0)
MCV: 94.4 fL (ref 80.0–100.0)
Monocytes Absolute: 0.7 10*3/uL (ref 0.1–1.0)
Monocytes Relative: 10 %
Neutro Abs: 5.5 10*3/uL (ref 1.7–7.7)
Neutrophils Relative %: 76 %
Platelets: 321 10*3/uL (ref 150–400)
RBC: 3.54 MIL/uL — ABNORMAL LOW (ref 4.22–5.81)
RDW: 14.8 % (ref 11.5–15.5)
WBC: 7.2 10*3/uL (ref 4.0–10.5)
nRBC: 0 % (ref 0.0–0.2)

## 2020-12-08 LAB — AEROBIC CULTURE W GRAM STAIN (SUPERFICIAL SPECIMEN)

## 2020-12-08 LAB — COMPREHENSIVE METABOLIC PANEL
ALT: 14 U/L (ref 0–44)
AST: 16 U/L (ref 15–41)
Albumin: 3 g/dL — ABNORMAL LOW (ref 3.5–5.0)
Alkaline Phosphatase: 56 U/L (ref 38–126)
Anion gap: 11 (ref 5–15)
BUN: 14 mg/dL (ref 6–20)
CO2: 20 mmol/L — ABNORMAL LOW (ref 22–32)
Calcium: 8.9 mg/dL (ref 8.9–10.3)
Chloride: 107 mmol/L (ref 98–111)
Creatinine, Ser: 1.27 mg/dL — ABNORMAL HIGH (ref 0.61–1.24)
GFR, Estimated: >60 mL/min (ref >60)
Glucose, Bld: 131 mg/dL — ABNORMAL HIGH (ref 70–99)
Potassium: 4 mmol/L (ref 3.5–5.1)
Sodium: 138 mmol/L (ref 135–145)
Total Bilirubin: 0.5 mg/dL (ref 0.3–1.2)
Total Protein: 7.1 g/dL (ref 6.5–8.1)

## 2020-12-08 LAB — HIV ANTIBODY (ROUTINE TESTING W REFLEX): HIV Screen 4th Generation wRfx: NONREACTIVE

## 2020-12-08 LAB — HEMOGLOBIN A1C
Hgb A1c MFr Bld: 6.6 % — ABNORMAL HIGH (ref 4.8–5.6)
Mean Plasma Glucose: 142.72 mg/dL

## 2020-12-08 LAB — GLUCOSE, CAPILLARY
Glucose-Capillary: 77 mg/dL (ref 70–99)
Glucose-Capillary: 121 mg/dL — ABNORMAL HIGH (ref 70–99)

## 2020-12-08 LAB — CREATININE, SERUM
Creatinine, Ser: 1.29 mg/dL — ABNORMAL HIGH (ref 0.61–1.24)
GFR, Estimated: >60 mL/min (ref >60)

## 2020-12-08 LAB — LACTIC ACID, PLASMA: Lactic Acid, Venous: 1.4 mmol/L (ref 0.5–1.9)

## 2020-12-08 MED ORDER — ACETAMINOPHEN 650 MG RE SUPP: 650.0000 mg | Freq: Four times a day (QID) | RECTAL | Status: DC | PRN

## 2020-12-08 MED ORDER — HYDROCODONE-ACETAMINOPHEN 10-325 MG PO TABS
1.0000 | ORAL_TABLET | Freq: Four times a day (QID) | ORAL | Status: DC | PRN
  Administered 2020-12-08: 1 via ORAL
  Filled 2020-12-08: qty 1

## 2020-12-08 MED ORDER — VANCOMYCIN HCL 2000 MG/400ML IV SOLN
2000.0000 mg | INTRAVENOUS | Status: DC
  Administered 2020-12-08 – 2020-12-10 (×3): 2000 mg via INTRAVENOUS
  Filled 2020-12-08 (×3): qty 400

## 2020-12-08 MED ORDER — PANTOPRAZOLE SODIUM 40 MG PO TBEC
40.0000 mg | DELAYED_RELEASE_TABLET | Freq: Every day | ORAL | Status: DC
  Administered 2020-12-09 – 2020-12-11 (×3): 40 mg via ORAL
  Filled 2020-12-08 (×3): qty 1

## 2020-12-08 MED ORDER — HEPARIN SODIUM (PORCINE) 5000 UNIT/ML IJ SOLN
5000.0000 [IU] | Freq: Three times a day (TID) | INTRAMUSCULAR | Status: DC
  Administered 2020-12-08 – 2020-12-11 (×6): 5000 [IU] via SUBCUTANEOUS
  Filled 2020-12-08 (×6): qty 1

## 2020-12-08 MED ORDER — SODIUM CHLORIDE 0.9 % IV SOLN
2.0000 g | Freq: Three times a day (TID) | INTRAVENOUS | Status: DC
  Administered 2020-12-08 – 2020-12-11 (×9): 2 g via INTRAVENOUS
  Filled 2020-12-08 (×9): qty 2

## 2020-12-08 MED ORDER — EZETIMIBE 10 MG PO TABS
10.0000 mg | ORAL_TABLET | Freq: Every day | ORAL | Status: DC
  Administered 2020-12-08 – 2020-12-10 (×3): 10 mg via ORAL
  Filled 2020-12-08 (×5): qty 1

## 2020-12-08 MED ORDER — ZOLPIDEM TARTRATE 5 MG PO TABS
10.0000 mg | ORAL_TABLET | Freq: Every evening | ORAL | Status: DC | PRN
  Administered 2020-12-08: 10 mg via ORAL
  Filled 2020-12-08: qty 2

## 2020-12-08 MED ORDER — POLYETHYLENE GLYCOL 3350 17 G PO PACK: 17.0000 g | PACK | Freq: Every day | ORAL | Status: DC | PRN

## 2020-12-08 MED ORDER — METRONIDAZOLE IN NACL 5-0.79 MG/ML-% IV SOLN
500.0000 mg | Freq: Three times a day (TID) | INTRAVENOUS | Status: DC
  Administered 2020-12-08 – 2020-12-11 (×9): 500 mg via INTRAVENOUS
  Filled 2020-12-08 (×9): qty 100

## 2020-12-08 MED ORDER — LEVOTHYROXINE SODIUM 112 MCG PO TABS
112.0000 ug | ORAL_TABLET | Freq: Every day | ORAL | Status: DC
  Administered 2020-12-09 – 2020-12-11 (×2): 112 ug via ORAL
  Filled 2020-12-08 (×3): qty 1

## 2020-12-08 MED ORDER — ALPRAZOLAM 0.5 MG PO TABS
1.0000 mg | ORAL_TABLET | Freq: Three times a day (TID) | ORAL | Status: DC | PRN
  Administered 2020-12-08 – 2020-12-10 (×2): 1 mg via ORAL
  Filled 2020-12-08 (×2): qty 2

## 2020-12-08 MED ORDER — ACETAMINOPHEN 325 MG PO TABS: 650.0000 mg | ORAL_TABLET | Freq: Four times a day (QID) | ORAL | Status: DC | PRN

## 2020-12-08 MED ORDER — METOPROLOL SUCCINATE ER 100 MG PO TB24
100.0000 mg | ORAL_TABLET | Freq: Two times a day (BID) | ORAL | Status: DC
  Administered 2020-12-08 – 2020-12-11 (×5): 100 mg via ORAL
  Filled 2020-12-08 (×6): qty 1

## 2020-12-08 MED ORDER — INSULIN ASPART 100 UNIT/ML ~~LOC~~ SOLN
0.0000 [IU] | Freq: Three times a day (TID) | SUBCUTANEOUS | Status: DC
  Administered 2020-12-09 (×3): 2 [IU] via SUBCUTANEOUS
  Administered 2020-12-10: 8 [IU] via SUBCUTANEOUS
  Administered 2020-12-10: 11 [IU] via SUBCUTANEOUS
  Administered 2020-12-11: 3 [IU] via SUBCUTANEOUS

## 2020-12-08 MED ORDER — LISINOPRIL-HYDROCHLOROTHIAZIDE 20-12.5 MG PO TABS: 1.0000 | ORAL_TABLET | Freq: Two times a day (BID) | ORAL | Status: DC

## 2020-12-08 MED ORDER — HYDROCHLOROTHIAZIDE 12.5 MG PO CAPS
12.5000 mg | ORAL_CAPSULE | Freq: Two times a day (BID) | ORAL | Status: DC
  Administered 2020-12-09 – 2020-12-11 (×5): 12.5 mg via ORAL
  Filled 2020-12-08 (×6): qty 1

## 2020-12-08 MED ORDER — VANCOMYCIN HCL 2000 MG/400ML IV SOLN: 2000.0000 mg | INTRAVENOUS | Status: DC

## 2020-12-08 MED ORDER — COLCHICINE 0.6 MG PO TABS: 0.6000 mg | ORAL_TABLET | Freq: Four times a day (QID) | ORAL | Status: DC | PRN

## 2020-12-08 MED ORDER — PIPERACILLIN-TAZOBACTAM 3.375 G IVPB 30 MIN
3.3750 g | Freq: Once | INTRAVENOUS | Status: DC
  Filled 2020-12-08: qty 50

## 2020-12-08 MED ORDER — CLONIDINE HCL 0.2 MG PO TABS
0.3000 mg | ORAL_TABLET | Freq: Two times a day (BID) | ORAL | Status: DC
  Administered 2020-12-08 – 2020-12-11 (×6): 0.3 mg via ORAL
  Filled 2020-12-08 (×6): qty 1

## 2020-12-08 MED ORDER — ROSUVASTATIN CALCIUM 20 MG PO TABS
40.0000 mg | ORAL_TABLET | Freq: Every day | ORAL | Status: DC
  Administered 2020-12-08 – 2020-12-10 (×3): 40 mg via ORAL
  Filled 2020-12-08 (×4): qty 2

## 2020-12-08 MED ORDER — AMLODIPINE BESYLATE 10 MG PO TABS
10.0000 mg | ORAL_TABLET | Freq: Every day | ORAL | Status: DC
  Administered 2020-12-09 – 2020-12-11 (×3): 10 mg via ORAL
  Filled 2020-12-08 (×3): qty 1

## 2020-12-08 MED ORDER — PIPERACILLIN-TAZOBACTAM 3.375 G IVPB: 3.3750 g | Freq: Three times a day (TID) | INTRAVENOUS | Status: DC

## 2020-12-08 MED ORDER — VANCOMYCIN HCL 2000 MG/400ML IV SOLN
2000.0000 mg | Freq: Once | INTRAVENOUS | Status: DC
  Filled 2020-12-08: qty 400

## 2020-12-08 MED ORDER — LISINOPRIL 20 MG PO TABS
20.0000 mg | ORAL_TABLET | Freq: Two times a day (BID) | ORAL | Status: DC
  Administered 2020-12-09 – 2020-12-11 (×5): 20 mg via ORAL
  Filled 2020-12-08 (×6): qty 1

## 2020-12-08 MED ORDER — FENOFIBRATE 160 MG PO TABS
160.0000 mg | ORAL_TABLET | Freq: Every day | ORAL | Status: DC
  Administered 2020-12-08 – 2020-12-10 (×3): 160 mg via ORAL
  Filled 2020-12-08 (×4): qty 1

## 2020-12-08 NOTE — Progress Notes (Signed)
Pharmacy Antibiotic Note  Robert Huang is a 60 y.o. male admitted on 12/08/2020 with worsening left foot infection concerning for possible osteomyelitis.  Pharmacy has been consulted for vancomycin and cefepime dosing.  Plan: Flagyl 500 mg IV q8h per MD  Cefepime 2g IV q8h   Vanc 2g IV q24h (est AUC 492)  - Monitor renal function and clinical improvement - F/u cultures, LOT, and de-escalation   Temp (24hrs), Avg:98.8 F (37.1 C), Min:98.8 F (37.1 C), Max:98.8 F (37.1 C)  Recent Labs  Lab 12/08/20 1149  WBC 7.2  CREATININE 1.27*  LATICACIDVEN 1.4    CrCl cannot be calculated (Unknown ideal weight.).    No Known Allergies  Antimicrobials this admission: Vancomycin 4/14 >> Cefepime 4/14 >>   Thank you for allowing pharmacy to be a part of this patient's care.  Trixie Rude, PharmD PGY1 Acute Care Pharmacy Resident 12/08/2020 2:43 PM  Please check [email or web address] for unit-specific pharmacy phone numbers.

## 2020-12-08 NOTE — ED Triage Notes (Signed)
Pt arrived complaining of worsening left foot infection. Dressing was changed by wound care clinic Tuesday and pt noticed the injury has spread to the top of his foot.   Pain is worst on top and side of foot, 8/10 Pt states he is having a lot of drainage Pt felt feverish over the weekend

## 2020-12-08 NOTE — ED Provider Notes (Signed)
[facility] Provider Note   CSN: [id number] Arrival date & time: 12/08/20  1132     History Chief Complaint  Patient presents with  . Foot Ulcer    Robert Huang is a 60 y.o. male.  HPI      Robert Huang is a 60 y.o. male, with a history of DM, HTN, hyperlipidemia, presenting to the ED with concern for infection to left foot.  Patient states he has had a wound to the bottom of the left foot for several months without changes. Due to lack of progression in the healing of this ulcer, the wound was scraped to encourage new tissue growth. Following this, the wound seemed to worsen with localized swelling as well as erythema in the region. He was evaluated in the wound care clinic Tuesday, April 5, wound had no evidence of infection.  April 12 patient was again evaluated in the wound care clinic, had erythema to the dorsal and lateral left foot, started on Cipro and linezolid. He states he noted purulent discharge from the wound last night and sloughing of the skin on the dorsal foot as well as worsened erythema and swelling. Endorses intermittent chills, but no noted fever.  Denies chest pain, shortness of breath, other wounds, or any other complaints.   Past Medical History:  Diagnosis Date  . Anxiety   . CAD (coronary artery disease)    sees Dr. Jens Somrenshaw, normal Stress test 07-16-12   . Diabetes mellitus   . DJD (degenerative joint disease)   . Gout   . Hyperlipidemia   . Hypertension   . Hypothyroid   . Osteoarthritis   . Renal calculus    hx  . Umbilical hernia     Patient Active Problem List   Diagnosis Date Noted  . Diabetic foot infection (HCC) 12/08/2020  . Diabetic foot ulcers (HCC) 03/02/2019  . Left foot infection 03/02/2019  . Hypokalemia 03/02/2019  . Chronic right shoulder pain 12/02/2018  . Diabetes mellitus with complication (HCC) 12/03/2017  . Right hip pain 12/27/2015  . MRSA (methicillin  resistant staph aureus) culture positive 12/31/2012  . Open displaced fracture of right great toe 05/20/2012  . Anxiety state 12/25/2008  . Coronary atherosclerosis 12/25/2008  . CHEST PAIN 12/25/2008  . Hypothyroidism 05/27/2007  . Dyslipidemia 05/27/2007  . Gout 05/27/2007  . Essential hypertension 05/27/2007  . Osteoarthrosis, unspecified whether generalized or localized, unspecified site 05/27/2007  . RENAL CALCULUS, HX OF 05/27/2007    Past Surgical History:  Procedure Laterality Date  . AMPUTATION  07/17/2012   Procedure: AMPUTATION RAY;  Surgeon: Toni ArthursJohn Hewitt, MD;  [facility];  Service: Orthopedics;  Laterality: Right;  right hallux amputation (1st ray resection )  . AMPUTATION Left 03/06/2019   Procedure: Ray Amputation left foot;  Surgeon: Yolonda Kidaogers, Jason Patrick, MD;  Location: [facility] ORS;  Service: Orthopedics;  Laterality: Left;  . CORONARY ANGIOPLASTY WITH STENT PLACEMENT    . FINGER SURGERY     left index finger  . I & D EXTREMITY  05/19/2012   Procedure: IRRIGATION AND DEBRIDEMENT EXTREMITY;  Surgeon: Senaida LangeKevin M Supple, MD;  Location: [facility] OR;  Service: Orthopedics;  Laterality: Right;  Right Great toe  . I & D Toe  05/19/2012   rt great toe  . TONSILLECTOMY         Family History  Problem Relation Age of Onset  . Heart attack Mother   . Arthritis Other   . Coronary artery  disease Other   . Diabetes Other   . Hypertension Other   . Prostate cancer Other   . Stroke Other     Social History   Tobacco Use  . Smoking status: Never Smoker  . Smokeless tobacco: Current User    Types: Chew  . Tobacco comment: occ  Substance Use Topics  . Alcohol use: Yes    Alcohol/week: 0.0 standard drinks    Comment: weekends  . Drug use: No    Home Medications Prior to Admission medications   Medication Sig Start Date End Date Taking? Authorizing Provider  acetaminophen (TYLENOL) 325 MG tablet Take 2 tablets (650 mg total) by mouth every 6 (six) hours as needed for mild  pain (or Fever >/= 101). 03/09/19   [name], [name], DO  ALPRAZolam (XANAX) 1 MG tablet TAKE 1 TABLET BY MOUTH 3 TIMES DAILY AS NEEDED FOR ANXIETY. 09/22/20   [name], MD  amLODipine (NORVASC) 10 MG tablet TAKE 1 TABLET (10 MG TOTAL) BY MOUTH DAILY. <PLEASE MAKE APPOINTMENT FOR REFILLS> 11/21/20   [name], MD  Aspirin-Acetaminophen-Caffeine [phone] MG PACK Take by mouth. 07/06/15   [provider]  cloNIDine (CATAPRES) 0.3 MG tablet TAKE 1 TABLET (0.3 MG TOTAL) BY MOUTH 2 (TWO) TIMES DAILY. NEED OV. 11/21/20   [name], MD  colchicine 0.6 MG tablet TAKE 1 TABLET (0.6 MG TOTAL) BY MOUTH EVERY 6 (SIX) HOURS AS NEEDED (GOUT). 02/22/20   [name], MD  ezetimibe (ZETIA) 10 MG tablet TAKE 1 TABLET BY MOUTH EVERY DAY 11/21/20   [name], MD  fenofibrate 160 MG tablet TAKE 1 TABLET (160 MG TOTAL) BY MOUTH DAILY. NEED OV. 11/21/20   [name], MD  fluticasone Aleda Grana) 50 MCG/ACT nasal spray Place 2 sprays into both nostrils daily. 08/13/20   Daphine Deutscher, Mary-Margaret, FNP  glipiZIDE (GLUCOTROL) 5 MG tablet glipizide 5 mg tablet  TAKE 1 TABLET (5 MG TOTAL) BY MOUTH 2 (TWO) TIMES DAILY BEFORE A MEAL. 10/03/20   [name], MD  HYDROcodone-acetaminophen (NORCO) 10-325 MG tablet Take 1 tablet by mouth every 6 (six) hours as needed for moderate pain. 11/26/20 12/26/20  [name], MD  indomethacin (INDOCIN SR) 75 MG CR capsule TAKE 1 CAPSULE BY MOUTH TWICE A DAY WITH A MEAL 10/19/20   [name], MD  levothyroxine (SYNTHROID) 112 MCG tablet TAKE 1 TABLET BY MOUTH EVERY DAY 11/21/20   [name], MD  lisinopril-hydrochlorothiazide (ZESTORETIC) 20-12.5 MG tablet TAKE 1 TABLET BY MOUTH 2 (TWO) TIMES DAILY. 10AM AND 5PM 07/11/20   [name], MD  metFORMIN (GLUCOPHAGE) 1000 MG tablet TAKE 1 TABLET BY MOUTH TWO TIMES A DAY WITH A MEAL 08/23/20   [name], MD  metoprolol succinate (TOPROL-XL) 100 MG 24 hr tablet TAKE 1 TABLET BY MOUTH TWICE A DAY 07/29/20   [name], MD  nitroGLYCERIN (NITROSTAT) 0.4 MG SL tablet Place 1 tablet (0.4 mg total) under the tongue every 5 (five) minutes as needed. For chest pain. Patient taking differently: Place 0.4 mg under the tongue every 5 (five) minutes as needed for chest pain. 05/21/18   Lewayne Bunting, MD  omeprazole (PRILOSEC) 40 MG capsule Take 1 capsule (40 mg total) by mouth daily. 09/28/20   [name], MD  [facility] ULTRA test strip 1 EACH BY OTHER ROUTE DAILY. USE AS INSTRUCTED 11/01/20   [name], MD  rosuvastatin (CRESTOR) 40 MG tablet TAKE  1 TABLET (40 MG TOTAL) BY MOUTH DAILY. <PLEASE MAKE APPOINTMENT FOR REFILLS> 02/01/20   [name], MD  zolpidem (AMBIEN) 10 MG tablet TAKE 1 TABLET BY MOUTH AT BEDTIME AS NEEDED FOR SLEEP. 07/20/20   [name], MD    Allergies    Patient has no known allergies.  Review of Systems   Review of Systems  Constitutional: Positive for chills. Negative for fever.  Respiratory: Negative for shortness of breath.   Cardiovascular: Negative for chest pain.  Gastrointestinal: Negative for abdominal pain, diarrhea, nausea and vomiting.  Musculoskeletal: Positive for arthralgias.  Neurological: Negative for weakness and numbness.  All other systems reviewed and are negative.   Physical Exam Updated Vital Signs BP 134/75 (BP Location: Left Arm)   Pulse (!) 56   Temp 98.8 F (37.1 C) (Oral)   Resp 16   SpO2 100%   Physical Exam Vitals and nursing note reviewed.  Constitutional:      General: He is not in acute distress.    Appearance: He is well-developed. He is not diaphoretic.  HENT:     Head: Normocephalic and atraumatic.     Mouth/Throat:     Mouth: Mucous membranes are moist.     Pharynx: Oropharynx is clear.  Eyes:     Conjunctiva/sclera: Conjunctivae normal.  Cardiovascular:     Rate and Rhythm: Normal rate and regular rhythm.     Pulses: Normal pulses.          Radial pulses are 2+ on the right side and 2+ on the left side.        Posterior tibial pulses are 2+ on the right side and 2+ on the left side.  Pulmonary:     Effort: Pulmonary effort is normal. No respiratory distress.  Abdominal:     Tenderness: There is no abdominal tenderness. There is no guarding.  Musculoskeletal:     Cervical back: Neck supple.     Comments: Erythema and edema to the dorsal and lateral left foot extending beyond the lines drawn on 4/12.  Skin:    General: Skin is warm and dry.     Capillary Refill: Capillary refill takes less than 2 seconds.  Neurological:     Mental Status: He is alert.     Comments: Some decreased sensation to light touch in the left big toe.  Sensation light touch otherwise grossly intact. Motor function intact in the toes of the left foot.  Psychiatric:        Mood and Affect: Mood and affect normal.        Speech: Speech normal.        Behavior: Behavior normal.          Appearance of patient's foot on 12/06/20:    Appearance of patient's foot today:             ED Results / Procedures / Treatments   Labs (all labs ordered are listed, but only abnormal results are displayed) Labs Reviewed  COMPREHENSIVE METABOLIC PANEL - Abnormal; Notable for the following components:      Result Value   CO2 20 (*)    Glucose, Bld 131 (*)    Creatinine, Ser 1.27 (*)    Albumin 3.0 (*)    All other components within normal limits  CBC WITH DIFFERENTIAL/PLATELET - Abnormal; Notable for the following components:   RBC 3.54 (*)    Hemoglobin 10.5 (*)    HCT 33.4 (*)    Lymphs Abs  0.6 (*)    All other components within normal limits  SARS CORONAVIRUS 2 (TAT 6-24 HRS)  LACTIC ACID, PLASMA  URINALYSIS, ROUTINE W REFLEX MICROSCOPIC  HIV ANTIBODY (ROUTINE TESTING W REFLEX)  CBC  CREATININE, SERUM    EKG None  Radiology DG Foot Complete Left  Result Date: 12/08/2020 CLINICAL DATA:  Left foot infection. EXAM: LEFT FOOT - COMPLETE 3+ VIEW COMPARISON:  October 13, 2020. FINDINGS: Status post  amputation of fifth metatarsal. Degenerative changes seen involving the first metatarsophalangeal joint. Dislocation of fourth metatarsophalangeal joint is noted. No acute fracture is noted. No lytic destruction is noted to suggest osteomyelitis. IMPRESSION: Stable postsurgical and degenerative changes as described above. Interval development of dislocation of fourth metatarsophalangeal joint. No definite evidence of osteomyelitis. Electronically Signed   By: Lupita Raider M.D.   On: 12/08/2020 12:18    Procedures Procedures   Medications Ordered in ED Medications  metroNIDAZOLE (FLAGYL) IVPB 500 mg (500 mg Intravenous New Bag/Given 12/08/20 1541)  ceFEPIme (MAXIPIME) 2 g in sodium chloride 0.9 % 100 mL IVPB (2 g Intravenous New Bag/Given 12/08/20 1543)  vancomycin (VANCOREADY) IVPB 2000 mg/400 mL (has no administration in time range)  heparin injection 5,000 Units (has no administration in time range)  acetaminophen (TYLENOL) tablet 650 mg (has no administration in time range)    Or  acetaminophen (TYLENOL) suppository 650 mg (has no administration in time range)  polyethylene glycol (MIRALAX / GLYCOLAX) packet 17 g (has no administration in time range)    ED Course  I have reviewed the triage vital signs and the nursing notes.  Pertinent labs & imaging results that were available during my care of the patient were reviewed by me and considered in my medical decision making (see chart for details).  Clinical Course as of 12/08/20 1620  Thu Dec 08, 2020  1425 Charma Igo, [place] for orthopedics, will see the patient.  [SJ]  1443 Spoke with Dr. Luberta Robertson, hospitalist.  Agrees to admit the patient. [SJ]    Clinical Course User Index [SJ] Lauriana Denes C, PA-C   MDM Rules/Calculators/A&P                          Patient presents with foot wound and evidence of cellulitis worsening despite antibiotic therapy. Patient is nontoxic appearing, afebrile, not tachycardic, not tachypneic, not  hypotensive, maintains excellent SPO2 on room air, and is in no apparent distress.   I have reviewed the patient's chart to obtain more information.   I reviewed and interpreted the patient's labs and radiological studies. No leukocytosis.  No lactic acidosis.  Due to the patient's worsening despite antibiotic therapy, patient would benefit from admission for IV antibiotics and further management.   Findings and plan of care discussed with attending physician, Theda Belfast, MD.   Vitals:   12/08/20 1137 12/08/20 1351 12/08/20 1400  BP: 130/60 134/75   Pulse: 64 (!) 56   Resp: 16 16   Temp: 98.8 F (37.1 C)    TempSrc: Oral    SpO2: 100% 100%   Weight:   93 kg  Height:   6' (1.829 m)     Final Clinical Impression(s) / ED Diagnoses Final diagnoses:  Diabetic foot infection (HCC)  Cellulitis of left foot    Rx / DC Orders ED Discharge Orders    None       Concepcion Living 12/08/20 1621    Tegeler, Canary Brim, MD 12/08/20  1628  

## 2020-12-08 NOTE — Plan of Care (Signed)

## 2020-12-08 NOTE — Progress Notes (Signed)
NICOLUS, OSE T ([id number]) . Visit Report for 12/06/2020 Arrival Information Details Patient Name: Date of Service: Robert Huang Medical Record Number: [id number] Patient Account Number: [id number] Date of Birth/Sex: Treating RN: Oct 06, 1960 (60 y.o. Robert Huang, Robert Huang Primary Care Alwyn Cordner: [name] Other Clinician: Referring Cheralyn Oliver: Treating Corwyn Vora/Extender: Doran Stabler in Treatment: 11 Visit Information History Since Last Visit Added or deleted any medications: No Patient Arrived: Ambulatory Any new allergies or adverse reactions: No Arrival Time: 12:41 Had a fall or experienced change in No Accompanied By: self activities of daily living that may affect Transfer Assistance: None risk of falls: Patient Identification Verified: Yes Signs or symptoms of abuse/neglect since last visito No Secondary Verification Process Completed: Yes Hospitalized since last visit: No Patient Requires Transmission-Based Precautions: No Implantable device outside of the clinic excluding No Patient Has Alerts: Yes cellular tissue based products placed in the center Patient Alerts: R ABI: 0.96 since last visit: L ABI: 1.19 Has Dressing in Place as Prescribed: Yes Pain Present Now: Yes Electronic Signature(s) Signed: 12/07/2020 8:29:36 AM By: Karl Ito Entered By: Karl Ito on 12/06/2020 12:42:10 -------------------------------------------------------------------------------- Clinic Level of Care Assessment Details Patient Name: Date of Service: Robert Huang Medical Record Number: [id number] Patient Account Number: [id number] Date of Birth/Sex: Treating RN: 1960/12/29 (60 y.o. Robert Huang, Robert Huang Primary Care Daeshawn Redmann: [name] Other Clinician: Referring Jansel Vonstein: Treating Eliga Arvie/Extender: Doran Stabler in Treatment: 11 Clinic Level of Care Assessment  Items TOOL 4 Quantity Score X- 1 0 Use when only an EandM is performed on FOLLOW-UP visit ASSESSMENTS - Nursing Assessment / Reassessment X- 1 10 Reassessment of Co-morbidities (includes updates in patient status) X- 1 5 Reassessment of Adherence to Treatment Plan ASSESSMENTS - Wound and Skin A ssessment / Reassessment X - Simple Wound Assessment / Reassessment - one wound 1 5 []  - 0 Complex Wound Assessment / Reassessment - multiple wounds X- 1 10 Dermatologic / Skin Assessment (not related to wound area) ASSESSMENTS - Focused Assessment X- 1 5 Circumferential Edema Measurements - multi extremities X- 1 10 Nutritional Assessment / Counseling / Intervention []  - 0 Lower Extremity Assessment (monofilament, tuning fork, pulses) []  - 0 Peripheral Arterial Disease Assessment (using hand held doppler) ASSESSMENTS - Ostomy and/or Continence Assessment and Care []  - 0 Incontinence Assessment and Management []  - 0 Ostomy Care Assessment and Management (repouching, etc.) PROCESS - Coordination of Care X - Simple Patient / Family Education for ongoing care 1 15 []  - 0 Complex (extensive) Patient / Family Education for ongoing care X- 1 10 Staff obtains , Records, T Results / Process Orders est []  - 0 Staff telephones HHA, Nursing Homes / Clarify orders / etc []  - 0 Routine Transfer to another Facility (non-emergent condition) []  - 0 Routine Hospital Admission (non-emergent condition) []  - 0 New Admissions / / Ordering NPWT Apligraf, etc. , []  - 0 Emergency Hospital Admission (emergent condition) X- 1 10 Simple Discharge Coordination []  - 0 Complex (extensive) Discharge Coordination PROCESS - Special Needs []  - 0 Pediatric / Minor Patient Management []  - 0 Isolation Patient Management []  - 0 Hearing / Language / Visual special needs []  - 0 Assessment of Community assistance (transportation, D/C planning, etc.) []  - 0 Additional  assistance / Altered mentation []  - 0 Support Surface(s) Assessment (bed, cushion, seat, etc.) INTERVENTIONS - Wound Cleansing / Measurement X - Simple Wound  Cleansing - one wound 1 5 []  - 0 Complex Wound Cleansing - multiple wounds X- 1 5 Wound Imaging (photographs - any number of wounds) []  - 0 Wound Tracing (instead of photographs) X- 1 5 Simple Wound Measurement - one wound []  - 0 Complex Wound Measurement - multiple wounds INTERVENTIONS - Wound Dressings X - Small Wound Dressing one or multiple wounds 1 10 []  - 0 Medium Wound Dressing one or multiple wounds []  - 0 Large Wound Dressing one or multiple wounds []  - 0 Application of Medications - topical []  - 0 Application of Medications - injection INTERVENTIONS - Miscellaneous []  - 0 External ear exam X- 1 5 Specimen Collection (cultures, biopsies, blood, body fluids, etc.) []  - 0 Specimen(s) / Culture(s) sent or taken to Lab for analysis []  - 0 Patient Transfer (multiple staff / / Similar devices) []  - 0 Simple Staple / Suture removal (25 or less) []  - 0 Complex Staple / Suture removal (26 or more) []  - 0 Hypo / Hyperglycemic Management (close monitor of Blood Glucose) []  - 0 Ankle / Brachial Index (ABI) - do not check if billed separately X- 1 5 Vital Signs Has the patient been seen at the hospital within the last three years: Yes Total Score: 115 Level Of Care: New/Established - Level 3 Electronic Signature(s) Signed: 12/06/2020 6:06:00 Huang By: Entered By: on 12/06/2020 13:16:30 -------------------------------------------------------------------------------- Encounter Discharge Information Details Patient Name: Date of Service: , T. 12/06/2020 12:30 Huang Medical Record Number: Patient Account Number: Date of Birth/Sex: Treating RN: Apr 02, 1961 (59 y.o. Primary Care Sahra Converse: Other Clinician: Referring  Zenobia Kuennen: Treating Kyler Lerette/Extender: in Treatment: 11 Encounter Discharge Information Items Discharge Condition: Stable Ambulatory Status: Ambulatory Discharge Destination: Home Transportation: Private Auto Schedule Follow-up Appointment: Yes Clinical Summary of Care: Provided on 12/06/2020 Form Type Recipient Paper Patient Patient Electronic Signature(s) Signed: 12/06/2020 1:47:48 Huang By: Shawn Stall Entered By: Shawn Stall on 12/06/2020 13:47:48 -------------------------------------------------------------------------------- Lower Extremity Assessment Details Patient Name: Date of Service: Robert Huang T. 12/06/2020 12:30 Huang Medical Record Number: Robert Patient Account Number: [id number] Date of Birth/Sex: Treating RN: 10/19/1960 (59 y.o. 12/14/1960 Primary Care Leam Madero: Lytle Michaels Other Clinician: Referring Amoria Mclees: Treating Mireya Meditz/Extender: [name] in Treatment: 11 Edema Assessment Assessed: Doran Stabler: No] Robert: No] Edema: [Left: N] [Right: o] Calf Left: Right: Point of Measurement: 35 cm From Medial Instep 36.5 cm Ankle Left: Right: Point of Measurement: 11 cm From Medial Instep 21 cm Vascular Assessment Pulses: Dorsalis Pedis Palpable: [Left:Yes] Electronic Signature(s) Signed: 12/07/2020 5:13:29 Huang By: Antonieta Iba RN, BSN Entered By: Antonieta Iba on 12/06/2020 13:05:06 -------------------------------------------------------------------------------- Multi Wound Chart Details Patient Name: Date of Service: Robert Huang, Robert T. 12/06/2020 12:30 Huang Medical Record Number: [id number] Patient Account Number: 12/14/1960 Date of Birth/Sex: Treating RN: 10-07-1960 (59 y.o. [name], Robert Huang Primary Care Jayliana Valencia: Doran Stabler Other Clinician: Referring Elliot Simoneaux: Treating Markevion Lattin/Extender: Kyra Searles in Treatment: 11 Vital  Signs Height(in): 72 Capillary Blood Glucose(mg/dl): Franne Forts Weight(lbs): 12/09/2020 Pulse(bpm): 76 Body Mass Index(BMI): 28 Blood Pressure(mmHg): 136/75 Temperature(F): 98.3 Respiratory Rate(breaths/min): 18 Photos: [5:No Photos Left Metatarsal head fourth] [N/A:N/A N/A] Wound Location: [5:Shear/Friction] [N/A:N/A] Wounding Event: [5:Diabetic Wound/Ulcer of the Lower] [N/A:N/A] Primary Etiology: [5:Extremity Coronary Artery Disease,] [N/A:N/A] Comorbid History: [5:Hypertension, Type II Diabetes, Gout 08/29/2020] [N/A:N/A] Date Acquired: [5:11] [N/A:N/A] Weeks  of Treatment: [5:Open] [N/A:N/A] Wound Status: [5:0.7x0.9x0.4] [N/A:N/A] Measurements L x W x D (cm) [5:0.495] [N/A:N/A] A (cm) : rea [5:0.198] [N/A:N/A] Volume (cm) : [5:86.00%] [N/A:N/A] % Reduction in A rea: [5:72.00%] [N/A:N/A] % Reduction in Volume: [5:12] Starting Position 1 (o'clock): [5:6] Ending Position 1 (o'clock): [5:1] Maximum Distance 1 (cm): [5:Yes] [N/A:N/A] Undermining: [5:Grade 2] [N/A:N/A] Classification: [5:Medium] [N/A:N/A] Exudate A mount: [5:Purulent] [N/A:N/A] Exudate Type: [5:yellow, brown, green] [N/A:N/A] Exudate Color: [5:Epibole] [N/A:N/A] Wound Margin: [5:Large (67-100%)] [N/A:N/A] Granulation A mount: [5:Pink] [N/A:N/A] Granulation Quality: [5:Small (1-33%)] [N/A:N/A] Necrotic A mount: [5:Fat Layer (Subcutaneous Tissue): Yes N/A] Exposed Structures: [5:Bone: Yes Fascia: No Tendon: No Muscle: No Joint: No Small (1-33%)] [N/A:N/A] Treatment Notes Electronic Signature(s) Signed: 12/06/2020 4:33:39 Huang By: Baltazar Najjarobson, Michael MD Signed: 12/08/2020 5:45:48 Huang By: Fonnie MuBreedlove, [name] RN Signed: 12/08/2020 5:45:48 Huang By: Fonnie MuBreedlove, [name] RN Entered By: Baltazar Najjarobson, Michael on 12/06/2020 13:30:05 -------------------------------------------------------------------------------- Multi-Disciplinary Care Plan Details Patient Name: Date of Service: Robert Huang, Robert DebarA LA N T. 12/06/2020 12:30 Huang Medical Record Number:  [id number] Patient Account Number: [id number] Date of Birth/Sex: Treating RN: 1960-10-27 (59 y.o. Robert FlorM) Deaton, Yvonne KendallBobbi Primary Care Jaxiel Kines: Nelwyn SalisburyFry, Stephen A Other Clinician: Referring Rico Massar: Treating Koreen Lizaola/Extender: Doran Stablerobson, Michael Fry, Stephen A Weeks in Treatment: 11 Multidisciplinary Care Plan reviewed with physician Active Inactive Soft Tissue Infection Nursing Diagnoses: Potential for infection: soft tissue Goals: Patient will remain free of wound infection Date Initiated: 12/06/2020 Target Resolution Date: 12/30/2020 Goal Status: Active Patient's soft tissue infection will resolve Date Initiated: 12/06/2020 Target Resolution Date: 12/30/2020 Goal Status: Active Interventions: Assess signs and symptoms of infection every visit Provide education on infection Screen for HBO Treatment Activities: Culture : 12/06/2020 Culture and sensitivity : 12/06/2020 T ordered outside of clinic : 12/06/2020 est Notes: Wound/Skin Impairment Nursing Diagnoses: Impaired tissue integrity Knowledge deficit related to ulceration/compromised skin integrity Goals: Patient/caregiver will verbalize understanding of skin care regimen Date Initiated: 09/14/2020 Target Resolution Date: 12/11/2020 Goal Status: Active Ulcer/skin breakdown will have a volume reduction of 30% by week 4 Date Initiated: 09/14/2020 Date Inactivated: 10/20/2020 Target Resolution Date: 10/12/2020 Unmet Reason: see wound Goal Status: Unmet measurements. Interventions: Assess patient/caregiver ability to obtain necessary supplies Assess patient/caregiver ability to perform ulcer/skin care regimen upon admission and as needed Assess ulceration(s) every visit Treatment Activities: Topical wound management initiated : 09/14/2020 Notes: Electronic Signature(s) Signed: 12/06/2020 6:06:00 Huang By: Shawn Stalleaton, Bobbi Signed: 12/06/2020 6:06:00 Huang By: Shawn Stalleaton, Bobbi Entered By: Shawn Stalleaton, Bobbi on 12/06/2020  13:08:42 -------------------------------------------------------------------------------- Pain Assessment Details Patient Name: Date of Service: Robert Huang, Robert DebarA LA N T. 12/06/2020 12:30 Huang Medical Record Number: [id number] Patient Account Number: [id number] Date of Birth/Sex: Treating RN: 1960-10-27 (59 y.o. Robert MerlM) Breedlove, Robert Huang Primary Care Jackqulyn Mendel: Nelwyn SalisburyFry, Stephen A Other Clinician: Referring Zonnie Landen: Treating Saylor Sheckler/Extender: Doran Stablerobson, Michael Fry, Stephen A Weeks in Treatment: 11 Active Problems Location of Pain Severity and Description of Pain Patient Has Paino Yes Site Locations Rate the pain. Current Pain Level: 6 Pain Management and Medication Current Pain Management: Electronic Signature(s) Signed: 12/07/2020 8:29:36 AM By: Karl Itoawkins, Destiny Signed: 12/08/2020 5:45:48 Huang By: Fonnie MuBreedlove, [name] RN Entered By: Karl Itoawkins, Destiny on 12/06/2020 12:43:04 -------------------------------------------------------------------------------- Patient/Caregiver Education Details Patient Name: Date of Service: Robert GoingGRINDSTA Huang, [name] 4/12/2022andnbsp12:30 Huang Medical Record Number: [id number] Patient Account Number: [id number] Date of Birth/Gender: Treating RN: 1960-10-27 (59 y.o. Tammy SoursM) Deaton, Bobbi Primary Care Physician: Nelwyn SalisburyFry, Stephen A Other Clinician: Referring Physician: Treating Physician/Extender: Doran Stablerobson, Michael Fry, Stephen A Weeks in Treatment: 11 Education Assessment Education Provided To: Patient Education Topics Provided Wound/Skin Impairment:  Handouts: Skin Care Do's and Dont's Methods: Explain/Verbal Responses: Reinforcements needed Electronic Signature(s) Signed: 12/06/2020 6:06:00 Huang By: Shawn Stall Entered By: Shawn Stall on 12/06/2020 13:08:55 -------------------------------------------------------------------------------- Wound Assessment Details Patient Name: Date of Service: Robert Huang Medical Record Number: [id number] Patient  Account Number: [id number] Date of Birth/Sex: Treating RN: [birth year] (59 y.o. Robert Huang, Robert Huang Primary Care Jemarion Roycroft: [name] Other Clinician: Referring Desira Alessandrini: Treating Ajai Terhaar/Extender: Doran Stabler in Treatment: 11 Wound Status Wound Number: 5 Primary Diabetic Wound/Ulcer of the Lower Extremity Etiology: Wound Location: Left Metatarsal head fourth Wound Status: Open Wounding Event: Shear/Friction Comorbid Coronary Artery Disease, Hypertension, Type II Diabetes, Date Acquired: 08/29/2020 History: Gout Weeks Of Treatment: 11 Clustered Wound: No Photos Wound Measurements Length: (cm) 0.7 Width: (cm) 0.9 Depth: (cm) 0.4 Area: (cm) 0.495 Volume: (cm) 0.198 % Reduction in Area: 86% % Reduction in Volume: 72% Epithelialization: Small (1-33%) Tunneling: No Undermining: Yes Starting Position (o'clock): 12 Ending Position (o'clock): 6 Maximum Distance: (cm) 1 Wound Description Classification: Grade 2 Wound Margin: Epibole Exudate Amount: Medium Exudate Type: Purulent Exudate Color: yellow, brown, green Foul Odor After Cleansing: No Slough/Fibrino Yes Wound Bed Granulation Amount: Large (67-100%) Exposed Structure Granulation Quality: Pink Fascia Exposed: No Necrotic Amount: Small (1-33%) Fat Layer (Subcutaneous Tissue) Exposed: Yes Necrotic Quality: Adherent Slough Tendon Exposed: No Muscle Exposed: No Joint Exposed: No Bone Exposed: Yes Treatment Notes Wound #5 (Metatarsal head fourth) Wound Laterality: Left Cleanser Peri-Wound Care Topical Primary Dressing KerraCel Ag Gelling Fiber Dressing, 2x2 in (silver alginate) Discharge Instruction: Apply silver alginate to wound bed as instructed Secondary Dressing Woven Gauze Sponges 2x2 in Discharge Instruction: Apply over primary dressing as directed. Optifoam Non-Adhesive Dressing, 4x4 in Discharge Instruction: Apply over primary dressing cut to make foam donut Secured  With Conforming Stretch Gauze Bandage, Sterile 2x75 (in/in) Discharge Instruction: Secure with stretch gauze as directed. Paper Tape, 2x10 (in/yd) Discharge Instruction: Secure dressing with tape as directed. Compression Wrap Compression Stockings Add-Ons Electronic Signature(s) Signed: 12/07/2020 8:29:36 AM By: Karl Ito Signed: 12/08/2020 5:45:48 Huang By: Fonnie Mu RN Entered By: Karl Ito on 12/06/2020 16:47:32 -------------------------------------------------------------------------------- Vitals Details Patient Name: Date of Service: Robert Huang, Robert Debar T. 12/06/2020 12:30 Huang Medical Record Number: [id number] Patient Account Number: [id number] Date of Birth/Sex: Treating RN: Dec 05, 1960 (59 y.o. Robert Huang, Robert Huang Primary Care Jarian Longoria: [name] Other Clinician: Referring Larell Baney: Treating Elysse Polidore/Extender: Doran Stabler in Treatment: 11 Vital Signs Time Taken: 12:43 Temperature (F): 98.3 Height (in): 72 Pulse (bpm): 76 Weight (lbs): 205 Respiratory Rate (breaths/min): 18 Body Mass Index (BMI): 27.8 Blood Pressure (mmHg): 136/75 Capillary Blood Glucose (mg/dl): 932 Reference Range: 80 - 120 mg / dl Electronic Signature(s) Signed: 12/07/2020 8:29:36 AM By: Karl Ito Entered By: Karl Ito on 12/06/2020 12:44:52

## 2020-12-08 NOTE — Consult Note (Signed)
Reason for Consult:Left foot ulcer Referring Physician: Arthur Holms Time called: 1422 Time at bedside: 1450   Robert Huang is an 60 y.o. male.  HPI: Robert Huang comes in with a weeklong history of worsening left foot ulcer. He is being followed at the wound care center and had been stable. He has had unnaboots on. About a week ago he noticed he was having some chills. He did not measure any temperatures. When he went to have the boot changed the ulceration was noted to be much worse and he was sent to the ED. He denies pain. He has ongoing problems with this and underwent 5th ray amputation about 2y ago.  Past Medical History:  Diagnosis Date  . Anxiety   . CAD (coronary artery disease)    sees Dr. [name], normal Stress test 07-16-12   . Diabetes mellitus   . DJD (degenerative joint disease)   . Gout   . Hyperlipidemia   . Hypertension   . Hypothyroid   . Osteoarthritis   . Renal calculus    hx  . Umbilical hernia     Past Surgical History:  Procedure Laterality Date  . AMPUTATION  07/17/2012   Procedure: AMPUTATION RAY;  Surgeon: [name], MD;  Location: [facility] OR;  Service: Orthopedics;  Laterality: Right;  right hallux amputation (1st ray resection )  . AMPUTATION Left 03/06/2019   Procedure: Ray Amputation left foot;  Surgeon: [name], MD;  Location: [facility] ORS;  Service: Orthopedics;  Laterality: Left;  . CORONARY ANGIOPLASTY WITH STENT PLACEMENT    . FINGER SURGERY     left index finger  . I & D EXTREMITY  05/19/2012   Procedure: IRRIGATION AND DEBRIDEMENT EXTREMITY;  Surgeon: [name], MD;  Location: [facility] OR;  Service: Orthopedics;  Laterality: Right;  Right Great toe  . I & D Toe  05/19/2012   rt great toe  . TONSILLECTOMY      Family History  Problem Relation Age of Onset  . Heart attack Mother   . Arthritis Other   . Coronary artery disease Other   . Diabetes Other   . Hypertension Other   . Prostate cancer Other   . Stroke Other     Social  History:  reports that he has never smoked. His smokeless tobacco use includes chew. He reports current alcohol use. He reports that he does not use drugs.  Allergies: No Known Allergies  Medications: I have reviewed the patient's current medications.  Results for orders placed or performed during the hospital encounter of 12/08/20 (from the past 48 hour(s))  Lactic acid, plasma     Status: None   Collection Time: 12/08/20 11:49 AM  Result Value Ref Range   Lactic Acid, Venous 1.4 0.5 - 1.9 mmol/L    Comment: Performed at [facility], [address] [place], [place] [address]  Comprehensive metabolic panel     Status: Abnormal   Collection Time: 12/08/20 11:49 AM  Result Value Ref Range   Sodium 138 135 - 145 mmol/L   Potassium 4.0 3.5 - 5.1 mmol/L   Chloride 107 98 - 111 mmol/L   CO2 20 (L) 22 - 32 mmol/L   Glucose, Bld 131 (H) 70 - 99 mg/dL    Comment: Glucose reference range applies only to samples taken after fasting for at least 8 hours.   BUN 14 6 - 20 mg/dL   Creatinine, Ser 7.85 (H) 0.61 - 1.24 mg/dL   Calcium  8.9 8.9 - 10.3 mg/dL   Total Protein 7.1 6.5 - 8.1 g/dL   Albumin 3.0 (L) 3.5 - 5.0 g/dL   AST 16 15 - 41 U/L   ALT 14 0 - 44 U/L   Alkaline Phosphatase 56 38 - 126 U/L   Total Bilirubin 0.5 0.3 - 1.2 mg/dL   GFR, Estimated >23 >76 mL/min    Comment: (NOTE) Calculated using the CKD-EPI Creatinine Equation (2021)    Anion gap 11 5 - 15    Comment: Performed at [facility], [address], [place], [place] [address]  CBC with Differential     Status: Abnormal   Collection Time: 12/08/20 11:49 AM  Result Value Ref Range   WBC 7.2 4.0 - 10.5 K/uL   RBC 3.54 (L) 4.22 - 5.81 MIL/uL   Hemoglobin 10.5 (L) 13.0 - 17.0 g/dL   HCT 17.6 (L) 16.0 - 73.7 %   MCV 94.4 80.0 - 100.0 fL   MCH 29.7 26.0 - 34.0 pg   MCHC 31.4 30.0 - 36.0 g/dL   RDW 10.6 26.9 - 48.5 %   Platelets 321 150 - 400 K/uL   nRBC 0.0 0.0 - 0.2 %   Neutrophils Relative % 76 %   Neutro  Abs 5.5 1.7 - 7.7 K/uL   Lymphocytes Relative 9 %   Lymphs Abs 0.6 (L) 0.7 - 4.0 K/uL   Monocytes Relative 10 %   Monocytes Absolute 0.7 0.1 - 1.0 K/uL   Eosinophils Relative 3 %   Eosinophils Absolute 0.2 0.0 - 0.5 K/uL   Basophils Relative 1 %   Basophils Absolute 0.1 0.0 - 0.1 K/uL   Immature Granulocytes 1 %   Abs Immature Granulocytes 0.04 0.00 - 0.07 K/uL    Comment: Performed at [facility], [address], [place], [place] [address]    DG Foot Complete Left  Result Date: 12/08/2020 CLINICAL DATA:  Left foot infection. EXAM: LEFT FOOT - COMPLETE 3+ VIEW COMPARISON:  October 13, 2020. FINDINGS: Status post amputation of fifth metatarsal. Degenerative changes seen involving the first metatarsophalangeal joint. Dislocation of fourth metatarsophalangeal joint is noted. No acute fracture is noted. No lytic destruction is noted to suggest osteomyelitis. IMPRESSION: Stable postsurgical and degenerative changes as described above. Interval development of dislocation of fourth metatarsophalangeal joint. No definite evidence of osteomyelitis. Electronically Signed   By: Lupita Raider M.D.   On: 12/08/2020 12:18    Review of Systems  Constitutional: Positive for chills. Negative for diaphoresis and fever.  HENT: Negative for ear discharge, ear pain, hearing loss and tinnitus.   Eyes: Negative for photophobia and pain.  Respiratory: Negative for cough and shortness of breath.   Cardiovascular: Negative for chest pain.  Gastrointestinal: Negative for abdominal pain, nausea and vomiting.  Genitourinary: Negative for dysuria, flank pain, frequency and urgency.  Musculoskeletal: Negative for arthralgias, back pain, myalgias and neck pain.  Skin: Positive for wound.  Neurological: Negative for dizziness and headaches.  Hematological: Does not bruise/bleed easily.  Psychiatric/Behavioral: The patient is not nervous/anxious.    Blood pressure 134/75, pulse (!) 56, temperature 98.8 F  (37.1 C), temperature source Oral, resp. rate 16, height 6' (1.829 m), weight 93 kg, SpO2 100 %. Physical Exam Constitutional:      General: He is not in acute distress.    Appearance: He is well-developed. He is not diaphoretic.  HENT:     Head: Normocephalic and atraumatic.  Eyes:     General:  No scleral icterus.       Right eye: No discharge.        Left eye: No discharge.     Conjunctiva/sclera: Conjunctivae normal.  Cardiovascular:     Rate and Rhythm: Normal rate and regular rhythm.  Pulmonary:     Effort: Pulmonary effort is normal. No respiratory distress.  Musculoskeletal:     Cervical back: Normal range of motion.  Feet:     Comments: Left foot: Ulceration with purulent discharge dorsal 4th MTP joint, NT. Surrounding erythema, 2+ pitting pedal edema. SPN/DPN paresthetic, TN absent. 2+ DP, 1+ PT Skin:    General: Skin is warm and dry.  Neurological:     Mental Status: He is alert.  Psychiatric:        Behavior: Behavior normal.     Assessment/Plan: Left foot ulcer -- Will get MRI to ensure no bony involvement. Dr. Lajoyce Corners to evaluate either later today or in AM. Pt may eat today from ortho standpoint.    Freeman Caldron, PA-C Orthopedic Surgery [phone] 12/08/2020, 3:11 PM

## 2020-12-08 NOTE — H&P (Signed)
History and Physical:    Robert Huang   AGT:[id number] DOB: [birth year] DOA: 12/08/2020  Referring MD/provider: PA Joy PCP: [name], MD   Patient coming from: Home  Chief Complaint: Worsening diabetic foot infection  History of Present Illness:   Robert Huang is an 60 y.o. male with PMH significant for DM 2, HTN, CAD, hypothyroidism and peripheral vascular disease with multiple episodes of diabetic foot infection and osteomyelitis resulting in amputation of left fifth ray amputation and right great toe in July 2020.  Patient has been being followed closely in wound clinic for infection on the plantar aspect of his left foot between first and second rays.  Patient states that his foot had been cast and was doing well until 2 days ago when he noted onset of fevers and chills.  He subsequently noted pain in his foot which he normally did not have.  2 days ago when they on cast his foot at the wound clinic he noted he had a pocket of pus on the dorsal aspect of his foot which he says he squeezed" pus sprayed all over the drain".  Patient was apparently started on linezolid and ciprofloxacin at the time and patient was discharged home.  Patient now presents to the ED complaining of worsening of his infection with increasing redness and pain of his foot.  He denies any fevers or chills since he was started on the linezolid and ciprofloxacin however notes that his foot looks much more infected than it was previously.   Review of system is negative for malaise, shortness of breath, chest pain, nausea vomiting diarrhea or abdominal pain.  ED Course:  The patient was noted to have cellulitis surrounding the ulcer on the dorsal aspect of his left foot.  Patient was discussed with Dr. Lajoyce Corners who will come and evaluate the patient for further OR debridement versus amputation as warranted.  ROS:   ROS   Review of Systems: Per HPI  Past Medical History:   Past Medical History:   Diagnosis Date  . Anxiety   . CAD (coronary artery disease)    sees Dr. [name], normal Stress test 07-16-12   . Diabetes mellitus   . DJD (degenerative joint disease)   . Gout   . Hyperlipidemia   . Hypertension   . Hypothyroid   . Osteoarthritis   . Renal calculus    hx  . Umbilical hernia     Past Surgical History:   Past Surgical History:  Procedure Laterality Date  . AMPUTATION  07/17/2012   Procedure: AMPUTATION RAY;  Surgeon: [name], MD;  Location: [facility] OR;  Service: Orthopedics;  Laterality: Right;  right hallux amputation (1st ray resection )  . AMPUTATION Left 03/06/2019   Procedure: Ray Amputation left foot;  Surgeon: [name], MD;  Location: [facility] ORS;  Service: Orthopedics;  Laterality: Left;  . CORONARY ANGIOPLASTY WITH STENT PLACEMENT    . FINGER SURGERY     left index finger  . I & D EXTREMITY  05/19/2012   Procedure: IRRIGATION AND DEBRIDEMENT EXTREMITY;  Surgeon: [name], MD;  Location: [facility] OR;  Service: Orthopedics;  Laterality: Right;  Right Great toe  . I & D Toe  05/19/2012   rt great toe  . TONSILLECTOMY      Social History:   Social History   Socioeconomic History  . Marital status: Married    Spouse name: Not on file  . Number of children: Not  on file  . Years of education: Not on file  . Highest education level: Not on file  Occupational History  . Not on file  Tobacco Use  . Smoking status: Never Smoker  . Smokeless tobacco: Current User    Types: Chew  . Tobacco comment: occ  Substance and Sexual Activity  . Alcohol use: Yes    Alcohol/week: 0.0 standard drinks    Comment: weekends  . Drug use: No  . Sexual activity: Yes  Other Topics Concern  . Not on file  Social History Narrative  . Not on file   Social Determinants of Health   Financial Resource Strain: Not on file  Food Insecurity: Not on file  Transportation Needs: Not on file  Physical Activity: Not on file  Stress: Not on file  Social  Connections: Not on file  Intimate Partner Violence: Not on file    Allergies   Patient has no known allergies.  Family history:   Family History  Problem Relation Age of Onset  . Heart attack Mother   . Arthritis Other   . Coronary artery disease Other   . Diabetes Other   . Hypertension Other   . Prostate cancer Other   . Stroke Other     Current Medications:   Prior to Admission medications   Medication Sig Start Date End Date Taking? Authorizing Provider  acetaminophen (TYLENOL) 325 MG tablet Take 2 tablets (650 mg total) by mouth every 6 (six) hours as needed for mild pain (or Fever >/= 101). 03/09/19   Ronaldo MiyamotoKyle, [name], DO  ALPRAZolam (XANAX) 1 MG tablet TAKE 1 TABLET BY MOUTH 3 TIMES DAILY AS NEEDED FOR ANXIETY. 09/22/20   Nelwyn SalisburyFry, Stephen A, MD  amLODipine (NORVASC) 10 MG tablet TAKE 1 TABLET (10 MG TOTAL) BY MOUTH DAILY. <PLEASE MAKE APPOINTMENT FOR REFILLS> 11/21/20   Nelwyn SalisburyFry, Stephen A, MD  Aspirin-Acetaminophen-Caffeine [phone] MG PACK Take by mouth. 07/06/15   [provider]  cloNIDine (CATAPRES) 0.3 MG tablet TAKE 1 TABLET (0.3 MG TOTAL) BY MOUTH 2 (TWO) TIMES DAILY. NEED OV. 11/21/20   Nelwyn SalisburyFry, Stephen A, MD  colchicine 0.6 MG tablet TAKE 1 TABLET (0.6 MG TOTAL) BY MOUTH EVERY 6 (SIX) HOURS AS NEEDED (GOUT). 02/22/20   Nelwyn SalisburyFry, Stephen A, MD  ezetimibe (ZETIA) 10 MG tablet TAKE 1 TABLET BY MOUTH EVERY DAY 11/21/20   Nelwyn SalisburyFry, Stephen A, MD  fenofibrate 160 MG tablet TAKE 1 TABLET (160 MG TOTAL) BY MOUTH DAILY. NEED OV. 11/21/20   Nelwyn SalisburyFry, Stephen A, MD  fluticasone Aleda Grana(FLONASE) 50 MCG/ACT nasal spray Place 2 sprays into both nostrils daily. 08/13/20   Daphine DeutscherMartin, Mary-Margaret, FNP  glipiZIDE (GLUCOTROL) 5 MG tablet glipizide 5 mg tablet  TAKE 1 TABLET (5 MG TOTAL) BY MOUTH 2 (TWO) TIMES DAILY BEFORE A MEAL. 10/03/20   Nelwyn SalisburyFry, Stephen A, MD  HYDROcodone-acetaminophen (NORCO) 10-325 MG tablet Take 1 tablet by mouth every 6 (six) hours as needed for moderate pain. 11/26/20 12/26/20  Nelwyn SalisburyFry, Stephen A, MD   indomethacin (INDOCIN SR) 75 MG CR capsule TAKE 1 CAPSULE BY MOUTH TWICE A DAY WITH A MEAL 10/19/20   Nelwyn SalisburyFry, Stephen A, MD  levothyroxine (SYNTHROID) 112 MCG tablet TAKE 1 TABLET BY MOUTH EVERY DAY 11/21/20   Nelwyn SalisburyFry, Stephen A, MD  lisinopril-hydrochlorothiazide (ZESTORETIC) 20-12.5 MG tablet TAKE 1 TABLET BY MOUTH 2 (TWO) TIMES DAILY. 10AM AND 5PM 07/11/20   Nelwyn SalisburyFry, Stephen A, MD  metFORMIN (GLUCOPHAGE) 1000 MG tablet TAKE 1 TABLET BY MOUTH TWO TIMES A  DAY WITH A MEAL 08/23/20   [name], MD  metoprolol succinate (TOPROL-XL) 100 MG 24 hr tablet TAKE 1 TABLET BY MOUTH TWICE A DAY 07/29/20   [name], MD  nitroGLYCERIN (NITROSTAT) 0.4 MG SL tablet Place 1 tablet (0.4 mg total) under the tongue every 5 (five) minutes as needed. For chest pain. Patient taking differently: Place 0.4 mg under the tongue every 5 (five) minutes as needed for chest pain. 05/21/18   Lewayne Bunting, MD  omeprazole (PRILOSEC) 40 MG capsule Take 1 capsule (40 mg total) by mouth daily. 09/28/20   [name], MD  [facility] ULTRA test strip 1 EACH BY OTHER ROUTE DAILY. USE AS INSTRUCTED 11/01/20   [name], MD  rosuvastatin (CRESTOR) 40 MG tablet TAKE 1 TABLET (40 MG TOTAL) BY MOUTH DAILY. <PLEASE MAKE APPOINTMENT FOR REFILLS> 02/01/20   [name], MD  zolpidem (AMBIEN) 10 MG tablet TAKE 1 TABLET BY MOUTH AT BEDTIME AS NEEDED FOR SLEEP. 07/20/20   [name], MD    Physical Exam:   Vitals:   12/08/20 1137 12/08/20 1351 12/08/20 1400  BP: 130/60 134/75   Pulse: 64 (!) 56   Resp: 16 16   Temp: 98.8 F (37.1 C)    TempSrc: Oral    SpO2: 100% 100%   Weight:   93 kg  Height:   6' (1.829 m)     Physical Exam: Blood pressure 134/75, pulse (!) 56, temperature 98.8 F (37.1 C), temperature source Oral, resp. rate 16, height 6' (1.829 m), weight 93 kg, SpO2 100 %. Gen: Well-appearing gentleman sitting up in his stretcher reading a newspaper in NAD. Eyes: sclera anicteric, conjuctiva mildly injected  bilaterally CVS: S1-S2, regulary, no gallops Respiratory:  decreased air entry likely secondary to decreased inspiratory effort GI: NABS, soft, NT  LE: Patient with stage III ulceration on the dorsal aspect of left foot with stage II ulceration on the plantar aspect.  He has surrounding cellulitis.  See photograph on ED note of PA Joy taken today. Neuro:  grossly nonfocal.  Psych: patient is logical and coherent, judgement and insight appear normal, mood and affect appropriate to situation.   Data Review:    Labs: Basic Metabolic Panel: Recent Labs  Lab 12/08/20 1149  NA 138  K 4.0  CL 107  CO2 20*  GLUCOSE 131*  BUN 14  CREATININE 1.27*  CALCIUM 8.9   Liver Function Tests: Recent Labs  Lab 12/08/20 1149  AST 16  ALT 14  ALKPHOS 56  BILITOT 0.5  PROT 7.1  ALBUMIN 3.0*   No results for input(s): LIPASE, AMYLASE in the last 168 hours. No results for input(s): AMMONIA in the last 168 hours. CBC: Recent Labs  Lab 12/08/20 1149  WBC 7.2  NEUTROABS 5.5  HGB 10.5*  HCT 33.4*  MCV 94.4  PLT 321   Cardiac Enzymes: No results for input(s): CKTOTAL, CKMB, CKMBINDEX, TROPONINI in the last 168 hours.  BNP (last 3 results) No results for input(s): PROBNP in the last 8760 hours. CBG: No results for input(s): GLUCAP in the last 168 hours.  Urinalysis    Component Value Date/Time   COLORURINE YELLOW 03/02/2019 2204   APPEARANCEUR CLEAR 03/02/2019 2204   LABSPEC 1.025 03/02/2019 2204   PHURINE 5.5 03/02/2019 2204   GLUCOSEU NEGATIVE 03/02/2019 2204   HGBUR NEGATIVE 03/02/2019 2204   HGBUR negative 04/16/2007 0904   BILIRUBINUR neg 11/13/2019 1527   KETONESUR NEGATIVE 03/02/2019 2204  PROTEINUR Negative 11/13/2019 1527   PROTEINUR NEGATIVE 03/02/2019 2204   UROBILINOGEN 0.2 11/13/2019 1527   UROBILINOGEN negative 04/16/2007 0904   NITRITE neg 11/13/2019 1527   NITRITE NEGATIVE 03/02/2019 2204   LEUKOCYTESUR Negative 11/13/2019 1527   LEUKOCYTESUR NEGATIVE  03/02/2019 2204      Radiographic Studies: DG Foot Complete Left  Result Date: 12/08/2020 CLINICAL DATA:  Left foot infection. EXAM: LEFT FOOT - COMPLETE 3+ VIEW COMPARISON:  October 13, 2020. FINDINGS: Status post amputation of fifth metatarsal. Degenerative changes seen involving the first metatarsophalangeal joint. Dislocation of fourth metatarsophalangeal joint is noted. No acute fracture is noted. No lytic destruction is noted to suggest osteomyelitis. IMPRESSION: Stable postsurgical and degenerative changes as described above. Interval development of dislocation of fourth metatarsophalangeal joint. No definite evidence of osteomyelitis. Electronically Signed   By: Lupita Raider M.D.   On: 12/08/2020 12:18    EKG: Ordered and pending   Assessment/Plan:   Principal Problem:   Diabetic foot infection (HCC) Active Problems:   Hypothyroidism   Diabetes mellitus with complication (HCC)  60 year old gentleman with known vasculopathy and DM2 presents with worsening of diabetic foot infection of left foot which has failed outpatient treatment.  Diabetic foot infection Although x-ray is not suggestive of osteomyelitis, it is possible to probe to bone from ulcer. Will order MRI to further evaluate. Start vancomycin, cefepime and Flagyl Dr. Lajoyce Corners see patient for further evaluation.  Anemia Of note patient's hemoglobin has decreased from 12.3-10.5 over the past 2 months Patient is hemodynamically stable This is a normocytic anemia which may be secondary to chronic disease, however will send iron studies, B12 folate and stool for guaiac.  DM2 SSI AC at bedtime Hold glipizide  HTN Continue amlodipine, clonidine, metoprolol, Zestoretic per home doses  Generalized anxiety disorder Continue Xanax per home doses  Hypothyroidism Order Synthroid  Dyslipidemia Continue Zetia, fenofibrate and atorvastatin  Gout Continue colchicine per home doses hold indomethacin    Other  information:   DVT prophylaxis: Subcu heparin ordered. Code Status: Full Family Communication: None Disposition Plan: Home Consults called: Dr. Lajoyce Corners Admission status: Inpatient  Horatio Pel Orma Flaming [facility]  If 7PM-7AM, please contact night-coverage [email or web address] Password [facility] 12/08/2020, 4:32 PM

## 2020-12-09 ENCOUNTER — Encounter (HOSPITAL_BASED_OUTPATIENT_CLINIC_OR_DEPARTMENT_OTHER): Payer: 59 | Admitting: Internal Medicine

## 2020-12-09 DIAGNOSIS — L089 Local infection of the skin and subcutaneous tissue, unspecified: Secondary | ICD-10-CM

## 2020-12-09 DIAGNOSIS — L03116 Cellulitis of left lower limb: Secondary | ICD-10-CM

## 2020-12-09 DIAGNOSIS — Z89422 Acquired absence of other left toe(s): Secondary | ICD-10-CM | POA: Diagnosis not present

## 2020-12-09 DIAGNOSIS — E11628 Type 2 diabetes mellitus with other skin complications: Secondary | ICD-10-CM | POA: Diagnosis not present

## 2020-12-09 DIAGNOSIS — M869 Osteomyelitis, unspecified: Secondary | ICD-10-CM

## 2020-12-09 DIAGNOSIS — E039 Hypothyroidism, unspecified: Secondary | ICD-10-CM

## 2020-12-09 DIAGNOSIS — E118 Type 2 diabetes mellitus with unspecified complications: Secondary | ICD-10-CM

## 2020-12-09 DIAGNOSIS — Z89411 Acquired absence of right great toe: Secondary | ICD-10-CM

## 2020-12-09 LAB — CBC
HCT: 29.3 % — ABNORMAL LOW (ref 39.0–52.0)
Hemoglobin: 9.3 g/dL — ABNORMAL LOW (ref 13.0–17.0)
MCH: 30 pg (ref 26.0–34.0)
MCHC: 31.7 g/dL (ref 30.0–36.0)
MCV: 94.5 fL (ref 80.0–100.0)
Platelets: 283 10*3/uL (ref 150–400)
RBC: 3.1 MIL/uL — ABNORMAL LOW (ref 4.22–5.81)
RDW: 15 % (ref 11.5–15.5)
WBC: 5.3 10*3/uL (ref 4.0–10.5)
nRBC: 0 % (ref 0.0–0.2)

## 2020-12-09 LAB — URINALYSIS, ROUTINE W REFLEX MICROSCOPIC
Bilirubin Urine: NEGATIVE
Glucose, UA: NEGATIVE mg/dL
Hgb urine dipstick: NEGATIVE
Ketones, ur: NEGATIVE mg/dL
Leukocytes,Ua: NEGATIVE
Nitrite: NEGATIVE
Protein, ur: NEGATIVE mg/dL
Specific Gravity, Urine: 1.009 (ref 1.005–1.030)
pH: 5 (ref 5.0–8.0)

## 2020-12-09 LAB — FOLATE: Folate: 7.9 ng/mL (ref >5.9)

## 2020-12-09 LAB — IRON AND TIBC
Iron: 32 ug/dL — ABNORMAL LOW (ref 45–182)
Saturation Ratios: 10 % — ABNORMAL LOW (ref 17.9–39.5)
TIBC: 333 ug/dL (ref 250–450)
UIBC: 301 ug/dL

## 2020-12-09 LAB — FERRITIN: Ferritin: 56 ng/mL (ref 24–336)

## 2020-12-09 LAB — GLUCOSE, CAPILLARY
Glucose-Capillary: 149 mg/dL — ABNORMAL HIGH (ref 70–99)
Glucose-Capillary: 133 mg/dL — ABNORMAL HIGH (ref 70–99)
Glucose-Capillary: 121 mg/dL — ABNORMAL HIGH (ref 70–99)
Glucose-Capillary: 107 mg/dL — ABNORMAL HIGH (ref 70–99)

## 2020-12-09 LAB — BASIC METABOLIC PANEL
Anion gap: 5 (ref 5–15)
BUN: 18 mg/dL (ref 6–20)
CO2: 20 mmol/L — ABNORMAL LOW (ref 22–32)
Calcium: 8.4 mg/dL — ABNORMAL LOW (ref 8.9–10.3)
Chloride: 109 mmol/L (ref 98–111)
Creatinine, Ser: 1.16 mg/dL (ref 0.61–1.24)
GFR, Estimated: >60 mL/min (ref >60)
Glucose, Bld: 136 mg/dL — ABNORMAL HIGH (ref 70–99)
Potassium: 3.1 mmol/L — ABNORMAL LOW (ref 3.5–5.1)
Sodium: 134 mmol/L — ABNORMAL LOW (ref 135–145)

## 2020-12-09 LAB — OCCULT BLOOD X 1 CARD TO LAB, STOOL: Fecal Occult Bld: POSITIVE — AB

## 2020-12-09 LAB — SURGICAL PCR SCREEN
MRSA, PCR: NEGATIVE
Staphylococcus aureus: NEGATIVE

## 2020-12-09 LAB — VITAMIN B12: Vitamin B-12: 57 pg/mL — ABNORMAL LOW (ref 180–914)

## 2020-12-09 LAB — SARS CORONAVIRUS 2 (TAT 6-24 HRS): SARS Coronavirus 2: NEGATIVE

## 2020-12-09 MED ORDER — CYANOCOBALAMIN 1000 MCG/ML IJ SOLN
1000.0000 ug | Freq: Every day | INTRAMUSCULAR | Status: DC
  Administered 2020-12-09 – 2020-12-10 (×2): 1000 ug via SUBCUTANEOUS
  Filled 2020-12-09 (×3): qty 1

## 2020-12-09 MED ORDER — POTASSIUM CHLORIDE CRYS ER 10 MEQ PO TBCR
EXTENDED_RELEASE_TABLET | ORAL | Status: AC
  Filled 2020-12-09: qty 2

## 2020-12-09 MED ORDER — POVIDONE-IODINE 10 % EX SWAB: 2.0000 "application " | Freq: Once | CUTANEOUS | Status: DC

## 2020-12-09 MED ORDER — CEFAZOLIN SODIUM-DEXTROSE 2-4 GM/100ML-% IV SOLN
2.0000 g | INTRAVENOUS | Status: DC
  Filled 2020-12-09 (×2): qty 100

## 2020-12-09 MED ORDER — CHLORHEXIDINE GLUCONATE 4 % EX LIQD: 60.0000 mL | Freq: Once | CUTANEOUS | Status: DC

## 2020-12-09 MED ORDER — CHLORHEXIDINE GLUCONATE 4 % EX LIQD
60.0000 mL | Freq: Once | CUTANEOUS | Status: AC
  Administered 2020-12-10: 4 via TOPICAL
  Filled 2020-12-09 (×2): qty 60

## 2020-12-09 MED ORDER — MUPIROCIN 2 % EX OINT
1.0000 "application " | TOPICAL_OINTMENT | Freq: Two times a day (BID) | CUTANEOUS | Status: DC
  Administered 2020-12-09 – 2020-12-11 (×4): 1 via NASAL
  Filled 2020-12-09 (×2): qty 22

## 2020-12-09 MED ORDER — POTASSIUM CHLORIDE CRYS ER 20 MEQ PO TBCR
40.0000 meq | EXTENDED_RELEASE_TABLET | Freq: Once | ORAL | Status: AC
  Administered 2020-12-09: 40 meq via ORAL
  Filled 2020-12-09: qty 2

## 2020-12-09 NOTE — Consult Note (Signed)
ORTHOPAEDIC CONSULTATION  REQUESTING PHYSICIAN: Maretta Bees, MD  Chief Complaint: Ulceration cellulitis and drainage left foot.  HPI: Robert Huang is a 60 y.o. male who presents with chronic ulcer fourth metatarsal head left foot.  Patient has diabetic insensate neuropathy status post right great toe amputation and status post left foot fifth ray amputation.  Patient has had a chronic ulcer beneath the fourth metatarsal head he has developed cellulitis ulceration and further drainage and was brought to the hospital for evaluation and treatment.  Past Medical History:  Diagnosis Date  . Anxiety   . CAD (coronary artery disease)    sees Dr. [name], normal Stress test 07-16-12   . Diabetes mellitus   . DJD (degenerative joint disease)   . Gout   . Hyperlipidemia   . Hypertension   . Hypothyroid   . Osteoarthritis   . Renal calculus    hx  . Umbilical hernia    Past Surgical History:  Procedure Laterality Date  . AMPUTATION  07/17/2012   Procedure: AMPUTATION RAY;  Surgeon: [name], MD;  Location: [facility] OR;  Service: Orthopedics;  Laterality: Right;  right hallux amputation (1st ray resection )  . AMPUTATION Left 03/06/2019   Procedure: Ray Amputation left foot;  Surgeon: [name], MD;  Location: [facility] ORS;  Service: Orthopedics;  Laterality: Left;  . CORONARY ANGIOPLASTY WITH STENT PLACEMENT    . FINGER SURGERY     left index finger  . I & D EXTREMITY  05/19/2012   Procedure: IRRIGATION AND DEBRIDEMENT EXTREMITY;  Surgeon: [name], MD;  Location: [facility] OR;  Service: Orthopedics;  Laterality: Right;  Right Great toe  . I & D Toe  05/19/2012   rt great toe  . TONSILLECTOMY     Social History   Socioeconomic History  . Marital status: Married    Spouse name: Not on file  . Number of children: Not on file  . Years of education: Not on file  . Highest education level: Not on file  Occupational History  . Not on file  Tobacco Use  . Smoking  status: Never Smoker  . Smokeless tobacco: Current User    Types: Chew  . Tobacco comment: occ  Substance and Sexual Activity  . Alcohol use: Yes    Alcohol/week: 0.0 standard drinks    Comment: weekends  . Drug use: No  . Sexual activity: Yes  Other Topics Concern  . Not on file  Social History Narrative  . Not on file   Social Determinants of Health   Financial Resource Strain: Not on file  Food Insecurity: Not on file  Transportation Needs: Not on file  Physical Activity: Not on file  Stress: Not on file  Social Connections: Not on file   Family History  Problem Relation Age of Onset  . Heart attack Mother   . Arthritis Other   . Coronary artery disease Other   . Diabetes Other   . Hypertension Other   . Prostate cancer Other   . Stroke Other    - negative except otherwise stated in the family history section No Known Allergies Prior to Admission medications   Medication Sig Start Date End Date Taking? Authorizing Provider  ALPRAZolam (XANAX) 1 MG tablet TAKE 1 TABLET BY MOUTH 3 TIMES DAILY AS NEEDED FOR ANXIETY. Patient taking differently: Take 1 mg by mouth 3 (three) times daily. 09/22/20  Yes [name], MD  amLODipine (NORVASC) 10 MG tablet  TAKE 1 TABLET (10 MG TOTAL) BY MOUTH DAILY. <PLEASE MAKE APPOINTMENT FOR REFILLS> 11/21/20  Yes [name], MD  Aspirin-Acetaminophen-Caffeine [phone] MG PACK Take 1 Package by mouth daily as needed (pain). 07/06/15  Yes [provider]  ciprofloxacin (CIPRO) 500 MG tablet Take 500 mg by mouth 2 (two) times daily. Start date : 12/06/20 12/06/20  Yes [provider]  cloNIDine (CATAPRES) 0.3 MG tablet TAKE 1 TABLET (0.3 MG TOTAL) BY MOUTH 2 (TWO) TIMES DAILY. NEED OV. Patient taking differently: Take 0.3 mg by mouth 2 (two) times daily. 11/21/20  Yes [name], MD  colchicine 0.6 MG tablet TAKE 1 TABLET (0.6 MG TOTAL) BY MOUTH EVERY 6 (SIX) HOURS AS NEEDED (GOUT). 02/22/20  Yes [name], MD   ezetimibe (ZETIA) 10 MG tablet TAKE 1 TABLET BY MOUTH EVERY DAY Patient taking differently: Take 10 mg by mouth daily. 11/21/20  Yes [name], MD  fenofibrate 160 MG tablet TAKE 1 TABLET (160 MG TOTAL) BY MOUTH DAILY. NEED OV. Patient taking differently: Take 160 mg by mouth daily. 11/21/20  Yes [name], MD  glipiZIDE (GLUCOTROL) 5 MG tablet glipizide 5 mg tablet  TAKE 1 TABLET (5 MG TOTAL) BY MOUTH 2 (TWO) TIMES DAILY BEFORE A MEAL. Patient taking differently: Take 2.5 mg by mouth 2 (two) times daily before a meal. 10/03/20  Yes [name], MD  HYDROcodone-acetaminophen (NORCO) 10-325 MG tablet Take 1 tablet by mouth every 6 (six) hours as needed for moderate pain. 11/26/20 12/26/20 Yes [name], MD  indomethacin (INDOCIN SR) 75 MG CR capsule TAKE 1 CAPSULE BY MOUTH TWICE A DAY WITH A MEAL Patient taking differently: Take 75 mg by mouth daily as needed for moderate pain. 10/19/20  Yes [name], MD  levothyroxine (SYNTHROID) 112 MCG tablet TAKE 1 TABLET BY MOUTH EVERY DAY Patient taking differently: Take 112 mcg by mouth daily before breakfast. 11/21/20  Yes [name], MD  linezolid (ZYVOX) 600 MG tablet Take 600 mg by mouth 2 (two) times daily. Start date : 12/06/20 12/06/20  Yes [provider]  lisinopril-hydrochlorothiazide (ZESTORETIC) 20-12.5 MG tablet TAKE 1 TABLET BY MOUTH 2 (TWO) TIMES DAILY. 10AM AND 5PM Patient taking differently: Take 1 tablet by mouth 2 (two) times daily. 07/11/20  Yes [name], MD  metFORMIN (GLUCOPHAGE) 1000 MG tablet TAKE 1 TABLET BY MOUTH TWO TIMES A DAY WITH A MEAL Patient taking differently: Take 1,000 mg by mouth 2 (two) times daily with a meal. 08/23/20  Yes [name], MD  metoprolol succinate (TOPROL-XL) 100 MG 24 hr tablet TAKE 1 TABLET BY MOUTH TWICE A DAY Patient taking differently: Take 100 mg by mouth 2 (two) times daily. 07/29/20  Yes [name], MD  nitroGLYCERIN (NITROSTAT) 0.4 MG SL tablet Place 1  tablet (0.4 mg total) under the tongue every 5 (five) minutes as needed. For chest pain. Patient taking differently: Place 0.4 mg under the tongue every 5 (five) minutes as needed for chest pain. 05/21/18  Yes Lewayne Bunting, MD  omeprazole (PRILOSEC) 40 MG capsule Take 1 capsule (40 mg total) by mouth daily. 09/28/20  Yes [name], MD  rosuvastatin (CRESTOR) 40 MG tablet TAKE 1 TABLET (40 MG TOTAL) BY MOUTH DAILY. <PLEASE MAKE APPOINTMENT FOR REFILLS> Patient taking differently: Take 40 mg by mouth daily. 02/01/20  Yes [name], MD  zolpidem (AMBIEN) 10 MG tablet TAKE 1 TABLET BY MOUTH AT BEDTIME  AS NEEDED FOR SLEEP. Patient taking differently: Take 10 mg by mouth at bedtime as needed for sleep. 07/20/20  Yes Fry, Stephen A, MD  acetaminophen (TYLENOL) 325 MG tablet Take 2 tablets (650 mg total) by mouth every 6 (six) hours as needed for mild pain (or Fever >/= 101). Patient not taking: No sig reported 03/09/19   Kyle, [name], DO  fluticasone (FLONASE) 50 MCG/ACT nasal spray Place 2 sprays into both nostrils daily. Patient not taking: No sig reported 08/13/20   Martin, Mary-Margaret, FNP  ONETOUCH ULTRA test strip 1 EACH BY OTHER ROUTE DAILY. USE AS INSTRUCTED 11/01/20   Fry, Stephen A, MD   DG Foot Complete Left  Result Date: 12/08/2020 CLINICAL DATA:  Left foot infection. EXAM: LEFT FOOT - COMPLETE 3+ VIEW COMPARISON:  October 13, 2020. FINDINGS: Status post amputation of fifth metatarsal. Degenerative changes seen involving the first metatarsophalangeal joint. Dislocation of fourth metatarsophalangeal joint is noted. No acute fracture is noted. No lytic destruction is noted to suggest osteomyelitis. IMPRESSION: Stable postsurgical and degenerative changes as described above. Interval development of dislocation of fourth metatarsophalangeal joint. No definite evidence of osteomyelitis. Electronically Signed   By: James  Green Jr M.D.   On: 12/08/2020 12:18   - pertinent xrays, CT, MRI  studies were reviewed and independently interpreted  Positive ROS: All other systems have been reviewed and were otherwise negative with the exception of those mentioned in the HPI and as above.  Physical Exam: General: Alert, no acute distress Psychiatric: Patient is competent for consent with normal mood and affect Lymphatic: No axillary or cervical lymphadenopathy Cardiovascular: No pedal edema Respiratory: No cyanosis, no use of accessory musculature GI: No organomegaly, abdomen is soft and non-tender    Images:  @ENCIMAGES@  Labs:  Lab Results  Component Value Date   HGBA1C 6.6 (H) 12/08/2020   HGBA1C 7.5 (H) 09/28/2020   HGBA1C 7.6 (A) 10/19/2019   ESRSEDRATE 50 (H) 03/02/2019   ESRSEDRATE 63 (H) 07/17/2012   CRP 2.9 (H) 03/02/2019   CRP 1.3 (H) 07/17/2012   LABURIC 7.2 12/03/2017   LABURIC 7.6 01/11/2017   LABURIC 12.7 (H) 11/05/2014   REPTSTATUS 12/08/2020 FINAL 12/06/2020   GRAMSTAIN  12/06/2020    FEW WBC PRESENT, PREDOMINANTLY PMN FEW GRAM POSITIVE COCCI IN PAIRS IN CLUSTERS    CULT  12/06/2020    FEW STREPTOCOCCUS AGALACTIAE TESTING AGAINST S. AGALACTIAE NOT ROUTINELY PERFORMED DUE TO PREDICTABILITY OF AMP/PEN/VAN SUSCEPTIBILITY. WITHIN MIXED ORGANISMS Performed at [facility], [address] [place], [place] [address]    LABORGA METHICILLIN RESISTANT STAPHYLOCOCCUS AUREUS 10/13/2020   LABORGA ENTEROCOCCUS FAECALIS 10/13/2020    Lab Results  Component Value Date   ALBUMIN 3.0 (L) 12/08/2020   ALBUMIN 3.9 09/28/2020   ALBUMIN 3.5 03/07/2019   LABURIC 7.2 12/03/2017   LABURIC 7.6 01/11/2017   LABURIC 12.7 (H) 11/05/2014    No results found for: CBC  Neurologic: Patient does not have protective sensation bilateral lower extremities.   MUSCULOSKELETAL:   Skin: Examination there is cellulitis and necrotic skin dorsally over the MTP joint fourth toe left foot.  Patient's blood sugars are under good control hemoglobin A1c 6.6  Patient  has a strong dorsalis pedis pulse bilaterally.  On the plantar aspect the left foot patient has an ulcer that probes to bone beneath the fourth metatarsal head consistent with osteomyelitis.  Review the radiographs show no destructive bony changes.  His MRI scan from 2 months ago was reviewed which showed   no deep abscess no osteomyelitis.  Patient canceled his MRI scan during this admission due to the previous MRI scan being inconclusive.  Assessment: Assessment: Osteomyelitis left foot fourth metatarsal head status post fifth ray amputation with diabetic insensate neuropathy.  Plan: Plan: Discussed treatment options.  I feel the patient's best option is to proceed with a transmetatarsal amputation.  Risk and benefits were discussed including the importance of nonweightbearing for 3 weeks.  Patient states he understands wished to proceed with surgery we will plan for surgery tomorrow, Saturday morning.  Thank you for the consult and the opportunity to see Mr. Ellie Spickler, MD [facility] [phone] 8:18 AM

## 2020-12-09 NOTE — Anesthesia Preprocedure Evaluation (Addendum)
Anesthesia Evaluation  Patient identified by MRN, date of birth, ID band Patient awake    Reviewed: Allergy & Precautions, H&P , NPO status , Patient's Chart, lab work & pertinent test results  Airway Mallampati: II  TM Distance: >3 FB Neck ROM: Full    Dental no notable dental hx. (+) Teeth Intact, Dental Advisory Given   Pulmonary neg pulmonary ROS,    Pulmonary exam normal breath sounds clear to auscultation       Cardiovascular Exercise Tolerance: Good hypertension, Pt. on medications and Pt. on home beta blockers + CAD and + Cardiac Stents   Rhythm:Regular Rate:Normal     Neuro/Psych Anxiety negative neurological ROS     GI/Hepatic negative GI ROS, Neg liver ROS,   Endo/Other  diabetes, Oral Hypoglycemic AgentsHypothyroidism   Renal/GU negative Renal ROS  negative genitourinary   Musculoskeletal  (+) Arthritis , Osteoarthritis,    Abdominal   Peds  Hematology negative hematology ROS (+)   Anesthesia Other Findings   Reproductive/Obstetrics negative OB ROS                            Anesthesia Physical Anesthesia Plan  ASA: III  Anesthesia Plan: MAC and Regional   Post-op Pain Management:    Induction: Intravenous  PONV Risk Score and Plan: 1 and Propofol infusion  Airway Management Planned: Simple Face Mask  Additional Equipment:   Intra-op Plan:   Post-operative Plan:   Informed Consent: I have reviewed the patients History and Physical, chart, labs and discussed the procedure including the risks, benefits and alternatives for the proposed anesthesia with the patient or authorized representative who has indicated his/her understanding and acceptance.     Dental advisory given  Plan Discussed with: CRNA  Anesthesia Plan Comments:        Anesthesia Quick Evaluation

## 2020-12-09 NOTE — Consult Note (Signed)
WOC Nurse Consult Note: Reason for Consult: WOC Nurse is simultaneously consulted with Orthopedics for worsening left lateral foot ulceration.  Orthopedic PA has seen and Dre. DUda is to assess this am. Wound type: Infectious, neuropathic Pressure Injury POA: N/A Measurement: To be obtained by Bedside RN today and documented on Nursing Flow Sheet in LxWxD in cm Wound bed: See photos in EMR taken yesterday Drainage (amount, consistency, odor) moderate serous Periwound: erythema, edema Dressing procedure/placement/frequency: I have implemented a conservative POC using a soap and water cleanse followed by an antimicrobial nonadherent (xeroform) secured with Kerlix once daily.  Dr. Lajoyce Corners to evaluate today.   WOC nursing team will not follow, but will remain available to this patient, the nursing and medical teams.  Please re-consult if needed. Thanks, Ladona Mow, MSN, RN, GNP, [facility] [place]  Pager# [phone]

## 2020-12-09 NOTE — Progress Notes (Signed)
PROGRESS NOTE        PATIENT DETAILS Name: Robert Huang GEZMOQHUTM Age: 60 y.o. Sex: male Date of Birth: Jun 06, 1961 Admit Date: 12/08/2020 Admitting Physician Vashti Hey, MD PCP:Fry, Ishmael Holter, MD  Brief Narrative: Patient is a 60 y.o. male with history of CAD, DM-2, HTN, hypothyroidism, HLD-chronic left foot ulcer (beneath fourth metatarsal head)-presenting with diabetic foot infection with acute osteomyelitis.   Significant events: 4/15>> admit to [facility]-diabetic foot infection/acute osteo of left foot metatarsal head  Significant studies: 4/14>> x-ray left foot: dislocation of fourth metatarsal joint-no evidence of osteo  Antimicrobial therapy: Vancomycin: 4/14>> Cefepime: 4/14>> Flagyl: 4/14>>  Microbiology data: 4/14>> Covid PCR: Negative  Procedures : None  Consults: Orthopedics  DVT Prophylaxis : heparin injection 5,000 Units Start: 12/08/20 2200   Subjective: No major issues overnight-denies any chest pain or shortness of breath.  Assessment/Plan: Left diabetic foot with osteomyelitis of left fourth metatarsal head (probe test positive): Continue IV antibiotics-evaluated by orthopedics-for transmetatarsal amputation tomorrow.  Normocytic anemia: Due to chronic left foot wound-no evidence of blood loss.  Vitamin B12 deficiency: Will begin supplementation   Hypokalemia: Replete and recheck.  HTN: BP controlled-continue amlodipine, clonidine, lisinopril, HCTZ-follow and adjust  HLD: Continue statin, Zetia and fenofibrate  CAD: No anginal symptoms-Per patient-he is easily able to walk around and climb a couple of flight of stairs without any symptoms.  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 Yes  Diet effective now                  Code Status: Full code   Family Communication: None at bedside  Disposition Plan: Status is: Inpatient  Remains inpatient appropriate because:Inpatient level of care appropriate due to severity of illness   Dispo: The patient is from: Home              Anticipated d/c is to: Home              Patient currently is not medically stable to d/c.   Difficult to place patient No   Barriers to Discharge: Left diabetic foot infection with osteo-requiring transmetatarsal amputation  Antimicrobial agents: Anti-infectives (From admission, onward)   Start     Dose/Rate Route Frequency Ordered Stop   12/09/20 1530  vancomycin (VANCOREADY) IVPB 2000 mg/400 mL  Status:  Discontinued        2,000 mg 200 mL/hr over 120 Minutes Intravenous Every 24 hours 12/08/20 1511 12/08/20 1539   12/08/20 2300  piperacillin-tazobactam (ZOSYN) IVPB 3.375 g  Status:  Discontinued       "Followed by" Linked Group Details   3.375 g 12.5 mL/hr over 240 Minutes Intravenous Every 8 hours 12/08/20 1452 12/08/20 1503   12/08/20 1600  vancomycin (VANCOREADY) IVPB 2000 mg/400 mL        2,000 mg 200 mL/hr over 120 Minutes Intravenous Every 24 hours 12/08/20 1539     12/08/20 1530  metroNIDAZOLE (FLAGYL)  IVPB 500 mg        500 mg 100 mL/hr over 60 Minutes Intravenous Every 8 hours 12/08/20 1448     12/08/20 1530  ceFEPIme (MAXIPIME) 2 g in sodium chloride 0.9 % 100 mL IVPB        2 g 200 mL/hr over 30 Minutes Intravenous Every 8 hours 12/08/20 1507     12/08/20 1500  piperacillin-tazobactam (ZOSYN) IVPB 3.375 g  Status:  Discontinued       "Followed by" Linked Group Details   3.375 g 100 mL/hr over 30 Minutes Intravenous  Once 12/08/20 1452 12/08/20 1503   12/08/20 1500  vancomycin (VANCOREADY) IVPB  2000 mg/400 mL  Status:  Discontinued        2,000 mg 200 mL/hr over 120 Minutes Intravenous  Once 12/08/20 1455 12/08/20 1511       Time spent: 25- minutes-Greater than 50% of this time was spent in counseling, explanation of diagnosis, planning of further management, and coordination of care.  MEDICATIONS: Scheduled Meds: . amLODipine  10 mg Oral Daily  . cloNIDine  0.3 mg Oral BID  . ezetimibe  10 mg Oral Daily  . fenofibrate  160 mg Oral Daily  . heparin  5,000 Units Subcutaneous Q8H  . lisinopril  20 mg Oral BID   And  . hydrochlorothiazide  12.5 mg Oral BID  . insulin aspart  0-15 Units Subcutaneous TID WC  . levothyroxine  112 mcg Oral QAC breakfast  . metoprolol succinate  100 mg Oral BID  . pantoprazole  40 mg Oral Daily  . potassium chloride      . rosuvastatin  40 mg Oral Daily   Continuous Infusions: . ceFEPime (MAXIPIME) IV 2 g (12/09/20 0917)  . metronidazole 500 mg (12/09/20 0927)  . vancomycin 2,000 mg (12/08/20 1714)   PRN Meds:.acetaminophen **OR** acetaminophen, ALPRAZolam, colchicine, HYDROcodone-acetaminophen, polyethylene glycol, zolpidem   PHYSICAL EXAM: Vital signs: Vitals:   12/08/20 2157 12/08/20 2158 12/09/20 0008 12/09/20 0350  BP: 131/75  117/66 (!) 127/57  Pulse:  65 60 60  Resp:   20 17  Temp:   98.6 F (37 C) 98.6 F (37 C)  TempSrc:   Oral Oral  SpO2:   98% 100%  Weight:      Height:       Filed Weights   12/08/20 1400  Weight: 93 kg   Body mass index is 27.8 kg/m.   Gen Exam:Alert awake-not in any distress HEENT:atraumatic, normocephalic Chest: B/L clear to auscultation anteriorly CVS:S1S2 regular Abdomen:soft non tender, non distended Extremities:no edema Neurology: Non focal Skin: no rash  I have personally reviewed following labs and imaging studies  LABORATORY DATA: CBC: Recent Labs  Lab 12/08/20 1149 12/08/20 1805 12/09/20 0111  WBC 7.2 6.1 5.3  NEUTROABS 5.5  --   --   HGB 10.5* 10.3* 9.3*  HCT 33.4*  32.9* 29.3*  MCV 94.4 96.2 94.5  PLT 321 347 921    Basic Metabolic Panel: Recent Labs  Lab 12/08/20 1149 12/08/20 1805 12/09/20 0111  NA 138  --  134*  K 4.0  --  3.1*  CL 107  --  109  CO2 20*  --  20*  GLUCOSE 131*  --  136*  BUN 14  --  18  CREATININE 1.27* 1.29* 1.16  CALCIUM 8.9  --  8.4*    GFR: Estimated Creatinine Clearance: 74.3 mL/min (by C-G formula based on SCr of 1.16 mg/dL).  Liver Function Tests: Recent Labs  Lab 12/08/20 1149  AST 16  ALT 14  ALKPHOS 56  BILITOT 0.5  PROT 7.1  ALBUMIN 3.0*   No results for input(s): LIPASE, AMYLASE in the last 168 hours. No results for input(s): AMMONIA in the last 168 hours.  Coagulation Profile: No results for input(s): INR, PROTIME in the last 168 hours.  Cardiac Enzymes: No results for input(s): CKTOTAL, CKMB, CKMBINDEX, TROPONINI in the last 168 hours.  BNP (last 3 results) No results for input(s): PROBNP in the last 8760 hours.  Lipid Profile: No results for input(s): CHOL, HDL, LDLCALC, TRIG, CHOLHDL, LDLDIRECT in the last 72 hours.  Thyroid Function Tests: No results for input(s): TSH, T4TOTAL, FREET4, T3FREE, THYROIDAB in the last 72 hours.  Anemia Panel: Recent Labs    12/09/20 0111  VITAMINB12 57*  FOLATE 7.9  FERRITIN 56  TIBC 333  IRON 32*    Urine analysis:    Component Value Date/Time   COLORURINE COLORLESS (A) 12/08/2020 2350   APPEARANCEUR CLEAR 12/08/2020 2350   LABSPEC 1.009 12/08/2020 2350   PHURINE 5.0 12/08/2020 2350   GLUCOSEU NEGATIVE 12/08/2020 2350   HGBUR NEGATIVE 12/08/2020 2350   HGBUR negative 04/16/2007 0904   BILIRUBINUR NEGATIVE 12/08/2020 2350   BILIRUBINUR neg 11/13/2019 1527   KETONESUR NEGATIVE 12/08/2020 2350   PROTEINUR NEGATIVE 12/08/2020 2350   UROBILINOGEN 0.2 11/13/2019 1527   UROBILINOGEN negative 04/16/2007 0904   NITRITE NEGATIVE 12/08/2020 2350   LEUKOCYTESUR NEGATIVE 12/08/2020 2350    Sepsis Labs: Lactic Acid, Venous    Component  Value Date/Time   LATICACIDVEN 1.4 12/08/2020 1149    MICROBIOLOGY: Recent Results (from the past 240 hour(s))  Aerobic Culture w Gram Stain (superficial specimen)     Status: None   Collection Time: 12/06/20 12:58 PM   Specimen: Wound  Result Value Ref Range Status   Specimen Description   Final    WOUND LEFT 4TH MET HEAD Performed at [facility], [place] [address] [place], [place] [address]    Special Requests   Final    NONE Performed at [facility], [place] [address] [place], [place] [address]    Gram Stain   Final    FEW WBC PRESENT, PREDOMINANTLY PMN FEW GRAM POSITIVE COCCI IN PAIRS IN CLUSTERS    Culture   Final    FEW STREPTOCOCCUS AGALACTIAE TESTING AGAINST S. AGALACTIAE NOT ROUTINELY PERFORMED DUE TO PREDICTABILITY OF AMP/PEN/VAN SUSCEPTIBILITY. WITHIN MIXED ORGANISMS Performed at [facility], [place] [address] [place], [place] [address]    Report Status 12/08/2020 FINAL  Final  SARS CORONAVIRUS 2 (TAT 6-24 HRS) Nasopharyngeal Nasopharyngeal Swab     Status: None   Collection Time: 12/08/20  3:27 PM   Specimen: Nasopharyngeal Swab  Result Value Ref Range Status   SARS Coronavirus 2 NEGATIVE NEGATIVE Final    Comment: (NOTE) SARS-CoV-2 target nucleic acids are NOT DETECTED.  The SARS-CoV-2 RNA is generally detectable in upper and lower respiratory specimens during the acute phase of infection. Negative results do not preclude SARS-CoV-2 infection, do not rule out co-infections with other pathogens, and should not be used as the sole basis for treatment or other patient management decisions. Negative results must be combined with clinical observations, patient history, and epidemiological information. The expected result is Negative.  Fact Sheet for Patients: SugarRoll.be  Fact Sheet for Healthcare Providers: https://www.woods-mathews.com/  This test is not yet approved or  cleared by the [place] FDA and  has been authorized for  detection and/or diagnosis of SARS-CoV-2 by FDA under an Emergency Use Authorization (EUA). This EUA will remain  in effect (meaning this test can be used) for the duration of the COVID-19 declaration under Se ction 564(b)(1) of the Act, 21 U.S.C. section 360bbb-3(b)(1), unless the authorization is terminated or revoked sooner.  Performed at [facility], [place] [address], [place],  [address]     RADIOLOGY STUDIES/RESULTS: DG Foot Complete Left  Result Date: 12/08/2020 CLINICAL DATA:  Left foot infection. EXAM: LEFT FOOT - COMPLETE 3+ VIEW COMPARISON:  October 13, 2020. FINDINGS: Status post amputation of fifth metatarsal. Degenerative changes seen involving the first metatarsophalangeal joint. Dislocation of fourth metatarsophalangeal joint is noted. No acute fracture is noted. No lytic destruction is noted to suggest osteomyelitis. IMPRESSION: Stable postsurgical and degenerative changes as described above. Interval development of dislocation of fourth metatarsophalangeal joint. No definite evidence of osteomyelitis. Electronically Signed   By: Marijo Conception M.D.   On: 12/08/2020 12:18     LOS: 1 day   Oren Binet, MD  [facility]    To contact the attending provider between 7A-7P or the covering provider during after hours 7P-7A, please log into the web site [email or web address] and access using universal [place] password for that web site. If you do not have the password, please call the hospital operator.  12/09/2020, 9:45 AM

## 2020-12-09 NOTE — H&P (View-Only) (Signed)
ORTHOPAEDIC CONSULTATION  REQUESTING PHYSICIAN: Maretta Bees, MD  Chief Complaint: Ulceration cellulitis and drainage left foot.  HPI: Robert Huang is a 60 y.o. male who presents with chronic ulcer fourth metatarsal head left foot.  Patient has diabetic insensate neuropathy status post right great toe amputation and status post left foot fifth ray amputation.  Patient has had a chronic ulcer beneath the fourth metatarsal head he has developed cellulitis ulceration and further drainage and was brought to the hospital for evaluation and treatment.  Past Medical History:  Diagnosis Date  . Anxiety   . CAD (coronary artery disease)    sees Dr. [name], normal Stress test 07-16-12   . Diabetes mellitus   . DJD (degenerative joint disease)   . Gout   . Hyperlipidemia   . Hypertension   . Hypothyroid   . Osteoarthritis   . Renal calculus    hx  . Umbilical hernia    Past Surgical History:  Procedure Laterality Date  . AMPUTATION  07/17/2012   Procedure: AMPUTATION RAY;  Surgeon: [name], MD;  Location: [facility] OR;  Service: Orthopedics;  Laterality: Right;  right hallux amputation (1st ray resection )  . AMPUTATION Left 03/06/2019   Procedure: Ray Amputation left foot;  Surgeon: [name], MD;  Location: [facility] ORS;  Service: Orthopedics;  Laterality: Left;  . CORONARY ANGIOPLASTY WITH STENT PLACEMENT    . FINGER SURGERY     left index finger  . I & D EXTREMITY  05/19/2012   Procedure: IRRIGATION AND DEBRIDEMENT EXTREMITY;  Surgeon: [name], MD;  Location: [facility] OR;  Service: Orthopedics;  Laterality: Right;  Right Great toe  . I & D Toe  05/19/2012   rt great toe  . TONSILLECTOMY     Social History   Socioeconomic History  . Marital status: Married    Spouse name: Not on file  . Number of children: Not on file  . Years of education: Not on file  . Highest education level: Not on file  Occupational History  . Not on file  Tobacco Use  . Smoking  status: Never Smoker  . Smokeless tobacco: Current User    Types: Chew  . Tobacco comment: occ  Substance and Sexual Activity  . Alcohol use: Yes    Alcohol/week: 0.0 standard drinks    Comment: weekends  . Drug use: No  . Sexual activity: Yes  Other Topics Concern  . Not on file  Social History Narrative  . Not on file   Social Determinants of Health   Financial Resource Strain: Not on file  Food Insecurity: Not on file  Transportation Needs: Not on file  Physical Activity: Not on file  Stress: Not on file  Social Connections: Not on file   Family History  Problem Relation Age of Onset  . Heart attack Mother   . Arthritis Other   . Coronary artery disease Other   . Diabetes Other   . Hypertension Other   . Prostate cancer Other   . Stroke Other    - negative except otherwise stated in the family history section No Known Allergies Prior to Admission medications   Medication Sig Start Date End Date Taking? Authorizing Provider  ALPRAZolam (XANAX) 1 MG tablet TAKE 1 TABLET BY MOUTH 3 TIMES DAILY AS NEEDED FOR ANXIETY. Patient taking differently: Take 1 mg by mouth 3 (three) times daily. 09/22/20  Yes [name], MD  amLODipine (NORVASC) 10 MG tablet  TAKE 1 TABLET (10 MG TOTAL) BY MOUTH DAILY. <PLEASE MAKE APPOINTMENT FOR REFILLS> 11/21/20  Yes [name], MD  Aspirin-Acetaminophen-Caffeine [phone] MG PACK Take 1 Package by mouth daily as needed (pain). 07/06/15  Yes [provider]  ciprofloxacin (CIPRO) 500 MG tablet Take 500 mg by mouth 2 (two) times daily. Start date : 12/06/20 12/06/20  Yes [provider]  cloNIDine (CATAPRES) 0.3 MG tablet TAKE 1 TABLET (0.3 MG TOTAL) BY MOUTH 2 (TWO) TIMES DAILY. NEED OV. Patient taking differently: Take 0.3 mg by mouth 2 (two) times daily. 11/21/20  Yes [name], MD  colchicine 0.6 MG tablet TAKE 1 TABLET (0.6 MG TOTAL) BY MOUTH EVERY 6 (SIX) HOURS AS NEEDED (GOUT). 02/22/20  Yes [name], MD   ezetimibe (ZETIA) 10 MG tablet TAKE 1 TABLET BY MOUTH EVERY DAY Patient taking differently: Take 10 mg by mouth daily. 11/21/20  Yes [name], MD  fenofibrate 160 MG tablet TAKE 1 TABLET (160 MG TOTAL) BY MOUTH DAILY. NEED OV. Patient taking differently: Take 160 mg by mouth daily. 11/21/20  Yes [name], MD  glipiZIDE (GLUCOTROL) 5 MG tablet glipizide 5 mg tablet  TAKE 1 TABLET (5 MG TOTAL) BY MOUTH 2 (TWO) TIMES DAILY BEFORE A MEAL. Patient taking differently: Take 2.5 mg by mouth 2 (two) times daily before a meal. 10/03/20  Yes [name], MD  HYDROcodone-acetaminophen (NORCO) 10-325 MG tablet Take 1 tablet by mouth every 6 (six) hours as needed for moderate pain. 11/26/20 12/26/20 Yes [name], MD  indomethacin (INDOCIN SR) 75 MG CR capsule TAKE 1 CAPSULE BY MOUTH TWICE A DAY WITH A MEAL Patient taking differently: Take 75 mg by mouth daily as needed for moderate pain. 10/19/20  Yes [name], MD  levothyroxine (SYNTHROID) 112 MCG tablet TAKE 1 TABLET BY MOUTH EVERY DAY Patient taking differently: Take 112 mcg by mouth daily before breakfast. 11/21/20  Yes [name], MD  linezolid (ZYVOX) 600 MG tablet Take 600 mg by mouth 2 (two) times daily. Start date : 12/06/20 12/06/20  Yes [provider]  lisinopril-hydrochlorothiazide (ZESTORETIC) 20-12.5 MG tablet TAKE 1 TABLET BY MOUTH 2 (TWO) TIMES DAILY. 10AM AND 5PM Patient taking differently: Take 1 tablet by mouth 2 (two) times daily. 07/11/20  Yes [name], MD  metFORMIN (GLUCOPHAGE) 1000 MG tablet TAKE 1 TABLET BY MOUTH TWO TIMES A DAY WITH A MEAL Patient taking differently: Take 1,000 mg by mouth 2 (two) times daily with a meal. 08/23/20  Yes [name], MD  metoprolol succinate (TOPROL-XL) 100 MG 24 hr tablet TAKE 1 TABLET BY MOUTH TWICE A DAY Patient taking differently: Take 100 mg by mouth 2 (two) times daily. 07/29/20  Yes [name], MD  nitroGLYCERIN (NITROSTAT) 0.4 MG SL tablet Place 1  tablet (0.4 mg total) under the tongue every 5 (five) minutes as needed. For chest pain. Patient taking differently: Place 0.4 mg under the tongue every 5 (five) minutes as needed for chest pain. 05/21/18  Yes Lewayne Bunting, MD  omeprazole (PRILOSEC) 40 MG capsule Take 1 capsule (40 mg total) by mouth daily. 09/28/20  Yes [name], MD  rosuvastatin (CRESTOR) 40 MG tablet TAKE 1 TABLET (40 MG TOTAL) BY MOUTH DAILY. <PLEASE MAKE APPOINTMENT FOR REFILLS> Patient taking differently: Take 40 mg by mouth daily. 02/01/20  Yes [name], MD  zolpidem (AMBIEN) 10 MG tablet TAKE 1 TABLET BY MOUTH AT BEDTIME  AS NEEDED FOR SLEEP. Patient taking differently: Take 10 mg by mouth at bedtime as needed for sleep. 07/20/20  Yes Nelwyn SalisburyFry, Stephen A, MD  acetaminophen (TYLENOL) 325 MG tablet Take 2 tablets (650 mg total) by mouth every 6 (six) hours as needed for mild pain (or Fever >/= 101). Patient not taking: No sig reported 03/09/19   Margie EgeKyle, [name], DO  fluticasone (FLONASE) 50 MCG/ACT nasal spray Place 2 sprays into both nostrils daily. Patient not taking: No sig reported 08/13/20   Bennie PieriniMartin, Mary-Margaret, FNP  [facility] test strip 1 EACH BY OTHER ROUTE DAILY. USE AS INSTRUCTED 11/01/20   Nelwyn SalisburyFry, Stephen A, MD   DG Foot Complete Left  Result Date: 12/08/2020 CLINICAL DATA:  Left foot infection. EXAM: LEFT FOOT - COMPLETE 3+ VIEW COMPARISON:  October 13, 2020. FINDINGS: Status post amputation of fifth metatarsal. Degenerative changes seen involving the first metatarsophalangeal joint. Dislocation of fourth metatarsophalangeal joint is noted. No acute fracture is noted. No lytic destruction is noted to suggest osteomyelitis. IMPRESSION: Stable postsurgical and degenerative changes as described above. Interval development of dislocation of fourth metatarsophalangeal joint. No definite evidence of osteomyelitis. Electronically Signed   By: Lupita RaiderJames  Green Jr M.D.   On: 12/08/2020 12:18   - pertinent xrays, CT, MRI  studies were reviewed and independently interpreted  Positive ROS: All other systems have been reviewed and were otherwise negative with the exception of those mentioned in the HPI and as above.  Physical Exam: General: Alert, no acute distress Psychiatric: Patient is competent for consent with normal mood and affect Lymphatic: No axillary or cervical lymphadenopathy Cardiovascular: No pedal edema Respiratory: No cyanosis, no use of accessory musculature GI: No organomegaly, abdomen is soft and non-tender    Images:  @ENCIMAGES @  Labs:  Lab Results  Component Value Date   HGBA1C 6.6 (H) 12/08/2020   HGBA1C 7.5 (H) 09/28/2020   HGBA1C 7.6 (A) 10/19/2019   ESRSEDRATE 50 (H) 03/02/2019   ESRSEDRATE 63 (H) 07/17/2012   CRP 2.9 (H) 03/02/2019   CRP 1.3 (H) 07/17/2012   LABURIC 7.2 12/03/2017   LABURIC 7.6 01/11/2017   LABURIC 12.7 (H) 11/05/2014   REPTSTATUS 12/08/2020 FINAL 12/06/2020   GRAMSTAIN  12/06/2020    FEW WBC PRESENT, PREDOMINANTLY PMN FEW GRAM POSITIVE COCCI IN PAIRS IN CLUSTERS    CULT  12/06/2020    FEW STREPTOCOCCUS AGALACTIAE TESTING AGAINST S. AGALACTIAE NOT ROUTINELY PERFORMED DUE TO PREDICTABILITY OF AMP/PEN/VAN SUSCEPTIBILITY. WITHIN MIXED ORGANISMS Performed at [facility], [address] [place], [place] [address]    LABORGA METHICILLIN RESISTANT STAPHYLOCOCCUS AUREUS 10/13/2020   LABORGA ENTEROCOCCUS FAECALIS 10/13/2020    Lab Results  Component Value Date   ALBUMIN 3.0 (L) 12/08/2020   ALBUMIN 3.9 09/28/2020   ALBUMIN 3.5 03/07/2019   LABURIC 7.2 12/03/2017   LABURIC 7.6 01/11/2017   LABURIC 12.7 (H) 11/05/2014    No results found for: CBC  Neurologic: Patient does not have protective sensation bilateral lower extremities.   MUSCULOSKELETAL:   Skin: Examination there is cellulitis and necrotic skin dorsally over the MTP joint fourth toe left foot.  Patient's blood sugars are under good control hemoglobin A1c 6.6  Patient  has a strong dorsalis pedis pulse bilaterally.  On the plantar aspect the left foot patient has an ulcer that probes to bone beneath the fourth metatarsal head consistent with osteomyelitis.  Review the radiographs show no destructive bony changes.  His MRI scan from 2 months ago was reviewed which showed  no deep abscess no osteomyelitis.  Patient canceled his MRI scan during this admission due to the previous MRI scan being inconclusive.  Assessment: Assessment: Osteomyelitis left foot fourth metatarsal head status post fifth ray amputation with diabetic insensate neuropathy.  Plan: Plan: Discussed treatment options.  I feel the patient's best option is to proceed with a transmetatarsal amputation.  Risk and benefits were discussed including the importance of nonweightbearing for 3 weeks.  Patient states he understands wished to proceed with surgery we will plan for surgery tomorrow, Saturday morning.  Thank you for the consult and the opportunity to see Mr. Ellie Spickler, MD [facility] [phone] 8:18 AM

## 2020-12-10 ENCOUNTER — Encounter (HOSPITAL_COMMUNITY)
Admission: EM | Disposition: A | Discharge status: Home Health | Payer: Self-pay | Source: Home / Self Care | Attending: Internal Medicine

## 2020-12-10 ENCOUNTER — Inpatient Hospital Stay (HOSPITAL_COMMUNITY): Payer: 59 | Admitting: Anesthesiology

## 2020-12-10 DIAGNOSIS — E11628 Type 2 diabetes mellitus with other skin complications: Secondary | ICD-10-CM | POA: Diagnosis not present

## 2020-12-10 DIAGNOSIS — L03116 Cellulitis of left lower limb: Secondary | ICD-10-CM | POA: Diagnosis not present

## 2020-12-10 DIAGNOSIS — L089 Local infection of the skin and subcutaneous tissue, unspecified: Secondary | ICD-10-CM | POA: Diagnosis not present

## 2020-12-10 DIAGNOSIS — M869 Osteomyelitis, unspecified: Secondary | ICD-10-CM | POA: Diagnosis not present

## 2020-12-10 HISTORY — PX: TRANSMETATARSAL AMPUTATION: SHX6197

## 2020-12-10 LAB — BASIC METABOLIC PANEL
Anion gap: 8 (ref 5–15)
BUN: 17 mg/dL (ref 6–20)
CO2: 24 mmol/L (ref 22–32)
Calcium: 9 mg/dL (ref 8.9–10.3)
Chloride: 108 mmol/L (ref 98–111)
Creatinine, Ser: 1.2 mg/dL (ref 0.61–1.24)
GFR, Estimated: >60 mL/min (ref >60)
Glucose, Bld: 143 mg/dL — ABNORMAL HIGH (ref 70–99)
Potassium: 4.2 mmol/L (ref 3.5–5.1)
Sodium: 140 mmol/L (ref 135–145)

## 2020-12-10 LAB — GLUCOSE, CAPILLARY
Glucose-Capillary: 108 mg/dL — ABNORMAL HIGH (ref 70–99)
Glucose-Capillary: 163 mg/dL — ABNORMAL HIGH (ref 70–99)
Glucose-Capillary: 281 mg/dL — ABNORMAL HIGH (ref 70–99)
Glucose-Capillary: 304 mg/dL — ABNORMAL HIGH (ref 70–99)
Glucose-Capillary: 191 mg/dL — ABNORMAL HIGH (ref 70–99)

## 2020-12-10 LAB — CBC
HCT: 31.1 % — ABNORMAL LOW (ref 39.0–52.0)
Hemoglobin: 9.7 g/dL — ABNORMAL LOW (ref 13.0–17.0)
MCH: 29.7 pg (ref 26.0–34.0)
MCHC: 31.2 g/dL (ref 30.0–36.0)
MCV: 95.1 fL (ref 80.0–100.0)
Platelets: 299 10*3/uL (ref 150–400)
RBC: 3.27 MIL/uL — ABNORMAL LOW (ref 4.22–5.81)
RDW: 14.8 % (ref 11.5–15.5)
WBC: 4.6 10*3/uL (ref 4.0–10.5)
nRBC: 0 % (ref 0.0–0.2)

## 2020-12-10 LAB — MAGNESIUM: Magnesium: 1.5 mg/dL — ABNORMAL LOW (ref 1.7–2.4)

## 2020-12-10 SURGERY — AMPUTATION, FOOT, TRANSMETATARSAL
Anesthesia: Regional | Site: Foot | Laterality: Left

## 2020-12-10 MED ORDER — OXYCODONE HCL 5 MG PO TABS
5.0000 mg | ORAL_TABLET | ORAL | Status: DC | PRN
  Administered 2020-12-10 (×2): 10 mg via ORAL
  Filled 2020-12-10 (×3): qty 2

## 2020-12-10 MED ORDER — PROPOFOL 500 MG/50ML IV EMUL
INTRAVENOUS | Status: DC | PRN
  Administered 2020-12-10: 100 ug/kg/min via INTRAVENOUS

## 2020-12-10 MED ORDER — ONDANSETRON HCL 4 MG/2ML IJ SOLN
INTRAMUSCULAR | Status: DC | PRN
  Administered 2020-12-10: 4 mg via INTRAVENOUS

## 2020-12-10 MED ORDER — FENTANYL CITRATE (PF) 250 MCG/5ML IJ SOLN
INTRAMUSCULAR | Status: AC
  Filled 2020-12-10: qty 5

## 2020-12-10 MED ORDER — BUPIVACAINE-EPINEPHRINE (PF) 0.5% -1:200000 IJ SOLN
INTRAMUSCULAR | Status: DC | PRN
  Administered 2020-12-10: 30 mL via PERINEURAL

## 2020-12-10 MED ORDER — LACTATED RINGERS IV SOLN: INTRAVENOUS | Status: DC | PRN

## 2020-12-10 MED ORDER — ONDANSETRON HCL 4 MG/2ML IJ SOLN: 4.0000 mg | Freq: Four times a day (QID) | INTRAMUSCULAR | Status: DC | PRN

## 2020-12-10 MED ORDER — CHLORHEXIDINE GLUCONATE 0.12 % MT SOLN: 15.0000 mL | Freq: Once | OROMUCOSAL | Status: AC

## 2020-12-10 MED ORDER — MIDAZOLAM HCL 2 MG/2ML IJ SOLN
INTRAMUSCULAR | Status: AC
  Filled 2020-12-10: qty 2

## 2020-12-10 MED ORDER — HYDROMORPHONE HCL 1 MG/ML IJ SOLN
INTRAMUSCULAR | Status: AC
  Filled 2020-12-10: qty 1

## 2020-12-10 MED ORDER — FENTANYL CITRATE (PF) 100 MCG/2ML IJ SOLN
INTRAMUSCULAR | Status: AC
  Filled 2020-12-10: qty 2

## 2020-12-10 MED ORDER — HYDRALAZINE HCL 20 MG/ML IJ SOLN: 5.0000 mg | INTRAMUSCULAR | Status: DC | PRN

## 2020-12-10 MED ORDER — BISACODYL 5 MG PO TBEC: 5.0000 mg | DELAYED_RELEASE_TABLET | Freq: Every day | ORAL | Status: DC | PRN

## 2020-12-10 MED ORDER — FENTANYL CITRATE (PF) 250 MCG/5ML IJ SOLN
INTRAMUSCULAR | Status: DC | PRN
  Administered 2020-12-10: 50 ug via INTRAVENOUS

## 2020-12-10 MED ORDER — ZINC SULFATE 220 (50 ZN) MG PO CAPS
220.0000 mg | ORAL_CAPSULE | Freq: Every day | ORAL | Status: DC
  Administered 2020-12-10 – 2020-12-11 (×2): 220 mg via ORAL
  Filled 2020-12-10 (×2): qty 1

## 2020-12-10 MED ORDER — 0.9 % SODIUM CHLORIDE (POUR BTL) OPTIME
TOPICAL | Status: DC | PRN
  Administered 2020-12-10: 1000 mL

## 2020-12-10 MED ORDER — GUAIFENESIN-DM 100-10 MG/5ML PO SYRP: 15.0000 mL | ORAL_SOLUTION | ORAL | Status: DC | PRN

## 2020-12-10 MED ORDER — POTASSIUM CHLORIDE CRYS ER 20 MEQ PO TBCR: 20.0000 meq | EXTENDED_RELEASE_TABLET | Freq: Every day | ORAL | Status: DC | PRN

## 2020-12-10 MED ORDER — HYDROMORPHONE HCL 1 MG/ML IJ SOLN: 0.2500 mg | INTRAMUSCULAR | Status: DC | PRN

## 2020-12-10 MED ORDER — MIDAZOLAM HCL 2 MG/2ML IJ SOLN
INTRAMUSCULAR | Status: DC | PRN
  Administered 2020-12-10: 2 mg via INTRAVENOUS

## 2020-12-10 MED ORDER — PANTOPRAZOLE SODIUM 40 MG PO TBEC: 40.0000 mg | DELAYED_RELEASE_TABLET | Freq: Every day | ORAL | Status: DC

## 2020-12-10 MED ORDER — LABETALOL HCL 5 MG/ML IV SOLN: 10.0000 mg | INTRAVENOUS | Status: DC | PRN

## 2020-12-10 MED ORDER — POLYETHYLENE GLYCOL 3350 17 G PO PACK: 17.0000 g | PACK | Freq: Every day | ORAL | Status: DC | PRN

## 2020-12-10 MED ORDER — PHENOL 1.4 % MT LIQD: 1.0000 | OROMUCOSAL | Status: DC | PRN

## 2020-12-10 MED ORDER — ALUM & MAG HYDROXIDE-SIMETH 200-200-20 MG/5ML PO SUSP
15.0000 mL | ORAL | Status: DC | PRN
Start: 2020-12-10 — End: 2020-12-11

## 2020-12-10 MED ORDER — ACETAMINOPHEN 325 MG PO TABS: 325.0000 mg | ORAL_TABLET | Freq: Four times a day (QID) | ORAL | Status: DC | PRN

## 2020-12-10 MED ORDER — CHLORHEXIDINE GLUCONATE 0.12 % MT SOLN
OROMUCOSAL | Status: AC
  Administered 2020-12-10: 15 mL via OROMUCOSAL
  Filled 2020-12-10: qty 15

## 2020-12-10 MED ORDER — OXYCODONE HCL 5 MG PO TABS
10.0000 mg | ORAL_TABLET | ORAL | Status: DC | PRN
  Administered 2020-12-10 – 2020-12-11 (×3): 10 mg via ORAL
  Filled 2020-12-10 (×2): qty 2

## 2020-12-10 MED ORDER — MAGNESIUM CITRATE PO SOLN: 1.0000 | Freq: Once | ORAL | Status: DC | PRN

## 2020-12-10 MED ORDER — LACTATED RINGERS IV SOLN: INTRAVENOUS | Status: DC

## 2020-12-10 MED ORDER — HYDROMORPHONE HCL 1 MG/ML IJ SOLN: 0.5000 mg | INTRAMUSCULAR | Status: DC | PRN

## 2020-12-10 MED ORDER — ASCORBIC ACID 500 MG PO TABS
1000.0000 mg | ORAL_TABLET | Freq: Every day | ORAL | Status: DC
  Administered 2020-12-10 – 2020-12-11 (×2): 1000 mg via ORAL
  Filled 2020-12-10 (×2): qty 2

## 2020-12-10 MED ORDER — MAGNESIUM SULFATE 2 GM/50ML IV SOLN: 2.0000 g | Freq: Every day | INTRAVENOUS | Status: DC | PRN

## 2020-12-10 MED ORDER — DOCUSATE SODIUM 100 MG PO CAPS
100.0000 mg | ORAL_CAPSULE | Freq: Every day | ORAL | Status: DC
  Filled 2020-12-10: qty 1

## 2020-12-10 MED ORDER — PROPOFOL 10 MG/ML IV BOLUS
INTRAVENOUS | Status: DC | PRN
  Administered 2020-12-10: 40 mg via INTRAVENOUS
  Administered 2020-12-10: 150 mg via INTRAVENOUS

## 2020-12-10 MED ORDER — METOPROLOL TARTRATE 5 MG/5ML IV SOLN: 2.0000 mg | INTRAVENOUS | Status: DC | PRN

## 2020-12-10 MED ORDER — CEFAZOLIN SODIUM-DEXTROSE 2-3 GM-%(50ML) IV SOLR
INTRAVENOUS | Status: DC | PRN
  Administered 2020-12-10: 2 g via INTRAVENOUS

## 2020-12-10 MED ORDER — PROSOURCE PLUS PO LIQD
30.0000 mL | Freq: Two times a day (BID) | ORAL | Status: DC
  Administered 2020-12-10 (×2): 30 mL via ORAL

## 2020-12-10 MED ORDER — SODIUM CHLORIDE 0.9 % IV SOLN: INTRAVENOUS | Status: DC

## 2020-12-10 SURGICAL SUPPLY — 34 items
BLADE SAW SGTL MED 73X18.5 STR (BLADE) ×1 IMPLANT
BLADE SURG 21 STRL SS (BLADE) ×2 IMPLANT
BNDG COHESIVE 4X5 TAN STRL (GAUZE/BANDAGES/DRESSINGS) ×2 IMPLANT
BNDG GAUZE ELAST 4 BULKY (GAUZE/BANDAGES/DRESSINGS) ×1 IMPLANT
CANISTER WOUNDNEG PRESSURE 500 (CANNISTER) ×1 IMPLANT
COVER SURGICAL LIGHT HANDLE (MISCELLANEOUS) ×3 IMPLANT
COVER WAND RF STERILE (DRAPES) ×2 IMPLANT
DRAPE DERMATAC (DRAPES) ×1 IMPLANT
DRAPE U-SHAPE 47X51 STRL (DRAPES) ×3 IMPLANT
DRESSING PEEL AND PLC PRVNA 13 (GAUZE/BANDAGES/DRESSINGS) IMPLANT
DRSG ADAPTIC 3X8 NADH LF (GAUZE/BANDAGES/DRESSINGS) ×1 IMPLANT
DRSG DERMACEA 8X12 NADH (GAUZE/BANDAGES/DRESSINGS) ×1 IMPLANT
DRSG PAD ABDOMINAL 8X10 ST (GAUZE/BANDAGES/DRESSINGS) ×2 IMPLANT
DRSG PEEL AND PLACE PREVENA 13 (GAUZE/BANDAGES/DRESSINGS) ×2
DURAPREP 26ML APPLICATOR (WOUND CARE) ×2 IMPLANT
ELECT REM PT RETURN 9FT ADLT (ELECTROSURGICAL) ×2
ELECTRODE REM PT RTRN 9FT ADLT (ELECTROSURGICAL) ×1 IMPLANT
GAUZE SPONGE 4X4 12PLY STRL (GAUZE/BANDAGES/DRESSINGS) ×1 IMPLANT
GLOVE BIOGEL PI IND STRL 9 (GLOVE) ×1 IMPLANT
GLOVE BIOGEL PI INDICATOR 9 (GLOVE) ×1
GLOVE SURG ORTHO 9.0 STRL STRW (GLOVE) ×2 IMPLANT
GOWN STRL REUS W/ TWL XL LVL3 (GOWN DISPOSABLE) ×2 IMPLANT
GOWN STRL REUS W/TWL XL LVL3 (GOWN DISPOSABLE) ×2
KIT BASIN OR (CUSTOM PROCEDURE TRAY) ×2 IMPLANT
KIT DRSG PREVENA PLUS 7DAY 125 (MISCELLANEOUS) ×1 IMPLANT
KIT TURNOVER KIT B (KITS) ×2 IMPLANT
NS IRRIG 1000ML POUR BTL (IV SOLUTION) ×2 IMPLANT
PACK ORTHO EXTREMITY (CUSTOM PROCEDURE TRAY) ×2 IMPLANT
PAD ARMBOARD 7.5X6 YLW CONV (MISCELLANEOUS) ×4 IMPLANT
STOCKINETTE IMPERVIOUS LG (DRAPES) IMPLANT
SUT ETHILON 2 0 PSLX (SUTURE) ×2 IMPLANT
TOWEL GREEN STERILE (TOWEL DISPOSABLE) ×2 IMPLANT
TUBE CONNECTING 12X1/4 (SUCTIONS) ×2 IMPLANT
YANKAUER SUCT BULB TIP NO VENT (SUCTIONS) ×2 IMPLANT

## 2020-12-10 NOTE — OR Nursing (Signed)
Prevena Plus dressing kit sent to PACU with patient.

## 2020-12-10 NOTE — Interval H&P Note (Signed)
History and Physical Interval Note:  12/10/2020 7:33 AM  Robert Huang  has presented today for surgery, with the diagnosis of OSTEOMYELITIS LEFT FOOT.  The various methods of treatment have been discussed with the patient and family. After consideration of risks, benefits and other options for treatment, the patient has consented to  Procedure(s): TRANSMETATARSAL AMPUTATION (Left) as a surgical intervention.  The patient's history has been reviewed, patient examined, no change in status, stable for surgery.  I have reviewed the patient's chart and labs.  Questions were answered to the patient's satisfaction.     [name]

## 2020-12-10 NOTE — Anesthesia Procedure Notes (Signed)
Anesthesia Regional Block: Popliteal block   Pre-Anesthetic Checklist: ,, timeout performed, Correct Patient, Correct Site, Correct Laterality, Correct Procedure, Correct Position, site marked, Risks and benefits discussed, pre-op evaluation,  At surgeon's request and post-op pain management  Laterality: Left  Prep: Maximum Sterile Barrier Precautions used, chloraprep       Needles:  Injection technique: Single-shot  Needle Type: Echogenic Stimulator Needle     Needle Length: 9cm  Needle Gauge: 21     Additional Needles:   Procedures:,,,, ultrasound used (permanent image in chart),,,,  Narrative:  Start time: 12/10/2020 7:47 AM End time: 12/10/2020 7:57 AM Injection made incrementally with aspirations every 5 mL. Anesthesiologist: Gaynelle Adu, MD  Additional Notes: 2% Lidocaine skin wheel.

## 2020-12-10 NOTE — Anesthesia Postprocedure Evaluation (Signed)
Anesthesia Post Note  Patient: Robert Huang [id number]  Procedure(s) Performed: TRANSMETATARSAL AMPUTATION LEFT (Left Foot)     Patient location during evaluation: PACU Anesthesia Type: Regional and General Level of consciousness: awake and alert Pain management: pain level controlled Vital Signs Assessment: post-procedure vital signs reviewed and stable Respiratory status: spontaneous breathing, nonlabored ventilation and respiratory function stable Cardiovascular status: blood pressure returned to baseline and stable Postop Assessment: no apparent nausea or vomiting Anesthetic complications: no   No complications documented.  Last Vitals:  Vitals:   12/10/20 0910 12/10/20 0915  BP: (!) 158/70 (!) 154/67  Pulse: (!) 56 (!) 50  Resp: 18 13  Temp:  (!) 36.4 C  SpO2: 100% 100%    Last Pain:  Vitals:   12/10/20 0915  TempSrc:   PainSc: 0-No pain                 Robert Huang,W. EDMOND

## 2020-12-10 NOTE — Evaluation (Signed)
Physical Therapy Evaluation Patient Details Name: Robert Huang MRN: [id number] DOB: [birth year] Today's Date: 12/10/2020   History of Present Illness  60 y.o. male who presents to ED 12/08/20 with c/o increased redness and pain associated with with chronic ulcer fourth metatarsal head left foot. In ED found to have developed cellulitis surrounding ulcer on dorsal aspect of L foot. Dr Lajoyce Corners consulted and pt s/p 12/10/20 L transmetatarsal amputation. PMH significant for DM 2, HTN, CAD, hypothyroidism and peripheral vascular disease with multiple episodes of diabetic foot infection and osteomyelitis resulting in amputation of left fifth ray  and amputation right great toe in July 2020  Clinical Impression  PTA pt living with wife in multistory home with bed and bath upstair, full bath and couch down stairs. Pt was independent with mobility, bADLs, iADLs, working and driving. Pt is currently limited in safe mobility by NWB (TDWB through heel if unavoidable), and related decreased balance and mobility. Pt is supervision for bed mobility and min guard for transfers and short distance ambulation.Pt will need stair training before going home.  PT recommending possible OP PT after weightbearing lifted to work on gait abnormalities.     Follow Up Recommendations No PT follow up;Other (comment);Supervision for mobility/OOB (recommend PT for gait training if need after weightbearing restrictions lifted)    Equipment Recommendations  None recommended by PT (has necessary equipment)       Precautions / Restrictions Precautions Precautions: Fall Precaution Comments: Wound Vac Restrictions Weight Bearing Restrictions: Yes LLE Weight Bearing: Touchdown weight bearing (heel only) Other Position/Activity Restrictions: Touchdown weightbearing on the heel is acceptable however nonweightbearing is ideal on the left lower extremity      Mobility  Bed Mobility Overal bed mobility: Needs Assistance Bed  Mobility: Supine to Sit     Supine to sit: Supervision;HOB elevated     General bed mobility comments: supervision for safety,    Transfers Overall transfer level: Needs assistance Equipment used: Right platform walker Transfers: Sit to/from Stand Sit to Stand: Min guard         General transfer comment: min guard for safety, vc for L LE extention to keep NWB with power up and steadying  Ambulation/Gait Ambulation/Gait assistance: Min guard Gait Distance (Feet): 3 Feet (3 feet forward, 2 feet back to chair) Assistive device: Rolling walker (2 wheeled) Gait Pattern/deviations: Step-to pattern Gait velocity: slowed Gait velocity interpretation: <1.31 ft/sec, indicative of household ambulator General Gait Details: min guard for safety, demonstration of increased tricep usage with RW handles at hips to advance R LE      Balance Overall balance assessment: Needs assistance Sitting-balance support: Feet supported;Feet unsupported;No upper extremity supported Sitting balance-Leahy Scale: Good     Standing balance support: During functional activity;Bilateral upper extremity supported Standing balance-Leahy Scale: Poor Standing balance comment: requires B UE support for maintaining R LE single leg stance                             Pertinent Vitals/Pain Pain Assessment: 0-10 Pain Score: 2  Pain Location: residual limb Pain Descriptors / Indicators: Throbbing Pain Intervention(s): Limited activity within patient's tolerance;Monitored during session;Repositioned    Home Living Family/patient expects to be discharged to:: Private residence Living Arrangements: Spouse/significant other Available Help at Discharge: Family;Available 24 hours/day Type of Home: House Home Access: Stairs to enter Entrance Stairs-Rails: Can reach both Entrance Stairs-Number of Steps: 3 Home Layout: Two level;Full bath on main level;Other (Comment) (couch) Home  Equipment: Dan Humphreys - 2  wheels;Crutches;Bedside commode      Prior Function Level of Independence: Independent                  Extremity/Trunk Assessment   Upper Extremity Assessment Upper Extremity Assessment: Overall WFL for tasks assessed    Lower Extremity Assessment Lower Extremity Assessment: LLE deficits/detail LLE Deficits / Details: L transmetatarsal amputation LLE Sensation: decreased light touch       Communication   Communication: No difficulties  Cognition Arousal/Alertness: Awake/alert Behavior During Therapy: WFL for tasks assessed/performed                                          General Comments General comments (skin integrity, edema, etc.): VSS on RA        Assessment/Plan    PT Assessment Patient needs continued PT services  PT Problem List Decreased balance;Decreased activity tolerance;Decreased mobility       PT Treatment Interventions DME instruction;Gait training;Stair training;Functional mobility training;Therapeutic activities;Therapeutic exercise;Balance training;Cognitive remediation;Patient/family education    PT Goals (Current goals can be found in the Care Plan section)  Acute Rehab PT Goals Patient Stated Goal: go home tomorrow for his birthday PT Goal Formulation: With patient/family Time For Goal Achievement: 12/24/20 Potential to Achieve Goals: Good    Frequency Min 3X/week    AM-PAC PT "6 Clicks" Mobility  Outcome Measure Help needed turning from your back to your side while in a flat bed without using bedrails?: None Help needed moving from lying on your back to sitting on the side of a flat bed without using bedrails?: None Help needed moving to and from a bed to a chair (including a wheelchair)?: None Help needed standing up from a chair using your arms (e.g., wheelchair or bedside chair)?: None Help needed to walk in hospital room?: None Help needed climbing 3-5 steps with a railing? : A Little 6 Click Score: 23     End of Session Equipment Utilized During Treatment: Gait belt Activity Tolerance: Patient tolerated treatment well Patient left: in chair;with call bell/phone within reach;with family/visitor present Nurse Communication: Mobility status PT Visit Diagnosis: Unsteadiness on feet (R26.81);Other abnormalities of gait and mobility (R26.89);Muscle weakness (generalized) (M62.81);Difficulty in walking, not elsewhere classified (R26.2);Pain Pain - Right/Left: Left Pain - part of body: Ankle and joints of foot    Time: 4562-5638 PT Time Calculation (min) (ACUTE ONLY): 36 min   Charges:   PT Evaluation $PT Eval Moderate Complexity: 1 Mod PT Treatments $Gait Training: 8-22 mins        Frieda Arnall B. Beverely Risen PT, DPT Acute Rehabilitation Services Pager [phone] Office [phone]   Elon Alas Fleet 12/10/2020, 4:01 PM

## 2020-12-10 NOTE — Progress Notes (Addendum)
PROGRESS NOTE        PATIENT DETAILS Name: Robert Huang [id number] Age: 60 y.o. Sex: male Date of Birth: [birth year] Admit Date: 12/08/2020 Admitting Physician Vashti Hey, MD PCP:Fry, Ishmael Holter, MD  Brief Narrative: Patient is a 60 y.o. male with history of CAD, DM-2, HTN, hypothyroidism, HLD-chronic left foot ulcer (beneath fourth metatarsal head)-presenting with diabetic foot infection with acute osteomyelitis.   Significant events: 4/15>> admit to [facility]-diabetic foot infection/acute osteo of left foot metatarsal head 4/16>> left transmetatarsal amputation  Significant studies: 4/14>> x-ray left foot: dislocation of fourth metatarsal joint-no evidence of osteo  Antimicrobial therapy: Vancomycin: 4/14>> Cefepime: 4/14>> Flagyl: 4/14>>  Microbiology data: 4/14>> Covid PCR: Negative  Procedures : 4/16>> left transmetatarsal amputation  Consults: Orthopedics  DVT Prophylaxis : SCD's Start: 12/10/20 0928 heparin injection 5,000 Units Start: 12/08/20 2200   Subjective: No major issues-just came back from the OR-s/p left transmetatarsal amputation-claims pain is well controlled.  Assessment/Plan: Left diabetic foot with osteomyelitis of left fourth metatarsal head (probe test positive): Underwent left transmetatarsal amputation on 4/16-continue empiric IV antibiotics till 4/17.  Await further recommendations from orthopedics.  Normocytic anemia: Due to chronic left foot wound-no evidence of blood loss.  FOBT +ve: No overt bleeding-we will require outpatient GI evaluation.  Vitamin B12 deficiency: Continue supplementation.  Hypokalemia: Repleted.  HTN: BP controlled-continue amlodipine, clonidine, lisinopril, HCTZ-follow and adjust  HLD: Continue statin, Zetia and fenofibrate  CAD: No anginal symptoms-Per patient-he is easily able to walk around and climb a couple of flight of stairs without any symptoms.  Hypothyroidism: Continue  with levothyroxine  Gout: On colchicine-no flare  DM-2 (A1c 6.6 on 4/14): CBG stable-continue SSI  Recent Labs    12/09/20 2143 12/10/20 0741 12/10/20 0859  GLUCAP 107* 108* 163*   Prior history of left fifth ray amputation and right great toe amputation  Generalized anxiety disorder: Continue as needed Xanax  Diet: Diet Order            Diet Carb Modified Fluid consistency: Thin; Room service appropriate? Yes  Diet effective now                  Code Status: Full code   Family Communication: None at bedside  Disposition Plan: Status is: Inpatient  Remains inpatient appropriate because:Inpatient level of care appropriate due to severity of illness   Dispo: The patient is from: Home              Anticipated d/c is to: Home              Patient currently is not medically stable to d/c.   Difficult to place patient No   Barriers to Discharge: Left diabetic foot infection with osteo-requiring transmetatarsal amputation-remains on IV antibiotics until 4/17-await mobilization and see how he does.  Antimicrobial agents: Anti-infectives (From admission, onward)   Start     Dose/Rate Route Frequency Ordered Stop   12/09/20 1530  vancomycin (VANCOREADY) IVPB 2000 mg/400 mL  Status:  Discontinued        2,000 mg 200 mL/hr over 120 Minutes Intravenous Every 24 hours 12/08/20 1511 12/08/20 1539   12/09/20 1130  ceFAZolin (ANCEF) IVPB 2g/100 mL premix  Status:  Discontinued        2 g 200 mL/hr over 30 Minutes Intravenous On call to O.R. 12/09/20 1031 12/10/20 0559  12/08/20 2300  piperacillin-tazobactam (ZOSYN) IVPB 3.375 g  Status:  Discontinued       "Followed by" Linked Group Details   3.375 g 12.5 mL/hr over 240 Minutes Intravenous Every 8 hours 12/08/20 1452 12/08/20 1503   12/08/20 1600  vancomycin (VANCOREADY) IVPB 2000 mg/400 mL        2,000 mg 200 mL/hr over 120 Minutes Intravenous Every 24 hours 12/08/20 1539     12/08/20 1530  metroNIDAZOLE (FLAGYL) IVPB  500 mg        500 mg 100 mL/hr over 60 Minutes Intravenous Every 8 hours 12/08/20 1448     12/08/20 1530  ceFEPIme (MAXIPIME) 2 g in sodium chloride 0.9 % 100 mL IVPB        2 g 200 mL/hr over 30 Minutes Intravenous Every 8 hours 12/08/20 1507     12/08/20 1500  piperacillin-tazobactam (ZOSYN) IVPB 3.375 g  Status:  Discontinued       "Followed by" Linked Group Details   3.375 g 100 mL/hr over 30 Minutes Intravenous  Once 12/08/20 1452 12/08/20 1503   12/08/20 1500  vancomycin (VANCOREADY) IVPB 2000 mg/400 mL  Status:  Discontinued        2,000 mg 200 mL/hr over 120 Minutes Intravenous  Once 12/08/20 1455 12/08/20 1511       Time spent: 25- minutes-Greater than 50% of this time was spent in counseling, explanation of diagnosis, planning of further management, and coordination of care.  MEDICATIONS: Scheduled Meds: . (feeding supplement) PROSource Plus  30 mL Oral BID BM  . amLODipine  10 mg Oral Daily  . vitamin C  1,000 mg Oral Daily  . cloNIDine  0.3 mg Oral BID  . cyanocobalamin  1,000 mcg Subcutaneous Q2000  . [START ON 12/11/2020] docusate sodium  100 mg Oral Daily  . ezetimibe  10 mg Oral Daily  . fenofibrate  160 mg Oral Daily  . heparin  5,000 Units Subcutaneous Q8H  . lisinopril  20 mg Oral BID   And  . hydrochlorothiazide  12.5 mg Oral BID  . insulin aspart  0-15 Units Subcutaneous TID WC  . levothyroxine  112 mcg Oral QAC breakfast  . metoprolol succinate  100 mg Oral BID  . mupirocin ointment  1 application Nasal BID  . pantoprazole  40 mg Oral Daily  . rosuvastatin  40 mg Oral Daily  . zinc sulfate  220 mg Oral Daily   Continuous Infusions: . sodium chloride 75 mL/hr at 12/10/20 1112  . ceFEPime (MAXIPIME) IV 2 g (12/10/20 1007)  . magnesium sulfate bolus IVPB    . metronidazole 500 mg (12/10/20 1006)  . vancomycin 200 mL/hr at 12/09/20 1840   PRN Meds:.[START ON 12/11/2020] acetaminophen, ALPRAZolam, alum & mag hydroxide-simeth, bisacodyl, colchicine,  guaiFENesin-dextromethorphan, hydrALAZINE, HYDROcodone-acetaminophen, HYDROmorphone (DILAUDID) injection, labetalol, magnesium citrate, magnesium sulfate bolus IVPB, metoprolol tartrate, ondansetron, oxyCODONE, oxyCODONE, phenol, polyethylene glycol, potassium chloride, zolpidem   PHYSICAL EXAM: Vital signs: Vitals:   12/10/20 0910 12/10/20 0915 12/10/20 0948 12/10/20 0959  BP: (!) 158/70 (!) 154/67 (!) 155/73   Pulse: (!) 56 (!) 50 (!) 56 73  Resp: 18 13 16    Temp:  (!) 97.5 F (36.4 C) 97.8 F (36.6 C)   TempSrc:   Oral   SpO2: 100% 100% 100%   Weight:      Height:       Filed Weights   12/08/20 1400 12/10/20 0416  Weight: 93 kg 92.8 kg   Body mass index is  27.75 kg/m.   Gen Exam:Alert awake-not in any distress HEENT:atraumatic, normocephalic Chest: B/L clear to auscultation anteriorly CVS:S1S2 regular Abdomen:soft non tender, non distended Extremities:no edema-left transmetatarsal amputation-bandaged-did not open. Neurology: Non focal Skin: no rash  I have personally reviewed following labs and imaging studies  LABORATORY DATA: CBC: Recent Labs  Lab 12/08/20 1149 12/08/20 1805 12/09/20 0111 12/10/20 0043  WBC 7.2 6.1 5.3 4.6  NEUTROABS 5.5  --   --   --   HGB 10.5* 10.3* 9.3* 9.7*  HCT 33.4* 32.9* 29.3* 31.1*  MCV 94.4 96.2 94.5 95.1  PLT 321 347 283 403    Basic Metabolic Panel: Recent Labs  Lab 12/08/20 1149 12/08/20 1805 12/09/20 0111 12/10/20 0043  NA 138  --  134* 140  K 4.0  --  3.1* 4.2  CL 107  --  109 108  CO2 20*  --  20* 24  GLUCOSE 131*  --  136* 143*  BUN 14  --  18 17  CREATININE 1.27* 1.29* 1.16 1.20  CALCIUM 8.9  --  8.4* 9.0  MG  --   --   --  1.5*    GFR: Estimated Creatinine Clearance: 71.9 mL/min (by C-G formula based on SCr of 1.2 mg/dL).  Liver Function Tests: Recent Labs  Lab 12/08/20 1149  AST 16  ALT 14  ALKPHOS 56  BILITOT 0.5  PROT 7.1  ALBUMIN 3.0*   No results for input(s): LIPASE, AMYLASE in the last  168 hours. No results for input(s): AMMONIA in the last 168 hours.  Coagulation Profile: No results for input(s): INR, PROTIME in the last 168 hours.  Cardiac Enzymes: No results for input(s): CKTOTAL, CKMB, CKMBINDEX, TROPONINI in the last 168 hours.  BNP (last 3 results) No results for input(s): PROBNP in the last 8760 hours.  Lipid Profile: No results for input(s): CHOL, HDL, LDLCALC, TRIG, CHOLHDL, LDLDIRECT in the last 72 hours.  Thyroid Function Tests: No results for input(s): TSH, T4TOTAL, FREET4, T3FREE, THYROIDAB in the last 72 hours.  Anemia Panel: Recent Labs    12/09/20 0111  VITAMINB12 57*  FOLATE 7.9  FERRITIN 56  TIBC 333  IRON 32*    Urine analysis:    Component Value Date/Time   COLORURINE COLORLESS (A) 12/08/2020 2350   APPEARANCEUR CLEAR 12/08/2020 2350   LABSPEC 1.009 12/08/2020 2350   PHURINE 5.0 12/08/2020 2350   GLUCOSEU NEGATIVE 12/08/2020 2350   HGBUR NEGATIVE 12/08/2020 2350   HGBUR negative 04/16/2007 0904   BILIRUBINUR NEGATIVE 12/08/2020 2350   BILIRUBINUR neg 11/13/2019 1527   KETONESUR NEGATIVE 12/08/2020 2350   PROTEINUR NEGATIVE 12/08/2020 2350   UROBILINOGEN 0.2 11/13/2019 1527   UROBILINOGEN negative 04/16/2007 0904   NITRITE NEGATIVE 12/08/2020 2350   LEUKOCYTESUR NEGATIVE 12/08/2020 2350    Sepsis Labs: Lactic Acid, Venous    Component Value Date/Time   LATICACIDVEN 1.4 12/08/2020 1149    MICROBIOLOGY: Recent Results (from the past 240 hour(s))  Aerobic Culture w Gram Stain (superficial specimen)     Status: None   Collection Time: 12/06/20 12:58 PM   Specimen: Wound  Result Value Ref Range Status   Specimen Description   Final    WOUND LEFT 4TH MET HEAD Performed at [facility], [place] [address] [place], [place] [address]    Special Requests   Final    NONE Performed at [facility], [place] [address], [place], [place] [address]    Gram Stain   Final  FEW WBC PRESENT,  PREDOMINANTLY PMN FEW GRAM POSITIVE COCCI IN PAIRS IN CLUSTERS    Culture   Final    FEW STREPTOCOCCUS AGALACTIAE TESTING AGAINST S. AGALACTIAE NOT ROUTINELY PERFORMED DUE TO PREDICTABILITY OF AMP/PEN/VAN SUSCEPTIBILITY. WITHIN MIXED ORGANISMS Performed at [facility], [place] [address] [place], [place] [address]    Report Status 12/08/2020 FINAL  Final  SARS CORONAVIRUS 2 (TAT 6-24 HRS) Nasopharyngeal Nasopharyngeal Swab     Status: None   Collection Time: 12/08/20  3:27 PM   Specimen: Nasopharyngeal Swab  Result Value Ref Range Status   SARS Coronavirus 2 NEGATIVE NEGATIVE Final    Comment: (NOTE) SARS-CoV-2 target nucleic acids are NOT DETECTED.  The SARS-CoV-2 RNA is generally detectable in upper and lower respiratory specimens during the acute phase of infection. Negative results do not preclude SARS-CoV-2 infection, do not rule out co-infections with other pathogens, and should not be used as the sole basis for treatment or other patient management decisions. Negative results must be combined with clinical observations, patient history, and epidemiological information. The expected result is Negative.  Fact Sheet for Patients: SugarRoll.be  Fact Sheet for Healthcare Providers: https://www.woods-mathews.com/  This test is not yet approved or cleared by the [place] FDA and  has been authorized for detection and/or diagnosis of SARS-CoV-2 by FDA under an Emergency Use Authorization (EUA). This EUA will remain  in effect (meaning this test can be used) for the duration of the COVID-19 declaration under Se ction 564(b)(1) of the Act, 21 U.S.C. section 360bbb-3(b)(1), unless the authorization is terminated or revoked sooner.  Performed at [facility], [place] [address] [place], [place] [address]   Surgical PCR screen     Status: None   Collection Time: 12/09/20  8:24 PM   Specimen: Nasal Mucosa; Nasal Swab  Result  Value Ref Range Status   MRSA, PCR NEGATIVE NEGATIVE Final   Staphylococcus aureus NEGATIVE NEGATIVE Final    Comment: (NOTE) The Xpert SA Assay (FDA approved for NASAL specimens in patients 13 years of age and older), is one component of a comprehensive surveillance program. It is not intended to diagnose infection nor to guide or monitor treatment. Performed at [facility], [place] [address] [place], [place] [address]     RADIOLOGY STUDIES/RESULTS: DG Foot Complete Left  Result Date: 12/08/2020 CLINICAL DATA:  Left foot infection. EXAM: LEFT FOOT - COMPLETE 3+ VIEW COMPARISON:  October 13, 2020. FINDINGS: Status post amputation of fifth metatarsal. Degenerative changes seen involving the first metatarsophalangeal joint. Dislocation of fourth metatarsophalangeal joint is noted. No acute fracture is noted. No lytic destruction is noted to suggest osteomyelitis. IMPRESSION: Stable postsurgical and degenerative changes as described above. Interval development of dislocation of fourth metatarsophalangeal joint. No definite evidence of osteomyelitis. Electronically Signed   By: Marijo Conception M.D.   On: 12/08/2020 12:18     LOS: 2 days   Oren Binet, MD  [facility]    To contact the attending provider between 7A-7P or the covering provider during after hours 7P-7A, please log into the web site [email or web address] and access using universal [place] password for that web site. If you do not have the password, please call the hospital operator.  12/10/2020, 11:37 AM

## 2020-12-10 NOTE — Transfer of Care (Signed)
Immediate Anesthesia Transfer of Care Note  Patient: Robert Huang  Procedure(s) Performed: TRANSMETATARSAL AMPUTATION LEFT (Left Foot)  Patient Location: PACU  Anesthesia Type:GA combined with regional for post-op pain  Level of Consciousness: awake, alert  and patient cooperative  Airway & Oxygen Therapy: Patient Spontanous Breathing and Patient connected to face mask oxygen  Post-op Assessment: Report given to RN and Post -op Vital signs reviewed and stable  Post vital signs: Reviewed and stable  Last Vitals:  Vitals Value Taken Time  BP 135/73 12/10/20 0848  Temp    Pulse 54 12/10/20 0849  Resp 20 12/10/20 0849  SpO2 100 % 12/10/20 0849  Vitals shown include unvalidated device data.  Last Pain:  Vitals:   12/10/20 0446  TempSrc: Oral  PainSc:       Patients Stated Pain Goal: 2 (12/09/20 2008)  Complications: No complications documented.

## 2020-12-10 NOTE — Anesthesia Procedure Notes (Signed)
Procedure Name: LMA Insertion Date/Time: 12/10/2020 8:21 AM Performed by: Carlos American, CRNA Pre-anesthesia Checklist: Patient identified, Emergency Drugs available, Suction available and Patient being monitored Patient Re-evaluated:Patient Re-evaluated prior to induction Oxygen Delivery Method: Circle System Utilized Preoxygenation: Pre-oxygenation with 100% oxygen Induction Type: IV induction Ventilation: Mask ventilation without difficulty LMA: LMA inserted LMA Size: 4.0 Number of attempts: 1 Placement Confirmation: positive ETCO2 Tube secured with: Tape Dental Injury: Teeth and Oropharynx as per pre-operative assessment

## 2020-12-10 NOTE — Op Note (Signed)
12/10/2020  8:44 AM  PATIENT:  Robert Huang    PRE-OPERATIVE DIAGNOSIS:  OSTEOMYELITIS LEFT FOOT  POST-OPERATIVE DIAGNOSIS:  Same  PROCEDURE:  TRANSMETATARSAL AMPUTATION LEFT  SURGEON:  [name], MD  PHYSICIAN ASSISTANT:None ANESTHESIA:   General  PREOPERATIVE INDICATIONS:  Robert Huang is a  60 y.o. male with a diagnosis of OSTEOMYELITIS LEFT FOOT who failed conservative measures and elected for surgical management.    The risks benefits and alternatives were discussed with the patient preoperatively including but not limited to the risks of infection, bleeding, nerve injury, cardiopulmonary complications, the need for revision surgery, among others, and the patient was willing to proceed.  OPERATIVE IMPLANTS: Praveena wound VAC 13 cm  @ENCIMAGES @  OPERATIVE FINDINGS: Significant edema at the amputation site with good petechial bleeding no evidence of infection at the resection margins.  OPERATIVE PROCEDURE: Patient brought to the operating room after undergoing a regional anesthetic he then underwent a general anesthetic.  After adequate levels anesthesia were obtained patient's left lower extremity was prepped using DuraPrep draped into a sterile field a timeout was called.  A fishmouth incision was made proximal to the ulcerative tissue this was carried down to bone and an oscillating saw was used to perform a transmetatarsal amputation.  Electrocautery was used for hemostasis the wound was irrigated with normal saline there is no evidence of infection at the resection margins the muscle was healthy and viable with good contractility.  2-0 nylon was used for wound closure and a 13 cm Prevena wound VAC was applied this was overwrapped with Covan this had a good suction fit patient was extubated taken to PACU in stable condition   DISCHARGE PLANNING:  Antibiotic duration: 24 hours antibiotics postoperatively  Weightbearing: Touchdown weightbearing on the heel is  acceptable however nonweightbearing is ideal on the left lower extremity  Pain medication: Opioid pathway  Dressing care/ Wound VAC: Continue wound VAC for 1 week  Ambulatory devices: Walker  Discharge to: Home.  Follow-up: In the office 1 week post operative.

## 2020-12-11 ENCOUNTER — Encounter (HOSPITAL_COMMUNITY): Payer: Self-pay | Admitting: Orthopedic Surgery

## 2020-12-11 LAB — CBC
HCT: 33.1 % — ABNORMAL LOW (ref 39.0–52.0)
Hemoglobin: 10 g/dL — ABNORMAL LOW (ref 13.0–17.0)
MCH: 29.1 pg (ref 26.0–34.0)
MCHC: 30.2 g/dL (ref 30.0–36.0)
MCV: 96.2 fL (ref 80.0–100.0)
Platelets: 333 10*3/uL (ref 150–400)
RBC: 3.44 MIL/uL — ABNORMAL LOW (ref 4.22–5.81)
RDW: 14.7 % (ref 11.5–15.5)
WBC: 7.5 10*3/uL (ref 4.0–10.5)
nRBC: 0 % (ref 0.0–0.2)

## 2020-12-11 LAB — GLUCOSE, CAPILLARY
Glucose-Capillary: 183 mg/dL — ABNORMAL HIGH (ref 70–99)
Glucose-Capillary: 99 mg/dL (ref 70–99)

## 2020-12-11 LAB — BASIC METABOLIC PANEL
Anion gap: 8 (ref 5–15)
BUN: 20 mg/dL (ref 8–23)
CO2: 23 mmol/L (ref 22–32)
Calcium: 8.9 mg/dL (ref 8.9–10.3)
Chloride: 109 mmol/L (ref 98–111)
Creatinine, Ser: 1.13 mg/dL (ref 0.61–1.24)
GFR, Estimated: >60 mL/min (ref >60)
Glucose, Bld: 207 mg/dL — ABNORMAL HIGH (ref 70–99)
Potassium: 3.6 mmol/L (ref 3.5–5.1)
Sodium: 140 mmol/L (ref 135–145)

## 2020-12-11 MED ORDER — PROSOURCE PLUS PO LIQD: 30.0000 mL | Freq: Two times a day (BID) | ORAL | 0 refills | Status: AC

## 2020-12-11 MED ORDER — VITAMIN B-12 1000 MCG PO TABS: 1000.0000 ug | ORAL_TABLET | Freq: Every day | ORAL | 0 refills | Status: DC

## 2020-12-11 MED ORDER — OXYCODONE HCL 10 MG PO TABS: 10.0000 mg | ORAL_TABLET | Freq: Four times a day (QID) | ORAL | 0 refills | Status: AC | PRN

## 2020-12-11 MED ORDER — INDOMETHACIN ER 75 MG PO CPCR: 75.0000 mg | ORAL_CAPSULE | Freq: Every day | ORAL | Status: DC | PRN

## 2020-12-11 MED ORDER — POLYETHYLENE GLYCOL 3350 17 G PO PACK: 17.0000 g | PACK | Freq: Every day | ORAL | 0 refills | Status: DC | PRN

## 2020-12-11 MED ORDER — FERROUS SULFATE 325 (65 FE) MG PO TBEC: 325.0000 mg | DELAYED_RELEASE_TABLET | Freq: Two times a day (BID) | ORAL | 0 refills | Status: DC

## 2020-12-11 NOTE — Progress Notes (Signed)
Patient discharging home. Vital signs stable at time of discharge as reflected in discharge summary. Discharge instructions given and verbal understanding returned. Patient discharging with at home wound vac.

## 2020-12-11 NOTE — TOC Initial Note (Addendum)
Transition of Care [facility]) - Initial/Assessment Note    Patient Details  Name: Robert Huang MRN: [id number] Date of Birth: 11-Feb-1961  Transition of [facility]) CM/SW Contact:    Norina Buzzard, RN Phone Number: 12/11/2020, 1:11 PM  Clinical Narrative: Pt presented in the ED with cc of increased redness and pain associated with with chronic ulcer fourth metatarsal head left foot. In ED found to have developed cellulitis surrounding ulcer on dorsal aspect of L foot. Pt is s/p L transmetatarsal amputation. PMH significant for DM 2, HTN, CAD, hypothyroidism and peripheral vascular disease with multiple episodes of diabetic foot infection and osteomyelitis resulting in amputation of left fifth ray  and amputation right great toe in July 2020        Received referral to assist with [facility] RN, PT, and DME (RW). Met with pt. He plans to return home with the support of his wife. He has a RW. He reports that he doesn't have a preference for [facility] agency. [facility] is in network with [employer]. Contacted [facility], spoke with [place], she reports that nursing and therapy are out 7-8 days. Contacted [facility], spoke with Malachy Mood, she reports that they are OON. Contacted [facility], spoke with intake, she reports that they are not able to accept the referral at this time. Notified Dr. Sloan Leiter that [facility] is not able to send a therapist or a nurse this week, but they can send the nurse and therapist in 7-8 days. Dr. Sloan Leiter stated to let the pt know about the [facility] issue and see if pt is ok with this. Met with pt and he is ok with [facility] seeing him in 7-8 days. Contacted Britney with [facility] and made her aware that the pt is ok with RN/PT seeing him in 7-8 days.     Expected Discharge Plan: [place]     Patient Goals and CMS Choice Patient states their goals for this hospitalization and ongoing recovery are:: to get better   Choice offered  to / list presented to : Patient  Expected Discharge Plan and Services Expected Discharge Plan: [place]   Discharge Planning Services: CM Consult Post Acute Care Choice: [place] arrangements for the past 2 months: Single Family Home Expected Discharge Date: 12/11/20                         HH Arranged: RN,PT [place] Agency: [facility] [employer] Date [place]: 12/11/20 Time [place]: 1106 Representative spoke with at [place]: Lesly Rubenstein  Prior Living Arrangements/Services Living arrangements for the past 2 months: [place] with:: Spouse Patient language and need for interpreter reviewed:: Yes Do you feel safe going back to the place where you live?: Yes               Activities of Daily Living      Permission Sought/Granted Permission sought to share information with : Case Manager                Emotional Assessment Appearance:: Well-Groomed Attitude/Demeanor/Rapport: Self-Confident,Engaged,Gracious Affect (typically observed): Accepting,Pleasant,Calm Orientation: : Oriented to Self,Oriented to Place,Oriented to  Time,Oriented to Situation      Admission diagnosis:  Cellulitis of left foot [L03.116] Diabetic foot infection ([place]) [D66.440, L08.9] Patient Active Problem List   Diagnosis Date Noted  . Cellulitis of left foot   .  History of complete ray amputation of fifth toe of left foot ([place])   . Status post amputation of right great toe ([place])   . Diabetic foot infection ([place]) 12/08/2020  . Diabetic foot ulcers ([place]) 03/02/2019  . Osteomyelitis of fourth toe of left foot ([place]) 03/02/2019  . Hypokalemia 03/02/2019  . Chronic right shoulder pain 12/02/2018  . Diabetes mellitus with complication ([place]) 17/47/1595  . Right hip pain 12/27/2015  . MRSA (methicillin resistant staph aureus) culture positive 12/31/2012  . Open displaced fracture of right great toe 05/20/2012   . Anxiety state 12/25/2008  . Coronary atherosclerosis 12/25/2008  . CHEST PAIN 12/25/2008  . Hypothyroidism 05/27/2007  . Dyslipidemia 05/27/2007  . Gout 05/27/2007  . Essential hypertension 05/27/2007  . Osteoarthrosis, unspecified whether generalized or localized, unspecified site 05/27/2007  . RENAL CALCULUS, HX OF 05/27/2007   PCP:  [name], MD Pharmacy:   [id number] [id number]: [phone]: [phone]    Social Determinants of Health (SDOH) Interventions    Readmission Risk Interventions No flowsheet data found.

## 2020-12-11 NOTE — Progress Notes (Signed)
Orthopedic Tech Progress Note Patient Details:  Tabius Rood [id number] 1960-09-13 [id number]  Ortho Devices Type of Ortho Device: Postop shoe/boot Ortho Device/Splint Location: Left Lower Extremity Ortho Device/Splint Interventions: Ordered,Application   Post Interventions Patient Tolerated: Well Instructions Provided: Adjustment of device,Care of device,Poper ambulation with device   Gerald Stabs 12/11/2020, 4:13 PM

## 2020-12-11 NOTE — Progress Notes (Signed)
Received call from [place] with [facility]. She reports that she contacted the office for the referral and they declined it. Notified Dr. Sloan Leiter and asked if outpt rehab could be an option and he agreed. Met with pt and informed him of above, but he declined outpt rehab. He reports that he is fit, he knows how to use the walker and crutches, because he works for [employer] and he also knows about wound care. He reports that his wife also works for [employer].

## 2020-12-11 NOTE — Progress Notes (Signed)
Physical Therapy Treatment Patient Details Name: Robert Huang MRN: [id number] DOB: 08-Nov-1960 Today's Date: 12/11/2020    History of Present Illness 60 y.o. male who presents to ED 12/08/20 with c/o increased redness and pain associated with with chronic ulcer fourth metatarsal head left foot. In ED found to have developed cellulitis surrounding ulcer on dorsal aspect of L foot. Dr Lajoyce Corners consulted and pt s/p 12/10/20 L transmetatarsal amputation. PMH significant for DM 2, HTN, CAD, hypothyroidism and peripheral vascular disease with multiple episodes of diabetic foot infection and osteomyelitis resulting in amputation of left fifth ray  and amputation right great toe in July 2020    PT Comments    Pt making great progress with mobility this date, ascending and descending 2 stairs and ambulating x2 bouts up to ~100-250 ft per bout. Pt is able to enter/exit his home and mobilize around the home to live on the main floor of his house safely with supervision. Pt benefits from bil UE support with gait and stairs and progressed to only needing min guard for safety after education and practice trials performed with all mobility this date. Educated pt on negotiating stairs with crutches and provided pt with handout. Educated pt to have supervision with gait and stairs initially going home. Pt felt more comfortable and had decreased shoulder and hand pain when using crutches instead of the RW, thus recommending pt obtain crutches prior to d/c home. Will continue to follow acutely.   Follow Up Recommendations  No PT follow up;Other (comment);Supervision for mobility/OOB (recommend PT for gait training if need after weightbearing restrictions lifted)     Equipment Recommendations  Crutches    Recommendations for Other Services       Precautions / Restrictions Precautions Precautions: Fall Precaution Comments: Wound Vac Restrictions Weight Bearing Restrictions: Yes LLE Weight Bearing: Partial  weight bearing Other Position/Activity Restrictions: Touchdown weightbearing on the heel is acceptable however nonweightbearing is ideal on the left lower extremity    Mobility  Bed Mobility               General bed mobility comments: Pt standing up with NT upon arrival.    Transfers Overall transfer level: Needs assistance Equipment used: Rolling walker (2 wheeled);Crutches Transfers: Sit to/from Stand Sit to Stand: Min guard;Min assist         General transfer comment: Min guard for safety coming to stand with RW. MinA first rep with use of crutches to steady pt but progressed to min guard with subsequent reps, cuing pt to place both crutches in one hand to allow push up form surface with other hand, success. Cues to reach back for target sitting surface prior to sitting, success.  Ambulation/Gait Ambulation/Gait assistance: Min guard;Min assist Gait Distance (Feet): 250 Feet (x2 bouts of ~250 ft > ~100 ft) Assistive device: Rolling walker (2 wheeled);Crutches Gait Pattern/deviations: Trunk flexed (swing-through) Gait velocity: slowed Gait velocity interpretation: <1.8 ft/sec, indicate of risk for recurrent falls General Gait Details: Pt using RW initially, demonstrating swing-through with R foot and floating L leg off ground wiht intermittent touch down on heel. Pt demonstrating increased usage of upper traps and triceps and reporting pain in shoulders and hands with increased distance, needing several standing rest breaks. Progressed to using crutches, initially providing minA due to crutches slipping on floor with lateral placement but once corrected he only needing min guard.   Stairs Stairs: Yes Stairs assistance: Min guard;Min assist Stair Management: One rail Left;One rail Right Number of Stairs: 2  General stair comments: Ascending forward with L rail only x1 stair with minA then with R hand on RW on stair x1 stair with min guard. Descending with L rail only  backwards 1x with minA and then forwards with R rail and L hand on RW on stairs 1x with min guard. Pt educated on use of crutches on stairs with handout provided.   Wheelchair Mobility    Modified Rankin (Stroke Patients Only)       Balance Overall balance assessment: Needs assistance Sitting-balance support: Feet supported;Feet unsupported;No upper extremity supported Sitting balance-Leahy Scale: Good     Standing balance support: During functional activity;Bilateral upper extremity supported;Single extremity supported Standing balance-Leahy Scale: Poor Standing balance comment: requires 1-2 UE support for maintaining R LE single leg stance                            Cognition Arousal/Alertness: Awake/alert Behavior During Therapy: WFL for tasks assessed/performed Overall Cognitive Status: Within Functional Limits for tasks assessed                                        Exercises      General Comments        Pertinent Vitals/Pain Pain Assessment: Faces Faces Pain Scale: Hurts a little bit Pain Location: residual limb Pain Descriptors / Indicators: Discomfort;Guarding Pain Intervention(s): Limited activity within patient's tolerance;Monitored during session;Repositioned    Home Living                      Prior Function            PT Goals (current goals can now be found in the care plan section) Acute Rehab PT Goals Patient Stated Goal: to home for his birthday today PT Goal Formulation: With patient/family Time For Goal Achievement: 12/24/20 Potential to Achieve Goals: Good Progress towards PT goals: Progressing toward goals    Frequency    Min 3X/week      PT Plan Current plan remains appropriate;Equipment recommendations need to be updated    Co-evaluation              AM-PAC PT "6 Clicks" Mobility   Outcome Measure  Help needed turning from your back to your side while in a flat bed without using  bedrails?: None Help needed moving from lying on your back to sitting on the side of a flat bed without using bedrails?: None Help needed moving to and from a bed to a chair (including a wheelchair)?: None Help needed standing up from a chair using your arms (e.g., wheelchair or bedside chair)?: None Help needed to walk in hospital room?: A Little Help needed climbing 3-5 steps with a railing? : A Little 6 Click Score: 22    End of Session Equipment Utilized During Treatment: Gait belt Activity Tolerance: Patient tolerated treatment well Patient left: in chair;with call bell/phone within reach;with chair alarm set Nurse Communication: Mobility status PT Visit Diagnosis: Unsteadiness on feet (R26.81);Other abnormalities of gait and mobility (R26.89);Muscle weakness (generalized) (M62.81);Difficulty in walking, not elsewhere classified (R26.2);Pain Pain - Right/Left: Left Pain - part of body: Ankle and joints of foot     Time: 0093-8182 PT Time Calculation (min) (ACUTE ONLY): 41 min  Charges:  $Gait Training: 23-37 mins $Therapeutic Activity: 8-22 mins  Raymond Gurney, PT, DPT Acute Rehabilitation Services  Pager: [phone] Office: [phone]    Jewel Baize 12/11/2020, 12:24 PM

## 2020-12-11 NOTE — Discharge Summary (Addendum)
PATIENT DETAILS Name: Robert Huang [id number] Age: 60 y.o. Sex: male Date of Birth: May 28, 1961 MRN: [id number]. Admitting Physician: Vashti Hey, MD PCP:Fry, Ishmael Holter, MD  Admit Date: 12/08/2020 Discharge date: 12/11/2020  Recommendations for Outpatient Follow-up:  1. Follow up with PCP in 1-2 weeks 2. Please obtain CMP/CBC in one week 3. Please ensure follow-up with orthopedics 4. Please ensure follow-up with gastroenterology-FOBT positive stools 5. Check vitamin B12 levels in 3 to 6 weeks  Admitted From:  Home  Disposition: Home with home health services   Home Health:  Yes  Equipment/Devices: Continue wound VAC for 1 week  Discharge Condition: Stable  CODE STATUS: FULL CODE  Diet recommendation:  Diet Order            Diet - low sodium heart healthy           Diet Carb Modified Fluid consistency: Thin; Room service appropriate? Yes  Diet effective now                 Brief Narrative: Patient is a 60 y.o. male with history of CAD, DM-2, HTN, hypothyroidism, HLD-chronic left foot ulcer (beneath fourth metatarsal head)-presenting with diabetic foot infection with acute osteomyelitis.   Significant events: 4/15>> admit to [facility]-diabetic foot infection/acute osteo of left foot metatarsal head 4/16>> left transmetatarsal amputation  Significant studies: 4/14>> x-ray left foot: dislocation of fourth metatarsal joint-no evidence of osteo  Antimicrobial therapy: Vancomycin: 4/14>>4/17 Cefepime: 4/14>>4/17 Flagyl: 4/14>>4/17  Microbiology data: 4/14>> Covid PCR: Negative  Procedures : 4/16>> left transmetatarsal amputation  Consults: [facility]: Left diabetic foot with osteomyelitis of left fourth metatarsal head (probe test positive): Underwent left transmetatarsal amputation on 4/16-was on empiric antibiotics as noted above-this was discontinued on 4/17 per orthopedics.  Discussed with Dr. Sharol Given today-okay for  discharge-continue Praveena pump on discharge-he will follow patient in 1 week.    Normocytic anemia: Due to chronic left foot wound-no evidence of blood loss.  FOBT +ve: No overt bleeding-Stable for outpatient follow-up with GI MD-continue iron supplementation on discharge.  Per patient-he has never had a colonoscopy-he is aware of risk of having undiagnosed colon cancer.  Strongly recommend that he get a outpatient colonoscopy.  Vitamin B12 deficiency: Continue supplementation.  Repeat vitamin B12 levels in 3 to 4 weeks.  Hypokalemia: Repleted.  HTN: BP controlled-continue amlodipine, clonidine, lisinopril, HCTZ-PCP to adjust further.  HLD: Continue statin, Zetia and fenofibrate  CAD: No anginal symptoms-Per patient-he is easily able to walk around and climb a couple of flight of stairs without any symptoms.  Hypothyroidism: Continue with levothyroxine  Gout: On colchicine-no flare  DM-2 (A1c 6.6 on 4/14): CBG stable-with SSI-resume oral hypoglycemic agents on discharge.    Prior history of left fifth ray amputation and right great toe amputation  Generalized anxiety disorder: Continue as needed Xanax   Discharge Diagnoses:  Principal Problem:   Diabetic foot infection ([place]) Active Problems:   Hypothyroidism   Diabetes mellitus with complication ([place])   Osteomyelitis of fourth toe of left foot ([place])   Cellulitis of left foot   History of complete ray amputation of fifth toe of left foot ([place])   Status post amputation of right great toe [facility])   Discharge Instructions:  Activity:  Touchdown weightbearing on the heel is acceptable however nonweightbearing is ideal on the left lower extremity   Discharge Instructions    Call MD for:  redness, tenderness, or signs of infection (pain, swelling, redness, odor or green/yellow discharge  around incision site)   Complete by: As directed    Call MD for:  temperature >100.4   Complete by: As directed    Diet - low  sodium heart healthy   Complete by: As directed    Discharge instructions   Complete by: As directed    Follow with Primary MD  [name], MD in 1-2 weeks  Follow-up with Dr. Sharol Given in 1 week-keep wound VAC in place until then.  Please get a complete blood count and chemistry panel checked by your Primary MD at your next visit, and again as instructed by your Primary MD.  Get Medicines reviewed and adjusted: Please take all your medications with you for your next visit with your Primary MD  Laboratory/radiological data: Please request your Primary MD to go over all hospital tests and procedure/radiological results at the follow up, please ask your Primary MD to get all Hospital records sent to his/her office.  In some cases, they will be blood work, cultures and biopsy results pending at the time of your discharge. Please request that your primary care M.D. follows up on these results.  Also Note the following: If you experience worsening of your admission symptoms, develop shortness of breath, life threatening emergency, suicidal or homicidal thoughts you must seek medical attention immediately by calling 911 or calling your MD immediately  if symptoms less severe.  You must read complete instructions/literature along with all the possible adverse reactions/side effects for all the Medicines you take and that have been prescribed to you. Take any new Medicines after you have completely understood and accpet all the possible adverse reactions/side effects.   Do not drive when taking Pain medications or sleeping medications (Benzodaizepines)  Do not take more than prescribed Pain, Sleep and Anxiety Medications. It is not advisable to combine anxiety,sleep and pain medications without talking with your primary care practitioner  Special Instructions: If you have smoked or chewed Tobacco  in the last 2 yrs please stop smoking, stop any regular Alcohol  and or any Recreational drug  use.  Wear Seat belts while driving.  Please note: You were cared for by a hospitalist during your hospital stay. Once you are discharged, your primary care physician will handle any further medical issues. Please note that NO REFILLS for any discharge medications will be authorized once you are discharged, as it is imperative that you return to your primary care physician (or establish a relationship with a primary care physician if you do not have one) for your post hospital discharge needs so that they can reassess your need for medications and monitor your lab values.   1.)  Your vitamin B12 levels were low-you have been started on vitamin B12 supplementation-please ask your primary care practitioner to recheck vitamin B-12 levels in a few weeks.  2.)  You had blood detected in your stools-you will require outpatient GI work-up including a colonoscopy.  Please ask for  GI referral from your primary care practitioner.   Increase activity slowly   Complete by: As directed    Touchdown weightbearing on the heel is acceptable however nonweightbearing is ideal on the left lower extremity   Leave dressing on - Keep it clean, dry, and intact until clinic visit   Complete by: As directed    Negative Pressure Wound Therapy - Incisional   Complete by: As directed    Touch down weight bearing   Complete by: As directed    Laterality: left   Extremity:  Lower     Allergies as of 12/11/2020   No Known Allergies     Medication List    STOP taking these medications   ciprofloxacin 500 MG tablet Commonly known as: CIPRO   HYDROcodone-acetaminophen 10-325 MG tablet Commonly known as: NORCO   linezolid 600 MG tablet Commonly known as: ZYVOX     TAKE these medications   (feeding supplement) PROSource Plus liquid Take 30 mLs by mouth 2 (two) times daily between meals.   acetaminophen 325 MG tablet Commonly known as: TYLENOL Take 2 tablets (650 mg total) by mouth every 6 (six) hours as  needed for mild pain (or Fever >/= 101).   ALPRAZolam 1 MG tablet Commonly known as: XANAX TAKE 1 TABLET BY MOUTH 3 TIMES DAILY AS NEEDED FOR ANXIETY. What changed: when to take this   amLODipine 10 MG tablet Commonly known as: NORVASC TAKE 1 TABLET (10 MG TOTAL) BY MOUTH DAILY. <PLEASE MAKE APPOINTMENT FOR REFILLS>   Aspirin-Acetaminophen-Caffeine 500-325-65 MG Pack Take 1 Package by mouth daily as needed (pain).   cloNIDine 0.3 MG tablet Commonly known as: CATAPRES TAKE 1 TABLET (0.3 MG TOTAL) BY MOUTH 2 (TWO) TIMES DAILY. NEED OV. What changed: additional instructions   colchicine 0.6 MG tablet TAKE 1 TABLET (0.6 MG TOTAL) BY MOUTH EVERY 6 (SIX) HOURS AS NEEDED (GOUT).   ezetimibe 10 MG tablet Commonly known as: ZETIA TAKE 1 TABLET BY MOUTH EVERY DAY   fenofibrate 160 MG tablet TAKE 1 TABLET (160 MG TOTAL) BY MOUTH DAILY. NEED OV. What changed: additional instructions   ferrous sulfate 325 (65 FE) MG EC tablet Take 1 tablet (325 mg total) by mouth 2 (two) times daily.   fluticasone 50 MCG/ACT nasal spray Commonly known as: FLONASE Place 2 sprays into both nostrils daily.   glipiZIDE 5 MG tablet Commonly known as: GLUCOTROL glipizide 5 mg tablet  TAKE 1 TABLET (5 MG TOTAL) BY MOUTH 2 (TWO) TIMES DAILY BEFORE A MEAL. What changed:   how much to take  how to take this  when to take this  additional instructions   indomethacin 75 MG CR capsule Commonly known as: INDOCIN SR Take 1 capsule (75 mg total) by mouth daily as needed for moderate pain. What changed: See the new instructions.   levothyroxine 112 MCG tablet Commonly known as: SYNTHROID TAKE 1 TABLET BY MOUTH EVERY DAY What changed: when to take this   lisinopril-hydrochlorothiazide 20-12.5 MG tablet Commonly known as: ZESTORETIC TAKE 1 TABLET BY MOUTH 2 (TWO) TIMES DAILY. 10AM AND 5PM What changed: additional instructions   metFORMIN 1000 MG tablet Commonly known as: GLUCOPHAGE TAKE 1 TABLET  BY MOUTH TWO TIMES A DAY WITH A MEAL What changed: See the new instructions.   metoprolol succinate 100 MG 24 hr tablet Commonly known as: TOPROL-XL TAKE 1 TABLET BY MOUTH TWICE A DAY   nitroGLYCERIN 0.4 MG SL tablet Commonly known as: NITROSTAT Place 1 tablet (0.4 mg total) under the tongue every 5 (five) minutes as needed. For chest pain. What changed:   reasons to take this  additional instructions   omeprazole 40 MG capsule Commonly known as: PRILOSEC Take 1 capsule (40 mg total) by mouth daily.   OneTouch Ultra test strip Generic drug: glucose blood 1 EACH BY OTHER ROUTE DAILY. USE AS INSTRUCTED   Oxycodone HCl 10 MG Tabs Take 1 tablet (10 mg total) by mouth every 6 (six) hours as needed for up to 5 days for moderate pain (pain score 4-6).  polyethylene glycol 17 g packet Commonly known as: MiraLax Take 17 g by mouth daily as needed.   rosuvastatin 40 MG tablet Commonly known as: CRESTOR TAKE 1 TABLET (40 MG TOTAL) BY MOUTH DAILY. <PLEASE MAKE APPOINTMENT FOR REFILLS> What changed: additional instructions   vitamin B-12 1000 MCG tablet Commonly known as: CYANOCOBALAMIN Take 1 tablet (1,000 mcg total) by mouth daily.   zolpidem 10 MG tablet Commonly known as: AMBIEN TAKE 1 TABLET BY MOUTH AT BEDTIME AS NEEDED FOR SLEEP. What changed:   reasons to take this  additional instructions            Durable Medical Equipment  (From admission, onward)         Start     Ordered   12/11/20 0954  For home use only DME Walker rolling  Once       Question Answer Comment  Walker: With Bessie Wheels   Patient needs a walker to treat with the following condition Physical deconditioning      12/11/20 0953           Discharge Care Instructions  (From admission, onward)         Start     Ordered   12/11/20 0000  Leave dressing on - Keep it clean, dry, and intact until clinic visit        12/11/20 1011   12/10/20 0000  Touch down weight bearing        Question Answer Comment  Laterality left   Extremity Lower      12/10/20 0843          Follow-up Information    Newt Minion, MD In 1 week.   Specialty: Orthopedic Surgery Contact information: [place] [address] [phone]        [name], MD. Schedule an appointment as soon as possible for a visit in 1 week(s).   Specialty: Family Medicine Contact information: [place] [address] [phone]        Lelon Perla, MD Follow up in 1 month(s).   Specialty: Cardiology Contact information: [address] [place] [address] [phone]              No Known Allergies   Other Procedures/Studies: DG Foot Complete Left  Result Date: 12/08/2020 CLINICAL DATA:  Left foot infection. EXAM: LEFT FOOT - COMPLETE 3+ VIEW COMPARISON:  October 13, 2020. FINDINGS: Status post amputation of fifth metatarsal. Degenerative changes seen involving the first metatarsophalangeal joint. Dislocation of fourth metatarsophalangeal joint is noted. No acute fracture is noted. No lytic destruction is noted to suggest osteomyelitis. IMPRESSION: Stable postsurgical and degenerative changes as described above. Interval development of dislocation of fourth metatarsophalangeal joint. No definite evidence of osteomyelitis. Electronically Signed   By: Marijo Conception M.D.   On: 12/08/2020 12:18     TODAY-DAY OF DISCHARGE:  Subjective:   Robert Huang today has no headache,no chest abdominal pain,no new weakness tingling or numbness, feels much better wants to go home today.   Objective:   Blood pressure (!) 145/79, pulse 65, temperature 98.2 F (36.8 C), temperature source Oral, resp. rate 18, height 6' (1.829 m), weight 92.8 kg, SpO2 96 %.  Intake/Output Summary (Last 24 hours) at 12/11/2020 1020 Last data filed at 12/11/2020 0330 Gross per 24 hour  Intake 1592.79 ml  Output 670 ml  Net 922.79 ml   Filed  Weights   12/08/20 1400 12/10/20 0416  Weight: 93 kg 92.8 kg    Exam: Awake Alert, Oriented *3, No new F.N deficits, Normal affect [place].AT,PERRAL Supple Neck,No JVD, No cervical lymphadenopathy appriciated.  Symmetrical Chest wall movement, Good air movement bilaterally, CTAB RRR,No Gallops,Rubs or new Murmurs, No Parasternal Heave +ve B.Sounds, Abd Soft, Non tender, No organomegaly appriciated, No rebound -guarding or rigidity. No Cyanosis, Clubbing or edema, No new Rash or bruise   PERTINENT RADIOLOGIC STUDIES: No results found.   PERTINENT LAB RESULTS: CBC: Recent Labs    12/10/20 0043 12/11/20 0317  WBC 4.6 7.5  HGB 9.7* 10.0*  HCT 31.1* 33.1*  PLT 299 333   CMET CMP     Component Value Date/Time   NA 140 12/11/2020 0317   K 3.6 12/11/2020 0317   CL 109 12/11/2020 0317   CO2 23 12/11/2020 0317   GLUCOSE 207 (H) 12/11/2020 0317   BUN 20 12/11/2020 0317   CREATININE 1.13 12/11/2020 0317   CALCIUM 8.9 12/11/2020 0317   PROT 7.1 12/08/2020 1149   PROT 7.5 05/26/2018 1606   ALBUMIN 3.0 (L) 12/08/2020 1149   ALBUMIN 4.5 05/26/2018 1606   AST 16 12/08/2020 1149   ALT 14 12/08/2020 1149   ALKPHOS 56 12/08/2020 1149   BILITOT 0.5 12/08/2020 1149   BILITOT 0.4 05/26/2018 1606   GFRNONAA >60 12/11/2020 0317   GFRAA >60 03/09/2019 0324    GFR Estimated Creatinine Clearance: 75.3 mL/min (by C-G formula based on SCr of 1.13 mg/dL). No results for input(s): LIPASE, AMYLASE in the last 72 hours. No results for input(s): CKTOTAL, CKMB, CKMBINDEX, TROPONINI in the last 72 hours. Invalid input(s): POCBNP No results for input(s): DDIMER in the last 72 hours. Recent Labs    12/08/20 1805  HGBA1C 6.6*   No results for input(s): CHOL, HDL, LDLCALC, TRIG, CHOLHDL, LDLDIRECT in the last 72 hours. No results for input(s): TSH, T4TOTAL, T3FREE, THYROIDAB in the last 72 hours.  Invalid input(s): FREET3 Recent Labs    12/09/20 0111  VITAMINB12 57*  FOLATE 7.9  FERRITIN  56  TIBC 333  IRON 32*   Coags: No results for input(s): INR in the last 72 hours.  Invalid input(s): PT Microbiology: Recent Results (from the past 240 hour(s))  Aerobic Culture w Gram Stain (superficial specimen)     Status: None   Collection Time: 12/06/20 12:58 PM   Specimen: Wound  Result Value Ref Range Status   Specimen Description   Final    WOUND LEFT 4TH MET HEAD Performed at [facility] [address], [place], [place] [address]    Special Requests   Final    NONE Performed at [facility], [place] [address] [place], [place] [address]    Gram Stain   Final    FEW WBC PRESENT, PREDOMINANTLY PMN FEW GRAM POSITIVE COCCI IN PAIRS IN CLUSTERS    Culture   Final    FEW STREPTOCOCCUS AGALACTIAE TESTING AGAINST S. AGALACTIAE NOT ROUTINELY PERFORMED DUE TO PREDICTABILITY OF AMP/PEN/VAN SUSCEPTIBILITY. WITHIN MIXED ORGANISMS Performed at [facility], [place] [address], [place], [place] [address]    Report Status 12/08/2020 FINAL  Final  SARS CORONAVIRUS 2 (TAT 6-24 HRS) Nasopharyngeal Nasopharyngeal Swab     Status: None   Collection Time: 12/08/20  3:27 PM   Specimen: Nasopharyngeal Swab  Result Value Ref Range Status   SARS Coronavirus 2 NEGATIVE NEGATIVE Final    Comment: (NOTE) SARS-CoV-2 target nucleic acids are NOT DETECTED.  The SARS-CoV-2 RNA  is generally detectable in upper and lower respiratory specimens during the acute phase of infection. Negative results do not preclude SARS-CoV-2 infection, do not rule out co-infections with other pathogens, and should not be used as the sole basis for treatment or other patient management decisions. Negative results must be combined with clinical observations, patient history, and epidemiological information. The expected result is Negative.  Fact Sheet for Patients: SugarRoll.be  Fact Sheet for Healthcare  Providers: https://www.woods-mathews.com/  This test is not yet approved or cleared by the [place] FDA and  has been authorized for detection and/or diagnosis of SARS-CoV-2 by FDA under an Emergency Use Authorization (EUA). This EUA will remain  in effect (meaning this test can be used) for the duration of the COVID-19 declaration under Se ction 564(b)(1) of the Act, 21 U.S.C. section 360bbb-3(b)(1), unless the authorization is terminated or revoked sooner.  Performed at [facility], [place] [address] [place], [place] [address]   Surgical PCR screen     Status: None   Collection Time: 12/09/20  8:24 PM   Specimen: Nasal Mucosa; Nasal Swab  Result Value Ref Range Status   MRSA, PCR NEGATIVE NEGATIVE Final   Staphylococcus aureus NEGATIVE NEGATIVE Final    Comment: (NOTE) The Xpert SA Assay (FDA approved for NASAL specimens in patients 80 years of age and older), is one component of a comprehensive surveillance program. It is not intended to diagnose infection nor to guide or monitor treatment. Performed at [facility], [place] [address], [place], [place] [address]     FURTHER DISCHARGE INSTRUCTIONS:  Get Medicines reviewed and adjusted: Please take all your medications with you for your next visit with your Primary MD  Laboratory/radiological data: Please request your Primary MD to go over all hospital tests and procedure/radiological results at the follow up, please ask your Primary MD to get all Hospital records sent to his/her office.  In some cases, they will be blood work, cultures and biopsy results pending at the time of your discharge. Please request that your primary care M.D. goes through all the records of your hospital data and follows up on these results.  Also Note the following: If you experience worsening of your admission symptoms, develop shortness of breath, life threatening emergency, suicidal or homicidal thoughts you must seek  medical attention immediately by calling 911 or calling your MD immediately  if symptoms less severe.  You must read complete instructions/literature along with all the possible adverse reactions/side effects for all the Medicines you take and that have been prescribed to you. Take any new Medicines after you have completely understood and accpet all the possible adverse reactions/side effects.   Do not drive when taking Pain medications or sleeping medications (Benzodaizepines)  Do not take more than prescribed Pain, Sleep and Anxiety Medications. It is not advisable to combine anxiety,sleep and pain medications without talking with your primary care practitioner  Special Instructions: If you have smoked or chewed Tobacco  in the last 2 yrs please stop smoking, stop any regular Alcohol  and or any Recreational drug use.  Wear Seat belts while driving.  Please note: You were cared for by a hospitalist during your hospital stay. Once you are discharged, your primary care physician will handle any further medical issues. Please note that NO REFILLS for any discharge medications will be authorized once you are discharged, as it is imperative that you return to your primary care physician (or establish a relationship with a primary  care physician if you do not have one) for your post hospital discharge needs so that they can reassess your need for medications and monitor your lab values.  Total Time spent coordinating discharge including counseling, education and face to face time equals 35 minutes.  Signed: Commodore Huang 12/11/2020 10:20 AM

## 2020-12-11 NOTE — Progress Notes (Signed)
Spoke with Dr. Loney Loh in regards to positive occult stool on 4/15. No report of further dark or bloody stools per patient or previous dayshift RN. Orders received from MD to continue Smith heparin post-op and note has been left for attending MD to review in AM. Will medicate as ordered and monitor.

## 2020-12-12 ENCOUNTER — Telehealth: Payer: Self-pay

## 2020-12-12 DIAGNOSIS — IMO0002 Reserved for concepts with insufficient information to code with codable children: Secondary | ICD-10-CM

## 2020-12-12 DIAGNOSIS — E1152 Type 2 diabetes mellitus with diabetic peripheral angiopathy with gangrene: Secondary | ICD-10-CM

## 2020-12-12 NOTE — Telephone Encounter (Cosign Needed)
Transition Care Management Follow-up Telephone Call  Date of discharge and from where: 12/11/2020  [facility]   How have you been since you were released from the hospital? Doing well  Any questions or concerns? No  Items Reviewed:  Did the pt receive and understand the discharge instructions provided? Yes   Medications obtained and verified? Yes   Other? No   Any new allergies since your discharge? No   Dietary orders reviewed? Yes  Do you have support at home? Yes   Home Care and Equipment/Supplies: Were home health services ordered? no If so, what is the name of the agency? n/a  Has the agency set up a time to come to the patient's home? not applicable Were any new equipment or medical supplies ordered?  No What is the name of the medical supply agency? n/a Were you able to get the supplies/equipment? no Do you have any questions related to the use of the equipment or supplies? No  Functional Questionnaire: (I = Independent and D = Dependent) ADLs: I   Bathing/Dressing- i  Meal Prep- I  Eating- I  Maintaining continence- I  Transferring/Ambulation- I  Managing Meds- Discussed with patient concern cost diabetic testing strip and one touch meter as well other diabetic medication   Follow up appointments reviewed:   PCP Hospital f/u appt confirmed? Yes  Scheduled to see Dr. Clent Ridges on 12/19/2020 @ 200pm  Specialist Hospital f/u appt confirmed? Yes  Scheduled to see Dr. Lajoyce Corners  On 12/19/2020 @ 1000am  Are transportation arrangements needed? No /  If their condition worsens, is the pt aware to call PCP or go to the Emergency Dept.? Yes  Was the patient provided with contact information for the PCP's office or ED? Yes  Was to pt encouraged to call back with questions or concerns? Yes

## 2020-12-12 NOTE — Telephone Encounter (Cosign Needed)
Transition Care Management Unsuccessful Follow-up Telephone Call  Date of discharge and from where:  12/11/2020  [facility]   Attempts:  1st Attempt  Reason for unsuccessful TCM follow-up call:  Left voice message

## 2020-12-19 ENCOUNTER — Telehealth: Payer: Self-pay | Admitting: Orthopedic Surgery

## 2020-12-19 ENCOUNTER — Ambulatory Visit (INDEPENDENT_AMBULATORY_CARE_PROVIDER_SITE_OTHER): Payer: 59 | Admitting: Family Medicine

## 2020-12-19 ENCOUNTER — Encounter: Payer: Self-pay | Admitting: Family Medicine

## 2020-12-19 ENCOUNTER — Ambulatory Visit (INDEPENDENT_AMBULATORY_CARE_PROVIDER_SITE_OTHER): Payer: 59 | Admitting: Orthopedic Surgery

## 2020-12-19 ENCOUNTER — Other Ambulatory Visit: Payer: Self-pay

## 2020-12-19 VITALS — BP 138/78 | HR 70 | Temp 98.4°F | Wt 202.0 lb

## 2020-12-19 DIAGNOSIS — L089 Local infection of the skin and subcutaneous tissue, unspecified: Secondary | ICD-10-CM | POA: Diagnosis not present

## 2020-12-19 DIAGNOSIS — I10 Essential (primary) hypertension: Secondary | ICD-10-CM | POA: Diagnosis not present

## 2020-12-19 DIAGNOSIS — Z22322 Carrier or suspected carrier of Methicillin resistant Staphylococcus aureus: Secondary | ICD-10-CM | POA: Diagnosis not present

## 2020-12-19 DIAGNOSIS — D509 Iron deficiency anemia, unspecified: Secondary | ICD-10-CM | POA: Diagnosis not present

## 2020-12-19 DIAGNOSIS — E11628 Type 2 diabetes mellitus with other skin complications: Secondary | ICD-10-CM | POA: Diagnosis not present

## 2020-12-19 DIAGNOSIS — Z0000 Encounter for general adult medical examination without abnormal findings: Secondary | ICD-10-CM | POA: Diagnosis not present

## 2020-12-19 DIAGNOSIS — L03116 Cellulitis of left lower limb: Secondary | ICD-10-CM

## 2020-12-19 DIAGNOSIS — M869 Osteomyelitis, unspecified: Secondary | ICD-10-CM

## 2020-12-19 DIAGNOSIS — Z89432 Acquired absence of left foot: Secondary | ICD-10-CM

## 2020-12-19 DIAGNOSIS — E538 Deficiency of other specified B group vitamins: Secondary | ICD-10-CM

## 2020-12-19 MED ORDER — CYANOCOBALAMIN 1000 MCG/ML IJ SOLN
1000.0000 ug | Freq: Once | INTRAMUSCULAR | Status: AC
  Administered 2020-12-19: 1000 ug via INTRAMUSCULAR

## 2020-12-19 MED ORDER — MUPIROCIN 2 % EX OINT: 1.0000 "application " | TOPICAL_OINTMENT | Freq: Two times a day (BID) | CUTANEOUS | 5 refills | Status: DC

## 2020-12-19 MED ORDER — FAMOTIDINE 40 MG PO TABS: 40.0000 mg | ORAL_TABLET | Freq: Every day | ORAL | 3 refills | Status: DC

## 2020-12-19 MED ORDER — "SYRINGE 20G X 1-1/2"" 3 ML MISC": 1.0000 "application " | 11 refills | Status: DC

## 2020-12-19 MED ORDER — CYANOCOBALAMIN 1000 MCG/ML IJ SOLN: 1000.0000 ug | INTRAMUSCULAR | 11 refills | Status: DC

## 2020-12-19 MED ORDER — OXYCODONE HCL 10 MG PO TABS: 10.0000 mg | ORAL_TABLET | ORAL | 0 refills | Status: AC | PRN

## 2020-12-19 NOTE — Telephone Encounter (Signed)
Received $25.00 cash,medical records release form and the attending physician's statement     Forwarding to [facility] today

## 2020-12-19 NOTE — Progress Notes (Signed)
   Subjective:    Patient ID: Robert Huang [id number], male    DOB: 1961-02-16, 60 y.o.   MRN: [id number]  HPI Here to follow up a hospital stay from 12-08-20 to 12-11-20 for osteomyelitis in the he left foot. Cultures of the wound grew MRSA and Enterococcus faecalis. He has a hx of MRSA infections in the past. He had a transmetatarsal amputation of the left foot per Dr. Lajoyce Huang, and the surgery went very well. He was sent home on no antibiotics. He saw Dr. Lajoyce Huang this morning for follow up, and Dr. Lajoyce Huang was very pleased with how the foot is healing. The skin was warm and pink. Robert Huang has a fair amount of pain yet, but it is improving. His diabetes is well controlled, and his A1c on admission was 6.6. However he was found to be anemic with a Hgb of 9.3. His RBC indices showed a normocytic anemia. His iron was low at 32, while the ferritin was low normal at 56. His folate was normal, but the B12 was also low at 57. He was started on oral ferrous sulfate 325 mg BID as well as oral B12 1000 mcg daily. He has never had a colonoscopy, so it was recommended he have one. Of note he has been taking Omeprazole for years to treat GERD.    Review of Systems  Constitutional: Positive for fatigue. Negative for fever.  Respiratory: Negative.   Cardiovascular: Negative.   Gastrointestinal: Negative.        Objective:   Physical Exam Constitutional:      Appearance: Normal appearance.     Comments: Walks with a walker   Cardiovascular:     Rate and Rhythm: Normal rate and regular rhythm.     Pulses: Normal pulses.     Heart sounds: Normal heart sounds.  Pulmonary:     Effort: Pulmonary effort is normal.     Breath sounds: Normal breath sounds.  Musculoskeletal:     Comments: Left foot is wrapped with an ACE bandage   Neurological:     Mental Status: He is alert.           Assessment & Plan:  He is recovering well from a transmetatarsal amputation of the left foot. Because this involved a recurrent MRSA  infection, we will treat him with Mupiricin ointment in the nostrils BID for 3 months. For the anemia, we will refer him for a colonoscopy to look for possible GI sources of blood loss. He will take ferrous sulfate BID as above. For the B12 deficiency, we will stop the oral dosing and start him on shots. He will take a weekly B12 shot for 12 weeks, and then we will check another level. We will repeat CBC, iron, ferritin, and B12 in 12 weeks. Also in case his PPI is contributing to the low B12, we will stop Omeprazole and start him on Pepcid 40 mg daily.  Robert Crane, MD

## 2020-12-20 ENCOUNTER — Telehealth: Payer: Self-pay | Admitting: Orthopedic Surgery

## 2020-12-20 ENCOUNTER — Encounter: Payer: Self-pay | Admitting: Orthopedic Surgery

## 2020-12-20 NOTE — Telephone Encounter (Signed)
Pt would like to know if they could shower and clean the wound. Surgery on 12/10/2020 CB [phone]

## 2020-12-20 NOTE — Telephone Encounter (Signed)
Pt is s/p a transmet amputation and advised that the pt can shower and use antibacterial soap to clean area. Should not submerge foot in water shower only and apply dressing. Pt voiced understanding  and will call with any questions.

## 2020-12-20 NOTE — Progress Notes (Signed)
Office Visit Note   Patient: Robert Huang           Date of Birth: 05-17-1961           MRN: [id number] Visit Date: 12/19/2020              Requested by: [name], MD [address] [place],  [place] [address] PCP: [name], MD  Chief Complaint  Patient presents with  . Left Foot - Routine Post Op    12/10/20 left foot transmet amputation       HPI: Patient is a 60 year old gentleman who presents 1 week status post left foot transmetatarsal amputation he is currently on a kneeling scooter patient states that the wound VAC malfunctioned and he turned this off last night.  Assessment & Plan: Visit Diagnoses:  1. History of transmetatarsal amputation of left foot (HCC)     Plan: Patient will start with dry dressing changes continue nonweightbearing.  Patient was given a prescription for [employer] to obtain a stump shrinker to be worn around-the-clock also prescription for extra-depth shoes custom orthotic spacer and carbon plate.  Anticipate patient will be out of work approximately 3 months.  Follow-Up Instructions: Return in about 1 week (around 12/26/2020).   Ortho Exam  Patient is alert, oriented, no adenopathy, well-dressed, normal affect, normal respiratory effort. Examination the wound edges are well approximated there is some maceration of the plantar flap.  No cellulitis no drainage no signs of infection.  Imaging: No results found. No images are attached to the encounter.  Labs: Lab Results  Component Value Date   HGBA1C 6.6 (H) 12/08/2020   HGBA1C 7.5 (H) 09/28/2020   HGBA1C 7.6 (A) 10/19/2019   ESRSEDRATE 50 (H) 03/02/2019   ESRSEDRATE 63 (H) 07/17/2012   CRP 2.9 (H) 03/02/2019   CRP 1.3 (H) 07/17/2012   LABURIC 7.2 12/03/2017   LABURIC 7.6 01/11/2017   LABURIC 12.7 (H) 11/05/2014   REPTSTATUS 12/08/2020 FINAL 12/06/2020   GRAMSTAIN  12/06/2020    FEW WBC PRESENT, PREDOMINANTLY PMN FEW GRAM POSITIVE COCCI IN PAIRS IN CLUSTERS     CULT  12/06/2020    FEW STREPTOCOCCUS AGALACTIAE TESTING AGAINST S. AGALACTIAE NOT ROUTINELY PERFORMED DUE TO PREDICTABILITY OF AMP/PEN/VAN SUSCEPTIBILITY. WITHIN MIXED ORGANISMS Performed at [facility], [address] [place], [place] [address]    LABORGA METHICILLIN RESISTANT STAPHYLOCOCCUS AUREUS 10/13/2020   LABORGA ENTEROCOCCUS FAECALIS 10/13/2020     Lab Results  Component Value Date   ALBUMIN 3.0 (L) 12/08/2020   ALBUMIN 3.9 09/28/2020   ALBUMIN 3.5 03/07/2019    Lab Results  Component Value Date   MG 1.5 (L) 12/10/2020   MG 1.9 03/07/2019   MG 1.6 (L) 03/06/2019   No results found for: VD25OH  No results found for: PREALBUMIN CBC EXTENDED Latest Ref Rng & Units 12/11/2020 12/10/2020 12/09/2020  WBC 4.0 - 10.5 K/uL 7.5 4.6 5.3  RBC 4.22 - 5.81 MIL/uL 3.44(L) 3.27(L) 3.10(L)  HGB 13.0 - 17.0 g/dL 10.0(L) 9.7(L) 9.3(L)  HCT 39.0 - 52.0 % 33.1(L) 31.1(L) 29.3(L)  PLT 150 - 400 K/uL 333 299 283  NEUTROABS 1.7 - 7.7 K/uL - - -  LYMPHSABS 0.7 - 4.0 K/uL - - -     There is no height or weight on file to calculate BMI.  Orders:  No orders of the defined types were placed in this encounter.  No orders of the defined types were placed in this encounter.  Procedures: No procedures performed  Clinical Data: No additional findings.  ROS:  All other systems negative, except as noted in the HPI. Review of Systems  Objective: Vital Signs: There were no vitals taken for this visit.  Specialty Comments:  No specialty comments available.  PMFS History: Patient Active Problem List   Diagnosis Date Noted  . Iron deficiency anemia 12/19/2020  . Cellulitis of left foot   . History of complete ray amputation of fifth toe of left foot (HCC)   . Status post amputation of right great toe (HCC)   . Diabetic foot infection (HCC) 12/08/2020  . Diabetic foot ulcers (HCC) 03/02/2019  . Osteomyelitis of fourth toe of left foot (HCC) 03/02/2019  . Hypokalemia  03/02/2019  . Chronic right shoulder pain 12/02/2018  . Diabetes mellitus with complication (HCC) 12/03/2017  . Right hip pain 12/27/2015  . MRSA (methicillin resistant staph aureus) culture positive 12/31/2012  . Open displaced fracture of right great toe 05/20/2012  . Anxiety state 12/25/2008  . Coronary atherosclerosis 12/25/2008  . CHEST PAIN 12/25/2008  . Hypothyroidism 05/27/2007  . Dyslipidemia 05/27/2007  . Gout 05/27/2007  . Essential hypertension 05/27/2007  . Osteoarthrosis, unspecified whether generalized or localized, unspecified site 05/27/2007  . RENAL CALCULUS, HX OF 05/27/2007   Past Medical History:  Diagnosis Date  . Anxiety   . CAD (coronary artery disease)    sees Dr. [name], normal Stress test 07-16-12   . Diabetes mellitus   . DJD (degenerative joint disease)   . Gout   . Hyperlipidemia   . Hypertension   . Hypothyroid   . Osteoarthritis   . Renal calculus    hx  . Umbilical hernia     Family History  Problem Relation Age of Onset  . Heart attack Mother   . Arthritis Other   . Coronary artery disease Other   . Diabetes Other   . Hypertension Other   . Prostate cancer Other   . Stroke Other     Past Surgical History:  Procedure Laterality Date  . AMPUTATION  07/17/2012   Procedure: AMPUTATION RAY;  Surgeon: [name], MD;  Location: [facility] OR;  Service: Orthopedics;  Laterality: Right;  right hallux amputation (1st ray resection )  . AMPUTATION Left 03/06/2019   Procedure: Ray Amputation left foot;  Surgeon: [name], MD;  Location: [facility] ORS;  Service: Orthopedics;  Laterality: Left;  . CORONARY ANGIOPLASTY WITH STENT PLACEMENT    . FINGER SURGERY     left index finger  . I & D EXTREMITY  05/19/2012   Procedure: IRRIGATION AND DEBRIDEMENT EXTREMITY;  Surgeon: [name], MD;  Location: [facility] OR;  Service: Orthopedics;  Laterality: Right;  Right Great toe  . I & D Toe  05/19/2012   rt great toe  . TONSILLECTOMY    . TRANSMETATARSAL  AMPUTATION Left 12/10/2020   Procedure: TRANSMETATARSAL AMPUTATION LEFT;  Surgeon: [name], MD;  Location: [facility] OR;  Service: Orthopedics;  Laterality: Left;   Social History   Occupational History  . Not on file  Tobacco Use  . Smoking status: Never Smoker  . Smokeless tobacco: Current User    Types: Chew  . Tobacco comment: occ  Substance and Sexual Activity  . Alcohol use: Yes    Alcohol/week: 0.0 standard drinks    Comment: weekends  . Drug use: No  . Sexual activity: Yes

## 2020-12-21 ENCOUNTER — Other Ambulatory Visit: Payer: Self-pay | Admitting: Family Medicine

## 2020-12-26 ENCOUNTER — Ambulatory Visit (INDEPENDENT_AMBULATORY_CARE_PROVIDER_SITE_OTHER): Payer: 59 | Admitting: Physician Assistant

## 2020-12-26 ENCOUNTER — Encounter: Payer: Self-pay | Admitting: Orthopedic Surgery

## 2020-12-26 ENCOUNTER — Telehealth: Payer: Self-pay | Admitting: Family Medicine

## 2020-12-26 DIAGNOSIS — Z89432 Acquired absence of left foot: Secondary | ICD-10-CM

## 2020-12-26 NOTE — Progress Notes (Signed)
Office Visit Note   Patient: Robert Huang           Date of Birth: 10-11-1960           MRN: [id number] Visit Date: 12/26/2020              Requested by: [name], MD [address] [place],  [place] [address] PCP: [name], MD  Chief Complaint  Patient presents with  . Left Foot - Routine Post Op    12/10/20 left transmet amputation       HPI: Patient is 2 weeks status post left transmetatarsal amputation.  He is overall doing well.  He has been trying for the most part not to put weight on his foot  Assessment & Plan: Visit Diagnoses: No diagnosis found.  Plan: Continue with daily cleansing applying a dry dressing.  Follow-up in 1 week.  Follow-Up Instructions: No follow-ups on file.   Ortho Exam  Patient is alert, oriented, no adenopathy, well-dressed, normal affect, normal respiratory effort. Examination demonstrates some mild erythema but no cellulitis directly around the sutures.  Well apposed wound edges swelling is well controlled no ascending cellulitis  Imaging: No results found. No images are attached to the encounter.  Labs: Lab Results  Component Value Date   HGBA1C 6.6 (H) 12/08/2020   HGBA1C 7.5 (H) 09/28/2020   HGBA1C 7.6 (A) 10/19/2019   ESRSEDRATE 50 (H) 03/02/2019   ESRSEDRATE 63 (H) 07/17/2012   CRP 2.9 (H) 03/02/2019   CRP 1.3 (H) 07/17/2012   LABURIC 7.2 12/03/2017   LABURIC 7.6 01/11/2017   LABURIC 12.7 (H) 11/05/2014   REPTSTATUS 12/08/2020 FINAL 12/06/2020   GRAMSTAIN  12/06/2020    FEW WBC PRESENT, PREDOMINANTLY PMN FEW GRAM POSITIVE COCCI IN PAIRS IN CLUSTERS    CULT  12/06/2020    FEW STREPTOCOCCUS AGALACTIAE TESTING AGAINST S. AGALACTIAE NOT ROUTINELY PERFORMED DUE TO PREDICTABILITY OF AMP/PEN/VAN SUSCEPTIBILITY. WITHIN MIXED ORGANISMS Performed at [facility], [address], [place], [place] [address]    LABORGA METHICILLIN RESISTANT STAPHYLOCOCCUS AUREUS 10/13/2020   LABORGA ENTEROCOCCUS  FAECALIS 10/13/2020     Lab Results  Component Value Date   ALBUMIN 3.0 (L) 12/08/2020   ALBUMIN 3.9 09/28/2020   ALBUMIN 3.5 03/07/2019    Lab Results  Component Value Date   MG 1.5 (L) 12/10/2020   MG 1.9 03/07/2019   MG 1.6 (L) 03/06/2019   No results found for: VD25OH  No results found for: PREALBUMIN CBC EXTENDED Latest Ref Rng & Units 12/11/2020 12/10/2020 12/09/2020  WBC 4.0 - 10.5 K/uL 7.5 4.6 5.3  RBC 4.22 - 5.81 MIL/uL 3.44(L) 3.27(L) 3.10(L)  HGB 13.0 - 17.0 g/dL 10.0(L) 9.7(L) 9.3(L)  HCT 39.0 - 52.0 % 33.1(L) 31.1(L) 29.3(L)  PLT 150 - 400 K/uL 333 299 283  NEUTROABS 1.7 - 7.7 K/uL - - -  LYMPHSABS 0.7 - 4.0 K/uL - - -     There is no height or weight on file to calculate BMI.  Orders:  No orders of the defined types were placed in this encounter.  No orders of the defined types were placed in this encounter.    Procedures: No procedures performed  Clinical Data: No additional findings.  ROS:  All other systems negative, except as noted in the HPI. Review of Systems  Objective: Vital Signs: There were no vitals taken for this visit.  Specialty Comments:  No specialty comments available.  PMFS History: Patient Active Problem List  Diagnosis Date Noted  . Iron deficiency anemia 12/19/2020  . Cellulitis of left foot   . History of complete ray amputation of fifth toe of left foot (HCC)   . Status post amputation of right great toe (HCC)   . Diabetic foot infection (HCC) 12/08/2020  . Diabetic foot ulcers (HCC) 03/02/2019  . Osteomyelitis of fourth toe of left foot (HCC) 03/02/2019  . Hypokalemia 03/02/2019  . Chronic right shoulder pain 12/02/2018  . Diabetes mellitus with complication (HCC) 12/03/2017  . Right hip pain 12/27/2015  . MRSA (methicillin resistant staph aureus) culture positive 12/31/2012  . Open displaced fracture of right great toe 05/20/2012  . Anxiety state 12/25/2008  . Coronary atherosclerosis 12/25/2008  . CHEST  PAIN 12/25/2008  . Hypothyroidism 05/27/2007  . Dyslipidemia 05/27/2007  . Gout 05/27/2007  . Essential hypertension 05/27/2007  . Osteoarthrosis, unspecified whether generalized or localized, unspecified site 05/27/2007  . RENAL CALCULUS, HX OF 05/27/2007   Past Medical History:  Diagnosis Date  . Anxiety   . CAD (coronary artery disease)    sees Dr. [name], normal Stress test 07-16-12   . Diabetes mellitus   . DJD (degenerative joint disease)   . Gout   . Hyperlipidemia   . Hypertension   . Hypothyroid   . Osteoarthritis   . Renal calculus    hx  . Umbilical hernia     Family History  Problem Relation Age of Onset  . Heart attack Mother   . Arthritis Other   . Coronary artery disease Other   . Diabetes Other   . Hypertension Other   . Prostate cancer Other   . Stroke Other     Past Surgical History:  Procedure Laterality Date  . AMPUTATION  07/17/2012   Procedure: AMPUTATION RAY;  Surgeon: [name], MD;  Location: [facility] OR;  Service: Orthopedics;  Laterality: Right;  right hallux amputation (1st ray resection )  . AMPUTATION Left 03/06/2019   Procedure: Ray Amputation left foot;  Surgeon: [name], MD;  Location: [facility] ORS;  Service: Orthopedics;  Laterality: Left;  . CORONARY ANGIOPLASTY WITH STENT PLACEMENT    . FINGER SURGERY     left index finger  . I & D EXTREMITY  05/19/2012   Procedure: IRRIGATION AND DEBRIDEMENT EXTREMITY;  Surgeon: [name], MD;  Location: [facility] OR;  Service: Orthopedics;  Laterality: Right;  Right Great toe  . I & D Toe  05/19/2012   rt great toe  . TONSILLECTOMY    . TRANSMETATARSAL AMPUTATION Left 12/10/2020   Procedure: TRANSMETATARSAL AMPUTATION LEFT;  Surgeon: [name], MD;  Location: [facility] OR;  Service: Orthopedics;  Laterality: Left;   Social History   Occupational History  . Not on file  Tobacco Use  . Smoking status: Never Smoker  . Smokeless tobacco: Current User    Types: Chew  . Tobacco comment: occ   Substance and Sexual Activity  . Alcohol use: Yes    Alcohol/week: 0.0 standard drinks    Comment: weekends  . Drug use: No  . Sexual activity: Yes

## 2020-12-26 NOTE — Telephone Encounter (Signed)
Please resend new script with instructions.

## 2020-12-26 NOTE — Telephone Encounter (Signed)
Robert Huang is calling from [facility] to get someone to verify the pts prescription for test strips and will be needing a new prescription with new instructions stating that the pt needs to test 4 times a day.

## 2020-12-27 NOTE — Telephone Encounter (Signed)
Please send in more test strips to test QID. He has labile sugars and requires frequent testing

## 2020-12-29 ENCOUNTER — Other Ambulatory Visit: Payer: Self-pay | Admitting: Family Medicine

## 2020-12-30 ENCOUNTER — Other Ambulatory Visit: Payer: Self-pay

## 2020-12-30 ENCOUNTER — Telehealth: Payer: Self-pay | Admitting: Family Medicine

## 2020-12-30 MED ORDER — ONETOUCH ULTRA VI STRP: 1.0000 | ORAL_STRIP | Freq: Four times a day (QID) | 1 refills | Status: DC

## 2020-12-30 NOTE — Telephone Encounter (Signed)
Refill for test strip sent to pt pharmacy

## 2020-12-30 NOTE — Telephone Encounter (Signed)
Patient dropped off forms for [employer] shoes  Fax to: [phone]  ATTN: Dr. [facility]  Disposition: Dr's folder

## 2021-01-02 ENCOUNTER — Encounter: Payer: Self-pay | Admitting: Family Medicine

## 2021-01-02 ENCOUNTER — Ambulatory Visit (INDEPENDENT_AMBULATORY_CARE_PROVIDER_SITE_OTHER): Payer: 59 | Admitting: Family Medicine

## 2021-01-02 ENCOUNTER — Ambulatory Visit (INDEPENDENT_AMBULATORY_CARE_PROVIDER_SITE_OTHER): Payer: 59 | Admitting: Orthopedic Surgery

## 2021-01-02 ENCOUNTER — Other Ambulatory Visit: Payer: Self-pay

## 2021-01-02 ENCOUNTER — Encounter: Payer: Self-pay | Admitting: Orthopedic Surgery

## 2021-01-02 VITALS — BP 142/70 | HR 64 | Temp 98.4°F | Wt 209.6 lb

## 2021-01-02 DIAGNOSIS — L089 Local infection of the skin and subcutaneous tissue, unspecified: Secondary | ICD-10-CM

## 2021-01-02 DIAGNOSIS — L97528 Non-pressure chronic ulcer of other part of left foot with other specified severity: Secondary | ICD-10-CM

## 2021-01-02 DIAGNOSIS — E11621 Type 2 diabetes mellitus with foot ulcer: Secondary | ICD-10-CM

## 2021-01-02 DIAGNOSIS — F1190 Opioid use, unspecified, uncomplicated: Secondary | ICD-10-CM

## 2021-01-02 DIAGNOSIS — R69 Illness, unspecified: Secondary | ICD-10-CM | POA: Diagnosis not present

## 2021-01-02 DIAGNOSIS — Z89432 Acquired absence of left foot: Secondary | ICD-10-CM

## 2021-01-02 MED ORDER — HYDROCODONE-ACETAMINOPHEN 10-325 MG PO TABS: 1.0000 | ORAL_TABLET | Freq: Four times a day (QID) | ORAL | 0 refills | Status: DC | PRN

## 2021-01-02 MED ORDER — "SYRINGE 25G X 1"" 3 ML MISC": 1.0000 "application " | 5 refills | Status: AC

## 2021-01-02 MED ORDER — DOXYCYCLINE HYCLATE 100 MG PO TABS: 100.0000 mg | ORAL_TABLET | Freq: Two times a day (BID) | ORAL | 0 refills | Status: DC

## 2021-01-02 MED ORDER — HYDROCODONE-ACETAMINOPHEN 10-325 MG PO TABS: 1.0000 | ORAL_TABLET | Freq: Four times a day (QID) | ORAL | 0 refills | Status: AC | PRN

## 2021-01-02 NOTE — Telephone Encounter (Signed)
We will do this together at his OV today

## 2021-01-02 NOTE — Progress Notes (Signed)
   Subjective:    Patient ID: Robert Huang [id number], male    DOB: [birth year], 60 y.o.   MRN: [id number]  HPI Here for pain management. He is healing well from his recent partial foot amputation. He saw Dr. Lajoyce Corners this morning. He had been using Oxycodone for pain, but this is too strong. He wants to go back on hydrocodone.  Indication for chronic opioid: foot pain Medication and dose: Norco 10-325 # pills per month: 120 Last UDS date: 01-02-21 Opioid Treatment Agreement signed (Y/N): 12-10-18 Opioid Treatment Agreement last reviewed with patient:  01-02-21 NCCSRS reviewed this encounter (include red flags): Yes   Review of Systems  Constitutional: Negative.        Objective:   Physical Exam        Assessment & Plan:  Pain management, see HPI above for details. We will write for 3 months of medications. Get a UDS today. Gershon Crane, MD

## 2021-01-02 NOTE — Progress Notes (Signed)
Office Visit Note   Patient: Robert Huang           Date of Birth: 1961/05/30           MRN: [id number] Visit Date: 01/02/2021              Requested by: [name], MD [address] [place],  [place] [address] PCP: [name], MD  Chief Complaint  Patient presents with  . Left Foot - Routine Post Op    12/10/20 left transmet amputation       HPI: Patient is a 60 year old gentleman who presents 3 weeks status post left transmetatarsal amputation he has been nonweightbearing with dry dressing changes daily he states he has some pain and a small amount of clear serosanguineous drainage.  Assessment & Plan: Visit Diagnoses:  1. History of transmetatarsal amputation of left foot (HCC)   2. Left foot infection     Plan: A prescription was called in for doxycycline he does have Bactroban and will use a small amount of Bactroban over the open wound.  Continue with compression wraps continue with elevation.  Patient will continue with his protein shakes twice a day  Follow-Up Instructions: Return in about 2 weeks (around 01/16/2021).   Ortho Exam  Patient is alert, oriented, no adenopathy, well-dressed, normal affect, normal respiratory effort. Examination patient has a strong dorsalis pedis and posterior tibial pulse he does have pitting edema and swelling of the left transmetatarsal amputation there is dermatitis dorsally which does resolve with compression but returns.  This is nontender to palpation.  There is an area of granulation tissue about 5 mm in diameter with no purulent drainage.  There is some mild maceration around the open wound from the drainage.  Imaging: No results found. No images are attached to the encounter.  Labs: Lab Results  Component Value Date   HGBA1C 6.6 (H) 12/08/2020   HGBA1C 7.5 (H) 09/28/2020   HGBA1C 7.6 (A) 10/19/2019   ESRSEDRATE 50 (H) 03/02/2019   ESRSEDRATE 63 (H) 07/17/2012   CRP 2.9 (H) 03/02/2019   CRP 1.3 (H)  07/17/2012   LABURIC 7.2 12/03/2017   LABURIC 7.6 01/11/2017   LABURIC 12.7 (H) 11/05/2014   REPTSTATUS 12/08/2020 FINAL 12/06/2020   GRAMSTAIN  12/06/2020    FEW WBC PRESENT, PREDOMINANTLY PMN FEW GRAM POSITIVE COCCI IN PAIRS IN CLUSTERS    CULT  12/06/2020    FEW STREPTOCOCCUS AGALACTIAE TESTING AGAINST S. AGALACTIAE NOT ROUTINELY PERFORMED DUE TO PREDICTABILITY OF AMP/PEN/VAN SUSCEPTIBILITY. WITHIN MIXED ORGANISMS Performed at [facility], [address] [place], [place] [address]    LABORGA METHICILLIN RESISTANT STAPHYLOCOCCUS AUREUS 10/13/2020   LABORGA ENTEROCOCCUS FAECALIS 10/13/2020     Lab Results  Component Value Date   ALBUMIN 3.0 (L) 12/08/2020   ALBUMIN 3.9 09/28/2020   ALBUMIN 3.5 03/07/2019    Lab Results  Component Value Date   MG 1.5 (L) 12/10/2020   MG 1.9 03/07/2019   MG 1.6 (L) 03/06/2019   No results found for: VD25OH  No results found for: PREALBUMIN CBC EXTENDED Latest Ref Rng & Units 12/11/2020 12/10/2020 12/09/2020  WBC 4.0 - 10.5 K/uL 7.5 4.6 5.3  RBC 4.22 - 5.81 MIL/uL 3.44(L) 3.27(L) 3.10(L)  HGB 13.0 - 17.0 g/dL 10.0(L) 9.7(L) 9.3(L)  HCT 39.0 - 52.0 % 33.1(L) 31.1(L) 29.3(L)  PLT 150 - 400 K/uL 333 299 283  NEUTROABS 1.7 - 7.7 K/uL - - -  LYMPHSABS 0.7 - 4.0 K/uL - - -  There is no height or weight on file to calculate BMI.  Orders:  No orders of the defined types were placed in this encounter.  No orders of the defined types were placed in this encounter.    Procedures: No procedures performed  Clinical Data: No additional findings.  ROS:  All other systems negative, except as noted in the HPI. Review of Systems  Objective: Vital Signs: There were no vitals taken for this visit.  Specialty Comments:  No specialty comments available.  PMFS History: Patient Active Problem List   Diagnosis Date Noted  . Iron deficiency anemia 12/19/2020  . Cellulitis of left foot   . History of complete ray amputation of  fifth toe of left foot (HCC)   . Status post amputation of right great toe (HCC)   . Diabetic foot infection (HCC) 12/08/2020  . Diabetic foot ulcers (HCC) 03/02/2019  . Osteomyelitis of fourth toe of left foot (HCC) 03/02/2019  . Hypokalemia 03/02/2019  . Chronic right shoulder pain 12/02/2018  . Diabetes mellitus with complication (HCC) 12/03/2017  . Right hip pain 12/27/2015  . MRSA (methicillin resistant staph aureus) culture positive 12/31/2012  . Open displaced fracture of right great toe 05/20/2012  . Anxiety state 12/25/2008  . Coronary atherosclerosis 12/25/2008  . CHEST PAIN 12/25/2008  . Hypothyroidism 05/27/2007  . Dyslipidemia 05/27/2007  . Gout 05/27/2007  . Essential hypertension 05/27/2007  . Osteoarthrosis, unspecified whether generalized or localized, unspecified site 05/27/2007  . RENAL CALCULUS, HX OF 05/27/2007   Past Medical History:  Diagnosis Date  . Anxiety   . CAD (coronary artery disease)    sees Dr. [name], normal Stress test 07-16-12   . Diabetes mellitus   . DJD (degenerative joint disease)   . Gout   . Hyperlipidemia   . Hypertension   . Hypothyroid   . Osteoarthritis   . Renal calculus    hx  . Umbilical hernia     Family History  Problem Relation Age of Onset  . Heart attack Mother   . Arthritis Other   . Coronary artery disease Other   . Diabetes Other   . Hypertension Other   . Prostate cancer Other   . Stroke Other     Past Surgical History:  Procedure Laterality Date  . AMPUTATION  07/17/2012   Procedure: AMPUTATION RAY;  Surgeon: [name], MD;  Location: [facility] OR;  Service: Orthopedics;  Laterality: Right;  right hallux amputation (1st ray resection )  . AMPUTATION Left 03/06/2019   Procedure: Ray Amputation left foot;  Surgeon: [name], MD;  Location: [facility] ORS;  Service: Orthopedics;  Laterality: Left;  . CORONARY ANGIOPLASTY WITH STENT PLACEMENT    . FINGER SURGERY     left index finger  . I & D EXTREMITY   05/19/2012   Procedure: IRRIGATION AND DEBRIDEMENT EXTREMITY;  Surgeon: [name], MD;  Location: [facility] OR;  Service: Orthopedics;  Laterality: Right;  Right Great toe  . I & D Toe  05/19/2012   rt great toe  . TONSILLECTOMY    . TRANSMETATARSAL AMPUTATION Left 12/10/2020   Procedure: TRANSMETATARSAL AMPUTATION LEFT;  Surgeon: [name], MD;  Location: [facility] OR;  Service: Orthopedics;  Laterality: Left;   Social History   Occupational History  . Not on file  Tobacco Use  . Smoking status: Never Smoker  . Smokeless tobacco: Current User    Types: Chew  . Tobacco comment: occ  Substance and Sexual Activity  .  Alcohol use: Yes    Alcohol/week: 0.0 standard drinks    Comment: weekends  . Drug use: No  . Sexual activity: Yes

## 2021-01-05 LAB — DRUG MONITOR, PANEL 1, W/CONF, URINE
Alphahydroxyalprazolam: 25 ng/mL — ABNORMAL HIGH (ref <25)
Alphahydroxymidazolam: NEGATIVE ng/mL (ref <50)
Alphahydroxytriazolam: NEGATIVE ng/mL (ref <50)
Aminoclonazepam: NEGATIVE ng/mL (ref <25)
Amphetamines: NEGATIVE ng/mL (ref <500)
Barbiturates: NEGATIVE ng/mL (ref <300)
Benzodiazepines: POSITIVE ng/mL — AB (ref <100)
Cocaine Metabolite: NEGATIVE ng/mL (ref <150)
Codeine: NEGATIVE ng/mL (ref <50)
Creatinine: 28.2 mg/dL
Hydrocodone: 320 ng/mL — ABNORMAL HIGH (ref <50)
Hydromorphone: 173 ng/mL — ABNORMAL HIGH (ref <50)
Hydroxyethylflurazepam: NEGATIVE ng/mL (ref <50)
Lorazepam: NEGATIVE ng/mL (ref <50)
Marijuana Metabolite: NEGATIVE ng/mL (ref <20)
Methadone Metabolite: NEGATIVE ng/mL (ref <100)
Morphine: NEGATIVE ng/mL (ref <50)
Nordiazepam: NEGATIVE ng/mL (ref <50)
Norhydrocodone: 510 ng/mL — ABNORMAL HIGH (ref <50)
Noroxycodone: 392 ng/mL — ABNORMAL HIGH (ref <50)
Opiates: POSITIVE ng/mL — AB (ref <100)
Oxazepam: NEGATIVE ng/mL (ref <50)
Oxidant: NEGATIVE ug/mL
Oxycodone: 114 ng/mL — ABNORMAL HIGH (ref <50)
Oxycodone: POSITIVE ng/mL — AB (ref <100)
Oxymorphone: 199 ng/mL — ABNORMAL HIGH (ref <50)
Phencyclidine: NEGATIVE ng/mL (ref <25)
Temazepam: NEGATIVE ng/mL (ref <50)
pH: 6 (ref 4.5–9.0)

## 2021-01-05 LAB — DM TEMPLATE

## 2021-01-16 ENCOUNTER — Other Ambulatory Visit: Payer: Self-pay | Admitting: Family Medicine

## 2021-01-16 ENCOUNTER — Ambulatory Visit (INDEPENDENT_AMBULATORY_CARE_PROVIDER_SITE_OTHER): Payer: 59 | Admitting: Physician Assistant

## 2021-01-16 ENCOUNTER — Encounter: Payer: Self-pay | Admitting: Physician Assistant

## 2021-01-16 DIAGNOSIS — Z89432 Acquired absence of left foot: Secondary | ICD-10-CM

## 2021-01-16 NOTE — Progress Notes (Signed)
Office Visit Note   Patient: Robert Huang [id number]           Date of Birth: 1961-02-11           MRN: [id number] Visit Date: 01/16/2021              Requested by: [name], MD [place],  [place] [address] PCP: [name], MD  Chief Complaint  Patient presents with  . Left Foot - Follow-up      HPI: Today 5 weeks status post left transmetatarsal amputation.  He completed some antibiotics and feels he is doing very well.  He has met with prosthetic company for a appropriate orthotic.  He changes the dressing about once every other day  Assessment & Plan: Visit Diagnoses: No diagnosis found.  Plan: We will follow-up in 4 weeks.  We discussed return to work and I would expect this to be in about 6 weeks.  He may call and tell us a specific date.  Continue to do daily dressing changes if possible.  Follow-Up Instructions: No follow-ups on file.   Ortho Exam  Patient is alert, oriented, no adenopathy, well-dressed, normal affect, normal respiratory effort. Examination overall well apposed wound edges she does he does have 2 small areas in the front part of the metatarsal amputation stump that are in the final phases of healing.  There is some skin maceration in this area but nothing probes deeply or tunnels.  No foul odor no surrounding or ascending cellulitis  Imaging: No results found. No images are attached to the encounter.  Labs: Lab Results  Component Value Date   HGBA1C 6.6 (H) 12/08/2020   HGBA1C 7.5 (H) 09/28/2020   HGBA1C 7.6 (A) 10/19/2019   ESRSEDRATE 50 (H) 03/02/2019   ESRSEDRATE 63 (H) 07/17/2012   CRP 2.9 (H) 03/02/2019   CRP 1.3 (H) 07/17/2012   LABURIC 7.2 12/03/2017   LABURIC 7.6 01/11/2017   LABURIC 12.7 (H) 11/05/2014   REPTSTATUS 12/08/2020 FINAL 12/06/2020   GRAMSTAIN  12/06/2020    FEW WBC PRESENT, PREDOMINANTLY PMN FEW GRAM POSITIVE COCCI IN PAIRS IN CLUSTERS    CULT  12/06/2020    FEW STREPTOCOCCUS  AGALACTIAE TESTING AGAINST S. AGALACTIAE NOT ROUTINELY PERFORMED DUE TO PREDICTABILITY OF AMP/PEN/VAN SUSCEPTIBILITY. WITHIN MIXED ORGANISMS Performed at [facility], [place] [address] [place], [place] [address]    LABORGA METHICILLIN RESISTANT STAPHYLOCOCCUS AUREUS 10/13/2020   LABORGA ENTEROCOCCUS FAECALIS 10/13/2020     Lab Results  Component Value Date   ALBUMIN 3.0 (L) 12/08/2020   ALBUMIN 3.9 09/28/2020   ALBUMIN 3.5 03/07/2019    Lab Results  Component Value Date   MG 1.5 (L) 12/10/2020   MG 1.9 03/07/2019   MG 1.6 (L) 03/06/2019   No results found for: VD25OH  No results found for: PREALBUMIN CBC EXTENDED Latest Ref Rng & Units 12/11/2020 12/10/2020 12/09/2020  WBC 4.0 - 10.5 K/uL 7.5 4.6 5.3  RBC 4.22 - 5.81 MIL/uL 3.44(L) 3.27(L) 3.10(L)  HGB 13.0 - 17.0 g/dL 10.0(L) 9.7(L) 9.3(L)  HCT 39.0 - 52.0 % 33.1(L) 31.1(L) 29.3(L)  PLT 150 - 400 K/uL 333 299 283  NEUTROABS 1.7 - 7.7 K/uL - - -  LYMPHSABS 0.7 - 4.0 K/uL - - -     There is no height or weight on file to calculate BMI.  Orders:  No orders of the defined types were placed in this encounter.  No orders of the defined types were  placed in this encounter.    Procedures: No procedures performed  Clinical Data: No additional findings.  ROS:  All other systems negative, except as noted in the HPI. Review of Systems  Objective: Vital Signs: There were no vitals taken for this visit.  Specialty Comments:  No specialty comments available.  PMFS History: Patient Active Problem List   Diagnosis Date Noted  . Iron deficiency anemia 12/19/2020  . Cellulitis of left foot   . History of complete ray amputation of fifth toe of left foot ([place])   . Status post amputation of right great toe ([place])   . Diabetic foot infection ([place]) 12/08/2020  . Diabetic foot ulcers ([place]) 03/02/2019  . Osteomyelitis of fourth toe of left foot ([place]) 03/02/2019  . Hypokalemia 03/02/2019  . Chronic right shoulder pain  12/02/2018  . Diabetes mellitus with complication ([place]) 76/28/3151  . Right hip pain 12/27/2015  . MRSA (methicillin resistant staph aureus) culture positive 12/31/2012  . Open displaced fracture of right great toe 05/20/2012  . Anxiety state 12/25/2008  . Coronary atherosclerosis 12/25/2008  . CHEST PAIN 12/25/2008  . Hypothyroidism 05/27/2007  . Dyslipidemia 05/27/2007  . Gout 05/27/2007  . Essential hypertension 05/27/2007  . Osteoarthrosis, unspecified whether generalized or localized, unspecified site 05/27/2007  . RENAL CALCULUS, HX OF 05/27/2007   Past Medical History:  Diagnosis Date  . Anxiety   . CAD (coronary artery disease)    sees Dr. Stanford Breed, normal Stress test 07-16-12   . Diabetes mellitus   . DJD (degenerative joint disease)   . Gout   . Hyperlipidemia   . Hypertension   . Hypothyroid   . Osteoarthritis   . Renal calculus    hx  . Umbilical hernia     Family History  Problem Relation Age of Onset  . Heart attack Mother   . Arthritis Other   . Coronary artery disease Other   . Diabetes Other   . Hypertension Other   . Prostate cancer Other   . Stroke Other     Past Surgical History:  Procedure Laterality Date  . AMPUTATION  07/17/2012   Procedure: AMPUTATION RAY;  Surgeon: Wylene Simmer, MD;  Location: [facility];  Service: Orthopedics;  Laterality: Right;  right hallux amputation (1st ray resection )  . AMPUTATION Left 03/06/2019   Procedure: Ray Amputation left foot;  Surgeon: Nicholes Stairs, MD;  Location: [facility];  Service: Orthopedics;  Laterality: Left;  . CORONARY ANGIOPLASTY WITH STENT PLACEMENT    . FINGER SURGERY     left index finger  . I & D EXTREMITY  05/19/2012   Procedure: IRRIGATION AND DEBRIDEMENT EXTREMITY;  Surgeon: Marin Shutter, MD;  Location: [facility];  Service: Orthopedics;  Laterality: Right;  Right Great toe  . I & D Toe  05/19/2012   rt great toe  . TONSILLECTOMY    . TRANSMETATARSAL AMPUTATION Left 12/10/2020   Procedure:  TRANSMETATARSAL AMPUTATION LEFT;  Surgeon: Newt Minion, MD;  Location: [facility];  Service: Orthopedics;  Laterality: Left;   Social History   Occupational History  . Not on file  Tobacco Use  . Smoking status: Never Smoker  . Smokeless tobacco: Current User    Types: Chew  . Tobacco comment: occ  Substance and Sexual Activity  . Alcohol use: Yes    Alcohol/week: 0.0 standard drinks    Comment: weekends  . Drug use: No  . Sexual activity: Yes

## 2021-01-28 ENCOUNTER — Other Ambulatory Visit: Payer: Self-pay | Admitting: Family Medicine

## 2021-01-30 NOTE — Telephone Encounter (Signed)
Last refill- 07/20/20--90 tabs with 1 refill Last office visit- 01/02/21  No future visit scheduled

## 2021-02-02 ENCOUNTER — Other Ambulatory Visit: Payer: Self-pay | Admitting: Family Medicine

## 2021-02-13 ENCOUNTER — Encounter: Payer: Self-pay | Admitting: Orthopedic Surgery

## 2021-02-13 ENCOUNTER — Ambulatory Visit (INDEPENDENT_AMBULATORY_CARE_PROVIDER_SITE_OTHER): Payer: 59 | Admitting: Physician Assistant

## 2021-02-13 DIAGNOSIS — Z89432 Acquired absence of left foot: Secondary | ICD-10-CM

## 2021-02-13 NOTE — Progress Notes (Signed)
Office Visit Note   Patient: Robert Huang           Date of Birth: 06-Jul-1961           MRN: [id number] Visit Date: 02/13/2021              Requested by: [name], MD [address] [place],  [place] [address] PCP: [name], MD  Chief Complaint  Patient presents with   Left Foot - Routine Post Op    12/10/20 left transmet amputation       HPI: Patient is a pleasant 60 year old gentleman who is 2-1/2 months status post left transmetatarsal amputation.  He is doing well.  He still is having difficulty with endurance but he feels like he is improving every day.  Assessment & Plan: Visit Diagnoses: No diagnosis found.  Plan: Patient may follow-up as needed.  He may go back to his job normal duties on July 15.  He is going to see [employer] next week to receive his extra-depth shoes and filler and carbon fiber plate  Follow-Up Instructions: No follow-ups on file.   Ortho Exam  Patient is alert, oriented, no adenopathy, well-dressed, normal affect, normal respiratory effort. Examination left transmetatarsal amputation stump is healed there is just 2 small areas of thin eschar.  Some of this was debrided to healthy skin.  No swelling no cellulitis no signs of infection.  Imaging: No results found. No images are attached to the encounter.  Labs: Lab Results  Component Value Date   HGBA1C 6.6 (H) 12/08/2020   HGBA1C 7.5 (H) 09/28/2020   HGBA1C 7.6 (A) 10/19/2019   ESRSEDRATE 50 (H) 03/02/2019   ESRSEDRATE 63 (H) 07/17/2012   CRP 2.9 (H) 03/02/2019   CRP 1.3 (H) 07/17/2012   LABURIC 7.2 12/03/2017   LABURIC 7.6 01/11/2017   LABURIC 12.7 (H) 11/05/2014   REPTSTATUS 12/08/2020 FINAL 12/06/2020   GRAMSTAIN  12/06/2020    FEW WBC PRESENT, PREDOMINANTLY PMN FEW GRAM POSITIVE COCCI IN PAIRS IN CLUSTERS    CULT  12/06/2020    FEW STREPTOCOCCUS AGALACTIAE TESTING AGAINST S. AGALACTIAE NOT ROUTINELY PERFORMED DUE TO PREDICTABILITY OF AMP/PEN/VAN  SUSCEPTIBILITY. WITHIN MIXED ORGANISMS Performed at [facility], [address], [place], [place] [address]    LABORGA METHICILLIN RESISTANT STAPHYLOCOCCUS AUREUS 10/13/2020   LABORGA ENTEROCOCCUS FAECALIS 10/13/2020     Lab Results  Component Value Date   ALBUMIN 3.0 (L) 12/08/2020   ALBUMIN 3.9 09/28/2020   ALBUMIN 3.5 03/07/2019    Lab Results  Component Value Date   MG 1.5 (L) 12/10/2020   MG 1.9 03/07/2019   MG 1.6 (L) 03/06/2019   No results found for: VD25OH  No results found for: PREALBUMIN CBC EXTENDED Latest Ref Rng & Units 12/11/2020 12/10/2020 12/09/2020  WBC 4.0 - 10.5 K/uL 7.5 4.6 5.3  RBC 4.22 - 5.81 MIL/uL 3.44(L) 3.27(L) 3.10(L)  HGB 13.0 - 17.0 g/dL 10.0(L) 9.7(L) 9.3(L)  HCT 39.0 - 52.0 % 33.1(L) 31.1(L) 29.3(L)  PLT 150 - 400 K/uL 333 299 283  NEUTROABS 1.7 - 7.7 K/uL - - -  LYMPHSABS 0.7 - 4.0 K/uL - - -     There is no height or weight on file to calculate BMI.  Orders:  No orders of the defined types were placed in this encounter.  No orders of the defined types were placed in this encounter.    Procedures: No procedures performed  Clinical Data: No additional findings.  ROS:  All other systems negative, except as noted in the HPI. Review of Systems  Objective: Vital Signs: There were no vitals taken for this visit.  Specialty Comments:  No specialty comments available.  PMFS History: Patient Active Problem List   Diagnosis Date Noted   Iron deficiency anemia 12/19/2020   Cellulitis of left foot    History of complete ray amputation of fifth toe of left foot ([facility])    Status post amputation of right great toe ([facility])    Diabetic foot infection ([facility]) 12/08/2020   Diabetic foot ulcers ([facility]) 03/02/2019   Osteomyelitis of fourth toe of left foot ([facility]) 03/02/2019   Hypokalemia 03/02/2019   Chronic right shoulder pain 12/02/2018   Diabetes mellitus with complication ([facility]) 12/03/2017   Right hip pain 12/27/2015   MRSA  (methicillin resistant staph aureus) culture positive 12/31/2012   Open displaced fracture of right great toe 05/20/2012   Anxiety state 12/25/2008   Coronary atherosclerosis 12/25/2008   CHEST PAIN 12/25/2008   Hypothyroidism 05/27/2007   Dyslipidemia 05/27/2007   Gout 05/27/2007   Essential hypertension 05/27/2007   Osteoarthrosis, unspecified whether generalized or localized, unspecified site 05/27/2007   RENAL CALCULUS, HX OF 05/27/2007   Past Medical History:  Diagnosis Date   Anxiety    CAD (coronary artery disease)    sees Dr. [name], normal Stress test 07-16-12    Diabetes mellitus    DJD (degenerative joint disease)    Gout    Hyperlipidemia    Hypertension    Hypothyroid    Osteoarthritis    Renal calculus    hx   Umbilical hernia     Family History  Problem Relation Age of Onset   Heart attack Mother    Arthritis Other    Coronary artery disease Other    Diabetes Other    Hypertension Other    Prostate cancer Other    Stroke Other     Past Surgical History:  Procedure Laterality Date   AMPUTATION  07/17/2012   Procedure: AMPUTATION RAY;  Surgeon: [name], MD;  [facility];  Service: Orthopedics;  Laterality: Right;  right hallux amputation (1st ray resection )   AMPUTATION Left 03/06/2019   Procedure: Ray Amputation left foot;  Surgeon: [name], MD;  Location[facility];  Service: Orthopedics;  Laterality: Left;   CORONARY ANGIOPLASTY WITH STENT PLACEMENT     FINGER SURGERY     left index finger   I & D EXTREMITY  05/19/2012   Procedure: IRRIGATION AND DEBRIDEMENT EXTREMITY;  Surgeon: [name], MD;  Location[facility];  Service: Orthopedics;  Laterality: Right;  Right Great toe   I & D Toe  05/19/2012   rt great toe   TONSILLECTOMY     TRANSMETATARSAL AMPUTATION Left 12/10/2020   Procedure: TRANSMETATARSAL AMPUTATION LEFT;  Surgeon: [name], MD;  Location: [facility];  Service: Orthopedics;  Laterality: Left;   Social History    Occupational History   Not on file  Tobacco Use   Smoking status: Never   Smokeless tobacco: Current    Types: Chew   Tobacco comments:    occ  Substance and Sexual Activity   Alcohol use: Yes    Alcohol/week: 0.0 standard drinks    Comment: weekends   Drug use: No   Sexual activity: Yes

## 2021-02-14 ENCOUNTER — Telehealth: Payer: Self-pay | Admitting: Family Medicine

## 2021-02-14 NOTE — Telephone Encounter (Signed)
Robert Huang is calling in to see if we received a for from [facility] foe the pt to get diabetic shoes.  They would like to have a call back.

## 2021-02-14 NOTE — Telephone Encounter (Signed)
Please advise 

## 2021-02-15 NOTE — Telephone Encounter (Signed)
Pt was completed by Dr Clent Ridges and faxed to [facility], copy sent to scanning

## 2021-02-15 NOTE — Telephone Encounter (Signed)
Have you seen this form?

## 2021-02-21 DIAGNOSIS — Z89411 Acquired absence of right great toe: Secondary | ICD-10-CM | POA: Diagnosis not present

## 2021-02-21 DIAGNOSIS — Z89412 Acquired absence of left great toe: Secondary | ICD-10-CM | POA: Diagnosis not present

## 2021-02-21 DIAGNOSIS — Z89422 Acquired absence of other left toe(s): Secondary | ICD-10-CM | POA: Diagnosis not present

## 2021-02-21 DIAGNOSIS — E1142 Type 2 diabetes mellitus with diabetic polyneuropathy: Secondary | ICD-10-CM | POA: Diagnosis not present

## 2021-02-26 ENCOUNTER — Other Ambulatory Visit: Payer: Self-pay | Admitting: Family Medicine

## 2021-03-02 ENCOUNTER — Encounter: Payer: Self-pay | Admitting: Family Medicine

## 2021-03-03 ENCOUNTER — Other Ambulatory Visit: Payer: Self-pay | Admitting: Family Medicine

## 2021-03-09 ENCOUNTER — Other Ambulatory Visit: Payer: Self-pay

## 2021-03-10 ENCOUNTER — Ambulatory Visit (INDEPENDENT_AMBULATORY_CARE_PROVIDER_SITE_OTHER): Payer: 59 | Admitting: Family Medicine

## 2021-03-10 ENCOUNTER — Encounter: Payer: Self-pay | Admitting: Family Medicine

## 2021-03-10 VITALS — BP 120/60 | HR 58 | Temp 98.8°F | Wt 200.8 lb

## 2021-03-10 DIAGNOSIS — Z89411 Acquired absence of right great toe: Secondary | ICD-10-CM

## 2021-03-10 DIAGNOSIS — L03116 Cellulitis of left lower limb: Secondary | ICD-10-CM | POA: Diagnosis not present

## 2021-03-10 DIAGNOSIS — M869 Osteomyelitis, unspecified: Secondary | ICD-10-CM

## 2021-03-10 NOTE — Progress Notes (Signed)
   Subjective:    Patient ID: Robert Huang [id number], male    DOB: 10-24-1960, 60 y.o.   MRN: [id number]  HPI Here to follow up on disability and to do some paperwork. He has recovered nicely from his recent foot surgery and today was his first day back at work. He had been out of work since 12-08-20.    Review of Systems  Constitutional: Negative.   Respiratory: Negative.    Cardiovascular: Negative.       Objective:   Physical Exam Constitutional:      Appearance: Normal appearance.  Cardiovascular:     Rate and Rhythm: Normal rate and regular rhythm.     Pulses: Normal pulses.     Heart sounds: Normal heart sounds.  Pulmonary:     Effort: Pulmonary effort is normal.     Breath sounds: Normal breath sounds.  Neurological:     Mental Status: He is alert.          Assessment & Plan:  His cellulitis has resolved and his surgery has healed. He is back to work as of today. We filled out his FMLA form together. Recheck as needed. We spent 20 minutes together.  Gershon Crane, MD

## 2021-03-19 ENCOUNTER — Other Ambulatory Visit: Payer: Self-pay | Admitting: Family Medicine

## 2021-03-21 ENCOUNTER — Other Ambulatory Visit: Payer: Self-pay | Admitting: Orthopedic Surgery

## 2021-03-23 ENCOUNTER — Telehealth (INDEPENDENT_AMBULATORY_CARE_PROVIDER_SITE_OTHER): Payer: 59 | Admitting: Family Medicine

## 2021-03-23 ENCOUNTER — Encounter: Payer: Self-pay | Admitting: Family Medicine

## 2021-03-23 DIAGNOSIS — L03116 Cellulitis of left lower limb: Secondary | ICD-10-CM

## 2021-03-23 MED ORDER — DOXYCYCLINE HYCLATE 100 MG PO CAPS: 100.0000 mg | ORAL_CAPSULE | Freq: Two times a day (BID) | ORAL | 0 refills | Status: AC

## 2021-03-23 NOTE — Progress Notes (Signed)
   Subjective:    Patient ID: Robert Huang [id number], male    DOB: 07/21/1961, 60 y.o.   MRN: [id number]  HPI Virtual Visit via Telephone Note  I connected with the patient on 03/23/21 at 10:45 AM EDT by telephone and verified that I am speaking with the correct person using two identifiers.   I discussed the limitations, risks, security and privacy concerns of performing an evaluation and management service by telephone and the availability of in person appointments. I also discussed with the patient that there may be a patient responsible charge related to this service. The patient expressed understanding and agreed to proceed.  Location patient: home Location provider: work or home office Participants present for the call: patient, provider Patient did not have a visit in the prior 7 days to address this/these issue(s).   History of Present Illness: Here for a blister on the left foot. He has had recurrent cellulitis in the left foot and has had a partial amputation. He went back to work on 03-10-21 , and he did a lot of walking around that first day. Since then he has limited his work to sitting at a desk, and in fact he is now working from home. The night after he worked that first day he noticed a blister on the bottom of the left foot. Since then the blister has popped and it has been slowly draining clear fluid since then. There is no odor to it. It is mildly painful. He has ben dressing it with clean dry gauze. He sits at his desk at home and he avoids walking as much as possible. He wants to continue to work from home permanently.    Observations/Objective: Patient sounds cheerful and well on the phone. I do not appreciate any SOB. Speech and thought processing are grossly intact. Patient reported vitals:  Assessment and Plan: To avoid another bout of cellulitis we will treat this with 14 days of Doxycycline. He knows to have [place] check this if there is any other change. He will keep  weight off the foot as mush as possible. We will write a letter for his boss to allow him to work from home full time.  Gershon Crane, MD   Follow Up Instructions:     [phone] 11-20 [phone] 21-30 I did not refer this patient for an OV in the next 24 hours for this/these issue(s).  I discussed the assessment and treatment plan with the patient. The patient was provided an opportunity to ask questions and all were answered. The patient agreed with the plan and demonstrated an understanding of the instructions.   The patient was advised to call back or seek an in-person evaluation if the symptoms worsen or if the condition fails to improve as anticipated.  I provided 18 minutes of non-face-to-face time during this encounter.   Gershon Crane, MD     Review of Systems     Objective:   Physical Exam        Assessment & Plan:

## 2021-03-25 ENCOUNTER — Encounter: Payer: Self-pay | Admitting: Family Medicine

## 2021-03-27 MED ORDER — ALPRAZOLAM 1 MG PO TABS: 1.0000 mg | ORAL_TABLET | Freq: Three times a day (TID) | ORAL | 5 refills | Status: DC | PRN

## 2021-03-27 NOTE — Telephone Encounter (Signed)
Done

## 2021-04-05 ENCOUNTER — Other Ambulatory Visit: Payer: Self-pay | Admitting: Orthopedic Surgery

## 2021-04-06 ENCOUNTER — Encounter: Payer: Self-pay | Admitting: Family Medicine

## 2021-04-06 ENCOUNTER — Telehealth: Payer: Self-pay

## 2021-04-06 NOTE — Telephone Encounter (Signed)
Patient called stating Infection on foot would like antibiotic sent to pharmacy

## 2021-04-07 ENCOUNTER — Other Ambulatory Visit: Payer: Self-pay

## 2021-04-07 MED ORDER — DOXYCYCLINE HYCLATE 100 MG PO TABS: 100.0000 mg | ORAL_TABLET | Freq: Two times a day (BID) | ORAL | 0 refills | Status: DC

## 2021-04-07 NOTE — Telephone Encounter (Signed)
Message complete.  Patient has been notified.

## 2021-04-07 NOTE — Telephone Encounter (Signed)
Left detailed message for pt regarding new Rx for Doxycycline 100 mg that was sent this morning to his pharmacy per Dr Clent Ridges

## 2021-04-07 NOTE — Telephone Encounter (Signed)
Call in Doxycycline 100 mg BID, #60 with no rf

## 2021-04-07 NOTE — Telephone Encounter (Signed)
Please advise 

## 2021-04-07 NOTE — Telephone Encounter (Signed)
We are calling in Doxycycline (per the other note today)

## 2021-04-07 NOTE — Telephone Encounter (Signed)
This was done this morning  

## 2021-04-07 NOTE — Telephone Encounter (Signed)
Rx sent and pt notified through MyChart

## 2021-04-20 ENCOUNTER — Other Ambulatory Visit: Payer: Self-pay | Admitting: Family Medicine

## 2021-04-22 ENCOUNTER — Other Ambulatory Visit: Payer: Self-pay | Admitting: Family Medicine

## 2021-05-01 ENCOUNTER — Other Ambulatory Visit: Payer: Self-pay | Admitting: Family Medicine

## 2021-05-09 ENCOUNTER — Other Ambulatory Visit: Payer: Self-pay

## 2021-05-09 ENCOUNTER — Ambulatory Visit (INDEPENDENT_AMBULATORY_CARE_PROVIDER_SITE_OTHER): Payer: 59 | Admitting: Family Medicine

## 2021-05-09 ENCOUNTER — Encounter: Payer: Self-pay | Admitting: Family Medicine

## 2021-05-09 VITALS — BP 118/78 | HR 57 | Temp 98.7°F | Wt 195.0 lb

## 2021-05-09 DIAGNOSIS — L97528 Non-pressure chronic ulcer of other part of left foot with other specified severity: Secondary | ICD-10-CM

## 2021-05-09 DIAGNOSIS — E11621 Type 2 diabetes mellitus with foot ulcer: Secondary | ICD-10-CM

## 2021-05-09 DIAGNOSIS — F1190 Opioid use, unspecified, uncomplicated: Secondary | ICD-10-CM | POA: Diagnosis not present

## 2021-05-09 DIAGNOSIS — Z23 Encounter for immunization: Secondary | ICD-10-CM

## 2021-05-09 MED ORDER — HYDROCODONE-ACETAMINOPHEN 10-325 MG PO TABS: 1.0000 | ORAL_TABLET | Freq: Four times a day (QID) | ORAL | 0 refills | Status: DC | PRN

## 2021-05-09 MED ORDER — HYDROCODONE-ACETAMINOPHEN 10-325 MG PO TABS: 1.0000 | ORAL_TABLET | Freq: Four times a day (QID) | ORAL | 0 refills | Status: AC | PRN

## 2021-05-09 MED ORDER — DOXYCYCLINE HYCLATE 100 MG PO CAPS: 100.0000 mg | ORAL_CAPSULE | Freq: Two times a day (BID) | ORAL | 2 refills | Status: AC

## 2021-05-09 MED ORDER — ALPRAZOLAM 1 MG PO TABS: 1.0000 mg | ORAL_TABLET | Freq: Three times a day (TID) | ORAL | 1 refills | Status: DC | PRN

## 2021-05-09 NOTE — Progress Notes (Signed)
   Subjective:    Patient ID: Robert Huang [id number], male    DOB: May 30, 1961, 60 y.o.   MRN: [id number]  HPI Here for pain management. He has been dealing with a little more foot pain than usual lately. He has a small wound on the left foot which he is dressing with silver alginate once a week. The wound is closing in slowly but surely. He recently sold his house and he has moved to [place], [place]. 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    Review of Systems  Constitutional: Negative.   Respiratory: Negative.    Cardiovascular: Negative.   Skin:  Positive for wound.      Objective:   Physical Exam Constitutional:      Appearance: Normal appearance.  Neurological:     Mental Status: He is alert.          Assessment & Plan:  Pain management. Indication for chronic opioid: foot pain Medication and dose: Norco 10-325 # pills per month: 120 Last UDS date: 01-02-21 Opioid Treatment Agreement signed (Y/N): 12-10-18 Opioid Treatment Agreement last reviewed with patient:  05-09-21 NCCSRS reviewed this encounter (include red flags): Yes Meds were refilled.  Gershon Crane, MD

## 2021-05-09 NOTE — Addendum Note (Signed)
Addended by: Carola Rhine on: 05/09/2021 05:00 PM   Modules accepted: Orders

## 2021-06-16 ENCOUNTER — Other Ambulatory Visit: Payer: Self-pay | Admitting: Family Medicine

## 2021-07-20 ENCOUNTER — Other Ambulatory Visit: Payer: Self-pay | Admitting: Family Medicine

## 2021-07-30 ENCOUNTER — Other Ambulatory Visit: Payer: Self-pay | Admitting: Family Medicine

## 2021-08-05 ENCOUNTER — Other Ambulatory Visit: Payer: Self-pay | Admitting: Family Medicine

## 2021-08-11 ENCOUNTER — Encounter: Payer: Self-pay | Admitting: Family Medicine

## 2021-08-22 ENCOUNTER — Other Ambulatory Visit: Payer: Self-pay | Admitting: Family Medicine

## 2021-08-22 NOTE — Telephone Encounter (Signed)
Pt LOV was on 05/09/2021 Last refill was one on 01/30/2021 Please advise

## 2021-08-29 ENCOUNTER — Encounter: Payer: Self-pay | Admitting: Family Medicine

## 2021-08-29 ENCOUNTER — Ambulatory Visit (INDEPENDENT_AMBULATORY_CARE_PROVIDER_SITE_OTHER): Payer: 59 | Admitting: Family Medicine

## 2021-08-29 DIAGNOSIS — M79673 Pain in unspecified foot: Secondary | ICD-10-CM | POA: Diagnosis not present

## 2021-08-29 DIAGNOSIS — F1190 Opioid use, unspecified, uncomplicated: Secondary | ICD-10-CM | POA: Diagnosis not present

## 2021-08-29 DIAGNOSIS — G8929 Other chronic pain: Secondary | ICD-10-CM | POA: Diagnosis not present

## 2021-08-29 MED ORDER — HYDROCODONE-ACETAMINOPHEN 10-325 MG PO TABS
1.0000 | ORAL_TABLET | Freq: Four times a day (QID) | ORAL | 0 refills | Status: DC | PRN
Start: 2021-09-29 — End: 2021-08-29

## 2021-08-29 MED ORDER — HYDROCODONE-ACETAMINOPHEN 10-325 MG PO TABS
1.0000 | ORAL_TABLET | Freq: Four times a day (QID) | ORAL | 0 refills | Status: DC | PRN
Start: 2021-08-29 — End: 2021-08-29

## 2021-08-29 MED ORDER — HYDROCODONE-ACETAMINOPHEN 10-325 MG PO TABS: 1.0000 | ORAL_TABLET | Freq: Four times a day (QID) | ORAL | 0 refills | Status: DC | PRN

## 2021-08-29 NOTE — Progress Notes (Signed)
° °  Subjective:    Patient ID: Robert Huang [id number], male    DOB: [birth year], 61 y.o.   MRN: [id number]  HPI Virtual Visit via Telephone Note  I connected with the patient on 08/29/21 at  3:00 PM EST by telephone and verified that I am speaking with the correct person using two identifiers.   I discussed the limitations, risks, security and privacy concerns of performing an evaluation and management service by telephone and the availability of in person appointments. I also discussed with the patient that there may be a patient responsible charge related to this service. The patient expressed understanding and agreed to proceed.  Location patient: home Location provider: work or home office Participants present for the call: patient, provider Patient did not have a visit in the prior 7 days to address this/these issue(s).   History of Present Illness: Here for pain management, he is doing well.    Observations/Objective: Patient sounds cheerful and well on the phone. I do not appreciate any SOB. Speech and thought processing are grossly intact. Patient reported vitals:  Assessment and Plan: Pain management. Indication for chronic opioid: foot pain Medication and dose: Norco 10-325 # pills per month: 120 Last UDS date: 01-02-21 Opioid Treatment Agreement signed (Y/N): 12-10-18 Opioid Treatment Agreement last reviewed with patient:  08-29-21 N[facility]SRS reviewed this encounter (include red flags): Yes Meds were refilled.  Alysia Penna, MD   Follow Up Instructions:     [phone] 5-10 [phone] 11-20 [phone] 21-30 I did not refer this patient for an OV in the next 24 hours for this/these issue(s).  I discussed the assessment and treatment plan with the patient. The patient was provided an opportunity to ask questions and all were answered. The patient agreed with the plan and demonstrated an understanding of the instructions.   The patient was advised to call back or seek an in-person  evaluation if the symptoms worsen or if the condition fails to improve as anticipated.  I provided 13 minutes of non-face-to-face time during this encounter.   Alysia Penna, MD     Review of Systems     Objective:   Physical Exam        Assessment & Plan:

## 2021-09-20 ENCOUNTER — Other Ambulatory Visit: Payer: Self-pay | Admitting: Family Medicine

## 2021-09-24 ENCOUNTER — Other Ambulatory Visit: Payer: Self-pay | Admitting: Family Medicine

## 2021-09-28 ENCOUNTER — Telehealth (INDEPENDENT_AMBULATORY_CARE_PROVIDER_SITE_OTHER): Payer: 59 | Admitting: Family Medicine

## 2021-09-28 ENCOUNTER — Encounter: Payer: Self-pay | Admitting: Family Medicine

## 2021-09-28 DIAGNOSIS — M25551 Pain in right hip: Secondary | ICD-10-CM | POA: Diagnosis not present

## 2021-09-28 DIAGNOSIS — G8929 Other chronic pain: Secondary | ICD-10-CM

## 2021-09-28 NOTE — Progress Notes (Signed)
Subjective:    Patient ID: Robert Huang [id number], male    DOB: September 02, 1960, 61 y.o.   MRN: [id number]  HPI Virtual Visit via Video Note  I connected with the patient on 09/28/21 at 10:45 AM EST by a video enabled telemedicine application and verified that I am speaking with the correct person using two identifiers.  Location patient: home Location provider:work or home office Persons participating in the virtual visit: patient, provider  I discussed the limitations of evaluation and management by telemedicine and the availability of in person appointments. The patient expressed understanding and agreed to proceed.   HPI: Here for worsening pain in the right hip. This started about a year ago, but it has become much more painful. Some days he cannot put weight on it, and he uses crutches. The combination of indomethacin and Norco make it bearable. Sometimes the hip seems to shift so that the joint becomes unstable.    ROS: See pertinent positives and negatives per HPI.  Past Medical History:  Diagnosis Date   Anxiety    CAD (coronary artery disease)    sees Dr. Jens Somrenshaw, normal Stress test 07-16-12    Diabetes mellitus    DJD (degenerative joint disease)    Gout    Hyperlipidemia    Hypertension    Hypothyroid    Osteoarthritis    Renal calculus    hx   Umbilical hernia     Past Surgical History:  Procedure Laterality Date   AMPUTATION  07/17/2012   Procedure: AMPUTATION RAY;  Surgeon: Toni ArthursJohn Hewitt, MD;  [facility];  Service: Orthopedics;  Laterality: Right;  right hallux amputation (1st ray resection )   AMPUTATION Left 03/06/2019   Procedure: Ray Amputation left foot;  Surgeon: Yolonda Kidaogers, Jason Patrick, MD;  Location: [facility];  Service: Orthopedics;  Laterality: Left;   CORONARY ANGIOPLASTY WITH STENT PLACEMENT     FINGER SURGERY     left index finger   I & D EXTREMITY  05/19/2012   Procedure: IRRIGATION AND DEBRIDEMENT EXTREMITY;  Surgeon: Senaida LangeKevin M Supple, MD;   Location: [facility];  Service: Orthopedics;  Laterality: Right;  Right Great toe   I & D Toe  05/19/2012   rt great toe   TONSILLECTOMY     TRANSMETATARSAL AMPUTATION Left 12/10/2020   Procedure: TRANSMETATARSAL AMPUTATION LEFT;  Surgeon: Nadara Mustarduda, Marcus V, MD;  [facility];  Service: Orthopedics;  Laterality: Left;    Family History  Problem Relation Age of Onset   Heart attack Mother    Arthritis Other    Coronary artery disease Other    Diabetes Other    Hypertension Other    Prostate cancer Other    Stroke Other      Current Outpatient Medications:    acetaminophen (TYLENOL) 325 MG tablet, Take 2 tablets (650 mg total) by mouth every 6 (six) hours as needed for mild pain (or Fever >/= 101)., Disp:  , Rfl:    ALPRAZolam (XANAX) 1 MG tablet, Take 1 tablet (1 mg total) by mouth 3 (three) times daily as needed for anxiety., Disp: 270 tablet, Rfl: 1   amLODipine (NORVASC) 10 MG tablet, TAKE 1 TABLET (10 MG TOTAL) BY MOUTH DAILY. <PLEASE MAKE APPOINTMENT FOR REFILLS>, Disp: 90 tablet, Rfl: 0   Aspirin-Acetaminophen-Caffeine 500-325-65 MG PACK, Take 1 Package by mouth daily as needed (pain)., Disp: , Rfl:    cloNIDine (CATAPRES) 0.3 MG tablet, TAKE 1 TABLET (0.3 MG TOTAL) BY MOUTH 2 (TWO) TIMES DAILY.  NEED OV., Disp: 180 tablet, Rfl: 1   colchicine 0.6 MG tablet, TAKE 1 TABLET (0.6 MG TOTAL) BY MOUTH EVERY 6 (SIX) HOURS AS NEEDED (GOUT)., Disp: 360 tablet, Rfl: 0   cyanocobalamin (,VITAMIN B-12,) 1000 MCG/ML injection, Inject 1 mL (1,000 mcg total) into the muscle once a week., Disp: 10 mL, Rfl: 11   ezetimibe (ZETIA) 10 MG tablet, TAKE 1 TABLET BY MOUTH EVERY DAY (Patient taking differently: Take 10 mg by mouth daily.), Disp: 90 tablet, Rfl: 3   famotidine (PEPCID) 40 MG tablet, Take 1 tablet (40 mg total) by mouth daily., Disp: 90 tablet, Rfl: 3   fenofibrate 160 MG tablet, TAKE 1 TABLET (160 MG TOTAL) BY MOUTH DAILY. NEED OV. (Patient taking differently: Take 160 mg by mouth daily.), Disp:  90 tablet, Rfl: 3   ferrous sulfate 325 (65 FE) MG EC tablet, Take 1 tablet (325 mg total) by mouth 2 (two) times daily., Disp: 60 tablet, Rfl: 0   fluticasone (FLONASE) 50 MCG/ACT nasal spray, Place 2 sprays into both nostrils daily., Disp: 16 g, Rfl: 6   glipiZIDE (GLUCOTROL) 5 MG tablet, TAKE 1 TABLET BY MOUTH TWICE A DAY, Disp: 180 tablet, Rfl: 1   [START ON 10/27/2021] HYDROcodone-acetaminophen (NORCO) 10-325 MG tablet, Take 1 tablet by mouth every 6 (six) hours as needed for moderate pain., Disp: 120 tablet, Rfl: 0   indomethacin (INDOCIN SR) 75 MG CR capsule, Take 1 capsule (75 mg total) by mouth daily as needed for moderate pain., Disp: , Rfl:    levothyroxine (SYNTHROID) 112 MCG tablet, TAKE 1 TABLET BY MOUTH EVERY DAY, Disp: 90 tablet, Rfl: 1   lisinopril-hydrochlorothiazide (ZESTORETIC) 20-12.5 MG tablet, TAKE 1 TABLET BY MOUTH 2 (TWO) TIMES DAILY. 10AM AND 5PM, Disp: 180 tablet, Rfl: 0   metFORMIN (GLUCOPHAGE) 1000 MG tablet, TAKE 1 TABLET BY MOUTH TWO TIMES A DAY WITH A MEAL, Disp: 180 tablet, Rfl: 1   metoprolol succinate (TOPROL-XL) 100 MG 24 hr tablet, TAKE 1 TABLET BY MOUTH TWICE A DAY, Disp: 180 tablet, Rfl: 0   mupirocin ointment (BACTROBAN) 2 %, Apply 1 application topically 2 (two) times daily., Disp: 30 g, Rfl: 5   nitroGLYCERIN (NITROSTAT) 0.4 MG SL tablet, Place 1 tablet (0.4 mg total) under the tongue every 5 (five) minutes as needed. For chest pain. (Patient taking differently: Place 0.4 mg under the tongue every 5 (five) minutes as needed for chest pain.), Disp: 25 tablet, Rfl: 5   omeprazole (PRILOSEC) 40 MG capsule, TAKE 1 CAPSULE (40 MG TOTAL) BY MOUTH DAILY., Disp: 90 capsule, Rfl: 2   ONETOUCH ULTRA test strip, 1 EACH BY OTHER ROUTE DAILY. USE AS INSTRUCTED, Disp: 100 strip, Rfl: 1   polyethylene glycol (MIRALAX) 17 g packet, Take 17 g by mouth daily as needed., Disp: 14 each, Rfl: 0   rosuvastatin (CRESTOR) 40 MG tablet, Take 1 tablet (40 mg total) by mouth daily., Disp:  90 tablet, Rfl: 1   Syringe/Needle, Disp, (SYRINGE 3CC/25GX1") 25G X 1" 3 ML MISC, 1 application by Does not apply route once a week., Disp: 50 each, Rfl: 5   zolpidem (AMBIEN) 10 MG tablet, TAKE 1 TABLET BY MOUTH AT BEDTIME AS NEEDED FOR SLEEP, Disp: 90 tablet, Rfl: 1  EXAM:  VITALS per patient if applicable:  GENERAL: alert, oriented, appears well and in no acute distress  HEENT: atraumatic, conjunttiva clear, no obvious abnormalities on inspection of external nose and ears  NECK: normal movements of the head and neck  LUNGS:  on inspection no signs of respiratory distress, breathing rate appears normal, no obvious gross SOB, gasping or wheezing  CV: no obvious cyanosis  MS: moves all visible extremities without noticeable abnormality  PSYCH/NEURO: pleasant and cooperative, no obvious depression or anxiety, speech and thought processing grossly intact  ASSESSMENT AND PLAN: Right hip pain, likely due to osteoarthritis. We will refer him to Orthopedics.  Gershon Crane, MD  Discussed the following assessment and plan:  No diagnosis found.     I discussed the assessment and treatment plan with the patient. The patient was provided an opportunity to ask questions and all were answered. The patient agreed with the plan and demonstrated an understanding of the instructions.   The patient was advised to call back or seek an in-person evaluation if the symptoms worsen or if the condition fails to improve as anticipated.      Review of Systems     Objective:   Physical Exam        Assessment & Plan:

## 2021-10-22 ENCOUNTER — Other Ambulatory Visit: Payer: Self-pay

## 2021-10-22 ENCOUNTER — Encounter (HOSPITAL_COMMUNITY): Payer: Self-pay

## 2021-10-22 ENCOUNTER — Emergency Department (HOSPITAL_COMMUNITY): Payer: No Typology Code available for payment source

## 2021-10-22 ENCOUNTER — Inpatient Hospital Stay (HOSPITAL_COMMUNITY)
Admission: EM | Admit: 2021-10-22 | Discharge: 2021-10-28 | DRG: 239 | Disposition: A | Discharge status: Home / Self Care | Payer: No Typology Code available for payment source | Attending: Family Medicine | Admitting: Family Medicine

## 2021-10-22 DIAGNOSIS — Z79899 Other long term (current) drug therapy: Secondary | ICD-10-CM

## 2021-10-22 DIAGNOSIS — E1152 Type 2 diabetes mellitus with diabetic peripheral angiopathy with gangrene: Principal | ICD-10-CM | POA: Diagnosis present

## 2021-10-22 DIAGNOSIS — Z7984 Long term (current) use of oral hypoglycemic drugs: Secondary | ICD-10-CM

## 2021-10-22 DIAGNOSIS — Z833 Family history of diabetes mellitus: Secondary | ICD-10-CM

## 2021-10-22 DIAGNOSIS — E11621 Type 2 diabetes mellitus with foot ulcer: Secondary | ICD-10-CM | POA: Diagnosis present

## 2021-10-22 DIAGNOSIS — E1169 Type 2 diabetes mellitus with other specified complication: Secondary | ICD-10-CM | POA: Diagnosis not present

## 2021-10-22 DIAGNOSIS — N182 Chronic kidney disease, stage 2 (mild): Secondary | ICD-10-CM

## 2021-10-22 DIAGNOSIS — I10 Essential (primary) hypertension: Secondary | ICD-10-CM | POA: Diagnosis present

## 2021-10-22 DIAGNOSIS — Z89411 Acquired absence of right great toe: Secondary | ICD-10-CM

## 2021-10-22 DIAGNOSIS — E1165 Type 2 diabetes mellitus with hyperglycemia: Secondary | ICD-10-CM | POA: Diagnosis present

## 2021-10-22 DIAGNOSIS — E118 Type 2 diabetes mellitus with unspecified complications: Secondary | ICD-10-CM | POA: Diagnosis present

## 2021-10-22 DIAGNOSIS — E785 Hyperlipidemia, unspecified: Secondary | ICD-10-CM | POA: Diagnosis present

## 2021-10-22 DIAGNOSIS — E43 Unspecified severe protein-calorie malnutrition: Secondary | ICD-10-CM

## 2021-10-22 DIAGNOSIS — L089 Local infection of the skin and subcutaneous tissue, unspecified: Secondary | ICD-10-CM

## 2021-10-22 DIAGNOSIS — I2510 Atherosclerotic heart disease of native coronary artery without angina pectoris: Secondary | ICD-10-CM | POA: Diagnosis present

## 2021-10-22 DIAGNOSIS — L97519 Non-pressure chronic ulcer of other part of right foot with unspecified severity: Secondary | ICD-10-CM | POA: Diagnosis present

## 2021-10-22 DIAGNOSIS — E11628 Type 2 diabetes mellitus with other skin complications: Principal | ICD-10-CM | POA: Diagnosis present

## 2021-10-22 DIAGNOSIS — Z20822 Contact with and (suspected) exposure to covid-19: Secondary | ICD-10-CM | POA: Diagnosis present

## 2021-10-22 DIAGNOSIS — Z6827 Body mass index (BMI) 27.0-27.9, adult: Secondary | ICD-10-CM

## 2021-10-22 DIAGNOSIS — Z955 Presence of coronary angioplasty implant and graft: Secondary | ICD-10-CM

## 2021-10-22 DIAGNOSIS — Z89432 Acquired absence of left foot: Secondary | ICD-10-CM

## 2021-10-22 DIAGNOSIS — E039 Hypothyroidism, unspecified: Secondary | ICD-10-CM | POA: Diagnosis present

## 2021-10-22 DIAGNOSIS — I96 Gangrene, not elsewhere classified: Secondary | ICD-10-CM | POA: Diagnosis present

## 2021-10-22 DIAGNOSIS — F411 Generalized anxiety disorder: Secondary | ICD-10-CM | POA: Diagnosis not present

## 2021-10-22 DIAGNOSIS — D631 Anemia in chronic kidney disease: Secondary | ICD-10-CM | POA: Diagnosis present

## 2021-10-22 DIAGNOSIS — E1140 Type 2 diabetes mellitus with diabetic neuropathy, unspecified: Secondary | ICD-10-CM | POA: Diagnosis present

## 2021-10-22 DIAGNOSIS — E871 Hypo-osmolality and hyponatremia: Secondary | ICD-10-CM

## 2021-10-22 DIAGNOSIS — L02612 Cutaneous abscess of left foot: Secondary | ICD-10-CM

## 2021-10-22 DIAGNOSIS — F17220 Nicotine dependence, chewing tobacco, uncomplicated: Secondary | ICD-10-CM | POA: Diagnosis present

## 2021-10-22 DIAGNOSIS — Z79890 Hormone replacement therapy (postmenopausal): Secondary | ICD-10-CM

## 2021-10-22 DIAGNOSIS — S88112A Complete traumatic amputation at level between knee and ankle, left lower leg, initial encounter: Secondary | ICD-10-CM

## 2021-10-22 DIAGNOSIS — D509 Iron deficiency anemia, unspecified: Secondary | ICD-10-CM | POA: Diagnosis present

## 2021-10-22 DIAGNOSIS — A419 Sepsis, unspecified organism: Secondary | ICD-10-CM | POA: Diagnosis not present

## 2021-10-22 DIAGNOSIS — L97529 Non-pressure chronic ulcer of other part of left foot with unspecified severity: Secondary | ICD-10-CM | POA: Diagnosis present

## 2021-10-22 DIAGNOSIS — I129 Hypertensive chronic kidney disease with stage 1 through stage 4 chronic kidney disease, or unspecified chronic kidney disease: Secondary | ICD-10-CM | POA: Diagnosis present

## 2021-10-22 DIAGNOSIS — Z8249 Family history of ischemic heart disease and other diseases of the circulatory system: Secondary | ICD-10-CM

## 2021-10-22 DIAGNOSIS — E1122 Type 2 diabetes mellitus with diabetic chronic kidney disease: Secondary | ICD-10-CM | POA: Diagnosis present

## 2021-10-22 DIAGNOSIS — E876 Hypokalemia: Secondary | ICD-10-CM | POA: Diagnosis present

## 2021-10-22 DIAGNOSIS — Z823 Family history of stroke: Secondary | ICD-10-CM

## 2021-10-22 DIAGNOSIS — M869 Osteomyelitis, unspecified: Secondary | ICD-10-CM | POA: Diagnosis present

## 2021-10-22 DIAGNOSIS — Z89422 Acquired absence of other left toe(s): Secondary | ICD-10-CM | POA: Diagnosis present

## 2021-10-22 DIAGNOSIS — N179 Acute kidney failure, unspecified: Secondary | ICD-10-CM | POA: Diagnosis present

## 2021-10-22 DIAGNOSIS — Z8042 Family history of malignant neoplasm of prostate: Secondary | ICD-10-CM

## 2021-10-22 LAB — CBG MONITORING, ED: Glucose-Capillary: 149 mg/dL — ABNORMAL HIGH (ref 70–99)

## 2021-10-22 LAB — CBC WITH DIFFERENTIAL/PLATELET
Abs Immature Granulocytes: 0.08 10*3/uL — ABNORMAL HIGH (ref 0.00–0.07)
Basophils Absolute: 0 10*3/uL (ref 0.0–0.1)
Basophils Relative: 0 %
Eosinophils Absolute: 0.2 10*3/uL (ref 0.0–0.5)
Eosinophils Relative: 1 %
HCT: 33.4 % — ABNORMAL LOW (ref 39.0–52.0)
Hemoglobin: 10.8 g/dL — ABNORMAL LOW (ref 13.0–17.0)
Immature Granulocytes: 1 %
Lymphocytes Relative: 6 %
Lymphs Abs: 0.8 10*3/uL (ref 0.7–4.0)
MCH: 29 pg (ref 26.0–34.0)
MCHC: 32.3 g/dL (ref 30.0–36.0)
MCV: 89.8 fL (ref 80.0–100.0)
Monocytes Absolute: 1.2 10*3/uL — ABNORMAL HIGH (ref 0.1–1.0)
Monocytes Relative: 9 %
Neutro Abs: 10.3 10*3/uL — ABNORMAL HIGH (ref 1.7–7.7)
Neutrophils Relative %: 83 %
Platelets: 277 10*3/uL (ref 150–400)
RBC: 3.72 MIL/uL — ABNORMAL LOW (ref 4.22–5.81)
RDW: 14.6 % (ref 11.5–15.5)
WBC: 12.5 10*3/uL — ABNORMAL HIGH (ref 4.0–10.5)
nRBC: 0 % (ref 0.0–0.2)

## 2021-10-22 LAB — BASIC METABOLIC PANEL
Anion gap: 15 (ref 5–15)
BUN: 29 mg/dL — ABNORMAL HIGH (ref 8–23)
CO2: 16 mmol/L — ABNORMAL LOW (ref 22–32)
Calcium: 8.4 mg/dL — ABNORMAL LOW (ref 8.9–10.3)
Chloride: 101 mmol/L (ref 98–111)
Creatinine, Ser: 1.39 mg/dL — ABNORMAL HIGH (ref 0.61–1.24)
GFR, Estimated: 58 mL/min — ABNORMAL LOW (ref >60)
Glucose, Bld: 172 mg/dL — ABNORMAL HIGH (ref 70–99)
Potassium: 3.2 mmol/L — ABNORMAL LOW (ref 3.5–5.1)
Sodium: 132 mmol/L — ABNORMAL LOW (ref 135–145)

## 2021-10-22 LAB — C-REACTIVE PROTEIN: CRP: 20.1 mg/dL — ABNORMAL HIGH (ref <1.0)

## 2021-10-22 LAB — SEDIMENTATION RATE: Sed Rate: >140 mm/hr — ABNORMAL HIGH (ref 0–16)

## 2021-10-22 LAB — MRSA NEXT GEN BY PCR, NASAL: MRSA by PCR Next Gen: NOT DETECTED

## 2021-10-22 LAB — MAGNESIUM: Magnesium: 1.4 mg/dL — ABNORMAL LOW (ref 1.7–2.4)

## 2021-10-22 MED ORDER — EZETIMIBE 10 MG PO TABS
10.0000 mg | ORAL_TABLET | Freq: Every day | ORAL | Status: DC
  Administered 2021-10-23 – 2021-10-28 (×5): 10 mg via ORAL
  Filled 2021-10-22 (×5): qty 1

## 2021-10-22 MED ORDER — SODIUM CHLORIDE 0.9% FLUSH
3.0000 mL | Freq: Two times a day (BID) | INTRAVENOUS | Status: DC
  Administered 2021-10-22 – 2021-10-27 (×5): 3 mL via INTRAVENOUS

## 2021-10-22 MED ORDER — CLONIDINE HCL 0.3 MG PO TABS
0.3000 mg | ORAL_TABLET | Freq: Two times a day (BID) | ORAL | Status: DC
  Administered 2021-10-22 – 2021-10-28 (×10): 0.3 mg via ORAL
  Filled 2021-10-22 (×11): qty 1

## 2021-10-22 MED ORDER — POTASSIUM CHLORIDE 20 MEQ PO PACK
40.0000 meq | PACK | Freq: Two times a day (BID) | ORAL | Status: DC
  Administered 2021-10-22 – 2021-10-23 (×3): 40 meq via ORAL
  Filled 2021-10-22 (×3): qty 2

## 2021-10-22 MED ORDER — ACETAMINOPHEN 650 MG RE SUPP: 650.0000 mg | Freq: Four times a day (QID) | RECTAL | Status: DC | PRN

## 2021-10-22 MED ORDER — HYDROCHLOROTHIAZIDE 12.5 MG PO TABS
12.5000 mg | ORAL_TABLET | ORAL | Status: DC
  Filled 2021-10-22: qty 1

## 2021-10-22 MED ORDER — LEVOTHYROXINE SODIUM 112 MCG PO TABS
112.0000 ug | ORAL_TABLET | Freq: Every day | ORAL | Status: DC
  Administered 2021-10-23 – 2021-10-28 (×5): 112 ug via ORAL
  Filled 2021-10-22 (×5): qty 1

## 2021-10-22 MED ORDER — LISINOPRIL-HYDROCHLOROTHIAZIDE 20-12.5 MG PO TABS: 1.0000 | ORAL_TABLET | Freq: Two times a day (BID) | ORAL | Status: DC

## 2021-10-22 MED ORDER — VANCOMYCIN HCL 2000 MG/400ML IV SOLN
2000.0000 mg | Freq: Once | INTRAVENOUS | Status: AC
Start: 2021-10-22 — End: 2021-10-22
  Administered 2021-10-22: 2000 mg via INTRAVENOUS
  Filled 2021-10-22 (×2): qty 400

## 2021-10-22 MED ORDER — ROSUVASTATIN CALCIUM 20 MG PO TABS
40.0000 mg | ORAL_TABLET | Freq: Every day | ORAL | Status: DC
  Administered 2021-10-23 – 2021-10-28 (×5): 40 mg via ORAL
  Filled 2021-10-22 (×5): qty 2

## 2021-10-22 MED ORDER — FENOFIBRATE 160 MG PO TABS
160.0000 mg | ORAL_TABLET | Freq: Every day | ORAL | Status: DC
  Administered 2021-10-23 – 2021-10-28 (×5): 160 mg via ORAL
  Filled 2021-10-22 (×5): qty 1

## 2021-10-22 MED ORDER — SODIUM CHLORIDE 0.9 % IV SOLN
2.0000 g | Freq: Three times a day (TID) | INTRAVENOUS | Status: DC
  Administered 2021-10-22 – 2021-10-28 (×16): 2 g via INTRAVENOUS
  Filled 2021-10-22 (×17): qty 2

## 2021-10-22 MED ORDER — VANCOMYCIN HCL 1750 MG/350ML IV SOLN
1750.0000 mg | INTRAVENOUS | Status: DC
Start: 2021-10-23 — End: 2021-10-25
  Administered 2021-10-23 – 2021-10-24 (×2): 1750 mg via INTRAVENOUS
  Filled 2021-10-22 (×2): qty 350

## 2021-10-22 MED ORDER — POTASSIUM CHLORIDE CRYS ER 20 MEQ PO TBCR: 40.0000 meq | EXTENDED_RELEASE_TABLET | Freq: Once | ORAL | Status: DC

## 2021-10-22 MED ORDER — AMLODIPINE BESYLATE 10 MG PO TABS
10.0000 mg | ORAL_TABLET | Freq: Every day | ORAL | Status: DC
  Administered 2021-10-23 – 2021-10-28 (×5): 10 mg via ORAL
  Filled 2021-10-22 (×5): qty 1

## 2021-10-22 MED ORDER — SODIUM CHLORIDE 0.9 % IV BOLUS
1000.0000 mL | Freq: Once | INTRAVENOUS | Status: AC
Start: 2021-10-22 — End: 2021-10-23
  Administered 2021-10-23: 1000 mL via INTRAVENOUS

## 2021-10-22 MED ORDER — POTASSIUM CHLORIDE CRYS ER 20 MEQ PO TBCR
20.0000 meq | EXTENDED_RELEASE_TABLET | Freq: Once | ORAL | Status: AC
Start: 2021-10-22 — End: 2021-10-22
  Administered 2021-10-22: 20 meq via ORAL
  Filled 2021-10-22: qty 1

## 2021-10-22 MED ORDER — PIPERACILLIN-TAZOBACTAM 3.375 G IVPB 30 MIN: 3.3750 g | Freq: Once | INTRAVENOUS | Status: DC

## 2021-10-22 MED ORDER — METRONIDAZOLE 500 MG/100ML IV SOLN
500.0000 mg | Freq: Two times a day (BID) | INTRAVENOUS | Status: DC
  Administered 2021-10-22 – 2021-10-28 (×12): 500 mg via INTRAVENOUS
  Filled 2021-10-22 (×13): qty 100

## 2021-10-22 MED ORDER — METOPROLOL SUCCINATE ER 100 MG PO TB24
100.0000 mg | ORAL_TABLET | Freq: Two times a day (BID) | ORAL | Status: DC
  Administered 2021-10-22 – 2021-10-27 (×11): 100 mg via ORAL
  Filled 2021-10-22 (×12): qty 1

## 2021-10-22 MED ORDER — LISINOPRIL 20 MG PO TABS: 20.0000 mg | ORAL_TABLET | ORAL | Status: DC

## 2021-10-22 MED ORDER — ALPRAZOLAM 0.5 MG PO TABS
1.0000 mg | ORAL_TABLET | Freq: Three times a day (TID) | ORAL | Status: DC | PRN
  Administered 2021-10-22 – 2021-10-24 (×2): 1 mg via ORAL
  Filled 2021-10-22 (×2): qty 2

## 2021-10-22 MED ORDER — HYDROCODONE-ACETAMINOPHEN 10-325 MG PO TABS
1.0000 | ORAL_TABLET | Freq: Four times a day (QID) | ORAL | Status: DC | PRN
Start: 2021-10-22 — End: 2021-10-28
  Administered 2021-10-22 – 2021-10-25 (×5): 1 via ORAL
  Filled 2021-10-22 (×7): qty 1

## 2021-10-22 MED ORDER — POLYETHYLENE GLYCOL 3350 17 G PO PACK: 17.0000 g | PACK | Freq: Every day | ORAL | Status: DC | PRN

## 2021-10-22 MED ORDER — ENOXAPARIN SODIUM 40 MG/0.4ML IJ SOSY
40.0000 mg | PREFILLED_SYRINGE | INTRAMUSCULAR | Status: DC
  Administered 2021-10-22 – 2021-10-27 (×6): 40 mg via SUBCUTANEOUS
  Filled 2021-10-22 (×6): qty 0.4

## 2021-10-22 MED ORDER — PANTOPRAZOLE SODIUM 40 MG PO TBEC
40.0000 mg | DELAYED_RELEASE_TABLET | Freq: Every day | ORAL | Status: DC
  Administered 2021-10-23 – 2021-10-26 (×4): 40 mg via ORAL
  Filled 2021-10-22 (×4): qty 1

## 2021-10-22 MED ORDER — HYDROMORPHONE HCL 1 MG/ML IJ SOLN
1.0000 mg | Freq: Once | INTRAMUSCULAR | Status: AC
  Administered 2021-10-22: 1 mg via INTRAVENOUS
  Filled 2021-10-22: qty 1

## 2021-10-22 MED ORDER — ACETAMINOPHEN 325 MG PO TABS: 650.0000 mg | ORAL_TABLET | Freq: Four times a day (QID) | ORAL | Status: DC | PRN

## 2021-10-22 NOTE — Progress Notes (Addendum)
Pharmacy Antibiotic Note  Robert Huang is a 61 y.o. male admitted on 10/22/2021.  Pharmacy has been consulted for vancomycin and cefepime dosing for foot infection.  Patient with a history of transmetatarsal amputation of his left foot about a year ago and DM. Patient presenting with left foot pain.  SCr 1.39 - slightly above baseline WBC 12.5; T 98.5 F  Plan: Metronidazole per MD Cefepime 2g q8hr Vancomycin 2000 mg once then 1750 mg q24hr (eAUC 485) unless change in renal function Trend WBC, Fever, Renal function, & Clinical course F/u cultures, clinical course, WBC, fever De-escalate when able Levels at steady state  Height: 6' (182.9 cm) Weight: 90.7 kg (200 lb) IBW/kg (Calculated) : 77.6  Temp (24hrs), Avg:98.5 F (36.9 C), Min:98.5 F (36.9 C), Max:98.5 F (36.9 C)  Recent Labs  Lab 10/22/21 1710  WBC 12.5*  CREATININE 1.39*    Estimated Creatinine Clearance: 61.3 mL/min (A) (by C-G formula based on SCr of 1.39 mg/dL (H)).    No Known Allergies  Antimicrobials this admission: vancomycin 2/26 >>  cefepime 2/26 >>   Microbiology results: Pending  Thank you for allowing pharmacy to be a part of this patients care.  Delmar Landau, PharmD, BCPS 10/22/2021 6:05 PM ED Clinical Pharmacist -  [phone]

## 2021-10-22 NOTE — H&P (Signed)
History and Physical   Robert Huang [id number] BLT:[id number] DOB: 03/18/1961 DOA: 10/22/2021  PCP: [name], MD   Patient coming from: Home  Chief Complaint: Foot pain and blistering  HPI: Robert Huang is a 61 y.o. male with medical history significant of anemia, hypothyroidism, gout, hypertension, hyperlipidemia, diabetes, anxiety, osteomyelitis status post right great toe amputation and status post left ray amputation presenting with left foot pain.  Patient states that for the past 4 days he has had some pain in his left foot.  It has been increasing over that time.  Today he noticed redness in the foot with multiple blisters in the shower and stated "looks like it is going to fall off ".  As above does have history of prior infections and osteomyelitis requiring amputation.  Also has a history of MRSA infection.  Does report chills but denies fevers.  He states that the summer he got an ulceration on the bottom of his left foot that has been very slow to heal, but does not seem infected at any point.  Additionally denies chest pain, shortness of breath, abdominal pain, constipation, diarrhea, nausea, vomiting.  ED Course: Vital signs in the ED stable.  Lab work-up included BMP which showed sodium 132 which corrects to 130 3134 considering glucose of 172, potassium 3.2, bicarb 16, BUN 29, creatinine 1.39 which is near baseline of 1.15.  Calcium 8.4.  CBC with leukocytosis to 12.5, hemoglobin stable at 10.8.  CRP elevated at 20.1.  X-ray of the left foot showed no evidence of osteomyelitis based on x-ray findings and no gas in the soft tissues.  Also no other acute abnormalities were noted on x-ray.  Patient received Dilaudid in the ED.  As well as a liter of fluids.  Vancomycin and Zosyn have been ordered as well as 20 mEq of p.o. potassium.  Review of Systems: As per HPI otherwise all other systems reviewed and are negative.  Past Medical History:  Diagnosis Date   Anxiety     CAD (coronary artery disease)    sees Dr. Stanford Breed, normal Stress test 07-16-12    Diabetes mellitus    DJD (degenerative joint disease)    Gout    Hyperlipidemia    Hypertension    Hypothyroid    Osteoarthritis    Renal calculus    hx   Umbilical hernia     Past Surgical History:  Procedure Laterality Date   AMPUTATION  07/17/2012   Procedure: AMPUTATION RAY;  Surgeon: Wylene Simmer, MD;  Location: [place];  Service: Orthopedics;  Laterality: Right;  right hallux amputation (1st ray resection )   AMPUTATION Left 03/06/2019   Procedure: Ray Amputation left foot;  Surgeon: Nicholes Stairs, MD;  Location: [facility];  Service: Orthopedics;  Laterality: Left;   CORONARY ANGIOPLASTY WITH STENT PLACEMENT     FINGER SURGERY     left index finger   I & D EXTREMITY  05/19/2012   Procedure: IRRIGATION AND DEBRIDEMENT EXTREMITY;  Surgeon: Marin Shutter, MD;  Location: [place];  Service: Orthopedics;  Laterality: Right;  Right Great toe   I & D Toe  05/19/2012   rt great toe   TONSILLECTOMY     TRANSMETATARSAL AMPUTATION Left 12/10/2020   Procedure: TRANSMETATARSAL AMPUTATION LEFT;  Surgeon: Newt Minion, MD;  Location: [place];  Service: Orthopedics;  Laterality: Left;    Social History  reports that he has never smoked. His smokeless tobacco use includes chew. He reports  current alcohol use. He reports that he does not use drugs.  No Known Allergies  Family History  Problem Relation Age of Onset   Heart attack Mother    Arthritis Other    Coronary artery disease Other    Diabetes Other    Hypertension Other    Prostate cancer Other    Stroke Other   Reviewed on admission  Prior to Admission medications   Medication Sig Start Date End Date Taking? Authorizing Provider  acetaminophen (TYLENOL) 325 MG tablet Take 2 tablets (650 mg total) by mouth every 6 (six) hours as needed for mild pain (or Fever >/= 101). 03/09/19   Marylyn Ishihara, [name], DO  ALPRAZolam Duanne Moron) 1 MG tablet Take 1 tablet  (1 mg total) by mouth 3 (three) times daily as needed for anxiety. 05/09/21   [name], MD  amLODipine (NORVASC) 10 MG tablet TAKE 1 TABLET (10 MG TOTAL) BY MOUTH DAILY. <PLEASE MAKE APPOINTMENT FOR REFILLS> 09/20/21   [name], MD  Aspirin-Acetaminophen-Caffeine [phone] MG PACK Take 1 Package by mouth daily as needed (pain). 07/06/15   [provider]  cloNIDine (CATAPRES) 0.3 MG tablet TAKE 1 TABLET (0.3 MG TOTAL) BY MOUTH 2 (TWO) TIMES DAILY. NEED OV. 09/20/21   [name], MD  colchicine 0.6 MG tablet TAKE 1 TABLET (0.6 MG TOTAL) BY MOUTH EVERY 6 (SIX) HOURS AS NEEDED (GOUT). 02/22/20   [name], MD  cyanocobalamin (,VITAMIN B-12,) 1000 MCG/ML injection Inject 1 mL (1,000 mcg total) into the muscle once a week. 12/19/20   [name], MD  ezetimibe (ZETIA) 10 MG tablet TAKE 1 TABLET BY MOUTH EVERY DAY Patient taking differently: Take 10 mg by mouth daily. 11/21/20   [name], MD  famotidine (PEPCID) 40 MG tablet Take 1 tablet (40 mg total) by mouth daily. 12/19/20   [name], MD  fenofibrate 160 MG tablet TAKE 1 TABLET (160 MG TOTAL) BY MOUTH DAILY. NEED OV. Patient taking differently: Take 160 mg by mouth daily. 11/21/20   [name], MD  ferrous sulfate 325 (65 FE) MG EC tablet Take 1 tablet (325 mg total) by mouth 2 (two) times daily. 12/11/20 12/11/21  Ghimire, Henreitta Leber, MD  fluticasone (FLONASE) 50 MCG/ACT nasal spray Place 2 sprays into both nostrils daily. 08/13/20   Hassell Done Mary-Margaret, FNP  glipiZIDE (GLUCOTROL) 5 MG tablet TAKE 1 TABLET BY MOUTH TWICE A DAY 01/03/21   [name], MD  HYDROcodone-acetaminophen [facility]) 10-325 MG tablet Take 1 tablet by mouth every 6 (six) hours as needed for moderate pain. 10/27/21 11/26/21  [name], MD  indomethacin (INDOCIN SR) 75 MG CR capsule Take 1 capsule (75 mg total) by mouth daily as needed for moderate pain. 12/11/20   Ghimire, Henreitta Leber, MD  levothyroxine (SYNTHROID) 112 MCG tablet TAKE 1 TABLET  BY MOUTH EVERY DAY 09/20/21   [name], MD  lisinopril-hydrochlorothiazide (ZESTORETIC) 20-12.5 MG tablet TAKE 1 TABLET BY MOUTH 2 (TWO) TIMES DAILY. 10AM AND 5PM 09/20/21   [name], MD  metFORMIN (GLUCOPHAGE) 1000 MG tablet TAKE 1 TABLET BY MOUTH TWO TIMES A DAY WITH A MEAL 07/31/21   [name], MD  metoprolol succinate (TOPROL-XL) 100 MG 24 hr tablet TAKE 1 TABLET BY MOUTH TWICE A DAY 09/20/21   [name], MD  mupirocin ointment (BACTROBAN) 2 % Apply 1 application topically 2 (two) times daily. 12/19/20   [name], MD  nitroGLYCERIN (  NITROSTAT) 0.4 MG SL tablet Place 1 tablet (0.4 mg total) under the tongue every 5 (five) minutes as needed. For chest pain. Patient taking differently: Place 0.4 mg under the tongue every 5 (five) minutes as needed for chest pain. 05/21/18   Lelon Perla, MD  omeprazole (PRILOSEC) 40 MG capsule TAKE 1 CAPSULE (40 MG TOTAL) BY MOUTH DAILY. 09/20/21   [name], MD  [facility] ULTRA test strip 1 EACH BY OTHER ROUTE DAILY. USE AS INSTRUCTED 06/16/21   [name], MD  polyethylene glycol (MIRALAX) 17 g packet Take 17 g by mouth daily as needed. 12/11/20   Ghimire, Henreitta Leber, MD  rosuvastatin (CRESTOR) 40 MG tablet Take 1 tablet (40 mg total) by mouth daily. 02/28/21   [name], MD  Syringe/Needle, Disp, (SYRINGE 3CC/25GX1") 25G X 1" 3 ML MISC 1 application by Does not apply route once a week. 01/02/21   [name], MD  zolpidem (AMBIEN) 10 MG tablet TAKE 1 TABLET BY MOUTH AT BEDTIME AS NEEDED FOR SLEEP 08/22/21   [name], MD    Physical Exam: Vitals:   10/22/21 1606 10/22/21 1630 10/22/21 1700 10/22/21 1730  BP:  112/64 113/62 118/60  Pulse:  66 65 64  Resp:  _0 Temp:      TempSrc:      SpO2:  98% 99% 98%  Weight: 90.7 kg     Height: 6' (1.829 m)       Physical Exam Constitutional:      General: He is not in acute distress.    Appearance: Normal appearance.  HENT:     Head: Normocephalic and atraumatic.      Mouth/Throat:     Mouth: Mucous membranes are moist.     Pharynx: Oropharynx is clear.  Eyes:     Extraocular Movements: Extraocular movements intact.     Pupils: Pupils are equal, round, and reactive to light.  Cardiovascular:     Rate and Rhythm: Normal rate and regular rhythm.     Pulses: Normal pulses.     Heart sounds: Normal heart sounds.  Pulmonary:     Effort: Pulmonary effort is normal. No respiratory distress.     Breath sounds: Normal breath sounds.  Abdominal:     General: Bowel sounds are normal. There is no distension.     Palpations: Abdomen is soft.     Tenderness: There is no abdominal tenderness.  Musculoskeletal:        General: Swelling and tenderness present.     Comments: Left foot with edema, erythema, warmth, blistering.  Status post ray amputation with well-healed incision site. Status post right great toe amputation.  Skin:    General: Skin is warm and dry.  Neurological:     General: No focal deficit present.     Mental Status: Mental status is at baseline.   Labs on Admission: I have personally reviewed following labs and imaging studies  CBC: Recent Labs  Lab 10/22/21 1710  WBC 12.5*  NEUTROABS 10.3*  HGB 10.8*  HCT 33.4*  MCV 89.8  PLT 299    Basic Metabolic Panel: Recent Labs  Lab 10/22/21 1710  NA 132*  K 3.2*  CL 101  CO2 16*  GLUCOSE 172*  BUN 29*  CREATININE 1.39*  CALCIUM 8.4*    GFR: Estimated Creatinine Clearance: 61.3 mL/min (A) (by C-G formula based on SCr of 1.39 mg/dL (H)).  Liver Function Tests: No results for input(s): AST,  ALKPHOS, BILITOT, PROT, ALBUMIN in the last 168 hours. ° °Urine analysis: °   °Component Value Date/Time  ° COLORURINE COLORLESS (A) 12/08/2020 2350  ° APPEARANCEUR CLEAR 12/08/2020 2350  ° LABSPEC 1.009 12/08/2020 2350  ° PHURINE 5.0 12/08/2020 2350  ° GLUCOSEU NEGATIVE 12/08/2020 2350  ° HGBUR NEGATIVE 12/08/2020 2350  ° HGBUR negative 04/16/2007 0904  ° BILIRUBINUR NEGATIVE  12/08/2020 2350  ° BILIRUBINUR neg 11/13/2019 1527  ° KETONESUR NEGATIVE 12/08/2020 2350  ° PROTEINUR NEGATIVE 12/08/2020 2350  ° UROBILINOGEN 0.2 11/13/2019 1527  ° UROBILINOGEN negative 04/16/2007 0904  ° NITRITE NEGATIVE 12/08/2020 2350  ° LEUKOCYTESUR NEGATIVE 12/08/2020 2350  ° ° °Radiological Exams on Admission: °DG Foot Complete Left ° °Result Date: 10/22/2021 °CLINICAL DATA:  Diabetic foot infection. Right foot pain. Previous amputations. EXAM: LEFT FOOT - COMPLETE 3+ VIEW COMPARISON:  12/08/2020 FINDINGS: Previous amputations of all digits are seen at the level of the proximal metatarsals. No evidence of osteolysis or periostitis to suggest the presence of osteomyelitis. No evidence of fracture or dislocation. No evidence of soft tissue gas. Prominent plantar calcaneal bone spur noted. IMPRESSION: No radiographic evidence of osteomyelitis, soft tissue gas, or other acute findings. Electronically Signed   By: John A Stahl M.D.   On: 10/22/2021 16:53   ° °EKG: Not performed in the ED ° °Assessment/Plan °Principal Problem: °  Diabetic foot infection (HCC) °Active Problems: °  Hypothyroidism °  Dyslipidemia °  Anxiety state °  Essential hypertension °  Coronary atherosclerosis °  Diabetes mellitus with complication (HCC) °  Hypokalemia °  History of complete ray amputation of fifth toe of left foot (HCC) °  Status post amputation of right great toe (HCC) °  Iron deficiency anemia °  CKD (chronic kidney disease) stage 2, GFR 60-89 ml/min °  Hyponatremia °  °Diabetic foot infection °Status post left ray amputation °Status post right great toe amputation °> Patient presenting with increasing pain in his left foot.  Noted blistering today in the shower. °> Patient with history of osteomyelitis status post ray amputation in the left foot now with erythema, redness, warmth of foot with blistering. °> No x-ray evidence of osteomyelitis nor gas in the soft tissues. °> Leukocytosis to 12.5, CRP elevated 20.1.  ESR and  blood cultures pending. °> Vancomycin and Zosyn were ordered in the ED. °> Has followed with [facility] in the past. °- Monitor on telemetry °- Continue with vancomycin °- Will change Zosyn to cefepime and Flagyl considering patient's decreased renal function °- Trend CRP °- Follow-up ESR °- MRSA screen °- Continue home as needed Norco ° °CKD 2 °Hypokalemia °Hyponatremia °> Presented with potassium of 3.2.  Creatinine at 1.39 which is mildly elevated from baseline around 1.15 but not significant enough to indicate AKI. °> Mild hyponatremia at 132 which corrects to 133 or 134 based on glucose of 172. °> 20 mEq p.o. potassium ordered in the ED. 1L IVF ordered in the Ed as well. °- We will order additional 40 mEq p.o. potassium °- Check magnesium °- Avoid nephrotoxic agents °- Trend renal function and electrolytes ° °Anemia °> Hemoglobin stable at 10.8 in the ED. °- Continue to trend CBC ° °Hypothyroidism °- Continue home Synthroid ° °Hypertension °- Continue home amlodipine, clonidine, senna pro-hydrochlorothiazide, metoprolol ° °CAD °Hyperlipidemia °- Continue home Zetia, fenofibrate, rosuvastatin ° °Diabetes °- SSI ° °Anxiety °- Continue as needed Xanax ° °DVT prophylaxis: Lovenox °Code Status:   Full °Family Communication:  None on admission.  Patient   Patient states he has been in contact with his family and they are up-to-date. Disposition Plan:   Patient is from:  Home  Anticipated DC to:  Home  Anticipated DC date:  1 to 3 days  Anticipated DC barriers: None  Consults called:  None Admission status:  Observation, telemetry  Severity of Illness: The appropriate patient status for this patient is OBSERVATION. Observation status is judged to be reasonable and necessary in order to provide the required intensity of service to ensure the patient's safety. The patient's presenting symptoms, physical exam findings, and initial radiographic and laboratory data in the context of their medical condition is felt to  place them at decreased risk for further clinical deterioration. Furthermore, it is anticipated that the patient will be medically stable for discharge from the hospital within 2 midnights of admission.    Marcelyn Bruins MD [facility]  How to contact the [facility] Attending or Consulting provider [place] or covering provider during after hours [place], for this patient?   Check the care team in [facility] and look for a) attending/consulting [facility] provider listed and b) the [facility] team listed Log into [email or web address] and use [place]'s universal password to access. If you do not have the password, please contact the hospital operator. Locate the [facility] provider you are looking for under [facility] and page to a number that you can be directly reached. If you still have difficulty reaching the provider, please page the [facility] (Director on Call) for the Hospitalists listed on amion for assistance.  10/22/2021, 6:52 PM

## 2021-10-22 NOTE — ED Notes (Signed)
Pt reports pain in his right foot. He has all of his toes amputated on that foot. His pain started Wednesday and has been getting worse. When he was showering today, he said "it looks like it's going to fall off."

## 2021-10-22 NOTE — ED Provider Notes (Signed)
[facility] EMERGENCY DEPARTMENT Provider Note   CSN: [id number] Arrival date & time: 10/22/21  1544     History  Chief Complaint  Patient presents with   Foot Pain    Kendahl Denino Hurry is a 61 y.o. male.  He has a history of diabetes and had a transmetatarsal amputation of his left foot about a year ago.  He said it bothered him on and off since then but worsened over the last week.  Increased pain and new blistering on the sole of his foot and medial aspect.  No fevers but has had chills.  Nausea and one episode of vomiting.  He has a history of MRSA.  The history is provided by the patient.  Foot Pain This is a recurrent problem. The current episode started more than 2 days ago. The problem occurs constantly. The problem has not changed since onset.Pertinent negatives include no chest pain, no abdominal pain, no headaches and no shortness of breath. The symptoms are aggravated by walking. Nothing relieves the symptoms. He has tried rest for the symptoms. The treatment provided no relief.      Home Medications Prior to Admission medications   Medication Sig Start Date End Date Taking? Authorizing Provider  acetaminophen (TYLENOL) 325 MG tablet Take 2 tablets (650 mg total) by mouth every 6 (six) hours as needed for mild pain (or Fever >/= 101). 03/09/19   Marylyn Ishihara, [name], DO  ALPRAZolam Duanne Moron) 1 MG tablet Take 1 tablet (1 mg total) by mouth 3 (three) times daily as needed for anxiety. 05/09/21   [name], MD  amLODipine (NORVASC) 10 MG tablet TAKE 1 TABLET (10 MG TOTAL) BY MOUTH DAILY. <PLEASE MAKE APPOINTMENT FOR REFILLS> 09/20/21   [name], MD  Aspirin-Acetaminophen-Caffeine [phone] MG PACK Take 1 Package by mouth daily as needed (pain). 07/06/15   [provider]  cloNIDine (CATAPRES) 0.3 MG tablet TAKE 1 TABLET (0.3 MG TOTAL) BY MOUTH 2 (TWO) TIMES DAILY. NEED OV. 09/20/21   [name], MD  colchicine 0.6 MG tablet TAKE 1 TABLET (0.6  MG TOTAL) BY MOUTH EVERY 6 (SIX) HOURS AS NEEDED (GOUT). 02/22/20   [name], MD  cyanocobalamin (,VITAMIN B-12,) 1000 MCG/ML injection Inject 1 mL (1,000 mcg total) into the muscle once a week. 12/19/20   [name], MD  ezetimibe (ZETIA) 10 MG tablet TAKE 1 TABLET BY MOUTH EVERY DAY Patient taking differently: Take 10 mg by mouth daily. 11/21/20   [name], MD  famotidine (PEPCID) 40 MG tablet Take 1 tablet (40 mg total) by mouth daily. 12/19/20   [name], MD  fenofibrate 160 MG tablet TAKE 1 TABLET (160 MG TOTAL) BY MOUTH DAILY. NEED OV. Patient taking differently: Take 160 mg by mouth daily. 11/21/20   [name], MD  ferrous sulfate 325 (65 FE) MG EC tablet Take 1 tablet (325 mg total) by mouth 2 (two) times daily. 12/11/20 12/11/21  Ghimire, Henreitta Leber, MD  fluticasone (FLONASE) 50 MCG/ACT nasal spray Place 2 sprays into both nostrils daily. 08/13/20   Hassell Done Mary-Margaret, FNP  glipiZIDE (GLUCOTROL) 5 MG tablet TAKE 1 TABLET BY MOUTH TWICE A DAY 01/03/21   [name], MD  HYDROcodone-acetaminophen [facility]) 10-325 MG tablet Take 1 tablet by mouth every 6 (six) hours as needed for moderate pain. 10/27/21 11/26/21  [name], MD  indomethacin (INDOCIN SR) 75 MG CR capsule Take 1 capsule (75 mg total) by mouth  daily as needed for moderate pain. 12/11/20   Ghimire, Henreitta Leber, MD  levothyroxine (SYNTHROID) 112 MCG tablet TAKE 1 TABLET BY MOUTH EVERY DAY 09/20/21   [name], MD  lisinopril-hydrochlorothiazide (ZESTORETIC) 20-12.5 MG tablet TAKE 1 TABLET BY MOUTH 2 (TWO) TIMES DAILY. 10AM AND 5PM 09/20/21   [name], MD  metFORMIN (GLUCOPHAGE) 1000 MG tablet TAKE 1 TABLET BY MOUTH TWO TIMES A DAY WITH A MEAL 07/31/21   [name], MD  metoprolol succinate (TOPROL-XL) 100 MG 24 hr tablet TAKE 1 TABLET BY MOUTH TWICE A DAY 09/20/21   [name], MD  mupirocin ointment (BACTROBAN) 2 % Apply 1 application topically 2 (two) times daily. 12/19/20   [name], MD   nitroGLYCERIN (NITROSTAT) 0.4 MG SL tablet Place 1 tablet (0.4 mg total) under the tongue every 5 (five) minutes as needed. For chest pain. Patient taking differently: Place 0.4 mg under the tongue every 5 (five) minutes as needed for chest pain. 05/21/18   Lelon Perla, MD  omeprazole (PRILOSEC) 40 MG capsule TAKE 1 CAPSULE (40 MG TOTAL) BY MOUTH DAILY. 09/20/21   [name], MD  [facility] ULTRA test strip 1 EACH BY OTHER ROUTE DAILY. USE AS INSTRUCTED 06/16/21   [name], MD  polyethylene glycol (MIRALAX) 17 g packet Take 17 g by mouth daily as needed. 12/11/20   Ghimire, Henreitta Leber, MD  rosuvastatin (CRESTOR) 40 MG tablet Take 1 tablet (40 mg total) by mouth daily. 02/28/21   [name], MD  Syringe/Needle, Disp, (SYRINGE 3CC/25GX1") 25G X 1" 3 ML MISC 1 application by Does not apply route once a week. 01/02/21   [name], MD  zolpidem (AMBIEN) 10 MG tablet TAKE 1 TABLET BY MOUTH AT BEDTIME AS NEEDED FOR SLEEP 08/22/21   [name], MD      Allergies    Patient has no known allergies.    Review of Systems   Review of Systems  Constitutional:  Positive for chills. Negative for fever.  Respiratory:  Negative for shortness of breath.   Cardiovascular:  Negative for chest pain.  Gastrointestinal:  Positive for nausea and vomiting. Negative for abdominal pain.  Skin:  Positive for wound.  Neurological:  Negative for headaches.   Physical Exam Updated Vital Signs BP 128/63    Pulse 72    Temp 98.5 F (36.9 C) (Oral)    Resp 14    Ht 6' (1.829 m)    Wt 90.7 kg    SpO2 100%    BMI 27.12 kg/m  Physical Exam Vitals and nursing note reviewed.  Constitutional:      General: He is not in acute distress.    Appearance: Normal appearance. He is well-developed.  HENT:     Head: Normocephalic and atraumatic.  Eyes:     Conjunctiva/sclera: Conjunctivae normal.  Cardiovascular:     Rate and Rhythm: Normal rate and regular rhythm.     Heart sounds: No murmur  heard. Pulmonary:     Effort: Pulmonary effort is normal. No respiratory distress.     Breath sounds: Normal breath sounds.  Abdominal:     Palpations: Abdomen is soft.     Tenderness: There is no abdominal tenderness.  Musculoskeletal:        General: Swelling and tenderness present. Normal range of motion.     Cervical back: Neck supple.     Comments: Left foot old surgical wound for transmetatarsal amputation is  nontender without signs of infection.  There is a chronic ulceration on the sole of his foot.  There is a ruptured blister at the plantar arch that wraps around to the medial side and 2 hemorrhagic blisters with surrounding erythema.  The area is diffusely tender.  There is minimal ascending cellulitis.  Skin:    General: Skin is warm and dry.     Capillary Refill: Capillary refill takes less than 2 seconds.  Neurological:     Mental Status: He is alert. Mental status is at baseline.     Sensory: Sensory deficit present.     Motor: No weakness.     Comments: He has decreased sensation stocking to left lower extremity.     ED Results / Procedures / Treatments   Labs (all labs ordered are listed, but only abnormal results are displayed) Labs Reviewed  BASIC METABOLIC PANEL - Abnormal; Notable for the following components:      Result Value   Sodium 132 (*)    Potassium 3.2 (*)    CO2 16 (*)    Glucose, Bld 172 (*)    BUN 29 (*)    Creatinine, Ser 1.39 (*)    Calcium 8.4 (*)    GFR, Estimated 58 (*)    All other components within normal limits  CBC WITH DIFFERENTIAL/PLATELET - Abnormal; Notable for the following components:   WBC 12.5 (*)    RBC 3.72 (*)    Hemoglobin 10.8 (*)    HCT 33.4 (*)    Neutro Abs 10.3 (*)    Monocytes Absolute 1.2 (*)    Abs Immature Granulocytes 0.08 (*)    All other components within normal limits  SEDIMENTATION RATE - Abnormal; Notable for the following components:   Sed Rate >140 (*)    All other components within normal limits   C-REACTIVE PROTEIN - Abnormal; Notable for the following components:   CRP 20.1 (*)    All other components within normal limits  MAGNESIUM - Abnormal; Notable for the following components:   Magnesium 1.4 (*)    All other components within normal limits  COMPREHENSIVE METABOLIC PANEL - Abnormal; Notable for the following components:   CO2 20 (*)    Glucose, Bld 220 (*)    BUN 28 (*)    Creatinine, Ser 1.30 (*)    Calcium 7.8 (*)    Total Protein 5.8 (*)    Albumin 2.1 (*)    AST 9 (*)    All other components within normal limits  CBC - Abnormal; Notable for the following components:   RBC 3.14 (*)    Hemoglobin 9.0 (*)    HCT 27.9 (*)    All other components within normal limits  MAGNESIUM - Abnormal; Notable for the following components:   Magnesium 1.4 (*)    All other components within normal limits  CBG MONITORING, ED - Abnormal; Notable for the following components:   Glucose-Capillary 149 (*)    All other components within normal limits  CULTURE, BLOOD (ROUTINE X 2)  CULTURE, BLOOD (ROUTINE X 2)  MRSA NEXT GEN BY PCR, NASAL  RESP PANEL BY RT-PCR (FLU A&B, COVID) [id number]    EKG None  Radiology DG Foot Complete Left  Result Date: 10/22/2021 CLINICAL DATA:  Diabetic foot infection. Right foot pain. Previous amputations. EXAM: LEFT FOOT - COMPLETE 3+ VIEW COMPARISON:  12/08/2020 FINDINGS: Previous amputations of all digits are seen at the level of the proximal metatarsals. No evidence of osteolysis or  periostitis to suggest the presence of osteomyelitis. No evidence of fracture or dislocation. No evidence of soft tissue gas. Prominent plantar calcaneal bone spur noted. IMPRESSION: No radiographic evidence of osteomyelitis, soft tissue gas, or other acute findings. Electronically Signed   By: Marlaine Hind M.D.   On: 10/22/2021 16:53    Procedures Procedures    Medications Ordered in ED Medications  metroNIDAZOLE (FLAGYL) IVPB 500 mg (500 mg Intravenous New Bag/Given  10/23/21 0601)  amLODipine (NORVASC) tablet 10 mg (10 mg Oral Given 10/23/21 0851)  cloNIDine (CATAPRES) tablet 0.3 mg (0.3 mg Oral Given 10/23/21 0850)  ezetimibe (ZETIA) tablet 10 mg (10 mg Oral Given 10/23/21 0850)  fenofibrate tablet 160 mg (160 mg Oral Given 10/23/21 0850)  rosuvastatin (CRESTOR) tablet 40 mg (40 mg Oral Given 10/23/21 0849)  ALPRAZolam (XANAX) tablet 1 mg (1 mg Oral Given 10/22/21 2117)  levothyroxine (SYNTHROID) tablet 112 mcg (112 mcg Oral Given 10/23/21 0851)  pantoprazole (PROTONIX) EC tablet 40 mg (40 mg Oral Given 10/23/21 0849)  enoxaparin (LOVENOX) injection 40 mg (40 mg Subcutaneous Given 10/22/21 2102)  sodium chloride flush (NS) 0.9 % injection 3 mL (3 mLs Intravenous Given 10/22/21 2104)  acetaminophen (TYLENOL) tablet 650 mg (has no administration in time range)    Or  acetaminophen (TYLENOL) suppository 650 mg (has no administration in time range)  polyethylene glycol (MIRALAX / GLYCOLAX) packet 17 g (has no administration in time range)  ceFEPIme (MAXIPIME) 2 g in sodium chloride 0.9 % 100 mL IVPB (2 g Intravenous New Bag/Given 10/23/21 0418)  potassium chloride (KLOR-CON) packet 40 mEq (40 mEq Oral Given 10/23/21 0851)  metoprolol succinate (TOPROL-XL) 24 hr tablet 100 mg (100 mg Oral Given 10/23/21 0850)  HYDROcodone-acetaminophen (NORCO) 10-325 MG per tablet 1 tablet (1 tablet Oral Given 10/23/21 0857)  vancomycin (VANCOREADY) IVPB 1750 mg/350 mL (has no administration in time range)  magnesium sulfate IVPB 2 g 50 mL (has no administration in time range)  HYDROmorphone (DILAUDID) injection 1 mg (1 mg Intravenous Given 10/22/21 1736)  sodium chloride 0.9 % bolus 1,000 mL (1,000 mLs Intravenous New Bag/Given 10/23/21 0051)  potassium chloride SA (KLOR-CON M) CR tablet 20 mEq (20 mEq Oral Given 10/22/21 1801)  vancomycin (VANCOREADY) IVPB 2000 mg/400 mL (2,000 mg Intravenous New Bag/Given 10/22/21 2131)    ED Course/ Medical Decision Making/ A&P Clinical Course as of  10/23/21 0941  Sun Oct 22, 2021  1650 X-rays of left foot ordered and interpreted by me as no acute fracture.  No clear evidence of osteomyelitis.  Awaiting radiology reading. [MB]  [id number] Discussed with Dr. Trilby Drummer from [facility] hospitalist will evaluate the patient for admission. [MB]    Clinical Course User Index [MB] Hayden Rasmussen, MD                           Medical Decision Making Amount and/or Complexity of Data Reviewed Labs: ordered. Radiology: ordered.  Risk Prescription drug management. Decision regarding hospitalization.  This patient complains of increased pain redness blistering of left foot; this involves an extensive number of treatment Options and is a complaint that carries with it a high risk of complications and morbidity. The differential includes diabetic foot infection, osteomyelitis, retained foreign body  I ordered, reviewed and interpreted labs, which included CBC with mildly low white count, hemoglobin low stable from priors, chemistries with elevated glucose elevated creatinine, magnesium low, inflammatory markers elevated, blood culture sent I ordered medication IV pain medication  and IV antibiotics and reviewed PMP when indicated. I ordered imaging studies which included left foot x-ray and I independently    visualized and interpreted imaging which showed no foreign body no evidence of osteomyelitis Previous records obtained and reviewed in epic, patient had seen Dr. Sharol Given and Dr. Doran Durand in the past for operative interventions I consulted Dr. Trilby Drummer [facility] hospitalist and discussed lab and imaging findings and discussed disposition.  Cardiac monitoring reviewed, normal sinus rhythm Social determinants considered, active smoking Critical Interventions: No significant barriers  After the interventions stated above, I reevaluated the patient and found patient's pain to be improved. Admission and further testing considered, he will need admission for IV  antibiotics and further evaluation of foot infection.          Final Clinical Impression(s) / ED Diagnoses Final diagnoses:  Diabetic foot infection [facility])    Rx / DC Orders ED Discharge Orders     None         Hayden Rasmussen, MD 10/23/21 [id number]

## 2021-10-22 NOTE — Progress Notes (Addendum)
Pt was at the ED and admitted with cellulitis. MD at ED placed orders for 1000 mls bolus of NS, Vancomycin IV, Cefepime IV, Lovenox, and Flagyl IV. None of the medications was addressed while he was in the ED. The night shift nurse in the ED was not sure if any of the medications were administered. This Probation officer called the pharmacy to get clarification and reschedule the medications. Safety zone report was filed out and charge nurse was notified

## 2021-10-23 ENCOUNTER — Inpatient Hospital Stay (HOSPITAL_COMMUNITY): Payer: No Typology Code available for payment source

## 2021-10-23 DIAGNOSIS — E1140 Type 2 diabetes mellitus with diabetic neuropathy, unspecified: Secondary | ICD-10-CM | POA: Diagnosis present

## 2021-10-23 DIAGNOSIS — I96 Gangrene, not elsewhere classified: Secondary | ICD-10-CM | POA: Diagnosis present

## 2021-10-23 DIAGNOSIS — E1169 Type 2 diabetes mellitus with other specified complication: Secondary | ICD-10-CM | POA: Diagnosis present

## 2021-10-23 DIAGNOSIS — D631 Anemia in chronic kidney disease: Secondary | ICD-10-CM | POA: Diagnosis present

## 2021-10-23 DIAGNOSIS — Z79899 Other long term (current) drug therapy: Secondary | ICD-10-CM | POA: Diagnosis not present

## 2021-10-23 DIAGNOSIS — L97529 Non-pressure chronic ulcer of other part of left foot with unspecified severity: Secondary | ICD-10-CM | POA: Diagnosis present

## 2021-10-23 DIAGNOSIS — I129 Hypertensive chronic kidney disease with stage 1 through stage 4 chronic kidney disease, or unspecified chronic kidney disease: Secondary | ICD-10-CM | POA: Diagnosis present

## 2021-10-23 DIAGNOSIS — E1165 Type 2 diabetes mellitus with hyperglycemia: Secondary | ICD-10-CM | POA: Diagnosis present

## 2021-10-23 DIAGNOSIS — E1122 Type 2 diabetes mellitus with diabetic chronic kidney disease: Secondary | ICD-10-CM | POA: Diagnosis present

## 2021-10-23 DIAGNOSIS — Z89432 Acquired absence of left foot: Secondary | ICD-10-CM | POA: Diagnosis not present

## 2021-10-23 DIAGNOSIS — M869 Osteomyelitis, unspecified: Secondary | ICD-10-CM | POA: Diagnosis present

## 2021-10-23 DIAGNOSIS — Z79890 Hormone replacement therapy (postmenopausal): Secondary | ICD-10-CM | POA: Diagnosis not present

## 2021-10-23 DIAGNOSIS — E871 Hypo-osmolality and hyponatremia: Secondary | ICD-10-CM | POA: Diagnosis present

## 2021-10-23 DIAGNOSIS — I2510 Atherosclerotic heart disease of native coronary artery without angina pectoris: Secondary | ICD-10-CM | POA: Diagnosis not present

## 2021-10-23 DIAGNOSIS — E039 Hypothyroidism, unspecified: Secondary | ICD-10-CM | POA: Diagnosis present

## 2021-10-23 DIAGNOSIS — E11621 Type 2 diabetes mellitus with foot ulcer: Secondary | ICD-10-CM | POA: Diagnosis present

## 2021-10-23 DIAGNOSIS — L02612 Cutaneous abscess of left foot: Secondary | ICD-10-CM | POA: Diagnosis present

## 2021-10-23 DIAGNOSIS — L089 Local infection of the skin and subcutaneous tissue, unspecified: Secondary | ICD-10-CM | POA: Diagnosis not present

## 2021-10-23 DIAGNOSIS — A419 Sepsis, unspecified organism: Secondary | ICD-10-CM | POA: Diagnosis not present

## 2021-10-23 DIAGNOSIS — N179 Acute kidney failure, unspecified: Secondary | ICD-10-CM | POA: Diagnosis present

## 2021-10-23 DIAGNOSIS — Z7984 Long term (current) use of oral hypoglycemic drugs: Secondary | ICD-10-CM | POA: Diagnosis not present

## 2021-10-23 DIAGNOSIS — E11628 Type 2 diabetes mellitus with other skin complications: Secondary | ICD-10-CM | POA: Diagnosis present

## 2021-10-23 DIAGNOSIS — E1152 Type 2 diabetes mellitus with diabetic peripheral angiopathy with gangrene: Secondary | ICD-10-CM | POA: Diagnosis present

## 2021-10-23 DIAGNOSIS — D509 Iron deficiency anemia, unspecified: Secondary | ICD-10-CM | POA: Diagnosis present

## 2021-10-23 DIAGNOSIS — E43 Unspecified severe protein-calorie malnutrition: Secondary | ICD-10-CM | POA: Diagnosis present

## 2021-10-23 DIAGNOSIS — Z20822 Contact with and (suspected) exposure to covid-19: Secondary | ICD-10-CM | POA: Diagnosis present

## 2021-10-23 DIAGNOSIS — L97519 Non-pressure chronic ulcer of other part of right foot with unspecified severity: Secondary | ICD-10-CM | POA: Diagnosis present

## 2021-10-23 LAB — COMPREHENSIVE METABOLIC PANEL
ALT: 10 U/L (ref 0–44)
AST: 9 U/L — ABNORMAL LOW (ref 15–41)
Albumin: 2.1 g/dL — ABNORMAL LOW (ref 3.5–5.0)
Alkaline Phosphatase: 73 U/L (ref 38–126)
Anion gap: 10 (ref 5–15)
BUN: 28 mg/dL — ABNORMAL HIGH (ref 8–23)
CO2: 20 mmol/L — ABNORMAL LOW (ref 22–32)
Calcium: 7.8 mg/dL — ABNORMAL LOW (ref 8.9–10.3)
Chloride: 105 mmol/L (ref 98–111)
Creatinine, Ser: 1.3 mg/dL — ABNORMAL HIGH (ref 0.61–1.24)
GFR, Estimated: >60 mL/min (ref >60)
Glucose, Bld: 220 mg/dL — ABNORMAL HIGH (ref 70–99)
Potassium: 3.7 mmol/L (ref 3.5–5.1)
Sodium: 135 mmol/L (ref 135–145)
Total Bilirubin: 0.3 mg/dL (ref 0.3–1.2)
Total Protein: 5.8 g/dL — ABNORMAL LOW (ref 6.5–8.1)

## 2021-10-23 LAB — MAGNESIUM: Magnesium: 1.4 mg/dL — ABNORMAL LOW (ref 1.7–2.4)

## 2021-10-23 LAB — CBC
HCT: 27.9 % — ABNORMAL LOW (ref 39.0–52.0)
Hemoglobin: 9 g/dL — ABNORMAL LOW (ref 13.0–17.0)
MCH: 28.7 pg (ref 26.0–34.0)
MCHC: 32.3 g/dL (ref 30.0–36.0)
MCV: 88.9 fL (ref 80.0–100.0)
Platelets: 223 10*3/uL (ref 150–400)
RBC: 3.14 MIL/uL — ABNORMAL LOW (ref 4.22–5.81)
RDW: 14.8 % (ref 11.5–15.5)
WBC: 9.9 10*3/uL (ref 4.0–10.5)
nRBC: 0 % (ref 0.0–0.2)

## 2021-10-23 LAB — GLUCOSE, CAPILLARY
Glucose-Capillary: 208 mg/dL — ABNORMAL HIGH (ref 70–99)
Glucose-Capillary: 156 mg/dL — ABNORMAL HIGH (ref 70–99)
Glucose-Capillary: 188 mg/dL — ABNORMAL HIGH (ref 70–99)

## 2021-10-23 MED ORDER — MAGNESIUM SULFATE 2 GM/50ML IV SOLN
2.0000 g | Freq: Once | INTRAVENOUS | Status: AC
  Administered 2021-10-23: 2 g via INTRAVENOUS
  Filled 2021-10-23: qty 50

## 2021-10-23 MED ORDER — INSULIN ASPART 100 UNIT/ML IJ SOLN
0.0000 [IU] | Freq: Three times a day (TID) | INTRAMUSCULAR | Status: DC
  Administered 2021-10-23: 5 [IU] via SUBCUTANEOUS
  Administered 2021-10-23 – 2021-10-24 (×4): 3 [IU] via SUBCUTANEOUS
  Administered 2021-10-25: 8 [IU] via SUBCUTANEOUS
  Administered 2021-10-26 (×3): 3 [IU] via SUBCUTANEOUS
  Administered 2021-10-28: 2 [IU] via SUBCUTANEOUS
  Administered 2021-10-28: 3 [IU] via SUBCUTANEOUS

## 2021-10-23 MED ORDER — GADOBUTROL 1 MMOL/ML IV SOLN
9.5000 mL | Freq: Once | INTRAVENOUS | Status: AC | PRN
  Administered 2021-10-23: 9.5 mL via INTRAVENOUS

## 2021-10-23 MED ORDER — HYDROMORPHONE HCL 1 MG/ML IJ SOLN
0.5000 mg | INTRAMUSCULAR | Status: DC | PRN
  Administered 2021-10-23 – 2021-10-24 (×4): 0.5 mg via INTRAVENOUS
  Filled 2021-10-23 (×5): qty 1

## 2021-10-23 MED ORDER — SODIUM CHLORIDE 0.9 % IV SOLN
INTRAVENOUS | Status: DC | PRN
Start: 2021-10-23 — End: 2021-10-28

## 2021-10-23 MED ORDER — INSULIN ASPART 100 UNIT/ML IJ SOLN
0.0000 [IU] | Freq: Every day | INTRAMUSCULAR | Status: DC
  Administered 2021-10-25: 3 [IU] via SUBCUTANEOUS
  Administered 2021-10-26: 2 [IU] via SUBCUTANEOUS

## 2021-10-23 NOTE — Progress Notes (Addendum)
°  Transition of Care [facility]) Screening Note   Patient Details  Name: Robert Huang Date of Birth: April 30, 1961   Transition of Care [facility]) CM/SW Contact:    Tom-Johnson, Hershal Coria, RN Phone Number: 10/23/2021, 2:03 PM  Patient is from home alone. Admitted for Diabetic left foot ulcer. Has three adult children. Currently employed with [employer]. Independent with care and drive self prior to admission. PCP is [name], MD and uses [facility] in [place]. Transition of Care Department [facility] has reviewed patient and no TOC needs or recommendations have been identified at this time. TOC will continue to monitor patient advancement through interdisciplinary progression rounds. If new patient transition needs arise, please place a TOC consult.

## 2021-10-23 NOTE — Plan of Care (Signed)
  Problem: Education: Goal: Knowledge of General Education information will improve Description Including pain rating scale, medication(s)/side effects and non-pharmacologic comfort measures Outcome: Progressing   

## 2021-10-23 NOTE — Progress Notes (Addendum)
PROGRESS NOTE    Natale Balcom [facility]  [id number] DOB: 14-Apr-1961 DOA: 10/22/2021 PCP: [name], MD   Brief Narrative:  HPI: Robert Huang is a 61 y.o. male with medical history significant of anemia, hypothyroidism, gout, hypertension, hyperlipidemia, diabetes, anxiety, osteomyelitis status post right great toe amputation and status post left ray amputation presenting with left foot pain.  Patient states that for the past 4 days he has had some pain in his left foot.  It has been increasing over that time.  Today he noticed redness in the foot with multiple blisters in the shower and stated "looks like it is going to fall off ".  As above does have history of prior infections and osteomyelitis requiring amputation.  Also has a history of MRSA infection.  Does report chills but denies fevers.   He states that the summer he got an ulceration on the bottom of his left foot that has been very slow to heal, but does not seem infected at any point.  Additionally denies chest pain, shortness of breath, abdominal pain, constipation, diarrhea, nausea, vomiting.   ED Course: Vital signs in the ED stable.  Lab work-up included BMP which showed sodium 132 which corrects to 130 3134 considering glucose of 172, potassium 3.2, bicarb 16, BUN 29, creatinine 1.39 which is near baseline of 1.15.  Calcium 8.4.  CBC with leukocytosis to 12.5, hemoglobin stable at 10.8.  CRP elevated at 20.1.  X-ray of the left foot showed no evidence of osteomyelitis based on x-ray findings and no gas in the soft tissues.  Also no other acute abnormalities were noted on x-ray.  Patient received Dilaudid in the ED.  As well as a liter of fluids.  Vancomycin and Zosyn have been ordered as well as 20 mEq of p.o. potassium.  Assessment & Plan:   Principal Problem:   Diabetic foot infection ([place]) Active Problems:   Hypothyroidism   Dyslipidemia   Anxiety state   Essential hypertension   Coronary atherosclerosis    Diabetes mellitus with complication (HCC)   Hypokalemia   History of complete ray amputation of fifth toe of left foot (HCC)   Status post amputation of right great toe (HCC)   Iron deficiency anemia   CKD (chronic kidney disease) stage 2, GFR 60-89 ml/min   Hyponatremia  Sepsis secondary to diabetic left foot infection with previous history of left ray amputation: Patient did not have sepsis upon presentation.  Now meets criteria for sepsis based on white cells of 4.5 and fever of 100.7 that developed after admission last night.  On my examination, patient does seem to have significant infection with a large blister which is already weeping.  I consulted wound care who assessed the patient and recommended orthopedic consult for possible deep irrigation and drainage.  Ortho consulted and seen by them as well, they are pursuing MRI and Dr. Sharol Given will see either later today or tomorrow.  Patient has history of MRSA in the past.  We will continue cefepime and vancomycin.   Hypomagnesemia: We will replace.  AKI: Patient had mild AKI which resolved.  Please note that patient does not have CKD as mentioned in previous notes.  Hypokalemia: Resolved.  Hyponatremia: Resolved.  Anemia of chronic disease: Hemoglobin is stable.  Hypothyroidism: Continue Synthroid.  Essential hypertension: Blood pressure controlled.  Continue home amlodipine, clonidine, senna pro-hydrochlorothiazide, metoprolol   CAD/hyperlipidemia: Asymptomatic. - Continue home Zetia, fenofibrate, rosuvastatin  Diabetes melitis type II: Appears to be taking metformin  at home which is on hold.  We will check hemoglobin A1c and start on SSI.  Anxiety - Continue as needed Xanax      DVT prophylaxis: enoxaparin (LOVENOX) injection 40 mg Start: 10/22/21 1830   Code Status: Full Code  Family Communication:  None present at bedside.  Plan of care discussed with patient in length and he/she verbalized understanding and agreed with  it.  Status is: Observation The patient will require care spanning > 2 midnights and should be moved to inpatient because: Will need assessment by orthopedics and perhaps some sort of procedure.         Estimated body mass index is 27.12 kg/m as calculated from the following:   Height as of this encounter: 6' (1.829 m).   Weight as of this encounter: 90.7 kg.    Nutritional Assessment: Body mass index is 27.12 kg/m.[name] Seen by dietician.  I agree with the assessment and plan as outlined below: Nutrition Status:        . Skin Assessment: I have examined the patient's skin and I agree with the wound assessment as performed by the wound care RN as outlined below:    Consultants:  Orthopedics  Procedures:  None  Antimicrobials:  Anti-infectives (From admission, onward)    Start     Dose/Rate Route Frequency Ordered Stop   10/23/21 2200  vancomycin (VANCOREADY) IVPB 1750 mg/350 mL        1,750 mg 175 mL/hr over 120 Minutes Intravenous Every 24 hours 10/22/21 2124     10/22/21 1845  ceFEPIme (MAXIPIME) 2 g in sodium chloride 0.9 % 100 mL IVPB        2 g 200 mL/hr over 30 Minutes Intravenous Every 8 hours 10/22/21 1838     10/22/21 1830  metroNIDAZOLE (FLAGYL) IVPB 500 mg        500 mg 100 mL/hr over 60 Minutes Intravenous Every 12 hours 10/22/21 1823     10/22/21 1815  vancomycin (VANCOREADY) IVPB 2000 mg/400 mL        2,000 mg 200 mL/hr over 120 Minutes Intravenous  Once 10/22/21 1805 10/22/21 2331   10/22/21 1800  piperacillin-tazobactam (ZOSYN) IVPB 3.375 g  Status:  Discontinued        3.375 g 100 mL/hr over 30 Minutes Intravenous  Once 10/22/21 1756 10/22/21 1823         Subjective: Patient seen and examined.  Complains of pain in the left foot.  No other complaint.  He is afraid to lose his left foot.  Objective: Vitals:   10/22/21 2349 10/23/21 0355 10/23/21 0847 10/23/21 0953  BP: (!) 113/56 (!) 108/56 136/69 126/66  Pulse: 67 (!) 56 (!) 58 62   Resp: 16 18 17 17   Temp: 98.3 F (36.8 C) 98.7 F (37.1 C) 98.4 F (36.9 C) 98.4 F (36.9 C)  TempSrc: Oral Oral Oral Oral  SpO2: 100% 98% 100% 99%  Weight:      Height:        Intake/Output Summary (Last 24 hours) at 10/23/2021 1316 Last data filed at 10/23/2021 1222 Gross per 24 hour  Intake 480 ml  Output 980 ml  Net -500 ml   Filed Weights   10/22/21 1606  Weight: 90.7 kg    Examination:  General exam: Appears calm and comfortable  Respiratory system: Clear to auscultation. Respiratory effort normal. Cardiovascular system: S1 & S2 heard, RRR. No JVD, murmurs, rubs, gallops or clicks. No pedal edema. Gastrointestinal system: Abdomen is nondistended,  soft and nontender. No organomegaly or masses felt. Normal bowel sounds heard. Central nervous system: Alert and oriented. No focal neurological deficits. Extremities: Symmetric 5 x 5 power. Skin: Large area of bruise and infection/blister on the left foot as seen above. Psychiatry: Judgement and insight appear normal. Mood & affect appropriate.    Data Reviewed: I have personally reviewed following labs and imaging studies  CBC: Recent Labs  Lab 10/22/21 1710 10/23/21 0354  WBC 12.5* 9.9  NEUTROABS 10.3*  --   HGB 10.8* 9.0*  HCT 33.4* 27.9*  MCV 89.8 88.9  PLT 277 [id number]   Basic Metabolic Panel: Recent Labs  Lab 10/22/21 1710 10/23/21 0354  NA 132* 135  K 3.2* 3.7  CL 101 105  CO2 16* 20*  GLUCOSE 172* 220*  BUN 29* 28*  CREATININE 1.39* 1.30*  CALCIUM 8.4* 7.8*  MG 1.4* 1.4*   GFR: Estimated Creatinine Clearance: 65.5 mL/min (A) (by C-G formula based on SCr of 1.3 mg/dL (H)). Liver Function Tests: Recent Labs  Lab 10/23/21 0354  AST 9*  ALT 10  ALKPHOS 73  BILITOT 0.3  PROT 5.8*  ALBUMIN 2.1*   No results for input(s): LIPASE, AMYLASE in the last 168 hours. No results for input(s): AMMONIA in the last 168 hours. Coagulation Profile: No results for input(s): INR, PROTIME in the last 168  hours. Cardiac Enzymes: No results for input(s): CKTOTAL, CKMB, CKMBINDEX, TROPONINI in the last 168 hours. BNP (last 3 results) No results for input(s): PROBNP in the last 8760 hours. HbA1C: No results for input(s): HGBA1C in the last 72 hours. CBG: Recent Labs  Lab 10/22/21 1556 10/23/21 1133  GLUCAP 149* 208*   Lipid Profile: No results for input(s): CHOL, HDL, LDLCALC, TRIG, CHOLHDL, LDLDIRECT in the last 72 hours. Thyroid Function Tests: No results for input(s): TSH, T4TOTAL, FREET4, T3FREE, THYROIDAB in the last 72 hours. Anemia Panel: No results for input(s): VITAMINB12, FOLATE, FERRITIN, TIBC, IRON, RETICCTPCT in the last 72 hours. Sepsis Labs: No results for input(s): PROCALCITON, LATICACIDVEN in the last 168 hours.  Recent Results (from the past 240 hour(s))  Culture, blood (routine x 2)     Status: None (Preliminary result)   Collection Time: 10/22/21  4:27 PM   Specimen: BLOOD  Result Value Ref Range Status   Specimen Description BLOOD LEFT ANTECUBITAL  Final   Special Requests   Final    BOTTLES DRAWN AEROBIC AND ANAEROBIC Blood Culture adequate volume   Culture   Final    NO GROWTH < 24 HOURS Performed at [facility], [address], [place], [place] [address]    Report Status PENDING  Incomplete  Culture, blood (routine x 2)     Status: None (Preliminary result)   Collection Time: 10/22/21  5:30 PM   Specimen: BLOOD  Result Value Ref Range Status   Specimen Description BLOOD LEFT ANTECUBITAL  Final   Special Requests   Final    BOTTLES DRAWN AEROBIC AND ANAEROBIC Blood Culture results may not be optimal due to an excessive volume of blood received in culture bottles   Culture   Final    NO GROWTH < 24 HOURS Performed at [facility], [place] [address], [place], [place] [address]    Report Status PENDING  Incomplete  MRSA Next Gen by PCR, Nasal     Status: None   Collection Time: 10/22/21  9:22 PM   Specimen: Nasal Mucosa; Nasal Swab  Result  Value Ref Range Status  MRSA by PCR Next Gen NOT DETECTED NOT DETECTED Final    Comment: (NOTE) The GeneXpert MRSA Assay (FDA approved for NASAL specimens only), is one component of a comprehensive MRSA colonization surveillance program. It is not intended to diagnose MRSA infection nor to guide or monitor treatment for MRSA infections. Test performance is not FDA approved in patients less than 53 years old. Performed at [facility], [place] [address] [place], [place] [address]      Radiology Studies: DG Foot Complete Left  Result Date: 10/22/2021 CLINICAL DATA:  Diabetic foot infection. Right foot pain. Previous amputations. EXAM: LEFT FOOT - COMPLETE 3+ VIEW COMPARISON:  12/08/2020 FINDINGS: Previous amputations of all digits are seen at the level of the proximal metatarsals. No evidence of osteolysis or periostitis to suggest the presence of osteomyelitis. No evidence of fracture or dislocation. No evidence of soft tissue gas. Prominent plantar calcaneal bone spur noted. IMPRESSION: No radiographic evidence of osteomyelitis, soft tissue gas, or other acute findings. Electronically Signed   By: Marlaine Hind M.D.   On: 10/22/2021 16:53    Scheduled Meds:  amLODipine  10 mg Oral Daily   cloNIDine  0.3 mg Oral BID   enoxaparin (LOVENOX) injection  40 mg Subcutaneous Q24H   ezetimibe  10 mg Oral Daily   fenofibrate  160 mg Oral Daily   insulin aspart  0-15 Units Subcutaneous TID WC   insulin aspart  0-5 Units Subcutaneous QHS   levothyroxine  112 mcg Oral Daily   metoprolol succinate  100 mg Oral BID   pantoprazole  40 mg Oral Daily   potassium chloride  40 mEq Oral BID   rosuvastatin  40 mg Oral Daily   sodium chloride flush  3 mL Intravenous Q12H   Continuous Infusions:  ceFEPime (MAXIPIME) IV 2 g (10/23/21 1229)   metronidazole 500 mg (10/23/21 0601)   vancomycin       LOS: 0 days   Time spent: 35 minutes  Darliss Cheney, MD [facility]  10/23/2021, 1:16 PM   Please page via Amion and do not message via secure chat for urgent patient care matters. Secure chat can be used for non urgent patient care matters.  How to contact the [facility] Attending or Consulting provider [place] or covering provider during after hours [place], for this patient?  Check the care team in [facility] and look for a) attending/consulting [facility] provider listed and b) the [facility] team listed. Page or secure chat [phone]. Log into [email or web address] and use [place]'s universal password to access. If you do not have the password, please contact the hospital operator. Locate the [facility] provider you are looking for under [facility] and page to a number that you can be directly reached. If you still have difficulty reaching the provider, please page the [facility] (Director on Call) for the Hospitalists listed on amion for assistance.

## 2021-10-23 NOTE — Progress Notes (Signed)
°   10/23/21 1100  Clinical Encounter Type  Visited With Patient  Visit Type Initial  Referral From Nurse (Driss Zine, RN)  Consult/Referral To [place] met with [name] at bed side regarding the completion of Advance Directive. Patient acknowledged that preparing A.D. was something important that he would like to have completed. Chaplain explained that A.D. is only used when the patient cannot make medical decisions about medical care for himself, giving instruction to the doctors and his agents of how he wishes to be cared.   Patient stated after careful thought that he plans to name his brother as his [place].  Chaplain explained the importance of having conversation with his brother prior to naming him as his agent.    Chaplain provided patient a blank copy and provided patient instruction for the completion of Parts A and B of the A.D. Chaplain instructed patient to have the nurse contact [place] when he has completed Parts A and B, so that we can arrange for Notary and witnesses to assist with completion of Part C.    Patient was appreciative of the consultation. Chaplain will follow up with patient to arrange for witnesses and Notary assistance. Chaplain [place], [place]. may be contacted at [phone] for further assistance.

## 2021-10-23 NOTE — Consult Note (Signed)
Reason for Consult:Right foot ulcer Referring Physician: Darliss Cheney Time called: 1030 Time at bedside: [place] is an 61 y.o. male.  HPI: Robert Huang comes in with a 4d hx/o left foot pain. He was in his usual state of health though still trying to get a plantar wound on the same foot to heal when he began to have pain. It quickly became severe and soon the medial ulcer appeared. He denies fevers, chills, N/V but has had sweats.  Past Medical History:  Diagnosis Date   Anxiety    CAD (coronary artery disease)    sees Dr. Stanford Breed, normal Stress test 07-16-12    Diabetes mellitus    DJD (degenerative joint disease)    Gout    Hyperlipidemia    Hypertension    Hypothyroid    Osteoarthritis    Renal calculus    hx   Umbilical hernia     Past Surgical History:  Procedure Laterality Date   AMPUTATION  07/17/2012   Procedure: AMPUTATION RAY;  Surgeon: Wylene Simmer, MD;  Location: [facility];  Service: Orthopedics;  Laterality: Right;  right hallux amputation (1st ray resection )   AMPUTATION Left 03/06/2019   Procedure: Ray Amputation left foot;  Surgeon: Nicholes Stairs, MD;  Location: [facility];  Service: Orthopedics;  Laterality: Left;   CORONARY ANGIOPLASTY WITH STENT PLACEMENT     FINGER SURGERY     left index finger   I & D EXTREMITY  05/19/2012   Procedure: IRRIGATION AND DEBRIDEMENT EXTREMITY;  Surgeon: Marin Shutter, MD;  Location: [facility];  Service: Orthopedics;  Laterality: Right;  Right Great toe   I & D Toe  05/19/2012   rt great toe   TONSILLECTOMY     TRANSMETATARSAL AMPUTATION Left 12/10/2020   Procedure: TRANSMETATARSAL AMPUTATION LEFT;  Surgeon: Newt Minion, MD;  Location: [facility];  Service: Orthopedics;  Laterality: Left;    Family History  Problem Relation Age of Onset   Heart attack Mother    Arthritis Other    Coronary artery disease Other    Diabetes Other    Hypertension Other    Prostate cancer Other    Stroke Other     Social History:   reports that he has never smoked. His smokeless tobacco use includes chew. He reports current alcohol use. He reports that he does not use drugs.  Allergies: No Known Allergies  Medications: I have reviewed the patient's current medications.  Results for orders placed or performed during the hospital encounter of 10/22/21 (from the past 48 hour(s))  CBG monitoring, ED     Status: Abnormal   Collection Time: 10/22/21  3:56 PM  Result Value Ref Range   Glucose-Capillary 149 (H) 70 - 99 mg/dL    Comment: Glucose reference range applies only to samples taken after fasting for at least 8 hours.  Culture, blood (routine x 2)     Status: None (Preliminary result)   Collection Time: 10/22/21  4:27 PM   Specimen: BLOOD  Result Value Ref Range   Specimen Description BLOOD LEFT ANTECUBITAL    Special Requests      BOTTLES DRAWN AEROBIC AND ANAEROBIC Blood Culture adequate volume   Culture      NO GROWTH < 24 HOURS Performed at [facility], [place] [address], [place], [place] [address]    Report Status PENDING   Basic metabolic panel     Status: Abnormal   Collection Time: 10/22/21  5:10 PM  Result Value Ref Range   Sodium 132 (L) 135 - 145 mmol/L   Potassium 3.2 (L) 3.5 - 5.1 mmol/L   Chloride 101 98 - 111 mmol/L   CO2 16 (L) 22 - 32 mmol/L   Glucose, Bld 172 (H) 70 - 99 mg/dL    Comment: Glucose reference range applies only to samples taken after fasting for at least 8 hours.   BUN 29 (H) 8 - 23 mg/dL   Creatinine, Ser 1.39 (H) 0.61 - 1.24 mg/dL   Calcium 8.4 (L) 8.9 - 10.3 mg/dL   GFR, Estimated 58 (L) >60 mL/min    Comment: (NOTE) Calculated using the CKD-EPI Creatinine Equation (2021)    Anion gap 15 5 - 15    Comment: Performed at [facility] [address] [place], [place] [address]  CBC with Differential     Status: Abnormal   Collection Time: 10/22/21  5:10 PM  Result Value Ref Range   WBC 12.5 (H) 4.0 - 10.5 K/uL   RBC 3.72 (L) 4.22 - 5.81 MIL/uL   Hemoglobin  10.8 (L) 13.0 - 17.0 g/dL   HCT 33.4 (L) 39.0 - 52.0 %   MCV 89.8 80.0 - 100.0 fL   MCH 29.0 26.0 - 34.0 pg   MCHC 32.3 30.0 - 36.0 g/dL   RDW 14.6 11.5 - 15.5 %   Platelets 277 150 - 400 K/uL   nRBC 0.0 0.0 - 0.2 %   Neutrophils Relative % 83 %   Neutro Abs 10.3 (H) 1.7 - 7.7 K/uL   Lymphocytes Relative 6 %   Lymphs Abs 0.8 0.7 - 4.0 K/uL   Monocytes Relative 9 %   Monocytes Absolute 1.2 (H) 0.1 - 1.0 K/uL   Eosinophils Relative 1 %   Eosinophils Absolute 0.2 0.0 - 0.5 K/uL   Basophils Relative 0 %   Basophils Absolute 0.0 0.0 - 0.1 K/uL   Immature Granulocytes 1 %   Abs Immature Granulocytes 0.08 (H) 0.00 - 0.07 K/uL    Comment: Performed at [facility] [address], [place], [place] [address]  Sedimentation rate     Status: Abnormal   Collection Time: 10/22/21  5:10 PM  Result Value Ref Range   Sed Rate >140 (H) 0 - 16 mm/hr    Comment: CRITICAL RESULT CALLED TO, READ BACK BY AND VERIFIED WITH: RN Bradley Ferris AT 18:45,10/22/21,NS Performed at [facility], [address] [place], [place] [address]   C-reactive protein     Status: Abnormal   Collection Time: 10/22/21  5:10 PM  Result Value Ref Range   CRP 20.1 (H) <1.0 mg/dL    Comment: Performed at [facility] [address], [place], [place] [address]  Magnesium     Status: Abnormal   Collection Time: 10/22/21  5:10 PM  Result Value Ref Range   Magnesium 1.4 (L) 1.7 - 2.4 mg/dL    Comment: Performed at [facility] [address] [place], [place] [address]  Culture, blood (routine x 2)     Status: None (Preliminary result)   Collection Time: 10/22/21  5:30 PM   Specimen: BLOOD  Result Value Ref Range   Specimen Description BLOOD LEFT ANTECUBITAL    Special Requests      BOTTLES DRAWN AEROBIC AND ANAEROBIC Blood Culture results may not be optimal due to an excessive volume of blood received in culture bottles   Culture  NO GROWTH < 24 HOURS Performed at [facility],  [address] [place], [place] [address]    Report Status PENDING   MRSA Next Gen by PCR, Nasal     Status: None   Collection Time: 10/22/21  9:22 PM   Specimen: Nasal Mucosa; Nasal Swab  Result Value Ref Range   MRSA by PCR Next Gen NOT DETECTED NOT DETECTED    Comment: (NOTE) The GeneXpert MRSA Assay (FDA approved for NASAL specimens only), is one component of a comprehensive MRSA colonization surveillance program. It is not intended to diagnose MRSA infection nor to guide or monitor treatment for MRSA infections. Test performance is not FDA approved in patients less than 61 years old. Performed at [facility], [address] [place], [place] [address]   Comprehensive metabolic panel     Status: Abnormal   Collection Time: 10/23/21  3:54 AM  Result Value Ref Range   Sodium 135 135 - 145 mmol/L   Potassium 3.7 3.5 - 5.1 mmol/L   Chloride 105 98 - 111 mmol/L   CO2 20 (L) 22 - 32 mmol/L   Glucose, Bld 220 (H) 70 - 99 mg/dL    Comment: Glucose reference range applies only to samples taken after fasting for at least 8 hours.   BUN 28 (H) 8 - 23 mg/dL   Creatinine, Ser 5.62 (H) 0.61 - 1.24 mg/dL   Calcium 7.8 (L) 8.9 - 10.3 mg/dL   Total Protein 5.8 (L) 6.5 - 8.1 g/dL   Albumin 2.1 (L) 3.5 - 5.0 g/dL   AST 9 (L) 15 - 41 U/L   ALT 10 0 - 44 U/L   Alkaline Phosphatase 73 38 - 126 U/L   Total Bilirubin 0.3 0.3 - 1.2 mg/dL   GFR, Estimated >56 >38 mL/min    Comment: (NOTE) Calculated using the CKD-EPI Creatinine Equation (2021)    Anion gap 10 5 - 15    Comment: Performed at [facility], [address], [place], [place] [address]  CBC     Status: Abnormal   Collection Time: 10/23/21  3:54 AM  Result Value Ref Range   WBC 9.9 4.0 - 10.5 K/uL   RBC 3.14 (L) 4.22 - 5.81 MIL/uL   Hemoglobin 9.0 (L) 13.0 - 17.0 g/dL   HCT 28.7 (L) 68.1 - 15.7 %   MCV 88.9 80.0 - 100.0 fL   MCH 28.7 26.0 - 34.0 pg   MCHC 32.3 30.0 - 36.0 g/dL   RDW 26.2 03.5 - 59.7 %   Platelets 223  150 - 400 K/uL   nRBC 0.0 0.0 - 0.2 %    Comment: Performed at [facility], [address] [place], [place] [address]  Magnesium     Status: Abnormal   Collection Time: 10/23/21  3:54 AM  Result Value Ref Range   Magnesium 1.4 (L) 1.7 - 2.4 mg/dL    Comment: Performed at [facility], [address] [place], [place] [address]  Glucose, capillary     Status: Abnormal   Collection Time: 10/23/21 11:33 AM  Result Value Ref Range   Glucose-Capillary 208 (H) 70 - 99 mg/dL    Comment: Glucose reference range applies only to samples taken after fasting for at least 8 hours.    DG Foot Complete Left  Result Date: 10/22/2021 CLINICAL DATA:  Diabetic foot infection. Right foot pain. Previous amputations. EXAM: LEFT FOOT - COMPLETE 3+ VIEW COMPARISON:  12/08/2020 FINDINGS: Previous amputations of all digits are seen at the level of the proximal metatarsals. No evidence of osteolysis or periostitis to suggest the presence of osteomyelitis. No evidence of fracture or dislocation. No evidence of soft tissue gas. Prominent plantar calcaneal bone spur noted. IMPRESSION: No radiographic evidence of osteomyelitis, soft tissue gas, or other acute findings. Electronically Signed   By: Marlaine Hind M.D.   On: 10/22/2021 16:53    Review of Systems  HENT:  Negative for ear discharge, ear pain, hearing loss and tinnitus.   Eyes:  Negative for photophobia and pain.  Respiratory:  Negative for cough and shortness of breath.   Cardiovascular:  Negative for chest pain.  Gastrointestinal:  Negative for abdominal pain, nausea and vomiting.  Genitourinary:  Negative for dysuria, flank pain, frequency and urgency.  Musculoskeletal:  Positive for arthralgias (Left foot). Negative for back pain, myalgias and neck pain.  Neurological:  Negative for dizziness and headaches.  Hematological:  Does not bruise/bleed easily.  Psychiatric/Behavioral:  The patient is not nervous/anxious.   Blood pressure 126/66,  pulse 62, temperature 98.4 F (36.9 C), temperature source Oral, resp. rate 17, height 6' (1.829 m), weight 90.7 kg, SpO2 99 %. Physical Exam Constitutional:      General: He is not in acute distress.    Appearance: He is well-developed. He is not diaphoretic.  HENT:     Head: Normocephalic and atraumatic.  Eyes:     General: No scleral icterus.       Right eye: No discharge.        Left eye: No discharge.     Conjunctiva/sclera: Conjunctivae normal.  Cardiovascular:     Rate and Rhythm: Normal rate and regular rhythm.  Pulmonary:     Effort: Pulmonary effort is normal. No respiratory distress.  Musculoskeletal:     Cervical back: Normal range of motion.  Feet:     Comments: Left foot -- s/p TMT amputation. Small ulceration plantar with decent granulation, no discharge. Large necrotic ulceration medially with purulent discharge and foul odor. 1+ DP, 0 PT. Sensation stable. Skin:    General: Skin is warm and dry.  Neurological:     Mental Status: He is alert.  Psychiatric:        Mood and Affect: Mood normal.        Behavior: Behavior normal.    Assessment/Plan: Left foot diabetic ulcer -- Will check MRI to r/o osteo. Will need I&D vs BKA probably. Dr. Sharol Given to evaluate later today or in AM.    Lisette Abu, PA-C Orthopedic Surgery [phone] 10/23/2021, 11:36 AM

## 2021-10-23 NOTE — Consult Note (Addendum)
WOC Nurse Consult Note: Reason for Consult: Consult requested for left foot wounds.  X-ray did not indicate abscess or osteomyelitis.  Pt states left plantar foot has a chronic wound and he has been using Aquacel prior to admission.  3X1X.2cm, red and moist, no fluctuance or odor, mod amt yellow drainage. This week he developed generalized edema and erythremia to left foot and blistering, which has now evolved into full thickness tissue loss and loose peeling skin.  Affected area is approx 4X8cm.  Moist red skin under some areas, mod amt yellow drainage.  Area near the top of the wound is dark purple-black, fluctuance when probed with a swab abd large amt tan pus drainage.  Depth was 3 cm and located near the ankle.  Secure chat message sent to the primary team as follows, "The patient has a pus filled pocket which probes deeply when a swab was inserted. Please request an ortho consult for possible bedside debridement of nonviable tissue to left foot."  Dressing procedure/placement/frequency: Topical treatment orders provided for bedside nurses to perform as follows: Apply Aquacel [place] # [phone]) to left plantar foot wound Q day and cover with dry gauze and tape.  Please refer to ortho service for orders to other left foot wounds. Please re-consult if further assistance is needed.  Thank-you,  Cammie Mcgee MSN, RN, CWOCN, [place], CNS [phone]

## 2021-10-23 NOTE — Plan of Care (Signed)
  Problem: Nutrition: Goal: Adequate nutrition will be maintained Outcome: Progressing   

## 2021-10-23 NOTE — Progress Notes (Signed)
The patient is injury-free, afebrile, alert, and oriented X 4. Vital signs were within the baseline during this shift. He complained of foot pain, which improved with current pain regimen. Pt denies chest pain, SOB, dizziness, signs or symptoms of bleeding, or acute changes during this shift. We will continue to monitor and work toward achieving the care plan goals

## 2021-10-23 NOTE — Progress Notes (Signed)
°   10/23/21 1430  Clinical Encounter Type  Visited With Patient  Visit Type Follow-up  Referral From Patient  Consult/Referral To Chaplain;Other (Comment) (Notary and Volunteers)   Followed-up with patient for execution of A.D. Patient has completed Parts A & B and is ready for signing. Notary was not available at this time. Tentative appointment scheduled with notary for 1400 tomorrow.

## 2021-10-23 NOTE — Plan of Care (Signed)

## 2021-10-24 DIAGNOSIS — L02612 Cutaneous abscess of left foot: Secondary | ICD-10-CM

## 2021-10-24 DIAGNOSIS — E43 Unspecified severe protein-calorie malnutrition: Secondary | ICD-10-CM

## 2021-10-24 DIAGNOSIS — L089 Local infection of the skin and subcutaneous tissue, unspecified: Secondary | ICD-10-CM | POA: Diagnosis not present

## 2021-10-24 DIAGNOSIS — E11628 Type 2 diabetes mellitus with other skin complications: Secondary | ICD-10-CM | POA: Diagnosis not present

## 2021-10-24 DIAGNOSIS — Z89432 Acquired absence of left foot: Secondary | ICD-10-CM | POA: Diagnosis not present

## 2021-10-24 LAB — BASIC METABOLIC PANEL
Anion gap: 9 (ref 5–15)
BUN: 18 mg/dL (ref 8–23)
CO2: 22 mmol/L (ref 22–32)
Calcium: 8.7 mg/dL — ABNORMAL LOW (ref 8.9–10.3)
Chloride: 109 mmol/L (ref 98–111)
Creatinine, Ser: 1.03 mg/dL (ref 0.61–1.24)
GFR, Estimated: >60 mL/min (ref >60)
Glucose, Bld: 151 mg/dL — ABNORMAL HIGH (ref 70–99)
Potassium: 5.1 mmol/L (ref 3.5–5.1)
Sodium: 140 mmol/L (ref 135–145)

## 2021-10-24 LAB — HEMOGLOBIN A1C
Hgb A1c MFr Bld: 7.2 % — ABNORMAL HIGH (ref 4.8–5.6)
Mean Plasma Glucose: 159.94 mg/dL

## 2021-10-24 LAB — MAGNESIUM: Magnesium: 1.6 mg/dL — ABNORMAL LOW (ref 1.7–2.4)

## 2021-10-24 LAB — CBC WITH DIFFERENTIAL/PLATELET
Abs Immature Granulocytes: 0.07 10*3/uL (ref 0.00–0.07)
Basophils Absolute: 0.1 10*3/uL (ref 0.0–0.1)
Basophils Relative: 1 %
Eosinophils Absolute: 0.2 10*3/uL (ref 0.0–0.5)
Eosinophils Relative: 3 %
HCT: 31.7 % — ABNORMAL LOW (ref 39.0–52.0)
Hemoglobin: 10 g/dL — ABNORMAL LOW (ref 13.0–17.0)
Immature Granulocytes: 1 %
Lymphocytes Relative: 11 %
Lymphs Abs: 1 10*3/uL (ref 0.7–4.0)
MCH: 28.2 pg (ref 26.0–34.0)
MCHC: 31.5 g/dL (ref 30.0–36.0)
MCV: 89.3 fL (ref 80.0–100.0)
Monocytes Absolute: 1.1 10*3/uL — ABNORMAL HIGH (ref 0.1–1.0)
Monocytes Relative: 13 %
Neutro Abs: 6.2 10*3/uL (ref 1.7–7.7)
Neutrophils Relative %: 71 %
Platelets: 265 10*3/uL (ref 150–400)
RBC: 3.55 MIL/uL — ABNORMAL LOW (ref 4.22–5.81)
RDW: 14.8 % (ref 11.5–15.5)
WBC: 8.6 10*3/uL (ref 4.0–10.5)
nRBC: 0 % (ref 0.0–0.2)

## 2021-10-24 LAB — GLUCOSE, CAPILLARY
Glucose-Capillary: 178 mg/dL — ABNORMAL HIGH (ref 70–99)
Glucose-Capillary: 157 mg/dL — ABNORMAL HIGH (ref 70–99)
Glucose-Capillary: 194 mg/dL — ABNORMAL HIGH (ref 70–99)
Glucose-Capillary: 154 mg/dL — ABNORMAL HIGH (ref 70–99)

## 2021-10-24 MED ORDER — POTASSIUM CHLORIDE CRYS ER 20 MEQ PO TBCR: 40.0000 meq | EXTENDED_RELEASE_TABLET | Freq: Two times a day (BID) | ORAL | Status: DC

## 2021-10-24 MED ORDER — INSULIN GLARGINE-YFGN 100 UNIT/ML ~~LOC~~ SOLN
10.0000 [IU] | Freq: Every day | SUBCUTANEOUS | Status: DC
  Administered 2021-10-24 – 2021-10-28 (×4): 10 [IU] via SUBCUTANEOUS
  Filled 2021-10-24 (×5): qty 0.1

## 2021-10-24 MED ORDER — SODIUM CHLORIDE 0.9% FLUSH
10.0000 mL | Freq: Two times a day (BID) | INTRAVENOUS | Status: DC
  Administered 2021-10-24 – 2021-10-27 (×4): 10 mL

## 2021-10-24 MED ORDER — SODIUM CHLORIDE 0.9% FLUSH: 10.0000 mL | INTRAVENOUS | Status: DC | PRN

## 2021-10-24 NOTE — Progress Notes (Signed)

## 2021-10-24 NOTE — Progress Notes (Signed)
PROGRESS NOTE    [facility]  TIW:[id number] DOB: 10-25-1960 DOA: 10/22/2021 PCP: [name], MD   Brief Narrative:  HPI: Robert Huang is a 61 y.o. male with medical history significant of anemia, hypothyroidism, gout, hypertension, hyperlipidemia, diabetes, anxiety, osteomyelitis status post right great toe amputation and status post left ray amputation presenting with left foot pain.  Patient states that for the past 4 days he has had some pain in his left foot.  It has been increasing over that time.  Today he noticed redness in the foot with multiple blisters in the shower and stated "looks like it is going to fall off ".  As above does have history of prior infections and osteomyelitis requiring amputation.  Also has a history of MRSA infection.  Does report chills but denies fevers.   He states that the summer he got an ulceration on the bottom of his left foot that has been very slow to heal, but does not seem infected at any point.  Additionally denies chest pain, shortness of breath, abdominal pain, constipation, diarrhea, nausea, vomiting.   ED Course: Vital signs in the ED stable.  Lab work-up included BMP which showed sodium 132 which corrects to 130 3134 considering glucose of 172, potassium 3.2, bicarb 16, BUN 29, creatinine 1.39 which is near baseline of 1.15.  Calcium 8.4.  CBC with leukocytosis to 12.5, hemoglobin stable at 10.8.  CRP elevated at 20.1.  X-ray of the left foot showed no evidence of osteomyelitis based on x-ray findings and no gas in the soft tissues.  Also no other acute abnormalities were noted on x-ray.  Patient received Dilaudid in the ED.  As well as a liter of fluids.  Vancomycin and Zosyn have been ordered as well as 20 mEq of p.o. potassium.  Assessment & Plan:   Principal Problem:   Diabetic foot infection ([place]) Active Problems:   Hypothyroidism   Dyslipidemia   Anxiety state   Essential hypertension   Coronary atherosclerosis    Diabetes mellitus with complication (HCC)   Hypokalemia   History of complete ray amputation of fifth toe of left foot (HCC)   Status post amputation of right great toe (HCC)   Iron deficiency anemia   CKD (chronic kidney disease) stage 2, GFR 60-89 ml/min   Hyponatremia   Abscess of left foot   History of transmetatarsal amputation of left foot (HCC)   Severe protein-calorie malnutrition (HCC)  Sepsis secondary to diabetic left foot infection/deep abscesses with previous history of left ray amputation: Patient did not have sepsis upon presentation.  Met criteria for sepsis after admission based on white cells of 4.5 and fever of 100.7 that developed after admission.  Seen by orthopedics, MRI confirmed deep multiple abscesses in the left foot but no osteomyelitis.  Seen by Dr. Sharol Given, plan for debridement tomorrow and then possibly repeat debridement on Friday with biological tissue graft.  We will continue current broad-spectrum antibiotics.  Blood culture negative.   Hypomagnesemia: Replaced yesterday.  Rechecking today.  AKI: Patient had mild AKI which resolved.  Please note that patient does not have CKD as mentioned in previous notes.  Hypokalemia: Resolved.  Hyponatremia: Resolved.  Anemia of chronic disease: Hemoglobin is stable.  Hypothyroidism: Continue Synthroid.  Essential hypertension: Blood pressure controlled.  Continue home amlodipine, clonidine, senna pro-hydrochlorothiazide, metoprolol   CAD/hyperlipidemia: Asymptomatic. - Continue home Zetia, fenofibrate, rosuvastatin  Diabetes melitis type II: Appears to be taking metformin at home which is on hold.  Blood sugar slightly elevated.  Check hemoglobin A1c.  Continue SSI, add 10 units of Semglee.  Anxiety - Continue as needed Xanax      DVT prophylaxis: enoxaparin (LOVENOX) injection 40 mg Start: 10/22/21 1830   Code Status: Full Code  Family Communication:  None present at bedside.  Plan of care discussed with  patient in length and he/she verbalized understanding and agreed with it.  Status is: Inpatient Remains inpatient appropriate because: Needs surgical procedure.  Estimated body mass index is 27.12 kg/m as calculated from the following:   Height as of this encounter: 6' (1.829 m).   Weight as of this encounter: 90.7 kg.    Nutritional Assessment: Body mass index is 27.12 kg/m.[name] Seen by dietician.  I agree with the assessment and plan as outlined below: Nutrition Status:        . Skin Assessment: I have examined the patient's skin and I agree with the wound assessment as performed by the wound care RN as outlined below:    Consultants:  Orthopedics  Procedures:  None  Antimicrobials:  Anti-infectives (From admission, onward)    Start     Dose/Rate Route Frequency Ordered Stop   10/23/21 2200  vancomycin (VANCOREADY) IVPB 1750 mg/350 mL        1,750 mg 175 mL/hr over 120 Minutes Intravenous Every 24 hours 10/22/21 2124     10/22/21 1845  ceFEPIme (MAXIPIME) 2 g in sodium chloride 0.9 % 100 mL IVPB        2 g 200 mL/hr over 30 Minutes Intravenous Every 8 hours 10/22/21 1838     10/22/21 1830  metroNIDAZOLE (FLAGYL) IVPB 500 mg        500 mg 100 mL/hr over 60 Minutes Intravenous Every 12 hours 10/22/21 1823     10/22/21 1815  vancomycin (VANCOREADY) IVPB 2000 mg/400 mL        2,000 mg 200 mL/hr over 120 Minutes Intravenous  Once 10/22/21 1805 10/22/21 2331   10/22/21 1800  piperacillin-tazobactam (ZOSYN) IVPB 3.375 g  Status:  Discontinued        3.375 g 100 mL/hr over 30 Minutes Intravenous  Once 10/22/21 1756 10/22/21 1823         Subjective: Seen and examined.  Doing well.  Still with left foot pain but that is tolerable.  Objective: Vitals:   10/23/21 1757 10/23/21 2109 10/24/21 0425 10/24/21 0919  BP: (!) 152/70 129/70 (!) 154/80 (!) 166/70  Pulse: 73 68 72 66  Resp:  _0 Temp: 98.4 F (36.9 C) 99.9 F (37.7 C) 98.8 F (37.1 C) 98.9 F (37.2  C)  TempSrc: Oral   Oral  SpO2: 100% 98% 100% 100%  Weight:      Height:        Intake/Output Summary (Last 24 hours) at 10/24/2021 1028 Last data filed at 10/24/2021 0600 Gross per 24 hour  Intake 1396.41 ml  Output 2275 ml  Net -878.59 ml    Filed Weights   10/22/21 1606  Weight: 90.7 kg    Examination:  General exam: Appears calm and comfortable  Respiratory system: Clear to auscultation. Respiratory effort normal. Cardiovascular system: S1 & S2 heard, RRR. No JVD, murmurs, rubs, gallops or clicks. No pedal edema. Gastrointestinal system: Abdomen is nondistended, soft and nontender. No organomegaly or masses felt. Normal bowel sounds heard. Central nervous system: Alert and oriented. No focal neurological deficits. Extremities: Dressing in the left foot. Skin: No rashes, lesions or ulcers.  Psychiatry:  Judgement and insight appear normal. Mood & affect appropriate.   Data Reviewed: I have personally reviewed following labs and imaging studies  CBC: Recent Labs  Lab 10/22/21 1710 10/23/21 0354 10/24/21 0435  WBC 12.5* 9.9 8.6  NEUTROABS 10.3*  --  6.2  HGB 10.8* 9.0* 10.0*  HCT 33.4* 27.9* 31.7*  MCV 89.8 88.9 89.3  PLT 277 223 347    Basic Metabolic Panel: Recent Labs  Lab 10/22/21 1710 10/23/21 0354 10/24/21 0435  NA 132* 135 140  K 3.2* 3.7 5.1  CL 101 105 109  CO2 16* 20* 22  GLUCOSE 172* 220* 151*  BUN 29* 28* 18  CREATININE 1.39* 1.30* 1.03  CALCIUM 8.4* 7.8* 8.7*  MG 1.4* 1.4*  --     GFR: Estimated Creatinine Clearance: 82.7 mL/min (by C-G formula based on SCr of 1.03 mg/dL). Liver Function Tests: Recent Labs  Lab 10/23/21 0354  AST 9*  ALT 10  ALKPHOS 73  BILITOT 0.3  PROT 5.8*  ALBUMIN 2.1*    No results for input(s): LIPASE, AMYLASE in the last 168 hours. No results for input(s): AMMONIA in the last 168 hours. Coagulation Profile: No results for input(s): INR, PROTIME in the last 168 hours. Cardiac Enzymes: No results for  input(s): CKTOTAL, CKMB, CKMBINDEX, TROPONINI in the last 168 hours. BNP (last 3 results) No results for input(s): PROBNP in the last 8760 hours. HbA1C: Recent Labs    10/24/21 0435  HGBA1C 7.2*   CBG: Recent Labs  Lab 10/22/21 1556 10/23/21 1133 10/23/21 1739 10/23/21 2110 10/24/21 0723  GLUCAP 149* 208* 156* 188* 178*    Lipid Profile: No results for input(s): CHOL, HDL, LDLCALC, TRIG, CHOLHDL, LDLDIRECT in the last 72 hours. Thyroid Function Tests: No results for input(s): TSH, T4TOTAL, FREET4, T3FREE, THYROIDAB in the last 72 hours. Anemia Panel: No results for input(s): VITAMINB12, FOLATE, FERRITIN, TIBC, IRON, RETICCTPCT in the last 72 hours. Sepsis Labs: No results for input(s): PROCALCITON, LATICACIDVEN in the last 168 hours.  Recent Results (from the past 240 hour(s))  Culture, blood (routine x 2)     Status: None (Preliminary result)   Collection Time: 10/22/21  4:27 PM   Specimen: BLOOD  Result Value Ref Range Status   Specimen Description BLOOD LEFT ANTECUBITAL  Final   Special Requests   Final    BOTTLES DRAWN AEROBIC AND ANAEROBIC Blood Culture adequate volume   Culture   Final    NO GROWTH 2 DAYS Performed at [facility], [address], [place], [place] [address]    Report Status PENDING  Incomplete  Culture, blood (routine x 2)     Status: None (Preliminary result)   Collection Time: 10/22/21  5:30 PM   Specimen: BLOOD  Result Value Ref Range Status   Specimen Description BLOOD LEFT ANTECUBITAL  Final   Special Requests   Final    BOTTLES DRAWN AEROBIC AND ANAEROBIC Blood Culture results may not be optimal due to an excessive volume of blood received in culture bottles   Culture   Final    NO GROWTH 2 DAYS Performed at [facility], [place] [address] [place],  [address]    Report Status PENDING  Incomplete  MRSA Next Gen by PCR, Nasal     Status: None   Collection Time: 10/22/21  9:22 PM   Specimen: Nasal Mucosa; Nasal Swab   Result Value Ref Range Status   MRSA by PCR Next Gen NOT DETECTED NOT DETECTED Final  Comment: (NOTE) The GeneXpert MRSA Assay (FDA approved for NASAL specimens only), is one component of a comprehensive MRSA colonization surveillance program. It is not intended to diagnose MRSA infection nor to guide or monitor treatment for MRSA infections. Test performance is not FDA approved in patients less than 37 years old. Performed at [facility], [place] [address] [place], [place] [address]       Radiology Studies: MR FOOT LEFT W WO CONTRAST  Result Date: 10/23/2021 CLINICAL DATA:  Nonhealing ulcer.  History of amputations. EXAM: MRI OF THE LEFT FOREFOOT WITHOUT AND WITH CONTRAST TECHNIQUE: Multiplanar, multisequence MR imaging of the left foot was performed both before and after administration of intravenous contrast. CONTRAST:  9.67m GADAVIST GADOBUTROL 1 MMOL/ML IV SOLN COMPARISON:  MRI 10/22/2020 and radiographs 10/22/2021 FINDINGS: Extensive subcutaneous soft tissue swelling/edema/fluid involving the medial and plantar aspect of the foot consistent with severe cellulitis. There also small multifocal abscesses. The largest abscess is on the plantar and medial aspect of the foot and measures 3 cm. It likely communicates with a small open wound. Two communicating abscesses in the medial soft tissues measure maximal 4.4 cm and slightly more superiorly there is a small abscess measuring 2.5 cm and this contains a small amount of gas. Associated myofasciitis involving the short flexor muscles, most notably the abductor hallucis muscle. I do not see any definite MR findings to suggest septic arthritis or osteomyelitis. Chronic tendinopathy involving the anterior tibialis tendon. IMPRESSION: 1. Extensive cellulitis and multifocal abscesses involving the medial and plantar aspect of the foot. 2. No definite MR findings to suggest septic arthritis or osteomyelitis. Electronically Signed   By: PMarijo SanesM.D.   On: 10/23/2021 17:55   DG Foot Complete Left  Result Date: 10/22/2021 CLINICAL DATA:  Diabetic foot infection. Right foot pain. Previous amputations. EXAM: LEFT FOOT - COMPLETE 3+ VIEW COMPARISON:  12/08/2020 FINDINGS: Previous amputations of all digits are seen at the level of the proximal metatarsals. No evidence of osteolysis or periostitis to suggest the presence of osteomyelitis. No evidence of fracture or dislocation. No evidence of soft tissue gas. Prominent plantar calcaneal bone spur noted. IMPRESSION: No radiographic evidence of osteomyelitis, soft tissue gas, or other acute findings. Electronically Signed   By: JMarlaine HindM.D.   On: 10/22/2021 16:53    Scheduled Meds:  amLODipine  10 mg Oral Daily   cloNIDine  0.3 mg Oral BID   enoxaparin (LOVENOX) injection  40 mg Subcutaneous Q24H   ezetimibe  10 mg Oral Daily   fenofibrate  160 mg Oral Daily   insulin aspart  0-15 Units Subcutaneous TID WC   insulin aspart  0-5 Units Subcutaneous QHS   insulin glargine-yfgn  10 Units Subcutaneous Daily   levothyroxine  112 mcg Oral Daily   metoprolol succinate  100 mg Oral BID   pantoprazole  40 mg Oral Daily   rosuvastatin  40 mg Oral Daily   sodium chloride flush  3 mL Intravenous Q12H   Continuous Infusions:  sodium chloride 10 mL/hr at 10/23/21 2209   ceFEPime (MAXIPIME) IV Stopped (10/24/21 0548)   metronidazole Stopped (10/24/21 01027   vancomycin Stopped (10/24/21 0314)     LOS: 1 day   Time spent: 25 minutes  RDarliss Cheney MD [facility]  10/24/2021, 10:28 AM  Please page via AShea Evansand do not message via secure chat for urgent patient care matters. Secure chat can be used for non urgent patient care matters.  How to  contact the [facility] Attending or Consulting provider [place] or covering provider during after hours [place], for this patient?  Check the care team in [facility] and look for a) attending/consulting [facility] provider listed and b) the [facility] team listed.  Page or secure chat [phone]. Log into [email or web address] and use [place]'s universal password to access. If you do not have the password, please contact the hospital operator. Locate the [facility] provider you are looking for under [facility] and page to a number that you can be directly reached. If you still have difficulty reaching the provider, please page the [facility] (Director on Call) for the Hospitalists listed on amion for assistance.

## 2021-10-24 NOTE — Progress Notes (Signed)
°   10/24/21 1330  Clinical Encounter Type  Visited With Patient;Health care provider  Visit Type Follow-up  Referral From Patient  Consult/Referral To Chaplain   Requested to Arrange Notary and two volunteers from [employer] for the signing of [name] Advance Directive: Robert Huang and Living Will. Met with Robert Huang at bedside. Patient indicated that his wishes for Robert Huang [id number] [phone] and Robert Huang [phone] as his health care agents were clearly indicated on PART A: H.C. P.O.A. and that his wishes were clearly indicated on PART B: Living Will   Patient signed his Advance Directive in the presence of Robert Huang; and volunteer witnesses Robert Huang and Robert Huang, all of whom have signed in the appropriate spaces on Part: C of [name] Advance Directive.   A copy of [name] A.D. was placed in his hard chart, a copy was emailed for scanning into his electronic medical record, the original and two copies were then returned to him.   [name], [place]. [phone].

## 2021-10-24 NOTE — Consult Note (Signed)
ORTHOPAEDIC CONSULTATION  REQUESTING PHYSICIAN: Darliss Cheney, MD  Chief Complaint: Draining abscess left foot.  HPI: Robert Huang is a 61 y.o. male who presents with diabetic insensate neuropathy status post transmetatarsal amputation of the left foot status post great toe amputation of the right foot who presents with acute abscess and purulent drainage on the plantar medial aspect of the left foot.  Past Medical History:  Diagnosis Date   Anxiety    CAD (coronary artery disease)    sees Dr. Stanford Breed, normal Stress test 07-16-12    Diabetes mellitus    DJD (degenerative joint disease)    Gout    Hyperlipidemia    Hypertension    Hypothyroid    Osteoarthritis    Renal calculus    hx   Umbilical hernia    Past Surgical History:  Procedure Laterality Date   AMPUTATION  07/17/2012   Procedure: AMPUTATION RAY;  Surgeon: Wylene Simmer, MD;  Location: [facility];  Service: Orthopedics;  Laterality: Right;  right hallux amputation (1st ray resection )   AMPUTATION Left 03/06/2019   Procedure: Ray Amputation left foot;  Surgeon: Nicholes Stairs, MD;  Location: [facility];  Service: Orthopedics;  Laterality: Left;   CORONARY ANGIOPLASTY WITH STENT PLACEMENT     FINGER SURGERY     left index finger   I & D EXTREMITY  05/19/2012   Procedure: IRRIGATION AND DEBRIDEMENT EXTREMITY;  Surgeon: Marin Shutter, MD;  Location: [facility];  Service: Orthopedics;  Laterality: Right;  Right Great toe   I & D Toe  05/19/2012   rt great toe   TONSILLECTOMY     TRANSMETATARSAL AMPUTATION Left 12/10/2020   Procedure: TRANSMETATARSAL AMPUTATION LEFT;  Surgeon: Newt Minion, MD;  Location: [facility];  Service: Orthopedics;  Laterality: Left;   Social History   Socioeconomic History   Marital status: Married    Spouse name: Not on file   Number of children: Not on file   Years of education: Not on file   Highest education level: Not on file  Occupational History   Not on file  Tobacco Use    Smoking status: Never   Smokeless tobacco: Current    Types: Chew   Tobacco comments:    occ  Substance and Sexual Activity   Alcohol use: Yes    Alcohol/week: 0.0 standard drinks    Comment: weekends   Drug use: No   Sexual activity: Yes  Other Topics Concern   Not on file  Social History Narrative   Not on file   Social Determinants of Health   Financial Resource Strain: Not on file  Food Insecurity: Not on file  Transportation Needs: Not on file  Physical Activity: Not on file  Stress: Not on file  Social Connections: Not on file   Family History  Problem Relation Age of Onset   Heart attack Mother    Arthritis Other    Coronary artery disease Other    Diabetes Other    Hypertension Other    Prostate cancer Other    Stroke Other    - negative except otherwise stated in the family history section No Known Allergies Prior to Admission medications   Medication Sig Start Date End Date Taking? Authorizing Provider  acetaminophen (TYLENOL) 325 MG tablet Take 2 tablets (650 mg total) by mouth every 6 (six) hours as needed for mild pain (or Fever >/= 101). 03/09/19  Yes Marylyn Ishihara, [name], DO  ALPRAZolam Duanne Moron)  1 MG tablet Take 1 tablet (1 mg total) by mouth 3 (three) times daily as needed for anxiety. 05/09/21  Yes [name], MD  amLODipine (NORVASC) 10 MG tablet TAKE 1 TABLET (10 MG TOTAL) BY MOUTH DAILY. <PLEASE MAKE APPOINTMENT FOR REFILLS> Patient taking differently: Take 10 mg by mouth daily. 09/20/21  Yes [name], MD  Aspirin-Acetaminophen-Caffeine [phone] MG PACK Take 1 Package by mouth daily as needed (pain). 07/06/15  Yes [provider]  cloNIDine (CATAPRES) 0.3 MG tablet TAKE 1 TABLET (0.3 MG TOTAL) BY MOUTH 2 (TWO) TIMES DAILY. NEED OV. Patient taking differently: Take 0.3 mg by mouth 2 (two) times daily. 09/20/21  Yes [name], MD  cyanocobalamin (,VITAMIN B-12,) 1000 MCG/ML injection Inject 1 mL (1,000 mcg total) into the muscle once a  week. 12/19/20  Yes [name], MD  doxycycline (VIBRAMYCIN) 100 MG capsule Take 100 mg by mouth daily as needed (For infection). 08/22/21  Yes [provider]  ezetimibe (ZETIA) 10 MG tablet TAKE 1 TABLET BY MOUTH EVERY DAY Patient taking differently: Take 10 mg by mouth daily. 11/21/20  Yes [name], MD  fenofibrate 160 MG tablet TAKE 1 TABLET (160 MG TOTAL) BY MOUTH DAILY. NEED OV. Patient taking differently: Take 160 mg by mouth daily. 11/21/20  Yes [name], MD  ferrous sulfate 325 (65 FE) MG EC tablet Take 1 tablet (325 mg total) by mouth 2 (two) times daily. 12/11/20 12/11/21 Yes Ghimire, Henreitta Leber, MD  fluticasone (FLONASE) 50 MCG/ACT nasal spray Place 2 sprays into both nostrils daily. 08/13/20  Yes Hassell Done, Mary-Margaret, FNP  HYDROcodone-acetaminophen (NORCO) 10-325 MG tablet Take 1 tablet by mouth every 6 (six) hours as needed for moderate pain. 10/27/21 11/26/21 Yes [name], MD  indomethacin (INDOCIN SR) 75 MG CR capsule Take 1 capsule (75 mg total) by mouth daily as needed for moderate pain. 12/11/20  Yes Ghimire, Henreitta Leber, MD  levothyroxine (SYNTHROID) 112 MCG tablet TAKE 1 TABLET BY MOUTH EVERY DAY Patient taking differently: Take 112 mcg by mouth daily before breakfast. 09/20/21  Yes [name], MD  lisinopril-hydrochlorothiazide (ZESTORETIC) 20-12.5 MG tablet TAKE 1 TABLET BY MOUTH 2 (TWO) TIMES DAILY. 10AM AND 5PM 09/20/21  Yes [name], MD  metFORMIN (GLUCOPHAGE) 1000 MG tablet TAKE 1 TABLET BY MOUTH TWO TIMES A DAY WITH A MEAL Patient taking differently: Take 1,000 mg by mouth 2 (two) times daily with a meal. 07/31/21  Yes [name], MD  metoprolol succinate (TOPROL-XL) 100 MG 24 hr tablet TAKE 1 TABLET BY MOUTH TWICE A DAY Patient taking differently: Take 100 mg by mouth 2 (two) times daily. 09/20/21  Yes [name], MD  mupirocin ointment (BACTROBAN) 2 % Apply 1 application topically 2 (two) times daily. 12/19/20  Yes [name], MD   nitroGLYCERIN (NITROSTAT) 0.4 MG SL tablet Place 1 tablet (0.4 mg total) under the tongue every 5 (five) minutes as needed. For chest pain. Patient taking differently: Place 0.4 mg under the tongue every 5 (five) minutes as needed for chest pain. 05/21/18  Yes Lelon Perla, MD  omeprazole (PRILOSEC) 40 MG capsule TAKE 1 CAPSULE (40 MG TOTAL) BY MOUTH DAILY. Patient taking differently: Take 40 mg by mouth daily. 09/20/21  Yes [name], MD  [facility] test strip 1 EACH BY OTHER ROUTE DAILY. USE AS INSTRUCTED 06/16/21  Yes [name], MD  polyethylene glycol (MIRALAX) 17 g packet Take 17  g by mouth daily as needed. Patient taking differently: Take 17 g by mouth daily as needed for mild constipation. 12/11/20  Yes Ghimire, Henreitta Leber, MD  rosuvastatin (CRESTOR) 40 MG tablet Take 1 tablet (40 mg total) by mouth daily. 02/28/21  Yes [name], MD  zolpidem (AMBIEN) 10 MG tablet TAKE 1 TABLET BY MOUTH AT BEDTIME AS NEEDED FOR SLEEP Patient taking differently: Take 10 mg by mouth at bedtime. 08/22/21  Yes [name], MD  colchicine 0.6 MG tablet TAKE 1 TABLET (0.6 MG TOTAL) BY MOUTH EVERY 6 (SIX) HOURS AS NEEDED (GOUT). Patient not taking: Reported on 10/22/2021 02/22/20   [name], MD  famotidine (PEPCID) 40 MG tablet Take 1 tablet (40 mg total) by mouth daily. Patient not taking: Reported on 10/22/2021 12/19/20   [name], MD  glipiZIDE (GLUCOTROL) 5 MG tablet TAKE 1 TABLET BY MOUTH TWICE A DAY Patient not taking: Reported on 10/22/2021 01/03/21   [name], MD  Syringe/Needle, Disp, (SYRINGE 3CC/25GX1") 25G X 1" 3 ML MISC 1 application by Does not apply route once a week. 01/02/21   [name], MD   MR FOOT LEFT W WO CONTRAST  Result Date: 10/23/2021 CLINICAL DATA:  Nonhealing ulcer.  History of amputations. EXAM: MRI OF THE LEFT FOREFOOT WITHOUT AND WITH CONTRAST TECHNIQUE: Multiplanar, multisequence MR imaging of the left foot was performed both before and after  administration of intravenous contrast. CONTRAST:  9.60mL GADAVIST GADOBUTROL 1 MMOL/ML IV SOLN COMPARISON:  MRI 10/22/2020 and radiographs 10/22/2021 FINDINGS: Extensive subcutaneous soft tissue swelling/edema/fluid involving the medial and plantar aspect of the foot consistent with severe cellulitis. There also small multifocal abscesses. The largest abscess is on the plantar and medial aspect of the foot and measures 3 cm. It likely communicates with a small open wound. Two communicating abscesses in the medial soft tissues measure maximal 4.4 cm and slightly more superiorly there is a small abscess measuring 2.5 cm and this contains a small amount of gas. Associated myofasciitis involving the short flexor muscles, most notably the abductor hallucis muscle. I do not see any definite MR findings to suggest septic arthritis or osteomyelitis. Chronic tendinopathy involving the anterior tibialis tendon. IMPRESSION: 1. Extensive cellulitis and multifocal abscesses involving the medial and plantar aspect of the foot. 2. No definite MR findings to suggest septic arthritis or osteomyelitis. Electronically Signed   By: Marijo Sanes M.D.   On: 10/23/2021 17:55   DG Foot Complete Left  Result Date: 10/22/2021 CLINICAL DATA:  Diabetic foot infection. Right foot pain. Previous amputations. EXAM: LEFT FOOT - COMPLETE 3+ VIEW COMPARISON:  12/08/2020 FINDINGS: Previous amputations of all digits are seen at the level of the proximal metatarsals. No evidence of osteolysis or periostitis to suggest the presence of osteomyelitis. No evidence of fracture or dislocation. No evidence of soft tissue gas. Prominent plantar calcaneal bone spur noted. IMPRESSION: No radiographic evidence of osteomyelitis, soft tissue gas, or other acute findings. Electronically Signed   By: Marlaine Hind M.D.   On: 10/22/2021 16:53   - pertinent xrays, CT, MRI studies were reviewed and independently interpreted  Positive ROS: All other systems have  been reviewed and were otherwise negative with the exception of those mentioned in the HPI and as above.  Physical Exam: General: Alert, no acute distress Psychiatric: Patient is competent for consent with normal mood and affect Lymphatic: No axillary or cervical lymphadenopathy Cardiovascular: No pedal edema Respiratory: No cyanosis, no use of accessory  musculature GI: No organomegaly, abdomen is soft and non-tender    Images:  @ENCIMAGES @  Labs:  Lab Results  Component Value Date   HGBA1C 6.6 (H) 12/08/2020   HGBA1C 7.5 (H) 09/28/2020   HGBA1C 7.6 (A) 10/19/2019   ESRSEDRATE >140 (H) 10/22/2021   ESRSEDRATE 50 (H) 03/02/2019   ESRSEDRATE 63 (H) 07/17/2012   CRP 20.1 (H) 10/22/2021   CRP 2.9 (H) 03/02/2019   CRP 1.3 (H) 07/17/2012   LABURIC 7.2 12/03/2017   LABURIC 7.6 01/11/2017   LABURIC 12.7 (H) 11/05/2014   REPTSTATUS PENDING 10/22/2021   GRAMSTAIN  12/06/2020    FEW WBC PRESENT, PREDOMINANTLY PMN FEW GRAM POSITIVE COCCI IN PAIRS IN CLUSTERS    CULT  10/22/2021    NO GROWTH < 24 HOURS Performed at [facility] [address], [place], [place] [address]    LABORGA METHICILLIN RESISTANT STAPHYLOCOCCUS AUREUS 10/13/2020   LABORGA ENTEROCOCCUS FAECALIS 10/13/2020    Lab Results  Component Value Date   ALBUMIN 2.1 (L) 10/23/2021   ALBUMIN 3.0 (L) 12/08/2020   ALBUMIN 3.9 09/28/2020   LABURIC 7.2 12/03/2017   LABURIC 7.6 01/11/2017   LABURIC 12.7 (H) 11/05/2014     CBC EXTENDED Latest Ref Rng & Units 10/24/2021 10/23/2021 10/22/2021  WBC 4.0 - 10.5 K/uL 8.6 9.9 12.5(H)  RBC 4.22 - 5.81 MIL/uL 3.55(L) 3.14(L) 3.72(L)  HGB 13.0 - 17.0 g/dL 10.0(L) 9.0(L) 10.8(L)  HCT 39.0 - 52.0 % 31.7(L) 27.9(L) 33.4(L)  PLT 150 - 400 K/uL 265 223 277  NEUTROABS 1.7 - 7.7 K/uL 6.2 - 10.3(H)  LYMPHSABS 0.7 - 4.0 K/uL 1.0 - 0.8    Neurologic: Patient does not have protective sensation bilateral lower extremities.   MUSCULOSKELETAL:   Skin: Examination there is a  large superficial abscess plantar and medial to the left foot.  There is swelling and cellulitis up to the ankle.  Patient has a strong palpable dorsalis pedis pulse on the right.  Ankle-brachial indices 2 years ago showed triphasic flow with normal ABIs.  Review of the MRI scan shows no bone involvement but it does show a large deep abscess plantar and medial to the left foot.  Patient's most recent white cell count is 8.6.  Albumin 2.1  Nasal swab MRSA is negative.  Blood cultures are negative.  Sed rate greater than 140.  Hemoglobin A1c 6.6.  Patient denies any recent gout symptoms.  He does not take medications for gout at this time.  CRP 20.1.  Assessment: Assessment: Diabetic neuropathy with a transmetatarsal amputation on the left with cellulitis and deep abscess of the left foot.  Plan: We will plan for foot salvage intervention with debridement of the foot abscess placement of an installation wound VAC and plan to follow-up on Friday for repeat debridement and possible biologic tissue graft.  Postoperative course was discussed risk and benefits were discussed patient states he understands wished to proceed at this time.  Thank you for the consult and the opportunity to see Robert Huang, [place] [phone] 7:00 AM

## 2021-10-24 NOTE — H&P (View-Only) (Signed)
ORTHOPAEDIC CONSULTATION  REQUESTING PHYSICIAN: Darliss Cheney, MD  Chief Complaint: Draining abscess left foot.  HPI: Robert Huang is a 61 y.o. male who presents with diabetic insensate neuropathy status post transmetatarsal amputation of the left foot status post great toe amputation of the right foot who presents with acute abscess and purulent drainage on the plantar medial aspect of the left foot.  Past Medical History:  Diagnosis Date   Anxiety    CAD (coronary artery disease)    sees Dr. Stanford Breed, normal Stress test 07-16-12    Diabetes mellitus    DJD (degenerative joint disease)    Gout    Hyperlipidemia    Hypertension    Hypothyroid    Osteoarthritis    Renal calculus    hx   Umbilical hernia    Past Surgical History:  Procedure Laterality Date   AMPUTATION  07/17/2012   Procedure: AMPUTATION RAY;  Surgeon: Wylene Simmer, MD;  Location: [facility];  Service: Orthopedics;  Laterality: Right;  right hallux amputation (1st ray resection )   AMPUTATION Left 03/06/2019   Procedure: Ray Amputation left foot;  Surgeon: Nicholes Stairs, MD;  Location: [facility];  Service: Orthopedics;  Laterality: Left;   CORONARY ANGIOPLASTY WITH STENT PLACEMENT     FINGER SURGERY     left index finger   I & D EXTREMITY  05/19/2012   Procedure: IRRIGATION AND DEBRIDEMENT EXTREMITY;  Surgeon: Marin Shutter, MD;  Location: [facility];  Service: Orthopedics;  Laterality: Right;  Right Great toe   I & D Toe  05/19/2012   rt great toe   TONSILLECTOMY     TRANSMETATARSAL AMPUTATION Left 12/10/2020   Procedure: TRANSMETATARSAL AMPUTATION LEFT;  Surgeon: Newt Minion, MD;  Location: [facility];  Service: Orthopedics;  Laterality: Left;   Social History   Socioeconomic History   Marital status: Married    Spouse name: Not on file   Number of children: Not on file   Years of education: Not on file   Highest education level: Not on file  Occupational History   Not on file  Tobacco Use    Smoking status: Never   Smokeless tobacco: Current    Types: Chew   Tobacco comments:    occ  Substance and Sexual Activity   Alcohol use: Yes    Alcohol/week: 0.0 standard drinks    Comment: weekends   Drug use: No   Sexual activity: Yes  Other Topics Concern   Not on file  Social History Narrative   Not on file   Social Determinants of Health   Financial Resource Strain: Not on file  Food Insecurity: Not on file  Transportation Needs: Not on file  Physical Activity: Not on file  Stress: Not on file  Social Connections: Not on file   Family History  Problem Relation Age of Onset   Heart attack Mother    Arthritis Other    Coronary artery disease Other    Diabetes Other    Hypertension Other    Prostate cancer Other    Stroke Other    - negative except otherwise stated in the family history section No Known Allergies Prior to Admission medications   Medication Sig Start Date End Date Taking? Authorizing Provider  acetaminophen (TYLENOL) 325 MG tablet Take 2 tablets (650 mg total) by mouth every 6 (six) hours as needed for mild pain (or Fever >/= 101). 03/09/19  Yes Marylyn Ishihara, [name], DO  ALPRAZolam Duanne Moron)  1 MG tablet Take 1 tablet (1 mg total) by mouth 3 (three) times daily as needed for anxiety. 05/09/21  Yes [name], MD  amLODipine (NORVASC) 10 MG tablet TAKE 1 TABLET (10 MG TOTAL) BY MOUTH DAILY. <PLEASE MAKE APPOINTMENT FOR REFILLS> Patient taking differently: Take 10 mg by mouth daily. 09/20/21  Yes [name], MD  Aspirin-Acetaminophen-Caffeine [phone] MG PACK Take 1 Package by mouth daily as needed (pain). 07/06/15  Yes [provider]  cloNIDine (CATAPRES) 0.3 MG tablet TAKE 1 TABLET (0.3 MG TOTAL) BY MOUTH 2 (TWO) TIMES DAILY. NEED OV. Patient taking differently: Take 0.3 mg by mouth 2 (two) times daily. 09/20/21  Yes [name], MD  cyanocobalamin (,VITAMIN B-12,) 1000 MCG/ML injection Inject 1 mL (1,000 mcg total) into the muscle once a  week. 12/19/20  Yes [name], MD  doxycycline (VIBRAMYCIN) 100 MG capsule Take 100 mg by mouth daily as needed (For infection). 08/22/21  Yes [provider]  ezetimibe (ZETIA) 10 MG tablet TAKE 1 TABLET BY MOUTH EVERY DAY Patient taking differently: Take 10 mg by mouth daily. 11/21/20  Yes [name], MD  fenofibrate 160 MG tablet TAKE 1 TABLET (160 MG TOTAL) BY MOUTH DAILY. NEED OV. Patient taking differently: Take 160 mg by mouth daily. 11/21/20  Yes [name], MD  ferrous sulfate 325 (65 FE) MG EC tablet Take 1 tablet (325 mg total) by mouth 2 (two) times daily. 12/11/20 12/11/21 Yes Ghimire, Henreitta Leber, MD  fluticasone (FLONASE) 50 MCG/ACT nasal spray Place 2 sprays into both nostrils daily. 08/13/20  Yes Hassell Done, Mary-Margaret, FNP  HYDROcodone-acetaminophen (NORCO) 10-325 MG tablet Take 1 tablet by mouth every 6 (six) hours as needed for moderate pain. 10/27/21 11/26/21 Yes [name], MD  indomethacin (INDOCIN SR) 75 MG CR capsule Take 1 capsule (75 mg total) by mouth daily as needed for moderate pain. 12/11/20  Yes Ghimire, Henreitta Leber, MD  levothyroxine (SYNTHROID) 112 MCG tablet TAKE 1 TABLET BY MOUTH EVERY DAY Patient taking differently: Take 112 mcg by mouth daily before breakfast. 09/20/21  Yes [name], MD  lisinopril-hydrochlorothiazide (ZESTORETIC) 20-12.5 MG tablet TAKE 1 TABLET BY MOUTH 2 (TWO) TIMES DAILY. 10AM AND 5PM 09/20/21  Yes [name], MD  metFORMIN (GLUCOPHAGE) 1000 MG tablet TAKE 1 TABLET BY MOUTH TWO TIMES A DAY WITH A MEAL Patient taking differently: Take 1,000 mg by mouth 2 (two) times daily with a meal. 07/31/21  Yes [name], MD  metoprolol succinate (TOPROL-XL) 100 MG 24 hr tablet TAKE 1 TABLET BY MOUTH TWICE A DAY Patient taking differently: Take 100 mg by mouth 2 (two) times daily. 09/20/21  Yes [name], MD  mupirocin ointment (BACTROBAN) 2 % Apply 1 application topically 2 (two) times daily. 12/19/20  Yes [name], MD   nitroGLYCERIN (NITROSTAT) 0.4 MG SL tablet Place 1 tablet (0.4 mg total) under the tongue every 5 (five) minutes as needed. For chest pain. Patient taking differently: Place 0.4 mg under the tongue every 5 (five) minutes as needed for chest pain. 05/21/18  Yes Lelon Perla, MD  omeprazole (PRILOSEC) 40 MG capsule TAKE 1 CAPSULE (40 MG TOTAL) BY MOUTH DAILY. Patient taking differently: Take 40 mg by mouth daily. 09/20/21  Yes [name], MD  [facility] test strip 1 EACH BY OTHER ROUTE DAILY. USE AS INSTRUCTED 06/16/21  Yes [name], MD  polyethylene glycol (MIRALAX) 17 g packet Take 17  g by mouth daily as needed. Patient taking differently: Take 17 g by mouth daily as needed for mild constipation. 12/11/20  Yes Ghimire, Henreitta Leber, MD  rosuvastatin (CRESTOR) 40 MG tablet Take 1 tablet (40 mg total) by mouth daily. 02/28/21  Yes [name], MD  zolpidem (AMBIEN) 10 MG tablet TAKE 1 TABLET BY MOUTH AT BEDTIME AS NEEDED FOR SLEEP Patient taking differently: Take 10 mg by mouth at bedtime. 08/22/21  Yes [name], MD  colchicine 0.6 MG tablet TAKE 1 TABLET (0.6 MG TOTAL) BY MOUTH EVERY 6 (SIX) HOURS AS NEEDED (GOUT). Patient not taking: Reported on 10/22/2021 02/22/20   [name], MD  famotidine (PEPCID) 40 MG tablet Take 1 tablet (40 mg total) by mouth daily. Patient not taking: Reported on 10/22/2021 12/19/20   [name], MD  glipiZIDE (GLUCOTROL) 5 MG tablet TAKE 1 TABLET BY MOUTH TWICE A DAY Patient not taking: Reported on 10/22/2021 01/03/21   [name], MD  Syringe/Needle, Disp, (SYRINGE 3CC/25GX1") 25G X 1" 3 ML MISC 1 application by Does not apply route once a week. 01/02/21   [name], MD   MR FOOT LEFT W WO CONTRAST  Result Date: 10/23/2021 CLINICAL DATA:  Nonhealing ulcer.  History of amputations. EXAM: MRI OF THE LEFT FOREFOOT WITHOUT AND WITH CONTRAST TECHNIQUE: Multiplanar, multisequence MR imaging of the left foot was performed both before and after  administration of intravenous contrast. CONTRAST:  9.29mL GADAVIST GADOBUTROL 1 MMOL/ML IV SOLN COMPARISON:  MRI 10/22/2020 and radiographs 10/22/2021 FINDINGS: Extensive subcutaneous soft tissue swelling/edema/fluid involving the medial and plantar aspect of the foot consistent with severe cellulitis. There also small multifocal abscesses. The largest abscess is on the plantar and medial aspect of the foot and measures 3 cm. It likely communicates with a small open wound. Two communicating abscesses in the medial soft tissues measure maximal 4.4 cm and slightly more superiorly there is a small abscess measuring 2.5 cm and this contains a small amount of gas. Associated myofasciitis involving the short flexor muscles, most notably the abductor hallucis muscle. I do not see any definite MR findings to suggest septic arthritis or osteomyelitis. Chronic tendinopathy involving the anterior tibialis tendon. IMPRESSION: 1. Extensive cellulitis and multifocal abscesses involving the medial and plantar aspect of the foot. 2. No definite MR findings to suggest septic arthritis or osteomyelitis. Electronically Signed   By: Marijo Sanes M.D.   On: 10/23/2021 17:55   DG Foot Complete Left  Result Date: 10/22/2021 CLINICAL DATA:  Diabetic foot infection. Right foot pain. Previous amputations. EXAM: LEFT FOOT - COMPLETE 3+ VIEW COMPARISON:  12/08/2020 FINDINGS: Previous amputations of all digits are seen at the level of the proximal metatarsals. No evidence of osteolysis or periostitis to suggest the presence of osteomyelitis. No evidence of fracture or dislocation. No evidence of soft tissue gas. Prominent plantar calcaneal bone spur noted. IMPRESSION: No radiographic evidence of osteomyelitis, soft tissue gas, or other acute findings. Electronically Signed   By: Marlaine Hind M.D.   On: 10/22/2021 16:53   - pertinent xrays, CT, MRI studies were reviewed and independently interpreted  Positive ROS: All other systems have  been reviewed and were otherwise negative with the exception of those mentioned in the HPI and as above.  Physical Exam: General: Alert, no acute distress Psychiatric: Patient is competent for consent with normal mood and affect Lymphatic: No axillary or cervical lymphadenopathy Cardiovascular: No pedal edema Respiratory: No cyanosis, no use of accessory  musculature GI: No organomegaly, abdomen is soft and non-tender    Images:  @ENCIMAGES @  Labs:  Lab Results  Component Value Date   HGBA1C 6.6 (H) 12/08/2020   HGBA1C 7.5 (H) 09/28/2020   HGBA1C 7.6 (A) 10/19/2019   ESRSEDRATE >140 (H) 10/22/2021   ESRSEDRATE 50 (H) 03/02/2019   ESRSEDRATE 63 (H) 07/17/2012   CRP 20.1 (H) 10/22/2021   CRP 2.9 (H) 03/02/2019   CRP 1.3 (H) 07/17/2012   LABURIC 7.2 12/03/2017   LABURIC 7.6 01/11/2017   LABURIC 12.7 (H) 11/05/2014   REPTSTATUS PENDING 10/22/2021   GRAMSTAIN  12/06/2020    FEW WBC PRESENT, PREDOMINANTLY PMN FEW GRAM POSITIVE COCCI IN PAIRS IN CLUSTERS    CULT  10/22/2021    NO GROWTH < 24 HOURS Performed at [facility] [address], [place], [place] [address]    LABORGA METHICILLIN RESISTANT STAPHYLOCOCCUS AUREUS 10/13/2020   LABORGA ENTEROCOCCUS FAECALIS 10/13/2020    Lab Results  Component Value Date   ALBUMIN 2.1 (L) 10/23/2021   ALBUMIN 3.0 (L) 12/08/2020   ALBUMIN 3.9 09/28/2020   LABURIC 7.2 12/03/2017   LABURIC 7.6 01/11/2017   LABURIC 12.7 (H) 11/05/2014     CBC EXTENDED Latest Ref Rng & Units 10/24/2021 10/23/2021 10/22/2021  WBC 4.0 - 10.5 K/uL 8.6 9.9 12.5(H)  RBC 4.22 - 5.81 MIL/uL 3.55(L) 3.14(L) 3.72(L)  HGB 13.0 - 17.0 g/dL 10.0(L) 9.0(L) 10.8(L)  HCT 39.0 - 52.0 % 31.7(L) 27.9(L) 33.4(L)  PLT 150 - 400 K/uL 265 223 277  NEUTROABS 1.7 - 7.7 K/uL 6.2 - 10.3(H)  LYMPHSABS 0.7 - 4.0 K/uL 1.0 - 0.8    Neurologic: Patient does not have protective sensation bilateral lower extremities.   MUSCULOSKELETAL:   Skin: Examination there is a  large superficial abscess plantar and medial to the left foot.  There is swelling and cellulitis up to the ankle.  Patient has a strong palpable dorsalis pedis pulse on the right.  Ankle-brachial indices 2 years ago showed triphasic flow with normal ABIs.  Review of the MRI scan shows no bone involvement but it does show a large deep abscess plantar and medial to the left foot.  Patient's most recent white cell count is 8.6.  Albumin 2.1  Nasal swab MRSA is negative.  Blood cultures are negative.  Sed rate greater than 140.  Hemoglobin A1c 6.6.  Patient denies any recent gout symptoms.  He does not take medications for gout at this time.  CRP 20.1.  Assessment: Assessment: Diabetic neuropathy with a transmetatarsal amputation on the left with cellulitis and deep abscess of the left foot.  Plan: We will plan for foot salvage intervention with debridement of the foot abscess placement of an installation wound VAC and plan to follow-up on Friday for repeat debridement and possible biologic tissue graft.  Postoperative course was discussed risk and benefits were discussed patient states he understands wished to proceed at this time.  Thank you for the consult and the opportunity to see Robert Huang, [place] [phone] 7:00 AM

## 2021-10-24 NOTE — Progress Notes (Signed)
°   10/24/21 1445  Clinical Encounter Type  Visited With Patient  Visit Type Initial;Spiritual support  Referral From Nurse  Consult/Referral To None   Chaplain visited patient with the intent of explaining an advanced directive. Patient explained that he had changed his mind on the advanced directive. Patient further shared some of his story and his hopes for discharge.  I advised patient that if he would like a chaplain visit before he leaves to let his nurse know.   [place] Resident  [facility] [phone]

## 2021-10-25 ENCOUNTER — Other Ambulatory Visit: Payer: Self-pay

## 2021-10-25 ENCOUNTER — Inpatient Hospital Stay (HOSPITAL_COMMUNITY): Payer: No Typology Code available for payment source | Admitting: Certified Registered Nurse Anesthetist

## 2021-10-25 ENCOUNTER — Encounter (HOSPITAL_COMMUNITY)
Admission: EM | Disposition: A | Discharge status: Home / Self Care | Payer: Self-pay | Source: Home / Self Care | Attending: Family Medicine

## 2021-10-25 ENCOUNTER — Encounter (HOSPITAL_COMMUNITY): Payer: Self-pay | Admitting: Internal Medicine

## 2021-10-25 DIAGNOSIS — E11628 Type 2 diabetes mellitus with other skin complications: Secondary | ICD-10-CM | POA: Diagnosis not present

## 2021-10-25 DIAGNOSIS — L089 Local infection of the skin and subcutaneous tissue, unspecified: Secondary | ICD-10-CM | POA: Diagnosis not present

## 2021-10-25 DIAGNOSIS — I2510 Atherosclerotic heart disease of native coronary artery without angina pectoris: Secondary | ICD-10-CM

## 2021-10-25 DIAGNOSIS — E1122 Type 2 diabetes mellitus with diabetic chronic kidney disease: Secondary | ICD-10-CM

## 2021-10-25 DIAGNOSIS — I129 Hypertensive chronic kidney disease with stage 1 through stage 4 chronic kidney disease, or unspecified chronic kidney disease: Secondary | ICD-10-CM

## 2021-10-25 DIAGNOSIS — L02612 Cutaneous abscess of left foot: Secondary | ICD-10-CM

## 2021-10-25 HISTORY — PX: I & D EXTREMITY: SHX5045

## 2021-10-25 LAB — BASIC METABOLIC PANEL
Anion gap: 7 (ref 5–15)
BUN: 14 mg/dL (ref 8–23)
CO2: 23 mmol/L (ref 22–32)
Calcium: 9 mg/dL (ref 8.9–10.3)
Chloride: 110 mmol/L (ref 98–111)
Creatinine, Ser: 1.04 mg/dL (ref 0.61–1.24)
GFR, Estimated: >60 mL/min (ref >60)
Glucose, Bld: 179 mg/dL — ABNORMAL HIGH (ref 70–99)
Potassium: 4.6 mmol/L (ref 3.5–5.1)
Sodium: 140 mmol/L (ref 135–145)

## 2021-10-25 LAB — GLUCOSE, CAPILLARY
Glucose-Capillary: 169 mg/dL — ABNORMAL HIGH (ref 70–99)
Glucose-Capillary: 288 mg/dL — ABNORMAL HIGH (ref 70–99)
Glucose-Capillary: 112 mg/dL — ABNORMAL HIGH (ref 70–99)
Glucose-Capillary: 63 mg/dL — ABNORMAL LOW (ref 70–99)
Glucose-Capillary: 123 mg/dL — ABNORMAL HIGH (ref 70–99)
Glucose-Capillary: 57 mg/dL — ABNORMAL LOW (ref 70–99)
Glucose-Capillary: 297 mg/dL — ABNORMAL HIGH (ref 70–99)

## 2021-10-25 LAB — MAGNESIUM: Magnesium: 1.5 mg/dL — ABNORMAL LOW (ref 1.7–2.4)

## 2021-10-25 LAB — RESP PANEL BY RT-PCR (FLU A&B, COVID) ARPGX2
Influenza A by PCR: NEGATIVE
Influenza B by PCR: NEGATIVE
SARS Coronavirus 2 by RT PCR: NEGATIVE

## 2021-10-25 LAB — SURGICAL PCR SCREEN
MRSA, PCR: NEGATIVE
Staphylococcus aureus: NEGATIVE

## 2021-10-25 SURGERY — IRRIGATION AND DEBRIDEMENT EXTREMITY
Anesthesia: General | Laterality: Left

## 2021-10-25 MED ORDER — EPHEDRINE SULFATE-NACL 50-0.9 MG/10ML-% IV SOSY
PREFILLED_SYRINGE | INTRAVENOUS | Status: DC | PRN
  Administered 2021-10-25 (×2): 5 mg via INTRAVENOUS

## 2021-10-25 MED ORDER — VANCOMYCIN HCL 2000 MG/400ML IV SOLN
2000.0000 mg | INTRAVENOUS | Status: DC
Start: 2021-10-25 — End: 2021-10-28
  Administered 2021-10-25 – 2021-10-27 (×3): 2000 mg via INTRAVENOUS
  Filled 2021-10-25 (×3): qty 400

## 2021-10-25 MED ORDER — DOCUSATE SODIUM 100 MG PO CAPS
100.0000 mg | ORAL_CAPSULE | Freq: Two times a day (BID) | ORAL | Status: DC
  Administered 2021-10-25 – 2021-10-28 (×4): 100 mg via ORAL
  Filled 2021-10-25 (×5): qty 1

## 2021-10-25 MED ORDER — OXYCODONE HCL 5 MG PO TABS: 5.0000 mg | ORAL_TABLET | Freq: Once | ORAL | Status: DC | PRN

## 2021-10-25 MED ORDER — EPHEDRINE 5 MG/ML INJ
INTRAVENOUS | Status: AC
  Filled 2021-10-25: qty 5

## 2021-10-25 MED ORDER — OXYCODONE HCL 5 MG PO TABS
10.0000 mg | ORAL_TABLET | ORAL | Status: DC | PRN
  Administered 2021-10-25 – 2021-10-28 (×9): 15 mg via ORAL
  Filled 2021-10-25 (×9): qty 3

## 2021-10-25 MED ORDER — ENSURE PRE-SURGERY PO LIQD
296.0000 mL | Freq: Once | ORAL | Status: AC
  Administered 2021-10-25: 296 mL via ORAL
  Filled 2021-10-25: qty 296

## 2021-10-25 MED ORDER — AMISULPRIDE (ANTIEMETIC) 5 MG/2ML IV SOLN: 10.0000 mg | Freq: Once | INTRAVENOUS | Status: DC | PRN

## 2021-10-25 MED ORDER — MIDAZOLAM HCL 2 MG/2ML IJ SOLN
INTRAMUSCULAR | Status: DC | PRN
  Administered 2021-10-25: 2 mg via INTRAVENOUS

## 2021-10-25 MED ORDER — METHOCARBAMOL 1000 MG/10ML IJ SOLN
500.0000 mg | Freq: Four times a day (QID) | INTRAVENOUS | Status: DC | PRN
  Filled 2021-10-25 (×3): qty 5

## 2021-10-25 MED ORDER — HYDROMORPHONE HCL 1 MG/ML IJ SOLN
0.5000 mg | INTRAMUSCULAR | Status: DC | PRN
  Administered 2021-10-25 – 2021-10-28 (×4): 1 mg via INTRAVENOUS
  Filled 2021-10-25 (×4): qty 1

## 2021-10-25 MED ORDER — ONDANSETRON HCL 4 MG/2ML IJ SOLN: 4.0000 mg | Freq: Four times a day (QID) | INTRAMUSCULAR | Status: DC | PRN

## 2021-10-25 MED ORDER — SODIUM CHLORIDE 0.9 % IV SOLN: INTRAVENOUS | Status: DC

## 2021-10-25 MED ORDER — FENTANYL CITRATE (PF) 100 MCG/2ML IJ SOLN
INTRAMUSCULAR | Status: AC
  Filled 2021-10-25: qty 2

## 2021-10-25 MED ORDER — OXYCODONE HCL 5 MG/5ML PO SOLN: 5.0000 mg | Freq: Once | ORAL | Status: DC | PRN

## 2021-10-25 MED ORDER — METHOCARBAMOL 500 MG PO TABS
500.0000 mg | ORAL_TABLET | Freq: Four times a day (QID) | ORAL | Status: DC | PRN
  Administered 2021-10-25: 500 mg via ORAL
  Filled 2021-10-25: qty 1

## 2021-10-25 MED ORDER — POLYETHYLENE GLYCOL 3350 17 G PO PACK: 17.0000 g | PACK | Freq: Every day | ORAL | Status: DC | PRN

## 2021-10-25 MED ORDER — BISACODYL 10 MG RE SUPP: 10.0000 mg | Freq: Every day | RECTAL | Status: DC | PRN

## 2021-10-25 MED ORDER — PROPOFOL 10 MG/ML IV BOLUS
INTRAVENOUS | Status: DC | PRN
  Administered 2021-10-25: 120 mg via INTRAVENOUS

## 2021-10-25 MED ORDER — METOCLOPRAMIDE HCL 5 MG PO TABS: 5.0000 mg | ORAL_TABLET | Freq: Three times a day (TID) | ORAL | Status: DC | PRN

## 2021-10-25 MED ORDER — FENTANYL CITRATE (PF) 100 MCG/2ML IJ SOLN
25.0000 ug | INTRAMUSCULAR | Status: DC | PRN
  Administered 2021-10-25: 50 ug via INTRAVENOUS
  Administered 2021-10-25 (×2): 25 ug via INTRAVENOUS

## 2021-10-25 MED ORDER — FENTANYL CITRATE (PF) 250 MCG/5ML IJ SOLN
INTRAMUSCULAR | Status: AC
  Filled 2021-10-25: qty 5

## 2021-10-25 MED ORDER — ONDANSETRON HCL 4 MG/2ML IJ SOLN: 4.0000 mg | Freq: Once | INTRAMUSCULAR | Status: DC | PRN

## 2021-10-25 MED ORDER — ONDANSETRON HCL 4 MG/2ML IJ SOLN
INTRAMUSCULAR | Status: DC | PRN
  Administered 2021-10-25: 4 mg via INTRAVENOUS

## 2021-10-25 MED ORDER — CHLORHEXIDINE GLUCONATE 0.12 % MT SOLN
OROMUCOSAL | Status: AC
  Filled 2021-10-25: qty 15

## 2021-10-25 MED ORDER — FENTANYL CITRATE (PF) 250 MCG/5ML IJ SOLN
INTRAMUSCULAR | Status: DC | PRN
  Administered 2021-10-25 (×2): 50 ug via INTRAVENOUS

## 2021-10-25 MED ORDER — LACTATED RINGERS IV SOLN: INTRAVENOUS | Status: DC

## 2021-10-25 MED ORDER — OXYCODONE HCL 5 MG PO TABS: 5.0000 mg | ORAL_TABLET | ORAL | Status: DC | PRN

## 2021-10-25 MED ORDER — MAGNESIUM SULFATE 2 GM/50ML IV SOLN
2.0000 g | Freq: Once | INTRAVENOUS | Status: AC
  Administered 2021-10-25: 2 g via INTRAVENOUS
  Filled 2021-10-25: qty 50

## 2021-10-25 MED ORDER — ONDANSETRON HCL 4 MG PO TABS: 4.0000 mg | ORAL_TABLET | Freq: Four times a day (QID) | ORAL | Status: DC | PRN

## 2021-10-25 MED ORDER — INSULIN ASPART 100 UNIT/ML IJ SOLN: 0.0000 [IU] | INTRAMUSCULAR | Status: DC | PRN

## 2021-10-25 MED ORDER — ACETAMINOPHEN 325 MG PO TABS: 325.0000 mg | ORAL_TABLET | Freq: Four times a day (QID) | ORAL | Status: DC | PRN

## 2021-10-25 MED ORDER — ORAL CARE MOUTH RINSE: 15.0000 mL | Freq: Once | OROMUCOSAL | Status: DC

## 2021-10-25 MED ORDER — LIDOCAINE 2% (20 MG/ML) 5 ML SYRINGE
INTRAMUSCULAR | Status: DC | PRN
  Administered 2021-10-25: 80 mg via INTRAVENOUS

## 2021-10-25 MED ORDER — DEXAMETHASONE SODIUM PHOSPHATE 10 MG/ML IJ SOLN
INTRAMUSCULAR | Status: DC | PRN
  Administered 2021-10-25: 5 mg via INTRAVENOUS

## 2021-10-25 MED ORDER — METOCLOPRAMIDE HCL 5 MG/ML IJ SOLN: 5.0000 mg | Freq: Three times a day (TID) | INTRAMUSCULAR | Status: DC | PRN

## 2021-10-25 MED ORDER — 0.9 % SODIUM CHLORIDE (POUR BTL) OPTIME
TOPICAL | Status: DC | PRN
  Administered 2021-10-25: 1000 mL

## 2021-10-25 MED ORDER — MIDAZOLAM HCL 2 MG/2ML IJ SOLN
INTRAMUSCULAR | Status: AC
  Filled 2021-10-25: qty 2

## 2021-10-25 MED ORDER — CHLORHEXIDINE GLUCONATE 0.12 % MT SOLN: 15.0000 mL | Freq: Once | OROMUCOSAL | Status: DC

## 2021-10-25 SURGICAL SUPPLY — 31 items
BAG COUNTER SPONGE SURGICOUNT (BAG) IMPLANT
BLADE SURG 21 STRL SS (BLADE) ×2 IMPLANT
BNDG COHESIVE 6X5 TAN STRL LF (GAUZE/BANDAGES/DRESSINGS) IMPLANT
BNDG GAUZE ELAST 4 BULKY (GAUZE/BANDAGES/DRESSINGS) ×4 IMPLANT
CANISTER WOUNDNEG PRESSURE 500 (CANNISTER) ×1 IMPLANT
COVER SURGICAL LIGHT HANDLE (MISCELLANEOUS) ×4 IMPLANT
DRAPE U-SHAPE 47X51 STRL (DRAPES) ×2 IMPLANT
DRSG ADAPTIC 3X8 NADH LF (GAUZE/BANDAGES/DRESSINGS) ×2 IMPLANT
DURAPREP 26ML APPLICATOR (WOUND CARE) ×2 IMPLANT
ELECT REM PT RETURN 9FT ADLT (ELECTROSURGICAL)
ELECTRODE REM PT RTRN 9FT ADLT (ELECTROSURGICAL) IMPLANT
GAUZE SPONGE 4X4 12PLY STRL (GAUZE/BANDAGES/DRESSINGS) ×2 IMPLANT
GLOVE SURG ORTHO LTX SZ9 (GLOVE) ×2 IMPLANT
GLOVE SURG UNDER POLY LF SZ9 (GLOVE) ×2 IMPLANT
GOWN STRL REUS W/ TWL XL LVL3 (GOWN DISPOSABLE) ×2 IMPLANT
GOWN STRL REUS W/TWL XL LVL3 (GOWN DISPOSABLE) ×4
HANDPIECE INTERPULSE COAX TIP (DISPOSABLE)
KIT BASIN OR (CUSTOM PROCEDURE TRAY) ×2 IMPLANT
KIT TURNOVER KIT B (KITS) ×2 IMPLANT
MANIFOLD NEPTUNE II (INSTRUMENTS) ×2 IMPLANT
NS IRRIG 1000ML POUR BTL (IV SOLUTION) ×2 IMPLANT
PACK ORTHO EXTREMITY (CUSTOM PROCEDURE TRAY) ×2 IMPLANT
PAD ARMBOARD 7.5X6 YLW CONV (MISCELLANEOUS) ×4 IMPLANT
SET HNDPC FAN SPRY TIP SCT (DISPOSABLE) IMPLANT
STOCKINETTE IMPERVIOUS 9X36 MD (GAUZE/BANDAGES/DRESSINGS) IMPLANT
SUT ETHILON 2 0 PSLX (SUTURE) ×2 IMPLANT
SWAB COLLECTION DEVICE MRSA (MISCELLANEOUS) ×2 IMPLANT
SWAB CULTURE ESWAB REG 1ML (MISCELLANEOUS) IMPLANT
TOWEL GREEN STERILE (TOWEL DISPOSABLE) ×2 IMPLANT
TUBE CONNECTING 12X1/4 (SUCTIONS) ×2 IMPLANT
YANKAUER SUCT BULB TIP NO VENT (SUCTIONS) ×2 IMPLANT

## 2021-10-25 NOTE — Consult Note (Addendum)
I have seen and examined the patient. I have personally reviewed the clinical findings, laboratory findings, microbiological data and imaging studies. The assessment and treatment plan was discussed with the Nurse Practitioner Janene Madeira. I agree with her/his recommendations except following additions/corrections.  Agree with discontinuing current antibiotics 48 hrs post trans-tibial amputation on Friday ID will sign off, Please call if any questions  Rosiland Oz, MD Infectious Disease Physician [facility] [address] [place],  [address] Phone: [phone]   Fax: [place] [facility] Infectious Disease    Date of Admission:  10/22/2021     Total days of antibiotics 3   Vancomycin  Cefepime  Metronidazole                Reason for Consult: DFU with Osteomyelitis     Referring Provider: Doristine Bosworth Primary Care Provider: [name], MD   Assessment: Robert Huang is a 61 y.o. male admitted for acute and abrupt onset new blistered appearing rash to the left inner ankle. He was taken to OR for management of what seemed on imaging to be only soft tissue infection but unfortunately this did track down to bone and will require transtibial amputation. Explained that this will result in surgical cure of current infection and will only need a ew days of treatment after amputation. Can stop antibiotics 48h after amputation.   He is anxious to go home but I suspect he will be here that time after surgery to coordinate wound care and potential rehab/home health needs for newly amputated limb.    Plan: Continue current antibiotics until surgery tomorrow Can stop after 48 hours post op  ID will sign off - please reach out if condition of the patient or plans change that would warrant re-evaluation of treatment plan.    Principal Problem:   Diabetic foot infection ([place]) Active Problems:    Hypothyroidism   Dyslipidemia   Anxiety state   Essential hypertension   Coronary atherosclerosis   Diabetes mellitus with complication (HCC)   Hypokalemia   History of complete ray amputation of fifth toe of left foot (HCC)   Status post amputation of right great toe (HCC)   Iron deficiency anemia   CKD (chronic kidney disease) stage 2, GFR 60-89 ml/min   Hyponatremia   Abscess of left foot   History of transmetatarsal amputation of left foot (HCC)   Severe protein-calorie malnutrition (HCC)    amLODipine  10 mg Oral Daily   cloNIDine  0.3 mg Oral BID   enoxaparin (LOVENOX) injection  40 mg Subcutaneous Q24H   ezetimibe  10 mg Oral Daily   fenofibrate  160 mg Oral Daily   insulin aspart  0-15 Units Subcutaneous TID WC   insulin aspart  0-5 Units Subcutaneous QHS   insulin glargine-yfgn  10 Units Subcutaneous Daily   levothyroxine  112 mcg Oral Daily   metoprolol succinate  100 mg Oral BID   pantoprazole  40 mg Oral Daily   rosuvastatin  40 mg Oral Daily   sodium chloride flush  10-40 mL Intracatheter Q12H   sodium chloride flush  3 mL Intravenous Q12H    HPI: Robert Huang is a 61 y.o. male admitted from home on 10/22/21 for management of foot pain and blisters.   PMHx notable for anemia, hypothyroidism, gout, HTN, HLD, diabetes, insensate neuropathy, anxiety, osteomyelitis s/p Rt great toe amputation and transmetatarsal amputation  to the left foot.   Current pain started in left foot about 4 days prior to admission that worsened to develop multiple blisters and redness with chills. He has had a plantar ulceration since last summer sometime. Started on vancomycin + zosyn in ER and changed to Vanc + cefepime + metronidazole.   Dr. Sharol Given is following the patient with ortho team and had excisional debridement of the left foot with application of wound vac on 3/1.   MRI: IMPRESSION: 1. Extensive cellulitis and multifocal abscesses involving the medial and plantar aspect  of the foot. 2. No definite MR findings to suggest septic arthritis or osteomyelitis.  Unfortunately in the OR setting the infection did track deeply to involve multiple bones in then heel/ankle joint and recommendations for transtibial amputation have been given with plans to proceed with this Friday. He has so far tolerated antibiotics well without side effects. Prior to surgery he was given a course of doxycycline to "take whenever he felt like he needed it." Took 2 doses and threw up bile.     Review of Systems: Review of Systems  Constitutional:  Negative for chills and fever.  HENT:  Negative for tinnitus.   Eyes:  Negative for blurred vision and photophobia.  Respiratory:  Negative for cough and sputum production.   Cardiovascular:  Negative for chest pain.  Gastrointestinal:  Positive for nausea and vomiting. Negative for abdominal pain and diarrhea.  Genitourinary:  Negative for dysuria.  Musculoskeletal:  Positive for joint pain (R foot pain).  Skin:  Negative for rash.  Neurological:  Negative for headaches.    Past Medical History:  Diagnosis Date   Anxiety    CAD (coronary artery disease)    sees Dr. Stanford Breed, normal Stress test 07-16-12    Diabetes mellitus    DJD (degenerative joint disease)    Gout    Hyperlipidemia    Hypertension    Hypothyroid    Osteoarthritis    Renal calculus    hx   Umbilical hernia    Past Surgical History:  Procedure Laterality Date   AMPUTATION  07/17/2012   Procedure: AMPUTATION RAY;  Surgeon: Wylene Simmer, MD;  Location: [facility];  Service: Orthopedics;  Laterality: Right;  right hallux amputation (1st ray resection )   AMPUTATION Left 03/06/2019   Procedure: Ray Amputation left foot;  Surgeon: Nicholes Stairs, MD;  Location: [facility];  Service: Orthopedics;  Laterality: Left;   CORONARY ANGIOPLASTY WITH STENT PLACEMENT     FINGER SURGERY     left index finger   I & D EXTREMITY  05/19/2012   Procedure: IRRIGATION AND  DEBRIDEMENT EXTREMITY;  Surgeon: Marin Shutter, MD;  Location: [facility];  Service: Orthopedics;  Laterality: Right;  Right Great toe   I & D EXTREMITY Left 10/25/2021   Procedure: LEFT FOOT [place];  Surgeon: Newt Minion, MD;  Location: [facility];  Service: Orthopedics;  Laterality: Left;   I & D Toe  05/19/2012   rt great toe   TONSILLECTOMY     TRANSMETATARSAL AMPUTATION Left 12/10/2020   Procedure: TRANSMETATARSAL AMPUTATION LEFT;  Surgeon: Newt Minion, MD;  Location: [facility];  Service: Orthopedics;  Laterality: Left;     Social History   Tobacco Use   Smoking status: Never   Smokeless tobacco: Current    Types: Chew   Tobacco comments:    occ  Substance Use Topics   Alcohol use: Yes    Alcohol/week: 0.0 standard drinks  Comment: weekends   Drug use: No    Family History  Problem Relation Age of Onset   Heart attack Mother    Arthritis Other    Coronary artery disease Other    Diabetes Other    Hypertension Other    Prostate cancer Other    Stroke Other    No Known Allergies  OBJECTIVE: Blood pressure (!) 155/72, pulse 62, temperature 97.7 F (36.5 C), temperature source Oral, resp. rate 16, height 6' (1.829 m), weight 90.5 kg, SpO2 100 %.  Physical Exam Vitals reviewed.  Constitutional:      Appearance: He is well-developed.     Comments: Seated comfortably in chair during visit.   HENT:     Mouth/Throat:     Dentition: Normal dentition. No dental abscesses.  Cardiovascular:     Rate and Rhythm: Normal rate and regular rhythm.     Heart sounds: Normal heart sounds.  Pulmonary:     Effort: Pulmonary effort is normal.     Breath sounds: Normal breath sounds.  Abdominal:     General: There is no distension.     Palpations: Abdomen is soft.     Tenderness: There is no abdominal tenderness.  Musculoskeletal:     Comments: Rt foot with wound vac and pressure wrap in place from OR. Clean and dry.   Lymphadenopathy:     Cervical: No cervical adenopathy.   Skin:    General: Skin is warm and dry.     Findings: No rash.  Neurological:     Mental Status: He is alert and oriented to person, place, and time.  Psychiatric:        Judgment: Judgment normal.    Lab Results Lab Results  Component Value Date   WBC 8.6 10/24/2021   HGB 10.0 (L) 10/24/2021   HCT 31.7 (L) 10/24/2021   MCV 89.3 10/24/2021   PLT 265 10/24/2021    Lab Results  Component Value Date   CREATININE 1.04 10/25/2021   BUN 14 10/25/2021   NA 140 10/25/2021   K 4.6 10/25/2021   CL 110 10/25/2021   CO2 23 10/25/2021    Lab Results  Component Value Date   ALT 10 10/23/2021   AST 9 (L) 10/23/2021   ALKPHOS 73 10/23/2021   BILITOT 0.3 10/23/2021     Microbiology: Recent Results (from the past 240 hour(s))  Culture, blood (routine x 2)     Status: None (Preliminary result)   Collection Time: 10/22/21  4:27 PM   Specimen: BLOOD  Result Value Ref Range Status   Specimen Description BLOOD LEFT ANTECUBITAL  Final   Special Requests   Final    BOTTLES DRAWN AEROBIC AND ANAEROBIC Blood Culture adequate volume   Culture   Final    NO GROWTH 3 DAYS Performed at [facility], [place] [address], [place], [place] [address]    Report Status PENDING  Incomplete  Culture, blood (routine x 2)     Status: None (Preliminary result)   Collection Time: 10/22/21  5:30 PM   Specimen: BLOOD  Result Value Ref Range Status   Specimen Description BLOOD LEFT ANTECUBITAL  Final   Special Requests   Final    BOTTLES DRAWN AEROBIC AND ANAEROBIC Blood Culture results may not be optimal due to an excessive volume of blood received in culture bottles   Culture   Final    NO GROWTH 3 DAYS Performed at [facility], [place] [address],  [place], [place] [address]    Report Status PENDING  Incomplete  MRSA Next Gen by PCR, Nasal     Status: None   Collection Time: 10/22/21  9:22 PM   Specimen: Nasal Mucosa; Nasal Swab  Result Value Ref Range Status   MRSA by PCR Next Gen NOT  DETECTED NOT DETECTED Final    Comment: (NOTE) The GeneXpert MRSA Assay (FDA approved for NASAL specimens only), is one component of a comprehensive MRSA colonization surveillance program. It is not intended to diagnose MRSA infection nor to guide or monitor treatment for MRSA infections. Test performance is not FDA approved in patients less than 41 years old. Performed at [facility], [place] [address] [place], [place] [address]   Surgical pcr screen     Status: None   Collection Time: 10/24/21 11:08 PM   Specimen: Nasal Mucosa; Nasal Swab  Result Value Ref Range Status   MRSA, PCR NEGATIVE NEGATIVE Final   Staphylococcus aureus NEGATIVE NEGATIVE Final    Comment: (NOTE) The Xpert SA Assay (FDA approved for NASAL specimens in patients 35 years of age and older), is one component of a comprehensive surveillance program. It is not intended to diagnose infection nor to guide or monitor treatment. Performed at [facility], [place] [address], [place], [place] [address]    Imaging MR FOOT LEFT W WO CONTRAST  Result Date: 10/23/2021 CLINICAL DATA:  Nonhealing ulcer.  History of amputations. EXAM: MRI OF THE LEFT FOREFOOT WITHOUT AND WITH CONTRAST TECHNIQUE: Multiplanar, multisequence MR imaging of the left foot was performed both before and after administration of intravenous contrast. CONTRAST:  9.9mL GADAVIST GADOBUTROL 1 MMOL/ML IV SOLN COMPARISON:  MRI 10/22/2020 and radiographs 10/22/2021 FINDINGS: Extensive subcutaneous soft tissue swelling/edema/fluid involving the medial and plantar aspect of the foot consistent with severe cellulitis. There also small multifocal abscesses. The largest abscess is on the plantar and medial aspect of the foot and measures 3 cm. It likely communicates with a small open wound. Two communicating abscesses in the medial soft tissues measure maximal 4.4 cm and slightly more superiorly there is a small abscess measuring 2.5 cm and this contains a small  amount of gas. Associated myofasciitis involving the short flexor muscles, most notably the abductor hallucis muscle. I do not see any definite MR findings to suggest septic arthritis or osteomyelitis. Chronic tendinopathy involving the anterior tibialis tendon. IMPRESSION: 1. Extensive cellulitis and multifocal abscesses involving the medial and plantar aspect of the foot. 2. No definite MR findings to suggest septic arthritis or osteomyelitis. Electronically Signed   By: Marijo Sanes M.D.   On: 10/23/2021 17:55   DG Foot Complete Left  Result Date: 10/22/2021 CLINICAL DATA:  Diabetic foot infection. Right foot pain. Previous amputations. EXAM: LEFT FOOT - COMPLETE 3+ VIEW COMPARISON:  12/08/2020 FINDINGS: Previous amputations of all digits are seen at the level of the proximal metatarsals. No evidence of osteolysis or periostitis to suggest the presence of osteomyelitis. No evidence of fracture or dislocation. No evidence of soft tissue gas. Prominent plantar calcaneal bone spur noted. IMPRESSION: No radiographic evidence of osteomyelitis, soft tissue gas, or other acute findings. Electronically Signed   By: Marlaine Hind M.D.   On: 10/22/2021 16:53      Janene Madeira, MSN, NP-C [place] for Infectious Disease [email or web address] Pager: [phone] Office: [place]: [place] Communication Welcome   10/25/2021 11:48 AM

## 2021-10-25 NOTE — Anesthesia Preprocedure Evaluation (Signed)
Anesthesia Evaluation  ?Patient identified by MRN, date of birth, ID band ?Patient awake ? ? ? ?Reviewed: ?Allergy & Precautions, NPO status , Patient's Chart, lab work & pertinent test results ? ?Airway ?Mallampati: II ? ?TM Distance: >3 FB ?Neck ROM: Full ? ? ? Dental ?no notable dental hx. ? ?  ?Pulmonary ?neg pulmonary ROS,  ?  ?Pulmonary exam normal ?breath sounds clear to auscultation ? ? ? ? ? ? Cardiovascular ?hypertension, Pt. on medications ?+ CAD  ?Normal cardiovascular exam ?Rhythm:Regular Rate:Normal ? ? ?  ?Neuro/Psych ?PSYCHIATRIC DISORDERS Anxiety negative neurological ROS ?   ? GI/Hepatic ?negative GI ROS, Neg liver ROS,   ?Endo/Other  ?diabetes, Poorly Controlled, Type 2Hypothyroidism  ? Renal/GU ?CRF and Renal InsufficiencyRenal disease  ?negative genitourinary ?  ?Musculoskeletal ? ?(+) Arthritis , Osteoarthritis,   ? Abdominal ?  ?Peds ?negative pediatric ROS ?(+)  Hematology ? ?(+) Blood dyscrasia, anemia ,   ?Anesthesia Other Findings ? ? Reproductive/Obstetrics ?negative OB ROS ? ?  ? ? ? ? ? ? ? ? ? ? ? ? ? ?  ?  ? ? ? ? ? ? ? ? ?Anesthesia Physical ?Anesthesia Plan ? ?ASA: 3 ? ?Anesthesia Plan: General  ? ?Post-op Pain Management:   ? ?Induction: Intravenous ? ?PONV Risk Score and Plan: 2 and Treatment may vary due to age or medical condition, Ondansetron and Dexamethasone ? ?Airway Management Planned: LMA ? ?Additional Equipment:  ? ?Intra-op Plan:  ? ?Post-operative Plan: Extubation in OR ? ?Informed Consent: I have reviewed the patients History and Physical, chart, labs and discussed the procedure including the risks, benefits and alternatives for the proposed anesthesia with the patient or authorized representative who has indicated his/her understanding and acceptance.  ? ? ? ?Dental advisory given ? ?Plan Discussed with: CRNA, Anesthesiologist and Surgeon ? ?Anesthesia Plan Comments:   ? ? ? ? ? ? ?Anesthesia Quick Evaluation ? ?

## 2021-10-25 NOTE — Transfer of Care (Signed)
Immediate Anesthesia Transfer of Care Note ? ?Patient: Robert Huang [id number] ? ?Procedure(s) Performed: LEFT FOOT DEDRIDEMENT (Left) ? ?Patient Location: PACU ? ?Anesthesia Type:General ? ?Level of Consciousness: awake, alert  and oriented ? ?Airway & Oxygen Therapy: Patient Spontanous Breathing ? ?Post-op Assessment: Report given to RN and Post -op Vital signs reviewed and stable ? ?Post vital signs: Reviewed and stable ? ?Last Vitals:  ?Vitals Value Taken Time  ?BP 124/65 10/25/21 1616  ?Temp    ?Pulse 58 10/25/21 1618  ?Resp 16 10/25/21 1618  ?SpO2 98 % 10/25/21 1618  ?Vitals shown include unvalidated device data. ? ?Last Pain:  ?Vitals:  ? 10/25/21 1423  ?TempSrc:   ?PainSc: 8   ?   ? ?Patients Stated Pain Goal: 0 (10/25/21 0101) ? ?Complications: No notable events documented. ?

## 2021-10-25 NOTE — Progress Notes (Addendum)
Pharmacy Antibiotic Note ? ?Robert Huang is a 61 y.o. male admitted on 10/22/2021 with  foot infection . Patient with a history of transmetatarsal amputation of his left foot about a year ago and DM.  Planning I&D later today. ? ?  Day # 4 Vancomycin, Cefepime and Metronidazole ?  Creatinine 1.39 on admit, above baseline, now 1.04. ? - eAUC 485 using Scr 1.39, now lower with Scr 1.04 ? ?Plan: ?Increase Vancomycin 1750 mg > 2gm q24hr - daily dose due at 10pm ?- eAUC 424 using Scr 1.04 ?Cefepime 2g IV q8h ?Flagyl 500 mg IV q12h per MD ?Will follow renal function, culture data, clinical progress, antibiotic plans. ?Defer Vanc levels for now, but will consider later based on expected length of therapy.  ? ? ?Height: 6' (182.9 cm) ?Weight: 90.5 kg (199 lb 9.6 oz) ?IBW/kg (Calculated) : 77.6 ? ?Temp (24hrs), Avg:98.4 ?F (36.9 ?C), Min:97.7 ?F (36.5 ?C), Max:99.2 ?F (37.3 ?C) ? ?Recent Labs  ?Lab 10/22/21 ?1710 10/23/21 ?0354 10/24/21 ?5726 10/25/21 ?0912  ?WBC 12.5* 9.9 8.6  --   ?CREATININE 1.39* 1.30* 1.03 1.04  ?  ?Estimated Creatinine Clearance: 81.9 mL/min (by C-G formula based on SCr of 1.04 mg/dL).   ? ?No Known Allergies ? ?Antimicrobials this admission: ?vancomycin 2/26 >>  ?cefepime 2/26 >>   ?metronidazole 2/26 >> ? ?Dose adjustments this admission: ? 3/1: Vanc 1750 mg > 2gm IV q24h for improved SCr ? ?Microbiology results: ?2/26 blood: no growth x 2 days to date ?2/26 MRSA PCR: neg ?2/28 surgical PCR: neg ? ?Thank you for allowing pharmacy to be a part of this patient?s care. ? ?Dennie Fetters, RPh ?10/25/2021 10:25 AM ? ?

## 2021-10-25 NOTE — Anesthesia Procedure Notes (Signed)
Procedure Name: LMA Insertion ?Date/Time: 10/25/2021 3:40 PM ?Performed by: Garfield Cornea, CRNA ?Pre-anesthesia Checklist: Patient identified, Emergency Drugs available, Suction available and Patient being monitored ?Patient Re-evaluated:Patient Re-evaluated prior to induction ?Oxygen Delivery Method: Circle System Utilized ?Preoxygenation: Pre-oxygenation with 100% oxygen ?Induction Type: IV induction ?LMA: LMA inserted ?LMA Size: 4.0 ?Number of attempts: 1 ?Placement Confirmation: positive ETCO2 ?Tube secured with: Tape ?Dental Injury: Teeth and Oropharynx as per pre-operative assessment  ? ? ? ? ?

## 2021-10-25 NOTE — Progress Notes (Signed)
ID Brief Progress Note:  ? ?Robert Huang is a 61 y.o. male admitted from home on 10/22/21 for management of foot pain and blisters.  ?  ?PMHx notable for anemia, hypothyroidism, gout, HTN, HLD, diabetes, insensate neuropathy, anxiety, osteomyelitis s/p Rt great toe amputation and transmetatarsal amputation to the left foot ? ?MRI suggests soft tissue abscess and no involvement of joint/bones.  ? ?Plans noted for surgery this afternoon. Patient has been on empiric vancomycin, cefepime and metronidazole for this infection. Will see the patient tomorrow in consultation after we have gram stain from operative cultures and operative note available to [place].  ? ? ?Rexene Alberts, MSN, NP-C ?[facility] ?[facility]  [email or web address] ?Pager: [phone] ?Office: [phone] ?[facility] Main Line: [phone] ?*Secure Chat Communication Welcome  ?

## 2021-10-25 NOTE — Anesthesia Postprocedure Evaluation (Signed)
Anesthesia Post Note ? ?Patient: Robert Huang [id number] ? ?Procedure(s) Performed: LEFT FOOT DEDRIDEMENT (Left) ? ?  ? ?Patient location during evaluation: PACU ?Anesthesia Type: General ?Level of consciousness: awake ?Pain management: pain level controlled ?Vital Signs Assessment: post-procedure vital signs reviewed and stable ?Respiratory status: spontaneous breathing and respiratory function stable ?Cardiovascular status: stable ?Postop Assessment: no apparent nausea or vomiting ?Anesthetic complications: no ? ? ?No notable events documented. ? ?Last Vitals:  ?Vitals:  ? 10/25/21 1426 10/25/21 1615  ?BP:  124/65  ?Pulse:  (!) 58  ?Resp:    ?Temp: 36.5 ?C 36.9 ?C  ?SpO2:  96%  ?  ?Last Pain:  ?Vitals:  ? 10/25/21 1423  ?TempSrc:   ?PainSc: 8   ? ? ?  ?  ?  ?  ?  ?  ? ?Mellody Dance ? ? ? ? ?

## 2021-10-25 NOTE — Op Note (Addendum)
10/25/2021 ? ?4:38 PM ? ?PATIENT:  Robert Huang   ? ?PRE-OPERATIVE DIAGNOSIS:  Abscess Left Foot ? ?POST-OPERATIVE DIAGNOSIS:  Same ? ?PROCEDURE:  LEFT FOOT DEDRIDEMENT ?Excision of skin and soft tissue muscle and fascia. ?Application of cleanse choice wound VAC sponge x1 ? ?SURGEON:  [name], MD ? ?PHYSICIAN ASSISTANT:None ?ANESTHESIA:   General ? ?PREOPERATIVE INDICATIONS:  [name] is a  61 y.o. male with a diagnosis of Abscess Left Foot who failed conservative measures and elected for surgical management.   ? ?The risks benefits and alternatives were discussed with the patient preoperatively including but not limited to the risks of infection, bleeding, nerve injury, cardiopulmonary complications, the need for revision surgery, among others, and the patient was willing to proceed. ? ?OPERATIVE IMPLANTS: Wound VAC sponge. ? ?@ENCIMAGES @ ? ?OPERATIVE FINDINGS: Deep purulent abscess with necrotic foul-smelling tissue.  Tissue was sent for cultures. ? ?OPERATIVE PROCEDURE: Patient was brought the operating room and underwent a general anesthetic.  After adequate levels anesthesia were obtained patient's left lower extremity was prepped using DuraPrep draped into a sterile field a timeout was called.  A large elliptical incision was made around the necrotic tissue in the medial aspect of the foot.  This left a wound that was 13 x 7 cm.  All necrotic tissue was excised.  This ulcer did extend down to bone.  Concerning for bone infection that was not visible on the MRI scan.  After tissue margins were cleansed the wound was irrigated normal saline to cleanse choice wound VAC was applied this had a good suction fit patient was extubated taken the PACU in stable condition. ? ?Debridement type: Excisional Debridement ? ?Side: left ? ?Body Location: foot  ? ?Tools used for debridement: scalpel and rongeur ? ?Pre-debridement Wound size (cm):   Length: 5        Width: 5     Depth: 1   ? ?Post-debridement Wound size (cm):   Length: 13        Width: 7     Depth: 3  ? ?Debridement depth beyond dead/damaged tissue down to healthy viable tissue: yes ? ?Tissue layer involved: skin, subcutaneous tissue, muscle / fascia, bone ? ?Nature of tissue removed: Slough, Necrotic, Devitalized Tissue, Non-viable tissue, and Purulence ? ?Irrigation volume: 1 liter    ? ?Irrigation fluid type: Normal Saline ? ? ? ? ?DISCHARGE PLANNING: ? ?Antibiotic duration: Continue IV antibiotics adjust according to cultures ? ?Weightbearing: Nonweightbearing on the left foot ? ?Pain medication: Opioid pathway ? ?Dressing care/ Wound VAC: Continue wound VAC ? ?Ambulatory devices: Walker or crutches ? ?Next step: Plan for return to the operating room on Friday for repeat debridement and tissue grafting versus the possibility of a transtibial amputation. ? ?Follow-up: In the office 1 week post operative. ? ? ? ? ? ? ?  ?

## 2021-10-25 NOTE — Progress Notes (Signed)
PROGRESS NOTE    [name]  PXT:[id number] DOB: Jun 27, 1961 DOA: 10/22/2021 PCP: [name], MD   Brief Narrative:  Robert Huang is a 61 y.o. male with medical history significant of anemia, hypothyroidism, gout, hypertension, hyperlipidemia, diabetes, anxiety, osteomyelitis status post right great toe amputation and status post left ray amputation presented with left foot pain and eventually was diagnosed with deep multiple abscesses.  He was started on broad-spectrum antibiotics from admission, admitted to hospital service and orthopedics consulted.  He was hemodynamically stable upon presentation  Assessment & Plan:   Principal Problem:   Diabetic foot infection ([place]) Active Problems:   Hypothyroidism   Dyslipidemia   Anxiety state   Essential hypertension   Coronary atherosclerosis   Diabetes mellitus with complication (HCC)   Hypokalemia   History of complete ray amputation of fifth toe of left foot (HCC)   Status post amputation of right great toe (HCC)   Iron deficiency anemia   CKD (chronic kidney disease) stage 2, GFR 60-89 ml/min   Hyponatremia   Abscess of left foot   History of transmetatarsal amputation of left foot (HCC)   Severe protein-calorie malnutrition (HCC)  Sepsis secondary to diabetic left foot infection/deep abscesses with previous history of left ray amputation: Patient did not have sepsis upon presentation.  Met criteria for sepsis after admission based on white cells of 4.5 and fever of 100.7 that developed after admission.  Seen by orthopedics, MRI confirmed deep multiple abscesses in the left foot but no osteomyelitis.  Seen by Dr. Sharol Given, plan for debridement today and then possibly repeat debridement on Friday with biological tissue graft.  We will continue current broad-spectrum antibiotics.  Blood culture negative.  I have consulted ID for guidance of antibiotic choice and duration.   Hypomagnesemia: Low again, will  replace.  AKI: Patient had mild AKI which resolved.  Please note that patient does not have CKD as mentioned in previous notes.  Hypokalemia: Resolved.  Hyponatremia: Resolved.  Anemia of chronic disease: Hemoglobin is stable.  Hypothyroidism: Continue Synthroid.  Essential hypertension: Blood pressure controlled.  Continue home amlodipine, clonidine, senna pro-hydrochlorothiazide, metoprolol   CAD/hyperlipidemia: Asymptomatic. - Continue home Zetia, fenofibrate, rosuvastatin  Diabetes melitis type II: Appears to be taking metformin at home which is on hold.  Blood sugar now better controlled.  Continue 10 units of Semglee as well as SSI.  Anxiety - Continue as needed Xanax      DVT prophylaxis: enoxaparin (LOVENOX) injection 40 mg Start: 10/22/21 1830   Code Status: Full Code  Family Communication:  None present at bedside.  Plan of care discussed with patient in length and he/she verbalized understanding and agreed with it.  Status is: Inpatient Remains inpatient appropriate because: Needs surgical procedure.  Estimated body mass index is 27.07 kg/m as calculated from the following:   Height as of this encounter: 6' (1.829 m).   Weight as of this encounter: 90.5 kg.    Nutritional Assessment: Body mass index is 27.07 kg/m.[name] Seen by dietician.  I agree with the assessment and plan as outlined below: Nutrition Status:        . Skin Assessment: I have examined the patient's skin and I agree with the wound assessment as performed by the wound care RN as outlined below:    Consultants:  Orthopedics ID Procedures:  None  Antimicrobials:  Anti-infectives (From admission, onward)    Start     Dose/Rate Route Frequency Ordered Stop   10/23/21 2200  vancomycin (VANCOREADY) IVPB 1750 mg/350 mL        1,750 mg 175 mL/hr over 120 Minutes Intravenous Every 24 hours 10/22/21 2124     10/22/21 1845  ceFEPIme (MAXIPIME) 2 g in sodium chloride 0.9 % 100 mL IVPB         2 g 200 mL/hr over 30 Minutes Intravenous Every 8 hours 10/22/21 1838     10/22/21 1830  metroNIDAZOLE (FLAGYL) IVPB 500 mg        500 mg 100 mL/hr over 60 Minutes Intravenous Every 12 hours 10/22/21 1823     10/22/21 1815  vancomycin (VANCOREADY) IVPB 2000 mg/400 mL        2,000 mg 200 mL/hr over 120 Minutes Intravenous  Once 10/22/21 1805 10/22/21 2331   10/22/21 1800  piperacillin-tazobactam (ZOSYN) IVPB 3.375 g  Status:  Discontinued        3.375 g 100 mL/hr over 30 Minutes Intravenous  Once 10/22/21 1756 10/22/21 1823         Subjective: Seen and examined.  Complains of left foot pain but that is improving.  He is motivated for the surgery today.  Objective: Vitals:   10/24/21 1651 10/24/21 2012 10/25/21 0428 10/25/21 0922  BP: 137/65 (!) 157/60 131/80 (!) 155/72  Pulse: (!) 59 63 62   Resp: _0 Temp: 98.6 F (37 C) 99.2 F (37.3 C) 97.9 F (36.6 C) 97.7 F (36.5 C)  TempSrc:  Oral  Oral  SpO2: 99% 100% 99% 100%  Weight:  90.5 kg    Height:  6' (1.829 m)      Intake/Output Summary (Last 24 hours) at 10/25/2021 1013 Last data filed at 10/25/2021 0900 Gross per 24 hour  Intake 1470.13 ml  Output 1950 ml  Net -479.87 ml    Filed Weights   10/22/21 1606 10/24/21 2012  Weight: 90.7 kg 90.5 kg    Examination:  General exam: Appears calm and comfortable  Respiratory system: Clear to auscultation. Respiratory effort normal. Cardiovascular system: S1 & S2 heard, RRR. No JVD, murmurs, rubs, gallops or clicks. No pedal edema. Gastrointestinal system: Abdomen is nondistended, soft and nontender. No organomegaly or masses felt. Normal bowel sounds heard. Central nervous system: Alert and oriented. No focal neurological deficits. Extremities: Symmetric 5 x 5 power. Skin: Dressing in the left foot. Psychiatry: Judgement and insight appear normal. Mood & affect appropriate.   Data Reviewed: I have personally reviewed following labs and imaging  studies  CBC: Recent Labs  Lab 10/22/21 1710 10/23/21 0354 10/24/21 0435  WBC 12.5* 9.9 8.6  NEUTROABS 10.3*  --  6.2  HGB 10.8* 9.0* 10.0*  HCT 33.4* 27.9* 31.7*  MCV 89.8 88.9 89.3  PLT 277 223 426    Basic Metabolic Panel: Recent Labs  Lab 10/22/21 1710 10/23/21 0354 10/24/21 0435 10/25/21 0912  NA 132* 135 140 140  K 3.2* 3.7 5.1 4.6  CL 101 105 109 110  CO2 16* 20* 22 23  GLUCOSE 172* 220* 151* 179*  BUN 29* 28* 18 14  CREATININE 1.39* 1.30* 1.03 1.04  CALCIUM 8.4* 7.8* 8.7* 9.0  MG 1.4* 1.4* 1.6* 1.5*    GFR: Estimated Creatinine Clearance: 81.9 mL/min (by C-G formula based on SCr of 1.04 mg/dL). Liver Function Tests: Recent Labs  Lab 10/23/21 0354  AST 9*  ALT 10  ALKPHOS 73  BILITOT 0.3  PROT 5.8*  ALBUMIN 2.1*    No results for input(s): LIPASE, AMYLASE in the last 168  hours. No results for input(s): AMMONIA in the last 168 hours. Coagulation Profile: No results for input(s): INR, PROTIME in the last 168 hours. Cardiac Enzymes: No results for input(s): CKTOTAL, CKMB, CKMBINDEX, TROPONINI in the last 168 hours. BNP (last 3 results) No results for input(s): PROBNP in the last 8760 hours. HbA1C: Recent Labs    10/24/21 0435  HGBA1C 7.2*    CBG: Recent Labs  Lab 10/24/21 0723 10/24/21 1136 10/24/21 1650 10/24/21 2241 10/25/21 0727  GLUCAP 178* 157* 194* 154* 169*    Lipid Profile: No results for input(s): CHOL, HDL, LDLCALC, TRIG, CHOLHDL, LDLDIRECT in the last 72 hours. Thyroid Function Tests: No results for input(s): TSH, T4TOTAL, FREET4, T3FREE, THYROIDAB in the last 72 hours. Anemia Panel: No results for input(s): VITAMINB12, FOLATE, FERRITIN, TIBC, IRON, RETICCTPCT in the last 72 hours. Sepsis Labs: No results for input(s): PROCALCITON, LATICACIDVEN in the last 168 hours.  Recent Results (from the past 240 hour(s))  Culture, blood (routine x 2)     Status: None (Preliminary result)   Collection Time: 10/22/21  4:27 PM    Specimen: BLOOD  Result Value Ref Range Status   Specimen Description BLOOD LEFT ANTECUBITAL  Final   Special Requests   Final    BOTTLES DRAWN AEROBIC AND ANAEROBIC Blood Culture adequate volume   Culture   Final    NO GROWTH 2 DAYS Performed at [facility], [address], [place], [place] [address]    Report Status PENDING  Incomplete  Culture, blood (routine x 2)     Status: None (Preliminary result)   Collection Time: 10/22/21  5:30 PM   Specimen: BLOOD  Result Value Ref Range Status   Specimen Description BLOOD LEFT ANTECUBITAL  Final   Special Requests   Final    BOTTLES DRAWN AEROBIC AND ANAEROBIC Blood Culture results may not be optimal due to an excessive volume of blood received in culture bottles   Culture   Final    NO GROWTH 2 DAYS Performed at [facility], [place] [address], [place], [place] [address]    Report Status PENDING  Incomplete  MRSA Next Gen by PCR, Nasal     Status: None   Collection Time: 10/22/21  9:22 PM   Specimen: Nasal Mucosa; Nasal Swab  Result Value Ref Range Status   MRSA by PCR Next Gen NOT DETECTED NOT DETECTED Final    Comment: (NOTE) The GeneXpert MRSA Assay (FDA approved for NASAL specimens only), is one component of a comprehensive MRSA colonization surveillance program. It is not intended to diagnose MRSA infection nor to guide or monitor treatment for MRSA infections. Test performance is not FDA approved in patients less than 31 years old. Performed at [facility], [place] [address] [place], [place] [address]   Surgical pcr screen     Status: None   Collection Time: 10/24/21 11:08 PM   Specimen: Nasal Mucosa; Nasal Swab  Result Value Ref Range Status   MRSA, PCR NEGATIVE NEGATIVE Final   Staphylococcus aureus NEGATIVE NEGATIVE Final    Comment: (NOTE) The Xpert SA Assay (FDA approved for NASAL specimens in patients 97 years of age and older), is one component of a comprehensive surveillance program. It is not  intended to diagnose infection nor to guide or monitor treatment. Performed at [facility], [place] [address] [place], [place] [address]       Radiology Studies: MR FOOT LEFT W WO CONTRAST  Result Date:  10/23/2021 CLINICAL DATA:  Nonhealing ulcer.  History of amputations. EXAM: MRI OF THE LEFT FOREFOOT WITHOUT AND WITH CONTRAST TECHNIQUE: Multiplanar, multisequence MR imaging of the left foot was performed both before and after administration of intravenous contrast. CONTRAST:  9.27m GADAVIST GADOBUTROL 1 MMOL/ML IV SOLN COMPARISON:  MRI 10/22/2020 and radiographs 10/22/2021 FINDINGS: Extensive subcutaneous soft tissue swelling/edema/fluid involving the medial and plantar aspect of the foot consistent with severe cellulitis. There also small multifocal abscesses. The largest abscess is on the plantar and medial aspect of the foot and measures 3 cm. It likely communicates with a small open wound. Two communicating abscesses in the medial soft tissues measure maximal 4.4 cm and slightly more superiorly there is a small abscess measuring 2.5 cm and this contains a small amount of gas. Associated myofasciitis involving the short flexor muscles, most notably the abductor hallucis muscle. I do not see any definite MR findings to suggest septic arthritis or osteomyelitis. Chronic tendinopathy involving the anterior tibialis tendon. IMPRESSION: 1. Extensive cellulitis and multifocal abscesses involving the medial and plantar aspect of the foot. 2. No definite MR findings to suggest septic arthritis or osteomyelitis. Electronically Signed   By: PMarijo SanesM.D.   On: 10/23/2021 17:55    Scheduled Meds:  amLODipine  10 mg Oral Daily   cloNIDine  0.3 mg Oral BID   enoxaparin (LOVENOX) injection  40 mg Subcutaneous Q24H   ezetimibe  10 mg Oral Daily   fenofibrate  160 mg Oral Daily   insulin aspart  0-15 Units Subcutaneous TID WC   insulin aspart  0-5 Units Subcutaneous QHS   insulin glargine-yfgn   10 Units Subcutaneous Daily   levothyroxine  112 mcg Oral Daily   metoprolol succinate  100 mg Oral BID   pantoprazole  40 mg Oral Daily   rosuvastatin  40 mg Oral Daily   sodium chloride flush  10-40 mL Intracatheter Q12H   sodium chloride flush  3 mL Intravenous Q12H   Continuous Infusions:  sodium chloride 10 mL/hr at 10/23/21 2209   ceFEPime (MAXIPIME) IV 2 g (10/24/21 2125)   metronidazole 500 mg (10/25/21 0907)   vancomycin 1,750 mg (10/24/21 2249)     LOS: 2 days   Time spent: 26 minutes  RDarliss Cheney MD [facility]  10/25/2021, 10:13 AM  Please page via AShea Evansand do not message via secure chat for urgent patient care matters. Secure chat can be used for non urgent patient care matters.  How to contact the TOs[facility] HospitalAttending or Consulting provider 7[place]or covering provider during after hours [place] for this patient?  Check the care team in [facility] LLPand look for a) attending/consulting TRH provider listed and b) the TBeraja Healthcare Corporationteam listed. Page or secure chat [phone]. Log into [email or web address] and use [place]'s universal password to access. If you do not have the password, please contact the hospital operator. Locate the TAlta Rose Surgery Centerprovider you are looking for under [facility] and page to a number that you can be directly reached. If you still have difficulty reaching the provider, please page the [facility](Director on Call) for the Hospitalists listed on amion for assistance.

## 2021-10-25 NOTE — Plan of Care (Signed)
  Problem: Nutrition: Goal: Adequate nutrition will be maintained Outcome: Progressing   

## 2021-10-25 NOTE — Interval H&P Note (Signed)
History and Physical Interval Note: ? ?10/25/2021 ?6:42 AM ? ?Robert Huang [id number]  has presented today for surgery, with the diagnosis of Abscess Left Foot.  The various methods of treatment have been discussed with the patient and family. After consideration of risks, benefits and other options for treatment, the patient has consented to  Procedure(s): ?LEFT FOOT DEDRIDEMENT (Left) as a surgical intervention.  The patient's history has been reviewed, patient examined, no change in status, stable for surgery.  I have reviewed the patient's chart and labs.  Questions were answered to the patient's satisfaction.   ? ? ?Newt Minion ? ? ?

## 2021-10-26 ENCOUNTER — Encounter (HOSPITAL_COMMUNITY): Payer: Self-pay | Admitting: Orthopedic Surgery

## 2021-10-26 DIAGNOSIS — L089 Local infection of the skin and subcutaneous tissue, unspecified: Secondary | ICD-10-CM | POA: Diagnosis not present

## 2021-10-26 DIAGNOSIS — E11628 Type 2 diabetes mellitus with other skin complications: Secondary | ICD-10-CM | POA: Diagnosis not present

## 2021-10-26 LAB — BASIC METABOLIC PANEL
Anion gap: 9 (ref 5–15)
BUN: 16 mg/dL (ref 8–23)
CO2: 22 mmol/L (ref 22–32)
Calcium: 8.4 mg/dL — ABNORMAL LOW (ref 8.9–10.3)
Chloride: 107 mmol/L (ref 98–111)
Creatinine, Ser: 1.16 mg/dL (ref 0.61–1.24)
GFR, Estimated: >60 mL/min (ref >60)
Glucose, Bld: 262 mg/dL — ABNORMAL HIGH (ref 70–99)
Potassium: 4.2 mmol/L (ref 3.5–5.1)
Sodium: 138 mmol/L (ref 135–145)

## 2021-10-26 LAB — CBC WITH DIFFERENTIAL/PLATELET
Abs Immature Granulocytes: 0.12 10*3/uL — ABNORMAL HIGH (ref 0.00–0.07)
Basophils Absolute: 0 10*3/uL (ref 0.0–0.1)
Basophils Relative: 0 %
Eosinophils Absolute: 0 10*3/uL (ref 0.0–0.5)
Eosinophils Relative: 0 %
HCT: 29.2 % — ABNORMAL LOW (ref 39.0–52.0)
Hemoglobin: 9.2 g/dL — ABNORMAL LOW (ref 13.0–17.0)
Immature Granulocytes: 1 %
Lymphocytes Relative: 4 %
Lymphs Abs: 0.5 10*3/uL — ABNORMAL LOW (ref 0.7–4.0)
MCH: 28.6 pg (ref 26.0–34.0)
MCHC: 31.5 g/dL (ref 30.0–36.0)
MCV: 90.7 fL (ref 80.0–100.0)
Monocytes Absolute: 0.8 10*3/uL (ref 0.1–1.0)
Monocytes Relative: 8 %
Neutro Abs: 8.8 10*3/uL — ABNORMAL HIGH (ref 1.7–7.7)
Neutrophils Relative %: 87 %
Platelets: 294 10*3/uL (ref 150–400)
RBC: 3.22 MIL/uL — ABNORMAL LOW (ref 4.22–5.81)
RDW: 15 % (ref 11.5–15.5)
WBC: 10.1 10*3/uL (ref 4.0–10.5)
nRBC: 0 % (ref 0.0–0.2)

## 2021-10-26 LAB — MAGNESIUM: Magnesium: 1.6 mg/dL — ABNORMAL LOW (ref 1.7–2.4)

## 2021-10-26 LAB — GLUCOSE, CAPILLARY
Glucose-Capillary: 179 mg/dL — ABNORMAL HIGH (ref 70–99)
Glucose-Capillary: 199 mg/dL — ABNORMAL HIGH (ref 70–99)
Glucose-Capillary: 182 mg/dL — ABNORMAL HIGH (ref 70–99)
Glucose-Capillary: 206 mg/dL — ABNORMAL HIGH (ref 70–99)

## 2021-10-26 MED ORDER — MAGNESIUM SULFATE 2 GM/50ML IV SOLN
2.0000 g | Freq: Once | INTRAVENOUS | Status: AC
  Administered 2021-10-26: 2 g via INTRAVENOUS
  Filled 2021-10-26: qty 50

## 2021-10-26 NOTE — Progress Notes (Signed)
PROGRESS NOTE    Robert Huang [facility]  SJG:[id number] DOB: [birth year] DOA: 10/22/2021 PCP: [name], MD   Brief Narrative:  Robert Huang is a 61 y.o. male with medical history significant of anemia, hypothyroidism, gout, hypertension, hyperlipidemia, diabetes, anxiety, osteomyelitis status post right great toe amputation and status post left ray amputation presented with left foot pain and eventually was diagnosed with deep multiple abscesses.  He was started on broad-spectrum antibiotics from admission, admitted to hospital service and orthopedics consulted.  He was hemodynamically stable upon presentation  Assessment & Plan:   Principal Problem:   Diabetic foot infection ([place]) Active Problems:   Hypothyroidism   Dyslipidemia   Anxiety state   Essential hypertension   Coronary atherosclerosis   Diabetes mellitus with complication (HCC)   Hypokalemia   History of complete ray amputation of fifth toe of left foot (HCC)   Status post amputation of right great toe (HCC)   Iron deficiency anemia   CKD (chronic kidney disease) stage 2, GFR 60-89 ml/min   Hyponatremia   Abscess of left foot   History of transmetatarsal amputation of left foot (HCC)   Severe protein-calorie malnutrition (HCC)  Sepsis secondary to diabetic left foot infection/deep abscesses with previous history of left ray amputation: Patient did not have sepsis upon presentation.  Met criteria for sepsis after admission based on white cells of 4.5 and fever of 100.7 that developed after admission.  Seen by orthopedics, MRI confirmed deep multiple abscesses in the left foot but no osteomyelitis.  Seen by Dr. Sharol Given, underwent  debridement of large necrotic abscess left transmetatarsal amputation on 10/25/2021.  Per Dr. Jess Barters note, infection has seeded down in the bone and he had suggested transtibial amputation which patient has agreed for and this is planned for Sunday.  ID on board as well for antibiotic  guidance.    Hypomagnesemia: Low again, will replace again.  AKI: Patient had mild AKI which resolved.  Please note that patient does not have CKD as mentioned in previous notes.  Hypokalemia: Resolved.  Hyponatremia: Resolved.  Anemia of chronic disease: Hemoglobin is stable.  Hypothyroidism: Continue Synthroid.  Essential hypertension: Blood pressure controlled.  Continue home amlodipine, clonidine, senna pro-hydrochlorothiazide, metoprolol   CAD/hyperlipidemia: Asymptomatic. - Continue home Zetia, fenofibrate, rosuvastatin  Diabetes melitis type II: Appears to be taking metformin at home which is on hold.  Blood sugar very labile.  Continue 10 units of Semglee as well as SSI.  Anxiety - Continue as needed Xanax      DVT prophylaxis: SCDs Start: 10/25/21 1816 enoxaparin (LOVENOX) injection 40 mg Start: 10/22/21 1830   Code Status: Full Code  Family Communication:  None present at bedside.  Plan of care discussed with patient in length and he/she verbalized understanding and agreed with it.  Status is: Inpatient Remains inpatient appropriate because: Needs surgical procedure.  Estimated body mass index is 27.07 kg/m as calculated from the following:   Height as of this encounter: 6' (1.829 m).   Weight as of this encounter: 90.5 kg.    Nutritional Assessment: Body mass index is 27.07 kg/m.[name] Seen by dietician.  I agree with the assessment and plan as outlined below: Nutrition Status:        . Skin Assessment: I have examined the patient's skin and I agree with the wound assessment as performed by the wound care RN as outlined below:    Consultants:  Orthopedics ID Procedures:  As above  Antimicrobials:  Anti-infectives (From admission, onward)  Start     Dose/Rate Route Frequency Ordered Stop   10/25/21 2200  vancomycin (VANCOREADY) IVPB 2000 mg/400 mL        2,000 mg 200 mL/hr over 120 Minutes Intravenous Every 24 hours 10/25/21 1043      10/23/21 2200  vancomycin (VANCOREADY) IVPB 1750 mg/350 mL  Status:  Discontinued        1,750 mg 175 mL/hr over 120 Minutes Intravenous Every 24 hours 10/22/21 2124 10/25/21 1043   10/22/21 1845  ceFEPIme (MAXIPIME) 2 g in sodium chloride 0.9 % 100 mL IVPB        2 g 200 mL/hr over 30 Minutes Intravenous Every 8 hours 10/22/21 1838     10/22/21 1830  metroNIDAZOLE (FLAGYL) IVPB 500 mg        500 mg 100 mL/hr over 60 Minutes Intravenous Every 12 hours 10/22/21 1823     10/22/21 1815  vancomycin (VANCOREADY) IVPB 2000 mg/400 mL        2,000 mg 200 mL/hr over 120 Minutes Intravenous  Once 10/22/21 1805 10/22/21 2331   10/22/21 1800  piperacillin-tazobactam (ZOSYN) IVPB 3.375 g  Status:  Discontinued        3.375 g 100 mL/hr over 30 Minutes Intravenous  Once 10/22/21 1756 10/22/21 1823         Subjective:  Seen and examined.  No complaints.  He is ready for transtibial amputation as a scheduled on Friday.  Objective: Vitals:   10/25/21 1800 10/25/21 1814 10/25/21 2112 10/26/21 0540  BP: 140/76 (!) 141/75 140/64 140/68  Pulse: (!) 56 (!) 58 64 (!) 59  Resp: _0 Temp: 98.5 F (36.9 C) 98.3 F (36.8 C) 98.7 F (37.1 C) 98 F (36.7 C)  TempSrc:   Oral Oral  SpO2: 96% 100% 98% 100%  Weight:      Height:        Intake/Output Summary (Last 24 hours) at 10/26/2021 0805 Last data filed at 10/26/2021 0541 Gross per 24 hour  Intake 1511.58 ml  Output 225 ml  Net 1286.58 ml    Filed Weights   10/22/21 1606 10/24/21 2012  Weight: 90.7 kg 90.5 kg    Examination:  General exam: Appears calm and comfortable  Respiratory system: Clear to auscultation. Respiratory effort normal. Cardiovascular system: S1 & S2 heard, RRR. No JVD, murmurs, rubs, gallops or clicks. No pedal edema. Gastrointestinal system: Abdomen is nondistended, soft and nontender. No organomegaly or masses felt. Normal bowel sounds heard. Central nervous system: Alert and oriented. No focal neurological  deficits. Extremities: Symmetric 5 x 5 power. Skin: Dressing on left foot. Psychiatry: Judgement and insight appear normal. Mood & affect appropriate.   Data Reviewed: I have personally reviewed following labs and imaging studies  CBC: Recent Labs  Lab 10/22/21 1710 10/23/21 0354 10/24/21 0435 10/26/21 0341  WBC 12.5* 9.9 8.6 10.1  NEUTROABS 10.3*  --  6.2 8.8*  HGB 10.8* 9.0* 10.0* 9.2*  HCT 33.4* 27.9* 31.7* 29.2*  MCV 89.8 88.9 89.3 90.7  PLT 277 223 265 923    Basic Metabolic Panel: Recent Labs  Lab 10/22/21 1710 10/23/21 0354 10/24/21 0435 10/25/21 0912 10/26/21 0341  NA 132* 135 140 140 138  K 3.2* 3.7 5.1 4.6 4.2  CL 101 105 109 110 107  CO2 16* 20* _1 GLUCOSE 172* 220* 151* 179* 262*  BUN 29* 28* _2 CREATININE 1.39* 1.30* 1.03 1.04 1.16  CALCIUM 8.4* 7.8* 8.7* 9.0  8.4*  MG 1.4* 1.4* 1.6* 1.5* 1.6*    GFR: Estimated Creatinine Clearance: 73.4 mL/min (by C-G formula based on SCr of 1.16 mg/dL). Liver Function Tests: Recent Labs  Lab 10/23/21 0354  AST 9*  ALT 10  ALKPHOS 73  BILITOT 0.3  PROT 5.8*  ALBUMIN 2.1*    No results for input(s): LIPASE, AMYLASE in the last 168 hours. No results for input(s): AMMONIA in the last 168 hours. Coagulation Profile: No results for input(s): INR, PROTIME in the last 168 hours. Cardiac Enzymes: No results for input(s): CKTOTAL, CKMB, CKMBINDEX, TROPONINI in the last 168 hours. BNP (last 3 results) No results for input(s): PROBNP in the last 8760 hours. HbA1C: Recent Labs    10/24/21 0435  HGBA1C 7.2*    CBG: Recent Labs  Lab 10/25/21 1620 10/25/21 1642 10/25/21 1710 10/25/21 2117 10/26/21 0719  GLUCAP 63* 57* 123* 297* 179*    Lipid Profile: No results for input(s): CHOL, HDL, LDLCALC, TRIG, CHOLHDL, LDLDIRECT in the last 72 hours. Thyroid Function Tests: No results for input(s): TSH, T4TOTAL, FREET4, T3FREE, THYROIDAB in the last 72 hours. Anemia Panel: No results for  input(s): VITAMINB12, FOLATE, FERRITIN, TIBC, IRON, RETICCTPCT in the last 72 hours. Sepsis Labs: No results for input(s): PROCALCITON, LATICACIDVEN in the last 168 hours.  Recent Results (from the past 240 hour(s))  Culture, blood (routine x 2)     Status: None (Preliminary result)   Collection Time: 10/22/21  4:27 PM   Specimen: BLOOD  Result Value Ref Range Status   Specimen Description BLOOD LEFT ANTECUBITAL  Final   Special Requests   Final    BOTTLES DRAWN AEROBIC AND ANAEROBIC Blood Culture adequate volume   Culture   Final    NO GROWTH 3 DAYS Performed at [facility], [address] [place], [place] [address]    Report Status PENDING  Incomplete  Culture, blood (routine x 2)     Status: None (Preliminary result)   Collection Time: 10/22/21  5:30 PM   Specimen: BLOOD  Result Value Ref Range Status   Specimen Description BLOOD LEFT ANTECUBITAL  Final   Special Requests   Final    BOTTLES DRAWN AEROBIC AND ANAEROBIC Blood Culture results may not be optimal due to an excessive volume of blood received in culture bottles   Culture   Final    NO GROWTH 3 DAYS Performed at [facility], [place] [address], [place], [place] [address]    Report Status PENDING  Incomplete  MRSA Next Gen by PCR, Nasal     Status: None   Collection Time: 10/22/21  9:22 PM   Specimen: Nasal Mucosa; Nasal Swab  Result Value Ref Range Status   MRSA by PCR Next Gen NOT DETECTED NOT DETECTED Final    Comment: (NOTE) The GeneXpert MRSA Assay (FDA approved for NASAL specimens only), is one component of a comprehensive MRSA colonization surveillance program. It is not intended to diagnose MRSA infection nor to guide or monitor treatment for MRSA infections. Test performance is not FDA approved in patients less than 20 years old. Performed at [facility], [place] [address], [place], [place] [address]   Surgical pcr screen     Status: None   Collection Time: 10/24/21 11:08 PM   Specimen:  Nasal Mucosa; Nasal Swab  Result Value Ref Range Status   MRSA, PCR NEGATIVE NEGATIVE Final   Staphylococcus aureus NEGATIVE NEGATIVE Final    Comment: (NOTE) The Xpert  SA Assay (FDA approved for NASAL specimens in patients 62 years of age and older), is one component of a comprehensive surveillance program. It is not intended to diagnose infection nor to guide or monitor treatment. Performed at [facility], [place] [address], [place], [place] [address]   Resp Panel by RT-PCR (Flu A&B, Covid) Nasopharyngeal Swab     Status: None   Collection Time: 10/25/21  2:00 PM   Specimen: Nasopharyngeal Swab; Nasopharyngeal(NP) swabs in vial transport medium  Result Value Ref Range Status   SARS Coronavirus 2 by RT PCR NEGATIVE NEGATIVE Final    Comment: (NOTE) SARS-CoV-2 target nucleic acids are NOT DETECTED.  The SARS-CoV-2 RNA is generally detectable in upper respiratory specimens during the acute phase of infection. The lowest concentration of SARS-CoV-2 viral copies this assay can detect is 138 copies/mL. A negative result does not preclude SARS-Cov-2 infection and should not be used as the sole basis for treatment or other patient management decisions. A negative result may occur with  improper specimen collection/handling, submission of specimen other than nasopharyngeal swab, presence of viral mutation(s) within the areas targeted by this assay, and inadequate number of viral copies(<138 copies/mL). A negative result must be combined with clinical observations, patient history, and epidemiological information. The expected result is Negative.  Fact Sheet for Patients:  EntrepreneurPulse.com.au  Fact Sheet for Healthcare Providers:  IncredibleEmployment.be  This test is no t yet approved or cleared by the [place] FDA and  has been authorized for detection and/or diagnosis of SARS-CoV-2 by FDA under an Emergency Use Authorization (EUA).  This EUA will remain  in effect (meaning this test can be used) for the duration of the COVID-19 declaration under Section 564(b)(1) of the Act, 21 U.S.C.section 360bbb-3(b)(1), unless the authorization is terminated  or revoked sooner.       Influenza A by PCR NEGATIVE NEGATIVE Final   Influenza B by PCR NEGATIVE NEGATIVE Final    Comment: (NOTE) The Xpert Xpress SARS-CoV-2/FLU/RSV plus assay is intended as an aid in the diagnosis of influenza from Nasopharyngeal swab specimens and should not be used as a sole basis for treatment. Nasal washings and aspirates are unacceptable for Xpert Xpress SARS-CoV-2/FLU/RSV testing.  Fact Sheet for Patients: EntrepreneurPulse.com.au  Fact Sheet for Healthcare Providers: IncredibleEmployment.be  This test is not yet approved or cleared by the [place] FDA and has been authorized for detection and/or diagnosis of SARS-CoV-2 by FDA under an Emergency Use Authorization (EUA). This EUA will remain in effect (meaning this test can be used) for the duration of the COVID-19 declaration under Section 564(b)(1) of the Act, 21 U.S.C. section 360bbb-3(b)(1), unless the authorization is terminated or revoked.  Performed at [facility], [place] [address] [place], [place] [address]   Aerobic/Anaerobic Culture w Gram Stain (surgical/deep wound)     Status: None (Preliminary result)   Collection Time: 10/25/21  4:07 PM   Specimen: PATH Soft tissue  Result Value Ref Range Status   Specimen Description WOUND LEFT FOOT  Final   Special Requests NONE  Final   Gram Stain   Final    RARE WBC PRESENT, PREDOMINANTLY MONONUCLEAR RARE GRAM POSITIVE COCCI IN PAIRS Performed at [facility], [address] [place], [place] [address]    Culture PENDING  Incomplete   Report Status PENDING  Incomplete      Radiology Studies: No results found.  Scheduled Meds:  amLODipine  10 mg Oral Daily   cloNIDine  0.3 mg Oral BID   docusate sodium  100 mg Oral BID   enoxaparin (LOVENOX) injection  40 mg Subcutaneous Q24H   ezetimibe  10 mg Oral Daily   fenofibrate  160 mg Oral Daily   insulin aspart  0-15 Units Subcutaneous TID WC   insulin aspart  0-5 Units Subcutaneous QHS   insulin glargine-yfgn  10 Units Subcutaneous Daily   levothyroxine  112 mcg Oral Daily   metoprolol succinate  100 mg Oral BID   pantoprazole  40 mg Oral Daily   rosuvastatin  40 mg Oral Daily   sodium chloride flush  10-40 mL Intracatheter Q12H   sodium chloride flush  3 mL Intravenous Q12H   Continuous Infusions:  sodium chloride 10 mL/hr at 10/26/21 0046   sodium chloride     ceFEPime (MAXIPIME) IV 2 g (10/26/21 0543)   magnesium sulfate bolus IVPB     methocarbamol (ROBAXIN) IV     metronidazole Stopped (10/25/21 2200)   vancomycin Stopped (10/26/21 0030)     LOS: 3 days   Time spent: 25 minutes  Darliss Cheney, MD [facility]  10/26/2021, 8:05 AM  Please page via Shea Evans and do not message via secure chat for urgent patient care matters. Secure chat can be used for non urgent patient care matters.  How to contact the [facility] Attending or Consulting provider [place] or covering provider during after hours [place], for this patient?  Check the care team in [facility] and look for a) attending/consulting TR[facility] provider listed and b) the [facility] team listed. Page or secure chat [phone]. Log into [email or web address] and use 's universal password to access. If you do not have the password, please contact the hospital operator. Locate the [facility] provider you are looking for under [facility] and page to a number that you can be directly reached. If you still have difficulty reaching the provider, please page the [facility] (Director on Call) for the Hospitalists listed on amion for assistance.

## 2021-10-26 NOTE — Progress Notes (Signed)
Inpatient Diabetes Program Recommendations ? ?AACE/ADA: New Consensus Statement on Inpatient Glycemic Control (2015) ? ?Target Ranges:  Prepandial:   less than 140 mg/dL ?     Peak postprandial:   less than 180 mg/dL (1-2 hours) ?     Critically ill patients:  140 - 180 mg/dL  ? ?Lab Results  ?Component Value Date  ? GLUCAP 179 (H) 10/26/2021  ? HGBA1C 7.2 (H) 10/24/2021  ? ? ?Review of Glycemic Control ? Latest Reference Range & Units 10/25/21 16:20 10/25/21 16:42 10/25/21 17:10 10/25/21 21:17 10/26/21 07:19  ?Glucose-Capillary 70 - 99 mg/dL 63 (L) 57 (L) 123 (H) 297 (H) 179 (H)  ?(L): Data is abnormally low ?(H): Data is abnormally high ?Diabetes history: Type 2 DM ?Outpatient Diabetes medications: Metformin 1000 mg BID, Glipizide 5 mg (NT) ?Current orders for Inpatient glycemic control: Semglee 10 units QD, Novolog 0-15 units TID & HS ?Decadron 5 mg x 1 ? ?Inpatient Diabetes Program Recommendations:   ? ?Consider decreasing Novolog correction to 0-6 units TID & HS.  ? ?Thanks, ?Bronson Curb, MSN, RNC-OB ?Diabetes Coordinator ?[phone] (8a-5p) ? ?

## 2021-10-26 NOTE — Progress Notes (Signed)
Patient ID: Robert Huang [id number], male   DOB: [birth year], 61 y.o.   MRN: [id number] ?Patient is postoperative day 1 debridement of large necrotic abscess left transmetatarsal amputation. ? ?Tissue cultures are pending.  Discussed with patient that the abscess and necrotic tissue extended down to bone.  Discussed treatment options with foot salvage intervention versus transtibial amputation.  Patient states he would like to proceed with a transtibial amputation.  We will plan for surgery on Friday. ? ?Patient is part of the [facility] in [place].  Most likely will need rehab in [facility].  Will check to see if patient could utilize inpatient rehab. ?

## 2021-10-26 NOTE — H&P (View-Only) (Signed)
Patient ID: Robert Huang, male   DOB: 05/17/1961, 60 y.o.   MRN: [id number] ?Patient is postoperative day 1 debridement of large necrotic abscess left transmetatarsal amputation. ? ?Tissue cultures are pending.  Discussed with patient that the abscess and necrotic tissue extended down to bone.  Discussed treatment options with foot salvage intervention versus transtibial amputation.  Patient states he would like to proceed with a transtibial amputation.  We will plan for surgery on Friday. ? ?Patient is part of the [facility] in [place].  Most likely will need rehab in [place].  Will check to see if patient could utilize inpatient rehab. ?

## 2021-10-26 NOTE — Evaluation (Signed)
Physical Therapy Evaluation ?Patient Details ?Name: Robert Huang [id number] ?MRN: [id number] ?DOB: 05/05/1961 ?Today's Date: 10/26/2021 ? ?History of Present Illness ? 61 y/o male presented to ED on 10/22/21 for L foot pain x 4 days and ulcers. X-ray showed no evidence of osteomyelitis. MRI showed extensive cellulitis and multifocal abscesses. S/p L foot debridement with wound vac placement on 3/1. Plan for L BKA on 3/3. PMH: HTN, DM, CAD, anxiety, hx of R great toe amputation  ?Clinical Impression ? Patient admitted with above findings and awaiting final sx for L BKA. Patient functioning at supervision level in the room with RW and good ability to maintain NWB on L LE. Initiated education on recovery of L BKA, phantom limb pain/sensation, desensitization techniques, importance of knee ROM, and potential future use of prosthetic, patient verbalized understanding and wrote down all information. Anticipate good rehab potential post surgery. Patient will benefit from skilled PT services during acute stay to address listed deficits. Will follow up post L BKA to determine d/c recommendations.    ?   ? ?Recommendations for follow up therapy are one component of a multi-disciplinary discharge planning process, led by the attending physician.  Recommendations may be updated based on patient status, additional functional criteria and insurance authorization. ? ?Follow Up Recommendations Other (comment) (Will follow up post L BKA to determine d/c recommendation) ? ?  ?Assistance Recommended at Discharge Set up Supervision/Assistance  ?Patient can return home with the following ?   ? ?  ?Equipment Recommendations None recommended by PT  ?Recommendations for Other Services ?    ?  ?Functional Status Assessment Patient has had a recent decline in their functional status and demonstrates the ability to make significant improvements in function in a reasonable and predictable amount of time.  ? ?  ?Precautions / Restrictions  Precautions ?Precautions: Fall ?Precaution Comments: wound vac ?Restrictions ?Weight Bearing Restrictions: Yes ?LLE Weight Bearing: Non weight bearing  ? ?  ? ?Mobility ? Bed Mobility ?Overal bed mobility: Modified Independent ?  ?  ?  ?  ?  ?  ?  ?  ? ?Transfers ?Overall transfer level: Needs assistance ?Equipment used: Rolling Nalia Honeycutt (2 wheels) ?Transfers: Sit to/from Stand ?Sit to Stand: Supervision ?  ?  ?  ?  ?  ?  ?  ? ?Ambulation/Gait ?Ambulation/Gait assistance: Supervision ?Gait Distance (Feet): 20 Feet ?Assistive device: Rolling Myrl Bynum (2 wheels) ?Gait Pattern/deviations: Step-to pattern ?Gait velocity: decreased ?  ?  ?General Gait Details: able to maintain NWB on L LE. Supervision for safety and line management. ? ?Stairs ?  ?  ?  ?  ?  ? ?Wheelchair Mobility ?  ? ?Modified Rankin (Stroke Patients Only) ?  ? ?  ? ?Balance Overall balance assessment: Needs assistance ?Sitting-balance support: No upper extremity supported, Feet supported ?Sitting balance-Leahy Scale: Normal ?  ?  ?Standing balance support: Bilateral upper extremity supported ?Standing balance-Leahy Scale: Poor ?Standing balance comment: using RW for support to maintain NWB ?  ?  ?  ?  ?  ?  ?  ?  ?  ?  ?  ?   ? ? ? ?Pertinent Vitals/Pain Pain Assessment ?Pain Assessment: Faces ?Faces Pain Scale: Hurts little more ?Pain Location: L foot ?Pain Descriptors / Indicators: Grimacing, Guarding ?Pain Intervention(s): Monitored during session, Repositioned  ? ? ?Home Living Family/patient expects to be discharged to:: Private residence ?Living Arrangements: Other relatives ?Available Help at Discharge: Family;Available 24 hours/day ?Type of Home: Apartment ?Home Access: Level entry ?  ?  ?  ?  Home Layout: Two level;Bed/bath upstairs (going to stay on couch on first floor) ?Home Equipment: Tub bench;Rolling Aras Albarran (2 wheels);Crutches;Cane - single point;Wheelchair - manual ?   ?  ?Prior Function Prior Level of Function : Independent/Modified  Independent ?  ?  ?  ?  ?  ?  ?Mobility Comments: using crutches for 1-2 days prior to admission when it hurt ?  ?  ? ? ?Hand Dominance  ?   ? ?  ?Extremity/Trunk Assessment  ? Upper Extremity Assessment ?Upper Extremity Assessment: Overall WFL for tasks assessed ?  ? ?Lower Extremity Assessment ?Lower Extremity Assessment: LLE deficits/detail ?LLE Deficits / Details: grossly 4+/5; wound vac in place ?LLE: Unable to fully assess due to pain ?  ? ?Cervical / Trunk Assessment ?Cervical / Trunk Assessment: Normal  ?Communication  ? Communication: No difficulties  ?Cognition Arousal/Alertness: Awake/alert ?Behavior During Therapy: [facility] for tasks assessed/performed ?Overall Cognitive Status: Within Functional Limits for tasks assessed ?  ?  ?  ?  ?  ?  ?  ?  ?  ?  ?  ?  ?  ?  ?  ?  ?  ?  ?  ? ?  ?General Comments General comments (skin integrity, edema, etc.): Initiated education on L BKA recovery, limb protector, importance of knee ROM, phantom limb sensation/pain, and future potential with prosthetic ? ?  ?Exercises    ? ?Assessment/Plan  ?  ?PT Assessment Patient needs continued PT services  ?PT Problem List Decreased strength;Decreased balance;Decreased mobility;Decreased activity tolerance;Decreased range of motion;Decreased knowledge of use of DME ? ?   ?  ?PT Treatment Interventions DME instruction;Gait training;Stair training;Functional mobility training;Therapeutic activities;Balance training;Therapeutic exercise;Patient/family education   ? ?PT Goals (Current goals can be found in the Care Plan section)  ?Acute Rehab PT Goals ?Patient Stated Goal: to finally be able to be more mobile and heal ?PT Goal Formulation: With patient ?Time For Goal Achievement: 11/09/21 ?Potential to Achieve Goals: Good ? ?  ?Frequency Min 3X/week ?  ? ? ?Co-evaluation   ?  ?  ?  ?  ? ? ?  ?AM-PAC PT "6 Clicks" Mobility  ?Outcome Measure Help needed turning from your back to your side while in a flat bed without using bedrails?:  None ?Help needed moving from lying on your back to sitting on the side of a flat bed without using bedrails?: None ?Help needed moving to and from a bed to a chair (including a wheelchair)?: A Little ?Help needed standing up from a chair using your arms (e.g., wheelchair or bedside chair)?: A Little ?Help needed to walk in hospital room?: A Little ?Help needed climbing 3-5 steps with a railing? : A Little ?6 Click Score: 20 ? ?  ?End of Session   ?Activity Tolerance: Patient tolerated treatment well ?Patient left: in bed;with call bell/phone within reach ?Nurse Communication: Mobility status ?PT Visit Diagnosis: Muscle weakness (generalized) (M62.81);Other abnormalities of gait and mobility (R26.89) ?  ? ?Time: 2993-7169 ?PT Time Calculation (min) (ACUTE ONLY): 26 min ? ? ?Charges:   PT Evaluation ?$PT Eval Moderate Complexity: 1 Mod ?PT Treatments ?$Therapeutic Activity: 8-22 mins ?  ?   ? ? ?Triston Skare A. Dan Humphreys, PT, DPT ?Acute Rehabilitation Services ?Pager [phone] ?Office [phone] ? ? ?Gerber Penza A Maninder Deboer ?10/26/2021, 10:19 AM ? ?

## 2021-10-27 ENCOUNTER — Inpatient Hospital Stay (HOSPITAL_COMMUNITY): Payer: No Typology Code available for payment source | Admitting: Anesthesiology

## 2021-10-27 ENCOUNTER — Encounter (HOSPITAL_COMMUNITY): Payer: Self-pay | Admitting: Internal Medicine

## 2021-10-27 ENCOUNTER — Other Ambulatory Visit: Payer: Self-pay

## 2021-10-27 ENCOUNTER — Encounter (HOSPITAL_COMMUNITY)
Admission: EM | Disposition: A | Discharge status: Home / Self Care | Payer: Self-pay | Source: Home / Self Care | Attending: Family Medicine

## 2021-10-27 DIAGNOSIS — E11628 Type 2 diabetes mellitus with other skin complications: Secondary | ICD-10-CM | POA: Diagnosis not present

## 2021-10-27 DIAGNOSIS — L089 Local infection of the skin and subcutaneous tissue, unspecified: Secondary | ICD-10-CM | POA: Diagnosis not present

## 2021-10-27 DIAGNOSIS — L02612 Cutaneous abscess of left foot: Secondary | ICD-10-CM

## 2021-10-27 HISTORY — PX: AMPUTATION: SHX166

## 2021-10-27 LAB — CULTURE, BLOOD (ROUTINE X 2)
Culture: NO GROWTH
Culture: NO GROWTH
Special Requests: ADEQUATE

## 2021-10-27 LAB — GLUCOSE, CAPILLARY
Glucose-Capillary: 112 mg/dL — ABNORMAL HIGH (ref 70–99)
Glucose-Capillary: 128 mg/dL — ABNORMAL HIGH (ref 70–99)
Glucose-Capillary: 130 mg/dL — ABNORMAL HIGH (ref 70–99)
Glucose-Capillary: 114 mg/dL — ABNORMAL HIGH (ref 70–99)
Glucose-Capillary: 146 mg/dL — ABNORMAL HIGH (ref 70–99)

## 2021-10-27 SURGERY — AMPUTATION BELOW KNEE
Anesthesia: General | Site: Knee | Laterality: Left

## 2021-10-27 MED ORDER — LABETALOL HCL 5 MG/ML IV SOLN: 10.0000 mg | INTRAVENOUS | Status: DC | PRN

## 2021-10-27 MED ORDER — ALUM & MAG HYDROXIDE-SIMETH 200-200-20 MG/5ML PO SUSP: 15.0000 mL | ORAL | Status: DC | PRN

## 2021-10-27 MED ORDER — LACTATED RINGERS IV SOLN: INTRAVENOUS | Status: DC

## 2021-10-27 MED ORDER — HYDRALAZINE HCL 20 MG/ML IJ SOLN: 5.0000 mg | INTRAMUSCULAR | Status: DC | PRN

## 2021-10-27 MED ORDER — GUAIFENESIN-DM 100-10 MG/5ML PO SYRP: 15.0000 mL | ORAL_SOLUTION | ORAL | Status: DC | PRN

## 2021-10-27 MED ORDER — MIDAZOLAM HCL 2 MG/2ML IJ SOLN
2.0000 mg | Freq: Once | INTRAMUSCULAR | Status: AC
Start: 2021-10-27 — End: 2021-10-27

## 2021-10-27 MED ORDER — BUPIVACAINE LIPOSOME 1.3 % IJ SUSP
INTRAMUSCULAR | Status: DC | PRN
  Administered 2021-10-27: 10 mL via PERINEURAL

## 2021-10-27 MED ORDER — INSULIN ASPART 100 UNIT/ML IJ SOLN: 0.0000 [IU] | INTRAMUSCULAR | Status: DC | PRN

## 2021-10-27 MED ORDER — TRANEXAMIC ACID-NACL 1000-0.7 MG/100ML-% IV SOLN
INTRAVENOUS | Status: AC
  Filled 2021-10-27: qty 100

## 2021-10-27 MED ORDER — MAGNESIUM SULFATE 2 GM/50ML IV SOLN: 2.0000 g | Freq: Every day | INTRAVENOUS | Status: DC | PRN

## 2021-10-27 MED ORDER — MAGNESIUM CITRATE PO SOLN: 1.0000 | Freq: Once | ORAL | Status: DC | PRN

## 2021-10-27 MED ORDER — PANTOPRAZOLE SODIUM 40 MG PO TBEC
40.0000 mg | DELAYED_RELEASE_TABLET | Freq: Every day | ORAL | Status: DC
  Administered 2021-10-28: 40 mg via ORAL
  Filled 2021-10-27: qty 1

## 2021-10-27 MED ORDER — TRANEXAMIC ACID-NACL 1000-0.7 MG/100ML-% IV SOLN
1000.0000 mg | INTRAVENOUS | Status: AC
  Administered 2021-10-27: 1000 mg via INTRAVENOUS

## 2021-10-27 MED ORDER — ACETAMINOPHEN 160 MG/5ML PO SOLN: 325.0000 mg | Freq: Once | ORAL | Status: DC | PRN

## 2021-10-27 MED ORDER — ACETAMINOPHEN 10 MG/ML IV SOLN: 1000.0000 mg | Freq: Once | INTRAVENOUS | Status: DC | PRN

## 2021-10-27 MED ORDER — POVIDONE-IODINE 10 % EX SWAB
2.0000 "application " | Freq: Once | CUTANEOUS | Status: AC
  Administered 2021-10-27: 2 via TOPICAL

## 2021-10-27 MED ORDER — MEPERIDINE HCL 25 MG/ML IJ SOLN: 6.2500 mg | INTRAMUSCULAR | Status: DC | PRN

## 2021-10-27 MED ORDER — 0.9 % SODIUM CHLORIDE (POUR BTL) OPTIME
TOPICAL | Status: DC | PRN
  Administered 2021-10-27: 1000 mL

## 2021-10-27 MED ORDER — FENTANYL CITRATE (PF) 250 MCG/5ML IJ SOLN
INTRAMUSCULAR | Status: AC
  Filled 2021-10-27: qty 5

## 2021-10-27 MED ORDER — AMISULPRIDE (ANTIEMETIC) 5 MG/2ML IV SOLN: 10.0000 mg | Freq: Once | INTRAVENOUS | Status: DC | PRN

## 2021-10-27 MED ORDER — FENTANYL CITRATE (PF) 250 MCG/5ML IJ SOLN
INTRAMUSCULAR | Status: DC | PRN
  Administered 2021-10-27: 50 ug via INTRAVENOUS

## 2021-10-27 MED ORDER — BISACODYL 5 MG PO TBEC: 5.0000 mg | DELAYED_RELEASE_TABLET | Freq: Every day | ORAL | Status: DC | PRN

## 2021-10-27 MED ORDER — MIDAZOLAM HCL 2 MG/2ML IJ SOLN
INTRAMUSCULAR | Status: AC
  Administered 2021-10-27: 2 mg via INTRAVENOUS
  Filled 2021-10-27: qty 2

## 2021-10-27 MED ORDER — CHLORHEXIDINE GLUCONATE 4 % EX LIQD: 60.0000 mL | Freq: Once | CUTANEOUS | Status: DC

## 2021-10-27 MED ORDER — METOPROLOL TARTRATE 5 MG/5ML IV SOLN: 2.0000 mg | INTRAVENOUS | Status: DC | PRN

## 2021-10-27 MED ORDER — PHENOL 1.4 % MT LIQD: 1.0000 | OROMUCOSAL | Status: DC | PRN

## 2021-10-27 MED ORDER — LACTATED RINGERS IV SOLN: INTRAVENOUS | Status: DC | PRN

## 2021-10-27 MED ORDER — JUVEN PO PACK
1.0000 | PACK | Freq: Two times a day (BID) | ORAL | Status: DC
  Administered 2021-10-28: 1 via ORAL
  Filled 2021-10-27: qty 1

## 2021-10-27 MED ORDER — FENTANYL CITRATE (PF) 100 MCG/2ML IJ SOLN: 50.0000 ug | Freq: Once | INTRAMUSCULAR | Status: AC

## 2021-10-27 MED ORDER — ZINC SULFATE 220 (50 ZN) MG PO CAPS
220.0000 mg | ORAL_CAPSULE | Freq: Every day | ORAL | Status: DC
  Administered 2021-10-28: 220 mg via ORAL
  Filled 2021-10-27: qty 1

## 2021-10-27 MED ORDER — PROPOFOL 10 MG/ML IV BOLUS
INTRAVENOUS | Status: AC
  Filled 2021-10-27: qty 20

## 2021-10-27 MED ORDER — POLYETHYLENE GLYCOL 3350 17 G PO PACK: 17.0000 g | PACK | Freq: Every day | ORAL | Status: DC | PRN

## 2021-10-27 MED ORDER — DOCUSATE SODIUM 100 MG PO CAPS: 100.0000 mg | ORAL_CAPSULE | Freq: Every day | ORAL | Status: DC

## 2021-10-27 MED ORDER — PHENYLEPHRINE 40 MCG/ML (10ML) SYRINGE FOR IV PUSH (FOR BLOOD PRESSURE SUPPORT)
PREFILLED_SYRINGE | INTRAVENOUS | Status: DC | PRN
  Administered 2021-10-27: 80 ug via INTRAVENOUS

## 2021-10-27 MED ORDER — ACETAMINOPHEN 325 MG PO TABS: 325.0000 mg | ORAL_TABLET | Freq: Once | ORAL | Status: DC | PRN

## 2021-10-27 MED ORDER — CEFAZOLIN SODIUM-DEXTROSE 2-4 GM/100ML-% IV SOLN: 2.0000 g | Freq: Three times a day (TID) | INTRAVENOUS | Status: DC

## 2021-10-27 MED ORDER — ONDANSETRON HCL 4 MG/2ML IJ SOLN: 4.0000 mg | Freq: Four times a day (QID) | INTRAMUSCULAR | Status: DC | PRN

## 2021-10-27 MED ORDER — HYDROMORPHONE HCL 1 MG/ML IJ SOLN: 0.2500 mg | INTRAMUSCULAR | Status: DC | PRN

## 2021-10-27 MED ORDER — SODIUM CHLORIDE 0.9 % IV SOLN: INTRAVENOUS | Status: DC

## 2021-10-27 MED ORDER — POTASSIUM CHLORIDE CRYS ER 20 MEQ PO TBCR: 20.0000 meq | EXTENDED_RELEASE_TABLET | Freq: Every day | ORAL | Status: DC | PRN

## 2021-10-27 MED ORDER — ROPIVACAINE HCL 5 MG/ML IJ SOLN
INTRAMUSCULAR | Status: DC | PRN
  Administered 2021-10-27: 20 mL via PERINEURAL

## 2021-10-27 MED ORDER — ASCORBIC ACID 500 MG PO TABS
1000.0000 mg | ORAL_TABLET | Freq: Every day | ORAL | Status: DC
  Administered 2021-10-28: 1000 mg via ORAL
  Filled 2021-10-27: qty 2

## 2021-10-27 MED ORDER — LIDOCAINE 2% (20 MG/ML) 5 ML SYRINGE
INTRAMUSCULAR | Status: DC | PRN
  Administered 2021-10-27: 60 mg via INTRAVENOUS

## 2021-10-27 MED ORDER — FENTANYL CITRATE (PF) 100 MCG/2ML IJ SOLN
INTRAMUSCULAR | Status: AC
Start: 2021-10-27 — End: 2021-10-27
  Administered 2021-10-27: 50 ug via INTRAVENOUS
  Filled 2021-10-27: qty 2

## 2021-10-27 MED ORDER — CHLORHEXIDINE GLUCONATE 0.12 % MT SOLN
OROMUCOSAL | Status: AC
  Administered 2021-10-27: 15 mL
  Filled 2021-10-27: qty 15

## 2021-10-27 MED ORDER — PROPOFOL 10 MG/ML IV BOLUS
INTRAVENOUS | Status: DC | PRN
  Administered 2021-10-27: 150 mg via INTRAVENOUS

## 2021-10-27 MED ORDER — BUPIVACAINE HCL (PF) 0.5 % IJ SOLN
INTRAMUSCULAR | Status: DC | PRN
  Administered 2021-10-27: 20 mL via PERINEURAL

## 2021-10-27 MED ORDER — ONDANSETRON HCL 4 MG/2ML IJ SOLN
INTRAMUSCULAR | Status: DC | PRN
  Administered 2021-10-27: 4 mg via INTRAVENOUS

## 2021-10-27 MED ORDER — MIDAZOLAM HCL 2 MG/2ML IJ SOLN
INTRAMUSCULAR | Status: AC
  Filled 2021-10-27: qty 2

## 2021-10-27 SURGICAL SUPPLY — 37 items
BAG COUNTER SPONGE SURGICOUNT (BAG) IMPLANT
BLADE SAW RECIP 87.9 MT (BLADE) ×2 IMPLANT
BLADE SURG 21 STRL SS (BLADE) ×2 IMPLANT
BNDG COHESIVE 6X5 TAN STRL LF (GAUZE/BANDAGES/DRESSINGS) ×1 IMPLANT
CANISTER WOUND CARE 500ML ATS (WOUND CARE) ×2 IMPLANT
COVER SURGICAL LIGHT HANDLE (MISCELLANEOUS) ×2 IMPLANT
CUFF TOURN SGL QUICK 34 (TOURNIQUET CUFF) ×2
CUFF TRNQT CYL 34X4.125X (TOURNIQUET CUFF) ×1 IMPLANT
DRAPE DERMATAC (DRAPES) ×2 IMPLANT
DRAPE INCISE IOBAN 66X45 STRL (DRAPES) ×2 IMPLANT
DRAPE U-SHAPE 47X51 STRL (DRAPES) ×2 IMPLANT
DRESSING PREVENA PLUS CUSTOM (GAUZE/BANDAGES/DRESSINGS) ×1 IMPLANT
DRSG PREVENA PLUS CUSTOM (GAUZE/BANDAGES/DRESSINGS)
DURAPREP 26ML APPLICATOR (WOUND CARE) ×2 IMPLANT
ELECT REM PT RETURN 9FT ADLT (ELECTROSURGICAL) ×2
ELECTRODE REM PT RTRN 9FT ADLT (ELECTROSURGICAL) ×1 IMPLANT
GLOVE SURG ORTHO LTX SZ9 (GLOVE) ×2 IMPLANT
GLOVE SURG UNDER POLY LF SZ9 (GLOVE) ×2 IMPLANT
GOWN STRL REUS W/ TWL XL LVL3 (GOWN DISPOSABLE) ×2 IMPLANT
GOWN STRL REUS W/TWL XL LVL3 (GOWN DISPOSABLE) ×4
KIT BASIN OR (CUSTOM PROCEDURE TRAY) ×2 IMPLANT
KIT TURNOVER KIT B (KITS) ×2 IMPLANT
MANIFOLD NEPTUNE II (INSTRUMENTS) ×2 IMPLANT
NS IRRIG 1000ML POUR BTL (IV SOLUTION) ×2 IMPLANT
PACK ORTHO EXTREMITY (CUSTOM PROCEDURE TRAY) ×2 IMPLANT
PAD ARMBOARD 7.5X6 YLW CONV (MISCELLANEOUS) ×2 IMPLANT
PREVENA RESTOR ARTHOFORM 46X30 (CANNISTER) ×3 IMPLANT
SPONGE T-LAP 18X18 ~~LOC~~+RFID (SPONGE) ×1 IMPLANT
STAPLER VISISTAT 35W (STAPLE) IMPLANT
STOCKINETTE IMPERVIOUS LG (DRAPES) ×2 IMPLANT
SUT ETHILON 2 0 PSLX (SUTURE) ×2 IMPLANT
SUT SILK 2 0 (SUTURE)
SUT SILK 2-0 18XBRD TIE 12 (SUTURE) ×1 IMPLANT
SUT VIC AB 1 CTX 27 (SUTURE) ×4 IMPLANT
TOWEL GREEN STERILE (TOWEL DISPOSABLE) ×2 IMPLANT
TUBE CONNECTING 12X1/4 (SUCTIONS) ×2 IMPLANT
YANKAUER SUCT BULB TIP NO VENT (SUCTIONS) ×2 IMPLANT

## 2021-10-27 NOTE — Op Note (Signed)
? ?  Date of Surgery: 10/27/2021 ? ?INDICATIONS: Mr. Gamero is a 61 y.o.-year-old male who initially underwent foot salvage intervention.  Patient had extensive soft tissue necrosis including muscle with abscess down to bone.  Patient did not have sufficient soft tissue or bony anatomy to salvage the foot and patient presents at this time for transtibial amputation on the left.. ? ?PREOPERATIVE DIAGNOSIS: Abscess ulceration osteomyelitis.  Left transmetatarsal amputation ? ?POSTOPERATIVE DIAGNOSIS: Same. ? ?PROCEDURE: Transtibial amputation ?Application of Prevena wound VAC ? ?SURGEON: Lajoyce Corners, M.D. ? ?ANESTHESIA:  general ? ?IV FLUIDS AND URINE: See anesthesia records. ? ?ESTIMATED BLOOD LOSS: See anesthesia records. ? ?COMPLICATIONS: None. ? ?DESCRIPTION OF PROCEDURE: The patient was brought to the operating room after undergoing regional anesthetic. After adequate levels of anesthesia were obtained patient's lower extremity was prepped using DuraPrep draped into a sterile field. A timeout was called. The foot was draped out of the sterile field with impervious stockinette. ?A transverse incision was made 12 cm distal to the tibial tubercle. This curved proximally and a large posterior flap was created. The tibia was transected 1 cm proximal to the skin incision. The fibula was transected just proximal to the tibial incision. The tibia was beveled anteriorly. A large posterior flap was created. The sciatic nerve was pulled cut and allowed to retract. The vascular bundles were suture ligated with 2-0 silk. The deep and superficial fascial layers were closed using #1 Vicryl. The skin was closed using staples and 2-0 nylon. The wound was covered with a Prevena customizable and arthroform wound VAC.  The dressing was sealed with dermatac there was a good suction fit. A prosthetic shrinker and limb protector were applied. Patient was taken to the PACU in stable condition. ? ? ?DISCHARGE PLANNING: ? ?Antibiotic duration:  24 hours ? ?Weightbearing: Nonweightbearing on the operative extremity ? ?Pain medication: Opioid pathway ? ?Dressing care/ Wound VAC: Continue wound VAC for 1 week after discharge ? ?Discharge to: Discharge planning based on therapy's recommendations for possible inpatient rehabilitation, outpatient rehabilitation, or discharge to home with therapy ? ?Follow-up: In the office 1 week post operative. ? ?Aldean Baker, MD ?[facility] ?3:12 PM ? ? ? ? ? ?

## 2021-10-27 NOTE — Interval H&P Note (Signed)
History and Physical Interval Note: ? ?10/27/2021 ?7:05 AM ? ?Robert Huang [id number]  has presented today for surgery, with the diagnosis of Left Foot Abscess.  The various methods of treatment have been discussed with the patient and family. After consideration of risks, benefits and other options for treatment, the patient has consented to  Procedure(s): ?LEFT BELOW KNEE AMPUTATION (Left) as a surgical intervention.  The patient's history has been reviewed, patient examined, no change in status, stable for surgery.  I have reviewed the patient's chart and labs.  Questions were answered to the patient's satisfaction.   ? ? ?[name] ? ? ?

## 2021-10-27 NOTE — Anesthesia Procedure Notes (Signed)
Procedure Name: LMA Insertion ?Date/Time: 10/27/2021 2:21 PM ?Performed by: Kara Mead, CRNA ?Pre-anesthesia Checklist: Patient identified, Emergency Drugs available, Suction available, Patient being monitored and Timeout performed ?Patient Re-evaluated:Patient Re-evaluated prior to induction ?Oxygen Delivery Method: Circle system utilized ?Preoxygenation: Pre-oxygenation with 100% oxygen ?Induction Type: IV induction ?Ventilation: Mask ventilation without difficulty ?LMA: LMA inserted ?LMA Size: 4.0 ?Number of attempts: 1 ?Tube secured with: Tape ?Dental Injury: Teeth and Oropharynx as per pre-operative assessment  ? ? ? ? ?

## 2021-10-27 NOTE — Progress Notes (Signed)
PROGRESS NOTE    [facility]  BUL:[id number] DOB: 25-Oct-1960 DOA: 10/22/2021 PCP: [name], MD   Brief Narrative:  Robert Huang is a 61 y.o. male with medical history significant of anemia, hypothyroidism, gout, hypertension, hyperlipidemia, diabetes, anxiety, osteomyelitis status post right great toe amputation and status post left ray amputation presented with left foot pain and eventually was diagnosed with deep multiple abscesses.  He was started on broad-spectrum antibiotics from admission, admitted to hospital service and orthopedics consulted.  He was hemodynamically stable upon presentation  Assessment & Plan:   Principal Problem:   Diabetic foot infection ([place]) Active Problems:   Hypothyroidism   Dyslipidemia   Anxiety state   Essential hypertension   Coronary atherosclerosis   Diabetes mellitus with complication (HCC)   Hypokalemia   History of complete ray amputation of fifth toe of left foot (HCC)   Status post amputation of right great toe (HCC)   Iron deficiency anemia   CKD (chronic kidney disease) stage 2, GFR 60-89 ml/min   Hyponatremia   Abscess of left foot   History of transmetatarsal amputation of left foot (HCC)   Severe protein-calorie malnutrition (HCC)  Sepsis secondary to diabetic left foot infection/deep abscesses with previous history of left ray amputation: Patient did not have sepsis upon presentation.  Met criteria for sepsis after admission based on white cells of 4.5 and fever of 100.7 that developed after admission.  Seen by orthopedics, MRI confirmed deep multiple abscesses in the left foot but no osteomyelitis.  Seen by Dr. Sharol Given, underwent  debridement of large necrotic abscess left transmetatarsal amputation on 10/25/2021.  Per Dr. Jess Barters note, infection has seeded down in the bone and he had suggested transtibial amputation which patient has agreed for and this is planned for today.  Continue antibiotics and will likely need at  least for next 24 hours after amputation.  Hypomagnesemia: Replaced yesterday.  Will recheck tomorrow.  AKI: Patient had mild AKI which resolved.  Please note that patient does not have CKD as mentioned in previous notes.  Hypokalemia: Resolved.  Hyponatremia: Resolved.  Anemia of chronic disease: Hemoglobin is stable.  Hypothyroidism: Continue Synthroid.  Essential hypertension: Blood pressure controlled.  Continue home amlodipine, clonidine, hydrochlorothiazide, metoprolol   CAD/hyperlipidemia: Asymptomatic. - Continue home Zetia, fenofibrate, rosuvastatin  Diabetes melitis type II: Appears to be taking metformin at home which is on hold.  Blood sugar very labile.  Continue 10 units of Semglee as well as SSI.  Anxiety - Continue as needed Xanax      DVT prophylaxis: SCDs Start: 10/25/21 1816 enoxaparin (LOVENOX) injection 40 mg Start: 10/22/21 1830   Code Status: Full Code  Family Communication:  None present at bedside.  Plan of care discussed with patient in length and he/she verbalized understanding and agreed with it.  Status is: Inpatient Remains inpatient appropriate because: Needs surgical procedure.  Estimated body mass index is 27.07 kg/m as calculated from the following:   Height as of this encounter: 6' (1.829 m).   Weight as of this encounter: 90.5 kg.    Nutritional Assessment: Body mass index is 27.07 kg/m.[name] Seen by dietician.  I agree with the assessment and plan as outlined below: Nutrition Status:        . Skin Assessment: I have examined the patient's skin and I agree with the wound assessment as performed by the wound care RN as outlined below:    Consultants:  Orthopedics ID Procedures:  As above  Antimicrobials:  Anti-infectives (From admission, onward)    Start     Dose/Rate Route Frequency Ordered Stop   10/25/21 2200  vancomycin (VANCOREADY) IVPB 2000 mg/400 mL        2,000 mg 200 mL/hr over 120 Minutes Intravenous Every 24  hours 10/25/21 1043     10/23/21 2200  vancomycin (VANCOREADY) IVPB 1750 mg/350 mL  Status:  Discontinued        1,750 mg 175 mL/hr over 120 Minutes Intravenous Every 24 hours 10/22/21 2124 10/25/21 1043   10/22/21 1845  ceFEPIme (MAXIPIME) 2 g in sodium chloride 0.9 % 100 mL IVPB        2 g 200 mL/hr over 30 Minutes Intravenous Every 8 hours 10/22/21 1838     10/22/21 1830  metroNIDAZOLE (FLAGYL) IVPB 500 mg        500 mg 100 mL/hr over 60 Minutes Intravenous Every 12 hours 10/22/21 1823     10/22/21 1815  vancomycin (VANCOREADY) IVPB 2000 mg/400 mL        2,000 mg 200 mL/hr over 120 Minutes Intravenous  Once 10/22/21 1805 10/22/21 2331   10/22/21 1800  piperacillin-tazobactam (ZOSYN) IVPB 3.375 g  Status:  Discontinued        3.375 g 100 mL/hr over 30 Minutes Intravenous  Once 10/22/21 1756 10/22/21 1823         Subjective:  Seen and examined.  He has no complaints.  Objective: Vitals:   10/26/21 1633 10/26/21 2145 10/27/21 0514 10/27/21 0940  BP: 139/70 (!) 146/66 (!) 154/73 (!) 171/77  Pulse: (!) 59 60 (!) 55 69  Resp: 17 18 15 18   Temp: 98 F (36.7 C) 98.4 F (36.9 C) 98.6 F (37 C) 99.3 F (37.4 C)  TempSrc:  Oral Oral Oral  SpO2: 98% 99% 99% 98%  Weight:      Height:        Intake/Output Summary (Last 24 hours) at 10/27/2021 1207 Last data filed at 10/27/2021 0300 Gross per 24 hour  Intake 2063.18 ml  Output 980 ml  Net 1083.18 ml    Filed Weights   10/22/21 1606 10/24/21 2012  Weight: 90.7 kg 90.5 kg    Examination:  General exam: Appears calm and comfortable  Respiratory system: Clear to auscultation. Respiratory effort normal. Cardiovascular system: S1 & S2 heard, RRR. No JVD, murmurs, rubs, gallops or clicks. No pedal edema. Gastrointestinal system: Abdomen is nondistended, soft and nontender. No organomegaly or masses felt. Normal bowel sounds heard. Central nervous system: Alert and oriented. No focal neurological deficits. Extremities: Dressing  in the left foot. Skin: No rashes, lesions or ulcers.  Psychiatry: Judgement and insight appear normal. Mood & affect appropriate.   Data Reviewed: I have personally reviewed following labs and imaging studies  CBC: Recent Labs  Lab 10/22/21 1710 10/23/21 0354 10/24/21 0435 10/26/21 0341  WBC 12.5* 9.9 8.6 10.1  NEUTROABS 10.3*  --  6.2 8.8*  HGB 10.8* 9.0* 10.0* 9.2*  HCT 33.4* 27.9* 31.7* 29.2*  MCV 89.8 88.9 89.3 90.7  PLT 277 223 265 335    Basic Metabolic Panel: Recent Labs  Lab 10/22/21 1710 10/23/21 0354 10/24/21 0435 10/25/21 0912 10/26/21 0341  NA 132* 135 140 140 138  K 3.2* 3.7 5.1 4.6 4.2  CL 101 105 109 110 107  CO2 16* 20* 22 23 22   GLUCOSE 172* 220* 151* 179* 262*  BUN 29* 28* 18 14 16   CREATININE 1.39* 1.30* 1.03 1.04 1.16  CALCIUM 8.4* 7.8* 8.7* 9.0  8.4*  MG 1.4* 1.4* 1.6* 1.5* 1.6*    GFR: Estimated Creatinine Clearance: 73.4 mL/min (by C-G formula based on SCr of 1.16 mg/dL). Liver Function Tests: Recent Labs  Lab 10/23/21 0354  AST 9*  ALT 10  ALKPHOS 73  BILITOT 0.3  PROT 5.8*  ALBUMIN 2.1*    No results for input(s): LIPASE, AMYLASE in the last 168 hours. No results for input(s): AMMONIA in the last 168 hours. Coagulation Profile: No results for input(s): INR, PROTIME in the last 168 hours. Cardiac Enzymes: No results for input(s): CKTOTAL, CKMB, CKMBINDEX, TROPONINI in the last 168 hours. BNP (last 3 results) No results for input(s): PROBNP in the last 8760 hours. HbA1C: No results for input(s): HGBA1C in the last 72 hours.  CBG: Recent Labs  Lab 10/26/21 1116 10/26/21 1632 10/26/21 2149 10/27/21 0726 10/27/21 1140  GLUCAP 199* 182* 206* 112* 128*    Lipid Profile: No results for input(s): CHOL, HDL, LDLCALC, TRIG, CHOLHDL, LDLDIRECT in the last 72 hours. Thyroid Function Tests: No results for input(s): TSH, T4TOTAL, FREET4, T3FREE, THYROIDAB in the last 72 hours. Anemia Panel: No results for input(s): VITAMINB12,  FOLATE, FERRITIN, TIBC, IRON, RETICCTPCT in the last 72 hours. Sepsis Labs: No results for input(s): PROCALCITON, LATICACIDVEN in the last 168 hours.  Recent Results (from the past 240 hour(s))  Culture, blood (routine x 2)     Status: None   Collection Time: 10/22/21  4:27 PM   Specimen: BLOOD  Result Value Ref Range Status   Specimen Description BLOOD LEFT ANTECUBITAL  Final   Special Requests   Final    BOTTLES DRAWN AEROBIC AND ANAEROBIC Blood Culture adequate volume   Culture   Final    NO GROWTH 5 DAYS Performed at [facility], [address], [place], [place] [address]    Report Status 10/27/2021 FINAL  Final  Culture, blood (routine x 2)     Status: None   Collection Time: 10/22/21  5:30 PM   Specimen: BLOOD  Result Value Ref Range Status   Specimen Description BLOOD LEFT ANTECUBITAL  Final   Special Requests   Final    BOTTLES DRAWN AEROBIC AND ANAEROBIC Blood Culture results may not be optimal due to an excessive volume of blood received in culture bottles   Culture   Final    NO GROWTH 5 DAYS Performed at [facility], [place] [address], [place], [place] [address]    Report Status 10/27/2021 FINAL  Final  MRSA Next Gen by PCR, Nasal     Status: None   Collection Time: 10/22/21  9:22 PM   Specimen: Nasal Mucosa; Nasal Swab  Result Value Ref Range Status   MRSA by PCR Next Gen NOT DETECTED NOT DETECTED Final    Comment: (NOTE) The GeneXpert MRSA Assay (FDA approved for NASAL specimens only), is one component of a comprehensive MRSA colonization surveillance program. It is not intended to diagnose MRSA infection nor to guide or monitor treatment for MRSA infections. Test performance is not FDA approved in patients less than 67 years old. Performed at [facility], [place] [address], [place],  [address]   Surgical pcr screen     Status: None   Collection Time: 10/24/21 11:08 PM   Specimen: Nasal Mucosa; Nasal Swab  Result Value Ref Range Status    MRSA, PCR NEGATIVE NEGATIVE Final   Staphylococcus aureus NEGATIVE NEGATIVE Final    Comment: (NOTE) The Xpert SA Assay (FDA approved  for NASAL specimens in patients 39 years of age and older), is one component of a comprehensive surveillance program. It is not intended to diagnose infection nor to guide or monitor treatment. Performed at [facility], [place] [address], [place], [place] [address]   Resp Panel by RT-PCR (Flu A&B, Covid) Nasopharyngeal Swab     Status: None   Collection Time: 10/25/21  2:00 PM   Specimen: Nasopharyngeal Swab; Nasopharyngeal(NP) swabs in vial transport medium  Result Value Ref Range Status   SARS Coronavirus 2 by RT PCR NEGATIVE NEGATIVE Final    Comment: (NOTE) SARS-CoV-2 target nucleic acids are NOT DETECTED.  The SARS-CoV-2 RNA is generally detectable in upper respiratory specimens during the acute phase of infection. The lowest concentration of SARS-CoV-2 viral copies this assay can detect is 138 copies/mL. A negative result does not preclude SARS-Cov-2 infection and should not be used as the sole basis for treatment or other patient management decisions. A negative result may occur with  improper specimen collection/handling, submission of specimen other than nasopharyngeal swab, presence of viral mutation(s) within the areas targeted by this assay, and inadequate number of viral copies(<138 copies/mL). A negative result must be combined with clinical observations, patient history, and epidemiological information. The expected result is Negative.  Fact Sheet for Patients:  EntrepreneurPulse.com.au  Fact Sheet for Healthcare Providers:  IncredibleEmployment.be  This test is no t yet approved or cleared by the [place] FDA and  has been authorized for detection and/or diagnosis of SARS-CoV-2 by FDA under an Emergency Use Authorization (EUA). This EUA will remain  in effect (meaning this test can  be used) for the duration of the COVID-19 declaration under Section 564(b)(1) of the Act, 21 U.S.C.section 360bbb-3(b)(1), unless the authorization is terminated  or revoked sooner.       Influenza A by PCR NEGATIVE NEGATIVE Final   Influenza B by PCR NEGATIVE NEGATIVE Final    Comment: (NOTE) The Xpert Xpress SARS-CoV-2/FLU/RSV plus assay is intended as an aid in the diagnosis of influenza from Nasopharyngeal swab specimens and should not be used as a sole basis for treatment. Nasal washings and aspirates are unacceptable for Xpert Xpress SARS-CoV-2/FLU/RSV testing.  Fact Sheet for Patients: EntrepreneurPulse.com.au  Fact Sheet for Healthcare Providers: IncredibleEmployment.be  This test is not yet approved or cleared by the [place] FDA and has been authorized for detection and/or diagnosis of SARS-CoV-2 by FDA under an Emergency Use Authorization (EUA). This EUA will remain in effect (meaning this test can be used) for the duration of the COVID-19 declaration under Section 564(b)(1) of the Act, 21 U.S.C. section 360bbb-3(b)(1), unless the authorization is terminated or revoked.  Performed at [facility], [place] [address] [place], [place] [address]   Aerobic/Anaerobic Culture w Gram Stain (surgical/deep wound)     Status: None (Preliminary result)   Collection Time: 10/25/21  4:07 PM   Specimen: PATH Soft tissue  Result Value Ref Range Status   Specimen Description WOUND LEFT FOOT  Final   Special Requests NONE  Final   Gram Stain   Final    RARE WBC PRESENT, PREDOMINANTLY MONONUCLEAR RARE GRAM POSITIVE COCCI IN PAIRS    Culture   Final    FEW PROTEUS MIRABILIS SUSCEPTIBILITIES TO FOLLOW CULTURE REINCUBATED FOR BETTER GROWTH Performed at [facility], [place] [address] [place],  [address]    Report Status PENDING  Incomplete      Radiology Studies: No results found.  Scheduled Meds:  amLODipine  10 mg  Oral Daily   cloNIDine  0.3 mg Oral BID   docusate sodium  100 mg Oral BID   enoxaparin (LOVENOX) injection  40 mg Subcutaneous Q24H   ezetimibe  10 mg Oral Daily   fenofibrate  160 mg Oral Daily   insulin aspart  0-15 Units Subcutaneous TID WC   insulin aspart  0-5 Units Subcutaneous QHS   insulin glargine-yfgn  10 Units Subcutaneous Daily   levothyroxine  112 mcg Oral Daily   metoprolol succinate  100 mg Oral BID   pantoprazole  40 mg Oral Daily   rosuvastatin  40 mg Oral Daily   sodium chloride flush  10-40 mL Intracatheter Q12H   sodium chloride flush  3 mL Intravenous Q12H   Continuous Infusions:  sodium chloride 10 mL/hr at 10/27/21 0110   sodium chloride     ceFEPime (MAXIPIME) IV 2 g (10/27/21 0418)   methocarbamol (ROBAXIN) IV     metronidazole Stopped (10/26/21 2314)   vancomycin Stopped (10/27/21 0110)     LOS: 4 days   Time spent: 26 minutes  Darliss Cheney, MD [facility]  10/27/2021, 12:07 PM  Please page via [facility] and do not message via secure chat for urgent patient care matters. Secure chat can be used for non urgent patient care matters.  How to contact the [facility] Attending or Consulting provider [place] or covering provider during after hours [place], for this patient?  Check the care team in [facility] and look for a) attending/consulting [facility] provider listed and b) the [facility] team listed. Page or secure chat [phone]. Log into [email or web address] and use [place]'s universal password to access. If you do not have the password, please contact the hospital operator. Locate the [facility] provider you are looking for under [facility] and page to a number that you can be directly reached. If you still have difficulty reaching the provider, please page the [facility] (Director on Call) for the Hospitalists listed on amion for assistance.

## 2021-10-27 NOTE — Anesthesia Procedure Notes (Signed)
Anesthesia Regional Block: Popliteal block  ? ?Pre-Anesthetic Checklist: , timeout performed,  Correct Patient, Correct Site, Correct Laterality,  Correct Procedure, Correct Position, site marked,  Risks and benefits discussed,  Surgical consent,  Pre-op evaluation,  At surgeon's request and post-op pain management ? ?Laterality: Left ? ?Prep: chloraprep     ?  ?Needles:  ?Injection technique: Single-shot ? ?Needle Type: Echogenic Stimulator Needle   ? ? ?Needle Length: 9cm  ?Needle Gauge: 21  ? ? ? ?Additional Needles: ? ? ?Procedures:,,,, ultrasound used (permanent image in chart),,    ?Narrative:  ?Start time: 10/27/2021 1:20 PM ?End time: 10/27/2021 1:25 PM ?Injection made incrementally with aspirations every 5 mL. ? ?Performed by: Personally  ?Anesthesiologist: Shelton Silvas, MD ? ?Additional Notes: ?Patient tolerated the procedure well. Local anesthetic introduced in an incremental fashion under minimal resistance after negative aspirations. No paresthesias were elicited. After completion of the procedure, no acute issues were identified and patient continued to be monitored by RN.  ? ? ? ? ? ?

## 2021-10-27 NOTE — Transfer of Care (Signed)
Immediate Anesthesia Transfer of Care Note ? ?Patient: Robert Huang [id number] ? ?Procedure(s) Performed: LEFT BELOW KNEE AMPUTATION (Left: Knee) ? ?Patient Location: PACU ? ?Anesthesia Type:GA combined with regional for post-op pain ? ?Level of Consciousness: awake, alert  and oriented ? ?Airway & Oxygen Therapy: Patient Spontanous Breathing and Patient connected to face mask oxygen ? ?Post-op Assessment: Report given to RN and Post -op Vital signs reviewed and stable ? ?Post vital signs: Reviewed and stable ? ?Last Vitals:  ?Vitals Value Taken Time  ?BP 162/70 10/27/21 1505  ?Temp    ?Pulse 64 10/27/21 1506  ?Resp 14 10/27/21 1506  ?SpO2 100 % 10/27/21 1506  ?Vitals shown include unvalidated device data. ? ?Last Pain:  ?Vitals:  ? 10/27/21 1345  ?TempSrc:   ?PainSc: 0-No pain  ?   ? ?Patients Stated Pain Goal: 3 (10/27/21 1318) ? ?Complications: No notable events documented. ?

## 2021-10-27 NOTE — Anesthesia Procedure Notes (Signed)
Anesthesia Regional Block: Adductor canal block  ? ?Pre-Anesthetic Checklist: , timeout performed,  Correct Patient, Correct Site, Correct Laterality,  Correct Procedure, Correct Position, site marked,  Risks and benefits discussed,  Surgical consent,  Pre-op evaluation,  At surgeon's request and post-op pain management ? ?Laterality: Left ? ?Prep: chloraprep     ?  ?Needles:  ?Injection technique: Single-shot ? ?Needle Type: Echogenic Stimulator Needle   ? ? ?Needle Length: 9cm  ?Needle Gauge: 21  ? ? ? ?Additional Needles: ? ? ?Procedures:,,,, ultrasound used (permanent image in chart),,    ?Narrative:  ?Start time: 10/27/2021 1:25 PM ?End time: 10/27/2021 1:30 PM ?Injection made incrementally with aspirations every 5 mL. ? ?Performed by: Personally  ?Anesthesiologist: Effie Berkshire, MD ? ?Additional Notes: ?Patient tolerated the procedure well. Local anesthetic introduced in an incremental fashion under minimal resistance after negative aspirations. No paresthesias were elicited. After completion of the procedure, no acute issues were identified and patient continued to be monitored by RN.  ? ? ? ? ? ?

## 2021-10-27 NOTE — Anesthesia Preprocedure Evaluation (Addendum)
Anesthesia Evaluation  ?Patient identified by MRN, date of birth, ID band ?Patient awake ? ? ? ?Reviewed: ?Allergy & Precautions, NPO status , Patient's Chart, lab work & pertinent test results, reviewed documented beta blocker date and time  ? ?Airway ?Mallampati: III ? ?TM Distance: >3 FB ?Neck ROM: Full ? ? ? Dental ? ?(+) Teeth Intact, Dental Advisory Given ?  ?Pulmonary ?neg pulmonary ROS,  ?  ?breath sounds clear to auscultation ? ? ? ? ? ? Cardiovascular ?hypertension, Pt. on medications and Pt. on home beta blockers ?+ CAD and + Cardiac Stents  ? ?Rhythm:Regular Rate:Normal ? ? ?  ?Neuro/Psych ?Anxiety negative neurological ROS ?   ? GI/Hepatic ?Neg liver ROS, GERD  Medicated,  ?Endo/Other  ?diabetes, Type 2, Oral Hypoglycemic AgentsHypothyroidism  ? Renal/GU ?Renal disease  ? ?  ?Musculoskeletal ? ? Abdominal ?Normal abdominal exam  (+)   ?Peds ? Hematology ?  ?Anesthesia Other Findings ? ? Reproductive/Obstetrics ? ?  ? ? ? ? ? ? ? ? ? ? ? ? ? ?  ?  ? ? ? ? ? ? ? ?Anesthesia Physical ?Anesthesia Plan ? ?ASA: 3 ? ?Anesthesia Plan: General  ? ?Post-op Pain Management: Regional block*  ? ?Induction: Intravenous ? ?PONV Risk Score and Plan: 3 and Ondansetron, Dexamethasone and Midazolam ? ?Airway Management Planned: LMA ? ?Additional Equipment: None ? ?Intra-op Plan:  ? ?Post-operative Plan: Extubation in OR ? ?Informed Consent: I have reviewed the patients History and Physical, chart, labs and discussed the procedure including the risks, benefits and alternatives for the proposed anesthesia with the patient or authorized representative who has indicated his/her understanding and acceptance.  ? ? ? ?Dental advisory given ? ?Plan Discussed with: CRNA ? ?Anesthesia Plan Comments:   ? ? ? ? ? ?Anesthesia Quick Evaluation ? ?

## 2021-10-28 LAB — GLUCOSE, CAPILLARY
Glucose-Capillary: 150 mg/dL — ABNORMAL HIGH (ref 70–99)
Glucose-Capillary: 197 mg/dL — ABNORMAL HIGH (ref 70–99)

## 2021-10-28 LAB — CBC
HCT: 28.8 % — ABNORMAL LOW (ref 39.0–52.0)
Hemoglobin: 9.2 g/dL — ABNORMAL LOW (ref 13.0–17.0)
MCH: 28.8 pg (ref 26.0–34.0)
MCHC: 31.9 g/dL (ref 30.0–36.0)
MCV: 90 fL (ref 80.0–100.0)
Platelets: 296 10*3/uL (ref 150–400)
RBC: 3.2 MIL/uL — ABNORMAL LOW (ref 4.22–5.81)
RDW: 15.1 % (ref 11.5–15.5)
WBC: 9.6 10*3/uL (ref 4.0–10.5)
nRBC: 0 % (ref 0.0–0.2)

## 2021-10-28 MED ORDER — HYDROCODONE-ACETAMINOPHEN 10-325 MG PO TABS: 1.0000 | ORAL_TABLET | Freq: Four times a day (QID) | ORAL | 0 refills | Status: AC | PRN

## 2021-10-28 NOTE — Progress Notes (Signed)
DISCHARGE NOTE HOME ?Robert Huang [phone] to be discharged Home per MD order. Discussed prescriptions and follow up appointments with the patient. Prescriptions given to patient; medication list explained in detail. Patient verbalized understanding. ? ?Skin clean, dry and intact without evidence of skin break down, no evidence of skin tears noted. IV catheter discontinued intact. Site without signs and symptoms of complications. Dressing and pressure applied. Pt denies pain at the site currently. No complaints noted. ? ?Patient free of lines, drains, and wounds.  ? ?An After Visit Summary (AVS) was printed and given to the patient. ?Patient escorted via wheelchair, and discharged home via private auto. ? ?Gagan Dillion S Sevilla Murtagh, RN   ?

## 2021-10-28 NOTE — Progress Notes (Signed)
Patient ID: Robert Huang [id number], male   DOB: March 30, 1961, 61 y.o.   MRN: [id number] ?Patient is postoperative day 1 left transtibial amputation.  There is no drainage in the wound VAC canister.  Patient may discharge today I plugged in his portable Praveena wound VAC pump.  He will discharge on the portable Praveena wound VAC pump.  I will follow-up in the office in 1 week. ?

## 2021-10-28 NOTE — Evaluation (Signed)
Occupational Therapy Evaluation ?Patient Details ?Name: Robert Huang [id number] ?MRN: [id number] ?DOB: 10/09/1960 ?Today's Date: 10/28/2021 ? ? ?History of Present Illness 61 y/o male presented to ED on 10/22/21 for L foot pain x 4 days and ulcers. X-ray showed no evidence of osteomyelitis. MRI showed extensive cellulitis and multifocal abscesses. S/p L foot debridement with wound vac placement on 3/1. Now s/p L BKA on 3/3. PMH: HTN, DM, CAD, anxiety, hx of R great toe amputation  ? ?Clinical Impression ?  ?Pt admitted for concerns and procedures listed above. PTA pt reported that he was independent with all ADL's and IADL's, including working and driving. At this time, pt requiring supervision to min guard for safety, mainly due to increased pain and mild balance deficits. Pt educated on proper DME usage, residual limb care, and compensatory strategies for ADL's. Anticipate pt will be able to complete ADL's independently as pain lessens and balance improves, recommending no follow up OT at this time. Acute OT will continue to follow while pt is admitted to assist with maximizing his independence.   ?   ? ?Recommendations for follow up therapy are one component of a multi-disciplinary discharge planning process, led by the attending physician.  Recommendations may be updated based on patient status, additional functional criteria and insurance authorization.  ? ?Follow Up Recommendations ? No OT follow up  ?  ?Assistance Recommended at Discharge PRN  ?Patient can return home with the following A little help with walking and/or transfers;Help with stairs or ramp for entrance;A little help with bathing/dressing/bathroom ? ?  ?Functional Status Assessment ? Patient has had a recent decline in their functional status and demonstrates the ability to make significant improvements in function in a reasonable and predictable amount of time.  ?Equipment Recommendations ? None recommended by OT  ?  ?Recommendations for Other Services    ? ? ?  ?Precautions / Restrictions Precautions ?Precautions: Fall ?Precaution Comments: wound vac, L limb protector ?Restrictions ?Weight Bearing Restrictions: Yes ?LLE Weight Bearing: Non weight bearing  ? ?  ? ?Mobility Bed Mobility ?Overal bed mobility: Modified Independent ?  ?  ?  ?  ?  ?  ?  ?  ? ?Transfers ?Overall transfer level: Needs assistance ?Equipment used: Rolling walker (2 wheels) ?Transfers: Sit to/from Stand ?Sit to Stand: Supervision, Min assist ?  ?  ?  ?  ?  ?General transfer comment: supervision from bed surface but light minA for transfer from low couch in room ?  ? ?  ?Balance Overall balance assessment: Needs assistance ?Sitting-balance support: No upper extremity supported, Feet supported ?Sitting balance-Leahy Scale: Normal ?  ?  ?Standing balance support: Bilateral upper extremity supported ?Standing balance-Leahy Scale: Poor ?Standing balance comment: reliant on UE support of RW ?  ?  ?  ?  ?  ?  ?  ?  ?  ?  ?  ?   ? ?ADL either performed or assessed with clinical judgement  ? ?ADL Overall ADL's : Needs assistance/impaired ?  ?  ?  ?  ?  ?  ?  ?  ?  ?  ?  ?  ?  ?  ?  ?  ?  ?  ?  ?General ADL Comments: Pt overall supervision to min guard for all BADL's and functional mobility  ? ? ? ?Vision Baseline Vision/History: 1 Wears glasses ?Ability to See in Adequate Light: 0 Adequate ?Patient Visual Report: No change from baseline ?Vision Assessment?: No apparent visual deficits  ?   ?  Perception Perception ?Perception: Within Functional Limits ?  ?Praxis Praxis ?Praxis: Intact ?  ? ?Pertinent Vitals/Pain Pain Assessment ?Pain Assessment: 0-10 ?Pain Score: 10-Worst pain ever ?Pain Location: L residual limb ?Pain Descriptors / Indicators: Grimacing, Guarding ?Pain Intervention(s): Monitored during session, Repositioned, Premedicated before session  ? ? ? ?Hand Dominance Right ?  ?Extremity/Trunk Assessment Upper Extremity Assessment ?Upper Extremity Assessment: Overall WFL for tasks assessed ?   ?Lower Extremity Assessment ?Lower Extremity Assessment: Defer to PT evaluation ?  ?Cervical / Trunk Assessment ?Cervical / Trunk Assessment: Normal ?  ?Communication Communication ?Communication: No difficulties ?  ?Cognition Arousal/Alertness: Awake/alert ?Behavior During Therapy: [facility] for tasks assessed/performed ?Overall Cognitive Status: Within Functional Limits for tasks assessed ?  ?  ?  ?  ?  ?  ?  ?  ?  ?  ?  ?  ?  ?  ?  ?  ?  ?  ?  ?General Comments  Educated on desensitization with phantom limb pain, importance of knee extension and limb protector at this time. Wound vac on, cannister empty ? ?  ?Exercises   ?  ?Shoulder Instructions    ? ? ?Home Living Family/patient expects to be discharged to:: Private residence ?Living Arrangements: Other relatives ?Available Help at Discharge: Family;Available 24 hours/day ?Type of Home: Apartment ?Home Access: Level entry ?  ?  ?Home Layout: Two level;Bed/bath upstairs ?Alternate Level Stairs-Number of Steps: 13 ?Alternate Level Stairs-Rails: Can reach both ?Bathroom Shower/Tub: Tub/shower unit ?  ?Bathroom Toilet: Standard ?Bathroom Accessibility: Yes ?How Accessible: Accessible via walker ?Home Equipment: Tub bench;Rolling Walker (2 wheels);Crutches;Cane - single point;Wheelchair - manual ?  ?  ?  ? ?  ?Prior Functioning/Environment Prior Level of Function : Independent/Modified Independent ?  ?  ?  ?  ?  ?  ?Mobility Comments: using crutches for 1-2 days prior to admission when it hurt ?ADLs Comments: indep ?  ? ?  ?  ?OT Problem List: Decreased strength;Decreased activity tolerance;Impaired balance (sitting and/or standing);Decreased knowledge of use of DME or AE;Impaired sensation;Pain ?  ?   ?OT Treatment/Interventions: Self-care/ADL training;Therapeutic exercise;Energy conservation;DME and/or AE instruction;Therapeutic activities;Patient/family education  ?  ?OT Goals(Current goals can be found in the care plan section) Acute Rehab OT Goals ?Patient Stated  Goal: To go home ?OT Goal Formulation: With patient ?Time For Goal Achievement: 11/11/21 ?Potential to Achieve Goals: Good  ?OT Frequency: Min 2X/week ?  ? ?Co-evaluation   ?  ?  ?  ?  ? ?  ?AM-PAC OT "6 Clicks" Daily Activity     ?Outcome Measure Help from another person eating meals?: A Little ?Help from another person taking care of personal grooming?: A Little ?Help from another person toileting, which includes using toliet, bedpan, or urinal?: A Little ?Help from another person bathing (including washing, rinsing, drying)?: A Little ?Help from another person to put on and taking off regular upper body clothing?: A Little ?Help from another person to put on and taking off regular lower body clothing?: A Little ?6 Click Score: 18 ?  ?End of Session Equipment Utilized During Treatment: Gait belt;Rolling walker (2 wheels) ?Nurse Communication: Mobility status ? ?Activity Tolerance: Patient tolerated treatment well ?Patient left: in chair;with family/visitor present;with call bell/phone within reach ? ?OT Visit Diagnosis: Unsteadiness on feet (R26.81);Other abnormalities of gait and mobility (R26.89);Muscle weakness (generalized) (M62.81);Pain ?Pain - Right/Left: Left ?Pain - part of body: Leg  ?              ?Time: 2202-5427 ?OT  Time Calculation (min): 34 min ?Charges:  OT General Charges ?$OT Visit: 1 Visit ?OT Evaluation ?$OT Eval Moderate Complexity: 1 Mod ? ?Nabilah Davoli H., OTR/L ?Acute Rehabilitation ? ?Taneisha Fuson Elane Bing Plume ?10/28/2021, 2:03 PM ?

## 2021-10-28 NOTE — TOC Transition Note (Signed)
Transition of Care (TOC) - CM/SW Discharge Note ? ? ?Patient Details  ?Name: Robert Huang PYKDXIPJAS ?MRN: [id number] ?Date of Birth: August 02, 1961 ? ?Transition of Care (TOC) CM/SW Contact:  ?Kallie Locks, RN ?Phone Number: [phone] ?10/28/2021, 12:58 PM ? ? ?Clinical Narrative:  Spoke with [name] daughter Tobi Bastos to discuss TOC needs. Tobi Bastos reports patient only needs rolling walker. Confirmed with therapy that there are no therapy recommendations for home health. Tobi Bastos reports patient's brother will be at home with him. Also states [name] is familiar with the Proveena wound vac. He has had it before. Discussed this NCM has requested rolling walker to be delivered to room prior to hospital discharge.  ? ?No further needs assessed.  ? ? ?Final next level of care: Home/Self Care ?Barriers to Discharge: No Barriers Identified ? ? ?Patient Goals and CMS Choice ?Patient states their goals for this hospitalization and ongoing recovery are:: return home ?CMS Medicare.gov Compare Post Acute Care list provided to:: Patient ?Choice offered to / list presented to : Patient ? ?Discharge Placement ?  ?           ?  ?  ?  ?  ? ?Discharge Plan and Services ?  ?  ?           ?DME Arranged: Walker rolling ?  ?Date DME Agency Contacted: 10/28/21 ?Time DME Agency Contacted: 1213 ?Representative spoke with at DME Agency: Leavy Cella ?  ?  ?  ?  ?  ? ?Social Determinants of Health (SDOH) Interventions ?  ? ? ?Readmission Risk Interventions ?No flowsheet data found. ? ? ? ? ?

## 2021-10-28 NOTE — Progress Notes (Signed)
S/p TMA. Abx x 24hrs post op per note. Stop date added. ? ?Ulyses Southward, PharmD, BCIDP, AAHIVP, CPP ?Infectious Disease Pharmacist ?10/28/2021 10:38 AM ? ? ?

## 2021-10-28 NOTE — Anesthesia Postprocedure Evaluation (Signed)
Anesthesia Post Note ? ?Patient: Robert Huang [id number] ? ?Procedure(s) Performed: LEFT BELOW KNEE AMPUTATION (Left: Knee) ? ?  ? ?Patient location during evaluation: PACU ?Anesthesia Type: General ?Level of consciousness: awake and alert ?Pain management: pain level controlled ?Vital Signs Assessment: post-procedure vital signs reviewed and stable ?Respiratory status: spontaneous breathing, nonlabored ventilation, respiratory function stable and patient connected to nasal cannula oxygen ?Cardiovascular status: blood pressure returned to baseline and stable ?Postop Assessment: no apparent nausea or vomiting ?Anesthetic complications: no ? ? ?No notable events documented. ? ?Last Vitals:  ?Vitals:  ? 10/28/21 0451 10/28/21 0846  ?BP: 130/72 (!) 164/74  ?Pulse: (!) 53 (!) 57  ?Resp: 18 18  ?Temp: 37.3 ?C 37.1 ?C  ?SpO2: 97% 99%  ?  ?Last Pain:  ?Vitals:  ? 10/28/21 1031  ?TempSrc:   ?PainSc: 10-Worst pain ever  ? ? ?  ?  ?  ?  ?  ?  ? ?Laquitha Heslin ? ? ? ? ?

## 2021-10-28 NOTE — Progress Notes (Signed)
Inpatient Rehab Admissions Coordinator:  ? ?PT is recommending no follow up. CIR will sign off.  ? ?Megan Salon, MS, CCC-SLP ?Rehab Admissions Coordinator  ?[phone] (celll) ?[phone] (office) ? ?

## 2021-10-28 NOTE — Progress Notes (Signed)
Physical Therapy Treatment ?Patient Details ?Name: Robert Huang KGMWNUUVOZ ?MRN: [id number] ?DOB: 1961/07/01 ?Today's Date: 10/28/2021 ? ? ?History of Present Illness 61 y/o male presented to ED on 10/22/21 for L foot pain x 4 days and ulcers. X-ray showed no evidence of osteomyelitis. MRI showed extensive cellulitis and multifocal abscesses. S/p L foot debridement with wound vac placement on 3/1. Now s/p L BKA on 3/3. PMH: HTN, DM, CAD, anxiety, hx of R great toe amputation ? ?  ?PT Comments  ? ? Patient seen for re-evaluation s/p L BKA on 3/3. Patient continues to function at supervision level with use of RW as safest means to mobilize. Reviewed stair negotiation, phantom limb sensations/pain, importance of limb protector and knee extension, limb positioning, fall risk, and exercises to strengthen L LE. Patient verbalized and demonstrated understanding. Patient has good rehab potential and anticipate patient will make good progress with prosthetic once healing is complete. No PT follow up recommended at this time, but may benefit from OPPT once cleared by Dr. Lajoyce Corners and wound vac is removed.  ?  ?Recommendations for follow up therapy are one component of a multi-disciplinary discharge planning process, led by the attending physician.  Recommendations may be updated based on patient status, additional functional criteria and insurance authorization. ? ?Follow Up Recommendations ? No PT follow up (may benefit from OPPT once cleared by Dr. Lajoyce Corners and removal of wound vac) ?  ?  ?Assistance Recommended at Discharge Set up Supervision/Assistance  ?Patient can return home with the following   ?  ?Equipment Recommendations ? Rolling Imya Mance (2 wheels)  ?  ?Recommendations for Other Services   ? ? ?  ?Precautions / Restrictions Precautions ?Precautions: Fall ?Precaution Comments: wound vac, L limb protector ?Restrictions ?Weight Bearing Restrictions: Yes ?LLE Weight Bearing: Non weight bearing  ?  ? ?Mobility ? Bed Mobility ?Overal  bed mobility: Modified Independent ?  ?  ?  ?  ?  ?  ?  ?  ? ?Transfers ?Overall transfer level: Needs assistance ?Equipment used: Rolling Slaton Reaser (2 wheels) ?Transfers: Sit to/from Stand ?Sit to Stand: Supervision, Min assist ?  ?  ?  ?  ?  ?General transfer comment: supervision from bed surface but light minA for transfer from low couch in room ?  ? ?Ambulation/Gait ?Ambulation/Gait assistance: Supervision ?Gait Distance (Feet): 10 Feet (+10') ?Assistive device: Rolling Yue Flanigan (2 wheels), Crutches ?Gait Pattern/deviations: Step-to pattern ?Gait velocity: decreased ?  ?  ?General Gait Details: "hop to" gait pattern. Supervision for safety. Initially ambulated to door in room with RW but patient wanted to try with his personal crutches, however unsteady and did not feel comfortable using them for any further distance. Cues for placing crutches and positioning under arms ? ? ?Stairs ?Stairs:  (Discussed stair negotiation backwards on buttocks with chair at top of stairs to avoid having to get up from ground) ?  ?  ?  ?  ? ? ?Wheelchair Mobility ?  ? ?Modified Rankin (Stroke Patients Only) ?  ? ? ?  ?Balance Overall balance assessment: Needs assistance ?Sitting-balance support: No upper extremity supported, Feet supported ?Sitting balance-Leahy Scale: Normal ?  ?  ?Standing balance support: Bilateral upper extremity supported ?Standing balance-Leahy Scale: Poor ?Standing balance comment: reliant on UE support of RW ?  ?  ?  ?  ?  ?  ?  ?  ?  ?  ?  ?  ? ?  ?Cognition Arousal/Alertness: Awake/alert ?Behavior During Therapy: [facility] for tasks assessed/performed ?Overall Cognitive Status:  Within Functional Limits for tasks assessed ?  ?  ?  ?  ?  ?  ?  ?  ?  ?  ?  ?  ?  ?  ?  ?  ?  ?  ?  ? ?  ?Exercises   ? ?  ?General Comments General comments (skin integrity, edema, etc.): Reviewed phantom limb sensation/pain, desensitization techniques, importance of knee extension, fall risk, importance of wearing limb protector, and  exercises to strengthen LLE ?  ?  ? ?Pertinent Vitals/Pain Pain Assessment ?Pain Assessment: 0-10 ?Pain Score: 10-Worst pain ever ?Pain Location: L residual limb ?Pain Descriptors / Indicators: Grimacing, Guarding ?Pain Intervention(s): Monitored during session, Repositioned  ? ? ?Home Living   ?  ?  ?  ?  ?  ?  ?  ?  ?  ?   ?  ?Prior Function    ?  ?  ?   ? ?PT Goals (current goals can now be found in the care plan section) Acute Rehab PT Goals ?Patient Stated Goal: to finally be able to be more mobile and heal ?PT Goal Formulation: With patient ?Time For Goal Achievement: 11/09/21 ?Potential to Achieve Goals: Good ?Progress towards PT goals: Progressing toward goals ? ?  ?Frequency ? ? ? Min 3X/week ? ? ? ?  ?PT Plan Current plan remains appropriate  ? ? ?Co-evaluation   ?  ?  ?  ?  ? ?  ?AM-PAC PT "6 Clicks" Mobility   ?Outcome Measure ? Help needed turning from your back to your side while in a flat bed without using bedrails?: None ?Help needed moving from lying on your back to sitting on the side of a flat bed without using bedrails?: None ?Help needed moving to and from a bed to a chair (including a wheelchair)?: A Little ?Help needed standing up from a chair using your arms (e.g., wheelchair or bedside chair)?: A Little ?Help needed to walk in hospital room?: A Little ?Help needed climbing 3-5 steps with a railing? : A Little ?6 Click Score: 20 ? ?  ?End of Session Equipment Utilized During Treatment: Other (comment) (wound vac, limb protector) ?Activity Tolerance: Patient tolerated treatment well ?Patient left: Other (comment);with family/visitor present (sitting on couch) ?Nurse Communication: Mobility status ?PT Visit Diagnosis: Muscle weakness (generalized) (M62.81);Other abnormalities of gait and mobility (R26.89) ?  ? ? ?Time: 0300-9233 ?PT Time Calculation (min) (ACUTE ONLY): 35 min ? ?Charges:  $Therapeutic Activity: 8-22 mins          ?          ? ?Sherlonda Flater A. Dan Humphreys, PT, DPT ?Acute Rehabilitation  Services ?Pager [phone] ?Office [phone] ? ? ? ?Magda Muise A Chonda Baney ?10/28/2021, 12:55 PM ? ?

## 2021-10-28 NOTE — Discharge Summary (Signed)
PatientPhysician Discharge Summary  Robert [facility] [id number] DOB: [birth year] DOA: 10/22/2021  PCP: [name], MD  Admit date: 10/22/2021 Discharge date: 10/28/2021 30 Day Unplanned Readmission Risk Score    Flowsheet Row ED to Hosp-Admission (Current) from 10/22/2021 in [facility]  30 Day Unplanned Readmission Risk Score (%) 17.23 Filed at 10/28/2021 0800       This score is the patient's risk of an unplanned readmission within 30 days of being discharged (0 -100%). The score is based on dignosis, age, lab data, medications, orders, and past utilization.   Low:  0-14.9   Medium: 15-21.9   High: 22-29.9   Extreme: 30 and above          Admitted From: Home Disposition: Home  Recommendations for Outpatient Follow-up:  Follow up with PCP in 1-2 weeks Please obtain BMP/CBC in one week Follow-up with Dr. Sharol Given in 1 week Please follow up with your PCP on the following pending results: Unresulted Labs (From admission, onward)     Start     Ordered   10/29/21 0500  Creatinine, serum  (enoxaparin (LOVENOX)    CrCl >/= 30 ml/min)  Weekly,   R     Comments: while on enoxaparin therapy    10/22/21 1823   10/28/21 0500  CBC  Daily,   R     Question:  Specimen collection method  Answer:  Lab=Lab collect   10/27/21 1633   10/25/21 0817  SARS Coronavirus 2 by RT PCR (hospital order, performed in [place] hospital lab) Nasopharyngeal Nasopharyngeal Swab  (COVID (Single) Lab - Urgent Cases (Scheduled in the next 24 hours only))  Once,   R       Question Answer Comment  Previously tested for COVID-19 Unknown   Resident in a congregate (group) care setting Unknown   Employed in healthcare setting Unknown   Has patient completed COVID vaccination(s) (2 doses of Pfizer/Moderna 1 dose of [employer]) Unknown      10/25/21 [place]: None Equipment/Devices: Rolling walker  Discharge Condition: Stable CODE STATUS: Full  code Diet recommendation: Cardiac/diabetic  Subjective: Seen and examined along with Dr. Sharol Given today.  No complaints.  He is willing to go home.  He is cleared by Dr. Sharol Given as well.  Brief/Interim Summary: Boyd Magsino is a 61 y.o. male with medical history significant of anemia, hypothyroidism, gout, hypertension, hyperlipidemia, diabetes, anxiety, osteomyelitis status post right great toe amputation and status post left ray amputation presented with left foot pain and eventually was diagnosed with sepsis secondary to deep multiple abscesses.  He was started on broad-spectrum antibiotics from admission, admitted to hospital service and orthopedics consulted.  He was hemodynamically stable upon presentation. underwent  debridement of large necrotic abscess left transmetatarsal amputation on 10/25/2021 followed by left lower extremity transtibial amputation on 10/27/2021.  Doing well today, cleared by Dr. Sharol Given.  Has portable wound VAC.  He will follow-up with him in the clinic in 1 week.  No antibiotics recommended at discharge.   Hypomagnesemia: Replaced.   AKI: Patient had mild AKI which resolved.  Please note that patient does not have CKD as mentioned in previous notes.   Hypokalemia: Resolved.   Hyponatremia: Resolved.  Discharge plan was discussed with patient and/or family member and they verbalized understanding and agreed with it.  Discharge Diagnoses:  Principal Problem:   Diabetic foot infection ([place]) Active  Problems:   Hypothyroidism   Dyslipidemia   Anxiety state   Essential hypertension   Coronary atherosclerosis   Diabetes mellitus with complication (HCC)   Hypokalemia   History of complete ray amputation of fifth toe of left foot (HCC)   Status post amputation of right great toe (HCC)   Iron deficiency anemia   CKD (chronic kidney disease) stage 2, GFR 60-89 ml/min   Hyponatremia   Abscess of left foot   History of transmetatarsal amputation of left foot ([place])    Severe protein-calorie malnutrition [facility])    Discharge Instructions  Discharge Instructions     Negative Pressure Wound Therapy - Incisional   Complete by: As directed       Allergies as of 10/28/2021   No Known Allergies      Medication List     STOP taking these medications    doxycycline 100 MG capsule Commonly known as: VIBRAMYCIN       TAKE these medications    acetaminophen 325 MG tablet Commonly known as: TYLENOL Take 2 tablets (650 mg total) by mouth every 6 (six) hours as needed for mild pain (or Fever >/= 101).   ALPRAZolam 1 MG tablet Commonly known as: XANAX Take 1 tablet (1 mg total) by mouth 3 (three) times daily as needed for anxiety.   amLODipine 10 MG tablet Commonly known as: NORVASC TAKE 1 TABLET (10 MG TOTAL) BY MOUTH DAILY. <PLEASE MAKE APPOINTMENT FOR REFILLS> What changed: additional instructions   Aspirin-Acetaminophen-Caffeine 500-325-65 MG Pack Take 1 Package by mouth daily as needed (pain).   cloNIDine 0.3 MG tablet Commonly known as: CATAPRES TAKE 1 TABLET (0.3 MG TOTAL) BY MOUTH 2 (TWO) TIMES DAILY. NEED OV. What changed: additional instructions   colchicine 0.6 MG tablet TAKE 1 TABLET (0.6 MG TOTAL) BY MOUTH EVERY 6 (SIX) HOURS AS NEEDED (GOUT).   cyanocobalamin 1000 MCG/ML injection Commonly known as: (VITAMIN B-12) Inject 1 mL (1,000 mcg total) into the muscle once a week.   ezetimibe 10 MG tablet Commonly known as: ZETIA TAKE 1 TABLET BY MOUTH EVERY DAY   famotidine 40 MG tablet Commonly known as: Pepcid Take 1 tablet (40 mg total) by mouth daily.   fenofibrate 160 MG tablet TAKE 1 TABLET (160 MG TOTAL) BY MOUTH DAILY. NEED OV. What changed: additional instructions   ferrous sulfate 325 (65 FE) MG EC tablet Take 1 tablet (325 mg total) by mouth 2 (two) times daily.   fluticasone 50 MCG/ACT nasal spray Commonly known as: FLONASE Place 2 sprays into both nostrils daily.   glipiZIDE 5 MG tablet Commonly known  as: GLUCOTROL TAKE 1 TABLET BY MOUTH TWICE A DAY   HYDROcodone-acetaminophen 10-325 MG tablet Commonly known as: NORCO Take 1 tablet by mouth every 6 (six) hours as needed for moderate pain.   indomethacin 75 MG CR capsule Commonly known as: INDOCIN SR Take 1 capsule (75 mg total) by mouth daily as needed for moderate pain.   levothyroxine 112 MCG tablet Commonly known as: SYNTHROID TAKE 1 TABLET BY MOUTH EVERY DAY What changed: when to take this   lisinopril-hydrochlorothiazide 20-12.5 MG tablet Commonly known as: ZESTORETIC TAKE 1 TABLET BY MOUTH 2 (TWO) TIMES DAILY. 10AM AND 5PM   metFORMIN 1000 MG tablet Commonly known as: GLUCOPHAGE TAKE 1 TABLET BY MOUTH TWO TIMES A DAY WITH A MEAL What changed: See the new instructions.   metoprolol succinate 100 MG 24 hr tablet Commonly known as: TOPROL-XL TAKE 1 TABLET BY MOUTH TWICE  A DAY   mupirocin ointment 2 % Commonly known as: BACTROBAN Apply 1 application topically 2 (two) times daily.   nitroGLYCERIN 0.4 MG SL tablet Commonly known as: NITROSTAT Place 1 tablet (0.4 mg total) under the tongue every 5 (five) minutes as needed. For chest pain. What changed:  reasons to take this additional instructions   omeprazole 40 MG capsule Commonly known as: PRILOSEC TAKE 1 CAPSULE (40 MG TOTAL) BY MOUTH DAILY.   OneTouch Ultra test strip Generic drug: glucose blood 1 EACH BY OTHER ROUTE DAILY. USE AS INSTRUCTED   polyethylene glycol 17 g packet Commonly known as: MiraLax Take 17 g by mouth daily as needed. What changed: reasons to take this   rosuvastatin 40 MG tablet Commonly known as: CRESTOR Take 1 tablet (40 mg total) by mouth daily.   SYRINGE 3CC/25GX1" 25G X 1" 3 ML Misc 1 application by Does not apply route once a week.   zolpidem 10 MG tablet Commonly known as: AMBIEN TAKE 1 TABLET BY MOUTH AT BEDTIME AS NEEDED FOR SLEEP What changed:  when to take this additional instructions        Follow-up  Information     Newt Minion, MD Follow up in 1 week(s).   Specialty: Orthopedic Surgery Contact information: [place] [address] [phone]         [name], MD Follow up in 1 week(s).   Specialty: Family Medicine Contact information: [place] [address] [phone]         Lelon Perla, MD .   Specialty: Cardiology Contact information: [address] [place] [address] [phone]                No Known Allergies  Consultations: Orthopedics   Procedures/Studies: MR FOOT LEFT W WO CONTRAST  Result Date: 10/23/2021 CLINICAL DATA:  Nonhealing ulcer.  History of amputations. EXAM: MRI OF THE LEFT FOREFOOT WITHOUT AND WITH CONTRAST TECHNIQUE: Multiplanar, multisequence MR imaging of the left foot was performed both before and after administration of intravenous contrast. CONTRAST:  9.51mL GADAVIST GADOBUTROL 1 MMOL/ML IV SOLN COMPARISON:  MRI 10/22/2020 and radiographs 10/22/2021 FINDINGS: Extensive subcutaneous soft tissue swelling/edema/fluid involving the medial and plantar aspect of the foot consistent with severe cellulitis. There also small multifocal abscesses. The largest abscess is on the plantar and medial aspect of the foot and measures 3 cm. It likely communicates with a small open wound. Two communicating abscesses in the medial soft tissues measure maximal 4.4 cm and slightly more superiorly there is a small abscess measuring 2.5 cm and this contains a small amount of gas. Associated myofasciitis involving the short flexor muscles, most notably the abductor hallucis muscle. I do not see any definite MR findings to suggest septic arthritis or osteomyelitis. Chronic tendinopathy involving the anterior tibialis tendon. IMPRESSION: 1. Extensive cellulitis and multifocal abscesses involving the medial and plantar aspect of the foot. 2. No definite MR findings to suggest septic arthritis or  osteomyelitis. Electronically Signed   By: Marijo Sanes M.D.   On: 10/23/2021 17:55   DG Foot Complete Left  Result Date: 10/22/2021 CLINICAL DATA:  Diabetic foot infection. Right foot pain. Previous amputations. EXAM: LEFT FOOT - COMPLETE 3+ VIEW COMPARISON:  12/08/2020 FINDINGS: Previous amputations of all digits are seen at the level of the proximal metatarsals. No evidence of osteolysis or periostitis to suggest the presence of osteomyelitis. No evidence of fracture or dislocation. No evidence of soft  tissue gas. Prominent plantar calcaneal bone spur noted. IMPRESSION: No radiographic evidence of osteomyelitis, soft tissue gas, or other acute findings. Electronically Signed   By: Marlaine Hind M.D.   On: 10/22/2021 16:53     Discharge Exam: Vitals:   10/28/21 0451 10/28/21 0846  BP: 130/72 (!) 164/74  Pulse: (!) 53 (!) 57  Resp: 18 18  Temp: 99.2 F (37.3 C) 98.7 F (37.1 C)  SpO2: 97% 99%   Vitals:   10/27/21 1612 10/27/21 2018 10/28/21 0451 10/28/21 0846  BP: (!) 172/74 (!) 163/70 130/72 (!) 164/74  Pulse: (!) 59 (!) 50 (!) 53 (!) 57  Resp: 18 18 18 18   Temp: 99.6 F (37.6 C) 98.9 F (37.2 C) 99.2 F (37.3 C) 98.7 F (37.1 C)  TempSrc: Oral Oral Oral Oral  SpO2: 100% 98% 97% 99%  Weight:      Height:        General: Pt is alert, awake, not in acute distress Cardiovascular: RRR, S1/S2 +, no rubs, no gallops Respiratory: CTA bilaterally, no wheezing, no rhonchi Abdominal: Soft, NT, ND, bowel sounds + Extremities: Left transtibial amputation.    The results of significant diagnostics from this hospitalization (including imaging, microbiology, ancillary and laboratory) are listed below for reference.     Microbiology: Recent Results (from the past 240 hour(s))  Culture, blood (routine x 2)     Status: None   Collection Time: 10/22/21  4:27 PM   Specimen: BLOOD  Result Value Ref Range Status   Specimen Description BLOOD LEFT ANTECUBITAL  Final   Special Requests    Final    BOTTLES DRAWN AEROBIC AND ANAEROBIC Blood Culture adequate volume   Culture   Final    NO GROWTH 5 DAYS Performed at [facility], [address], [place], [place] [address]    Report Status 10/27/2021 FINAL  Final  Culture, blood (routine x 2)     Status: None   Collection Time: 10/22/21  5:30 PM   Specimen: BLOOD  Result Value Ref Range Status   Specimen Description BLOOD LEFT ANTECUBITAL  Final   Special Requests   Final    BOTTLES DRAWN AEROBIC AND ANAEROBIC Blood Culture results may not be optimal due to an excessive volume of blood received in culture bottles   Culture   Final    NO GROWTH 5 DAYS Performed at [facility], [place] [address], [place], [place] [address]    Report Status 10/27/2021 FINAL  Final  MRSA Next Gen by PCR, Nasal     Status: None   Collection Time: 10/22/21  9:22 PM   Specimen: Nasal Mucosa; Nasal Swab  Result Value Ref Range Status   MRSA by PCR Next Gen NOT DETECTED NOT DETECTED Final    Comment: (NOTE) The GeneXpert MRSA Assay (FDA approved for NASAL specimens only), is one component of a comprehensive MRSA colonization surveillance program. It is not intended to diagnose MRSA infection nor to guide or monitor treatment for MRSA infections. Test performance is not FDA approved in patients less than 47 years old. Performed at [facility], [place] [address], [place],  [address]   Surgical pcr screen     Status: None   Collection Time: 10/24/21 11:08 PM   Specimen: Nasal Mucosa; Nasal Swab  Result Value Ref Range Status   MRSA, PCR NEGATIVE NEGATIVE Final   Staphylococcus aureus NEGATIVE NEGATIVE Final    Comment: (NOTE) The Xpert SA Assay (FDA approved  for NASAL specimens in patients 20 years of age and older), is one component of a comprehensive surveillance program. It is not intended to diagnose infection nor to guide or monitor treatment. Performed at [facility], [place] [address] [place],  [place] [address]   Resp Panel by RT-PCR (Flu A&B, Covid) Nasopharyngeal Swab     Status: None   Collection Time: 10/25/21  2:00 PM   Specimen: Nasopharyngeal Swab; Nasopharyngeal(NP) swabs in vial transport medium  Result Value Ref Range Status   SARS Coronavirus 2 by RT PCR NEGATIVE NEGATIVE Final    Comment: (NOTE) SARS-CoV-2 target nucleic acids are NOT DETECTED.  The SARS-CoV-2 RNA is generally detectable in upper respiratory specimens during the acute phase of infection. The lowest concentration of SARS-CoV-2 viral copies this assay can detect is 138 copies/mL. A negative result does not preclude SARS-Cov-2 infection and should not be used as the sole basis for treatment or other patient management decisions. A negative result may occur with  improper specimen collection/handling, submission of specimen other than nasopharyngeal swab, presence of viral mutation(s) within the areas targeted by this assay, and inadequate number of viral copies(<138 copies/mL). A negative result must be combined with clinical observations, patient history, and epidemiological information. The expected result is Negative.  Fact Sheet for Patients:  EntrepreneurPulse.com.au  Fact Sheet for Healthcare Providers:  IncredibleEmployment.be  This test is no t yet approved or cleared by the [place] FDA and  has been authorized for detection and/or diagnosis of SARS-CoV-2 by FDA under an Emergency Use Authorization (EUA). This EUA will remain  in effect (meaning this test can be used) for the duration of the COVID-19 declaration under Section 564(b)(1) of the Act, 21 U.S.C.section 360bbb-3(b)(1), unless the authorization is terminated  or revoked sooner.       Influenza A by PCR NEGATIVE NEGATIVE Final   Influenza B by PCR NEGATIVE NEGATIVE Final    Comment: (NOTE) The Xpert Xpress SARS-CoV-2/FLU/RSV plus assay is intended as an aid in the diagnosis of influenza  from Nasopharyngeal swab specimens and should not be used as a sole basis for treatment. Nasal washings and aspirates are unacceptable for Xpert Xpress SARS-CoV-2/FLU/RSV testing.  Fact Sheet for Patients: EntrepreneurPulse.com.au  Fact Sheet for Healthcare Providers: IncredibleEmployment.be  This test is not yet approved or cleared by the [place] FDA and has been authorized for detection and/or diagnosis of SARS-CoV-2 by FDA under an Emergency Use Authorization (EUA). This EUA will remain in effect (meaning this test can be used) for the duration of the COVID-19 declaration under Section 564(b)(1) of the Act, 21 U.S.C. section 360bbb-3(b)(1), unless the authorization is terminated or revoked.  Performed at [facility], [place] [address], [place], [place] [address]   Aerobic/Anaerobic Culture w Gram Stain (surgical/deep wound)     Status: None (Preliminary result)   Collection Time: 10/25/21  4:07 PM   Specimen: PATH Soft tissue  Result Value Ref Range Status   Specimen Description WOUND LEFT FOOT  Final   Special Requests NONE  Final   Gram Stain   Final    RARE WBC PRESENT, PREDOMINANTLY MONONUCLEAR RARE GRAM POSITIVE COCCI IN PAIRS    Culture   Final    FEW PROTEUS MIRABILIS CULTURE REINCUBATED FOR BETTER GROWTH HOLDING FOR POSSIBLE ANAEROBE Performed at [facility], [place] [address] [place], [place] [address]    Report Status PENDING  Incomplete   Organism ID, Bacteria PROTEUS MIRABILIS  Final  Susceptibility   Proteus mirabilis - MIC*    AMPICILLIN <=2 SENSITIVE Sensitive     CEFAZOLIN <=4 SENSITIVE Sensitive     CEFEPIME <=0.12 SENSITIVE Sensitive     CEFTAZIDIME <=1 SENSITIVE Sensitive     CEFTRIAXONE <=0.25 SENSITIVE Sensitive     CIPROFLOXACIN <=0.25 SENSITIVE Sensitive     GENTAMICIN <=1 SENSITIVE Sensitive     IMIPENEM 2 SENSITIVE Sensitive     TRIMETH/SULFA <=20 SENSITIVE Sensitive      AMPICILLIN/SULBACTAM <=2 SENSITIVE Sensitive     PIP/TAZO <=4 SENSITIVE Sensitive     * FEW PROTEUS MIRABILIS     Labs: BNP (last 3 results) No results for input(s): BNP in the last 8760 hours. Basic Metabolic Panel: Recent Labs  Lab 10/22/21 1710 10/23/21 0354 10/24/21 0435 10/25/21 0912 10/26/21 0341  NA 132* 135 140 140 138  K 3.2* 3.7 5.1 4.6 4.2  CL 101 105 109 110 107  CO2 16* 20* 22 23 22   GLUCOSE 172* 220* 151* 179* 262*  BUN 29* 28* 18 14 16   CREATININE 1.39* 1.30* 1.03 1.04 1.16  CALCIUM 8.4* 7.8* 8.7* 9.0 8.4*  MG 1.4* 1.4* 1.6* 1.5* 1.6*   Liver Function Tests: Recent Labs  Lab 10/23/21 0354  AST 9*  ALT 10  ALKPHOS 73  BILITOT 0.3  PROT 5.8*  ALBUMIN 2.1*   No results for input(s): LIPASE, AMYLASE in the last 168 hours. No results for input(s): AMMONIA in the last 168 hours. CBC: Recent Labs  Lab 10/22/21 1710 10/23/21 0354 10/24/21 0435 10/26/21 0341 10/28/21 0135  WBC 12.5* 9.9 8.6 10.1 9.6  NEUTROABS 10.3*  --  6.2 8.8*  --   HGB 10.8* 9.0* 10.0* 9.2* 9.2*  HCT 33.4* 27.9* 31.7* 29.2* 28.8*  MCV 89.8 88.9 89.3 90.7 90.0  PLT 277 223 265 294 296   Cardiac Enzymes: No results for input(s): CKTOTAL, CKMB, CKMBINDEX, TROPONINI in the last 168 hours. BNP: Invalid input(s): POCBNP CBG: Recent Labs  Lab 10/27/21 1309 10/27/21 1507 10/27/21 2021 10/28/21 0723 10/28/21 1150  GLUCAP 130* 114* 146* 150* 197*   D-Dimer No results for input(s): DDIMER in the last 72 hours. Hgb A1c No results for input(s): HGBA1C in the last 72 hours. Lipid Profile No results for input(s): CHOL, HDL, LDLCALC, TRIG, CHOLHDL, LDLDIRECT in the last 72 hours. Thyroid function studies No results for input(s): TSH, T4TOTAL, T3FREE, THYROIDAB in the last 72 hours.  Invalid input(s): FREET3 Anemia work up No results for input(s): VITAMINB12, FOLATE, FERRITIN, TIBC, IRON, RETICCTPCT in the last 72 hours. Urinalysis    Component Value Date/Time   COLORURINE  COLORLESS (A) 12/08/2020 2350   APPEARANCEUR CLEAR 12/08/2020 2350   LABSPEC 1.009 12/08/2020 2350   PHURINE 5.0 12/08/2020 2350   GLUCOSEU NEGATIVE 12/08/2020 2350   HGBUR NEGATIVE 12/08/2020 2350   HGBUR negative 04/16/2007 0904   BILIRUBINUR NEGATIVE 12/08/2020 2350   BILIRUBINUR neg 11/13/2019 1527   KETONESUR NEGATIVE 12/08/2020 2350   PROTEINUR NEGATIVE 12/08/2020 2350   UROBILINOGEN 0.2 11/13/2019 1527   UROBILINOGEN negative 04/16/2007 0904   NITRITE NEGATIVE 12/08/2020 2350   LEUKOCYTESUR NEGATIVE 12/08/2020 2350   Sepsis Labs Invalid input(s): PROCALCITONIN,  WBC,  LACTICIDVEN Microbiology Recent Results (from the past 240 hour(s))  Culture, blood (routine x 2)     Status: None   Collection Time: 10/22/21  4:27 PM   Specimen: BLOOD  Result Value Ref Range Status   Specimen Description BLOOD LEFT ANTECUBITAL  Final   Special Requests  Final    BOTTLES DRAWN AEROBIC AND ANAEROBIC Blood Culture adequate volume   Culture   Final    NO GROWTH 5 DAYS Performed at [facility], [place] [address] [place], [place] [address]    Report Status 10/27/2021 FINAL  Final  Culture, blood (routine x 2)     Status: None   Collection Time: 10/22/21  5:30 PM   Specimen: BLOOD  Result Value Ref Range Status   Specimen Description BLOOD LEFT ANTECUBITAL  Final   Special Requests   Final    BOTTLES DRAWN AEROBIC AND ANAEROBIC Blood Culture results may not be optimal due to an excessive volume of blood received in culture bottles   Culture   Final    NO GROWTH 5 DAYS Performed at [facility], [place] [address], [place], [place] [address]    Report Status 10/27/2021 FINAL  Final  MRSA Next Gen by PCR, Nasal     Status: None   Collection Time: 10/22/21  9:22 PM   Specimen: Nasal Mucosa; Nasal Swab  Result Value Ref Range Status   MRSA by PCR Next Gen NOT DETECTED NOT DETECTED Final    Comment: (NOTE) The GeneXpert MRSA Assay (FDA approved for NASAL specimens only), is one  component of a comprehensive MRSA colonization surveillance program. It is not intended to diagnose MRSA infection nor to guide or monitor treatment for MRSA infections. Test performance is not FDA approved in patients less than 17 years old. Performed at [facility], [place] [address], [place], [place] [address]   Surgical pcr screen     Status: None   Collection Time: 10/24/21 11:08 PM   Specimen: Nasal Mucosa; Nasal Swab  Result Value Ref Range Status   MRSA, PCR NEGATIVE NEGATIVE Final   Staphylococcus aureus NEGATIVE NEGATIVE Final    Comment: (NOTE) The Xpert SA Assay (FDA approved for NASAL specimens in patients 63 years of age and older), is one component of a comprehensive surveillance program. It is not intended to diagnose infection nor to guide or monitor treatment. Performed at [facility], [place] [address], [place], [place] [address]   Resp Panel by RT-PCR (Flu A&B, Covid) Nasopharyngeal Swab     Status: None   Collection Time: 10/25/21  2:00 PM   Specimen: Nasopharyngeal Swab; Nasopharyngeal(NP) swabs in vial transport medium  Result Value Ref Range Status   SARS Coronavirus 2 by RT PCR NEGATIVE NEGATIVE Final    Comment: (NOTE) SARS-CoV-2 target nucleic acids are NOT DETECTED.  The SARS-CoV-2 RNA is generally detectable in upper respiratory specimens during the acute phase of infection. The lowest concentration of SARS-CoV-2 viral copies this assay can detect is 138 copies/mL. A negative result does not preclude SARS-Cov-2 infection and should not be used as the sole basis for treatment or other patient management decisions. A negative result may occur with  improper specimen collection/handling, submission of specimen other than nasopharyngeal swab, presence of viral mutation(s) within the areas targeted by this assay, and inadequate number of viral copies(<138 copies/mL). A negative result must be combined with clinical observations, patient history,  and epidemiological information. The expected result is Negative.  Fact Sheet for Patients:  EntrepreneurPulse.com.au  Fact Sheet for Healthcare Providers:  IncredibleEmployment.be  This test is no t yet approved or cleared by the [place] FDA and  has been authorized for detection and/or diagnosis of SARS-CoV-2 by FDA under an Emergency Use Authorization (EUA). This EUA will remain  in effect (meaning this test can be used) for the duration of the COVID-19 declaration under Section 564(b)(1) of the Act, 21 U.S.C.section 360bbb-3(b)(1), unless the authorization is terminated  or revoked sooner.       Influenza A by PCR NEGATIVE NEGATIVE Final   Influenza B by PCR NEGATIVE NEGATIVE Final    Comment: (NOTE) The Xpert Xpress SARS-CoV-2/FLU/RSV plus assay is intended as an aid in the diagnosis of influenza from Nasopharyngeal swab specimens and should not be used as a sole basis for treatment. Nasal washings and aspirates are unacceptable for Xpert Xpress SARS-CoV-2/FLU/RSV testing.  Fact Sheet for Patients: EntrepreneurPulse.com.au  Fact Sheet for Healthcare Providers: IncredibleEmployment.be  This test is not yet approved or cleared by the [place] FDA and has been authorized for detection and/or diagnosis of SARS-CoV-2 by FDA under an Emergency Use Authorization (EUA). This EUA will remain in effect (meaning this test can be used) for the duration of the COVID-19 declaration under Section 564(b)(1) of the Act, 21 U.S.C. section 360bbb-3(b)(1), unless the authorization is terminated or revoked.  Performed at [facility], [place] [address], [place], [place] [address]   Aerobic/Anaerobic Culture w Gram Stain (surgical/deep wound)     Status: None (Preliminary result)   Collection Time: 10/25/21  4:07 PM   Specimen: PATH Soft tissue  Result Value Ref Range Status   Specimen Description  WOUND LEFT FOOT  Final   Special Requests NONE  Final   Gram Stain   Final    RARE WBC PRESENT, PREDOMINANTLY MONONUCLEAR RARE GRAM POSITIVE COCCI IN PAIRS    Culture   Final    FEW PROTEUS MIRABILIS CULTURE REINCUBATED FOR BETTER GROWTH HOLDING FOR POSSIBLE ANAEROBE Performed at [facility], [place] [address], [place], [place] [address]    Report Status PENDING  Incomplete   Organism ID, Bacteria PROTEUS MIRABILIS  Final      Susceptibility   Proteus mirabilis - MIC*    AMPICILLIN <=2 SENSITIVE Sensitive     CEFAZOLIN <=4 SENSITIVE Sensitive     CEFEPIME <=0.12 SENSITIVE Sensitive     CEFTAZIDIME <=1 SENSITIVE Sensitive     CEFTRIAXONE <=0.25 SENSITIVE Sensitive     CIPROFLOXACIN <=0.25 SENSITIVE Sensitive     GENTAMICIN <=1 SENSITIVE Sensitive     IMIPENEM 2 SENSITIVE Sensitive     TRIMETH/SULFA <=20 SENSITIVE Sensitive     AMPICILLIN/SULBACTAM <=2 SENSITIVE Sensitive     PIP/TAZO <=4 SENSITIVE Sensitive     * FEW PROTEUS MIRABILIS     Time coordinating discharge: Over 30 minutes  SIGNED:   Darliss Cheney, MD  [facility] 10/28/2021, 11:54 AM  If 7PM-7AM, please contact night-coverage [email or web address]

## 2021-10-29 ENCOUNTER — Encounter (HOSPITAL_COMMUNITY): Payer: Self-pay | Admitting: Orthopedic Surgery

## 2021-10-30 ENCOUNTER — Telehealth: Payer: Self-pay

## 2021-10-30 ENCOUNTER — Telehealth: Payer: Self-pay | Admitting: Orthopedic Surgery

## 2021-10-30 LAB — SURGICAL PATHOLOGY

## 2021-10-30 NOTE — Telephone Encounter (Signed)
Pt has an appt on 11/02/2021 for first post op. Just had BKA on 10/27/2021. Will hold and discuss rx at visit.  ?

## 2021-10-30 NOTE — Telephone Encounter (Addendum)
Transition Care Management Unsuccessful Follow-up Telephone Call ? ?Date of discharge and from where:  [place] 10-28-21 Dx: diabetic foot infection ? ?Attempts:  1st Attempt ? ?Reason for unsuccessful TCM follow-up call:  Left voice message ? ?Transition Care Management Unsuccessful Follow-up Telephone Call ? ?Date of discharge and from where:  [place] 10-28-21 Dx: diabetic foot infection ? ?Attempts:  2nd Attempt ? ?Reason for unsuccessful TCM follow-up call:  Left voice message ? ?Transition Care Management Unsuccessful Follow-up Telephone Call ? ?Date of discharge and from where:  [place] 10-28-21 Dx: diabetic foot infection ? ?Attempts:  3rd Attempt ? ?Reason for unsuccessful TCM follow-up call:  Left voice message ? ?  ? ?  ? ?  ?

## 2021-10-30 NOTE — Telephone Encounter (Signed)
Pt calling to get information on how to send his notes, past rx, and demographics to another provider ([facility] [facility], fax [phone]). Pt has an appt sch'd for this Thursday for a follow up with Dr. Sharol Given but states he lives 2 and some hours away. The best call back number for the pt is [phone].  ?

## 2021-10-30 NOTE — Telephone Encounter (Incomplete Revision)
Transition Care Management Unsuccessful Follow-up Telephone Call  Date of discharge and from where:  [place] 10-28-21 Dx: diabetic foot infection  Attempts:  1st Attempt  Reason for unsuccessful TCM follow-up call:  Left voice message  Transition Care Management Unsuccessful Follow-up Telephone Call  Date of discharge and from where:  [place] 10-28-21 Dx: diabetic foot infection  Attempts:  2nd Attempt  Reason for unsuccessful TCM follow-up call:  Left voice message

## 2021-10-30 NOTE — Telephone Encounter (Signed)
IC, spoke with patient. He is requesting an order for his prosthesis to be faxed to [employer] [employer]. He states he realizes it's a little early but, he is being proactive.  They will also need records&demographics. [employer] surgeon fax [phone] ?

## 2021-11-02 ENCOUNTER — Ambulatory Visit (INDEPENDENT_AMBULATORY_CARE_PROVIDER_SITE_OTHER): Payer: No Typology Code available for payment source | Admitting: Orthopedic Surgery

## 2021-11-02 ENCOUNTER — Other Ambulatory Visit: Payer: Self-pay

## 2021-11-02 DIAGNOSIS — S88112A Complete traumatic amputation at level between knee and ankle, left lower leg, initial encounter: Secondary | ICD-10-CM

## 2021-11-02 DIAGNOSIS — Z89512 Acquired absence of left leg below knee: Secondary | ICD-10-CM

## 2021-11-02 LAB — AEROBIC/ANAEROBIC CULTURE W GRAM STAIN (SURGICAL/DEEP WOUND)

## 2021-11-02 MED ORDER — OXYCODONE-ACETAMINOPHEN 5-325 MG PO TABS: 1.0000 | ORAL_TABLET | ORAL | 0 refills | Status: DC | PRN

## 2021-11-02 NOTE — Telephone Encounter (Signed)
Pt has appt today

## 2021-11-03 ENCOUNTER — Telehealth: Payer: Self-pay

## 2021-11-03 NOTE — Telephone Encounter (Signed)
Pt FMLA paperwork was faxed to Dr Lajoyce Corners pt orthopedics per Dr Clent Ridges ?

## 2021-11-05 ENCOUNTER — Encounter: Payer: Self-pay | Admitting: Orthopedic Surgery

## 2021-11-05 NOTE — Progress Notes (Signed)
? ?Office Visit Note ?  ?Patient: Robert Huang [id number]           ?Date of Birth: [birth year]           ?MRN: [id number] ?Visit Date: 11/02/2021 ?             ?Requested by: [name], MD ?[place] ?[place],  [place] [address] ?PCP: [name], MD ? ?Chief Complaint  ?Patient presents with  ? Left Leg - Routine Post Op  ?  10/27/21 left BKA ?Wound vac taken off, no drainage in canister ?Wearing limb protector and shrinker ?Photos taken  ? ? ? ? ?HPI: ?Patient is a 61 year old gentleman who presents 1 week status post left transtibial amputation. ? ?Assessment & Plan: ?Visit Diagnoses:  ?1. Below-knee amputation of left lower extremity ([place])   ? ? ?Plan: Continue with the compression stocking.  Patient was given a prescription for a prosthesis in [place].  He was also given a prescription for physical therapy in [place] as well as a prescription for Percocet.  Out of work until further notice.  Reviewed the importance of working on knee extension. ? ?Follow-Up Instructions: Return in about 2 weeks (around 11/16/2021).  ? ?Ortho Exam ? ?Patient is alert, oriented, no adenopathy, well-dressed, normal affect, normal respiratory effort. ?Examination the incision is healing well there is no cellulitis the wound edges are well approximated patient lacks about 10 degrees to full extension. ? ?Patient is a new left transtibial  amputee. ? ?Patient's current comorbidities are not expected to impact the ability to function with the prescribed prosthesis. ?Patient verbally communicates a strong desire to use a prosthesis. ?Patient currently requires mobility aids to ambulate without a prosthesis.  Expects not to use mobility aids with a new prosthesis. ? ?Patient is a K3 level ambulator that spends a lot of time walking around on uneven terrain over obstacles, up and down stairs, and ambulates with a variable cadence. ? ?  ? ?Imaging: ?No results found. ? ? ? ?Labs: ?Lab Results  ?Component Value Date  ? HGBA1C  7.2 (H) 10/24/2021  ? HGBA1C 6.6 (H) 12/08/2020  ? HGBA1C 7.5 (H) 09/28/2020  ? ESRSEDRATE >140 (H) 10/22/2021  ? ESRSEDRATE 50 (H) 03/02/2019  ? ESRSEDRATE 63 (H) 07/17/2012  ? CRP 20.1 (H) 10/22/2021  ? CRP 2.9 (H) 03/02/2019  ? CRP 1.3 (H) 07/17/2012  ? LABURIC 7.2 12/03/2017  ? LABURIC 7.6 01/11/2017  ? LABURIC 12.7 (H) 11/05/2014  ? REPTSTATUS 11/02/2021 FINAL 10/25/2021  ? GRAMSTAIN  10/25/2021  ?  RARE WBC PRESENT, PREDOMINANTLY MONONUCLEAR ?RARE GRAM POSITIVE COCCI IN PAIRS ?  ? CULT  10/25/2021  ?  FEW PROTEUS MIRABILIS ?RARE ENTEROCOCCUS FAECALIS ?RARE BACTEROIDES FRAGILIS ?BETA LACTAMASE POSITIVE ?Performed at [facility], [place] [address], [place], [place] [address] ?  ? [place] 10/25/2021  ? LABORGA ENTEROCOCCUS FAECALIS 10/25/2021  ? ? ? ?Lab Results  ?Component Value Date  ? ALBUMIN 2.1 (L) 10/23/2021  ? ALBUMIN 3.0 (L) 12/08/2020  ? ALBUMIN 3.9 09/28/2020  ? ? ?Lab Results  ?Component Value Date  ? MG 1.6 (L) 10/26/2021  ? MG 1.5 (L) 10/25/2021  ? MG 1.6 (L) 10/24/2021  ? ?No results found for: VD25OH ? ?No results found for: PREALBUMIN ?CBC EXTENDED Latest Ref Rng & Units 10/28/2021 10/26/2021 10/24/2021  ?WBC 4.0 - 10.5 K/uL 9.6 10.1 8.6  ?RBC 4.22 - 5.81 MIL/uL 3.20(L) 3.22(L) 3.55(L)  ?HGB 13.0 - 17.0 g/dL 9.2(L) 9.2(L)  10.0(L)  ?HCT 39.0 - 52.0 % 28.8(L) 29.2(L) 31.7(L)  ?PLT 150 - 400 K/uL 296 294 265  ?NEUTROABS 1.7 - 7.7 K/uL - 8.8(H) 6.2  ?LYMPHSABS 0.7 - 4.0 K/uL - 0.5(L) 1.0  ? ? ? ?There is no height or weight on file to calculate BMI. ? ?Orders:  ?No orders of the defined types were placed in this encounter. ? ?Meds ordered this encounter  ?Medications  ? oxyCODONE-acetaminophen (PERCOCET/ROXICET) 5-325 MG tablet  ?  Sig: Take 1 tablet by mouth every 4 (four) hours as needed for severe pain.  ?  Dispense:  30 tablet  ?  Refill:  0  ? ? ? Procedures: ?No procedures performed ? ?Clinical Data: ?No additional findings. ? ?ROS: ? ?All other systems negative, except as noted  in the HPI. ?Review of Systems ? ?Objective: ?Vital Signs: There were no vitals taken for this visit. ? ?Specialty Comments:  ?No specialty comments available. ? ?PMFS History: ?Patient Active Problem List  ? Diagnosis Date Noted  ? Abscess of left foot   ? History of transmetatarsal amputation of left foot ([place])   ? Severe protein-calorie malnutrition ([place])   ? CKD (chronic kidney disease) stage 2, GFR 60-89 ml/min 10/22/2021  ? Hyponatremia 10/22/2021  ? Chronic foot pain 08/29/2021  ? Iron deficiency anemia 12/19/2020  ? Cellulitis of left foot   ? History of complete ray amputation of fifth toe of left foot ([place])   ? Status post amputation of right great toe ([place])   ? Diabetic foot infection ([place]) 12/08/2020  ? Diabetic foot ulcers ([place]) 03/02/2019  ? Osteomyelitis of fourth toe of left foot ([place]) 03/02/2019  ? Hypokalemia 03/02/2019  ? Chronic right shoulder pain 12/02/2018  ? Diabetes mellitus with complication ([place]) [id number]  ? Right hip pain 12/27/2015  ? MRSA (methicillin resistant staph aureus) culture positive 12/31/2012  ? Open displaced fracture of right great toe 05/20/2012  ? Anxiety state 12/25/2008  ? Coronary atherosclerosis 12/25/2008  ? CHEST PAIN 12/25/2008  ? Hypothyroidism 05/27/2007  ? Dyslipidemia 05/27/2007  ? Gout 05/27/2007  ? Essential hypertension 05/27/2007  ? Osteoarthrosis, unspecified whether generalized or localized, unspecified site 05/27/2007  ? RENAL CALCULUS, HX OF 05/27/2007  ? ?Past Medical History:  ?Diagnosis Date  ? Anxiety   ? CAD (coronary artery disease)   ? sees Dr. Stanford Breed, normal Stress test 07-16-12   ? Diabetes mellitus   ? DJD (degenerative joint disease)   ? Gout   ? Hyperlipidemia   ? Hypertension   ? Hypothyroid   ? Osteoarthritis   ? Renal calculus   ? hx  ? Umbilical hernia   ?  ?Family History  ?Problem Relation Age of Onset  ? Heart attack Mother   ? Arthritis Other   ? Coronary artery disease Other   ? Diabetes Other   ? Hypertension Other   ? Prostate  cancer Other   ? Stroke Other   ?  ?Past Surgical History:  ?Procedure Laterality Date  ? AMPUTATION  07/17/2012  ? Procedure: AMPUTATION RAY;  Surgeon: Wylene Simmer, MD;  Location: [facility];  Service: Orthopedics;  Laterality: Right;  right hallux amputation (1st ray resection )  ? AMPUTATION Left 03/06/2019  ? Procedure: Ray Amputation left foot;  Surgeon: Nicholes Stairs, MD;  Location: [facility];  Service: Orthopedics;  Laterality: Left;  ? AMPUTATION Left 10/27/2021  ? Procedure: LEFT BELOW KNEE AMPUTATION;  Surgeon: Newt Minion, MD;  Location: [facility];  Service:  Orthopedics;  Laterality: Left;  ? CORONARY ANGIOPLASTY WITH STENT PLACEMENT    ? FINGER SURGERY    ? left index finger  ? I & D EXTREMITY  05/19/2012  ? Procedure: IRRIGATION AND DEBRIDEMENT EXTREMITY;  Surgeon: Marin Shutter, MD;  Location: [facility];  Service: Orthopedics;  Laterality: Right;  Right Great toe  ? I & D EXTREMITY Left 10/25/2021  ? Procedure: LEFT FOOT [place];  Surgeon: Newt Minion, MD;  Location: [facility];  Service: Orthopedics;  Laterality: Left;  ? I & D Toe  05/19/2012  ? rt great toe  ? TONSILLECTOMY    ? TRANSMETATARSAL AMPUTATION Left 12/10/2020  ? Procedure: TRANSMETATARSAL AMPUTATION LEFT;  Surgeon: Newt Minion, MD;  Location: [facility];  Service: Orthopedics;  Laterality: Left;  ? ?Social History  ? ?Occupational History  ? Not on file  ?Tobacco Use  ? Smoking status: Never  ? Smokeless tobacco: Current  ?  Types: Chew  ? Tobacco comments:  ?  occ  ?Vaping Use  ? Vaping Use: Never used  ?Substance and Sexual Activity  ? Alcohol use: Yes  ?  Alcohol/week: 0.0 standard drinks  ?  Comment: weekends  ? Drug use: No  ? Sexual activity: Yes  ? ? ? ? ? ?

## 2021-11-06 ENCOUNTER — Telehealth: Payer: Self-pay

## 2021-11-06 NOTE — Telephone Encounter (Signed)
Left pt a detailed message regarding his FMLA paperwork, advised that per Dr Clent Ridges [facility] was faxed to Dr Lajoyce Corners  Pt Orthopedic ?

## 2021-11-07 ENCOUNTER — Ambulatory Visit (INDEPENDENT_AMBULATORY_CARE_PROVIDER_SITE_OTHER): Payer: No Typology Code available for payment source | Admitting: Family Medicine

## 2021-11-07 ENCOUNTER — Encounter: Payer: Self-pay | Admitting: Family Medicine

## 2021-11-07 ENCOUNTER — Telehealth: Payer: Self-pay | Admitting: Orthopedic Surgery

## 2021-11-07 VITALS — BP 132/70 | HR 56 | Temp 98.7°F | Wt 192.0 lb

## 2021-11-07 DIAGNOSIS — E118 Type 2 diabetes mellitus with unspecified complications: Secondary | ICD-10-CM | POA: Diagnosis not present

## 2021-11-07 DIAGNOSIS — L089 Local infection of the skin and subcutaneous tissue, unspecified: Secondary | ICD-10-CM

## 2021-11-07 DIAGNOSIS — I2510 Atherosclerotic heart disease of native coronary artery without angina pectoris: Secondary | ICD-10-CM

## 2021-11-07 DIAGNOSIS — E11628 Type 2 diabetes mellitus with other skin complications: Secondary | ICD-10-CM

## 2021-11-07 DIAGNOSIS — N182 Chronic kidney disease, stage 2 (mild): Secondary | ICD-10-CM

## 2021-11-07 DIAGNOSIS — I10 Essential (primary) hypertension: Secondary | ICD-10-CM

## 2021-11-07 MED ORDER — MUPIROCIN 2 % EX OINT: 1.0000 "application " | TOPICAL_OINTMENT | Freq: Two times a day (BID) | CUTANEOUS | 5 refills | Status: DC

## 2021-11-07 MED ORDER — NITROGLYCERIN 0.4 MG SL SUBL: 0.4000 mg | SUBLINGUAL_TABLET | SUBLINGUAL | 5 refills | Status: DC | PRN

## 2021-11-07 MED ORDER — OXYCODONE-ACETAMINOPHEN 5-325 MG PO TABS: 1.0000 | ORAL_TABLET | ORAL | 0 refills | Status: AC | PRN

## 2021-11-07 NOTE — Telephone Encounter (Signed)
[employer] aid/development worker received order for prosthesis. They are requesting 11/02/2021 ov note & demographics. I faxed [phone]. Ph [phone] ?

## 2021-11-07 NOTE — Progress Notes (Signed)
? ?  Subjective:  ? ? Patient ID: Robert Huang [id number], male    DOB: 18-Sep-1960, 61 y.o.   MRN: [id number] ? ?HPI ?Here to follow up a hospital stay from 10-22-21 to 10-28-21 for osteomyelitis in the left foot. He wound up having a left below the knee amputation per Dr. Meridee Score, and the surgery went well. Wound cultures revealed growth of Proteus and Enterococcus strains of bacteria, and he received IV antibiotics for this. His BP was stable throughout the stay. His A1c after admission was 7.2. His creatinine bumped up a little on admission but had settled down by the day of DC. Creatinine that day was 1.30 and the GFR was >60. Since going home he has done well. He is taking Percocet for pain control. We have him in a pain management program, and we last met for this on 08-29-21. He asks for a short term extra batch as he recovers. He is off all antibiotics. He is driving his own vehicle. ? ? ?Review of Systems  ?Constitutional: Negative.   ?Respiratory: Negative.    ?Cardiovascular: Negative.   ? ?   ?Objective:  ? Physical Exam ?Constitutional:   ?   Appearance: He is not ill-appearing.  ?   Comments: In a wheelchair   ?Cardiovascular:  ?   Rate and Rhythm: Normal rate and regular rhythm.  ?   Pulses: Normal pulses.  ?   Heart sounds: Normal heart sounds.  ?Pulmonary:  ?   Effort: Pulmonary effort is normal.  ?   Breath sounds: Normal breath sounds.  ?Neurological:  ?   General: No focal deficit present.  ?   Mental Status: He is alert and oriented to person, place, and time.  ? ? ? ? ? ?   ?Assessment & Plan:  ?He is recovering from a BTK amputation, and he seems to be doing well. His spirits are bright and he is committed to a full recovery. He will follow up with Dr. Sharol Given on 11-21-21. He will begin rehab soon and will be fitted with a prosthesis. We will check another BMET today to follow the renal function. His diabetes seems to be stable for now. HTN is well controlled. For pain control, we gave him #30 more  Percocets today, and then we will resume our pain management program. We spent a total of ( 35  ) minutes reviewing records and discussing these issues.  ?Alysia Penna, MD ? ? ?

## 2021-11-15 ENCOUNTER — Other Ambulatory Visit: Payer: Self-pay | Admitting: Family Medicine

## 2021-11-21 ENCOUNTER — Encounter: Payer: Self-pay | Admitting: Orthopedic Surgery

## 2021-11-21 ENCOUNTER — Other Ambulatory Visit: Payer: Self-pay

## 2021-11-21 ENCOUNTER — Ambulatory Visit (INDEPENDENT_AMBULATORY_CARE_PROVIDER_SITE_OTHER): Payer: No Typology Code available for payment source | Admitting: Orthopedic Surgery

## 2021-11-21 DIAGNOSIS — Z89512 Acquired absence of left leg below knee: Secondary | ICD-10-CM

## 2021-11-21 DIAGNOSIS — S88112A Complete traumatic amputation at level between knee and ankle, left lower leg, initial encounter: Secondary | ICD-10-CM

## 2021-11-21 NOTE — Progress Notes (Signed)
? ?Office Visit Note ?  ?Patient: Robert Huang [id number]           ?Date of Birth: 1961-05-12           ?MRN: [id number] ?Visit Date: 11/21/2021 ?             ?Requested by: [name], MD ?[name] ?[place],  [place] [address] ?PCP: [name], MD ? ?Chief Complaint  ?Patient presents with  ? Left Leg - Routine Post Op  ?  10/27/21 left BKA   ? ? ? ? ?HPI: ?Patient is a 61 year old gentleman who presents 3 weeks status post a left transtibial amputation.  He is currently wearing a 4 extra-large stump shrinker.  He is going to obtain his prosthesis in [place] at [facility]. ? ?Assessment & Plan: ?Visit Diagnoses:  ?1. Below-knee amputation of left lower extremity ([place])   ? ? ?Plan: Staples are harvested continue with his stump shrinker he still needs a little consolidation.  Patient was provided a prescription for a K3 level prosthesis material supplies. ? ?Follow-Up Instructions: Return in about 2 weeks (around 12/05/2021).  ? ?Ortho Exam ? ?Patient is alert, oriented, no adenopathy, well-dressed, normal affect, normal respiratory effort. ?Examination the incision is well-healed there is still a little bit of swelling there is no redness no drainage.  Staples are harvested today. ? ?Patient is a new left transtibial  amputee. ? ?Patient's current comorbidities are not expected to impact the ability to function with the prescribed prosthesis. ?Patient verbally communicates a strong desire to use a prosthesis. ?Patient currently requires mobility aids to ambulate without a prosthesis.  Expects not to use mobility aids with a new prosthesis. ? ?Patient is a K3 level ambulator that spends a lot of time walking around on uneven terrain over obstacles, up and down stairs, and ambulates with a variable cadence. ? ? ? ? ?Imaging: ?No results found. ?No images are attached to the encounter. ? ?Labs: ?Lab Results  ?Component Value Date  ? HGBA1C 7.2 (H) 10/24/2021  ? HGBA1C 6.6 (H)  12/08/2020  ? HGBA1C 7.5 (H) 09/28/2020  ? ESRSEDRATE >140 (H) 10/22/2021  ? ESRSEDRATE 50 (H) 03/02/2019  ? ESRSEDRATE 63 (H) 07/17/2012  ? CRP 20.1 (H) 10/22/2021  ? CRP 2.9 (H) 03/02/2019  ? CRP 1.3 (H) 07/17/2012  ? LABURIC 7.2 12/03/2017  ? LABURIC 7.6 01/11/2017  ? LABURIC 12.7 (H) 11/05/2014  ? REPTSTATUS 11/02/2021 FINAL 10/25/2021  ? GRAMSTAIN  10/25/2021  ?  RARE WBC PRESENT, PREDOMINANTLY MONONUCLEAR ?RARE GRAM POSITIVE COCCI IN PAIRS ?  ? CULT  10/25/2021  ?  FEW PROTEUS MIRABILIS ?RARE ENTEROCOCCUS FAECALIS ?RARE BACTEROIDES FRAGILIS ?BETA LACTAMASE POSITIVE ?Performed at [facility], [place] [address], [place], [place] [address] ?  ? [place] 10/25/2021  ? LABORGA ENTEROCOCCUS FAECALIS 10/25/2021  ? ? ? ?Lab Results  ?Component Value Date  ? ALBUMIN 2.1 (L) 10/23/2021  ? ALBUMIN 3.0 (L) 12/08/2020  ? ALBUMIN 3.9 09/28/2020  ? ? ?Lab Results  ?Component Value Date  ? MG 1.6 (L) 10/26/2021  ? MG 1.5 (L) 10/25/2021  ? MG 1.6 (L) 10/24/2021  ? ?No results found for: VD25OH ? ?No results found for: PREALBUMIN ? ?  Latest Ref Rng & Units 10/28/2021  ?  1:35 AM 10/26/2021  ?  3:41 AM 10/24/2021  ?  4:35 AM  ?CBC EXTENDED  ?WBC 4.0 - 10.5 K/uL 9.6   10.1   8.6    ?  RBC 4.22 - 5.81 MIL/uL 3.20   3.22   3.55    ?Hemoglobin 13.0 - 17.0 g/dL 9.2   9.2   10.0    ?HCT 39.0 - 52.0 % 28.8   29.2   31.7    ?Platelets 150 - 400 K/uL 296   294   265    ?NEUT# 1.7 - 7.7 K/uL  8.8   6.2    ?Lymph# 0.7 - 4.0 K/uL  0.5   1.0    ? ? ? ?There is no height or weight on file to calculate BMI. ? ?Orders:  ?No orders of the defined types were placed in this encounter. ? ?No orders of the defined types were placed in this encounter. ? ? ? Procedures: ?No procedures performed ? ?Clinical Data: ?No additional findings. ? ?ROS: ? ?All other systems negative, except as noted in the HPI. ?Review of Systems ? ?Objective: ?Vital Signs: There were no vitals taken for this visit. ? ?Specialty Comments:  ?No specialty comments  available. ? ?PMFS History: ?Patient Active Problem List  ? Diagnosis Date Noted  ? Abscess of left foot   ? History of transmetatarsal amputation of left foot ([place])   ? Severe protein-calorie malnutrition ([place])   ? CKD (chronic kidney disease) stage 2, GFR 60-89 ml/min 10/22/2021  ? Hyponatremia 10/22/2021  ? Chronic foot pain 08/29/2021  ? Iron deficiency anemia 12/19/2020  ? Cellulitis of left foot   ? History of complete ray amputation of fifth toe of left foot ([place])   ? Status post amputation of right great toe ([place])   ? Diabetic foot infection ([place]) 12/08/2020  ? Diabetic foot ulcers ([place]) 03/02/2019  ? Osteomyelitis of fourth toe of left foot ([place]) 03/02/2019  ? Hypokalemia 03/02/2019  ? Chronic right shoulder pain 12/02/2018  ? Diabetes mellitus with complication ([place]) [id number]  ? Right hip pain 12/27/2015  ? MRSA (methicillin resistant staph aureus) culture positive 12/31/2012  ? Open displaced fracture of right great toe 05/20/2012  ? Anxiety state 12/25/2008  ? Coronary atherosclerosis 12/25/2008  ? CHEST PAIN 12/25/2008  ? Hypothyroidism 05/27/2007  ? Dyslipidemia 05/27/2007  ? Gout 05/27/2007  ? Essential hypertension 05/27/2007  ? Osteoarthrosis, unspecified whether generalized or localized, unspecified site 05/27/2007  ? RENAL CALCULUS, HX OF 05/27/2007  ? ?Past Medical History:  ?Diagnosis Date  ? Anxiety   ? CAD (coronary artery disease)   ? sees Dr. Stanford Breed, normal Stress test 07-16-12   ? Diabetes mellitus   ? DJD (degenerative joint disease)   ? Gout   ? Hyperlipidemia   ? Hypertension   ? Hypothyroid   ? Osteoarthritis   ? Renal calculus   ? hx  ? Umbilical hernia   ?  ?Family History  ?Problem Relation Age of Onset  ? Heart attack Mother   ? Arthritis Other   ? Coronary artery disease Other   ? Diabetes Other   ? Hypertension Other   ? Prostate cancer Other   ? Stroke Other   ?  ?Past Surgical History:  ?Procedure Laterality Date  ? AMPUTATION  07/17/2012  ? Procedure: AMPUTATION RAY;   Surgeon: Wylene Simmer, MD;  Location: [facility];  Service: Orthopedics;  Laterality: Right;  right hallux amputation (1st ray resection )  ? AMPUTATION Left 03/06/2019  ? Procedure: Ray Amputation left foot;  Surgeon: Nicholes Stairs, MD;  Location[facility];  Service: Orthopedics;  Laterality: Left;  ? AMPUTATION Left 10/27/2021  ? Procedure: LEFT BELOW  KNEE AMPUTATION;  Surgeon: Newt Minion, MD;  Location: [facility];  Service: Orthopedics;  Laterality: Left;  ? CORONARY ANGIOPLASTY WITH STENT PLACEMENT    ? FINGER SURGERY    ? left index finger  ? I & D EXTREMITY  05/19/2012  ? Procedure: IRRIGATION AND DEBRIDEMENT EXTREMITY;  Surgeon: Marin Shutter, MD;  Location: [facility];  Service: Orthopedics;  Laterality: Right;  Right Great toe  ? I & D EXTREMITY Left 10/25/2021  ? Procedure: LEFT FOOT [place];  Surgeon: Newt Minion, MD;  Location: [facility];  Service: Orthopedics;  Laterality: Left;  ? I & D Toe  05/19/2012  ? rt great toe  ? TONSILLECTOMY    ? TRANSMETATARSAL AMPUTATION Left 12/10/2020  ? Procedure: TRANSMETATARSAL AMPUTATION LEFT;  Surgeon: Newt Minion, MD;  Location: [facility];  Service: Orthopedics;  Laterality: Left;  ? ?Social History  ? ?Occupational History  ? Not on file  ?Tobacco Use  ? Smoking status: Never  ? Smokeless tobacco: Current  ?  Types: Chew  ? Tobacco comments:  ?  occ  ?Vaping Use  ? Vaping Use: Never used  ?Substance and Sexual Activity  ? Alcohol use: Yes  ?  Alcohol/week: 0.0 standard drinks  ?  Comment: weekends  ? Drug use: No  ? Sexual activity: Yes  ? ? ? ? ? ?

## 2021-11-24 ENCOUNTER — Other Ambulatory Visit: Payer: Self-pay | Admitting: Family Medicine

## 2021-11-24 DIAGNOSIS — E7800 Pure hypercholesterolemia, unspecified: Secondary | ICD-10-CM

## 2021-11-28 ENCOUNTER — Other Ambulatory Visit: Payer: Self-pay | Admitting: Family Medicine

## 2021-11-28 DIAGNOSIS — I10 Essential (primary) hypertension: Secondary | ICD-10-CM

## 2021-11-28 DIAGNOSIS — E785 Hyperlipidemia, unspecified: Secondary | ICD-10-CM

## 2021-11-29 ENCOUNTER — Telehealth: Payer: Self-pay | Admitting: Orthopedic Surgery

## 2021-11-29 ENCOUNTER — Other Ambulatory Visit: Payer: Self-pay

## 2021-11-29 NOTE — Telephone Encounter (Signed)
11/21/21 ov note faxed to [facility] [phone] ?

## 2021-12-05 ENCOUNTER — Encounter: Payer: Self-pay | Admitting: Family Medicine

## 2021-12-05 ENCOUNTER — Ambulatory Visit (INDEPENDENT_AMBULATORY_CARE_PROVIDER_SITE_OTHER): Payer: No Typology Code available for payment source | Admitting: Family Medicine

## 2021-12-05 ENCOUNTER — Ambulatory Visit (INDEPENDENT_AMBULATORY_CARE_PROVIDER_SITE_OTHER): Payer: No Typology Code available for payment source | Admitting: Orthopedic Surgery

## 2021-12-05 VITALS — BP 128/62 | HR 59 | Temp 98.5°F | Wt 195.1 lb

## 2021-12-05 DIAGNOSIS — Z89512 Acquired absence of left leg below knee: Secondary | ICD-10-CM

## 2021-12-05 DIAGNOSIS — S88112A Complete traumatic amputation at level between knee and ankle, left lower leg, initial encounter: Secondary | ICD-10-CM

## 2021-12-05 DIAGNOSIS — G546 Phantom limb syndrome with pain: Secondary | ICD-10-CM | POA: Diagnosis not present

## 2021-12-05 DIAGNOSIS — F1190 Opioid use, unspecified, uncomplicated: Secondary | ICD-10-CM | POA: Diagnosis not present

## 2021-12-05 MED ORDER — HYDROCODONE-ACETAMINOPHEN 10-325 MG PO TABS: 1.0000 | ORAL_TABLET | Freq: Four times a day (QID) | ORAL | 0 refills | Status: AC | PRN

## 2021-12-05 MED ORDER — HYDROCODONE-ACETAMINOPHEN 10-325 MG PO TABS
1.0000 | ORAL_TABLET | Freq: Four times a day (QID) | ORAL | 0 refills | Status: DC | PRN
Start: 2022-01-04 — End: 2021-12-05

## 2021-12-05 MED ORDER — HYDROCODONE-ACETAMINOPHEN 10-325 MG PO TABS: 1.0000 | ORAL_TABLET | Freq: Four times a day (QID) | ORAL | 0 refills | Status: DC | PRN

## 2021-12-05 NOTE — Progress Notes (Signed)
? ?  Subjective:  ? ? Patient ID: Gaje Seckinger [id number], male    DOB: [birth year], 61 y.o.   MRN: [id number] ? ?HPI ?Here for pain management. He is recovering from a left below the knee amputation. The wound is slowly healing. He cannot he fitted for a prosthesis until  ?The wound has totally closed however. He has a good deal of phantom pain now, and he says it feels exactly the same as it did before the surgery.  ? ?Review of Systems  ?Constitutional: Negative.   ?Respiratory: Negative.    ?Cardiovascular: Negative.   ?Musculoskeletal:  Positive for myalgias.  ? ?   ?Objective:  ? Physical Exam ?Constitutional:   ?   Appearance: Normal appearance.  ?   Comments: In a wheelchair   ?Neurological:  ?   Mental Status: He is alert.  ? ? ? ? ? ?   ?Assessment & Plan:  ?Pain management.  ?Indication for chronic opioid: phantom limb pain  ?Medication and dose: Norco 10-325  ?# pills per month: 120 ?Last UDS date: 12-05-41 ?Opioid Treatment Agreement signed (Y/N): 12-10-18 ?Opioid Treatment Agreement last reviewed with patient:  12-05-21 ?NCCS[facility] reviewed this encounter (include red flags): Yes ?Meds were refilled.  ?Alysia Penna, MD ? ? ?

## 2021-12-09 LAB — DM TEMPLATE

## 2021-12-09 LAB — DRUG MONITOR, PANEL 1, W/CONF, URINE
Alphahydroxyalprazolam: 155 ng/mL — ABNORMAL HIGH (ref <25)
Alphahydroxymidazolam: NEGATIVE ng/mL (ref <50)
Alphahydroxytriazolam: NEGATIVE ng/mL (ref <50)
Aminoclonazepam: NEGATIVE ng/mL (ref <25)
Amphetamines: NEGATIVE ng/mL (ref <500)
Barbiturates: NEGATIVE ng/mL (ref <300)
Benzodiazepines: POSITIVE ng/mL — AB (ref <100)
Cocaine Metabolite: NEGATIVE ng/mL (ref <150)
Codeine: NEGATIVE ng/mL (ref <50)
Creatinine: 113.9 mg/dL (ref >=20.0)
Hydrocodone: 1275 ng/mL — ABNORMAL HIGH (ref <50)
Hydromorphone: 492 ng/mL — ABNORMAL HIGH (ref <50)
Hydroxyethylflurazepam: NEGATIVE ng/mL (ref <50)
Lorazepam: NEGATIVE ng/mL (ref <50)
Marijuana Metabolite: 175 ng/mL — ABNORMAL HIGH (ref <5)
Marijuana Metabolite: POSITIVE ng/mL — AB (ref <20)
Methadone Metabolite: NEGATIVE ng/mL (ref <100)
Morphine: NEGATIVE ng/mL (ref <50)
Nordiazepam: NEGATIVE ng/mL (ref <50)
Norhydrocodone: 1960 ng/mL — ABNORMAL HIGH (ref <50)
Noroxycodone: 572 ng/mL — ABNORMAL HIGH (ref <50)
Opiates: POSITIVE ng/mL — AB (ref <100)
Oxazepam: NEGATIVE ng/mL (ref <50)
Oxidant: NEGATIVE ug/mL (ref <200)
Oxycodone: 136 ng/mL — ABNORMAL HIGH (ref <50)
Oxycodone: POSITIVE ng/mL — AB (ref <100)
Oxymorphone: 287 ng/mL — ABNORMAL HIGH (ref <50)
Phencyclidine: NEGATIVE ng/mL (ref <25)
Temazepam: NEGATIVE ng/mL (ref <50)
pH: 5.5 (ref 4.5–9.0)

## 2021-12-12 ENCOUNTER — Encounter: Payer: Self-pay | Admitting: Orthopedic Surgery

## 2021-12-12 NOTE — Progress Notes (Signed)
? ?Office Visit Note ?  ?Patient: Robert Huang [id number]           ?Date of Birth: 12/24/1960           ?MRN: [id number] ?Visit Date: 12/05/2021 ?             ?Requested by: [name], MD ?[address] ?[place],  [place] [address] ?PCP: [name], MD ? ?Chief Complaint  ?Patient presents with  ? Left Leg - Routine Post Op  ?  10/27/21 left BKA   ? ? ? ? ?HPI: ?Patient is a 61 year old gentleman who presents 5 weeks status post left below-knee amputation.  Patient has been provided a prescription for a K3 level prosthesis. ? ?Assessment & Plan: ?Visit Diagnoses:  ?1. Below-knee amputation of left lower extremity ([place])   ? ? ?Plan: Patient is being fit with his prosthesis in 2 weeks.  He was given a new prescription for the prosthetic fitting follow-up in 4 weeks anticipate outpatient therapy. ? ?We will complete his disability paperwork. ? ?Follow-Up Instructions: Return in about 4 weeks (around 01/02/2022).  ? ?Ortho Exam ? ?Patient is alert, oriented, no adenopathy, well-dressed, normal affect, normal respiratory effort. ?Examination there is decreased swelling there is 1 remaining staple laterally.  There are 2 small granulating wounds laterally about 5 mm in diameter which are not concerning for infection there is no drainage there is no depth to the wound no cellulitis. ? ?Imaging: ?No results found. ?No images are attached to the encounter. ? ?Labs: ?Lab Results  ?Component Value Date  ? HGBA1C 7.2 (H) 10/24/2021  ? HGBA1C 6.6 (H) 12/08/2020  ? HGBA1C 7.5 (H) 09/28/2020  ? ESRSEDRATE >140 (H) 10/22/2021  ? ESRSEDRATE 50 (H) 03/02/2019  ? ESRSEDRATE 63 (H) 07/17/2012  ? CRP 20.1 (H) 10/22/2021  ? CRP 2.9 (H) 03/02/2019  ? CRP 1.3 (H) 07/17/2012  ? LABURIC 7.2 12/03/2017  ? LABURIC 7.6 01/11/2017  ? LABURIC 12.7 (H) 11/05/2014  ? REPTSTATUS 11/02/2021 FINAL 10/25/2021  ? GRAMSTAIN  10/25/2021  ?  RARE WBC PRESENT, PREDOMINANTLY MONONUCLEAR ?RARE GRAM POSITIVE COCCI IN PAIRS ?  ? CULT  10/25/2021  ?  FEW  PROTEUS MIRABILIS ?RARE ENTEROCOCCUS FAECALIS ?RARE BACTEROIDES FRAGILIS ?BETA LACTAMASE POSITIVE ?Performed at [facility], [place] [address], [place], [place] [address] ?  ? [place] 10/25/2021  ? LABORGA ENTEROCOCCUS FAECALIS 10/25/2021  ? ? ? ?Lab Results  ?Component Value Date  ? ALBUMIN 2.1 (L) 10/23/2021  ? ALBUMIN 3.0 (L) 12/08/2020  ? ALBUMIN 3.9 09/28/2020  ? ? ?Lab Results  ?Component Value Date  ? MG 1.6 (L) 10/26/2021  ? MG 1.5 (L) 10/25/2021  ? MG 1.6 (L) 10/24/2021  ? ?No results found for: VD25OH ? ?No results found for: PREALBUMIN ? ?  Latest Ref Rng & Units 10/28/2021  ?  1:35 AM 10/26/2021  ?  3:41 AM 10/24/2021  ?  4:35 AM  ?CBC EXTENDED  ?WBC 4.0 - 10.5 K/uL 9.6   10.1   8.6    ?RBC 4.22 - 5.81 MIL/uL 3.20   3.22   3.55    ?Hemoglobin 13.0 - 17.0 g/dL 9.2   9.2   10.0    ?HCT 39.0 - 52.0 % 28.8   29.2   31.7    ?Platelets 150 - 400 K/uL 296   294   265    ?NEUT# 1.7 - 7.7 K/uL  8.8   6.2    ?Lymph# 0.7 -  4.0 K/uL  0.5   1.0    ? ? ? ?There is no height or weight on file to calculate BMI. ? ?Orders:  ?No orders of the defined types were placed in this encounter. ? ?No orders of the defined types were placed in this encounter. ? ? ? Procedures: ?No procedures performed ? ?Clinical Data: ?No additional findings. ? ?ROS: ? ?All other systems negative, except as noted in the HPI. ?Review of Systems ? ?Objective: ?Vital Signs: There were no vitals taken for this visit. ? ?Specialty Comments:  ?No specialty comments available. ? ?PMFS History: ?Patient Active Problem List  ? Diagnosis Date Noted  ? Abscess of left foot   ? History of transmetatarsal amputation of left foot ([place])   ? Severe protein-calorie malnutrition ([place])   ? CKD (chronic kidney disease) stage 2, GFR 60-89 ml/min 10/22/2021  ? Hyponatremia 10/22/2021  ? Chronic foot pain 08/29/2021  ? Iron deficiency anemia 12/19/2020  ? Cellulitis of left foot   ? History of complete ray amputation of fifth toe of left foot ([place])   ?  Status post amputation of right great toe ([place])   ? Diabetic foot infection ([place]) 12/08/2020  ? Diabetic foot ulcers ([place]) 03/02/2019  ? Osteomyelitis of fourth toe of left foot ([place]) 03/02/2019  ? Hypokalemia 03/02/2019  ? Chronic right shoulder pain 12/02/2018  ? Diabetes mellitus with complication ([place]) [id number]  ? Right hip pain 12/27/2015  ? MRSA (methicillin resistant staph aureus) culture positive 12/31/2012  ? Open displaced fracture of right great toe 05/20/2012  ? Anxiety state 12/25/2008  ? Coronary atherosclerosis 12/25/2008  ? CHEST PAIN 12/25/2008  ? Hypothyroidism 05/27/2007  ? Dyslipidemia 05/27/2007  ? Gout 05/27/2007  ? Essential hypertension 05/27/2007  ? Osteoarthrosis, unspecified whether generalized or localized, unspecified site 05/27/2007  ? RENAL CALCULUS, HX OF 05/27/2007  ? ?Past Medical History:  ?Diagnosis Date  ? Anxiety   ? CAD (coronary artery disease)   ? sees Dr. Stanford Breed, normal Stress test 07-16-12   ? Diabetes mellitus   ? DJD (degenerative joint disease)   ? Gout   ? Hyperlipidemia   ? Hypertension   ? Hypothyroid   ? Osteoarthritis   ? Renal calculus   ? hx  ? Umbilical hernia   ?  ?Family History  ?Problem Relation Age of Onset  ? Heart attack Mother   ? Arthritis Other   ? Coronary artery disease Other   ? Diabetes Other   ? Hypertension Other   ? Prostate cancer Other   ? Stroke Other   ?  ?Past Surgical History:  ?Procedure Laterality Date  ? AMPUTATION  07/17/2012  ? Procedure: AMPUTATION RAY;  Surgeon: Wylene Simmer, MD;  Location: [facility];  Service: Orthopedics;  Laterality: Right;  right hallux amputation (1st ray resection )  ? AMPUTATION Left 03/06/2019  ? Procedure: Ray Amputation left foot;  Surgeon: Nicholes Stairs, MD;  Location: [facility];  Service: Orthopedics;  Laterality: Left;  ? AMPUTATION Left 10/27/2021  ? Procedure: LEFT BELOW KNEE AMPUTATION;  Surgeon: Newt Minion, MD;  Location: [facility];  Service: Orthopedics;  Laterality: Left;  ? CORONARY ANGIOPLASTY  WITH STENT PLACEMENT    ? FINGER SURGERY    ? left index finger  ? I & D EXTREMITY  05/19/2012  ? Procedure: IRRIGATION AND DEBRIDEMENT EXTREMITY;  Surgeon: Marin Shutter, MD;  Location: [facility];  Service: Orthopedics;  Laterality: Right;  Right Great toe  ? I &  D EXTREMITY Left 10/25/2021  ? Procedure: LEFT FOOT [place];  Surgeon: Newt Minion, MD;  Location: [facility];  Service: Orthopedics;  Laterality: Left;  ? I & D Toe  05/19/2012  ? rt great toe  ? TONSILLECTOMY    ? TRANSMETATARSAL AMPUTATION Left 12/10/2020  ? Procedure: TRANSMETATARSAL AMPUTATION LEFT;  Surgeon: Newt Minion, MD;  Location: [facility];  Service: Orthopedics;  Laterality: Left;  ? ?Social History  ? ?Occupational History  ? Not on file  ?Tobacco Use  ? Smoking status: Never  ? Smokeless tobacco: Current  ?  Types: Chew  ? Tobacco comments:  ?  occ  ?Vaping Use  ? Vaping Use: Never used  ?Substance and Sexual Activity  ? Alcohol use: Yes  ?  Alcohol/week: 0.0 standard drinks  ?  Comment: weekends  ? Drug use: No  ? Sexual activity: Yes  ? ? ? ? ? ?

## 2021-12-14 ENCOUNTER — Encounter: Payer: Self-pay | Admitting: Family Medicine

## 2021-12-14 ENCOUNTER — Other Ambulatory Visit: Payer: Self-pay

## 2021-12-14 DIAGNOSIS — E118 Type 2 diabetes mellitus with unspecified complications: Secondary | ICD-10-CM

## 2021-12-14 MED ORDER — GLIPIZIDE 5 MG PO TABS: ORAL_TABLET | ORAL | 1 refills | Status: DC

## 2021-12-25 ENCOUNTER — Other Ambulatory Visit: Payer: Self-pay

## 2021-12-25 DIAGNOSIS — I10 Essential (primary) hypertension: Secondary | ICD-10-CM

## 2021-12-25 MED ORDER — CLONIDINE HCL 0.3 MG PO TABS: 0.3000 mg | ORAL_TABLET | Freq: Two times a day (BID) | ORAL | 0 refills | Status: DC

## 2022-01-02 ENCOUNTER — Ambulatory Visit (INDEPENDENT_AMBULATORY_CARE_PROVIDER_SITE_OTHER): Payer: No Typology Code available for payment source | Admitting: Orthopedic Surgery

## 2022-01-02 DIAGNOSIS — S88112A Complete traumatic amputation at level between knee and ankle, left lower leg, initial encounter: Secondary | ICD-10-CM

## 2022-01-02 MED ORDER — DOXYCYCLINE HYCLATE 100 MG PO TABS: 100.0000 mg | ORAL_TABLET | Freq: Two times a day (BID) | ORAL | 0 refills | Status: DC

## 2022-01-04 ENCOUNTER — Other Ambulatory Visit: Payer: Self-pay | Admitting: Family Medicine

## 2022-01-04 NOTE — Telephone Encounter (Signed)
Pt LOV was on 12/05/2021 ?Last refill done on 05/10/2021 ?Please advise ?

## 2022-01-06 ENCOUNTER — Other Ambulatory Visit: Payer: Self-pay | Admitting: Family Medicine

## 2022-01-08 NOTE — Telephone Encounter (Signed)
Last OV- 12/05/21 ?Last refill-12/19/2020---11 refills ? ?No future OV scheduled.   Can this patient receive a refill? ?

## 2022-01-09 ENCOUNTER — Encounter: Payer: Self-pay | Admitting: Cardiology

## 2022-01-10 NOTE — Telephone Encounter (Signed)
Called pt and scheduled appt. No further questions. Pt will come to appt fasting in case lab will be needed. ?

## 2022-01-21 NOTE — Progress Notes (Unsigned)
Cardiology Office Note:    Date:  01/21/2022   ID:  Robert Huang [place], DOB 10/16/1960, MRN [id number]  PCP:  Robert Salisbury, MD   [facility] Providers Cardiologist:  Robert Millers, MD { Click to update primary MD,subspecialty MD or APP then REFRESH:1}    Referring MD: Robert Salisbury, MD   No chief complaint on file. ***  History of Present Illness:    Robert Huang is a 61 y.o. male with a hx of hypertension, hyperlipidemia, DM, anxiety, hypothyroidism, and CAD.  He has a history of DES-LCx, DES-RCA in March 2010.  Nuclear stress test November 2013 was nonischemic.  ABIs 03/03/2019 showed no significant lower arterial disease.    He was last seen by Dr. Jens Huang 05/21/2018 and was doing well at that time.  He reported dyspnea with extreme exertion that was relieved with rest and was not associated with chest pain.  He was recently hospitalized 10/22/2021 - 10/28/2021 with sepsis secondary to deep multiple abscesses.  He was admitted and underwent debridement of large necrotic abscess of left transmetatarsal amputation on 10/25/2021 followed by left lower extremity transtibial amputation on 10/27/2021.   He no longer lives in [place] and requested a routine a follow-up appointment.  He has not been seen in greater than 3 years and this is considered a new patient visit.    CAD DES to RCA, DES to LCX 2010 Continue aspirin, beta-blocker, statin    Hypertension Maintained on 0.3 mg clonidine 2 times daily, 10 mg amlodipine, lisinopril-HCTZ 20-12.5 mg, 100 mg Toprol    Hyperlipidemia with LDL goal less than 55 Maintained on fenofibrate, Zetia, 40 mg rosuvastatin Repeat fasting lipids today     Past Medical History:  Diagnosis Date   Anxiety    CAD (coronary artery disease)    sees Dr. Jens Huang, normal Stress test 07-16-12    Diabetes mellitus    DJD (degenerative joint disease)    Gout    Hyperlipidemia    Hypertension    Hypothyroid    Osteoarthritis     Renal calculus    hx   Umbilical hernia     Past Surgical History:  Procedure Laterality Date   AMPUTATION  07/17/2012   Procedure: AMPUTATION RAY;  Surgeon: Robert Arthurs, MD;  [facility];  Service: Orthopedics;  Laterality: Right;  right hallux amputation (1st ray resection )   AMPUTATION Left 03/06/2019   Procedure: Ray Amputation left foot;  Surgeon: Robert Kida, MD;  Location[facility];  Service: Orthopedics;  Laterality: Left;   AMPUTATION Left 10/27/2021   Procedure: LEFT BELOW KNEE AMPUTATION;  Surgeon: Robert Mustard, MD;  Location: [facility];  Service: Orthopedics;  Laterality: Left;   CORONARY ANGIOPLASTY WITH STENT PLACEMENT     FINGER SURGERY     left index finger   I & D EXTREMITY  05/19/2012   Procedure: IRRIGATION AND DEBRIDEMENT EXTREMITY;  Surgeon: Robert Lange, MD;  Location: [facility];  Service: Orthopedics;  Laterality: Right;  Right Great toe   I & D EXTREMITY Left 10/25/2021   Procedure: LEFT FOOT DEDRIDEMENT;  Surgeon: Robert Mustard, MD;  [facility];  Service: Orthopedics;  Laterality: Left;   I & D Toe  05/19/2012   rt great toe   TONSILLECTOMY     TRANSMETATARSAL AMPUTATION Left 12/10/2020   Procedure: TRANSMETATARSAL AMPUTATION LEFT;  Surgeon: Robert Mustard, MD;  Location: [facility] OR;  Service: Orthopedics;  Laterality: Left;    Current  Medications: No outpatient medications have been marked as taking for the 01/23/22 encounter (Appointment) with Robert Duster, PA.     Allergies:   Patient has no known allergies.   Social History   Socioeconomic History   Marital status: Married    Spouse name: Not on file   Number of children: Not on file   Years of education: Not on file   Highest education level: Not on file  Occupational History   Not on file  Tobacco Use   Smoking status: Never   Smokeless tobacco: Current    Types: Chew   Tobacco comments:    occ  Vaping Use   Vaping Use: Never used  Substance and Sexual Activity   Alcohol use:  Yes    Alcohol/week: 0.0 standard drinks    Comment: weekends   Drug use: No   Sexual activity: Yes  Other Topics Concern   Not on file  Social History Narrative   Not on file   Social Determinants of Health   Financial Resource Strain: Not on file  Food Insecurity: Not on file  Transportation Needs: Not on file  Physical Activity: Not on file  Stress: Not on file  Social Connections: Not on file     Family History: The patient's ***family history includes Arthritis in an other family member; Coronary artery disease in an other family member; Diabetes in an other family member; Heart attack in his mother; Hypertension in an other family member; Prostate cancer in an other family member; Stroke in an other family member.  ROS:   Please see the history of present illness.    *** All other systems reviewed and are negative.  EKGs/Labs/Other Studies Reviewed:    The following studies were reviewed today: ***  EKG:  EKG is *** ordered today.  The ekg ordered today demonstrates ***  Recent Labs: 10/23/2021: ALT 10 10/26/2021: BUN 16; Creatinine, Ser 1.16; Magnesium 1.6; Potassium 4.2; Sodium 138 10/28/2021: Hemoglobin 9.2; Platelets 296  Recent Lipid Panel    Component Value Date/Time   CHOL 170 09/28/2020 0924   CHOL 187 05/26/2018 1606   TRIG 305.0 (H) 09/28/2020 0924   HDL 42.00 09/28/2020 0924   HDL 38 (L) 05/26/2018 1606   CHOLHDL 4 09/28/2020 0924   VLDL 61.0 (H) 09/28/2020 0924   LDLCALC 61 10/03/2018 1304   LDLCALC 91 05/26/2018 1606   LDLDIRECT 88.0 09/28/2020 0924     Risk Assessment/Calculations:   {Does this patient have ATRIAL FIBRILLATION?:[phone]}       Physical Exam:    VS:  There were no vitals taken for this visit.    Wt Readings from Last 3 Encounters:  12/05/21 195 lb 2 oz (88.5 kg)  11/07/21 192 lb (87.1 kg)  10/27/21 200 lb (90.7 kg)     GEN: *** Well nourished, well developed in no acute distress HEENT: Normal NECK: No JVD; No  carotid bruits LYMPHATICS: No lymphadenopathy CARDIAC: ***RRR, no murmurs, rubs, gallops RESPIRATORY:  Clear to auscultation without rales, wheezing or rhonchi  ABDOMEN: Soft, non-tender, non-distended MUSCULOSKELETAL:  No edema; No deformity  SKIN: Warm and dry NEUROLOGIC:  Alert and oriented x 3 PSYCHIATRIC:  Normal affect   ASSESSMENT:    No diagnosis found. PLAN:    In order of problems listed above:  ***      {Are you ordering a CV Procedure (e.g. stress test, cath, DCCV, TEE, etc)?   Press F2        :[id number]}  Medication Adjustments/Labs and Tests Ordered: Current medicines are reviewed at length with the patient today.  Concerns regarding medicines are outlined above.  No orders of the defined types were placed in this encounter.  No orders of the defined types were placed in this encounter.   There are no Patient Instructions on file for this visit.   Signed, Robert Dusterngela Nicole Ailynn Gow, GeorgiaPA  01/21/2022 8:05 PM    [facility]

## 2022-01-23 ENCOUNTER — Encounter: Payer: Self-pay | Admitting: Physician Assistant

## 2022-01-23 ENCOUNTER — Ambulatory Visit: Payer: No Typology Code available for payment source | Admitting: Physician Assistant

## 2022-01-23 ENCOUNTER — Encounter: Payer: No Typology Code available for payment source | Admitting: Orthopedic Surgery

## 2022-01-23 VITALS — BP 140/66 | HR 48 | Ht 72.0 in | Wt 200.6 lb

## 2022-01-23 DIAGNOSIS — I440 Atrioventricular block, first degree: Secondary | ICD-10-CM | POA: Diagnosis not present

## 2022-01-23 DIAGNOSIS — E785 Hyperlipidemia, unspecified: Secondary | ICD-10-CM

## 2022-01-23 DIAGNOSIS — R079 Chest pain, unspecified: Secondary | ICD-10-CM

## 2022-01-23 DIAGNOSIS — R001 Bradycardia, unspecified: Secondary | ICD-10-CM

## 2022-01-23 DIAGNOSIS — I10 Essential (primary) hypertension: Secondary | ICD-10-CM

## 2022-01-23 DIAGNOSIS — L02612 Cutaneous abscess of left foot: Secondary | ICD-10-CM

## 2022-01-23 DIAGNOSIS — I2510 Atherosclerotic heart disease of native coronary artery without angina pectoris: Secondary | ICD-10-CM

## 2022-01-23 DIAGNOSIS — N182 Chronic kidney disease, stage 2 (mild): Secondary | ICD-10-CM

## 2022-01-23 MED ORDER — METOPROLOL TARTRATE 50 MG PO TABS: 50.0000 mg | ORAL_TABLET | Freq: Two times a day (BID) | ORAL | 3 refills | Status: DC

## 2022-01-23 MED ORDER — NITROGLYCERIN 0.4 MG SL SUBL: 0.4000 mg | SUBLINGUAL_TABLET | SUBLINGUAL | 5 refills | Status: DC | PRN

## 2022-01-23 MED ORDER — HYDRALAZINE HCL 10 MG PO TABS: 10.0000 mg | ORAL_TABLET | Freq: Two times a day (BID) | ORAL | 3 refills | Status: DC

## 2022-01-23 NOTE — Patient Instructions (Signed)
Medication Instructions:  Decrease Metoprolol Tartrate to 50 mg ( Take 1 Tablet Twice Daily). Start Hydralazine 10 mg ( Take 1 tablet Twice Daily). *If you need a refill on your cardiac medications before your next appointment, please call your pharmacy*   Lab Work: Lipid Panel, CMET. Today If you have labs (blood work) drawn today and your tests are completely normal, you will receive your results only by: MyChart Message (if you have MyChart) OR A paper copy in the mail If you have any lab test that is abnormal or we need to change your treatment, we will call you to review the results.   Testing/Procedures: No Testing   Follow-Up: At [facility], you and your health needs are our priority.  As part of our continuing mission to provide you with exceptional heart care, we have created designated Provider Care Teams.  These Care Teams include your primary Cardiologist (physician) and Advanced Practice Providers (APPs -  Physician Assistants and Nurse Practitioners) who all work together to provide you with the care you need, when you need it.  We recommend signing up for the patient portal called "MyChart".  Sign up information is provided on this After Visit Summary.  MyChart is used to connect with patients for Virtual Visits (Telemedicine).  Patients are able to view lab/test results, encounter notes, upcoming appointments, etc.  Non-urgent messages can be sent to your provider as well.   To learn more about what you can do with MyChart, go to [email or web address].    Your next appointment:   1 month(s)  The format for your next appointment:   Virtual Visit   Provider:   Micah Flesher, PA-C    Then, [name], MD will plan to see you again in 1 year(s).    Other Instructions Recommend [employer] Blood Pressure Cuff. Log Blood Pressure Daily. Blood Pressure Goal 130/90.  Important Information About Sugar

## 2022-01-23 NOTE — Progress Notes (Signed)
Cardiology Office Note:    Date:  01/23/2022   ID:  Robert Huang, DOB May 25, 1961, MRN [id number]  PCP:  [name], MD   [facility] Providers Cardiologist:  Kirk Ruths, MD Cardiology APP:  Ledora Bottcher, [place] { Referring MD: [name], MD   Chief Complaint  Patient presents with   Follow-up    Has not been seen since 2019, chest pain    History of Present Illness:    Robert Huang is a 61 y.o. male with a hx of hypertension, hyperlipidemia, DM, anxiety, hypothyroidism, and CAD.  He has a history of DES-LCx, DES-RCA in March 2010.  Nuclear stress test November 2013 was nonischemic.  ABIs 03/03/2019 showed no significant lower arterial disease.    He was last seen by Dr. Stanford Breed 05/21/2018 and was doing well at that time.  He reported dyspnea with extreme exertion that was relieved with rest and was not associated with chest pain.  He was recently hospitalized 10/22/2021 - 10/28/2021 with sepsis secondary to deep multiple abscesses.  He was admitted and underwent debridement of large necrotic abscess of left transmetatarsal amputation on 10/25/2021 followed by left lower extremity transtibial amputation on 10/27/2021.   He no longer lives in [place] and requested a routine a follow-up appointment.  He has not been seen in greater than 3 years and this is considered a new patient visit.  He presents in a wheelchair. He reports chest pain with radiation down his left arm. CP occurs when he is sleep deprived, he has chest pain. CP occurs with active days or when he is out of his environment with increased activity. CP occurred 2-3 times over the past month. He climbs stairs backwards every day in his home without chest pain. CP lasts only seconds, like  "blip." No nausea, vomiting, or diaphoresis. Does not sound exertional.    Past Medical History:  Diagnosis Date   Anxiety    CAD (coronary artery disease)    sees Dr. Stanford Breed, normal Stress test 07-16-12     Diabetes mellitus    DJD (degenerative joint disease)    Gout    Hyperlipidemia    Hypertension    Hypothyroid    Osteoarthritis    Renal calculus    hx   Umbilical hernia     Past Surgical History:  Procedure Laterality Date   AMPUTATION  07/17/2012   Procedure: AMPUTATION RAY;  Surgeon: Wylene Simmer, MD;  Location: [place];  Service: Orthopedics;  Laterality: Right;  right hallux amputation (1st ray resection )   AMPUTATION Left 03/06/2019   Procedure: Ray Amputation left foot;  Surgeon: Nicholes Stairs, MD;  Location[facility];  Service: Orthopedics;  Laterality: Left;   AMPUTATION Left 10/27/2021   Procedure: LEFT BELOW KNEE AMPUTATION;  Surgeon: Newt Minion, MD;  Location: [place];  Service: Orthopedics;  Laterality: Left;   CORONARY ANGIOPLASTY WITH STENT PLACEMENT     FINGER SURGERY     left index finger   I & D EXTREMITY  05/19/2012   Procedure: IRRIGATION AND DEBRIDEMENT EXTREMITY;  Surgeon: Marin Shutter, MD;  Location: [place];  Service: Orthopedics;  Laterality: Right;  Right Great toe   I & D EXTREMITY Left 10/25/2021   Procedure: LEFT FOOT [place];  Surgeon: Newt Minion, MD;  Location: [place];  Service: Orthopedics;  Laterality: Left;   I & D Toe  05/19/2012   rt great toe   TONSILLECTOMY  TRANSMETATARSAL AMPUTATION Left 12/10/2020   Procedure: TRANSMETATARSAL AMPUTATION LEFT;  Surgeon: [name], MD;  Location: [facility];  Service: Orthopedics;  Laterality: Left;    Current Medications: Current Meds  Medication Sig   ALPRAZolam (XANAX) 1 MG tablet TAKE 1 TABLET BY MOUTH 3 TIMES DAILY AS NEEDED FOR ANXIETY.   amLODipine (NORVASC) 10 MG tablet Take 1 tablet (10 mg total) by mouth daily.   Aspirin-Acetaminophen-Caffeine 500-325-65 MG PACK Take 1 Package by mouth daily as needed (pain).   cloNIDine (CATAPRES) 0.3 MG tablet Take 1 tablet (0.3 mg total) by mouth 2 (two) times daily.   ezetimibe (ZETIA) 10 MG tablet TAKE 1 TABLET BY MOUTH EVERY DAY    fenofibrate 160 MG tablet Take 1 tablet (160 mg total) by mouth daily.   ferrous sulfate 325 (65 FE) MG EC tablet Take 1 tablet (325 mg total) by mouth 2 (two) times daily.   hydrALAZINE (APRESOLINE) 10 MG tablet Take 1 tablet (10 mg total) by mouth in the morning and at bedtime.   levothyroxine (SYNTHROID) 112 MCG tablet TAKE 1 TABLET BY MOUTH EVERY DAY (Patient taking differently: Take 112 mcg by mouth daily before breakfast.)   lisinopril-hydrochlorothiazide (ZESTORETIC) 20-12.5 MG tablet TAKE 1 TABLET BY MOUTH 2 (TWO) TIMES DAILY. 10AM AND 5PM   metFORMIN (GLUCOPHAGE) 1000 MG tablet TAKE 1 TABLET BY MOUTH TWO TIMES A DAY WITH A MEAL (Patient taking differently: Take 1,000 mg by mouth 2 (two) times daily with a meal.)   metoprolol tartrate (LOPRESSOR) 50 MG tablet Take 1 tablet (50 mg total) by mouth 2 (two) times daily.   mupirocin ointment (BACTROBAN) 2 % Apply 1 application. topically 2 (two) times daily.   omeprazole (PRILOSEC) 40 MG capsule TAKE 1 CAPSULE (40 MG TOTAL) BY MOUTH DAILY. (Patient taking differently: Take 40 mg by mouth daily.)   ONETOUCH ULTRA test strip 1 EACH BY OTHER ROUTE DAILY. USE AS INSTRUCTED   rosuvastatin (CRESTOR) 40 MG tablet TAKE 1 TABLET BY MOUTH EVERY DAY   Syringe/Needle, Disp, (SYRINGE 3CC/25GX1") 25G X 1" 3 ML MISC 1 application by Does not apply route once a week.   [DISCONTINUED] metoprolol succinate (TOPROL-XL) 100 MG 24 hr tablet TAKE 1 TABLET BY MOUTH TWICE A DAY     Allergies:   Patient has no known allergies.   Social History   Socioeconomic History   Marital status: Married    Spouse name: Not on file   Number of children: Not on file   Years of education: Not on file   Highest education level: Not on file  Occupational History   Not on file  Tobacco Use   Smoking status: Never   Smokeless tobacco: Current    Types: Chew   Tobacco comments:    occ  Vaping Use   Vaping Use: Never used  Substance and Sexual Activity   Alcohol use: Yes     Alcohol/week: 0.0 standard drinks    Comment: weekends   Drug use: No   Sexual activity: Yes  Other Topics Concern   Not on file  Social History Narrative   Not on file   Social Determinants of Health   Financial Resource Strain: Not on file  Food Insecurity: Not on file  Transportation Needs: Not on file  Physical Activity: Not on file  Stress: Not on file  Social Connections: Not on file     Family History: The patient's family history includes Arthritis in an other family member; Coronary artery disease  in an other family member; Diabetes in an other family member; Heart attack in his mother; Hypertension in an other family member; Prostate cancer in an other family member; Stroke in an other family member.  ROS:   Please see the history of present illness.     All other systems reviewed and are negative.  EKGs/Labs/Other Studies Reviewed:    The following studies were reviewed today:  Nuclear stress test 2013: nonischemic   EKG:  EKG is  ordered today.  The ekg ordered today demonstrates sinus bradycardia HR 48 with first degree heart block  Recent Labs: 10/23/2021: ALT 10 10/26/2021: BUN 16; Creatinine, Ser 1.16; Magnesium 1.6; Potassium 4.2; Sodium 138 10/28/2021: Hemoglobin 9.2; Platelets 296  Recent Lipid Panel    Component Value Date/Time   CHOL 170 09/28/2020 0924   CHOL 187 05/26/2018 1606   TRIG 305.0 (H) 09/28/2020 0924   HDL 42.00 09/28/2020 0924   HDL 38 (L) 05/26/2018 1606   CHOLHDL 4 09/28/2020 0924   VLDL 61.0 (H) 09/28/2020 0924   LDLCALC 61 10/03/2018 1304   LDLCALC 91 05/26/2018 1606   LDLDIRECT 88.0 09/28/2020 0924     Risk Assessment/Calculations:           Physical Exam:    VS:  BP 140/66 (BP Location: Left Arm, Patient Position: Sitting, Cuff Size: Large)   Pulse (!) 48   Ht 6' (1.829 m)   Wt 200 lb 9.6 oz (91 kg)   SpO2 97%   BMI 27.21 kg/m     Wt Readings from Last 3 Encounters:  01/23/22 200 lb 9.6 oz (91 kg)   12/05/21 195 lb 2 oz (88.5 kg)  11/07/21 192 lb (87.1 kg)     GEN:  Well nourished, well developed in no acute distress - in a wheelchair s/p left BKA HEENT: Normal NECK: No JVD; No carotid bruits LYMPHATICS: No lymphadenopathy CARDIAC: RRR, no murmurs, rubs, gallops RESPIRATORY:  Clear to auscultation without rales, wheezing or rhonchi  ABDOMEN: Soft, non-tender, non-distended MUSCULOSKELETAL:  No edema; No deformity  SKIN: Warm and dry NEUROLOGIC:  Alert and oriented x 3 PSYCHIATRIC:  Normal affect   ASSESSMENT:    1. Chest pain of uncertain etiology   2. Atherosclerosis of native coronary artery of native heart without angina pectoris   3. Sinus bradycardia   4. First degree heart block   5. Essential hypertension   6. Hyperlipidemia LDL goal <70   7. Abscess of left foot   8. CKD (chronic kidney disease) stage 2, GFR 60-89 ml/min    PLAN:    In order of problems listed above:  Chest pain Sounds atypical: Is not exertional, last seconds, and is associated with sleep deprivation Given his known disease, we did discuss nuclear stress testing.  He would like to make medication changes first to see if his chest pain resolves.  I agree with this.   Sinus bradycardia Heart rate today in the 40s, no syncope.  He wonders if his high dose of Toprol was contributing to his bradycardia, chest pain, and fatigue.  I will reduce Toprol to 50 mg twice daily.  He has been on this medication for a number of years and may need a slower taper.  We discussed possibly needing 75 mg twice daily.  He will evaluate withdrawal symptoms and let me know.   CAD DES to RCA, DES to LCX 2010 Continue aspirin, beta-blocker, statin Reduce beta-blocker as above     Hypertension Maintained on 0.3  mg clonidine 2 times daily, 10 mg amlodipine, lisinopril-HCTZ 20-12.5 mg, 100 mg Toprol BID Will reduce Toprol to 50 mg twice daily.  He may need to add 10 mg hydralazine twice daily but will keep a blood  pressure log.  I will see him in a telemetry medicine follow-up in 1 month to evaluate blood pressure trends.     Hyperlipidemia with LDL goal less than 55 Maintained on fenofibrate, Zetia, 40 mg rosuvastatin Repeat fasting lipids today Would prefer a lower LDL goal given his diabetes   Status post recent left amputation He is in a wheelchair and his surgical site is healing although slowly.  He works as a Programmer, applications person and questions whether or not he will return to work given his age.   Follow-up on my schedule in 1 month for telemedicine visit.   Medication Adjustments/Labs and Tests Ordered: Current medicines are reviewed at length with the patient today.  Concerns regarding medicines are outlined above.  Orders Placed This Encounter  Procedures   Comprehensive metabolic panel   Lipid panel   EKG 12-Lead   Meds ordered this encounter  Medications   nitroGLYCERIN (NITROSTAT) 0.4 MG SL tablet    Sig: Place 1 tablet (0.4 mg total) under the tongue every 5 (five) minutes as needed. For chest pain.    Dispense:  25 tablet    Refill:  5   metoprolol tartrate (LOPRESSOR) 50 MG tablet    Sig: Take 1 tablet (50 mg total) by mouth 2 (two) times daily.    Dispense:  180 tablet    Refill:  3   hydrALAZINE (APRESOLINE) 10 MG tablet    Sig: Take 1 tablet (10 mg total) by mouth in the morning and at bedtime.    Dispense:  60 tablet    Refill:  3    Patient Instructions  Medication Instructions:  Decrease Metoprolol Tartrate to 50 mg ( Take 1 Tablet Twice Daily). Start Hydralazine 10 mg ( Take 1 tablet Twice Daily). *If you need a refill on your cardiac medications before your next appointment, please call your pharmacy*   Lab Work: Lipid Panel, CMET. Today If you have labs (blood work) drawn today and your tests are completely normal, you will receive your results only by: [place] (if you have MyChart) OR A paper copy in the mail If you have any lab test that is  abnormal or we need to change your treatment, we will call you to review the results.   Testing/Procedures: No Testing   Follow-Up: At [facility], you and your health needs are our priority.  As part of our continuing mission to provide you with exceptional heart care, we have created designated Provider Care Teams.  These Care Teams include your primary Cardiologist (physician) and Advanced Practice Providers (APPs -  Physician Assistants and Nurse Practitioners) who all work together to provide you with the care you need, when you need it.  We recommend signing up for the patient portal called "MyChart".  Sign up information is provided on this After Visit Summary.  MyChart is used to connect with patients for Virtual Visits (Telemedicine).  Patients are able to view lab/test results, encounter notes, upcoming appointments, etc.  Non-urgent messages can be sent to your provider as well.   To learn more about what you can do with MyChart, go to [email or web address].    Your next appointment:   1 month(s)  The format for your next appointment:   Virtual Visit  Provider:   Fabian Sharp, PA-C    Then, Kirk Ruths, MD will plan to see you again in 1 year(s).    Other Instructions Recommend [employer] Blood Pressure Cuff. Log Blood Pressure Daily. Blood Pressure Goal 130/90.  Important Information About Sugar         Signed, Ledora Bottcher, [place]  01/23/2022 12:16 PM    [facility]

## 2022-01-24 LAB — LIPID PANEL
Chol/HDL Ratio: 3.5 ratio (ref 0.0–5.0)
Cholesterol, Total: 166 mg/dL (ref 100–199)
HDL: 47 mg/dL (ref >39)
LDL Chol Calc (NIH): 90 mg/dL (ref 0–99)
Triglycerides: 168 mg/dL — ABNORMAL HIGH (ref 0–149)
VLDL Cholesterol Cal: 29 mg/dL (ref 5–40)

## 2022-01-24 LAB — COMPREHENSIVE METABOLIC PANEL
ALT: 15 IU/L (ref 0–44)
AST: 18 IU/L (ref 0–40)
Albumin/Globulin Ratio: 1.7 (ref 1.2–2.2)
Albumin: 4.1 g/dL (ref 3.8–4.8)
Alkaline Phosphatase: 71 IU/L (ref 44–121)
BUN/Creatinine Ratio: 19 (ref 10–24)
BUN: 19 mg/dL (ref 8–27)
Bilirubin Total: <0.2 mg/dL (ref 0.0–1.2)
CO2: 25 mmol/L (ref 20–29)
Calcium: 9.7 mg/dL (ref 8.6–10.2)
Chloride: 102 mmol/L (ref 96–106)
Creatinine, Ser: 0.99 mg/dL (ref 0.76–1.27)
Globulin, Total: 2.4 g/dL (ref 1.5–4.5)
Glucose: 137 mg/dL — ABNORMAL HIGH (ref 70–99)
Potassium: 4.7 mmol/L (ref 3.5–5.2)
Sodium: 140 mmol/L (ref 134–144)
Total Protein: 6.5 g/dL (ref 6.0–8.5)
eGFR: 86 mL/min/{1.73_m2} (ref >59)

## 2022-01-31 ENCOUNTER — Other Ambulatory Visit: Payer: Self-pay | Admitting: Orthopedic Surgery

## 2022-02-01 ENCOUNTER — Other Ambulatory Visit: Payer: Self-pay | Admitting: *Deleted

## 2022-02-01 DIAGNOSIS — E785 Hyperlipidemia, unspecified: Secondary | ICD-10-CM

## 2022-02-02 ENCOUNTER — Other Ambulatory Visit: Payer: Self-pay | Admitting: Family Medicine

## 2022-02-02 DIAGNOSIS — I10 Essential (primary) hypertension: Secondary | ICD-10-CM

## 2022-02-02 DIAGNOSIS — E785 Hyperlipidemia, unspecified: Secondary | ICD-10-CM

## 2022-02-04 ENCOUNTER — Encounter: Payer: Self-pay | Admitting: Orthopedic Surgery

## 2022-02-04 NOTE — Progress Notes (Signed)
Office Visit Note   Patient: Robert Huang [id number]           Date of Birth: [birth year]           MRN: [id number] Visit Date: 01/02/2022              Requested by: [name], MD [place],  [place] [id number] PCP: [name], MD  Chief Complaint  Patient presents with   Left Leg - Routine Post Op    10/27/2021 left BKA       HPI: Patient is a 61 year old gentleman who is 2 months status post left below-knee amputation patient is currently working on prosthetic fitting patient has a few small ulcers.  Assessment & Plan: Visit Diagnoses:  1. Below-knee amputation of left lower extremity ([place])     Plan: Patient was provided a prescription for doxycycline.  Patient was also provided a prescription for a cane for ultralight standard wheelchair 16 x 16.  Follow-Up Instructions: Return in about 4 weeks (around 01/30/2022).   Ortho Exam  Patient is alert, oriented, no adenopathy, well-dressed, normal affect, normal respiratory effort. Patient has completed a course of doxycycline but thinks he still has residual infection.  There are 2 small skin tears there is still some edema there is no cellulitis there was some healthy granulation tissue with 2 wounds 1 x 2 cm and 1.5 x 2 cm.  No drainage no cellulitis no signs of infection.  Imaging: No results found.     Labs: Lab Results  Component Value Date   HGBA1C 7.2 (H) 10/24/2021   HGBA1C 6.6 (H) 12/08/2020   HGBA1C 7.5 (H) 09/28/2020   ESRSEDRATE >140 (H) 10/22/2021   ESRSEDRATE 50 (H) 03/02/2019   ESRSEDRATE 63 (H) 07/17/2012   CRP 20.1 (H) 10/22/2021   CRP 2.9 (H) 03/02/2019   CRP 1.3 (H) 07/17/2012   LABURIC 7.2 12/03/2017   LABURIC 7.6 01/11/2017   LABURIC 12.7 (H) 11/05/2014   REPTSTATUS 11/02/2021 FINAL 10/25/2021   GRAMSTAIN  10/25/2021    RARE WBC PRESENT, PREDOMINANTLY MONONUCLEAR RARE GRAM POSITIVE COCCI IN PAIRS    CULT  10/25/2021    FEW PROTEUS MIRABILIS RARE ENTEROCOCCUS  FAECALIS RARE BACTEROIDES FRAGILIS BETA LACTAMASE POSITIVE Performed at [facility], [place] [address], [place], [place] [address]    [place] 10/25/2021   LABORGA ENTEROCOCCUS FAECALIS 10/25/2021     Lab Results  Component Value Date   ALBUMIN 4.1 01/23/2022   ALBUMIN 2.1 (L) 10/23/2021   ALBUMIN 3.0 (L) 12/08/2020    Lab Results  Component Value Date   MG 1.6 (L) 10/26/2021   MG 1.5 (L) 10/25/2021   MG 1.6 (L) 10/24/2021   No results found for: "VD25OH"  No results found for: "PREALBUMIN"    Latest Ref Rng & Units 10/28/2021    1:35 AM 10/26/2021    3:41 AM 10/24/2021    4:35 AM  CBC EXTENDED  WBC 4.0 - 10.5 K/uL 9.6  10.1  8.6   RBC 4.22 - 5.81 MIL/uL 3.20  3.22  3.55   Hemoglobin 13.0 - 17.0 g/dL 9.2  9.2  10.0   HCT 39.0 - 52.0 % 28.8  29.2  31.7   Platelets 150 - 400 K/uL 296  294  265   NEUT# 1.7 - 7.7 K/uL  8.8  6.2   Lymph# 0.7 - 4.0 K/uL  0.5  1.0      There is no height or  weight on file to calculate BMI.  Orders:  No orders of the defined types were placed in this encounter.  Meds ordered this encounter  Medications   doxycycline (VIBRA-TABS) 100 MG tablet    Sig: Take 1 tablet (100 mg total) by mouth 2 (two) times daily.    Dispense:  60 tablet    Refill:  0     Procedures: No procedures performed  Clinical Data: No additional findings.  ROS:  All other systems negative, except as noted in the HPI. Review of Systems  Objective: Vital Signs: There were no vitals taken for this visit.  Specialty Comments:  No specialty comments available.  PMFS History: Patient Active Problem List   Diagnosis Date Noted   Abscess of left foot    History of transmetatarsal amputation of left foot (HCC)    Severe protein-calorie malnutrition ([place])    CKD (chronic kidney disease) stage 2, GFR 60-89 ml/min 10/22/2021   Hyponatremia 10/22/2021   Chronic foot pain 08/29/2021   Iron deficiency anemia 12/19/2020   Cellulitis of left  foot    History of complete ray amputation of fifth toe of left foot ([place])    Status post amputation of right great toe (HCC)    Diabetic foot infection ([place]) 12/08/2020   Diabetic foot ulcers ([place]) 03/02/2019   Osteomyelitis of fourth toe of left foot ([place]) 03/02/2019   Hypokalemia 03/02/2019   Chronic right shoulder pain 12/02/2018   Diabetes mellitus with complication ([place]) [id number]   Right hip pain 12/27/2015   MRSA (methicillin resistant staph aureus) culture positive 12/31/2012   Open displaced fracture of right great toe 05/20/2012   Anxiety state 12/25/2008   Coronary atherosclerosis 12/25/2008   CHEST PAIN 12/25/2008   Hypothyroidism 05/27/2007   Dyslipidemia 05/27/2007   Gout 05/27/2007   Essential hypertension 05/27/2007   Osteoarthrosis, unspecified whether generalized or localized, unspecified site 05/27/2007   RENAL CALCULUS, HX OF 05/27/2007   Past Medical History:  Diagnosis Date   Anxiety    CAD (coronary artery disease)    sees Dr. Stanford Breed, normal Stress test 07-16-12    Diabetes mellitus    DJD (degenerative joint disease)    Gout    Hyperlipidemia    Hypertension    Hypothyroid    Osteoarthritis    Renal calculus    hx   Umbilical hernia     Family History  Problem Relation Age of Onset   Heart attack Mother    Arthritis Other    Coronary artery disease Other    Diabetes Other    Hypertension Other    Prostate cancer Other    Stroke Other     Past Surgical History:  Procedure Laterality Date   AMPUTATION  07/17/2012   Procedure: AMPUTATION RAY;  Surgeon: Wylene Simmer, MD;  Location: [place];  Service: Orthopedics;  Laterality: Right;  right hallux amputation (1st ray resection )   AMPUTATION Left 03/06/2019   Procedure: Ray Amputation left foot;  Surgeon: Nicholes Stairs, MD;  [facility];  Service: Orthopedics;  Laterality: Left;   AMPUTATION Left 10/27/2021   Procedure: LEFT BELOW KNEE AMPUTATION;  Surgeon: Newt Minion, MD;   Location: [place];  Service: Orthopedics;  Laterality: Left;   CORONARY ANGIOPLASTY WITH STENT PLACEMENT     FINGER SURGERY     left index finger   I & D EXTREMITY  05/19/2012   Procedure: IRRIGATION AND DEBRIDEMENT EXTREMITY;  Surgeon: Marin Shutter, MD;  Location: [facility];  Service: Orthopedics;  Laterality: Right;  Right Great toe   I & D EXTREMITY Left 10/25/2021   Procedure: LEFT FOOT [place];  Surgeon: Newt Minion, MD;  Location: [place];  Service: Orthopedics;  Laterality: Left;   I & D Toe  05/19/2012   rt great toe   TONSILLECTOMY     TRANSMETATARSAL AMPUTATION Left 12/10/2020   Procedure: TRANSMETATARSAL AMPUTATION LEFT;  Surgeon: Newt Minion, MD;  Location: [place];  Service: Orthopedics;  Laterality: Left;   Social History   Occupational History   Not on file  Tobacco Use   Smoking status: Never   Smokeless tobacco: Current    Types: Chew   Tobacco comments:    occ  Vaping Use   Vaping Use: Never used  Substance and Sexual Activity   Alcohol use: Yes    Alcohol/week: 0.0 standard drinks of alcohol    Comment: weekends   Drug use: No   Sexual activity: Yes

## 2022-02-05 ENCOUNTER — Telehealth: Payer: Self-pay | Admitting: Orthopedic Surgery

## 2022-02-05 ENCOUNTER — Ambulatory Visit (INDEPENDENT_AMBULATORY_CARE_PROVIDER_SITE_OTHER): Payer: No Typology Code available for payment source | Admitting: Orthopedic Surgery

## 2022-02-05 DIAGNOSIS — S88112A Complete traumatic amputation at level between knee and ankle, left lower leg, initial encounter: Secondary | ICD-10-CM

## 2022-02-05 DIAGNOSIS — Z89512 Acquired absence of left leg below knee: Secondary | ICD-10-CM

## 2022-02-05 NOTE — Telephone Encounter (Signed)
Faxed and mailed physician statement to patient per his request

## 2022-02-06 ENCOUNTER — Telehealth: Payer: Self-pay | Admitting: Orthopedic Surgery

## 2022-02-06 NOTE — Telephone Encounter (Signed)
[place] forms received. To [employer]. ?

## 2022-02-13 ENCOUNTER — Encounter: Payer: Self-pay | Admitting: Orthopedic Surgery

## 2022-02-13 NOTE — Progress Notes (Signed)
Office Visit Note   Patient: Robert Huang           Date of Birth: 1961-02-18           MRN: [id number] Visit Date: 02/05/2022              Requested by: [name], MD [place],  [place] [address] PCP: [name], MD  Chief Complaint  Patient presents with   Left Leg - Routine Post Op    10/27/2021 left BKA       HPI: Patient is a 61 year old gentleman who is over 3 months status post left transtibial amputation.  Patient is returning to work Architectural technologist.  Patient is currently going to [facility] for prosthetic fitting.  Assessment & Plan: Visit Diagnoses:  1. Below-knee amputation of left lower extremity ([place])     Plan: Continue with the stump shrinker he was given a note to return to work on 613 for seated work.  Patient states he will obtain his prosthesis in 2 weeks.  Follow-Up Instructions: Return in about 2 months (around 04/07/2022).   Ortho Exam  Patient is alert, oriented, no adenopathy, well-dressed, normal affect, normal respiratory effort. Examination there is good consolidation of the residual limb there are 2 small open wounds laterally with healthy granulation tissue.  Imaging: No results found.   Labs: Lab Results  Component Value Date   HGBA1C 7.2 (H) 10/24/2021   HGBA1C 6.6 (H) 12/08/2020   HGBA1C 7.5 (H) 09/28/2020   ESRSEDRATE >140 (H) 10/22/2021   ESRSEDRATE 50 (H) 03/02/2019   ESRSEDRATE 63 (H) 07/17/2012   CRP 20.1 (H) 10/22/2021   CRP 2.9 (H) 03/02/2019   CRP 1.3 (H) 07/17/2012   LABURIC 7.2 12/03/2017   LABURIC 7.6 01/11/2017   LABURIC 12.7 (H) 11/05/2014   REPTSTATUS 11/02/2021 FINAL 10/25/2021   GRAMSTAIN  10/25/2021    RARE WBC PRESENT, PREDOMINANTLY MONONUCLEAR RARE GRAM POSITIVE COCCI IN PAIRS    CULT  10/25/2021    FEW PROTEUS MIRABILIS RARE ENTEROCOCCUS FAECALIS RARE BACTEROIDES FRAGILIS BETA LACTAMASE POSITIVE Performed at [facility], [place] [address],  [place], [place] [address]    [place] 10/25/2021   LABORGA ENTEROCOCCUS FAECALIS 10/25/2021     Lab Results  Component Value Date   ALBUMIN 4.1 01/23/2022   ALBUMIN 2.1 (L) 10/23/2021   ALBUMIN 3.0 (L) 12/08/2020    Lab Results  Component Value Date   MG 1.6 (L) 10/26/2021   MG 1.5 (L) 10/25/2021   MG 1.6 (L) 10/24/2021   No results found for: "VD25OH"  No results found for: "PREALBUMIN"    Latest Ref Rng & Units 10/28/2021    1:35 AM 10/26/2021    3:41 AM 10/24/2021    4:35 AM  CBC EXTENDED  WBC 4.0 - 10.5 K/uL 9.6  10.1  8.6   RBC 4.22 - 5.81 MIL/uL 3.20  3.22  3.55   Hemoglobin 13.0 - 17.0 g/dL 9.2  9.2  10.0   HCT 39.0 - 52.0 % 28.8  29.2  31.7   Platelets 150 - 400 K/uL 296  294  265   NEUT# 1.7 - 7.7 K/uL  8.8  6.2   Lymph# 0.7 - 4.0 K/uL  0.5  1.0      There is no height or weight on file to calculate BMI.  Orders:  No orders of the defined types were placed in this encounter.  No orders of the  defined types were placed in this encounter.    Procedures: No procedures performed  Clinical Data: No additional findings.  ROS:  All other systems negative, except as noted in the HPI. Review of Systems  Objective: Vital Signs: There were no vitals taken for this visit.  Specialty Comments:  No specialty comments available.  PMFS History: Patient Active Problem List   Diagnosis Date Noted   Abscess of left foot    History of transmetatarsal amputation of left foot (HCC)    Severe protein-calorie malnutrition (HCC)    CKD (chronic kidney disease) stage 2, GFR 60-89 ml/min 10/22/2021   Hyponatremia 10/22/2021   Chronic foot pain 08/29/2021   Iron deficiency anemia 12/19/2020   Cellulitis of left foot    History of complete ray amputation of fifth toe of left foot (HCC)    Status post amputation of right great toe (HCC)    Diabetic foot infection (HCC) 12/08/2020   Diabetic foot ulcers (HCC) 03/02/2019   Osteomyelitis of fourth toe of  left foot (HCC) 03/02/2019   Hypokalemia 03/02/2019   Chronic right shoulder pain 12/02/2018   Diabetes mellitus with complication (HCC) 12/03/2017   Right hip pain 12/27/2015   MRSA (methicillin resistant staph aureus) culture positive 12/31/2012   Open displaced fracture of right great toe 05/20/2012   Anxiety state 12/25/2008   Coronary atherosclerosis 12/25/2008   CHEST PAIN 12/25/2008   Hypothyroidism 05/27/2007   Dyslipidemia 05/27/2007   Gout 05/27/2007   Essential hypertension 05/27/2007   Osteoarthrosis, unspecified whether generalized or localized, unspecified site 05/27/2007   RENAL CALCULUS, HX OF 05/27/2007   Past Medical History:  Diagnosis Date   Anxiety    CAD (coronary artery disease)    sees Dr. [name], normal Stress test 07-16-12    Diabetes mellitus    DJD (degenerative joint disease)    Gout    Hyperlipidemia    Hypertension    Hypothyroid    Osteoarthritis    Renal calculus    hx   Umbilical hernia     Family History  Problem Relation Age of Onset   Heart attack Mother    Arthritis Other    Coronary artery disease Other    Diabetes Other    Hypertension Other    Prostate cancer Other    Stroke Other     Past Surgical History:  Procedure Laterality Date   AMPUTATION  07/17/2012   Procedure: AMPUTATION RAY;  Surgeon: [name], MD;  Location: [facility] OR;  Service: Orthopedics;  Laterality: Right;  right hallux amputation (1st ray resection )   AMPUTATION Left 03/06/2019   Procedure: Ray Amputation left foot;  Surgeon: [name], MD;  [facility]: [facility];  Service: Orthopedics;  Laterality: Left;   AMPUTATION Left 10/27/2021   Procedure: LEFT BELOW KNEE AMPUTATION;  Surgeon: [name], MD;  Location: [facility] OR;  Service: Orthopedics;  Laterality: Left;   CORONARY ANGIOPLASTY WITH STENT PLACEMENT     FINGER SURGERY     left index finger   I & D EXTREMITY  05/19/2012   Procedure: IRRIGATION AND DEBRIDEMENT EXTREMITY;  Surgeon: [name], MD;  Location: [facility] OR;  Service: Orthopedics;  Laterality: Right;  Right Great toe   I & D EXTREMITY Left 10/25/2021   Procedure: LEFT FOOT DEDRIDEMENT;  Surgeon: [name], MD;  Location: [facility] OR;  Service: Orthopedics;  Laterality: Left;   I & D Toe  05/19/2012   rt great toe   TONSILLECTOMY  TRANSMETATARSAL AMPUTATION Left 12/10/2020   Procedure: TRANSMETATARSAL AMPUTATION LEFT;  Surgeon: [name], MD;  Location: [facility] OR;  Service: Orthopedics;  Laterality: Left;   Social History   Occupational History   Not on file  Tobacco Use   Smoking status: Never   Smokeless tobacco: Current    Types: Chew   Tobacco comments:    occ  Vaping Use   Vaping Use: Never used  Substance and Sexual Activity   Alcohol use: Yes    Alcohol/week: 0.0 standard drinks of alcohol    Comment: weekends   Drug use: No   Sexual activity: Yes

## 2022-02-22 ENCOUNTER — Ambulatory Visit: Payer: No Typology Code available for payment source | Admitting: Physician Assistant

## 2022-02-28 ENCOUNTER — Encounter: Payer: Self-pay | Admitting: Internal Medicine

## 2022-02-28 ENCOUNTER — Other Ambulatory Visit: Payer: Self-pay | Admitting: Family Medicine

## 2022-03-06 ENCOUNTER — Encounter: Payer: Self-pay | Admitting: Family Medicine

## 2022-03-06 ENCOUNTER — Telehealth: Payer: No Typology Code available for payment source | Admitting: Family Medicine

## 2022-03-07 ENCOUNTER — Encounter: Payer: Self-pay | Admitting: Family Medicine

## 2022-03-07 ENCOUNTER — Telehealth (INDEPENDENT_AMBULATORY_CARE_PROVIDER_SITE_OTHER): Payer: No Typology Code available for payment source | Admitting: Family Medicine

## 2022-03-07 DIAGNOSIS — F1190 Opioid use, unspecified, uncomplicated: Secondary | ICD-10-CM

## 2022-03-07 DIAGNOSIS — G546 Phantom limb syndrome with pain: Secondary | ICD-10-CM

## 2022-03-07 MED ORDER — HYDROCODONE-ACETAMINOPHEN 5-325 MG PO TABS: 1.0000 | ORAL_TABLET | Freq: Four times a day (QID) | ORAL | 0 refills | Status: DC | PRN

## 2022-03-07 MED ORDER — HYDROCODONE-ACETAMINOPHEN 5-325 MG PO TABS: 1.0000 | ORAL_TABLET | Freq: Four times a day (QID) | ORAL | 0 refills | Status: AC | PRN

## 2022-03-07 NOTE — Progress Notes (Signed)
Subjective:    Patient ID: Robert Huang [id number], male    DOB: 11-05-1960, 61 y.o.   MRN: [id number]  HPI Virtual Visit via Video Note  I connected with the patient on 03/07/22 at 10:45 AM EDT by a video enabled telemedicine application and verified that I am speaking with the correct person using two identifiers.  Location patient: home Location provider:work or home office Persons participating in the virtual visit: patient, provider  I discussed the limitations of evaluation and management by telemedicine and the availability of in person appointments. The patient expressed understanding and agreed to proceed.   HPI: Here for pain management. His pain has been improving a bit. He has still been unable to get fitted for a prosthesis however because the wound has not completely healed. He is working full time from home.   ROS: See pertinent positives and negatives per HPI.  Past Medical History:  Diagnosis Date   Anxiety    CAD (coronary artery disease)    sees Dr. [name], normal Stress test 07-16-12    Diabetes mellitus    DJD (degenerative joint disease)    Gout    Hyperlipidemia    Hypertension    Hypothyroid    Osteoarthritis    Renal calculus    hx   Umbilical hernia     Past Surgical History:  Procedure Laterality Date   AMPUTATION  07/17/2012   Procedure: AMPUTATION RAY;  Surgeon: [name], MD;  Location: [facility] OR;  Service: Orthopedics;  Laterality: Right;  right hallux amputation (1st ray resection )   AMPUTATION Left 03/06/2019   Procedure: Ray Amputation left foot;  Surgeon: [name], MD;  [facility];  Service: Orthopedics;  Laterality: Left;   AMPUTATION Left 10/27/2021   Procedure: LEFT BELOW KNEE AMPUTATION;  Surgeon: [name], MD;  Location: [facility] OR;  Service: Orthopedics;  Laterality: Left;   CORONARY ANGIOPLASTY WITH STENT PLACEMENT     FINGER SURGERY     left index finger   I & D EXTREMITY  05/19/2012   Procedure: IRRIGATION  AND DEBRIDEMENT EXTREMITY;  Surgeon: [name], MD;  [facility] OR;  Service: Orthopedics;  Laterality: Right;  Right Great toe   I & D EXTREMITY Left 10/25/2021   Procedure: LEFT FOOT DEDRIDEMENT;  Surgeon: [name], MD;  Location: [facility] OR;  Service: Orthopedics;  Laterality: Left;   I & D Toe  05/19/2012   rt great toe   TONSILLECTOMY     TRANSMETATARSAL AMPUTATION Left 12/10/2020   Procedure: TRANSMETATARSAL AMPUTATION LEFT;  Surgeon: [name], MD;  Location: [facility] OR;  Service: Orthopedics;  Laterality: Left;    Family History  Problem Relation Age of Onset   Heart attack Mother    Arthritis Other    Coronary artery disease Other    Diabetes Other    Hypertension Other    Prostate cancer Other    Stroke Other      Current Outpatient Medications:    acetaminophen (TYLENOL) 325 MG tablet, Take 2 tablets (650 mg total) by mouth every 6 (six) hours as needed for mild pain (or Fever >/= 101)., Disp:  , Rfl:    ALPRAZolam (XANAX) 1 MG tablet, TAKE 1 TABLET BY MOUTH 3 TIMES DAILY AS NEEDED FOR ANXIETY., Disp: 270 tablet, Rfl: 1   amLODipine (NORVASC) 10 MG tablet, TAKE 1 TABLET BY MOUTH EVERY DAY, Disp: 90 tablet, Rfl: 0   Aspirin-Acetaminophen-Caffeine 500-325-65 MG PACK, Take 1 Package  by mouth daily as needed (pain)., Disp: , Rfl:    cloNIDine (CATAPRES) 0.3 MG tablet, TAKE 1 TABLET BY MOUTH 2 TIMES DAILY., Disp: 180 tablet, Rfl: 0   colchicine 0.6 MG tablet, TAKE 1 TABLET (0.6 MG TOTAL) BY MOUTH EVERY 6 (SIX) HOURS AS NEEDED (GOUT)., Disp: 360 tablet, Rfl: 0   cyanocobalamin (,VITAMIN B-12,) 1000 MCG/ML injection, INJECT 1 ML (1,000 MCG TOTAL) INTO THE MUSCLE ONCE A WEEK., Disp: 12 mL, Rfl: 6   ezetimibe (ZETIA) 10 MG tablet, TAKE 1 TABLET BY MOUTH EVERY DAY, Disp: 90 tablet, Rfl: 1   fenofibrate 160 MG tablet, TAKE 1 TABLET BY MOUTH EVERY DAY, Disp: 90 tablet, Rfl: 0   fluticasone (FLONASE) 50 MCG/ACT nasal spray, Place 2 sprays into both nostrils daily., Disp: 16 g, Rfl:  6   glipiZIDE (GLUCOTROL) 5 MG tablet, TAKE 1 TABLET BY MOUTH TWICE A DAY, Disp: 180 tablet, Rfl: 1   hydrALAZINE (APRESOLINE) 10 MG tablet, Take 1 tablet (10 mg total) by mouth in the morning and at bedtime., Disp: 60 tablet, Rfl: 3   indomethacin (INDOCIN SR) 75 MG CR capsule, TAKE 1 CAPSULE BY MOUTH TWICE A DAY WITH A MEAL, Disp: 60 capsule, Rfl: 2   levothyroxine (SYNTHROID) 112 MCG tablet, TAKE 1 TABLET BY MOUTH EVERY DAY (Patient taking differently: Take 112 mcg by mouth daily before breakfast.), Disp: 90 tablet, Rfl: 1   lisinopril-hydrochlorothiazide (ZESTORETIC) 20-12.5 MG tablet, TAKE 1 TABLET BY MOUTH 2 (TWO) TIMES DAILY. 10AM AND 5PM, Disp: 180 tablet, Rfl: 0   metFORMIN (GLUCOPHAGE) 1000 MG tablet, TAKE 1 TABLET BY MOUTH TWO TIMES A DAY WITH A MEAL, Disp: 180 tablet, Rfl: 1   metoprolol tartrate (LOPRESSOR) 50 MG tablet, Take 1 tablet (50 mg total) by mouth 2 (two) times daily., Disp: 180 tablet, Rfl: 3   mupirocin ointment (BACTROBAN) 2 %, Apply 1 application. topically 2 (two) times daily., Disp: 30 g, Rfl: 5   nitroGLYCERIN (NITROSTAT) 0.4 MG SL tablet, Place 1 tablet (0.4 mg total) under the tongue every 5 (five) minutes as needed. For chest pain., Disp: 25 tablet, Rfl: 5   omeprazole (PRILOSEC) 40 MG capsule, TAKE 1 CAPSULE (40 MG TOTAL) BY MOUTH DAILY. (Patient taking differently: Take 40 mg by mouth daily.), Disp: 90 capsule, Rfl: 2   ONETOUCH ULTRA test strip, 1 EACH BY OTHER ROUTE DAILY. USE AS INSTRUCTED, Disp: 100 strip, Rfl: 1   rosuvastatin (CRESTOR) 40 MG tablet, TAKE 1 TABLET BY MOUTH EVERY DAY, Disp: 90 tablet, Rfl: 0   Syringe/Needle, Disp, (SYRINGE 3CC/25GX1") 25G X 1" 3 ML MISC, 1 application by Does not apply route once a week., Disp: 50 each, Rfl: 5   zolpidem (AMBIEN) 10 MG tablet, TAKE 1 TABLET BY MOUTH AT BEDTIME AS NEEDED FOR SLEEP, Disp: 90 tablet, Rfl: 1   ferrous sulfate 325 (65 FE) MG EC tablet, Take 1 tablet (325 mg total) by mouth 2 (two) times daily., Disp:  60 tablet, Rfl: 0  EXAM:  VITALS per patient if applicable:  GENERAL: alert, oriented, appears well and in no acute distress  HEENT: atraumatic, conjunttiva clear, no obvious abnormalities on inspection of external nose and ears  NECK: normal movements of the head and neck  LUNGS: on inspection no signs of respiratory distress, breathing rate appears normal, no obvious gross SOB, gasping or wheezing  CV: no obvious cyanosis  MS: moves all visible extremities without noticeable abnormality  PSYCH/NEURO: pleasant and cooperative, no obvious depression or anxiety, speech and  thought processing grossly intact  ASSESSMENT AND PLAN: Pain management.  Indication for chronic opioid: phantom limb pain Medication and dose: Norco 5-325 # pills per month: 120 Last UDS date: 12-05-21 Opioid Treatment Agreement signed (Y/N): 12-10-18 Opioid Treatment Agreement last reviewed with patient:  03-07-22 [facility] reviewed this encounter (include red flags): Yes We agreed to decrease the dose of hydrocodone from 10 mg to 5 mg. Gershon Crane, MD  Discussed the following assessment and plan:  No diagnosis found.     I discussed the assessment and treatment plan with the patient. The patient was provided an opportunity to ask questions and all were answered. The patient agreed with the plan and demonstrated an understanding of the instructions.   The patient was advised to call back or seek an in-person evaluation if the symptoms worsen or if the condition fails to improve as anticipated.      Review of Systems     Objective:   Physical Exam        Assessment & Plan:

## 2022-03-29 ENCOUNTER — Telehealth: Payer: Self-pay

## 2022-03-29 NOTE — Telephone Encounter (Signed)
Pt dropped off some forms for Dr Clent Ridges to complete, Per Dr Clent Ridges left detailed message on pt voicemail advising to call the office and schedule a Virtual appointment to discuss / complete  the form

## 2022-04-03 ENCOUNTER — Encounter: Payer: Self-pay | Admitting: Orthopedic Surgery

## 2022-04-03 ENCOUNTER — Ambulatory Visit (INDEPENDENT_AMBULATORY_CARE_PROVIDER_SITE_OTHER): Payer: No Typology Code available for payment source | Admitting: Orthopedic Surgery

## 2022-04-03 DIAGNOSIS — S88112A Complete traumatic amputation at level between knee and ankle, left lower leg, initial encounter: Secondary | ICD-10-CM

## 2022-04-03 DIAGNOSIS — Z89512 Acquired absence of left leg below knee: Secondary | ICD-10-CM

## 2022-04-03 NOTE — Progress Notes (Signed)
Office Visit Note   Patient: Robert Huang [id number]           Date of Birth: 11/20/1960           MRN: [id number] Visit Date: 04/03/2022              Requested by: [name], MD [place],  [place] [address] PCP: [name], MD  Chief Complaint  Patient presents with   Left Leg - Follow-up    10/27/21 Left BKA      HPI: Patient is a 61 year old gentleman who is 5 months status post left transtibial amputation.  Assessment & Plan: Visit Diagnoses:  1. Below-knee amputation of left lower extremity ([place])     Plan: Patient was provided a prescription for a prosthesis to be obtained at [facility].  Patient will also need to start gait training with therapist in [place].  He will call or follow-up if there is any concern.  Follow-Up Instructions: Return if symptoms worsen or fail to improve.   Ortho Exam  Patient is alert, oriented, no adenopathy, well-dressed, normal affect, normal respiratory effort. Examination the residual limb is well consolidated there is good hair growth there is no open ulcers no redness no cellulitis no drainage.  Patient is a new left transtibial  amputee.  Patient's current comorbidities are not expected to impact the ability to function with the prescribed prosthesis. Patient verbally communicates a strong desire to use a prosthesis. Patient currently requires mobility aids to ambulate without a prosthesis.  Expects not to use mobility aids with a new prosthesis.  Patient is a K3 level ambulator that spends a lot of time walking around on uneven terrain over obstacles, up and down stairs, and ambulates with a variable cadence.     Imaging: No results found.   Labs: Lab Results  Component Value Date   HGBA1C 7.2 (H) 10/24/2021   HGBA1C 6.6 (H) 12/08/2020   HGBA1C 7.5 (H) 09/28/2020   ESRSEDRATE >140 (H) 10/22/2021   ESRSEDRATE 50 (H) 03/02/2019   ESRSEDRATE 63 (H) 07/17/2012   CRP 20.1  (H) 10/22/2021   CRP 2.9 (H) 03/02/2019   CRP 1.3 (H) 07/17/2012   LABURIC 7.2 12/03/2017   LABURIC 7.6 01/11/2017   LABURIC 12.7 (H) 11/05/2014   REPTSTATUS 11/02/2021 FINAL 10/25/2021   GRAMSTAIN  10/25/2021    RARE WBC PRESENT, PREDOMINANTLY MONONUCLEAR RARE GRAM POSITIVE COCCI IN PAIRS    CULT  10/25/2021    FEW PROTEUS MIRABILIS RARE ENTEROCOCCUS FAECALIS RARE BACTEROIDES FRAGILIS BETA LACTAMASE POSITIVE Performed at [facility], [place] [address], [place], [place] [address]    [place] 10/25/2021   LABORGA ENTEROCOCCUS FAECALIS 10/25/2021     Lab Results  Component Value Date   ALBUMIN 4.1 01/23/2022   ALBUMIN 2.1 (L) 10/23/2021   ALBUMIN 3.0 (L) 12/08/2020    Lab Results  Component Value Date   MG 1.6 (L) 10/26/2021   MG 1.5 (L) 10/25/2021   MG 1.6 (L) 10/24/2021   No results found for: "VD25OH"  No results found for: "PREALBUMIN"    Latest Ref Rng & Units 10/28/2021    1:35 AM 10/26/2021    3:41 AM 10/24/2021    4:35 AM  CBC EXTENDED  WBC 4.0 - 10.5 K/uL 9.6  10.1  8.6   RBC 4.22 - 5.81 MIL/uL 3.20  3.22  3.55   Hemoglobin 13.0 - 17.0 g/dL 9.2  9.2  10.0  HCT 39.0 - 52.0 % 28.8  29.2  31.7   Platelets 150 - 400 K/uL 296  294  265   NEUT# 1.7 - 7.7 K/uL  8.8  6.2   Lymph# 0.7 - 4.0 K/uL  0.5  1.0      There is no height or weight on file to calculate BMI.  Orders:  No orders of the defined types were placed in this encounter.  No orders of the defined types were placed in this encounter.    Procedures: No procedures performed  Clinical Data: No additional findings.  ROS:  All other systems negative, except as noted in the HPI. Review of Systems  Objective: Vital Signs: There were no vitals taken for this visit.  Specialty Comments:  No specialty comments available.  PMFS History: Patient Active Problem List   Diagnosis Date Noted   Phantom limb pain (HCC) 03/07/2022   Abscess of left foot    History of  transmetatarsal amputation of left foot (HCC)    Severe protein-calorie malnutrition (HCC)    CKD (chronic kidney disease) stage 2, GFR 60-89 ml/min 10/22/2021   Hyponatremia 10/22/2021   Chronic foot pain 08/29/2021   Iron deficiency anemia 12/19/2020   Cellulitis of left foot    History of complete ray amputation of fifth toe of left foot (HCC)    Status post amputation of right great toe (HCC)    Diabetic foot infection (HCC) 12/08/2020   Diabetic foot ulcers (HCC) 03/02/2019   Osteomyelitis of fourth toe of left foot (HCC) 03/02/2019   Hypokalemia 03/02/2019   Chronic right shoulder pain 12/02/2018   Diabetes mellitus with complication (HCC) 12/03/2017   Right hip pain 12/27/2015   MRSA (methicillin resistant staph aureus) culture positive 12/31/2012   Open displaced fracture of right great toe 05/20/2012   Anxiety state 12/25/2008   Coronary atherosclerosis 12/25/2008   CHEST PAIN 12/25/2008   Hypothyroidism 05/27/2007   Dyslipidemia 05/27/2007   Gout 05/27/2007   Essential hypertension 05/27/2007   Osteoarthrosis, unspecified whether generalized or localized, unspecified site 05/27/2007   RENAL CALCULUS, HX OF 05/27/2007   Past Medical History:  Diagnosis Date   Anxiety    CAD (coronary artery disease)    sees Dr. [name], normal Stress test 07-16-12    Diabetes mellitus    DJD (degenerative joint disease)    Gout    Hyperlipidemia    Hypertension    Hypothyroid    Osteoarthritis    Renal calculus    hx   Umbilical hernia     Family History  Problem Relation Age of Onset   Heart attack Mother    Arthritis Other    Coronary artery disease Other    Diabetes Other    Hypertension Other    Prostate cancer Other    Stroke Other     Past Surgical History:  Procedure Laterality Date   AMPUTATION  07/17/2012   Procedure: AMPUTATION RAY;  Surgeon: [name], MD;  Location: [facility] OR;  Service: Orthopedics;  Laterality: Right;  right hallux amputation (1st ray  resection )   AMPUTATION Left 03/06/2019   Procedure: Ray Amputation left foot;  Surgeon: [name], MD;  Location: [facility];  Service: Orthopedics;  Laterality: Left;   AMPUTATION Left 10/27/2021   Procedure: LEFT BELOW KNEE AMPUTATION;  Surgeon: [name], MD;  Location: [facility] OR;  Service: Orthopedics;  Laterality: Left;   CORONARY ANGIOPLASTY WITH STENT PLACEMENT     FINGER SURGERY  left index finger   I & D EXTREMITY  05/19/2012   Procedure: IRRIGATION AND DEBRIDEMENT EXTREMITY;  Surgeon: [name], MD;  Location: [facility] OR;  Service: Orthopedics;  Laterality: Right;  Right Great toe   I & D EXTREMITY Left 10/25/2021   Procedure: LEFT FOOT DEDRIDEMENT;  Surgeon: [name], MD;  Location: [facility] OR;  Service: Orthopedics;  Laterality: Left;   I & D Toe  05/19/2012   rt great toe   TONSILLECTOMY     TRANSMETATARSAL AMPUTATION Left 12/10/2020   Procedure: TRANSMETATARSAL AMPUTATION LEFT;  Surgeon: [name], MD;  Location: [facility] OR;  Service: Orthopedics;  Laterality: Left;   Social History   Occupational History   Not on file  Tobacco Use   Smoking status: Never   Smokeless tobacco: Current    Types: Chew   Tobacco comments:    occ  Vaping Use   Vaping Use: Never used  Substance and Sexual Activity   Alcohol use: Yes    Alcohol/week: 0.0 standard drinks of alcohol    Comment: weekends   Drug use: No   Sexual activity: Yes

## 2022-04-20 ENCOUNTER — Other Ambulatory Visit: Payer: Self-pay | Admitting: Family Medicine

## 2022-04-24 ENCOUNTER — Other Ambulatory Visit (HOSPITAL_COMMUNITY): Payer: Self-pay

## 2022-04-24 MED ORDER — COLCHICINE 0.6 MG PO TABS
0.6000 mg | ORAL_TABLET | Freq: Four times a day (QID) | ORAL | 0 refills | Status: DC | PRN
  Filled 2022-04-24: qty 30, 8d supply, fill #0

## 2022-04-26 ENCOUNTER — Other Ambulatory Visit: Payer: Self-pay | Admitting: Family Medicine

## 2022-04-26 DIAGNOSIS — E785 Hyperlipidemia, unspecified: Secondary | ICD-10-CM

## 2022-04-26 DIAGNOSIS — I10 Essential (primary) hypertension: Secondary | ICD-10-CM

## 2022-04-28 ENCOUNTER — Other Ambulatory Visit: Payer: Self-pay | Admitting: Family Medicine

## 2022-04-28 DIAGNOSIS — E118 Type 2 diabetes mellitus with unspecified complications: Secondary | ICD-10-CM

## 2022-04-28 DIAGNOSIS — E7800 Pure hypercholesterolemia, unspecified: Secondary | ICD-10-CM

## 2022-05-21 ENCOUNTER — Telehealth: Payer: Self-pay | Admitting: Orthopedic Surgery

## 2022-05-21 NOTE — Telephone Encounter (Signed)
error 

## 2022-05-21 NOTE — Telephone Encounter (Signed)
Pt called and is wondering if duda does mytchart video appts? Also he states he is asking because he needs a new xray but no longer lives in .   Cb [phone]

## 2022-05-21 NOTE — Telephone Encounter (Signed)
He will need to make an appointment for in office, Dr. Sharol Given does not do virtual appointments.

## 2022-05-28 ENCOUNTER — Encounter: Payer: Self-pay | Admitting: Family Medicine

## 2022-05-28 MED ORDER — ZOLPIDEM TARTRATE 10 MG PO TABS: 10.0000 mg | ORAL_TABLET | Freq: Every evening | ORAL | 1 refills | Status: DC | PRN

## 2022-05-28 NOTE — Telephone Encounter (Signed)
Done

## 2022-06-05 ENCOUNTER — Ambulatory Visit: Payer: No Typology Code available for payment source | Admitting: Family Medicine

## 2022-06-07 ENCOUNTER — Other Ambulatory Visit: Payer: Self-pay | Admitting: Family Medicine

## 2022-06-07 NOTE — Telephone Encounter (Signed)
Last VV-03/07/22 Last refill- 05/08/22--120 tabs, 0 refills  No future OV scheduled.

## 2022-06-11 ENCOUNTER — Telehealth: Payer: Self-pay

## 2022-06-11 IMAGING — MR MR FOOT*L* WO/W CM
9 series · 40 of 40 positions shown · IV contrast (gadavist)
Comparison: Radiographs 10/13/2020 and MRI from 03/03/2019

CLINICAL DATA: Diabetic foot ulcer with history of osteomyelitis
and fifth digit amputation

EXAM:
MRI OF THE LEFT FOREFOOT WITHOUT AND WITH CONTRAST
TECHNIQUE: Multiplanar, multisequence MR imaging of the left forefoot was
performed both before and after administration of intravenous
contrast.
CONTRAST:  9mL GADAVIST GADOBUTROL 1 MMOL/ML IV SOLN

[Series 3: T1 · axial · left · 3.0mm · 0.55mm/px · z∈[-56,+10]mm · 2 of 20 slices shown (1 of 2)]
[im 1/20]
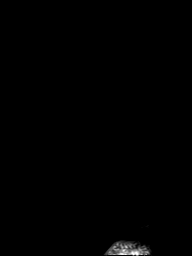
[im 20/20]
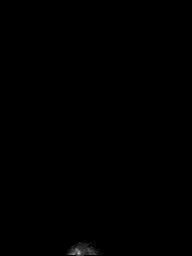

[Series 4: T2 fat-sat · axial · left · 3.0mm · 0.55mm/px · z∈[-56,+10]mm · 3 of 20 slices shown (1 of 2)]
[im 1/20]
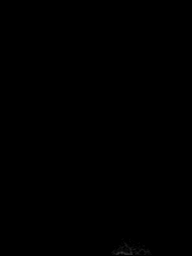
[im 10/20]
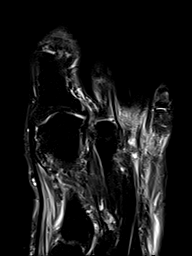
[im 20/20]
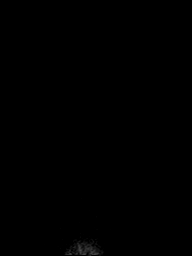

[Series 5: STIR · sagittal · left · 3.0mm · 0.27mm/px · 4 of 24 slices shown]
[im 1/24]
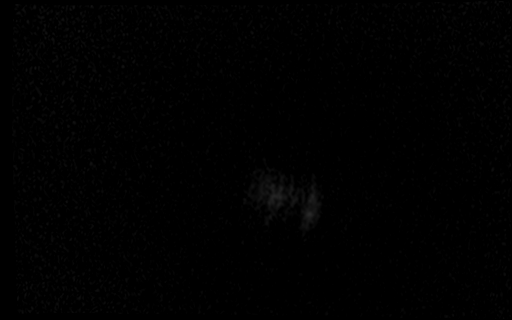
[im 8/24]
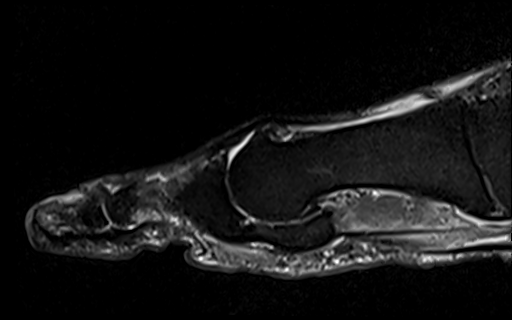
[im 16/24]
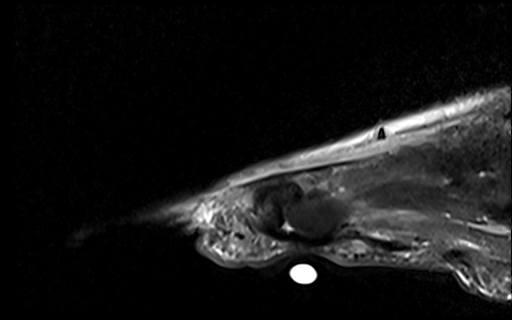
[im 24/24]
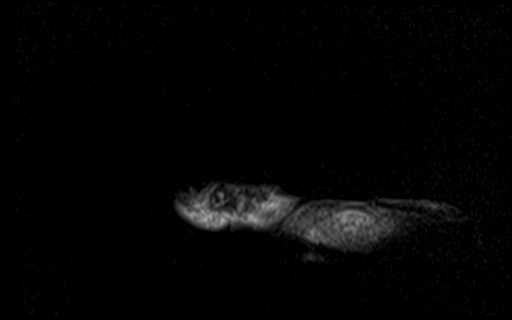

[Series 6: T1 · coronal · left · 3.0mm · 0.47mm/px · 6 of 33 slices shown (2 of 2)]
[im 1/33]
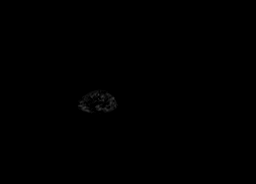
[im 7/33]
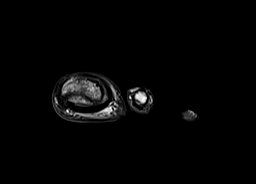
[im 13/33]
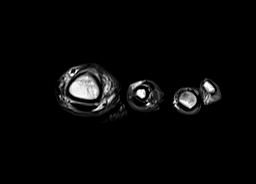
[im 20/33]
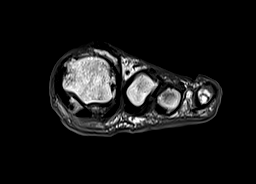
[im 26/33]
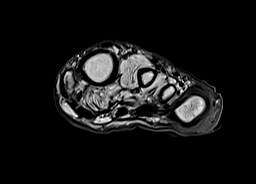
[im 33/33]
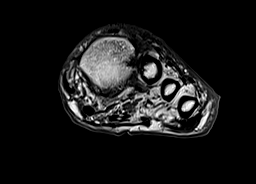

[Series 7: T2 fat-sat · coronal · left · 3.0mm · 0.38mm/px · 6 of 33 slices shown (2 of 2)]
[im 1/33]
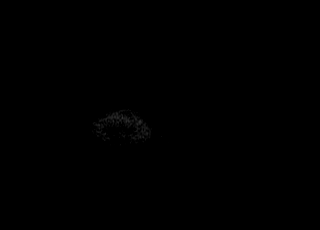
[im 7/33]
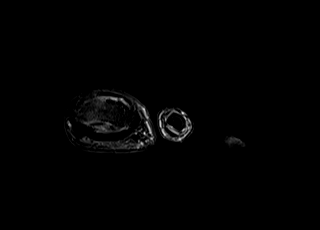
[im 13/33]
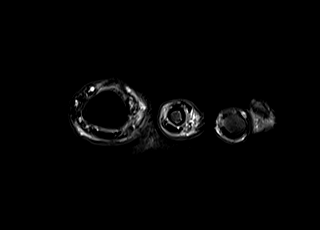
[im 20/33]
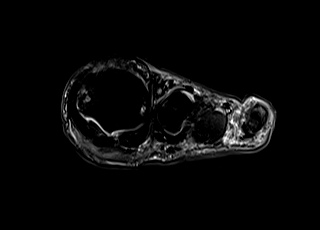
[im 26/33]
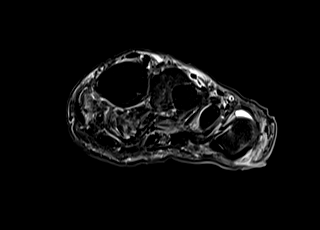
[im 33/33]
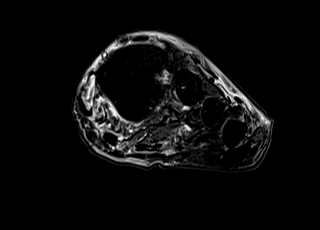

[Series 8: T1 fat-sat · coronal · non-contrast · left · 3.0mm · 0.47mm/px · 6 of 33 slices shown]
[im 1/33]
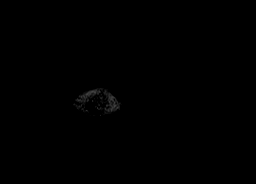
[im 7/33]
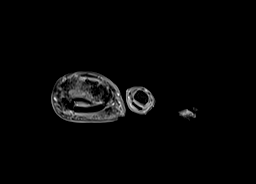
[im 13/33]
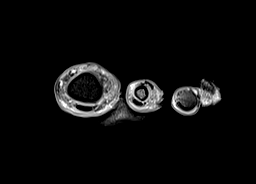
[im 20/33]
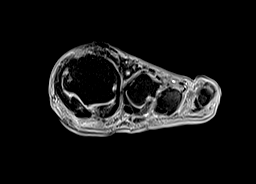
[im 26/33]
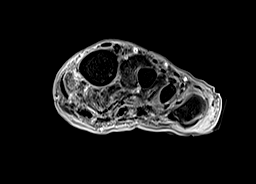
[im 33/33]
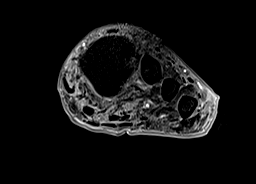

[Series 9: T1 fat-sat post-contrast · coronal · left · 3.0mm · 0.47mm/px · 6 of 33 slices shown (1 of 3)]
[im 1/33]
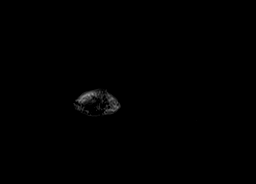
[im 7/33]
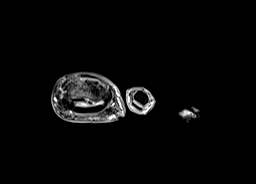
[im 13/33]
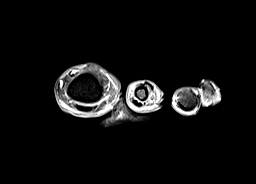
[im 20/33]
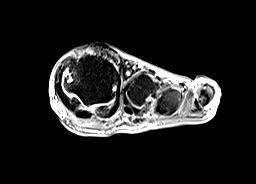
[im 26/33]
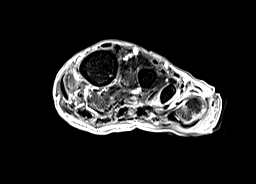
[im 33/33]
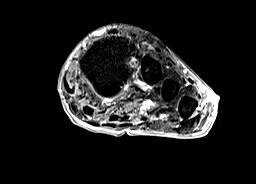

[Series 10: T1 fat-sat post-contrast · sagittal · left · 3.0mm · 0.27mm/px · 4 of 24 slices shown (2 of 3)]
[im 1/24]
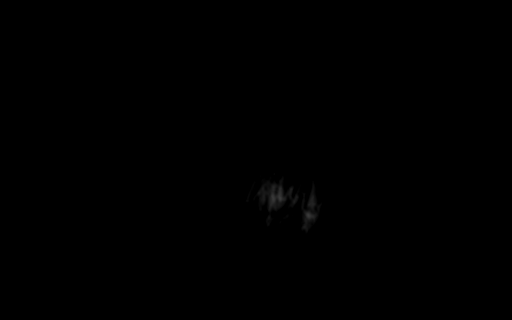
[im 8/24]
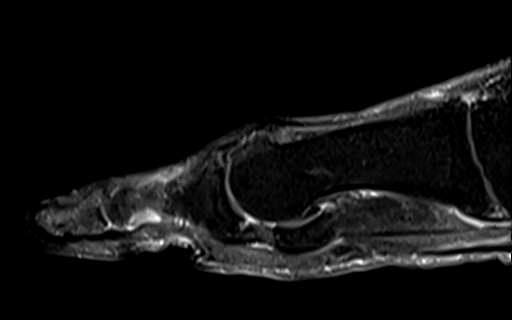
[im 16/24]
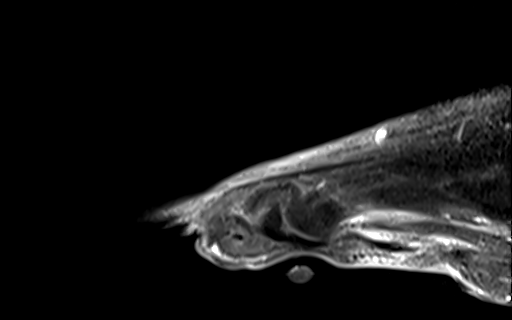
[im 24/24]
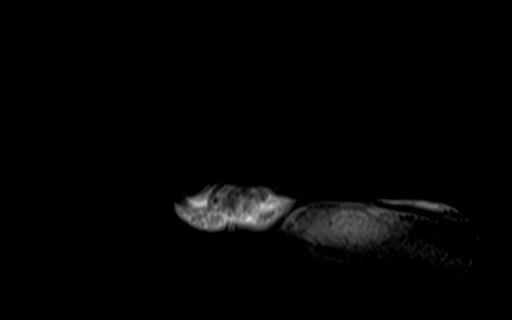

[Series 11: T1 fat-sat post-contrast · axial · left · 3.0mm · 0.44mm/px · z∈[-56,+10]mm · 3 of 20 slices shown (3 of 3)]
[im 1/20]
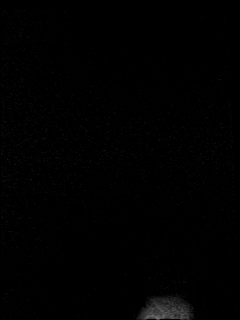
[im 10/20]
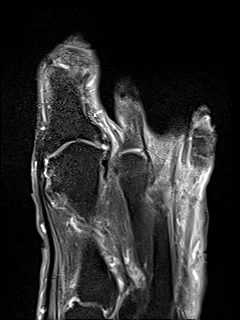
[im 20/20]
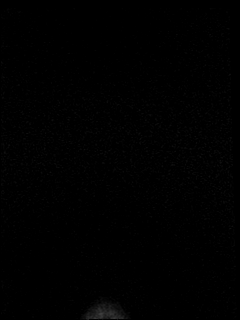

[40 of 40 positions shown; findings below may reference images not displayed]

FINDINGS: Bones/Joint/Cartilage

Amputation of the fifth digit at the level of the base of the
metatarsal.

Low-grade edema signal and enhancement in the distal phalanx great
toe for example on image 7 series 10.

Considerable degenerative arthropathy of the first MTP joint with
associated spurring, and spurring of the sesamoids and the
articulation with the first metatarsal head.

No definite abnormal edema signal or enhancement in the visualized
fourth metatarsal or fourth digit phalanges. However, there does
appear to be a small dorsal effusion of the fourth MTP joint without
substantial thickened synovial enhancement.

Ligaments

The Lisfranc ligament appears intact.

Muscles and Tendons

Regional muscular atrophy.

Soft tissues

Subcutaneous edema and enhancement lateral to the fourth digit
especially along the distal metatarsal and MTP joint, and tracking
around somewhat to the plantar surface, with potential disruption of
the cutaneous surface in this vicinity but no drainable abscess.
Local cellulitis is a distinct possibility.
IMPRESSION: 1. Subcutaneous edema and enhancement lateral to the fourth digit
especially along the distal metatarsal and MTP joint, with potential
disruption of the cutaneous surface in this vicinity but no
drainable abscess.
2. Low-grade edema signal and enhancement in the distal phalanx
great toe. This is probably reactive given the low-grade nature and
lack of substantial surrounding findings, but could reflect early
osteomyelitis.
3. Small dorsal effusion of the fourth MTP joint, without
substantial thickened synovial enhancement. Unlikely to be septic
joint given the lack of synovial thickening.
4. Amputation of the fifth digit at the level of the base of the
metatarsal.
5. Considerable degenerative arthropathy of the first MTP joint.

## 2022-06-11 NOTE — Telephone Encounter (Signed)
Pt changed the appointment into MyChart Video visit for PMV on 06/13/22 at 3.45 pm

## 2022-06-13 ENCOUNTER — Ambulatory Visit: Payer: No Typology Code available for payment source | Admitting: Family Medicine

## 2022-06-13 ENCOUNTER — Telehealth (INDEPENDENT_AMBULATORY_CARE_PROVIDER_SITE_OTHER): Payer: No Typology Code available for payment source | Admitting: Family Medicine

## 2022-06-13 ENCOUNTER — Encounter: Payer: Self-pay | Admitting: Family Medicine

## 2022-06-13 DIAGNOSIS — F1190 Opioid use, unspecified, uncomplicated: Secondary | ICD-10-CM

## 2022-06-13 DIAGNOSIS — G546 Phantom limb syndrome with pain: Secondary | ICD-10-CM | POA: Diagnosis not present

## 2022-06-13 DIAGNOSIS — G8929 Other chronic pain: Secondary | ICD-10-CM | POA: Diagnosis not present

## 2022-06-13 DIAGNOSIS — M25562 Pain in left knee: Secondary | ICD-10-CM

## 2022-06-13 MED ORDER — HYDROCODONE-ACETAMINOPHEN 10-325 MG PO TABS: 1.0000 | ORAL_TABLET | Freq: Four times a day (QID) | ORAL | 0 refills | Status: DC | PRN

## 2022-06-13 MED ORDER — MUPIROCIN 2 % EX OINT: 1.0000 | TOPICAL_OINTMENT | Freq: Two times a day (BID) | CUTANEOUS | 11 refills | Status: DC

## 2022-06-13 MED ORDER — HYDROCODONE-ACETAMINOPHEN 10-325 MG PO TABS: 1.0000 | ORAL_TABLET | Freq: Four times a day (QID) | ORAL | 0 refills | Status: AC | PRN

## 2022-06-13 NOTE — Progress Notes (Signed)
Subjective:    Patient ID: Robert Huang [id number], male    DOB: 1961/03/23, 61 y.o.   MRN: [id number]  HPI Virtual Visit via Video Note  I connected with the patient on 06/13/22 at  3:45 PM EDT by a video enabled telemedicine application and verified that I am speaking with the correct person using two identifiers.  Location patient: home Location provider:work or home office Persons participating in the virtual visit: patient, provider  I discussed the limitations of evaluation and management by telemedicine and the availability of in person appointments. The patient expressed understanding and agreed to proceed.   HPI: Here for pain management. Since our last visit his prosthesis came in and he has been working hard in rehab. He is now able to walk short distances using only a cane. He still fels a little unsteady on his feet, and the phantom pain persists. Now however he is having much more pain in the left knee, and he notes that he has injured this a few times during his life. He has asked his orthopedist, Dr. Lajoyce Corners, abd out this, but so far nothing has been done.    ROS: See pertinent positives and negatives per HPI.  Past Medical History:  Diagnosis Date   Anxiety    CAD (coronary artery disease)    sees Dr. [name], normal Stress test 07-16-12    Diabetes mellitus    DJD (degenerative joint disease)    Gout    Hyperlipidemia    Hypertension    Hypothyroid    Osteoarthritis    Renal calculus    hx   Umbilical hernia     Past Surgical History:  Procedure Laterality Date   AMPUTATION  07/17/2012   Procedure: AMPUTATION RAY;  Surgeon: [name], MD;  Location: [facility] OR;  Service: Orthopedics;  Laterality: Right;  right hallux amputation (1st ray resection )   AMPUTATION Left 03/06/2019   Procedure: Ray Amputation left foot;  Surgeon: [name], MD;  Location: [facility];  Service: Orthopedics;  Laterality: Left;   AMPUTATION Left 10/27/2021   Procedure: LEFT BELOW  KNEE AMPUTATION;  Surgeon: [name], MD;  Location: [facility] OR;  Service: Orthopedics;  Laterality: Left;   CORONARY ANGIOPLASTY WITH STENT PLACEMENT     FINGER SURGERY     left index finger   I & D EXTREMITY  05/19/2012   Procedure: IRRIGATION AND DEBRIDEMENT EXTREMITY;  Surgeon: [name], MD;  Location: [facility] OR;  Service: Orthopedics;  Laterality: Right;  Right Great toe   I & D EXTREMITY Left 10/25/2021   Procedure: LEFT FOOT DEDRIDEMENT;  Surgeon: [name], MD;  Location: [facility] OR;  Service: Orthopedics;  Laterality: Left;   I & D Toe  05/19/2012   rt great toe   TONSILLECTOMY     TRANSMETATARSAL AMPUTATION Left 12/10/2020   Procedure: TRANSMETATARSAL AMPUTATION LEFT;  Surgeon: [name], MD;  Location: [facility] OR;  Service: Orthopedics;  Laterality: Left;    Family History  Problem Relation Age of Onset   Heart attack Mother    Arthritis Other    Coronary artery disease Other    Diabetes Other    Hypertension Other    Prostate cancer Other    Stroke Other      Current Outpatient Medications:    acetaminophen (TYLENOL) 325 MG tablet, Take 2 tablets (650 mg total) by mouth every 6 (six) hours as needed for mild pain (or Fever >/= 101)., Disp:  ,  Rfl:    ALPRAZolam (XANAX) 1 MG tablet, TAKE 1 TABLET BY MOUTH 3 TIMES DAILY AS NEEDED FOR ANXIETY., Disp: 270 tablet, Rfl: 1   amLODipine (NORVASC) 10 MG tablet, TAKE 1 TABLET BY MOUTH EVERY DAY, Disp: 90 tablet, Rfl: 0   Aspirin-Acetaminophen-Caffeine 500-325-65 MG PACK, Take 1 Package by mouth daily as needed (pain)., Disp: , Rfl:    cloNIDine (CATAPRES) 0.3 MG tablet, TAKE 1 TABLET BY MOUTH TWICE A DAY, Disp: 180 tablet, Rfl: 0   colchicine 0.6 MG tablet, Take 1 tablet (0.6 mg total) by mouth every 6 (six) hours as needed (gout). SCHEDULE PHYSICAL FOR FUTURE REFILLS., Disp: 30 tablet, Rfl: 0   cyanocobalamin (,VITAMIN B-12,) 1000 MCG/ML injection, INJECT 1 ML (1,000 MCG TOTAL) INTO THE MUSCLE ONCE A WEEK., Disp: 12 mL, Rfl: 6    ezetimibe (ZETIA) 10 MG tablet, TAKE 1 TABLET BY MOUTH EVERY DAY, Disp: 90 tablet, Rfl: 1   fenofibrate 160 MG tablet, TAKE 1 TABLET BY MOUTH EVERY DAY, Disp: 90 tablet, Rfl: 0   fluticasone (FLONASE) 50 MCG/ACT nasal spray, Place 2 sprays into both nostrils daily., Disp: 16 g, Rfl: 6   glipiZIDE (GLUCOTROL) 5 MG tablet, TAKE 1 TABLET BY MOUTH TWICE A DAY, Disp: 180 tablet, Rfl: 1   hydrALAZINE (APRESOLINE) 10 MG tablet, Take 1 tablet (10 mg total) by mouth in the morning and at bedtime., Disp: 60 tablet, Rfl: 3   indomethacin (INDOCIN SR) 75 MG CR capsule, TAKE 1 CAPSULE BY MOUTH TWICE A DAY WITH A MEAL, Disp: 60 capsule, Rfl: 2   levothyroxine (SYNTHROID) 112 MCG tablet, TAKE 1 TABLET BY MOUTH EVERY DAY, Disp: 90 tablet, Rfl: 1   lisinopril-hydrochlorothiazide (ZESTORETIC) 20-12.5 MG tablet, TAKE 1 TABLET BY MOUTH 2 (TWO) TIMES DAILY. 10AM AND 5PM, Disp: 180 tablet, Rfl: 0   metFORMIN (GLUCOPHAGE) 1000 MG tablet, TAKE 1 TABLET BY MOUTH TWO TIMES A DAY WITH A MEAL, Disp: 180 tablet, Rfl: 1   nitroGLYCERIN (NITROSTAT) 0.4 MG SL tablet, Place 1 tablet (0.4 mg total) under the tongue every 5 (five) minutes as needed. For chest pain., Disp: 25 tablet, Rfl: 5   omeprazole (PRILOSEC) 40 MG capsule, TAKE 1 CAPSULE (40 MG TOTAL) BY MOUTH DAILY., Disp: 90 capsule, Rfl: 2   ONETOUCH ULTRA test strip, 1 EACH BY OTHER ROUTE DAILY. USE AS INSTRUCTED, Disp: 100 strip, Rfl: 1   rosuvastatin (CRESTOR) 40 MG tablet, TAKE 1 TABLET BY MOUTH EVERY DAY, Disp: 90 tablet, Rfl: 0   Syringe/Needle, Disp, (SYRINGE 3CC/25GX1") 25G X 1" 3 ML MISC, 1 application by Does not apply route once a week., Disp: 50 each, Rfl: 5   zolpidem (AMBIEN) 10 MG tablet, Take 1 tablet (10 mg total) by mouth at bedtime as needed. for sleep, Disp: 90 tablet, Rfl: 1   ferrous sulfate 325 (65 FE) MG EC tablet, Take 1 tablet (325 mg total) by mouth 2 (two) times daily., Disp: 60 tablet, Rfl: 0   [START ON 08/13/2022] HYDROcodone-acetaminophen  (NORCO) 10-325 MG tablet, Take 1 tablet by mouth every 6 (six) hours as needed for severe pain., Disp: 120 tablet, Rfl: 0   metoprolol tartrate (LOPRESSOR) 50 MG tablet, Take 1 tablet (50 mg total) by mouth 2 (two) times daily., Disp: 180 tablet, Rfl: 3   mupirocin ointment (BACTROBAN) 2 %, Apply 1 Application topically 2 (two) times daily., Disp: 30 g, Rfl: 11  EXAM:  VITALS per patient if applicable:  GENERAL: alert, oriented, appears well and in no acute  distress  HEENT: atraumatic, conjunttiva clear, no obvious abnormalities on inspection of external nose and ears  NECK: normal movements of the head and neck  LUNGS: on inspection no signs of respiratory distress, breathing rate appears normal, no obvious gross SOB, gasping or wheezing  CV: no obvious cyanosis  MS: moves all visible extremities without noticeable abnormality  PSYCH/NEURO: pleasant and cooperative, no obvious depression or anxiety, speech and thought processing grossly intact  ASSESSMENT AND PLAN: Pain management.  Indication for chronic opioid: phantom limb pain  Medication and dose: Norco 10-325 # pills per month: 120 Last UDS date: 12-05-21 Opioid Treatment Agreement signed (Y/N): 12-10-18 Opioid Treatment Agreement last reviewed with patient:  06-13-22 NCCSRS reviewed this encounter (include red flags): Yes We agreed to increase the Norco from 5-325 to 10-325 as needed. We will also  have him come by our office to get Xrays of the left knee, and we will go from there.  Alysia Penna, MD  Discussed the following assessment and plan:  Chronic pain of left knee - Plan: DG Knee Complete 4 Views Left     I discussed the assessment and treatment plan with the patient. The patient was provided an opportunity to ask questions and all were answered. The patient agreed with the plan and demonstrated an understanding of the instructions.   The patient was advised to call back or seek an in-person evaluation if the  symptoms worsen or if the condition fails to improve as anticipated.      Review of Systems     Objective:   Physical Exam        Assessment & Plan:

## 2022-06-18 ENCOUNTER — Encounter: Payer: Self-pay | Admitting: Family Medicine

## 2022-06-19 NOTE — Telephone Encounter (Signed)
Patient wanted to follow up on message sent yesterday. Patient is completely out of medication. Did advise patient of 2-3 business day policy for refills, new prescriptions request and last minute changes. Patient verbalized understanding.  Please send to  [id number] [place], [address] Phone:  [phone]  Fax:  [phone]        Please advise

## 2022-06-20 ENCOUNTER — Ambulatory Visit (INDEPENDENT_AMBULATORY_CARE_PROVIDER_SITE_OTHER): Payer: No Typology Code available for payment source

## 2022-06-20 ENCOUNTER — Other Ambulatory Visit: Payer: No Typology Code available for payment source

## 2022-06-20 ENCOUNTER — Ambulatory Visit: Payer: No Typology Code available for payment source | Admitting: Family Medicine

## 2022-06-20 DIAGNOSIS — G8929 Other chronic pain: Secondary | ICD-10-CM

## 2022-06-20 DIAGNOSIS — M25562 Pain in left knee: Secondary | ICD-10-CM

## 2022-06-20 MED ORDER — HYDROCODONE-ACETAMINOPHEN 5-325 MG PO TABS: 1.0000 | ORAL_TABLET | Freq: Four times a day (QID) | ORAL | 0 refills | Status: AC | PRN

## 2022-06-20 NOTE — Telephone Encounter (Signed)
I sent in a 30 day supply

## 2022-06-25 ENCOUNTER — Other Ambulatory Visit: Payer: Self-pay | Admitting: Orthopedic Surgery

## 2022-06-25 ENCOUNTER — Other Ambulatory Visit: Payer: Self-pay | Admitting: Family Medicine

## 2022-06-25 DIAGNOSIS — I10 Essential (primary) hypertension: Secondary | ICD-10-CM

## 2022-06-28 ENCOUNTER — Encounter: Payer: Self-pay | Admitting: Family

## 2022-06-28 ENCOUNTER — Ambulatory Visit (INDEPENDENT_AMBULATORY_CARE_PROVIDER_SITE_OTHER): Payer: No Typology Code available for payment source | Admitting: Family

## 2022-06-28 DIAGNOSIS — Z89512 Acquired absence of left leg below knee: Secondary | ICD-10-CM

## 2022-06-28 DIAGNOSIS — S88112A Complete traumatic amputation at level between knee and ankle, left lower leg, initial encounter: Secondary | ICD-10-CM

## 2022-06-28 NOTE — Progress Notes (Addendum)
Office Visit Note   Patient: Robert Huang           Date of Birth: [birth year]           MRN: [id number] Visit Date: 06/28/2022              Requested by: [name], MD [address] [place],  [place] [address] PCP: [name], MD  Chief Complaint  Patient presents with   Left Leg - Follow-up    10/27/21 Left BKA      HPI: The patient is a 61 year old gentleman who is seen today in follow-up.  He is status post left below-knee amputation in March of this year due to osteomyelitis of the left foot.  He is currently in his temporary prosthesis and is ready to progress and have fabrication of his definitive prosthesis.  He has returned to work and is quite active working long shifts on his feet.  He has gained strength.  He is also had subsequent volume loss of his residual limb and is no longer having an appropriate fit in his socket.  He is currently working with [facility]  Prior to amputation the patient was active in his community playing pickleball, baseball, golf, and hiking.  Patient is an existing left transtibial  amputee.  Patient's current comorbidities are not expected to impact the ability to function with the prescribed prosthesis. Patient verbally communicates a strong desire to use a prosthesis. Patient currently requires mobility aids to ambulate without a prosthesis.  Expects not to use mobility aids with a new prosthesis.  Patient is a K3 level ambulator that spends a lot of time walking around on uneven terrain over obstacles, up and down stairs, and ambulates with a variable cadence.     Assessment & Plan: Visit Diagnoses: No diagnosis found.  Plan: Order provided for definitive prosthesis set up.  He will follow-up on an as-needed basis.  Follow-Up Instructions: No follow-ups on file.   Ortho Exam  Patient is alert, oriented, no adenopathy, well-dressed, normal affect, normal respiratory effort. On examination of the left  residual limb this is well-healed well consolidated.  There is no callus no impending skin breakdown  Imaging: No results found. No images are attached to the encounter.  Labs: Lab Results  Component Value Date   HGBA1C 7.2 (H) 10/24/2021   HGBA1C 6.6 (H) 12/08/2020   HGBA1C 7.5 (H) 09/28/2020   ESRSEDRATE >140 (H) 10/22/2021   ESRSEDRATE 50 (H) 03/02/2019   ESRSEDRATE 63 (H) 07/17/2012   CRP 20.1 (H) 10/22/2021   CRP 2.9 (H) 03/02/2019   CRP 1.3 (H) 07/17/2012   LABURIC 7.2 12/03/2017   LABURIC 7.6 01/11/2017   LABURIC 12.7 (H) 11/05/2014   REPTSTATUS 11/02/2021 FINAL 10/25/2021   GRAMSTAIN  10/25/2021    RARE WBC PRESENT, PREDOMINANTLY MONONUCLEAR RARE GRAM POSITIVE COCCI IN PAIRS    CULT  10/25/2021    FEW PROTEUS MIRABILIS RARE ENTEROCOCCUS FAECALIS RARE BACTEROIDES FRAGILIS BETA LACTAMASE POSITIVE Performed at [facility], [address], [place], [place] [address]    Imelda Pillow PROTEUS MIRABILIS 10/25/2021   LABORGA ENTEROCOCCUS FAECALIS 10/25/2021     Lab Results  Component Value Date   ALBUMIN 4.1 01/23/2022   ALBUMIN 2.1 (L) 10/23/2021   ALBUMIN 3.0 (L) 12/08/2020    Lab Results  Component Value Date   MG 1.6 (L) 10/26/2021   MG 1.5 (L) 10/25/2021   MG 1.6 (L) 10/24/2021   No results found  for: "VD25OH"  No results found for: "PREALBUMIN"    Latest Ref Rng & Units 10/28/2021    1:35 AM 10/26/2021    3:41 AM 10/24/2021    4:35 AM  CBC EXTENDED  WBC 4.0 - 10.5 K/uL 9.6  10.1  8.6   RBC 4.22 - 5.81 MIL/uL 3.20  3.22  3.55   Hemoglobin 13.0 - 17.0 g/dL 9.2  9.2  10.0   HCT 39.0 - 52.0 % 28.8  29.2  31.7   Platelets 150 - 400 K/uL 296  294  265   NEUT# 1.7 - 7.7 K/uL  8.8  6.2   Lymph# 0.7 - 4.0 K/uL  0.5  1.0      There is no height or weight on file to calculate BMI.  Orders:  No orders of the defined types were placed in this encounter.  No orders of the defined types were placed in this encounter.    Procedures: No procedures  performed  Clinical Data: No additional findings.  ROS:  All other systems negative, except as noted in the HPI. Review of Systems  Objective: Vital Signs: There were no vitals taken for this visit.  Specialty Comments:  No specialty comments available.  PMFS History: Patient Active Problem List   Diagnosis Date Noted   Phantom limb pain ([place]) 03/07/2022   Abscess of left foot    History of transmetatarsal amputation of left foot (HCC)    Severe protein-calorie malnutrition (HCC)    CKD (chronic kidney disease) stage 2, GFR 60-89 ml/min 10/22/2021   Hyponatremia 10/22/2021   Chronic foot pain 08/29/2021   Iron deficiency anemia 12/19/2020   Cellulitis of left foot    History of complete ray amputation of fifth toe of left foot ([place])    Status post amputation of right great toe (HCC)    Diabetic foot infection ([place]) 12/08/2020   Diabetic foot ulcers ([place]) 03/02/2019   Osteomyelitis of fourth toe of left foot ([place]) 03/02/2019   Hypokalemia 03/02/2019   Chronic right shoulder pain 12/02/2018   Diabetes mellitus with complication ([place]) 05/39/7673   Right hip pain 12/27/2015   MRSA (methicillin resistant staph aureus) culture positive 12/31/2012   Open displaced fracture of right great toe 05/20/2012   Anxiety state 12/25/2008   Coronary atherosclerosis 12/25/2008   CHEST PAIN 12/25/2008   Hypothyroidism 05/27/2007   Dyslipidemia 05/27/2007   Gout 05/27/2007   Essential hypertension 05/27/2007   Osteoarthrosis, unspecified whether generalized or localized, unspecified site 05/27/2007   RENAL CALCULUS, HX OF 05/27/2007   Past Medical History:  Diagnosis Date   Anxiety    CAD (coronary artery disease)    sees Dr. Stanford Breed, normal Stress test 07-16-12    Diabetes mellitus    DJD (degenerative joint disease)    Gout    Hyperlipidemia    Hypertension    Hypothyroid    Osteoarthritis    Renal calculus    hx   Umbilical hernia     Family History  Problem Relation  Age of Onset   Heart attack Mother    Arthritis Other    Coronary artery disease Other    Diabetes Other    Hypertension Other    Prostate cancer Other    Stroke Other     Past Surgical History:  Procedure Laterality Date   AMPUTATION  07/17/2012   Procedure: AMPUTATION RAY;  Surgeon: Wylene Simmer, MD;  Location: [place];  Service: Orthopedics;  Laterality: Right;  right hallux amputation (1st  ray resection )   AMPUTATION Left 03/06/2019   Procedure: Ray Amputation left foot;  Surgeon: [name], MD;  Location: [facility];  Service: Orthopedics;  Laterality: Left;   AMPUTATION Left 10/27/2021   Procedure: LEFT BELOW KNEE AMPUTATION;  Surgeon: [name], MD;  Location: [facility] OR;  Service: Orthopedics;  Laterality: Left;   CORONARY ANGIOPLASTY WITH STENT PLACEMENT     FINGER SURGERY     left index finger   I & D EXTREMITY  05/19/2012   Procedure: IRRIGATION AND DEBRIDEMENT EXTREMITY;  Surgeon: [name], MD;  Location: [facility] OR;  Service: Orthopedics;  Laterality: Right;  Right Great toe   I & D EXTREMITY Left 10/25/2021   Procedure: LEFT FOOT DEDRIDEMENT;  Surgeon: [name], MD;  Location: [facility] OR;  Service: Orthopedics;  Laterality: Left;   I & D Toe  05/19/2012   rt great toe   TONSILLECTOMY     TRANSMETATARSAL AMPUTATION Left 12/10/2020   Procedure: TRANSMETATARSAL AMPUTATION LEFT;  Surgeon: [name], MD;  Location: [facility] OR;  Service: Orthopedics;  Laterality: Left;   Social History   Occupational History   Not on file  Tobacco Use   Smoking status: Never   Smokeless tobacco: Current    Types: Chew   Tobacco comments:    occ  Vaping Use   Vaping Use: Never used  Substance and Sexual Activity   Alcohol use: Yes    Alcohol/week: 0.0 standard drinks of alcohol    Comment: weekends   Drug use: No   Sexual activity: Yes

## 2022-07-03 ENCOUNTER — Telehealth: Payer: Self-pay | Admitting: Family

## 2022-07-03 NOTE — Telephone Encounter (Signed)
Robert Huang from [facility] called requesting office progress note for visit 06/28/22 and diagnosis code for script. Please call Annette back at [phone].

## 2022-07-06 ENCOUNTER — Telehealth: Payer: Self-pay | Admitting: Family

## 2022-07-06 NOTE — Telephone Encounter (Signed)
Received vm from [place] at [employer] requesting last ov note , I faxed [phone]

## 2022-07-10 NOTE — Telephone Encounter (Signed)
Faxed to [phone] which is the fax number on previous paperwork from [employer]. With expanded view for dx code which was also sent on fax [phone] to call with any questions.

## 2022-07-17 ENCOUNTER — Telehealth: Payer: Self-pay | Admitting: Family

## 2022-07-17 NOTE — Telephone Encounter (Signed)
Pt called requesting PA Erin to send an supporting notes notes for his prostatic company [employer]. Phone number is [phone]. Pt states PA Denny Peon has faxed notes to this company before. Pt phone number is [phone].

## 2022-07-18 NOTE — Telephone Encounter (Signed)
Faxed again through [employer] and through email to [phone]

## 2022-07-18 NOTE — Telephone Encounter (Signed)
I called and lm on vm to advise that MR faxed note on 07/06/22, I faxed again on 07/10/2022. Can you please sent one moe time office visit note to [facility]?

## 2022-07-24 ENCOUNTER — Telehealth: Payer: Self-pay | Admitting: Family

## 2022-07-24 NOTE — Telephone Encounter (Signed)
I put a paper on your desk about amending the note from this pt's last visit so we can send it to [facility]. Can you let me know when this has been done and I can resend the updated version?

## 2022-07-24 NOTE — Telephone Encounter (Signed)
Received vm from patient. He stated that we keep faxing the same 06/28/22 ov note to [employer]. He states the o.v. note needs to be amended as it does not support his need for prosthesis. He asks that someone calls [facility] to find out exactly what the addendum needs to say. Patient states this is a time sensitive matter. [place] P&[facility] [phone], pts ph [phone]

## 2022-07-26 ENCOUNTER — Other Ambulatory Visit: Payer: Self-pay | Admitting: Family Medicine

## 2022-07-27 NOTE — Telephone Encounter (Signed)
Pt LOV was on 06/13/22 Last refill was done on 01/05/22 Please advise

## 2022-08-14 ENCOUNTER — Telehealth: Payer: Self-pay | Admitting: Family Medicine

## 2022-08-14 MED ORDER — COLCHICINE 0.6 MG PO TABS: 0.6000 mg | ORAL_TABLET | Freq: Four times a day (QID) | ORAL | 0 refills | Status: DC | PRN

## 2022-08-14 MED ORDER — DOXYCYCLINE HYCLATE 100 MG PO CAPS: 100.0000 mg | ORAL_CAPSULE | Freq: Two times a day (BID) | ORAL | 0 refills | Status: DC

## 2022-08-14 NOTE — Telephone Encounter (Signed)
Done

## 2022-08-14 NOTE — Telephone Encounter (Signed)
Pharmacy updated.

## 2022-08-14 NOTE — Telephone Encounter (Signed)
Called patient to see if he could come in office for his mychart appointment on 12/21. Patient stated that he is leaving for [place] today for Christmas traveling and will not be in state during this appointment time. Patient wanted to know if he could just have colchicine 0.6 MG tablet sent in. Patient also worries about possibility of gout getting infected and wonders if antibiotics can be sent as well, just in case.      [facility] to  [id number] [place], [place] - [address] Phone: [phone]  Fax: [phone]        Please advise

## 2022-08-16 ENCOUNTER — Telehealth: Payer: No Typology Code available for payment source | Admitting: Family Medicine

## 2022-08-16 ENCOUNTER — Encounter: Payer: Self-pay | Admitting: Family Medicine

## 2022-08-18 ENCOUNTER — Other Ambulatory Visit: Payer: Self-pay | Admitting: Family Medicine

## 2022-08-18 DIAGNOSIS — E785 Hyperlipidemia, unspecified: Secondary | ICD-10-CM

## 2022-08-21 ENCOUNTER — Other Ambulatory Visit: Payer: Self-pay | Admitting: Family Medicine

## 2022-08-23 ENCOUNTER — Other Ambulatory Visit: Payer: Self-pay | Admitting: Family Medicine

## 2022-08-27 ENCOUNTER — Other Ambulatory Visit: Payer: Self-pay | Admitting: Family Medicine

## 2022-08-27 DIAGNOSIS — I10 Essential (primary) hypertension: Secondary | ICD-10-CM

## 2022-09-23 ENCOUNTER — Other Ambulatory Visit: Payer: Self-pay | Admitting: Family Medicine

## 2022-09-23 DIAGNOSIS — I10 Essential (primary) hypertension: Secondary | ICD-10-CM

## 2022-09-27 ENCOUNTER — Other Ambulatory Visit: Payer: Self-pay | Admitting: Family Medicine

## 2022-10-22 ENCOUNTER — Encounter: Payer: Self-pay | Admitting: Family Medicine

## 2022-10-22 ENCOUNTER — Ambulatory Visit: Payer: No Typology Code available for payment source | Admitting: Family Medicine

## 2022-10-22 VITALS — BP 124/68 | HR 52 | Temp 98.0°F | Wt 200.0 lb

## 2022-10-22 DIAGNOSIS — N390 Urinary tract infection, site not specified: Secondary | ICD-10-CM

## 2022-10-22 DIAGNOSIS — R350 Frequency of micturition: Secondary | ICD-10-CM

## 2022-10-22 DIAGNOSIS — E118 Type 2 diabetes mellitus with unspecified complications: Secondary | ICD-10-CM

## 2022-10-22 DIAGNOSIS — K5900 Constipation, unspecified: Secondary | ICD-10-CM

## 2022-10-22 LAB — POC URINALSYSI DIPSTICK (AUTOMATED)
Bilirubin, UA: NEGATIVE
Glucose, UA: NEGATIVE
Ketones, UA: NEGATIVE
Leukocytes, UA: NEGATIVE
Nitrite, UA: NEGATIVE
Protein, UA: POSITIVE — AB
Spec Grav, UA: 1.015 (ref 1.010–1.025)
Urobilinogen, UA: 0.2 E.U./dL
pH, UA: 6 (ref 5.0–8.0)

## 2022-10-22 LAB — POCT GLYCOSYLATED HEMOGLOBIN (HGB A1C): Hemoglobin A1C: 6.9 % — AB (ref 4.0–5.6)

## 2022-10-22 MED ORDER — CIPROFLOXACIN HCL 500 MG PO TABS: 500.0000 mg | ORAL_TABLET | Freq: Two times a day (BID) | ORAL | 0 refills | Status: AC

## 2022-10-22 NOTE — Progress Notes (Signed)
   Subjective:    Patient ID: Robert Huang [id number], male    DOB: 11-25-1960, 62 y.o.   MRN: [id number]  HPI Here for 10 days of intermittent pains in the lower back, the right side being worse than the left. He also feels a pressure in the abdomen, though not abdominal pain. He often feels bloated. He admits to drinking very little water and he gets little fiber in this diet. He typically eats only one meal a day, and this is in the evenings. He averages one BM every day, and these are normal in consistency, but he usually feels like he is not completely evacuating his rectum. Sometimes when the back pain gets bad, it makes him nauseated, and he vomited once last night. He has also noticed that he has to urinate more frequently than usual, though he denies any urgency or burning. He says the back pain always feels better when he passes a stool, then the pain returns a few hours later. No fever. No blood in the urine or stool. He has never had a colonoscopy.    Review of Systems  Constitutional: Negative.   Respiratory: Negative.    Cardiovascular: Negative.   Gastrointestinal:  Positive for constipation, nausea and vomiting. Negative for abdominal distention, abdominal pain, anal bleeding, blood in stool, diarrhea and rectal pain.  Genitourinary:  Positive for frequency. Negative for difficulty urinating, dysuria, flank pain, hematuria and urgency.       Objective:   Physical Exam Constitutional:      Appearance: Normal appearance. He is toxic-appearing. He is not ill-appearing.  Cardiovascular:     Rate and Rhythm: Normal rate and regular rhythm.     Pulses: Normal pulses.     Heart sounds: Normal heart sounds.  Pulmonary:     Effort: Pulmonary effort is normal.     Breath sounds: Normal breath sounds.  Abdominal:     General: Abdomen is flat. Bowel sounds are normal. There is no distension.     Palpations: Abdomen is soft. There is no mass.     Tenderness: There is no abdominal  tenderness. There is no right CVA tenderness, left CVA tenderness, guarding or rebound.     Hernia: No hernia is present.  Genitourinary:    Testes: Normal.     Prostate: Normal.     Rectum: Normal.  Neurological:     Mental Status: He is alert.           Assessment & Plan:  He seems to have a combination of constipation and a UTI. We will culture the urine sample and we will treat the UTI with 10 days of Cipro. For the constipation, he will drink more water and he will take Miralax every day. Recheck as needed.  Alysia Penna, MD

## 2022-10-24 LAB — URINE CULTURE
MICRO NUMBER:: 14620301
Result:: NO GROWTH
SPECIMEN QUALITY:: ADEQUATE

## 2022-10-26 ENCOUNTER — Other Ambulatory Visit: Payer: Self-pay

## 2022-10-26 ENCOUNTER — Encounter: Payer: Self-pay | Admitting: Family Medicine

## 2022-10-26 MED ORDER — HYDROCODONE-ACETAMINOPHEN 10-325 MG PO TABS: 1.0000 | ORAL_TABLET | Freq: Four times a day (QID) | ORAL | 0 refills | Status: AC | PRN

## 2022-10-26 NOTE — Telephone Encounter (Signed)
Ok to schedule pt for a PMV /UDS appoitnment, please advise

## 2022-10-26 NOTE — Telephone Encounter (Signed)
I sent in a one month refill, but he will need a PMV for any after that

## 2022-10-29 ENCOUNTER — Telehealth: Payer: Self-pay

## 2022-11-02 NOTE — Telephone Encounter (Signed)
Left message to call back in regards to refill request

## 2022-11-02 NOTE — Telephone Encounter (Signed)
Patient is returning call.  °

## 2022-11-02 NOTE — Telephone Encounter (Signed)
Left message to call back  

## 2022-11-13 NOTE — Telephone Encounter (Signed)
Left message to call back Unable to reach x3-encounter closed

## 2022-11-16 ENCOUNTER — Telehealth: Payer: Self-pay | Admitting: Cardiology

## 2022-11-16 ENCOUNTER — Other Ambulatory Visit: Payer: Self-pay | Admitting: Family Medicine

## 2022-11-16 NOTE — Telephone Encounter (Signed)
Called patient LVM to call office to set appt. For any further refills.  Unsure if any other needs

## 2022-11-16 NOTE — Telephone Encounter (Signed)
Pt returning nurses call from 3/19. Please advise

## 2022-11-19 NOTE — Telephone Encounter (Signed)
LVM for patient to call office regarding initial call from our office. Looks like patient needs appt. For med refills

## 2022-11-23 NOTE — Telephone Encounter (Signed)
Third call to patient. Left messages other two times.  Voicemail is now full and unable to LM.  Will wait for patient to call our office

## 2022-11-27 ENCOUNTER — Encounter: Payer: Self-pay | Admitting: Family Medicine

## 2022-11-27 ENCOUNTER — Telehealth (INDEPENDENT_AMBULATORY_CARE_PROVIDER_SITE_OTHER): Payer: No Typology Code available for payment source | Admitting: Family Medicine

## 2022-11-27 ENCOUNTER — Other Ambulatory Visit: Payer: Self-pay | Admitting: Family Medicine

## 2022-11-27 DIAGNOSIS — G546 Phantom limb syndrome with pain: Secondary | ICD-10-CM

## 2022-11-27 DIAGNOSIS — F1190 Opioid use, unspecified, uncomplicated: Secondary | ICD-10-CM | POA: Diagnosis not present

## 2022-11-27 DIAGNOSIS — E7800 Pure hypercholesterolemia, unspecified: Secondary | ICD-10-CM

## 2022-11-27 MED ORDER — COLCHICINE 0.6 MG PO TABS: 0.6000 mg | ORAL_TABLET | Freq: Four times a day (QID) | ORAL | 11 refills | Status: DC | PRN

## 2022-11-27 MED ORDER — HYDROCODONE-ACETAMINOPHEN 10-325 MG PO TABS: 1.0000 | ORAL_TABLET | Freq: Four times a day (QID) | ORAL | 0 refills | Status: AC | PRN

## 2022-11-27 MED ORDER — HYDROCODONE-ACETAMINOPHEN 10-325 MG PO TABS: 1.0000 | ORAL_TABLET | Freq: Four times a day (QID) | ORAL | 0 refills | Status: DC | PRN

## 2022-11-27 NOTE — Telephone Encounter (Signed)
Last lipid labs checked in Cardiology-01/23/22 Last OV-10/22/22  Please advise if okay to send in refill

## 2022-11-27 NOTE — Progress Notes (Signed)
Subjective:    Patient ID: Nivin Salz [id number], male    DOB: 08/22/1961, 62 y.o.   MRN: [id number]  HPI Virtual Visit via Video Note  I connected with the patient on 11/27/22 at  1:30 PM EDT by a video enabled telemedicine application and verified that I am speaking with the correct person using two identifiers.  Location patient: home Location provider:work or home office Persons participating in the virtual visit: patient, provider  I discussed the limitations of evaluation and management by telemedicine and the availability of in person appointments. The patient expressed understanding and agreed to proceed.   HPI: Here for pain management. He is doing well.    ROS: See pertinent positives and negatives per HPI.  Past Medical History:  Diagnosis Date   Anxiety    CAD (coronary artery disease)    sees Dr. Stanford Breed, normal Stress test 07-16-12    Diabetes mellitus    DJD (degenerative joint disease)    Gout    Hyperlipidemia    Hypertension    Hypothyroid    Osteoarthritis    Renal calculus    hx   Umbilical hernia     Past Surgical History:  Procedure Laterality Date   AMPUTATION  07/17/2012   Procedure: AMPUTATION RAY;  Surgeon: Wylene Simmer, MD;  Location: [place];  Service: Orthopedics;  Laterality: Right;  right hallux amputation (1st ray resection )   AMPUTATION Left 03/06/2019   Procedure: Ray Amputation left foot;  Surgeon: Nicholes Stairs, MD;  Location: [facility];  Service: Orthopedics;  Laterality: Left;   AMPUTATION Left 10/27/2021   Procedure: LEFT BELOW KNEE AMPUTATION;  Surgeon: Newt Minion, MD;  Location: [place];  Service: Orthopedics;  Laterality: Left;   CORONARY ANGIOPLASTY WITH STENT PLACEMENT     FINGER SURGERY     left index finger   I & D EXTREMITY  05/19/2012   Procedure: IRRIGATION AND DEBRIDEMENT EXTREMITY;  Surgeon: Marin Shutter, MD;  Location: [place];  Service: Orthopedics;  Laterality: Right;  Right Great toe   I & D EXTREMITY Left  10/25/2021   Procedure: LEFT FOOT [place];  Surgeon: Newt Minion, MD;  Location: [place];  Service: Orthopedics;  Laterality: Left;   I & D Toe  05/19/2012   rt great toe   TONSILLECTOMY     TRANSMETATARSAL AMPUTATION Left 12/10/2020   Procedure: TRANSMETATARSAL AMPUTATION LEFT;  Surgeon: Newt Minion, MD;  Location: [place];  Service: Orthopedics;  Laterality: Left;    Family History  Problem Relation Age of Onset   Heart attack Mother    Arthritis Other    Coronary artery disease Other    Diabetes Other    Hypertension Other    Prostate cancer Other    Stroke Other      Current Outpatient Medications:    acetaminophen (TYLENOL) 325 MG tablet, Take 2 tablets (650 mg total) by mouth every 6 (six) hours as needed for mild pain (or Fever >/= 101)., Disp:  , Rfl:    ALPRAZolam (XANAX) 1 MG tablet, TAKE 1 TABLET BY MOUTH THREE TIMES A DAY AS NEEDED FOR ANXIETY, Disp: 270 tablet, Rfl: 1   amLODipine (NORVASC) 10 MG tablet, TAKE 1 TABLET BY MOUTH EVERY DAY, Disp: 90 tablet, Rfl: 0   Aspirin-Acetaminophen-Caffeine 500-325-65 MG PACK, Take 1 Package by mouth daily as needed (pain)., Disp: , Rfl:    cloNIDine (CATAPRES) 0.3 MG tablet, TAKE 1 TABLET BY MOUTH TWICE A DAY, Disp:  180 tablet, Rfl: 0   colchicine 0.6 MG tablet, Take 1 tablet (0.6 mg total) by mouth every 6 (six) hours as needed (gout)., Disp: 60 tablet, Rfl: 0   cyanocobalamin (,VITAMIN B-12,) 1000 MCG/ML injection, INJECT 1 ML (1,000 MCG TOTAL) INTO THE MUSCLE ONCE A WEEK., Disp: 12 mL, Rfl: 6   doxycycline (VIBRAMYCIN) 100 MG capsule, Take 1 capsule (100 mg total) by mouth 2 (two) times daily., Disp: 60 capsule, Rfl: 0   ezetimibe (ZETIA) 10 MG tablet, TAKE 1 TABLET BY MOUTH EVERY DAY, Disp: 30 tablet, Rfl: 11   fenofibrate 160 MG tablet, TAKE 1 TABLET BY MOUTH EVERY DAY, Disp: 90 tablet, Rfl: 0   ferrous sulfate 325 (65 FE) MG EC tablet, Take 1 tablet (325 mg total) by mouth 2 (two) times daily., Disp: 60 tablet, Rfl: 0    fluticasone (FLONASE) 50 MCG/ACT nasal spray, Place 2 sprays into both nostrils daily., Disp: 16 g, Rfl: 6   glipiZIDE (GLUCOTROL) 5 MG tablet, TAKE 1 TABLET BY MOUTH TWICE A DAY, Disp: 180 tablet, Rfl: 1   hydrALAZINE (APRESOLINE) 10 MG tablet, Take 1 tablet (10 mg total) by mouth in the morning and at bedtime., Disp: 60 tablet, Rfl: 3   indomethacin (INDOCIN SR) 75 MG CR capsule, TAKE 1 CAPSULE BY MOUTH TWICE A DAY WITH A MEAL, Disp: 60 capsule, Rfl: 2   levothyroxine (SYNTHROID) 112 MCG tablet, TAKE 1 TABLET BY MOUTH EVERY DAY, Disp: 90 tablet, Rfl: 1   lisinopril-hydrochlorothiazide (ZESTORETIC) 20-12.5 MG tablet, TAKE 1 TABLET BY MOUTH 2 (TWO) TIMES DAILY. 10AM AND 5PM, Disp: 180 tablet, Rfl: 0   metFORMIN (GLUCOPHAGE) 1000 MG tablet, TAKE 1 TABLET BY MOUTH TWO TIMES A DAY WITH A MEAL, Disp: 180 tablet, Rfl: 1   metoprolol tartrate (LOPRESSOR) 50 MG tablet, Take 1 tablet (50 mg total) by mouth 2 (two) times daily., Disp: 180 tablet, Rfl: 3   mupirocin ointment (BACTROBAN) 2 %, Apply 1 Application topically 2 (two) times daily., Disp: 30 g, Rfl: 11   nitroGLYCERIN (NITROSTAT) 0.4 MG SL tablet, Place 1 tablet (0.4 mg total) under the tongue every 5 (five) minutes as needed. For chest pain., Disp: 25 tablet, Rfl: 5   omeprazole (PRILOSEC) 40 MG capsule, TAKE 1 CAPSULE (40 MG TOTAL) BY MOUTH DAILY., Disp: 90 capsule, Rfl: 2   ONETOUCH ULTRA test strip, 1 EACH BY OTHER ROUTE DAILY. USE AS INSTRUCTED, Disp: 100 strip, Rfl: 1   rosuvastatin (CRESTOR) 40 MG tablet, TAKE 1 TABLET BY MOUTH EVERY DAY, Disp: 90 tablet, Rfl: 1   Syringe/Needle, Disp, (SYRINGE 3CC/25GX1") 25G X 1" 3 ML MISC, 1 application by Does not apply route once a week., Disp: 50 each, Rfl: 5   zolpidem (AMBIEN) 10 MG tablet, Take 1 tablet (10 mg total) by mouth at bedtime as needed. for sleep, Disp: 90 tablet, Rfl: 1  EXAM:  VITALS per patient if applicable:  GENERAL: alert, oriented, appears well and in no acute distress  HEENT:  atraumatic, conjunttiva clear, no obvious abnormalities on inspection of external nose and ears  NECK: normal movements of the head and neck  LUNGS: on inspection no signs of respiratory distress, breathing rate appears normal, no obvious gross SOB, gasping or wheezing  CV: no obvious cyanosis  MS: moves all visible extremities without noticeable abnormality  PSYCH/NEURO: pleasant and cooperative, no obvious depression or anxiety, speech and thought processing grossly intact  ASSESSMENT AND PLAN: Pain management.  Indication for chronic opioid: phantom limb pain  Medication and dose: Norco 10-325 # pills per month: 120 Last UDS date: 12-05-21 Opioid Treatment Agreement signed (Y/N): 12-10-18 Opioid Treatment Agreement last reviewed with patient:  11-27-22 [facility] reviewed this encounter (include red flags): Yes Meds were refilled. He is due for a urine drug screen, so he will stop by soon to take care of this.  Alysia Penna, MD  Discussed the following assessment and plan:  No diagnosis found.     I discussed the assessment and treatment plan with the patient. The patient was provided an opportunity to ask questions and all were answered. The patient agreed with the plan and demonstrated an understanding of the instructions.   The patient was advised to call back or seek an in-person evaluation if the symptoms worsen or if the condition fails to improve as anticipated.      Review of Systems     Objective:   Physical Exam        Assessment & Plan:

## 2022-11-28 ENCOUNTER — Other Ambulatory Visit: Payer: Self-pay | Admitting: Family Medicine

## 2022-11-28 DIAGNOSIS — I10 Essential (primary) hypertension: Secondary | ICD-10-CM

## 2022-11-29 ENCOUNTER — Other Ambulatory Visit: Payer: Self-pay | Admitting: *Deleted

## 2022-11-29 MED ORDER — HYDRALAZINE HCL 10 MG PO TABS: 10.0000 mg | ORAL_TABLET | Freq: Two times a day (BID) | ORAL | 0 refills | Status: DC

## 2022-12-04 ENCOUNTER — Other Ambulatory Visit: Payer: Self-pay | Admitting: Orthopedic Surgery

## 2022-12-15 ENCOUNTER — Other Ambulatory Visit: Payer: Self-pay | Admitting: Family Medicine

## 2022-12-18 ENCOUNTER — Ambulatory Visit: Payer: No Typology Code available for payment source | Admitting: Family Medicine

## 2022-12-18 ENCOUNTER — Other Ambulatory Visit: Payer: No Typology Code available for payment source

## 2022-12-18 DIAGNOSIS — F1190 Opioid use, unspecified, uncomplicated: Secondary | ICD-10-CM

## 2022-12-20 ENCOUNTER — Other Ambulatory Visit: Payer: Self-pay | Admitting: Family Medicine

## 2022-12-20 DIAGNOSIS — I10 Essential (primary) hypertension: Secondary | ICD-10-CM

## 2022-12-21 LAB — DRUG MONITOR, PANEL 1, W/CONF, URINE
Alphahydroxyalprazolam: 112 ng/mL — ABNORMAL HIGH (ref <25)
Alphahydroxymidazolam: NEGATIVE ng/mL (ref <50)
Alphahydroxytriazolam: NEGATIVE ng/mL (ref <50)
Aminoclonazepam: NEGATIVE ng/mL (ref <25)
Amphetamines: NEGATIVE ng/mL (ref <500)
Barbiturates: NEGATIVE ng/mL (ref <300)
Benzodiazepines: POSITIVE ng/mL — AB (ref <100)
Cocaine Metabolite: NEGATIVE ng/mL (ref <150)
Codeine: NEGATIVE ng/mL (ref <50)
Creatinine: 85.8 mg/dL (ref >=20.0)
Hydrocodone: 1940 ng/mL — ABNORMAL HIGH (ref <50)
Hydromorphone: 542 ng/mL — ABNORMAL HIGH (ref <50)
Hydroxyethylflurazepam: NEGATIVE ng/mL (ref <50)
Lorazepam: NEGATIVE ng/mL (ref <50)
Marijuana Metabolite: 82 ng/mL — ABNORMAL HIGH (ref <5)
Marijuana Metabolite: POSITIVE ng/mL — AB (ref <20)
Methadone Metabolite: NEGATIVE ng/mL (ref <100)
Morphine: NEGATIVE ng/mL (ref <50)
Nordiazepam: NEGATIVE ng/mL (ref <50)
Norhydrocodone: 1847 ng/mL — ABNORMAL HIGH (ref <50)
Opiates: POSITIVE ng/mL — AB (ref <100)
Oxazepam: NEGATIVE ng/mL (ref <50)
Oxidant: NEGATIVE ug/mL (ref <200)
Oxycodone: NEGATIVE ng/mL (ref <100)
Phencyclidine: NEGATIVE ng/mL (ref <25)
Temazepam: NEGATIVE ng/mL (ref <50)
pH: 5.2 (ref 4.5–9.0)

## 2022-12-21 LAB — DM TEMPLATE

## 2022-12-24 ENCOUNTER — Other Ambulatory Visit: Payer: Self-pay | Admitting: Physician Assistant

## 2022-12-31 NOTE — Progress Notes (Deleted)
HPI: FU coronary artery disease,  hypertension, diabetes, and hyperlipidemia.  He had a drug-eluting stent to his circumflex and a drug-eluting stent to his right coronary artery in March of 2010. His LV function is normal. Abdominal ultrasound in December 2010 showed no aneurysm and no renal artery stenosis. Nuclear study November 2013 showed ejection fraction 61% and normal perfusion.  ABIs July 2020 normal.  Since I last saw him,   Current Outpatient Medications  Medication Sig Dispense Refill   acetaminophen (TYLENOL) 325 MG tablet Take 2 tablets (650 mg total) by mouth every 6 (six) hours as needed for mild pain (or Fever >/= 101).     ALPRAZolam (XANAX) 1 MG tablet TAKE 1 TABLET BY MOUTH THREE TIMES A DAY AS NEEDED FOR ANXIETY 270 tablet 1   amLODipine (NORVASC) 10 MG tablet TAKE 1 TABLET BY MOUTH EVERY DAY 90 tablet 0   Aspirin-Acetaminophen-Caffeine 500-325-65 MG PACK Take 1 Package by mouth daily as needed (pain).     cloNIDine (CATAPRES) 0.3 MG tablet TAKE 1 TABLET BY MOUTH TWICE A DAY 180 tablet 0   colchicine 0.6 MG tablet Take 1 tablet (0.6 mg total) by mouth every 6 (six) hours as needed (gout). 60 tablet 11   cyanocobalamin (,VITAMIN B-12,) 1000 MCG/ML injection INJECT 1 ML (1,000 MCG TOTAL) INTO THE MUSCLE ONCE A WEEK. 12 mL 6   doxycycline (VIBRAMYCIN) 100 MG capsule Take 1 capsule (100 mg total) by mouth 2 (two) times daily. 60 capsule 0   ezetimibe (ZETIA) 10 MG tablet TAKE 1 TABLET BY MOUTH EVERY DAY 30 tablet 11   fenofibrate 160 MG tablet TAKE 1 TABLET BY MOUTH EVERY DAY 90 tablet 0   ferrous sulfate 325 (65 FE) MG EC tablet Take 1 tablet (325 mg total) by mouth 2 (two) times daily. 60 tablet 0   fluticasone (FLONASE) 50 MCG/ACT nasal spray Place 2 sprays into both nostrils daily. 16 g 6   glipiZIDE (GLUCOTROL) 5 MG tablet TAKE 1 TABLET BY MOUTH TWICE A DAY 180 tablet 1   hydrALAZINE (APRESOLINE) 10 MG tablet TAKE 1 TABLET (10 MG TOTAL) BY MOUTH IN THE MORNING AND AT  BEDTIME. PLEASE KEEP SCHEDULED APPOINTMENT FOR FUTURE REFILLS. THANK YOU. 180 tablet 0   [START ON 01/27/2023] HYDROcodone-acetaminophen (NORCO) 10-325 MG tablet Take 1 tablet by mouth every 6 (six) hours as needed for moderate pain. 120 tablet 0   indomethacin (INDOCIN SR) 75 MG CR capsule TAKE 1 CAPSULE BY MOUTH TWICE A DAY WITH A MEAL 60 capsule 2   levothyroxine (SYNTHROID) 112 MCG tablet TAKE 1 TABLET BY MOUTH EVERY DAY 90 tablet 1   lisinopril-hydrochlorothiazide (ZESTORETIC) 20-12.5 MG tablet TAKE 1 TABLET BY MOUTH 2 (TWO) TIMES DAILY. 10AM AND 5PM 180 tablet 0   metFORMIN (GLUCOPHAGE) 1000 MG tablet TAKE 1 TABLET BY MOUTH TWO TIMES A DAY WITH A MEAL 180 tablet 1   metoprolol tartrate (LOPRESSOR) 50 MG tablet Take 1 tablet (50 mg total) by mouth 2 (two) times daily. 180 tablet 3   mupirocin ointment (BACTROBAN) 2 % Apply 1 Application topically 2 (two) times daily. 30 g 11   nitroGLYCERIN (NITROSTAT) 0.4 MG SL tablet Place 1 tablet (0.4 mg total) under the tongue every 5 (five) minutes as needed. For chest pain. 25 tablet 5   omeprazole (PRILOSEC) 40 MG capsule TAKE 1 CAPSULE (40 MG TOTAL) BY MOUTH DAILY. 90 capsule 2   ONETOUCH ULTRA test strip 1 EACH BY OTHER ROUTE DAILY. USE  AS INSTRUCTED 100 strip 1   rosuvastatin (CRESTOR) 40 MG tablet TAKE 1 TABLET BY MOUTH EVERY DAY 90 tablet 1   Syringe/Needle, Disp, (SYRINGE 3CC/25GX1") 25G X 1" 3 ML MISC 1 application by Does not apply route once a week. 50 each 5   zolpidem (AMBIEN) 10 MG tablet Take 1 tablet (10 mg total) by mouth at bedtime as needed. for sleep 90 tablet 1   No current facility-administered medications for this visit.     Past Medical History:  Diagnosis Date   Anxiety    CAD (coronary artery disease)    sees Dr. [name], normal Stress test 07-16-12    Diabetes mellitus    DJD (degenerative joint disease)    Gout    Hyperlipidemia    Hypertension    Hypothyroid    Osteoarthritis    Renal calculus    hx   Umbilical  hernia     Past Surgical History:  Procedure Laterality Date   AMPUTATION  07/17/2012   Procedure: AMPUTATION RAY;  Surgeon: [name], MD;  Location: [facility] OR;  Service: Orthopedics;  Laterality: Right;  right hallux amputation (1st ray resection )   AMPUTATION Left 03/06/2019   Procedure: Ray Amputation left foot;  Surgeon: [name], MD;  Location: [facility];  Service: Orthopedics;  Laterality: Left;   AMPUTATION Left 10/27/2021   Procedure: LEFT BELOW KNEE AMPUTATION;  Surgeon: [name], MD;  Location: [facility] OR;  Service: Orthopedics;  Laterality: Left;   CORONARY ANGIOPLASTY WITH STENT PLACEMENT     FINGER SURGERY     left index finger   I & D EXTREMITY  05/19/2012   Procedure: IRRIGATION AND DEBRIDEMENT EXTREMITY;  Surgeon: [name], MD;  Location: [facility] OR;  Service: Orthopedics;  Laterality: Right;  Right Great toe   I & D EXTREMITY Left 10/25/2021   Procedure: LEFT FOOT DEDRIDEMENT;  Surgeon: [name], MD;  Location: [facility] OR;  Service: Orthopedics;  Laterality: Left;   I & D Toe  05/19/2012   rt great toe   TONSILLECTOMY     TRANSMETATARSAL AMPUTATION Left 12/10/2020   Procedure: TRANSMETATARSAL AMPUTATION LEFT;  Surgeon: [name], MD;  Location[facility] OR;  Service: Orthopedics;  Laterality: Left;    Social History   Socioeconomic History   Marital status: Married    Spouse name: Not on file   Number of children: Not on file   Years of education: Not on file   Highest education level: Not on file  Occupational History   Not on file  Tobacco Use   Smoking status: Never   Smokeless tobacco: Current    Types: Chew   Tobacco comments:    occ  Vaping Use   Vaping Use: Never used  Substance and Sexual Activity   Alcohol use: Yes    Alcohol/week: 0.0 standard drinks of alcohol    Comment: weekends   Drug use: No   Sexual activity: Yes  Other Topics Concern   Not on file  Social History Narrative   Not on file   Social Determinants of Health    Financial Resource Strain: Not on file  Food Insecurity: Not on file  Transportation Needs: Not on file  Physical Activity: Not on file  Stress: Not on file  Social Connections: Not on file  Intimate Partner Violence: Not on file    Family History  Problem Relation Age of Onset   Heart attack Mother    Arthritis Other  Coronary artery disease Other    Diabetes Other    Hypertension Other    Prostate cancer Other    Stroke Other     ROS: no fevers or chills, productive cough, hemoptysis, dysphasia, odynophagia, melena, hematochezia, dysuria, hematuria, rash, seizure activity, orthopnea, PND, pedal edema, claudication. Remaining systems are negative.  Physical Exam: Well-developed well-nourished in no acute distress.  Skin is warm and dry.  HEENT is normal.  Neck is supple.  Chest is clear to auscultation with normal expansion.  Cardiovascular exam is regular rate and rhythm.  Abdominal exam nontender or distended. No masses palpated. Extremities show no edema. neuro grossly intact  ECG- personally reviewed  A/P  1 coronary artery disease-patient denies chest pain.  Continue aspirin and statin.  2 hyperlipidemia-  3 hypertension-patient's blood pressure is controlled.  Continue present medications and follow.  [name], MD

## 2023-01-08 ENCOUNTER — Ambulatory Visit: Payer: No Typology Code available for payment source | Admitting: Cardiology

## 2023-01-09 ENCOUNTER — Encounter: Payer: Self-pay | Admitting: Family Medicine

## 2023-01-10 ENCOUNTER — Other Ambulatory Visit: Payer: Self-pay

## 2023-01-10 MED ORDER — MUPIROCIN 2 % EX OINT: 1.0000 | TOPICAL_OINTMENT | Freq: Two times a day (BID) | CUTANEOUS | 5 refills | Status: DC

## 2023-01-28 ENCOUNTER — Other Ambulatory Visit: Payer: Self-pay

## 2023-01-28 DIAGNOSIS — I10 Essential (primary) hypertension: Secondary | ICD-10-CM

## 2023-01-28 MED ORDER — METOPROLOL TARTRATE 50 MG PO TABS: 50.0000 mg | ORAL_TABLET | Freq: Two times a day (BID) | ORAL | 0 refills | Status: DC

## 2023-02-07 NOTE — Progress Notes (Signed)
Cardiology Clinic Note   Patient Name: Robert Huang [id number] Date of Encounter: 02/15/2023  Primary Care Provider:  [name], MD Primary Cardiologist:  [name], MD  Patient Profile    62 year old male with history of CAD, DES to left circumflex, RCA in March 2010, hypertension, hyperlipidemia, type 2 diabetes, hypothyroidism, and anxiety.  Patient was admitted on 10/22/2021 with sepsis secondary to deep multiple abscesses and underwent debridement of large necrotic abscess of the left transmetatarsal amputation followed by left lower extremity trans tibial amputation on 10/27/2021.    Last seen in the office on 01/23/2022 complaining of noncardiac chest pain at which time beta-blocker was reduced in the setting of bradycardia, and now is at 50 mg twice daily.  He will remain on clonidine 0.3 mg twice daily which may also be contributing to bradycardia.  Past Medical History    Past Medical History:  Diagnosis Date   Anxiety    CAD (coronary artery disease)    sees Dr. Jens Huang, normal Stress test 07-16-12    Diabetes mellitus    DJD (degenerative joint disease)    Gout    Hyperlipidemia    Hypertension    Hypothyroid    Osteoarthritis    Renal calculus    hx   Umbilical hernia    Past Surgical History:  Procedure Laterality Date   AMPUTATION  07/17/2012   Procedure: AMPUTATION RAY;  Surgeon: [name], MD;  Location: [facility] OR;  Service: Orthopedics;  Laterality: Right;  right hallux amputation (1st ray resection )   AMPUTATION Left 03/06/2019   Procedure: Ray Amputation left foot;  Surgeon: [name], MD;  Location: [facility] ORS;  Service: Orthopedics;  Laterality: Left;   AMPUTATION Left 10/27/2021   Procedure: LEFT BELOW KNEE AMPUTATION;  Surgeon: [name], MD;  Location: [facility] OR;  Service: Orthopedics;  Laterality: Left;   CORONARY ANGIOPLASTY WITH STENT PLACEMENT     FINGER SURGERY     left index finger   I & D EXTREMITY  05/19/2012   Procedure:  IRRIGATION AND DEBRIDEMENT EXTREMITY;  Surgeon: [name], MD;  Location: [facility] OR;  Service: Orthopedics;  Laterality: Right;  Right Great toe   I & D EXTREMITY Left 10/25/2021   Procedure: LEFT FOOT DEDRIDEMENT;  Surgeon: [name], MD;  Location: [facility] OR;  Service: Orthopedics;  Laterality: Left;   I & D Toe  05/19/2012   rt great toe   TONSILLECTOMY     TRANSMETATARSAL AMPUTATION Left 12/10/2020   Procedure: TRANSMETATARSAL AMPUTATION LEFT;  Surgeon: [name], MD;  Location: [facility] OR;  Service: Orthopedics;  Laterality: Left;    Allergies  No Known Allergies  History of Present Illness    Robert Huang presents today for ongoing assessment and management of hypertension, CAD, history of sinus bradycardia with medication titration with decrease beta-blocker, and hyperlipidemia.  Seen last in the office 1 year ago.  He comes today with concerns about left arm tingling and numbness with increased fatigue.  He works from home and does a lot of computer work.  He is not very active due to left BKA and prosthesis which he has had over the last 6 months and is trying to regain his strength.  He denies chest pressure, dyspnea, or diaphoresis with associated left arm tingling.   Home Medications    Current Outpatient Medications  Medication Sig Dispense Refill   ALPRAZolam (XANAX) 1 MG tablet TAKE 1 TABLET BY MOUTH THREE TIMES A DAY  AS NEEDED FOR ANXIETY 270 tablet 1   amLODipine (NORVASC) 10 MG tablet TAKE 1 TABLET BY MOUTH EVERY DAY 90 tablet 0   Aspirin-Acetaminophen-Caffeine 500-325-65 MG PACK Take 1 Package by mouth daily as needed (pain).     cloNIDine (CATAPRES) 0.3 MG tablet TAKE 1 TABLET BY MOUTH TWICE A DAY 180 tablet 0   cyanocobalamin (,VITAMIN B-12,) 1000 MCG/ML injection INJECT 1 ML (1,000 MCG TOTAL) INTO THE MUSCLE ONCE A WEEK. 12 mL 6   ezetimibe (ZETIA) 10 MG tablet TAKE 1 TABLET BY MOUTH EVERY DAY 30 tablet 11   fenofibrate 160 MG tablet TAKE 1 TABLET BY MOUTH EVERY  DAY 90 tablet 0   ferrous sulfate 325 (65 FE) MG EC tablet Take 1 tablet (325 mg total) by mouth 2 (two) times daily. 60 tablet 0   hydrALAZINE (APRESOLINE) 10 MG tablet TAKE 1 TABLET (10 MG TOTAL) BY MOUTH IN THE MORNING AND AT BEDTIME. PLEASE KEEP SCHEDULED APPOINTMENT FOR FUTURE REFILLS. THANK YOU. 180 tablet 0   HYDROcodone-acetaminophen (NORCO) 10-325 MG tablet Take 1 tablet by mouth every 6 (six) hours as needed for moderate pain. 120 tablet 0   levothyroxine (SYNTHROID) 112 MCG tablet TAKE 1 TABLET BY MOUTH EVERY DAY 90 tablet 1   lisinopril-hydrochlorothiazide (ZESTORETIC) 20-12.5 MG tablet TAKE 1 TABLET BY MOUTH 2 (TWO) TIMES DAILY. 10AM AND 5PM 180 tablet 0   metFORMIN (GLUCOPHAGE) 1000 MG tablet TAKE 1 TABLET BY MOUTH TWO TIMES A DAY WITH A MEAL 180 tablet 1   metoprolol tartrate (LOPRESSOR) 50 MG tablet Take 1 tablet (50 mg total) by mouth 2 (two) times daily. 180 tablet 0   omeprazole (PRILOSEC) 40 MG capsule TAKE 1 CAPSULE (40 MG TOTAL) BY MOUTH DAILY. 90 capsule 2   ONETOUCH ULTRA test strip 1 EACH BY OTHER ROUTE DAILY. USE AS INSTRUCTED 100 strip 1   rosuvastatin (CRESTOR) 40 MG tablet TAKE 1 TABLET BY MOUTH EVERY DAY 90 tablet 1   Syringe/Needle, Disp, (SYRINGE 3CC/25GX1") 25G X 1" 3 ML MISC 1 application by Does not apply route once a week. 50 each 5   zolpidem (AMBIEN) 10 MG tablet Take 1 tablet (10 mg total) by mouth at bedtime as needed. for sleep 90 tablet 1   acetaminophen (TYLENOL) 325 MG tablet Take 2 tablets (650 mg total) by mouth every 6 (six) hours as needed for mild pain (or Fever >/= 101). (Patient not taking: Reported on 02/15/2023)     colchicine 0.6 MG tablet Take 1 tablet (0.6 mg total) by mouth every 6 (six) hours as needed (gout). (Patient not taking: Reported on 02/15/2023) 60 tablet 11   doxycycline (VIBRAMYCIN) 100 MG capsule Take 1 capsule (100 mg total) by mouth 2 (two) times daily. (Patient not taking: Reported on 02/15/2023) 60 capsule 0   fluticasone  (FLONASE) 50 MCG/ACT nasal spray Place 2 sprays into both nostrils daily. (Patient not taking: Reported on 02/15/2023) 16 g 6   glipiZIDE (GLUCOTROL) 5 MG tablet TAKE 1 TABLET BY MOUTH TWICE A DAY (Patient not taking: Reported on 02/15/2023) 180 tablet 1   indomethacin (INDOCIN SR) 75 MG CR capsule TAKE 1 CAPSULE BY MOUTH TWICE A DAY WITH A MEAL (Patient not taking: Reported on 02/15/2023) 60 capsule 2   mupirocin ointment (BACTROBAN) 2 % Apply 1 Application topically 2 (two) times daily. (Patient not taking: Reported on 02/15/2023) 30 g 5   nitroGLYCERIN (NITROSTAT) 0.4 MG SL tablet Place 1 tablet (0.4 mg total) under the tongue every 5 (  five) minutes as needed. For chest pain. (Patient not taking: Reported on 02/15/2023) 25 tablet 5   No current facility-administered medications for this visit.     Family History    Family History  Problem Relation Age of Onset   Heart attack Mother    Arthritis Other    Coronary artery disease Other    Diabetes Other    Hypertension Other    Prostate cancer Other    Stroke Other    He indicated that his mother is deceased. He indicated that his father is deceased. He indicated that his sister is deceased. He indicated that only one of his four brothers is alive.  Social History    Social History   Socioeconomic History   Marital status: Married    Spouse name: Not on file   Number of children: Not on file   Years of education: Not on file   Highest education level: Not on file  Occupational History   Not on file  Tobacco Use   Smoking status: Never   Smokeless tobacco: Current    Types: Chew   Tobacco comments:    Occ    Patient chew occasionally   Vaping Use   Vaping Use: Never used  Substance and Sexual Activity   Alcohol use: Yes    Alcohol/week: 0.0 standard drinks of alcohol    Comment: weekends   Drug use: No   Sexual activity: Yes  Other Topics Concern   Not on file  Social History Narrative   Not on file   Social  Determinants of Health   Financial Resource Strain: Not on file  Food Insecurity: Not on file  Transportation Needs: Not on file  Physical Activity: Not on file  Stress: Not on file  Social Connections: Not on file  Intimate Partner Violence: Not on file     Review of Systems    General:  No chills, fever, night sweats or weight changes.  Complains of generalized fatigue Cardiovascular:  No chest pain, dyspnea on exertion, edema, orthopnea, palpitations, paroxysmal nocturnal dyspnea. Dermatological: No rash, lesions/masses Respiratory: No cough, dyspnea Urologic: No hematuria, dysuria Abdominal:   No nausea, vomiting, diarrhea, bright red blood per rectum, melena, or hematemesis Neurologic:  No visual changes, wkns, changes in mental status. All other systems reviewed and are otherwise negative except as noted above.     Physical Exam    VS:  BP (!) 144/68 (BP Location: Left Arm, Patient Position: Sitting, Cuff Size: Normal)   Pulse (!) 55   Ht 6' (1.829 m)   Wt 205 lb 6.4 oz (93.2 kg)   SpO2 98%   BMI 27.86 kg/m  , BMI Body mass index is 27.86 kg/m.     GEN: Well nourished, well developed, in no acute distress. HEENT: normal. Neck: Supple, no JVD, carotid bruits, or masses. Cardiac: RRR, soft systolic murmurs, rubs, or gallops. No clubbing, cyanosis, edema.  Radials/DP/PT 2+ and equal bilaterally.  Left BKA. Respiratory:  Respirations regular and unlabored, clear to auscultation bilaterally. GI: Soft, nontender, nondistended, BS + x 4. MS: no deformity or atrophy. Skin: warm and dry, no rash. Neuro:  Strength and sensation are intact. Psych: Normal affect.  Accessory Clinical Findings    ECG personally reviewed by me today-sinus bradycardia with first-degree AV block, mild LVH.  Heart rate of 55 bpm.  PR interval 2.12 ms indicative of first-degree AV block.  Lab Results  Component Value Date   WBC 9.6 10/28/2021  HGB 9.2 (L) 10/28/2021   HCT 28.8 (L) 10/28/2021    MCV 90.0 10/28/2021   PLT 296 10/28/2021   Lab Results  Component Value Date   CREATININE 0.99 01/23/2022   BUN 19 01/23/2022   NA 140 01/23/2022   K 4.7 01/23/2022   CL 102 01/23/2022   CO2 25 01/23/2022   Lab Results  Component Value Date   ALT 15 01/23/2022   AST 18 01/23/2022   ALKPHOS 71 01/23/2022   BILITOT <0.2 01/23/2022   Lab Results  Component Value Date   CHOL 166 01/23/2022   HDL 47 01/23/2022   LDLCALC 90 01/23/2022   LDLDIRECT 88.0 09/28/2020   TRIG 168 (H) 01/23/2022   CHOLHDL 3.5 01/23/2022    Lab Results  Component Value Date   HGBA1C 6.9 (A) 10/22/2022    Review of Prior Studies: No recent cardiovascular studies.  Assessment & Plan   1.  Coronary artery disease: History of drug-eluting stent to the left circumflex and RCA in March 2010.  He is complaining of increased fatigue, left arm tingling.  Has not had any recent cardiovascular testing for ischemia.  Will plan a cardiac PET scan for diagnostic prognostic purposes due to ongoing cardiovascular risk factors of type 2 diabetes, hypertension, hyperlipidemia, and hypothyroidism.  2.  Hypertension: Blood pressure slightly elevated today in the office.  He reports that at home his blood pressures in the 130s over 60s.  He is on hydralazine 10 mg twice daily, lisinopril HCTZ, and metoprolol.  Will continue to monitor this on follow-up appointments.  Labs are completed by primary care provider Dr. Clent Ridges.  3.  Mixed hyperlipidemia: Most recent labs reveals elevated triglycerides, LDL was 88 total cholesterol 166.  He remains on rosuvastatin 40 mg daily.  Goal of LDL is less than 70.  Can consider adding Vascepa or Zetia on follow-up if remains elevated.  4.  Bradycardia: Remains on clonidine and metoprolol affecting heart rate.  There was consideration to decrease metoprolol dose further if he remains bradycardic and fatigued.  Will await cardiac PET scan results first.  Would consider decreasing metoprolol  even further to 25 mg twice daily to allow for better heart rate and hopefully less fatigue if it is warranted.       Signed, Bettey Mare. Liborio Nixon, ANP, AACC   02/15/2023 4:36 PM      Office [phone] Fax [phone]  Notice: This dictation was prepared with Dragon dictation along with smaller phrase technology. Any transcriptional errors that result from this process are unintentional and may not be corrected upon review.

## 2023-02-13 ENCOUNTER — Other Ambulatory Visit: Payer: Self-pay | Admitting: Family Medicine

## 2023-02-13 DIAGNOSIS — E785 Hyperlipidemia, unspecified: Secondary | ICD-10-CM

## 2023-02-15 ENCOUNTER — Ambulatory Visit: Payer: No Typology Code available for payment source | Attending: Cardiology | Admitting: Adult Health

## 2023-02-15 ENCOUNTER — Encounter: Payer: Self-pay | Admitting: Adult Health

## 2023-02-15 VITALS — BP 144/68 | HR 55 | Ht 72.0 in | Wt 205.4 lb

## 2023-02-15 DIAGNOSIS — I440 Atrioventricular block, first degree: Secondary | ICD-10-CM | POA: Diagnosis not present

## 2023-02-15 DIAGNOSIS — I10 Essential (primary) hypertension: Secondary | ICD-10-CM | POA: Diagnosis not present

## 2023-02-15 DIAGNOSIS — I2510 Atherosclerotic heart disease of native coronary artery without angina pectoris: Secondary | ICD-10-CM | POA: Diagnosis not present

## 2023-02-15 DIAGNOSIS — R001 Bradycardia, unspecified: Secondary | ICD-10-CM

## 2023-02-15 DIAGNOSIS — E785 Hyperlipidemia, unspecified: Secondary | ICD-10-CM

## 2023-02-15 NOTE — Patient Instructions (Signed)
Medication Instructions:  No Changes *If you need a refill on your cardiac medications before your next appointment, please call your pharmacy*   Lab Work: No labs If you have labs (blood work) drawn today and your tests are completely normal, you will receive your results only by: MyChart Message (if you have MyChart) OR A paper copy in the mail If you have any lab test that is abnormal or we need to change your treatment, we will call you to review the results.   Testing/Procedures: [address], [address]. Your physician has requested that you have an echocardiogram. Echocardiography is a painless test that uses sound waves to create images of your heart. It provides your doctor with information about the size and shape of your heart and how well your heart's chambers and valves are working. This procedure takes approximately one hour. There are no restrictions for this procedure. Please do NOT wear cologne, perfume, aftershave, or lotions (deodorant is allowed). Please arrive 15 minutes prior to your appointment time.   How to Prepare for Your Cardiac PET/CT Stress Test:  1. Please do not take these medications before your test:   Medications that may interfere with the cardiac pharmacological stress agent (ex. nitrates - including erectile dysfunction medications, isosorbide mononitrate, tamulosin or beta-blockers) the day of the exam. (Erectile dysfunction medication should be held for at least 72 hrs prior to test) Theophylline containing medications for 12 hours. Dipyridamole 48 hours prior to the test. Your remaining medications may be taken with water.  2. Nothing to eat or drink, except water, 3 hours prior to arrival time.   NO caffeine/decaffeinated products, or chocolate 12 hours prior to arrival.  3. NO perfume, cologne or lotion  4. Total time is 1 to 2 hours; you may want to bring reading material for the waiting time.  5. Please report to Radiology at  the [facility] Main Entrance 30 minutes early for your test.  [address] [place], [place] [address]  Diabetic Preparation:  Hold oral medications. You may take NPH and Lantus insulin. Do not take Humalog or Humulin R (Regular Insulin) the day of your test. Check blood sugars prior to leaving the house. If able to eat breakfast prior to 3 hour fasting, you may take all medications, including your insulin, Do not worry if you miss your breakfast dose of insulin - start at your next meal.  IF YOU THINK YOU MAY BE PREGNANT, OR ARE NURSING PLEASE INFORM THE TECHNOLOGIST.  In preparation for your appointment, medication and supplies will be purchased.  Appointment availability is limited, so if you need to cancel or reschedule, please call the Radiology Department at [phone]  24 hours in advance to avoid a cancellation fee of $100.00  What to Expect After you Arrive:  Once you arrive and check in for your appointment, you will be taken to a preparation room within the Radiology Department.  A technologist or Nurse will obtain your medical history, verify that you are correctly prepped for the exam, and explain the procedure.  Afterwards,  an IV will be started in your arm and electrodes will be placed on your skin for EKG monitoring during the stress portion of the exam. Then you will be escorted to the PET/CT scanner.  There, staff will get you positioned on the scanner and obtain a blood pressure and EKG.  During the exam, you will continue to be connected to the EKG and blood pressure machines.  A small, safe amount of a radioactive tracer will be injected in your IV to obtain a series of pictures of your heart along with an injection of a stress agent.    After your Exam:  It is recommended that you eat a meal and drink a caffeinated beverage to counter act any effects of the stress agent.  Drink plenty of fluids for the remainder of the day and urinate frequently for the  first couple of hours after the exam.  Your doctor will inform you of your test results within 7-10 business days.  For more information and frequently asked questions, please visit our website : [email or web address]  For questions about your test or how to prepare for your test, please call: Rockwell Alexandria, Cardiac Imaging Nurse Navigator  Larey Brick, Cardiac Imaging Nurse Navigator Office: [phone]   Your next appointment:   6 week(s)  Provider:   Joni Reining, DNP, ANP

## 2023-02-25 ENCOUNTER — Other Ambulatory Visit: Payer: Self-pay | Admitting: Family Medicine

## 2023-02-26 NOTE — Telephone Encounter (Signed)
Last VV was on 11/27/22 Last refill was done on 05/28/22 Please advise

## 2023-02-27 ENCOUNTER — Telehealth (INDEPENDENT_AMBULATORY_CARE_PROVIDER_SITE_OTHER): Payer: No Typology Code available for payment source | Admitting: Family Medicine

## 2023-02-27 ENCOUNTER — Encounter: Payer: Self-pay | Admitting: Family Medicine

## 2023-02-27 DIAGNOSIS — R0683 Snoring: Secondary | ICD-10-CM

## 2023-02-27 DIAGNOSIS — G4730 Sleep apnea, unspecified: Secondary | ICD-10-CM

## 2023-02-27 NOTE — Progress Notes (Signed)
Subjective:    Patient ID: Stevphen Bacigalupi [id number], male    DOB: [birth year], 62 y.o.   MRN: [id number]  HPI Virtual Visit via Video Note  I connected with the patient on 02/27/23 at  9:00 AM EDT by a video enabled telemedicine application and verified that I am speaking with the correct person using two identifiers.  Location patient: home Location provider:work or home office Persons participating in the virtual visit: patient, provider  I discussed the limitations of evaluation and management by telemedicine and the availability of in person appointments. The patient expressed understanding and agreed to proceed.   HPI: Here to ask about getting a sleep study. He snores, and he stays tired and sleepy most of the time. He lives in [place], [place] and he wants to see someone near him for this.    ROS: See pertinent positives and negatives per HPI.  Past Medical History:  Diagnosis Date   Anxiety    CAD (coronary artery disease)    sees Dr. [name], normal Stress test 07-16-12    Diabetes mellitus    DJD (degenerative joint disease)    Gout    Hyperlipidemia    Hypertension    Hypothyroid    Osteoarthritis    Renal calculus    hx   Umbilical hernia     Past Surgical History:  Procedure Laterality Date   AMPUTATION  07/17/2012   Procedure: AMPUTATION RAY;  Surgeon: [name], MD;  Location: [facility] OR;  Service: Orthopedics;  Laterality: Right;  right hallux amputation (1st ray resection )   AMPUTATION Left 03/06/2019   Procedure: Ray Amputation left foot;  Surgeon: [name], MD;  Location: [facility] ORS;  Service: Orthopedics;  Laterality: Left;   AMPUTATION Left 10/27/2021   Procedure: LEFT BELOW KNEE AMPUTATION;  Surgeon: [name], MD;  Location: [facility] OR;  Service: Orthopedics;  Laterality: Left;   CORONARY ANGIOPLASTY WITH STENT PLACEMENT     FINGER SURGERY     left index finger   I & D EXTREMITY  05/19/2012   Procedure: IRRIGATION AND DEBRIDEMENT EXTREMITY;  Surgeon:  [name], MD;  Location: [facility] OR;  Service: Orthopedics;  Laterality: Right;  Right Great toe   I & D EXTREMITY Left 10/25/2021   Procedure: LEFT FOOT DEDRIDEMENT;  Surgeon: [name], MD;  Location: [facility] OR;  Service: Orthopedics;  Laterality: Left;   I & D Toe  05/19/2012   rt great toe   TONSILLECTOMY     TRANSMETATARSAL AMPUTATION Left 12/10/2020   Procedure: TRANSMETATARSAL AMPUTATION LEFT;  Surgeon: [name], MD;  Location: [facility] OR;  Service: Orthopedics;  Laterality: Left;    Family History  Problem Relation Age of Onset   Heart attack Mother    Arthritis Other    Coronary artery disease Other    Diabetes Other    Hypertension Other    Prostate cancer Other    Stroke Other      Current Outpatient Medications:    ALPRAZolam (XANAX) 1 MG tablet, TAKE 1 TABLET BY MOUTH THREE TIMES A DAY AS NEEDED FOR ANXIETY, Disp: 270 tablet, Rfl: 1   amLODipine (NORVASC) 10 MG tablet, TAKE 1 TABLET BY MOUTH EVERY DAY, Disp: 90 tablet, Rfl: 0   Aspirin-Acetaminophen-Caffeine 500-325-65 MG PACK, Take 1 Package by mouth daily as needed (pain)., Disp: , Rfl:    cloNIDine (CATAPRES) 0.3 MG tablet, TAKE 1 TABLET BY MOUTH TWICE A DAY, Disp: 180 tablet, Rfl: 0  colchicine 0.6 MG tablet, Take 1 tablet (0.6 mg total) by mouth every 6 (six) hours as needed (gout)., Disp: 60 tablet, Rfl: 11   cyanocobalamin (,VITAMIN B-12,) 1000 MCG/ML injection, INJECT 1 ML (1,000 MCG TOTAL) INTO THE MUSCLE ONCE A WEEK., Disp: 12 mL, Rfl: 6   doxycycline (VIBRAMYCIN) 100 MG capsule, Take 1 capsule (100 mg total) by mouth 2 (two) times daily., Disp: 60 capsule, Rfl: 0   ezetimibe (ZETIA) 10 MG tablet, TAKE 1 TABLET BY MOUTH EVERY DAY, Disp: 30 tablet, Rfl: 11   fenofibrate 160 MG tablet, TAKE 1 TABLET BY MOUTH EVERY DAY, Disp: 90 tablet, Rfl: 0   fluticasone (FLONASE) 50 MCG/ACT nasal spray, Place 2 sprays into both nostrils daily., Disp: 16 g, Rfl: 6   glipiZIDE (GLUCOTROL) 5 MG tablet, TAKE 1 TABLET BY MOUTH  TWICE A DAY, Disp: 180 tablet, Rfl: 1   hydrALAZINE (APRESOLINE) 10 MG tablet, TAKE 1 TABLET (10 MG TOTAL) BY MOUTH IN THE MORNING AND AT BEDTIME. PLEASE KEEP SCHEDULED APPOINTMENT FOR FUTURE REFILLS. THANK YOU., Disp: 180 tablet, Rfl: 0   indomethacin (INDOCIN SR) 75 MG CR capsule, TAKE 1 CAPSULE BY MOUTH TWICE A DAY WITH A MEAL, Disp: 60 capsule, Rfl: 2   levothyroxine (SYNTHROID) 112 MCG tablet, TAKE 1 TABLET BY MOUTH EVERY DAY, Disp: 90 tablet, Rfl: 1   lisinopril-hydrochlorothiazide (ZESTORETIC) 20-12.5 MG tablet, TAKE 1 TABLET BY MOUTH 2 (TWO) TIMES DAILY. 10AM AND 5PM, Disp: 180 tablet, Rfl: 0   metFORMIN (GLUCOPHAGE) 1000 MG tablet, TAKE 1 TABLET BY MOUTH TWO TIMES A DAY WITH A MEAL, Disp: 180 tablet, Rfl: 1   metoprolol tartrate (LOPRESSOR) 50 MG tablet, Take 1 tablet (50 mg total) by mouth 2 (two) times daily., Disp: 180 tablet, Rfl: 0   mupirocin ointment (BACTROBAN) 2 %, Apply 1 Application topically 2 (two) times daily., Disp: 30 g, Rfl: 5   nitroGLYCERIN (NITROSTAT) 0.4 MG SL tablet, Place 1 tablet (0.4 mg total) under the tongue every 5 (five) minutes as needed. For chest pain., Disp: 25 tablet, Rfl: 5   omeprazole (PRILOSEC) 40 MG capsule, TAKE 1 CAPSULE (40 MG TOTAL) BY MOUTH DAILY., Disp: 90 capsule, Rfl: 2   ONETOUCH ULTRA test strip, 1 EACH BY OTHER ROUTE DAILY. USE AS INSTRUCTED, Disp: 100 strip, Rfl: 1   rosuvastatin (CRESTOR) 40 MG tablet, TAKE 1 TABLET BY MOUTH EVERY DAY, Disp: 90 tablet, Rfl: 1   Syringe/Needle, Disp, (SYRINGE 3CC/25GX1") 25G X 1" 3 ML MISC, 1 application by Does not apply route once a week., Disp: 50 each, Rfl: 5   zolpidem (AMBIEN) 10 MG tablet, TAKE 1 TABLET (10 MG TOTAL) BY MOUTH AT BEDTIME AS NEEDED. FOR SLEEP, Disp: 90 tablet, Rfl: 1   acetaminophen (TYLENOL) 325 MG tablet, Take 2 tablets (650 mg total) by mouth every 6 (six) hours as needed for mild pain (or Fever >/= 101). (Patient not taking: Reported on 02/15/2023), Disp:  , Rfl:    ferrous sulfate  325 (65 FE) MG EC tablet, Take 1 tablet (325 mg total) by mouth 2 (two) times daily., Disp: 60 tablet, Rfl: 0  EXAM:  VITALS per patient if applicable:  GENERAL: alert, oriented, appears well and in no acute distress  HEENT: atraumatic, conjunttiva clear, no obvious abnormalities on inspection of external nose and ears  NECK: normal movements of the head and neck  LUNGS: on inspection no signs of respiratory distress, breathing rate appears normal, no obvious gross SOB, gasping or wheezing  CV: no  obvious cyanosis  MS: moves all visible extremities without noticeable abnormality  PSYCH/NEURO: pleasant and cooperative, no obvious depression or anxiety, speech and thought processing grossly intact  ASSESSMENT AND PLAN: Possible sleep apnea. He will get [place] a name and a number for a pulmonologist in his area, and then we will put in a referral.  Gershon Crane, MD  Discussed the following assessment and plan:  No diagnosis found.     I discussed the assessment and treatment plan with the patient. The patient was provided an opportunity to ask questions and all were answered. The patient agreed with the plan and demonstrated an understanding of the instructions.   The patient was advised to call back or seek an in-person evaluation if the symptoms worsen or if the condition fails to improve as anticipated.      Review of Systems     Objective:   Physical Exam        Assessment & Plan:

## 2023-02-28 ENCOUNTER — Other Ambulatory Visit: Payer: Self-pay | Admitting: Family Medicine

## 2023-02-28 DIAGNOSIS — I10 Essential (primary) hypertension: Secondary | ICD-10-CM

## 2023-03-06 ENCOUNTER — Encounter: Payer: Self-pay | Admitting: Family Medicine

## 2023-03-06 NOTE — Telephone Encounter (Signed)
I did the referral 

## 2023-03-08 NOTE — Telephone Encounter (Signed)
Please advise. Pt last visit for pain management look like it was 11/27/2022. However, the visit is not complete. Does pt need a visit?

## 2023-03-08 NOTE — Telephone Encounter (Signed)
He needs a PMV  

## 2023-03-11 ENCOUNTER — Ambulatory Visit: Payer: No Typology Code available for payment source | Admitting: Family Medicine

## 2023-03-11 VITALS — BP 132/70 | HR 59 | Temp 98.8°F | Wt 199.4 lb

## 2023-03-11 DIAGNOSIS — N138 Other obstructive and reflux uropathy: Secondary | ICD-10-CM | POA: Diagnosis not present

## 2023-03-11 DIAGNOSIS — E118 Type 2 diabetes mellitus with unspecified complications: Secondary | ICD-10-CM | POA: Diagnosis not present

## 2023-03-11 DIAGNOSIS — E538 Deficiency of other specified B group vitamins: Secondary | ICD-10-CM

## 2023-03-11 DIAGNOSIS — D509 Iron deficiency anemia, unspecified: Secondary | ICD-10-CM

## 2023-03-11 DIAGNOSIS — N401 Enlarged prostate with lower urinary tract symptoms: Secondary | ICD-10-CM | POA: Diagnosis not present

## 2023-03-11 DIAGNOSIS — F1190 Opioid use, unspecified, uncomplicated: Secondary | ICD-10-CM | POA: Diagnosis not present

## 2023-03-11 DIAGNOSIS — G546 Phantom limb syndrome with pain: Secondary | ICD-10-CM

## 2023-03-11 DIAGNOSIS — E039 Hypothyroidism, unspecified: Secondary | ICD-10-CM

## 2023-03-11 MED ORDER — HYDROCODONE-ACETAMINOPHEN 10-325 MG PO TABS: 1.0000 | ORAL_TABLET | Freq: Four times a day (QID) | ORAL | 0 refills | Status: DC | PRN

## 2023-03-12 ENCOUNTER — Other Ambulatory Visit: Payer: Self-pay | Admitting: Family Medicine

## 2023-03-12 ENCOUNTER — Encounter: Payer: Self-pay | Admitting: Family Medicine

## 2023-03-12 LAB — IBC + FERRITIN
Ferritin: 53.1 ng/mL (ref 22.0–322.0)
Iron: 110 ug/dL (ref 42–165)
Saturation Ratios: 33.2 % (ref 20.0–50.0)
TIBC: 331.8 ug/dL (ref 250.0–450.0)
Transferrin: 237 mg/dL (ref 212.0–360.0)

## 2023-03-12 LAB — PSA: PSA: 1.87 ng/mL (ref 0.10–4.00)

## 2023-03-12 LAB — BASIC METABOLIC PANEL
BUN: 9 mg/dL (ref 6–23)
CO2: 28 mEq/L (ref 19–32)
Calcium: 9 mg/dL (ref 8.4–10.5)
Chloride: 101 mEq/L (ref 96–112)
Creatinine, Ser: 1.05 mg/dL (ref 0.40–1.50)
GFR: 75.68 mL/min (ref >60.00)
Glucose, Bld: 133 mg/dL — ABNORMAL HIGH (ref 70–99)
Potassium: 3.6 mEq/L (ref 3.5–5.1)
Sodium: 138 mEq/L (ref 135–145)

## 2023-03-12 LAB — CBC WITH DIFFERENTIAL/PLATELET
Basophils Absolute: 0 10*3/uL (ref 0.0–0.1)
Basophils Relative: 1.1 % (ref 0.0–3.0)
Eosinophils Absolute: 0.1 10*3/uL (ref 0.0–0.7)
Eosinophils Relative: 2.5 % (ref 0.0–5.0)
HCT: 39.2 % (ref 39.0–52.0)
Hemoglobin: 12.7 g/dL — ABNORMAL LOW (ref 13.0–17.0)
Lymphocytes Relative: 16.4 % (ref 12.0–46.0)
Lymphs Abs: 0.7 10*3/uL (ref 0.7–4.0)
MCHC: 32.4 g/dL (ref 30.0–36.0)
MCV: 96.7 fl (ref 78.0–100.0)
Monocytes Absolute: 0.7 10*3/uL (ref 0.1–1.0)
Monocytes Relative: 14.5 % — ABNORMAL HIGH (ref 3.0–12.0)
Neutro Abs: 2.9 10*3/uL (ref 1.4–7.7)
Neutrophils Relative %: 65.5 % (ref 43.0–77.0)
Platelets: 221 10*3/uL (ref 150.0–400.0)
RBC: 4.06 Mil/uL — ABNORMAL LOW (ref 4.22–5.81)
RDW: 15.4 % (ref 11.5–15.5)
WBC: 4.5 10*3/uL (ref 4.0–10.5)

## 2023-03-12 LAB — LIPID PANEL
Cholesterol: 154 mg/dL (ref 0–200)
HDL: 43.2 mg/dL (ref >39.00)
NonHDL: 110.44
Total CHOL/HDL Ratio: 4
Triglycerides: 297 mg/dL — ABNORMAL HIGH (ref 0.0–149.0)
VLDL: 59.4 mg/dL — ABNORMAL HIGH (ref 0.0–40.0)

## 2023-03-12 LAB — T4, FREE: Free T4: 0.83 ng/dL (ref 0.60–1.60)

## 2023-03-12 LAB — LDL CHOLESTEROL, DIRECT: Direct LDL: 77 mg/dL

## 2023-03-12 LAB — HEPATIC FUNCTION PANEL
ALT: 20 U/L (ref 0–53)
AST: 32 U/L (ref 0–37)
Albumin: 3.7 g/dL (ref 3.5–5.2)
Alkaline Phosphatase: 60 U/L (ref 39–117)
Bilirubin, Direct: 0.1 mg/dL (ref 0.0–0.3)
Total Bilirubin: 0.6 mg/dL (ref 0.2–1.2)
Total Protein: 6.6 g/dL (ref 6.0–8.3)

## 2023-03-12 LAB — FOLATE: Folate: 13.1 ng/mL (ref >5.9)

## 2023-03-12 LAB — TSH: TSH: 0.18 u[IU]/mL — ABNORMAL LOW (ref 0.35–5.50)

## 2023-03-12 LAB — T3, FREE: T3, Free: 3.7 pg/mL (ref 2.3–4.2)

## 2023-03-12 LAB — VITAMIN B12: Vitamin B-12: 837 pg/mL (ref 211–911)

## 2023-03-12 LAB — HEMOGLOBIN A1C: Hgb A1c MFr Bld: 6.8 % — ABNORMAL HIGH (ref 4.6–6.5)

## 2023-03-12 NOTE — Progress Notes (Signed)
   Subjective:    Patient ID: Robert Huang, male    DOB: 01/01/1961, 62 y.o.   MRN: [id number]  HPI Here for pain management. He is doing well overall.    Review of Systems  Constitutional: Negative.        Objective:   Physical Exam Constitutional:      Appearance: Normal appearance.  Neurological:     Mental Status: He is alert.           Assessment & Plan:  Pain management. Indication for chronic opioid: phantom limb pain Medication and dose: Norco 10-325 # pills per month: 120 Last UDS date: 12-18-22 Opioid Treatment Agreement signed (Y/N): 12-10-18 Opioid Treatment Agreement last reviewed with patient:  03-11-23 [facility] reviewed this encounter (include red flags): Yes Meds were refilled.  Gershon Crane, MD

## 2023-03-16 ENCOUNTER — Other Ambulatory Visit: Payer: Self-pay | Admitting: Nurse Practitioner

## 2023-03-17 ENCOUNTER — Other Ambulatory Visit: Payer: Self-pay | Admitting: Family Medicine

## 2023-03-17 DIAGNOSIS — I10 Essential (primary) hypertension: Secondary | ICD-10-CM

## 2023-03-22 ENCOUNTER — Ambulatory Visit (HOSPITAL_COMMUNITY): Payer: No Typology Code available for payment source | Attending: Cardiovascular Disease

## 2023-03-22 DIAGNOSIS — I10 Essential (primary) hypertension: Secondary | ICD-10-CM

## 2023-03-22 DIAGNOSIS — I2510 Atherosclerotic heart disease of native coronary artery without angina pectoris: Secondary | ICD-10-CM | POA: Diagnosis present

## 2023-03-22 LAB — ECHOCARDIOGRAM COMPLETE
Area-P 1/2: 2.77 cm2
S' Lateral: 3.2 cm

## 2023-03-24 ENCOUNTER — Other Ambulatory Visit: Payer: Self-pay | Admitting: Family Medicine

## 2023-03-25 ENCOUNTER — Telehealth: Payer: Self-pay

## 2023-03-25 NOTE — Telephone Encounter (Addendum)
Results viewed by patient via MyChart.----- Message from Joni Reining sent at 03/23/2023  5:11 PM EDT ----- I have reviewed echo report. He has normal heart pumping function, but stiffening on relaxation (grade 2 diastolic dysfunction.) will need to keep strict blood pressure control to prevent worsening of the stiffening. Awaiting PET scan and will discuss all results in the office. Follow up appointment.  KL

## 2023-03-28 NOTE — Progress Notes (Signed)
Cardiology Clinic Note   Patient Name: Robert Huang [id number] Date of Encounter: 03/29/2023  Primary Care Provider:  [name], MD Primary Cardiologist:  [name], MD  Patient Profile    62 year old male with history of CAD, DES to left circumflex, RCA in March 2010, hypertension, hyperlipidemia, type 2 diabetes, hypothyroidism, and anxiety. Patient was admitted on 10/22/2021 with sepsis secondary to deep multiple abscesses and underwent debridement of large necrotic abscess of the left transmetatarsal amputation followed by left lower extremity trans tibial amputation on 3/3/202 last in the office by me on 02/15/2023 at which time the patient was complaining of increasing fatigue.    A cardiac PET scan was ordered for diagnostic prognostic purposes in the setting of priors drug-eluting stent and ongoing CVRF.  At the time of this office visit, PET scan results are not available.  Echocardiogram was also ordered and completed on 03/22/2023, revealing normal LVEF of 60 to 65% with grade 2 diastolic dysfunction.  He was also found to have mild dilatation of the ascending aorta measuring 42 mm.    Past Medical History    Past Medical History:  Diagnosis Date   Anxiety    CAD (coronary artery disease)    sees Dr. [name], normal Stress test 07-16-12    Diabetes mellitus    DJD (degenerative joint disease)    Gout    Hyperlipidemia    Hypertension    Hypothyroid    Osteoarthritis    Renal calculus    hx   Umbilical hernia    Past Surgical History:  Procedure Laterality Date   AMPUTATION  07/17/2012   Procedure: AMPUTATION RAY;  Surgeon: [name], MD;  Location: [facility] OR;  Service: Orthopedics;  Laterality: Right;  right hallux amputation (1st ray resection )   AMPUTATION Left 03/06/2019   Procedure: Ray Amputation left foot;  Surgeon: [name], MD;  Location: [facility] ORS;  Service: Orthopedics;  Laterality: Left;   AMPUTATION Left 10/27/2021   Procedure: LEFT BELOW  KNEE AMPUTATION;  Surgeon: [name], MD;  Location: [facility] OR;  Service: Orthopedics;  Laterality: Left;   CORONARY ANGIOPLASTY WITH STENT PLACEMENT     FINGER SURGERY     left index finger   I & D EXTREMITY  05/19/2012   Procedure: IRRIGATION AND DEBRIDEMENT EXTREMITY;  Surgeon: [name], MD;  Location: [facility] OR;  Service: Orthopedics;  Laterality: Right;  Right Great toe   I & D EXTREMITY Left 10/25/2021   Procedure: LEFT FOOT DEDRIDEMENT;  Surgeon: [name], MD;  Location: [facility] OR;  Service: Orthopedics;  Laterality: Left;   I & D Toe  05/19/2012   rt great toe   TONSILLECTOMY     TRANSMETATARSAL AMPUTATION Left 12/10/2020   Procedure: TRANSMETATARSAL AMPUTATION LEFT;  Surgeon: [name], MD;  Location: [facility] OR;  Service: Orthopedics;  Laterality: Left;    Allergies  No Known Allergies  History of Present Illness    Robert Huang returns to the office today for ongoing assessment and management of coronary artery disease, hypertension, hyperlipidemia, and chronic fatigue.  Echocardiogram was ordered revealing normal LV systolic function with grade 2 diastolic dysfunction.  PET scan was ordered but not yet completed.  He comes today without any new cardiac complaints.  He is requesting explanation concerning his recent echocardiogram.  He has been contacted by [facility] to schedule cardiac PET scan.  He will be scheduled for August 22.  We have called to confirm an  appointment time, and this will be at 11 AM that day.  He continues to travel from [place] in order to come to appointments.  He denies any chest pain but he has been having left shoulder pain radiating down the left arm.  No associated diaphoresis, dizziness, dyspnea on exertion or profound fatigue.  Home Medications    Current Outpatient Medications  Medication Sig Dispense Refill   ALPRAZolam (XANAX) 1 MG tablet TAKE 1 TABLET BY MOUTH THREE TIMES A DAY AS NEEDED FOR ANXIETY 270 tablet 1    amLODipine (NORVASC) 10 MG tablet TAKE 1 TABLET BY MOUTH EVERY DAY 90 tablet 0   Aspirin-Acetaminophen-Caffeine 500-325-65 MG PACK Take 1 Package by mouth daily as needed (pain).     cloNIDine (CATAPRES) 0.3 MG tablet TAKE 1 TABLET BY MOUTH TWICE A DAY 180 tablet 0   colchicine 0.6 MG tablet Take 1 tablet (0.6 mg total) by mouth every 6 (six) hours as needed (gout). 60 tablet 11   cyanocobalamin (VITAMIN B12) 1000 MCG/ML injection INJECT 1 ML (1,000 MCG TOTAL) INTO THE MUSCLE ONCE A WEEK. 12 mL 0   ezetimibe (ZETIA) 10 MG tablet TAKE 1 TABLET BY MOUTH EVERY DAY 30 tablet 11   fenofibrate 160 MG tablet TAKE 1 TABLET BY MOUTH EVERY DAY 90 tablet 0   fluticasone (FLONASE) 50 MCG/ACT nasal spray Place 2 sprays into both nostrils daily. 16 g 6   glipiZIDE (GLUCOTROL) 5 MG tablet TAKE 1 TABLET BY MOUTH TWICE A DAY 180 tablet 1   hydrALAZINE (APRESOLINE) 10 MG tablet TAKE 1 TABLET (10 MG TOTAL) BY MOUTH IN THE MORNING AND AT BEDTIME. PLEASE KEEP SCHEDULED APPOINTMENT FOR FUTURE REFILLS. THANK YOU. 180 tablet 0   HYDROcodone-acetaminophen (NORCO) 10-325 MG tablet Take 1 tablet by mouth every 6 (six) hours as needed for moderate pain. 120 tablet 0   HYDROcodone-acetaminophen (NORCO) 10-325 MG tablet Take 1 tablet by mouth every 6 (six) hours as needed for moderate pain. 120 tablet 0   HYDROcodone-acetaminophen (NORCO) 10-325 MG tablet Take 1 tablet by mouth every 6 (six) hours as needed for moderate pain. 120 tablet 0   indomethacin (INDOCIN SR) 75 MG CR capsule TAKE 1 CAPSULE BY MOUTH TWICE A DAY WITH A MEAL (Patient taking differently: as needed.) 60 capsule 2   levothyroxine (SYNTHROID) 112 MCG tablet TAKE 1 TABLET BY MOUTH EVERY DAY 90 tablet 1   lisinopril-hydrochlorothiazide (ZESTORETIC) 20-12.5 MG tablet TAKE 1 TABLET BY MOUTH 2 (TWO) TIMES DAILY. 10AM AND 5PM 180 tablet 0   metFORMIN (GLUCOPHAGE) 1000 MG tablet TAKE 1 TABLET BY MOUTH TWO TIMES A DAY WITH A MEAL 180 tablet 1   metoprolol tartrate  (LOPRESSOR) 50 MG tablet Take 1 tablet (50 mg total) by mouth 2 (two) times daily. 180 tablet 0   mupirocin ointment (BACTROBAN) 2 % Apply 1 Application topically 2 (two) times daily. 30 g 5   nitroGLYCERIN (NITROSTAT) 0.4 MG SL tablet Place 1 tablet (0.4 mg total) under the tongue every 5 (five) minutes as needed. For chest pain. 25 tablet 5   omeprazole (PRILOSEC) 40 MG capsule TAKE 1 CAPSULE (40 MG TOTAL) BY MOUTH DAILY. 90 capsule 2   ONETOUCH ULTRA test strip 1 EACH BY OTHER ROUTE DAILY. USE AS INSTRUCTED 100 strip 1   rosuvastatin (CRESTOR) 40 MG tablet TAKE 1 TABLET BY MOUTH EVERY DAY 90 tablet 1   Syringe/Needle, Disp, (SYRINGE 3CC/25GX1") 25G X 1" 3 ML MISC 1 application by Does not apply route once  a week. 50 each 5   zolpidem (AMBIEN) 10 MG tablet TAKE 1 TABLET (10 MG TOTAL) BY MOUTH AT BEDTIME AS NEEDED. FOR SLEEP 90 tablet 1   acetaminophen (TYLENOL) 325 MG tablet Take 2 tablets (650 mg total) by mouth every 6 (six) hours as needed for mild pain (or Fever >/= 101).     ferrous sulfate 325 (65 FE) MG EC tablet Take 1 tablet (325 mg total) by mouth 2 (two) times daily. 60 tablet 0   No current facility-administered medications for this visit.     Family History    Family History  Problem Relation Age of Onset   Heart attack Mother    Arthritis Other    Coronary artery disease Other    Diabetes Other    Hypertension Other    Prostate cancer Other    Stroke Other    He indicated that his mother is deceased. He indicated that his father is deceased. He indicated that his sister is deceased. He indicated that only one of his four brothers is alive.  Social History    Social History   Socioeconomic History   Marital status: Married    Spouse name: Not on file   Number of children: Not on file   Years of education: Not on file   Highest education level: Bachelor's degree (e.g., BA, AB, BS)  Occupational History   Not on file  Tobacco Use   Smoking status: Never    Smokeless tobacco: Current    Types: Chew   Tobacco comments:    Occ    Patient chew occasionally   Vaping Use   Vaping status: Never Used  Substance and Sexual Activity   Alcohol use: Yes    Alcohol/week: 0.0 standard drinks of alcohol    Comment: weekends   Drug use: No   Sexual activity: Yes  Other Topics Concern   Not on file  Social History Narrative   Not on file   Social Determinants of Health   Financial Resource Strain: Medium Risk (03/11/2023)   Overall Financial Resource Strain (CARDIA)    Difficulty of Paying Living Expenses: Somewhat hard  Food Insecurity: Food Insecurity Present (03/11/2023)   Hunger Vital Sign    Worried About Running Out of Food in the Last Year: Sometimes true    Ran Out of Food in the Last Year: Never true  Transportation Needs: No Transportation Needs (03/11/2023)   PRAPARE - Administrator, Civil Service (Medical): No    Lack of Transportation (Non-Medical): No  Physical Activity: Unknown (03/11/2023)   Exercise Vital Sign    Days of Exercise per Week: Patient declined    Minutes of Exercise per Session: Not on file  Stress: Stress Concern Present (03/11/2023)   [employer] of Occupational Health - Occupational Stress Questionnaire    Feeling of Stress : Very much  Social Connections: Unknown (03/11/2023)   Social Connection and Isolation Panel [NHANES]    Frequency of Communication with Friends and Family: Twice a week    Frequency of Social Gatherings with Friends and Family: Patient declined    Attends Religious Services: Patient declined    Database administrator or Organizations: Patient declined    Attends Engineer, structural: Not on file    Marital Status: Married  Catering manager Violence: Not on file     Review of Systems    General:  No chills, fever, night sweats or weight changes.  Cardiovascular:  No  chest pain, dyspnea on exertion, edema, orthopnea, palpitations, paroxysmal nocturnal  dyspnea. Dermatological: No rash, lesions/masses Respiratory: No cough, dyspnea Urologic: No hematuria, dysuria Abdominal:   No nausea, vomiting, diarrhea, bright red blood per rectum, melena, or hematemesis Neurologic:  No visual changes, wkns, changes in mental status. All other systems reviewed and are otherwise negative except as noted above.       Physical Exam    VS:  BP 122/68   Pulse (!) 55   Ht 6' (1.829 m)   Wt 203 lb 9.6 oz (92.4 kg)   SpO2 99%   BMI 27.61 kg/m  , BMI Body mass index is 27.61 kg/m.     GEN: Well nourished, well developed, in no acute distress. HEENT: normal. Neck: Supple, no JVD, carotid bruits, or masses. Cardiac: RRR, no murmurs, rubs, or gallops. No clubbing, cyanosis, edema.  Radials/DP/PT 2+ and equal bilaterally.  Respiratory:  Respirations regular and unlabored, clear to auscultation bilaterally. GI: Soft, nontender, nondistended, BS + x 4. MS: no deformity or atrophy.  Left leg prostatic, AKA noted. Skin: warm and dry, no rash. Neuro:  Strength and sensation are intact. Psych: Normal affect.      Lab Results  Component Value Date   WBC 4.5 03/11/2023   HGB 12.7 (L) 03/11/2023   HCT 39.2 03/11/2023   MCV 96.7 03/11/2023   PLT 221.0 03/11/2023   Lab Results  Component Value Date   CREATININE 1.05 03/11/2023   BUN 9 03/11/2023   NA 138 03/11/2023   K 3.6 03/11/2023   CL 101 03/11/2023   CO2 28 03/11/2023   Lab Results  Component Value Date   ALT 20 03/11/2023   AST 32 03/11/2023   ALKPHOS 60 03/11/2023   BILITOT 0.6 03/11/2023   Lab Results  Component Value Date   CHOL 154 03/11/2023   HDL 43.20 03/11/2023   LDLCALC 90 01/23/2022   LDLDIRECT 77.0 03/11/2023   TRIG 297.0 (H) 03/11/2023   CHOLHDL 4 03/11/2023    Lab Results  Component Value Date   HGBA1C 6.8 (H) 03/11/2023     Review of Prior Studies Echocardiogram 03/22/2023      1. Left ventricular ejection fraction, by estimation, is 60 to 65%. The  left  ventricle has normal function. The left ventricle has no regional  wall motion abnormalities. There is moderate concentric left ventricular  hypertrophy. Left ventricular  diastolic parameters are consistent with Grade II diastolic dysfunction  (pseudonormalization).   2. Right ventricular systolic function is normal. The right ventricular  size is normal. There is normal pulmonary artery systolic pressure.   3. Left atrial size was moderately dilated.   4. The mitral valve is normal in structure. Trivial mitral valve  regurgitation. No evidence of mitral stenosis.   5. The aortic valve is tricuspid. Aortic valve regurgitation is not  visualized. No aortic stenosis is present.   6. Aortic dilatation noted. There is mild dilatation of the ascending  aorta, measuring 42 mm.   7. The inferior vena cava is dilated in size with >50% respiratory  variability, suggesting right atrial pressure of 8 mmHg.    Assessment & Plan   1.  Coronary artery disease: History of drug-eluting stent to the left circumflex, and right coronary artery in March 2010.  He continues to do well with exception of having some left shoulder pain radiating down the left arm.  The patient is scheduled for cardiac PET scan at [facility] on August 22 at 69  AM.  This will help with diagnostic prognostic purposes.  He questions what will happen to if it is abnormal.  I have explained that it may lead to a cardiac catheterization for definitive evaluation of coronary anatomy.  I have reassured him that we would cross that bridge when we come to it.  We will await results before further recommendations.  2.  Chronic diastolic heart failure: I have reviewed echocardiogram with the patient in detail.  The patient is completely euvolemic at this time.  Echocardiogram revealed grade 2 diastolic dysfunction.  He will need to continue blood pressure control to avoid worsening hypertrophy and diastolic dysfunction.  I explained this  to him and he verbalizes understanding.  He is limited to salt from his diet.  3.  Hyperlipidemia: Patient remains on statin therapy, rosuvastatin 40 mg daily.  Goal of LDL is less than 70.  Most recent labs completed July 2024 total cholesterol 154 HDL 43.2, LDL 77.0.  On follow-up from PET scan will likely increase his statin therapy, may change to atorvastatin 80 mg daily, or add Zetia.  4.  Insulin-dependent diabetes: Followed by primary care provider.  Defer.         Signed, Bettey Mare. Liborio Nixon, ANP, AACC   03/29/2023 4:31 PM      Office [phone] Fax [phone]  Notice: This dictation was prepared with Dragon dictation along with smaller phrase technology. Any transcriptional errors that result from this process are unintentional and may not be corrected upon review.

## 2023-03-29 ENCOUNTER — Ambulatory Visit: Payer: No Typology Code available for payment source | Attending: Adult Health | Admitting: Adult Health

## 2023-03-29 ENCOUNTER — Encounter: Payer: Self-pay | Admitting: Family Medicine

## 2023-03-29 ENCOUNTER — Encounter: Payer: Self-pay | Admitting: Adult Health

## 2023-03-29 ENCOUNTER — Telehealth: Payer: No Typology Code available for payment source | Admitting: Family Medicine

## 2023-03-29 VITALS — BP 122/68 | HR 55 | Ht 72.0 in | Wt 203.6 lb

## 2023-03-29 VITALS — Ht 72.0 in | Wt 199.0 lb

## 2023-03-29 DIAGNOSIS — E7800 Pure hypercholesterolemia, unspecified: Secondary | ICD-10-CM

## 2023-03-29 DIAGNOSIS — S88112D Complete traumatic amputation at level between knee and ankle, left lower leg, subsequent encounter: Secondary | ICD-10-CM | POA: Diagnosis not present

## 2023-03-29 DIAGNOSIS — I2510 Atherosclerotic heart disease of native coronary artery without angina pectoris: Secondary | ICD-10-CM

## 2023-03-29 DIAGNOSIS — I5032 Chronic diastolic (congestive) heart failure: Secondary | ICD-10-CM | POA: Diagnosis not present

## 2023-03-29 DIAGNOSIS — S88119A Complete traumatic amputation at level between knee and ankle, unspecified lower leg, initial encounter: Secondary | ICD-10-CM | POA: Insufficient documentation

## 2023-03-29 DIAGNOSIS — S88112S Complete traumatic amputation at level between knee and ankle, left lower leg, sequela: Secondary | ICD-10-CM | POA: Diagnosis not present

## 2023-03-29 NOTE — Progress Notes (Signed)
Subjective:    Patient ID: Robert Huang [id number], male    DOB: 06-Nov-1960, 62 y.o.   MRN: [id number]  HPI Virtual Visit via Video Note  I connected with the patient on 03/29/23 at 10:45 AM EDT by a video enabled telemedicine application and verified that I am speaking with the correct person using two identifiers.  Location patient: home Location provider:work or home office Persons participating in the virtual visit: patient, provider  I discussed the limitations of evaluation and management by telemedicine and the availability of in person appointments. The patient expressed understanding and agreed to proceed.   HPI: He is asking [place] to authorize refills on socks and sleeves for the prosthesis on his left leg. He gets these supplies from a supply company in [place] every 6 months. He asks if we can take over this function. He is doing well overall.    ROS: See pertinent positives and negatives per HPI.  Past Medical History:  Diagnosis Date   Anxiety    CAD (coronary artery disease)    sees Dr. [name], normal Stress test 07-16-12    Diabetes mellitus    DJD (degenerative joint disease)    Gout    Hyperlipidemia    Hypertension    Hypothyroid    Osteoarthritis    Renal calculus    hx   Umbilical hernia     Past Surgical History:  Procedure Laterality Date   AMPUTATION  07/17/2012   Procedure: AMPUTATION RAY;  Surgeon: [name], MD;  Location: [facility] OR;  Service: Orthopedics;  Laterality: Right;  right hallux amputation (1st ray resection )   AMPUTATION Left 03/06/2019   Procedure: Ray Amputation left foot;  Surgeon: [name], MD;  Location: [facility] ORS;  Service: Orthopedics;  Laterality: Left;   AMPUTATION Left 10/27/2021   Procedure: LEFT BELOW KNEE AMPUTATION;  Surgeon: [name], MD;  Location: [facility] OR;  Service: Orthopedics;  Laterality: Left;   CORONARY ANGIOPLASTY WITH STENT PLACEMENT     FINGER SURGERY     left index finger   I & D EXTREMITY  05/19/2012    Procedure: IRRIGATION AND DEBRIDEMENT EXTREMITY;  Surgeon: [name], MD;  Location: [facility] OR;  Service: Orthopedics;  Laterality: Right;  Right Great toe   I & D EXTREMITY Left 10/25/2021   Procedure: LEFT FOOT DEDRIDEMENT;  Surgeon: [name], MD;  Location: [facility] OR;  Service: Orthopedics;  Laterality: Left;   I & D Toe  05/19/2012   rt great toe   TONSILLECTOMY     TRANSMETATARSAL AMPUTATION Left 12/10/2020   Procedure: TRANSMETATARSAL AMPUTATION LEFT;  Surgeon: [name], MD;  Location: [facility] OR;  Service: Orthopedics;  Laterality: Left;    Family History  Problem Relation Age of Onset   Heart attack Mother    Arthritis Other    Coronary artery disease Other    Diabetes Other    Hypertension Other    Prostate cancer Other    Stroke Other      Current Outpatient Medications:    ALPRAZolam (XANAX) 1 MG tablet, TAKE 1 TABLET BY MOUTH THREE TIMES A DAY AS NEEDED FOR ANXIETY, Disp: 270 tablet, Rfl: 1   amLODipine (NORVASC) 10 MG tablet, TAKE 1 TABLET BY MOUTH EVERY DAY, Disp: 90 tablet, Rfl: 0   Aspirin-Acetaminophen-Caffeine 500-325-65 MG PACK, Take 1 Package by mouth daily as needed (pain)., Disp: , Rfl:    cloNIDine (CATAPRES) 0.3 MG tablet, TAKE 1 TABLET BY MOUTH TWICE  A DAY, Disp: 180 tablet, Rfl: 0   colchicine 0.6 MG tablet, Take 1 tablet (0.6 mg total) by mouth every 6 (six) hours as needed (gout)., Disp: 60 tablet, Rfl: 11   cyanocobalamin (VITAMIN B12) 1000 MCG/ML injection, INJECT 1 ML (1,000 MCG TOTAL) INTO THE MUSCLE ONCE A WEEK., Disp: 12 mL, Rfl: 0   ezetimibe (ZETIA) 10 MG tablet, TAKE 1 TABLET BY MOUTH EVERY DAY, Disp: 30 tablet, Rfl: 11   fenofibrate 160 MG tablet, TAKE 1 TABLET BY MOUTH EVERY DAY, Disp: 90 tablet, Rfl: 0   hydrALAZINE (APRESOLINE) 10 MG tablet, TAKE 1 TABLET (10 MG TOTAL) BY MOUTH IN THE MORNING AND AT BEDTIME. PLEASE KEEP SCHEDULED APPOINTMENT FOR FUTURE REFILLS. THANK YOU., Disp: 180 tablet, Rfl: 0   HYDROcodone-acetaminophen (NORCO) 10-325  MG tablet, Take 1 tablet by mouth every 6 (six) hours as needed for moderate pain., Disp: 120 tablet, Rfl: 0   HYDROcodone-acetaminophen (NORCO) 10-325 MG tablet, Take 1 tablet by mouth every 6 (six) hours as needed for moderate pain., Disp: 120 tablet, Rfl: 0   HYDROcodone-acetaminophen (NORCO) 10-325 MG tablet, Take 1 tablet by mouth every 6 (six) hours as needed for moderate pain., Disp: 120 tablet, Rfl: 0   indomethacin (INDOCIN SR) 75 MG CR capsule, TAKE 1 CAPSULE BY MOUTH TWICE A DAY WITH A MEAL (Patient taking differently: as needed.), Disp: 60 capsule, Rfl: 2   levothyroxine (SYNTHROID) 112 MCG tablet, TAKE 1 TABLET BY MOUTH EVERY DAY, Disp: 90 tablet, Rfl: 1   lisinopril-hydrochlorothiazide (ZESTORETIC) 20-12.5 MG tablet, TAKE 1 TABLET BY MOUTH 2 (TWO) TIMES DAILY. 10AM AND 5PM, Disp: 180 tablet, Rfl: 0   metFORMIN (GLUCOPHAGE) 1000 MG tablet, TAKE 1 TABLET BY MOUTH TWO TIMES A DAY WITH A MEAL, Disp: 180 tablet, Rfl: 1   metoprolol tartrate (LOPRESSOR) 50 MG tablet, Take 1 tablet (50 mg total) by mouth 2 (two) times daily., Disp: 180 tablet, Rfl: 0   mupirocin ointment (BACTROBAN) 2 %, Apply 1 Application topically 2 (two) times daily., Disp: 30 g, Rfl: 5   nitroGLYCERIN (NITROSTAT) 0.4 MG SL tablet, Place 1 tablet (0.4 mg total) under the tongue every 5 (five) minutes as needed. For chest pain., Disp: 25 tablet, Rfl: 5   omeprazole (PRILOSEC) 40 MG capsule, TAKE 1 CAPSULE (40 MG TOTAL) BY MOUTH DAILY., Disp: 90 capsule, Rfl: 2   ONETOUCH ULTRA test strip, 1 EACH BY OTHER ROUTE DAILY. USE AS INSTRUCTED, Disp: 100 strip, Rfl: 1   rosuvastatin (CRESTOR) 40 MG tablet, TAKE 1 TABLET BY MOUTH EVERY DAY, Disp: 90 tablet, Rfl: 1   Syringe/Needle, Disp, (SYRINGE 3CC/25GX1") 25G X 1" 3 ML MISC, 1 application by Does not apply route once a week., Disp: 50 each, Rfl: 5   zolpidem (AMBIEN) 10 MG tablet, TAKE 1 TABLET (10 MG TOTAL) BY MOUTH AT BEDTIME AS NEEDED. FOR SLEEP, Disp: 90 tablet, Rfl: 1    acetaminophen (TYLENOL) 325 MG tablet, Take 2 tablets (650 mg total) by mouth every 6 (six) hours as needed for mild pain (or Fever >/= 101). (Patient not taking: Reported on 02/15/2023), Disp:  , Rfl:    ferrous sulfate 325 (65 FE) MG EC tablet, Take 1 tablet (325 mg total) by mouth 2 (two) times daily., Disp: 60 tablet, Rfl: 0   fluticasone (FLONASE) 50 MCG/ACT nasal spray, Place 2 sprays into both nostrils daily. (Patient not taking: Reported on 03/29/2023), Disp: 16 g, Rfl: 6   glipiZIDE (GLUCOTROL) 5 MG tablet, TAKE 1 TABLET BY MOUTH  TWICE A DAY (Patient not taking: Reported on 03/29/2023), Disp: 180 tablet, Rfl: 1  EXAM:  VITALS per patient if applicable:  GENERAL: alert, oriented, appears well and in no acute distress  HEENT: atraumatic, conjunttiva clear, no obvious abnormalities on inspection of external nose and ears  NECK: normal movements of the head and neck  LUNGS: on inspection no signs of respiratory distress, breathing rate appears normal, no obvious gross SOB, gasping or wheezing  CV: no obvious cyanosis  MS: moves all visible extremities without noticeable abnormality  PSYCH/NEURO: pleasant and cooperative, no obvious depression or anxiety, speech and thought processing grossly intact  ASSESSMENT AND PLAN: He is S/P a left below the knee amoputation. We will write a RX for socks and sleeves and will fax this to [place] (fax [phone] and phone [phone]).  Gershon Crane, MD  Discussed the following assessment and plan:  No diagnosis found.     I discussed the assessment and treatment plan with the patient. The patient was provided an opportunity to ask questions and all were answered. The patient agreed with the plan and demonstrated an understanding of the instructions.   The patient was advised to call back or seek an in-person evaluation if the symptoms worsen or if the condition fails to improve as anticipated.      Review of  Systems     Objective:   Physical Exam        Assessment & Plan:

## 2023-03-29 NOTE — Patient Instructions (Signed)
Medication Instructions:  No Changes *If you need a refill on your cardiac medications before your next appointment, please call your pharmacy*   Lab Work: No Labs If you have labs (blood work) drawn today and your tests are completely normal, you will receive your results only by: [place] (if you have MyChart) OR A paper copy in the mail If you have any lab test that is abnormal or we need to change your treatment, we will call you to review the results.   Testing/Procedures: No Testing   Follow-Up: At [facility], you and your health needs are our priority.  As part of our continuing mission to provide you with exceptional heart care, we have created designated Provider Care Teams.  These Care Teams include your primary Cardiologist (physician) and Advanced Practice Providers (APPs -  Physician Assistants and Nurse Practitioners) who all work together to provide you with the care you need, when you need it.  We recommend signing up for the patient portal called "MyChart".  Sign up information is provided on this After Visit Summary.  MyChart is used to connect with patients for Virtual Visits (Telemedicine).  Patients are able to view lab/test results, encounter notes, upcoming appointments, etc.  Non-urgent messages can be sent to your provider as well.   To learn more about what you can do with MyChart, go to [email or web address].    Your next appointment:   3 week(s)  Provider:   Jory Sims, DNP, ANP

## 2023-03-30 ENCOUNTER — Other Ambulatory Visit: Payer: Self-pay | Admitting: Family Medicine

## 2023-04-17 ENCOUNTER — Encounter (HOSPITAL_COMMUNITY): Payer: Self-pay

## 2023-04-17 ENCOUNTER — Telehealth (HOSPITAL_COMMUNITY): Payer: Self-pay | Admitting: Emergency Medicine

## 2023-04-17 NOTE — Telephone Encounter (Signed)
Reaching out to patient to offer assistance regarding upcoming cardiac imaging study; pt verbalizes understanding of appt date/time, parking situation and where to check in, pre-test NPO status and medications ordered, and verified current allergies; name and call back number provided for further questions should they arise Sara Wallace RN Navigator Cardiac Imaging [facility] [phone] office [phone] cell 

## 2023-04-18 ENCOUNTER — Ambulatory Visit
Admission: RE | Admit: 2023-04-18 | Discharge: 2023-04-18 | Disposition: A | Discharge status: Home / Self Care | Payer: No Typology Code available for payment source | Source: Ambulatory Visit | Attending: Adult Health | Admitting: Adult Health

## 2023-04-18 DIAGNOSIS — K760 Fatty (change of) liver, not elsewhere classified: Secondary | ICD-10-CM | POA: Diagnosis not present

## 2023-04-18 DIAGNOSIS — I700 Atherosclerosis of aorta: Secondary | ICD-10-CM | POA: Insufficient documentation

## 2023-04-18 DIAGNOSIS — I2510 Atherosclerotic heart disease of native coronary artery without angina pectoris: Secondary | ICD-10-CM | POA: Insufficient documentation

## 2023-04-18 DIAGNOSIS — I10 Essential (primary) hypertension: Secondary | ICD-10-CM | POA: Diagnosis not present

## 2023-04-18 LAB — NM PET CT CARDIAC PERFUSION MULTI W/ABSOLUTE BLOODFLOW
MBFR: 1.7
Nuc Rest EF: 55 %
Nuc Stress EF: 57 %
Peak HR: 88 {beats}/min
Rest HR: 63 {beats}/min
Rest MBF: 1.19 ml/g/min
Rest Nuclear Isotope Dose: 23.8 mCi
SRS: 0
SSS: 2
ST Depression (mm): 0 mm
Stress MBF: 2.02 ml/g/min
Stress Nuclear Isotope Dose: 23.9 mCi
TID: 1.12

## 2023-04-18 MED ORDER — RUBIDIUM RB82 GENERATOR (RUBYFILL)
25.0000 | PACK | Freq: Once | INTRAVENOUS | Status: AC
  Administered 2023-04-18: 23.91 via INTRAVENOUS

## 2023-04-18 MED ORDER — RUBIDIUM RB82 GENERATOR (RUBYFILL)
25.0000 | PACK | Freq: Once | INTRAVENOUS | Status: AC
  Administered 2023-04-18: 23.84 via INTRAVENOUS

## 2023-04-18 MED ORDER — REGADENOSON 0.4 MG/5ML IV SOLN
0.4000 mg | Freq: Once | INTRAVENOUS | Status: AC
  Administered 2023-04-18: 0.4 mg via INTRAVENOUS
  Filled 2023-04-18: qty 5

## 2023-04-18 NOTE — Progress Notes (Signed)
Pt tolerated exam without incident; vital signs within normal limits; pt denies lightheadedness or dizziness; encouraged to drink caffeine, eat meal; pt ambulatory to lobby steady gait noted

## 2023-04-24 ENCOUNTER — Other Ambulatory Visit: Payer: Self-pay | Admitting: Cardiology

## 2023-04-24 NOTE — Progress Notes (Signed)
Virtual Visit via Video Note   Because of Robert Huang's co-morbid illnesses, he is at least at moderate risk for complications without adequate follow up.  This format is felt to be most appropriate for this patient at this time.  All issues noted in this document were discussed and addressed.  A limited physical exam was performed with this format.  Please refer to the patient's chart for his consent to telehealth for [facility].      Date:  04/24/2023   ID:  [name], DOB 1961/01/17, MRN [id number]  Patient Location: Home Provider Location: Office/Clinic  PCP:  [name], MD  Cardiologist:  [name], MD  Electrophysiologist:  None   Evaluation Performed:  Follow-Up Visit  Chief Complaint:  Chest pain   History of Present Illness:    Robert Huang is a 62 y.o. male with history of coronary artery disease, drug-eluting stents to left circumflex, and RCA in March 2010, hypertension, hyperlipidemia, type 2 diabetes, hypothyroidism, and anxiety.  On last office visit the patient was complaining of increasing fatigue.  The patient did have a cardiac PET scan ordered.  Which were completed at [facility].  PET scan revealed findings consistent with ischemia small mid inferior ischemia with RCA stress flow.  The study was intermediate risk, LV perfusion was abnormal with evidence of ischemia.  There was a small defect with moderate reduction in uptake present in the mid inferior locations that was reversible.  Of note, he did have diffusely scattered groundglass opacities particularly notable in the left upper load which was nonspecific for infections or inflammatory disease along with evidence of hepatic stenosis on  scan oh review prior to the PET scan.  We discussed the need to proceed with cardiac catheterization.  I have spoken with Dr. [name].  He is okay with proceeding with cardiac catheterization if the patient continues to  be symptomatic and is concerned about his symptoms.  Dr. [name] felt it was low risk  The patient does not have symptoms concerning for COVID-19 infection (fever, chills, cough, or new shortness of breath).    Past Medical History:  Diagnosis Date   Anxiety    CAD (coronary artery disease)    sees Dr. [name], normal Stress test 07-16-12    Diabetes mellitus    DJD (degenerative joint disease)    Gout    Hyperlipidemia    Hypertension    Hypothyroid    Osteoarthritis    Renal calculus    hx   Umbilical hernia    Past Surgical History:  Procedure Laterality Date   AMPUTATION  07/17/2012   Procedure: AMPUTATION RAY;  Surgeon: [name], MD;  Location: [facility] OR;  Service: Orthopedics;  Laterality: Right;  right hallux amputation (1st ray resection )   AMPUTATION Left 03/06/2019   Procedure: Ray Amputation left foot;  Surgeon: [name], MD;  Location: [facility] ORS;  Service: Orthopedics;  Laterality: Left;   AMPUTATION Left 10/27/2021   Procedure: LEFT BELOW KNEE AMPUTATION;  Surgeon: [name], MD;  Location: [facility] OR;  Service: Orthopedics;  Laterality: Left;   CORONARY ANGIOPLASTY WITH STENT PLACEMENT     FINGER SURGERY     left index finger   I & D EXTREMITY  05/19/2012   Procedure: IRRIGATION AND DEBRIDEMENT EXTREMITY;  Surgeon: [name], MD;  Location: [facility] OR;  Service: Orthopedics;  Laterality: Right;  Right Great toe   I & D EXTREMITY Left 10/25/2021  Procedure: LEFT FOOT DEDRIDEMENT;  Surgeon: [name], MD;  Location: [facility] OR;  Service: Orthopedics;  Laterality: Left;   I & D Toe  05/19/2012   rt great toe   TONSILLECTOMY     TRANSMETATARSAL AMPUTATION Left 12/10/2020   Procedure: TRANSMETATARSAL AMPUTATION LEFT;  Surgeon: [name], MD;  Location[facility] OR;  Service: Orthopedics;  Laterality: Left;     No outpatient medications have been marked as taking for the 04/26/23 encounter (Appointment) with Jodelle Gross, NP.     Allergies:   Patient  has no known allergies.   Social History   Tobacco Use   Smoking status: Never   Smokeless tobacco: Current    Types: Chew   Tobacco comments:    Occ    Patient chew occasionally   Vaping Use   Vaping status: Never Used  Substance Use Topics   Alcohol use: Yes    Alcohol/week: 0.0 standard drinks of alcohol    Comment: weekends   Drug use: No     Family Hx: The patient's family history includes Arthritis in an other family member; Coronary artery disease in an other family member; Diabetes in an other family member; Heart attack in his mother; Hypertension in an other family member; Prostate cancer in an other family member; Stroke in an other family member.  ROS:   Please see the history of present illness.    All other systems reviewed and are negative.   Prior CV studies:   The following studies were reviewed today:  Cardiac PET Scan 04/18/2023  Findings are consistent with ischemia: small mid inferior ischemia with RCA stress flow of 1.8 ml/g/min (mildly decreased). The study is intermediate risk.   LV perfusion is abnormal. There is evidence of ischemia. Defect 1: There is a small defect with moderate reduction in uptake present in the mid inferior location(s) that is reversible. There is normal wall motion in the defect area. Consistent with ischemia.   End diastolic cavity size is mildly enlarged. End systolic cavity size is mildly enlarged.   Myocardial blood flow was computed to be 1.35ml/g/min at rest and 2.4ml/g/min at stress. Global myocardial blood flow reserve was 1.70 and was mildly abnormal.   Coronary calcium assessment not performed due to prior revascularization. Aortic atherosclerosis.   Electronically Signed  By: Riley Lam M.D.  Labs/Other Tests and Data Reviewed:    EKG:  No ECG reviewed.  Recent Labs: 03/11/2023: ALT 20; BUN 9; Creatinine, Ser 1.05; Hemoglobin 12.7; Platelets 221.0; Potassium 3.6; Sodium 138; TSH 0.18   Recent Lipid  Panel Lab Results  Component Value Date/Time   CHOL 154 03/11/2023 03:54 PM   CHOL 166 01/23/2022 12:25 PM   TRIG 297.0 (H) 03/11/2023 03:54 PM   HDL 43.20 03/11/2023 03:54 PM   HDL 47 01/23/2022 12:25 PM   CHOLHDL 4 03/11/2023 03:54 PM   LDLCALC 90 01/23/2022 12:25 PM   LDLDIRECT 77.0 03/11/2023 03:54 PM    Wt Readings from Last 3 Encounters:  03/29/23 203 lb 9.6 oz (92.4 kg)  03/29/23 199 lb (90.3 kg)  03/11/23 199 lb 6.4 oz (90.4 kg)     Objective:    Vital Signs:  There were no vitals taken for this visit.    ASSESSMENT & PLAN:    Coronary artery disease: History of drug-eluting stent to the left circumflex and right coronary artery in March 2010.  The patient has been experiencing worsening dyspnea.  Nuclear medicine stress test revealed some reversible  ischemia in the mid inferior RCA distribution with LV perfusion abnormal.  There was normal wall motion in the defect.  Consistent with ischemia.  We have discussed moving forward with cardiac catheterization versus medical management.  He wishes to try medical management first believing that he would likely end up moving forward with cardiac catheterization.  I will start him on isosorbide 15 mg daily he is to take a couple Tylenol with them in order to help to suppress headache which may be associated.  I explained this to him.  We will revisit this in 1 month.  If he is not feeling any better we will likely move onto cardiac catheterization.  2.  Hypertension: On previous office visit his blood pressure was controlled.  I will not make any changes in his regimen at this time.  He may have slightly lower blood pressure with addition of nitrates.  Continue amlodipine 10 mg daily, hydralazine 10 mg twice daily, lisinopril HCTZ twice a day metoprolol tartrate 50 mg twice daily.  If necessary we may need to adjust if he is tolerating the isosorbide.  3.  Hyperlipidemia: Goal of LDL less than 70.  Remains on Zetia, and rosuvastatin.   Follow-up lipids and LFTs are completed annually.  4.  Abnormal CT scan prior to PET.  This revealed groundglass opacities especially in the left upper lobe.  He will need to follow-up with PCP and/or pulmonology for further evaluation and testing.  Time:   Today, I have spent 20 minutes with the patient with telehealth technology discussing the above problems.     Medication Adjustments/Labs and Tests Ordered: Current medicines are reviewed at length with the patient today.  Concerns regarding medicines are outlined above.   Tests Ordered: No orders of the defined types were placed in this encounter.   Medication Changes: Addition of isosorbide mononitrate 15 mg daily.  Disposition:  Follow up in 1 month(s) virtual visit.  Signed, Bettey Mare. Liborio Nixon, ANP, AACC  04/24/2023 3:34 PM    [facility]

## 2023-04-26 ENCOUNTER — Encounter: Payer: Self-pay | Admitting: Adult Health

## 2023-04-26 ENCOUNTER — Ambulatory Visit: Payer: No Typology Code available for payment source | Attending: Cardiology | Admitting: Adult Health

## 2023-04-26 VITALS — BP 132/66 | HR 59 | Ht 72.0 in | Wt 200.0 lb

## 2023-04-26 DIAGNOSIS — F411 Generalized anxiety disorder: Secondary | ICD-10-CM

## 2023-04-26 DIAGNOSIS — E7800 Pure hypercholesterolemia, unspecified: Secondary | ICD-10-CM

## 2023-04-26 DIAGNOSIS — R0609 Other forms of dyspnea: Secondary | ICD-10-CM

## 2023-04-26 DIAGNOSIS — I2510 Atherosclerotic heart disease of native coronary artery without angina pectoris: Secondary | ICD-10-CM

## 2023-04-26 DIAGNOSIS — I10 Essential (primary) hypertension: Secondary | ICD-10-CM

## 2023-04-26 MED ORDER — ISOSORBIDE MONONITRATE ER 30 MG PO TB24: 15.0000 mg | ORAL_TABLET | Freq: Every day | ORAL | 1 refills | Status: DC

## 2023-04-26 NOTE — Patient Instructions (Signed)
Medication Instructions:  Start Imdur 15 mg (  Take 1/2 of 30 mg  Tablet Daily). *If you need a refill on your cardiac medications before your next appointment, please call your pharmacy*   Lab Work: No Labs If you have labs (blood work) drawn today and your tests are completely normal, you will receive your results only by: MyChart Message (if you have MyChart) OR A paper copy in the mail If you have any lab test that is abnormal or we need to change your treatment, we will call you to review the results.   Testing/Procedures: No Testing   Follow-Up: At [facility], you and your health needs are our priority.  As part of our continuing mission to provide you with exceptional heart care, we have created designated Provider Care Teams.  These Care Teams include your primary Cardiologist (physician) and Advanced Practice Providers (APPs -  Physician Assistants and Nurse Practitioners) who all work together to provide you with the care you need, when you need it.  We recommend signing up for the patient portal called "MyChart".  Sign up information is provided on this After Visit Summary.  MyChart is used to connect with patients for Virtual Visits (Telemedicine).  Patients are able to view lab/test results, encounter notes, upcoming appointments, etc.  Non-urgent messages can be sent to your provider as well.   To learn more about what you can do with MyChart, go to [email or web address].    Your next appointment:   1 month(s)  Provider:   Joni Reining, DNP, ANP

## 2023-05-13 NOTE — Progress Notes (Unsigned)
Virtual Visit via Video Note   Because of Robert Huang's co-morbid illnesses, he is at least at moderate risk for complications without adequate follow up.  This format is felt to be most appropriate for this patient at this time.  All issues noted in this document were discussed and addressed.  A limited physical exam was performed with this format.  Please refer to the patient's chart for his consent to telehealth for [facility].      Date:  05/16/2023   ID:  Robert Huang, DOB August 17, 1961, MRN [id number]  Patient Location: Home Provider Location: Office/Clinic  PCP:  [name], MD  Cardiologist:  [name], MD  Electrophysiologist:  None   Evaluation Performed:  Follow-Up Visit  Chief Complaint: Chest pressure and dyspnea on exertion  History of Present Illness:    Robert Huang is a 62 y.o. male with history of CAD, DES to left circumflex, RCA in March 2010, hypertension, hyperlipidemia, type 2 diabetes, hypothyroidism, and anxiety. Patient was admitted on 10/22/2021 with sepsis secondary to deep multiple abscesses and underwent debridement of large necrotic abscess of the left transmetatarsal amputation followed by left lower extremity trans tibial amputation on 3/3/202 last in the office by me on 02/15/2023 at which time the patient was complaining of increasing fatigue.     A cardiac PET scan was ordered for diagnostic prognostic purposes in the setting of priors drug-eluting stent and ongoing CVRF.  At the time of this office visit, PET scan results are not available.  Echocardiogram was also ordered and completed on 03/22/2023, revealing normal LVEF of 60 to 65% with grade 2 diastolic dysfunction.  He was also found to have mild dilatation of the ascending aorta measuring 42 mm.   We have discussed moving forward with cardiac catheterization versus medical management.  He wishes to try medical management first believing that he would likely  end up moving forward with cardiac catheterization.  I will start him on isosorbide 15 mg daily he is to take a couple Tylenol with them in order to help to suppress headache which may be associated.  I explained this to him.  We will revisit this in 1 month.  If he is not feeling any better we will likely move onto cardiac catheterization.  He continues to have some chest pressure, and dyspnea on exertion despite use of nitrates.  His energy level has decreased.  I have spoken with Dr. Jens Huang previous to this appointment and the patient is okay to move forward with cardiac catheterization if he remains symptomatic.   The patient does not have symptoms concerning for COVID-19 infection (fever, chills, cough, or new shortness of breath).    Past Medical History:  Diagnosis Date   Anxiety    CAD (coronary artery disease)    sees Dr. Jens Huang, normal Stress test 07-16-12    Diabetes mellitus    DJD (degenerative joint disease)    Gout    Hyperlipidemia    Hypertension    Hypothyroid    Osteoarthritis    Renal calculus    hx   Umbilical hernia    Past Surgical History:  Procedure Laterality Date   AMPUTATION  07/17/2012   Procedure: AMPUTATION RAY;  Surgeon: [name], MD;  Location: [facility] OR;  Service: Orthopedics;  Laterality: Right;  right hallux amputation (1st ray resection )   AMPUTATION Left 03/06/2019   Procedure: Ray Amputation left foot;  Surgeon: [name], MD;  Location:  [facility] ORS;  Service: Orthopedics;  Laterality: Left;   AMPUTATION Left 10/27/2021   Procedure: LEFT BELOW KNEE AMPUTATION;  Surgeon: [name], MD;  Location: [facility] OR;  Service: Orthopedics;  Laterality: Left;   CORONARY ANGIOPLASTY WITH STENT PLACEMENT     FINGER SURGERY     left index finger   I & D EXTREMITY  05/19/2012   Procedure: IRRIGATION AND DEBRIDEMENT EXTREMITY;  Surgeon: [name], MD;  Location: [facility] OR;  Service: Orthopedics;  Laterality: Right;  Right Great toe   I & D EXTREMITY  Left 10/25/2021   Procedure: LEFT FOOT DEDRIDEMENT;  Surgeon: [name], MD;  Location: [facility] OR;  Service: Orthopedics;  Laterality: Left;   I & D Toe  05/19/2012   rt great toe   TONSILLECTOMY     TRANSMETATARSAL AMPUTATION Left 12/10/2020   Procedure: TRANSMETATARSAL AMPUTATION LEFT;  Surgeon: [name], MD;  Location: [facility] OR;  Service: Orthopedics;  Laterality: Left;     Current Meds  Medication Sig   acetaminophen (TYLENOL) 325 MG tablet Take 2 tablets (650 mg total) by mouth every 6 (six) hours as needed for mild pain (or Fever >/= 101).   ALPRAZolam (XANAX) 1 MG tablet TAKE 1 TABLET BY MOUTH THREE TIMES A DAY AS NEEDED FOR ANXIETY   amLODipine (NORVASC) 10 MG tablet TAKE 1 TABLET BY MOUTH EVERY DAY   Aspirin-Acetaminophen-Caffeine 500-325-65 MG PACK Take 1 Package by mouth daily as needed (pain).   cloNIDine (CATAPRES) 0.3 MG tablet TAKE 1 TABLET BY MOUTH TWICE A DAY   colchicine 0.6 MG tablet Take 1 tablet (0.6 mg total) by mouth every 6 (six) hours as needed (gout).   cyanocobalamin (VITAMIN B12) 1000 MCG/ML injection INJECT 1 ML (1,000 MCG TOTAL) INTO THE MUSCLE ONCE A WEEK.   ezetimibe (ZETIA) 10 MG tablet TAKE 1 TABLET BY MOUTH EVERY DAY   fenofibrate 160 MG tablet TAKE 1 TABLET BY MOUTH EVERY DAY   fluticasone (FLONASE) 50 MCG/ACT nasal spray Place 2 sprays into both nostrils daily.   glipiZIDE (GLUCOTROL) 5 MG tablet TAKE 1 TABLET BY MOUTH TWICE A DAY   hydrALAZINE (APRESOLINE) 10 MG tablet TAKE 1 TABLET (10 MG TOTAL) BY MOUTH IN THE MORNING AND AT BEDTIME. PLEASE KEEP SCHEDULED APPOINTMENT FOR FUTURE REFILLS. THANK YOU.   HYDROcodone-acetaminophen (NORCO) 10-325 MG tablet Take 1 tablet by mouth every 6 (six) hours as needed for moderate pain.   indomethacin (INDOCIN SR) 75 MG CR capsule TAKE 1 CAPSULE BY MOUTH TWICE A DAY WITH A MEAL (Patient taking differently: as needed.)   isosorbide mononitrate (IMDUR) 30 MG 24 hr tablet Take 0.5 tablets (15 mg total) by mouth daily.    levothyroxine (SYNTHROID) 112 MCG tablet TAKE 1 TABLET BY MOUTH EVERY DAY   lisinopril-hydrochlorothiazide (ZESTORETIC) 20-12.5 MG tablet TAKE 1 TABLET BY MOUTH 2 (TWO) TIMES DAILY. 10AM AND 5PM   metFORMIN (GLUCOPHAGE) 1000 MG tablet TAKE 1 TABLET BY MOUTH TWO TIMES A DAY WITH A MEAL   metoprolol tartrate (LOPRESSOR) 50 MG tablet TAKE 1 TABLET BY MOUTH TWICE A DAY   mupirocin ointment (BACTROBAN) 2 % Apply 1 Application topically 2 (two) times daily.   nitroGLYCERIN (NITROSTAT) 0.4 MG SL tablet Place 1 tablet (0.4 mg total) under the tongue every 5 (five) minutes as needed. For chest pain.   omeprazole (PRILOSEC) 40 MG capsule TAKE 1 CAPSULE (40 MG TOTAL) BY MOUTH DAILY.   ONETOUCH ULTRA test strip 1 EACH BY OTHER ROUTE DAILY.  USE AS INSTRUCTED   rosuvastatin (CRESTOR) 40 MG tablet TAKE 1 TABLET BY MOUTH EVERY DAY   Syringe/Needle, Disp, (SYRINGE 3CC/25GX1") 25G X 1" 3 ML MISC 1 application by Does not apply route once a week.   zolpidem (AMBIEN) 10 MG tablet TAKE 1 TABLET (10 MG TOTAL) BY MOUTH AT BEDTIME AS NEEDED. FOR SLEEP   [DISCONTINUED] HYDROcodone-acetaminophen (NORCO) 10-325 MG tablet Take 1 tablet by mouth every 6 (six) hours as needed for moderate pain.   [DISCONTINUED] HYDROcodone-acetaminophen (NORCO) 10-325 MG tablet Take 1 tablet by mouth every 6 (six) hours as needed for moderate pain.     Allergies:   Patient has no known allergies.   Social History   Tobacco Use   Smoking status: Never   Smokeless tobacco: Current    Types: Chew   Tobacco comments:    Occ    Patient chew occasionally   Vaping Use   Vaping status: Never Used  Substance Use Topics   Alcohol use: Yes    Alcohol/week: 0.0 standard drinks of alcohol    Comment: weekends   Drug use: No     Family Hx: The patient's family history includes Arthritis in an other family member; Coronary artery disease in an other family member; Diabetes in an other family member; Heart attack in his mother; Hypertension  in an other family member; Prostate cancer in an other family member; Stroke in an other family member.  ROS:   Please see the history of present illness.     All other systems reviewed and are negative.   Prior CV studies:   The following studies were reviewed today:  NM PET 04/18/2023 Findings are consistent with ischemia: small mid inferior ischemia with RCA stress flow of 1.8 ml/g/min (mildly decreased). The study is intermediate risk.   LV perfusion is abnormal. There is evidence of ischemia. Defect 1: There is a small defect with moderate reduction in uptake present in the mid inferior location(s) that is reversible. There is normal wall motion in the defect area. Consistent with ischemia.   End diastolic cavity size is mildly enlarged. End systolic cavity size is mildly enlarged.   Myocardial blood flow was computed to be 1.8ml/g/min at rest and 2.98ml/g/min at stress. Global myocardial blood flow reserve was 1.70 and was mildly abnormal.   Coronary calcium assessment not performed due to prior revascularization. Aortic atherosclerosis.   Electronically Signed  By: Riley Lam M.D.  Echocardiogram 03/22/2023      1. Left ventricular ejection fraction, by estimation, is 60 to 65%. The  left ventricle has normal function. The left ventricle has no regional  wall motion abnormalities. There is moderate concentric left ventricular  hypertrophy. Left ventricular  diastolic parameters are consistent with Grade II diastolic dysfunction  (pseudonormalization).   2. Right ventricular systolic function is normal. The right ventricular  size is normal. There is normal pulmonary artery systolic pressure.   3. Left atrial size was moderately dilated.   4. The mitral valve is normal in structure. Trivial mitral valve  regurgitation. No evidence of mitral stenosis.   5. The aortic valve is tricuspid. Aortic valve regurgitation is not  visualized. No aortic stenosis is present.   6.  Aortic dilatation noted. There is mild dilatation of the ascending  aorta, measuring 42 mm.   7. The inferior vena cava is dilated in size with >50% respiratory  variability, suggesting right atrial pressure of 8 mmHg.   Labs/Other Tests and Data Reviewed:  EKG:  No ECG reviewed.  EKG will be completed prior to cardiac catheterization.  Most recent EKG reviewed sinus bradycardia with first-degree AV block, LVH is noted heart rate of 55 bpm.  Recent Labs: 03/11/2023: ALT 20; BUN 9; Creatinine, Ser 1.05; Hemoglobin 12.7; Platelets 221.0; Potassium 3.6; Sodium 138; TSH 0.18   Recent Lipid Panel Lab Results  Component Value Date/Time   CHOL 154 03/11/2023 03:54 PM   CHOL 166 01/23/2022 12:25 PM   TRIG 297.0 (H) 03/11/2023 03:54 PM   HDL 43.20 03/11/2023 03:54 PM   HDL 47 01/23/2022 12:25 PM   CHOLHDL 4 03/11/2023 03:54 PM   LDLCALC 90 01/23/2022 12:25 PM   LDLDIRECT 77.0 03/11/2023 03:54 PM    Wt Readings from Last 3 Encounters:  05/16/23 200 lb (90.7 kg)  04/26/23 200 lb (90.7 kg)  03/29/23 203 lb 9.6 oz (92.4 kg)     Objective:    Vital Signs:  BP 118/67 (BP Location: Left Arm, Patient Position: Sitting, Cuff Size: Normal)   Pulse (!) 52   Ht 6' (1.829 m)   Wt 200 lb (90.7 kg)   SpO2 98%   BMI 27.12 kg/m    VITAL SIGNS:  reviewed GEN:  no acute distress RESPIRATORY:  normal respiratory effort, symmetric expansion CARDIOVASCULAR:  no peripheral edema PSYCH:  normal affect  ASSESSMENT & PLAN:    Coronary artery disease, History of drug-eluting stent to the left circumflex and RCA in March 2010.  He has had an abnormal PET scan as described above with reversible ischemia in the RCA distribution.  He is symptomatic with dyspnea on exertion, chest pressure, and profound fatigue.  EKG revealed sinus bradycardia with first-degree AV block. Due to symptoms I did speak with Dr. Jens Huang prior to seeing this patient who agrees that the patient can proceed with cardiac  catheterization if he continues to be symptomatic.  We have tried medical therapy and this has not been helpful to him.  With cardiovascular risk factors of hypertension, hyperlipidemia, type 2 diabetes, and known history of stent to the circumflex and right coronary artery cardiac catheterization is planned.  Continue metoprolol as directed.  He will have this completed on May 28, 2023, with Dr. [place] at 7:30 AM.  Labs are ordered.  He is advised to hold metformin the day before day of and day after procedure. The patient understands that risks include but are not limited to stroke (1 in 1000), death (1 in 1000), kidney failure [usually temporary] (1 in 500), bleeding (1 in 200), allergic reaction [possibly serious] (1 in 200), and agrees to proceed.    2.  Hyperlipidemia: The patient remains on Zetia 10 mg daily, and rosuvastatin 40 mg daily goal of LDL less than 70.  Most recent labs drawn on 03/11/2023, total cholesterol 154, HDL 43.2, LDL 77, triglycerides 297.  3.  Hypertension: Continue clonidine, amlodipine, hydralazine, isosorbide and lisinopril HCTZ 20/12.5 mg daily.  4.  Type 2 diabetes: Followed by PCP he is informed on holding metformin as directed.    COVID-19 Education: The signs and symptoms of COVID-19 were discussed with the patient and how to seek care for testing (follow up with PCP or arrange E-visit).  The importance of social distancing was discussed today.  Time:   Today, I have spent 45 minutes with the patient with telehealth technology discussing the above problems.     Medication Adjustments/Labs and Tests Ordered: Current medicines are reviewed at length with the patient today.  Concerns regarding  medicines are outlined above.   Tests Ordered: Orders Placed This Encounter  Procedures   Basic metabolic panel   CBC    Medication Changes: Disposition:  Follow up post cardiac cath   SignedBettey Mare. Liborio Nixon, ANP, AACC  05/16/2023 4:38 PM    [facility]

## 2023-05-13 NOTE — H&P (View-Only) (Signed)
Virtual Visit via Video Note   Because of [name]'s co-morbid illnesses, he is at least at moderate risk for complications without adequate follow up.  This format is felt to be most appropriate for this patient at this time.  All issues noted in this document were discussed and addressed.  A limited physical exam was performed with this format.  Please refer to the patient's chart for his consent to telehealth for [facility].      Date:  05/16/2023   ID:  Robert Huang, DOB [birth year], MRN [id number]  Patient Location: Home Provider Location: Office/Clinic  PCP:  [name], MD  Cardiologist:  [name], MD  Electrophysiologist:  None   Evaluation Performed:  Follow-Up Visit  Chief Complaint: Chest pressure and dyspnea on exertion  History of Present Illness:    Robert Huang is a 62 y.o. male with history of CAD, DES to left circumflex, RCA in March 2010, hypertension, hyperlipidemia, type 2 diabetes, hypothyroidism, and anxiety. Patient was admitted on 10/22/2021 with sepsis secondary to deep multiple abscesses and underwent debridement of large necrotic abscess of the left transmetatarsal amputation followed by left lower extremity trans tibial amputation on 3/3/202 last in the office by me on 02/15/2023 at which time the patient was complaining of increasing fatigue.     A cardiac PET scan was ordered for diagnostic prognostic purposes in the setting of priors drug-eluting stent and ongoing CVRF.  At the time of this office visit, PET scan results are not available.  Echocardiogram was also ordered and completed on 03/22/2023, revealing normal LVEF of 60 to 65% with grade 2 diastolic dysfunction.  He was also found to have mild dilatation of the ascending aorta measuring 42 mm.   We have discussed moving forward with cardiac catheterization versus medical management.  He wishes to try medical management first believing that he would likely  end up moving forward with cardiac catheterization.  I will start him on isosorbide 15 mg daily he is to take a couple Tylenol with them in order to help to suppress headache which may be associated.  I explained this to him.  We will revisit this in 1 month.  If he is not feeling any better we will likely move onto cardiac catheterization.  He continues to have some chest pressure, and dyspnea on exertion despite use of nitrates.  His energy level has decreased.  I have spoken with Dr. [name] previous to this appointment and the patient is okay to move forward with cardiac catheterization if he remains symptomatic.   The patient does not have symptoms concerning for COVID-19 infection (fever, chills, cough, or new shortness of breath).    Past Medical History:  Diagnosis Date   Anxiety    CAD (coronary artery disease)    sees Dr. [name], normal Stress test 07-16-12    Diabetes mellitus    DJD (degenerative joint disease)    Gout    Hyperlipidemia    Hypertension    Hypothyroid    Osteoarthritis    Renal calculus    hx   Umbilical hernia    Past Surgical History:  Procedure Laterality Date   AMPUTATION  07/17/2012   Procedure: AMPUTATION RAY;  Surgeon: [name], MD;  Location: [facility] OR;  Service: Orthopedics;  Laterality: Right;  right hallux amputation (1st ray resection )   AMPUTATION Left 03/06/2019   Procedure: Ray Amputation left foot;  Surgeon: [name], MD;  Location:  [facility] ORS;  Service: Orthopedics;  Laterality: Left;   AMPUTATION Left 10/27/2021   Procedure: LEFT BELOW KNEE AMPUTATION;  Surgeon: [name], MD;  Location: [facility] OR;  Service: Orthopedics;  Laterality: Left;   CORONARY ANGIOPLASTY WITH STENT PLACEMENT     FINGER SURGERY     left index finger   I & D EXTREMITY  05/19/2012   Procedure: IRRIGATION AND DEBRIDEMENT EXTREMITY;  Surgeon: [name], MD;  Location: [facility] OR;  Service: Orthopedics;  Laterality: Right;  Right Great toe   I & D EXTREMITY  Left 10/25/2021   Procedure: LEFT FOOT DEDRIDEMENT;  Surgeon: [name], MD;  Location: [facility] OR;  Service: Orthopedics;  Laterality: Left;   I & D Toe  05/19/2012   rt great toe   TONSILLECTOMY     TRANSMETATARSAL AMPUTATION Left 12/10/2020   Procedure: TRANSMETATARSAL AMPUTATION LEFT;  Surgeon: [name], MD;  Location: [facility] OR;  Service: Orthopedics;  Laterality: Left;     Current Meds  Medication Sig   acetaminophen (TYLENOL) 325 MG tablet Take 2 tablets (650 mg total) by mouth every 6 (six) hours as needed for mild pain (or Fever >/= 101).   ALPRAZolam (XANAX) 1 MG tablet TAKE 1 TABLET BY MOUTH THREE TIMES A DAY AS NEEDED FOR ANXIETY   amLODipine (NORVASC) 10 MG tablet TAKE 1 TABLET BY MOUTH EVERY DAY   Aspirin-Acetaminophen-Caffeine 500-325-65 MG PACK Take 1 Package by mouth daily as needed (pain).   cloNIDine (CATAPRES) 0.3 MG tablet TAKE 1 TABLET BY MOUTH TWICE A DAY   colchicine 0.6 MG tablet Take 1 tablet (0.6 mg total) by mouth every 6 (six) hours as needed (gout).   cyanocobalamin (VITAMIN B12) 1000 MCG/ML injection INJECT 1 ML (1,000 MCG TOTAL) INTO THE MUSCLE ONCE A WEEK.   ezetimibe (ZETIA) 10 MG tablet TAKE 1 TABLET BY MOUTH EVERY DAY   fenofibrate 160 MG tablet TAKE 1 TABLET BY MOUTH EVERY DAY   fluticasone (FLONASE) 50 MCG/ACT nasal spray Place 2 sprays into both nostrils daily.   glipiZIDE (GLUCOTROL) 5 MG tablet TAKE 1 TABLET BY MOUTH TWICE A DAY   hydrALAZINE (APRESOLINE) 10 MG tablet TAKE 1 TABLET (10 MG TOTAL) BY MOUTH IN THE MORNING AND AT BEDTIME. PLEASE KEEP SCHEDULED APPOINTMENT FOR FUTURE REFILLS. THANK YOU.   HYDROcodone-acetaminophen (NORCO) 10-325 MG tablet Take 1 tablet by mouth every 6 (six) hours as needed for moderate pain.   indomethacin (INDOCIN SR) 75 MG CR capsule TAKE 1 CAPSULE BY MOUTH TWICE A DAY WITH A MEAL (Patient taking differently: as needed.)   isosorbide mononitrate (IMDUR) 30 MG 24 hr tablet Take 0.5 tablets (15 mg total) by mouth daily.    levothyroxine (SYNTHROID) 112 MCG tablet TAKE 1 TABLET BY MOUTH EVERY DAY   lisinopril-hydrochlorothiazide (ZESTORETIC) 20-12.5 MG tablet TAKE 1 TABLET BY MOUTH 2 (TWO) TIMES DAILY. 10AM AND 5PM   metFORMIN (GLUCOPHAGE) 1000 MG tablet TAKE 1 TABLET BY MOUTH TWO TIMES A DAY WITH A MEAL   metoprolol tartrate (LOPRESSOR) 50 MG tablet TAKE 1 TABLET BY MOUTH TWICE A DAY   mupirocin ointment (BACTROBAN) 2 % Apply 1 Application topically 2 (two) times daily.   nitroGLYCERIN (NITROSTAT) 0.4 MG SL tablet Place 1 tablet (0.4 mg total) under the tongue every 5 (five) minutes as needed. For chest pain.   omeprazole (PRILOSEC) 40 MG capsule TAKE 1 CAPSULE (40 MG TOTAL) BY MOUTH DAILY.   ONETOUCH ULTRA test strip 1 EACH BY OTHER ROUTE DAILY.  USE AS INSTRUCTED   rosuvastatin (CRESTOR) 40 MG tablet TAKE 1 TABLET BY MOUTH EVERY DAY   Syringe/Needle, Disp, (SYRINGE 3CC/25GX1") 25G X 1" 3 ML MISC 1 application by Does not apply route once a week.   zolpidem (AMBIEN) 10 MG tablet TAKE 1 TABLET (10 MG TOTAL) BY MOUTH AT BEDTIME AS NEEDED. FOR SLEEP   [DISCONTINUED] HYDROcodone-acetaminophen (NORCO) 10-325 MG tablet Take 1 tablet by mouth every 6 (six) hours as needed for moderate pain.   [DISCONTINUED] HYDROcodone-acetaminophen (NORCO) 10-325 MG tablet Take 1 tablet by mouth every 6 (six) hours as needed for moderate pain.     Allergies:   Patient has no known allergies.   Social History   Tobacco Use   Smoking status: Never   Smokeless tobacco: Current    Types: Chew   Tobacco comments:    Occ    Patient chew occasionally   Vaping Use   Vaping status: Never Used  Substance Use Topics   Alcohol use: Yes    Alcohol/week: 0.0 standard drinks of alcohol    Comment: weekends   Drug use: No     Family Hx: The patient's family history includes Arthritis in an other family member; Coronary artery disease in an other family member; Diabetes in an other family member; Heart attack in his mother; Hypertension  in an other family member; Prostate cancer in an other family member; Stroke in an other family member.  ROS:   Please see the history of present illness.     All other systems reviewed and are negative.   Prior CV studies:   The following studies were reviewed today:  NM PET 04/18/2023 Findings are consistent with ischemia: small mid inferior ischemia with RCA stress flow of 1.8 ml/g/min (mildly decreased). The study is intermediate risk.   LV perfusion is abnormal. There is evidence of ischemia. Defect 1: There is a small defect with moderate reduction in uptake present in the mid inferior location(s) that is reversible. There is normal wall motion in the defect area. Consistent with ischemia.   End diastolic cavity size is mildly enlarged. End systolic cavity size is mildly enlarged.   Myocardial blood flow was computed to be 1.64ml/g/min at rest and 2.6ml/g/min at stress. Global myocardial blood flow reserve was 1.70 and was mildly abnormal.   Coronary calcium assessment not performed due to prior revascularization. Aortic atherosclerosis.   Electronically Signed  By: Riley Lam M.D.  Echocardiogram 03/22/2023      1. Left ventricular ejection fraction, by estimation, is 60 to 65%. The  left ventricle has normal function. The left ventricle has no regional  wall motion abnormalities. There is moderate concentric left ventricular  hypertrophy. Left ventricular  diastolic parameters are consistent with Grade II diastolic dysfunction  (pseudonormalization).   2. Right ventricular systolic function is normal. The right ventricular  size is normal. There is normal pulmonary artery systolic pressure.   3. Left atrial size was moderately dilated.   4. The mitral valve is normal in structure. Trivial mitral valve  regurgitation. No evidence of mitral stenosis.   5. The aortic valve is tricuspid. Aortic valve regurgitation is not  visualized. No aortic stenosis is present.   6.  Aortic dilatation noted. There is mild dilatation of the ascending  aorta, measuring 42 mm.   7. The inferior vena cava is dilated in size with >50% respiratory  variability, suggesting right atrial pressure of 8 mmHg.   Labs/Other Tests and Data Reviewed:  EKG:  No ECG reviewed.  EKG will be completed prior to cardiac catheterization.  Most recent EKG reviewed sinus bradycardia with first-degree AV block, LVH is noted heart rate of 55 bpm.  Recent Labs: 03/11/2023: ALT 20; BUN 9; Creatinine, Ser 1.05; Hemoglobin 12.7; Platelets 221.0; Potassium 3.6; Sodium 138; TSH 0.18   Recent Lipid Panel Lab Results  Component Value Date/Time   CHOL 154 03/11/2023 03:54 PM   CHOL 166 01/23/2022 12:25 PM   TRIG 297.0 (H) 03/11/2023 03:54 PM   HDL 43.20 03/11/2023 03:54 PM   HDL 47 01/23/2022 12:25 PM   CHOLHDL 4 03/11/2023 03:54 PM   LDLCALC 90 01/23/2022 12:25 PM   LDLDIRECT 77.0 03/11/2023 03:54 PM    Wt Readings from Last 3 Encounters:  05/16/23 200 lb (90.7 kg)  04/26/23 200 lb (90.7 kg)  03/29/23 203 lb 9.6 oz (92.4 kg)     Objective:    Vital Signs:  BP 118/67 (BP Location: Left Arm, Patient Position: Sitting, Cuff Size: Normal)   Pulse (!) 52   Ht 6' (1.829 m)   Wt 200 lb (90.7 kg)   SpO2 98%   BMI 27.12 kg/m    VITAL SIGNS:  reviewed GEN:  no acute distress RESPIRATORY:  normal respiratory effort, symmetric expansion CARDIOVASCULAR:  no peripheral edema PSYCH:  normal affect  ASSESSMENT & PLAN:    Coronary artery disease, History of drug-eluting stent to the left circumflex and RCA in March 2010.  He has had an abnormal PET scan as described above with reversible ischemia in the RCA distribution.  He is symptomatic with dyspnea on exertion, chest pressure, and profound fatigue.  EKG revealed sinus bradycardia with first-degree AV block. Due to symptoms I did speak with Dr. [name] prior to seeing this patient who agrees that the patient can proceed with cardiac  catheterization if he continues to be symptomatic.  We have tried medical therapy and this has not been helpful to him.  With cardiovascular risk factors of hypertension, hyperlipidemia, type 2 diabetes, and known history of stent to the circumflex and right coronary artery cardiac catheterization is planned.  Continue metoprolol as directed.  He will have this completed on May 28, 2023, with Dr. Swaziland at 7:30 AM.  Labs are ordered.  He is advised to hold metformin the day before day of and day after procedure. The patient understands that risks include but are not limited to stroke (1 in 1000), death (1 in 1000), kidney failure [usually temporary] (1 in 500), bleeding (1 in 200), allergic reaction [possibly serious] (1 in 200), and agrees to proceed.    2.  Hyperlipidemia: The patient remains on Zetia 10 mg daily, and rosuvastatin 40 mg daily goal of LDL less than 70.  Most recent labs drawn on 03/11/2023, total cholesterol 154, HDL 43.2, LDL 77, triglycerides 297.  3.  Hypertension: Continue clonidine, amlodipine, hydralazine, isosorbide and lisinopril HCTZ 20/12.5 mg daily.  4.  Type 2 diabetes: Followed by PCP he is informed on holding metformin as directed.    COVID-19 Education: The signs and symptoms of COVID-19 were discussed with the patient and how to seek care for testing (follow up with PCP or arrange E-visit).  The importance of social distancing was discussed today.  Time:   Today, I have spent 45 minutes with the patient with telehealth technology discussing the above problems.     Medication Adjustments/Labs and Tests Ordered: Current medicines are reviewed at length with the patient today.  Concerns regarding  medicines are outlined above.   Tests Ordered: Orders Placed This Encounter  Procedures   Basic metabolic panel   CBC    Medication Changes: Disposition:  Follow up post cardiac cath   SignedBettey Mare. Liborio Nixon, ANP, AACC  05/16/2023 4:38 PM    [facility]

## 2023-05-16 ENCOUNTER — Ambulatory Visit: Payer: No Typology Code available for payment source | Attending: Cardiovascular Disease | Admitting: Adult Health

## 2023-05-16 ENCOUNTER — Encounter: Payer: Self-pay | Admitting: Adult Health

## 2023-05-16 ENCOUNTER — Telehealth: Payer: Self-pay

## 2023-05-16 VITALS — BP 118/67 | HR 52 | Ht 72.0 in | Wt 200.0 lb

## 2023-05-16 DIAGNOSIS — I10 Essential (primary) hypertension: Secondary | ICD-10-CM

## 2023-05-16 DIAGNOSIS — R0609 Other forms of dyspnea: Secondary | ICD-10-CM

## 2023-05-16 DIAGNOSIS — I2510 Atherosclerotic heart disease of native coronary artery without angina pectoris: Secondary | ICD-10-CM | POA: Diagnosis not present

## 2023-05-16 DIAGNOSIS — R079 Chest pain, unspecified: Secondary | ICD-10-CM

## 2023-05-16 DIAGNOSIS — E7800 Pure hypercholesterolemia, unspecified: Secondary | ICD-10-CM | POA: Diagnosis not present

## 2023-05-16 NOTE — Telephone Encounter (Signed)
  Patient Consent for Virtual Visit        Robert Huang [id number] has provided verbal consent on 05/16/2023 for a virtual visit (video or telephone).   CONSENT FOR VIRTUAL VISIT FOR:  Robert Huang  By participating in this virtual visit I agree to the following:  I hereby voluntarily request, consent and authorize [facility] and its employed or contracted physicians, physician assistants, nurse practitioners or other licensed health care professionals (the Practitioner), to provide me with telemedicine health care services (the "Services") as deemed necessary by the treating Practitioner. I acknowledge and consent to receive the Services by the Practitioner via telemedicine. I understand that the telemedicine visit will involve communicating with the Practitioner through live audiovisual communication technology and the disclosure of certain medical information by electronic transmission. I acknowledge that I have been given the opportunity to request an in-person assessment or other available alternative prior to the telemedicine visit and am voluntarily participating in the telemedicine visit.  I understand that I have the right to withhold or withdraw my consent to the use of telemedicine in the course of my care at any time, without affecting my right to future care or treatment, and that the Practitioner or I may terminate the telemedicine visit at any time. I understand that I have the right to inspect all information obtained and/or recorded in the course of the telemedicine visit and may receive copies of available information for a reasonable fee.  I understand that some of the potential risks of receiving the Services via telemedicine include:  Delay or interruption in medical evaluation due to technological equipment failure or disruption; Information transmitted may not be sufficient (e.g. poor resolution of images) to allow for appropriate medical decision making by the  Practitioner; and/or  In rare instances, security protocols could fail, causing a breach of personal health information.  Furthermore, I acknowledge that it is my responsibility to provide information about my medical history, conditions and care that is complete and accurate to the best of my ability. I acknowledge that Practitioner's advice, recommendations, and/or decision may be based on factors not within their control, such as incomplete or inaccurate data provided by me or distortions of diagnostic images or specimens that may result from electronic transmissions. I understand that the practice of medicine is not an exact science and that Practitioner makes no warranties or guarantees regarding treatment outcomes. I acknowledge that a copy of this consent can be made available to me via my patient portal [facility] MyChart), or I can request a printed copy by calling the office of [facility].    I understand that my insurance will be billed for this visit.   I have read or had this consent read to me. I understand the contents of this consent, which adequately explains the benefits and risks of the Services being provided via telemedicine.  I have been provided ample opportunity to ask questions regarding this consent and the Services and have had my questions answered to my satisfaction. I give my informed consent for the services to be provided through the use of telemedicine in my medical care

## 2023-05-16 NOTE — Patient Instructions (Signed)
Medication Instructions:  No Changes *If you need a refill on your cardiac medications before your next appointment, please call your pharmacy*   Lab Work: CBC, BMET If you have labs (blood work) drawn today and your tests are completely normal, you will receive your results only by: MyChart Message (if you have MyChart) OR A paper copy in the mail If you have any lab test that is abnormal or we need to change your treatment, we will call you to review the results.   Testing/Procedures:       Cardiac/Peripheral Catheterization   You are scheduled for a Cardiac Catheterization on Tuesday, October 1 with Dr. Peter Swaziland.  1. Please arrive at the [facility] (Main Entrance A) at [facility]: [address] [place], [place] [address] at 5:30 AM (This time is 2 hour(s) before your procedure to ensure your preparation). Free valet parking service is available. You will check in at ADMITTING. The support person will be asked to wait in the waiting room.  It is OK to have someone drop you off and come back when you are ready to be discharged.        Special note: Every effort is made to have your procedure done on time. Please understand that emergencies sometimes delay scheduled procedures.  2. Diet: Do not eat solid foods after midnight.  You may have clear liquids until 5 AM the day of the procedure.  3. Labs: You will need to have blood drawn on Tuesday, September 23 at [facility]: [address], [address], [place]. You do not need to be fasting.  4. Medication instructions in preparation for your procedure:   Contrast Allergy: No   Current Outpatient Medications (Endocrine & Metabolic):    glipiZIDE (GLUCOTROL) 5 MG tablet, TAKE 1 TABLET BY MOUTH TWICE A DAY   levothyroxine (SYNTHROID) 112 MCG tablet, TAKE 1 TABLET BY MOUTH EVERY DAY   metFORMIN (GLUCOPHAGE) 1000 MG tablet, TAKE 1 TABLET BY MOUTH TWO TIMES A DAY WITH A MEAL  Current Outpatient Medications  (Cardiovascular):    amLODipine (NORVASC) 10 MG tablet, TAKE 1 TABLET BY MOUTH EVERY DAY   cloNIDine (CATAPRES) 0.3 MG tablet, TAKE 1 TABLET BY MOUTH TWICE A DAY   ezetimibe (ZETIA) 10 MG tablet, TAKE 1 TABLET BY MOUTH EVERY DAY   fenofibrate 160 MG tablet, TAKE 1 TABLET BY MOUTH EVERY DAY   hydrALAZINE (APRESOLINE) 10 MG tablet, TAKE 1 TABLET (10 MG TOTAL) BY MOUTH IN THE MORNING AND AT BEDTIME. PLEASE KEEP SCHEDULED APPOINTMENT FOR FUTURE REFILLS. THANK YOU.   isosorbide mononitrate (IMDUR) 30 MG 24 hr tablet, Take 0.5 tablets (15 mg total) by mouth daily.   lisinopril-hydrochlorothiazide (ZESTORETIC) 20-12.5 MG tablet, TAKE 1 TABLET BY MOUTH 2 (TWO) TIMES DAILY. 10AM AND 5PM   metoprolol tartrate (LOPRESSOR) 50 MG tablet, TAKE 1 TABLET BY MOUTH TWICE A DAY   nitroGLYCERIN (NITROSTAT) 0.4 MG SL tablet, Place 1 tablet (0.4 mg total) under the tongue every 5 (five) minutes as needed. For chest pain.   rosuvastatin (CRESTOR) 40 MG tablet, TAKE 1 TABLET BY MOUTH EVERY DAY  Current Outpatient Medications (Respiratory):    fluticasone (FLONASE) 50 MCG/ACT nasal spray, Place 2 sprays into both nostrils daily.  Current Outpatient Medications (Analgesics):    acetaminophen (TYLENOL) 325 MG tablet, Take 2 tablets (650 mg total) by mouth every 6 (six) hours as needed for mild pain (or Fever >/= 101).   Aspirin-Acetaminophen-Caffeine 500-325-65 MG PACK, Take 1  Package by mouth daily as needed (pain).   colchicine 0.6 MG tablet, Take 1 tablet (0.6 mg total) by mouth every 6 (six) hours as needed (gout).   HYDROcodone-acetaminophen (NORCO) 10-325 MG tablet, Take 1 tablet by mouth every 6 (six) hours as needed for moderate pain.   indomethacin (INDOCIN SR) 75 MG CR capsule, TAKE 1 CAPSULE BY MOUTH TWICE A DAY WITH A MEAL (Patient taking differently: as needed.)  Current Outpatient Medications (Hematological):    cyanocobalamin (VITAMIN B12) 1000 MCG/ML injection, INJECT 1 ML (1,000 MCG TOTAL) INTO THE  MUSCLE ONCE A WEEK.   ferrous sulfate 325 (65 FE) MG EC tablet, Take 1 tablet (325 mg total) by mouth 2 (two) times daily.  Current Outpatient Medications (Other):    ALPRAZolam (XANAX) 1 MG tablet, TAKE 1 TABLET BY MOUTH THREE TIMES A DAY AS NEEDED FOR ANXIETY   mupirocin ointment (BACTROBAN) 2 %, Apply 1 Application topically 2 (two) times daily.   omeprazole (PRILOSEC) 40 MG capsule, TAKE 1 CAPSULE (40 MG TOTAL) BY MOUTH DAILY.   ONETOUCH ULTRA test strip, 1 EACH BY OTHER ROUTE DAILY. USE AS INSTRUCTED   Syringe/Needle, Disp, (SYRINGE 3CC/25GX1") 25G X 1" 3 ML MISC, 1 application by Does not apply route once a week.   zolpidem (AMBIEN) 10 MG tablet, TAKE 1 TABLET (10 MG TOTAL) BY MOUTH AT BEDTIME AS NEEDED. FOR SLEEP       Hold Glucotrol Day Before    Do not take Diabetes Med Glucophage (Metformin) on the day of the procedure and HOLD 48 HOURS AFTER THE PROCEDURE.  On the morning of your procedure, take Aspirin 81 mg and any morning medicines NOT listed above.  You may use sips of water.  5. Plan to go home the same day, you will only stay overnight if medically necessary. 6. You MUST have a responsible adult to drive you home. 7. An adult MUST be with you the first 24 hours after you arrive home. 8. Bring a current list of your medications, and the last time and date medication taken. 9. Bring ID and current insurance cards. 10.Please wear clothes that are easy to get on and off and wear slip-on shoes.  Thank you for allowing [place] to care for you!   -- [facility]    Follow-Up: At [facility], you and your health needs are our priority.  As part of our continuing mission to provide you with exceptional heart care, we have created designated Provider Care Teams.  These Care Teams include your primary Cardiologist (physician) and Advanced Practice Providers (APPs -  Physician Assistants and Nurse Practitioners) who all work together to  provide you with the care you need, when you need it.  We recommend signing up for the patient portal called "MyChart".  Sign up information is provided on this After Visit Summary.  MyChart is used to connect with patients for Virtual Visits (Telemedicine).  Patients are able to view lab/test results, encounter notes, upcoming appointments, etc.  Non-urgent messages can be sent to your provider as well.   To learn more about what you can do with MyChart, go to [email or web address].    Your next appointment:   2-3week(s)  Provider:   Joni Reining, DNP, ANP

## 2023-05-20 ENCOUNTER — Telehealth: Payer: Self-pay

## 2023-05-20 NOTE — Telephone Encounter (Addendum)
Results discussed with patient during virtual visit on 05/17/23 Patient had understanding of results. Patient scheduled for Left Heart Cath.----- Message from Joni Reining sent at 04/21/2023  9:23 AM EDT ----- I am checking with Dr. [name] about whether to move toward cath or treat medically.  Will await his response before contacting the patient.  KL

## 2023-05-22 ENCOUNTER — Encounter: Payer: Self-pay | Admitting: Family Medicine

## 2023-05-22 LAB — CBC
Hematocrit: 37.1 % — ABNORMAL LOW (ref 37.5–51.0)
Hemoglobin: 12.1 g/dL — ABNORMAL LOW (ref 13.0–17.7)
MCH: 31.8 pg (ref 26.6–33.0)
MCHC: 32.6 g/dL (ref 31.5–35.7)
MCV: 98 fL — ABNORMAL HIGH (ref 79–97)
Platelets: 241 10*3/uL (ref 150–450)
RBC: 3.8 x10E6/uL — ABNORMAL LOW (ref 4.14–5.80)
RDW: 13.2 % (ref 11.6–15.4)
WBC: 4.1 10*3/uL (ref 3.4–10.8)

## 2023-05-22 LAB — BASIC METABOLIC PANEL
BUN/Creatinine Ratio: 7 — ABNORMAL LOW (ref 10–24)
BUN: 7 mg/dL — ABNORMAL LOW (ref 8–27)
CO2: 21 mmol/L (ref 20–29)
Calcium: 9.1 mg/dL (ref 8.6–10.2)
Chloride: 101 mmol/L (ref 96–106)
Creatinine, Ser: 1.07 mg/dL (ref 0.76–1.27)
Glucose: 192 mg/dL — ABNORMAL HIGH (ref 70–99)
Potassium: 3.8 mmol/L (ref 3.5–5.2)
Sodium: 138 mmol/L (ref 134–144)
eGFR: 78 mL/min/{1.73_m2} (ref >59)

## 2023-05-23 ENCOUNTER — Telehealth: Payer: Self-pay

## 2023-05-23 MED ORDER — ALPRAZOLAM 1 MG PO TABS: 1.0000 mg | ORAL_TABLET | Freq: Three times a day (TID) | ORAL | 1 refills | Status: DC | PRN

## 2023-05-23 NOTE — Telephone Encounter (Addendum)
Called patient results. Patient had understanding of results.----- Message from Joni Reining sent at 05/23/2023  7:26 AM EDT ----- Labs essentially normal  No concerns. Continue current regimen.

## 2023-05-23 NOTE — Telephone Encounter (Signed)
Done

## 2023-05-28 ENCOUNTER — Other Ambulatory Visit: Payer: Self-pay

## 2023-05-28 ENCOUNTER — Other Ambulatory Visit (HOSPITAL_COMMUNITY): Payer: Self-pay

## 2023-05-28 ENCOUNTER — Ambulatory Visit (HOSPITAL_COMMUNITY)
Admission: RE | Admit: 2023-05-28 | Discharge: 2023-05-28 | Disposition: A | Discharge status: Home / Self Care | Payer: No Typology Code available for payment source | Attending: Cardiology | Admitting: Cardiology

## 2023-05-28 ENCOUNTER — Encounter (HOSPITAL_COMMUNITY)
Admission: RE | Disposition: A | Discharge status: Home / Self Care | Payer: Self-pay | Source: Home / Self Care | Attending: Cardiology

## 2023-05-28 DIAGNOSIS — Z7984 Long term (current) use of oral hypoglycemic drugs: Secondary | ICD-10-CM | POA: Insufficient documentation

## 2023-05-28 DIAGNOSIS — E119 Type 2 diabetes mellitus without complications: Secondary | ICD-10-CM | POA: Insufficient documentation

## 2023-05-28 DIAGNOSIS — E785 Hyperlipidemia, unspecified: Secondary | ICD-10-CM | POA: Insufficient documentation

## 2023-05-28 DIAGNOSIS — R079 Chest pain, unspecified: Secondary | ICD-10-CM | POA: Diagnosis present

## 2023-05-28 DIAGNOSIS — E039 Hypothyroidism, unspecified: Secondary | ICD-10-CM | POA: Insufficient documentation

## 2023-05-28 DIAGNOSIS — Z89512 Acquired absence of left leg below knee: Secondary | ICD-10-CM | POA: Diagnosis not present

## 2023-05-28 DIAGNOSIS — Z833 Family history of diabetes mellitus: Secondary | ICD-10-CM | POA: Diagnosis not present

## 2023-05-28 DIAGNOSIS — I2510 Atherosclerotic heart disease of native coronary artery without angina pectoris: Secondary | ICD-10-CM

## 2023-05-28 DIAGNOSIS — I10 Essential (primary) hypertension: Secondary | ICD-10-CM | POA: Diagnosis not present

## 2023-05-28 DIAGNOSIS — Z955 Presence of coronary angioplasty implant and graft: Secondary | ICD-10-CM

## 2023-05-28 DIAGNOSIS — I25118 Atherosclerotic heart disease of native coronary artery with other forms of angina pectoris: Secondary | ICD-10-CM | POA: Insufficient documentation

## 2023-05-28 DIAGNOSIS — F17220 Nicotine dependence, chewing tobacco, uncomplicated: Secondary | ICD-10-CM | POA: Diagnosis not present

## 2023-05-28 DIAGNOSIS — Z8249 Family history of ischemic heart disease and other diseases of the circulatory system: Secondary | ICD-10-CM | POA: Diagnosis not present

## 2023-05-28 DIAGNOSIS — Z89411 Acquired absence of right great toe: Secondary | ICD-10-CM | POA: Diagnosis not present

## 2023-05-28 HISTORY — PX: LEFT HEART CATH AND CORONARY ANGIOGRAPHY: CATH118249

## 2023-05-28 HISTORY — PX: CORONARY LITHOTRIPSY: CATH118330

## 2023-05-28 HISTORY — PX: CORONARY STENT INTERVENTION: CATH118234

## 2023-05-28 LAB — GLUCOSE, CAPILLARY
Glucose-Capillary: 153 mg/dL — ABNORMAL HIGH (ref 70–99)
Glucose-Capillary: 143 mg/dL — ABNORMAL HIGH (ref 70–99)

## 2023-05-28 LAB — POCT ACTIVATED CLOTTING TIME
Activated Clotting Time: 299 s
Activated Clotting Time: 171 s
Activated Clotting Time: 305 s

## 2023-05-28 SURGERY — LEFT HEART CATH AND CORONARY ANGIOGRAPHY
Anesthesia: LOCAL

## 2023-05-28 MED ORDER — NITROGLYCERIN 1 MG/10 ML FOR IR/CATH LAB
INTRA_ARTERIAL | Status: AC
  Filled 2023-05-28: qty 10

## 2023-05-28 MED ORDER — VERAPAMIL HCL 2.5 MG/ML IV SOLN
INTRAVENOUS | Status: AC
  Filled 2023-05-28: qty 2

## 2023-05-28 MED ORDER — MIDAZOLAM HCL 2 MG/2ML IJ SOLN
INTRAMUSCULAR | Status: DC | PRN
  Administered 2023-05-28 (×3): 1 mg via INTRAVENOUS

## 2023-05-28 MED ORDER — FAMOTIDINE IN NACL 20-0.9 MG/50ML-% IV SOLN
INTRAVENOUS | Status: DC | PRN
  Administered 2023-05-28: 20 mg via INTRAVENOUS

## 2023-05-28 MED ORDER — HEPARIN SODIUM (PORCINE) 1000 UNIT/ML IJ SOLN
INTRAMUSCULAR | Status: DC | PRN
  Administered 2023-05-28: 4000 [IU] via INTRAVENOUS
  Administered 2023-05-28 (×2): 4500 [IU] via INTRAVENOUS

## 2023-05-28 MED ORDER — ENALAPRILAT 1.25 MG/ML IV SOLN
INTRAVENOUS | Status: AC
  Filled 2023-05-28: qty 1

## 2023-05-28 MED ORDER — ASPIRIN 81 MG PO CHEW: 81.0000 mg | CHEWABLE_TABLET | Freq: Every day | ORAL | 2 refills | Status: DC

## 2023-05-28 MED ORDER — PANTOPRAZOLE SODIUM 40 MG PO TBEC
40.0000 mg | DELAYED_RELEASE_TABLET | Freq: Every day | ORAL | 1 refills | Status: DC
  Filled 2023-05-28: qty 30, 30d supply, fill #0

## 2023-05-28 MED ORDER — HEPARIN SODIUM (PORCINE) 1000 UNIT/ML IJ SOLN
INTRAMUSCULAR | Status: AC
  Filled 2023-05-28: qty 10

## 2023-05-28 MED ORDER — CLOPIDOGREL BISULFATE 75 MG PO TABS
75.0000 mg | ORAL_TABLET | Freq: Every day | ORAL | 2 refills | Status: DC
  Filled 2023-05-28: qty 30, 30d supply, fill #0

## 2023-05-28 MED ORDER — FAMOTIDINE IN NACL 20-0.9 MG/50ML-% IV SOLN
INTRAVENOUS | Status: AC
  Filled 2023-05-28: qty 50

## 2023-05-28 MED ORDER — HYDRALAZINE HCL 20 MG/ML IJ SOLN: 10.0000 mg | INTRAMUSCULAR | Status: DC | PRN

## 2023-05-28 MED ORDER — HEPARIN (PORCINE) IN NACL 1000-0.9 UT/500ML-% IV SOLN
INTRAVENOUS | Status: DC | PRN
  Administered 2023-05-28 (×2): 500 mL

## 2023-05-28 MED ORDER — MIDAZOLAM HCL 2 MG/2ML IJ SOLN
INTRAMUSCULAR | Status: AC
  Filled 2023-05-28: qty 2

## 2023-05-28 MED ORDER — CLOPIDOGREL BISULFATE 300 MG PO TABS
ORAL_TABLET | ORAL | Status: AC
  Filled 2023-05-28: qty 1

## 2023-05-28 MED ORDER — LIDOCAINE HCL (PF) 1 % IJ SOLN
INTRAMUSCULAR | Status: AC
  Filled 2023-05-28: qty 30

## 2023-05-28 MED ORDER — FENTANYL CITRATE (PF) 100 MCG/2ML IJ SOLN
INTRAMUSCULAR | Status: AC
  Filled 2023-05-28: qty 2

## 2023-05-28 MED ORDER — SODIUM CHLORIDE 0.9 % IV SOLN: INTRAVENOUS | Status: AC

## 2023-05-28 MED ORDER — LIDOCAINE HCL (PF) 1 % IJ SOLN
INTRAMUSCULAR | Status: DC | PRN
  Administered 2023-05-28: 2 mL

## 2023-05-28 MED ORDER — NITROGLYCERIN 1 MG/10 ML FOR IR/CATH LAB
INTRA_ARTERIAL | Status: DC | PRN
  Administered 2023-05-28 (×4): 200 ug via INTRACORONARY

## 2023-05-28 MED ORDER — ASPIRIN 81 MG PO CHEW: 81.0000 mg | CHEWABLE_TABLET | Freq: Every day | ORAL | Status: DC

## 2023-05-28 MED ORDER — ASPIRIN 81 MG PO CHEW
81.0000 mg | CHEWABLE_TABLET | ORAL | Status: DC
  Filled 2023-05-28: qty 1

## 2023-05-28 MED ORDER — CLOPIDOGREL BISULFATE 75 MG PO TABS: 75.0000 mg | ORAL_TABLET | Freq: Every day | ORAL | Status: DC

## 2023-05-28 MED ORDER — VERAPAMIL HCL 2.5 MG/ML IV SOLN
INTRAVENOUS | Status: DC | PRN
  Administered 2023-05-28: 10 mL via INTRA_ARTERIAL

## 2023-05-28 MED ORDER — SODIUM CHLORIDE 0.9 % WEIGHT BASED INFUSION
3.0000 mL/kg/h | INTRAVENOUS | Status: AC
  Administered 2023-05-28: 3 mL/kg/h via INTRAVENOUS

## 2023-05-28 MED ORDER — ONDANSETRON HCL 4 MG/2ML IJ SOLN: 4.0000 mg | Freq: Four times a day (QID) | INTRAMUSCULAR | Status: DC | PRN

## 2023-05-28 MED ORDER — SODIUM CHLORIDE 0.9% FLUSH: 3.0000 mL | Freq: Two times a day (BID) | INTRAVENOUS | Status: DC

## 2023-05-28 MED ORDER — SODIUM CHLORIDE 0.9 % IV SOLN: 250.0000 mL | INTRAVENOUS | Status: DC | PRN

## 2023-05-28 MED ORDER — SODIUM CHLORIDE 0.9% FLUSH: 3.0000 mL | INTRAVENOUS | Status: DC | PRN

## 2023-05-28 MED ORDER — SODIUM CHLORIDE 0.9 % WEIGHT BASED INFUSION: 1.0000 mL/kg/h | INTRAVENOUS | Status: DC

## 2023-05-28 MED ORDER — FENTANYL CITRATE (PF) 100 MCG/2ML IJ SOLN
INTRAMUSCULAR | Status: DC | PRN
  Administered 2023-05-28 (×3): 25 ug via INTRAVENOUS

## 2023-05-28 MED ORDER — CLOPIDOGREL BISULFATE 300 MG PO TABS
ORAL_TABLET | ORAL | Status: DC | PRN
  Administered 2023-05-28: 600 mg via ORAL

## 2023-05-28 MED ORDER — IOHEXOL 350 MG/ML SOLN
INTRAVENOUS | Status: DC | PRN
  Administered 2023-05-28: 120 mL

## 2023-05-28 MED ORDER — ACETAMINOPHEN 325 MG PO TABS: 650.0000 mg | ORAL_TABLET | ORAL | Status: DC | PRN

## 2023-05-28 SURGICAL SUPPLY — 27 items
BALLN EMERGE MR 2.5X15 (BALLOONS) ×1
BALLN EMERGE MR 3.0X15 (BALLOONS) ×1
BALLN ~~LOC~~ EMERGE MR 3.75X20 (BALLOONS) ×1
BALLOON EMERGE MR 2.5X15 (BALLOONS) IMPLANT
BALLOON EMERGE MR 3.0X15 (BALLOONS) IMPLANT
BALLOON ~~LOC~~ EMERGE MR 3.75X20 (BALLOONS) IMPLANT
CATH 5FR JL3.5 JR4 ANG PIG MP (CATHETERS) IMPLANT
CATH GUIDELINER COAST (CATHETERS) IMPLANT
CATH LAUNCHER 6FR AL1 (CATHETERS) IMPLANT
CATH SHOCKWAVE C2 3.5X12 (CATHETERS) IMPLANT
CATHETER LAUNCHER 6FR AL1 (CATHETERS) ×1
DEVICE RAD COMP TR BAND LRG (VASCULAR PRODUCTS) IMPLANT
ELECT DEFIB PAD ADLT CADENCE (PAD) IMPLANT
GLIDESHEATH SLEND SS 6F .021 (SHEATH) IMPLANT
GUIDEWIRE INQWIRE 1.5J.035X260 (WIRE) IMPLANT
INQWIRE 1.5J .035X260CM (WIRE) ×1
KIT ENCORE 26 ADVANTAGE (KITS) IMPLANT
KIT SYRINGE INJ CVI SPIKEX1 (MISCELLANEOUS) IMPLANT
PACK CARDIAC CATHETERIZATION (CUSTOM PROCEDURE TRAY) ×1 IMPLANT
PROTECTION STATION PRESSURIZED (MISCELLANEOUS) ×1
SET ATX-X65L (MISCELLANEOUS) IMPLANT
SHEATH PROBE COVER 6X72 (BAG) IMPLANT
STATION PROTECTION PRESSURIZED (MISCELLANEOUS) IMPLANT
STENT SYNERGY XD 3.50X48 (Permanent Stent) IMPLANT
SYNERGY XD 3.50X48 (Permanent Stent) ×1 IMPLANT
TUBING CIL FLEX 10 FLL-RA (TUBING) IMPLANT
WIRE ASAHI PROWATER 180CM (WIRE) IMPLANT

## 2023-05-28 NOTE — Progress Notes (Signed)
Pt was educated on stent card, stent location, Antiplatelet and ASA use, wt restrictions, no baths/daily wash-ups, s/s of infection, ex guidelines, s/s to stop exercising, NTG use and calling 911, heart healthy diet, risk factors and CRPII. Pt received materials on exercise, diet, and CRPII. Will refer to [facility].   Robert Huang 05/28/2023 11:08 AM

## 2023-05-28 NOTE — Discharge Summary (Signed)
Discharge Summary for Same Day PCI   Patient ID: Robert Huang MRN: [id number]; DOB: [birth year]  Admit date: 05/28/2023 Discharge date: 05/28/2023  Primary Care Provider: [name], MD  Primary Cardiologist: [name], MD  Primary Electrophysiologist:  None   Discharge Diagnoses    Active Problems:   CHEST PAIN  Diagnostic Studies/Procedures    Cardiac Catheterization 05/28/2023:    Ost LM to Dist LM lesion is 55% stenosed.   Mid LAD lesion is 40% stenosed.   Prox Cx to Mid Cx lesion is 10% stenosed.   3rd Mrg lesion is 45% stenosed.   Prox RCA lesion is 25% stenosed.   Prox RCA to Mid RCA lesion is 90% stenosed.   Previously placed Dist RCA stent of unknown type is  widely patent.   A drug-eluting stent was successfully placed using a SYNERGY XD 3.50X48.   Post intervention, there is a 0% residual stenosis.   Post intervention, there is a 0% residual stenosis.   Post intervention, there is a 0% residual stenosis.   LV end diastolic pressure is moderately elevated.   Recommend uninterrupted dual antiplatelet therapy with Aspirin 81mg  daily and Clopidogrel 75mg  daily for a minimum of 6 months (stable ischemic heart disease-Class I recommendation).   Patent stents in the mid LCx, proximal and distal RCA Severe stenosis in mid RCA between prior stents Moderate left main CAD Moderately elevated LVEDP 25 mm Hg Successful PCI of the mid RCA with long 3.5 x 48 mm Synergy stent with Shockwave lithotripsy. Stent overlaps prior stents proximally and distally.   Plan: DAPT for a minimum of 6 months. Anticipate same day DC   Diagnostic Dominance: Right  Intervention   _____________   History of Present Illness     Robert Huang is a 62 y.o. male with history of CAD, DES to left circumflex, RCA in March 2010, hypertension, hyperlipidemia, type 2 diabetes, hypothyroidism, and anxiety. Patient was admitted on 10/22/2021 with sepsis secondary to deep  multiple abscesses and underwent debridement of large necrotic abscess of the left transmetatarsal amputation followed by left lower extremity trans tibial amputation on 3/3/202 last in the office by Joni Reining, NP on 02/15/2023 at which time the patient was complaining of increasing fatigue.     A cardiac PET scan was ordered for diagnostic prognostic purposes in the setting of priors drug-eluting stent and ongoing CVRF.  At the time of this office visit, PET scan results are not available.  Echocardiogram was also ordered and completed on 03/22/2023, revealing normal LVEF of 60 to 65% with grade 2 diastolic dysfunction.  He was also found to have mild dilatation of the ascending aorta measuring 42 mm.   It was discussed moving forward with cardiac catheterization versus medical management.  He wished to try medical management first believing that he would likely end up moving forward with cardiac catheterization.  He was placed on isosorbide 15 mg daily he is to take a couple Tylenol with them in order to help to suppress headache which may be associated.    Seen back in the office and continued to have some chest pressure, and dyspnea on exertion despite use of nitrates.  His energy level has decreased.  It was recommended that he proceed with cardiac cath.    Hospital Course     The patient underwent cardiac cath as noted above with patent stents in the mLcx, pRCA and dRCA with severe stenosis in the mRCA b/t prior stents. Successful  PCI/DES x1 of mRCA with shockwave lithotripsy. Plan for DAPT with ASA/plavix for at least 6 months. The patient was seen by cardiac rehab while in short stay. There were no observed complications post cath. Radial cath site was re-evaluated prior to discharge and found to be stable without any complications. Instructions/precautions regarding cath site care were given prior to discharge.  Pavel Pepitone [id number] was seen by Dr. [place] and determined stable for discharge  home. Follow up with our office has been arranged. Medications are listed below. Pertinent changes include addition of plavix and switched from prilosec to protonix.  _____________  Cath/PCI Registry Performance & Quality Measures: Aspirin prescribed? - Yes ADP Receptor Inhibitor (Plavix/Clopidogrel, Brilinta/Ticagrelor or Effient/Prasugrel) prescribed (includes medically managed patients)? - Yes High Intensity Statin (Lipitor 40-80mg  or Crestor 20-40mg ) prescribed? - Yes For EF <40%, was ACEI/ARB prescribed? - Not Applicable (EF >/= 40%) For EF <40%, Aldosterone Antagonist (Spironolactone or Eplerenone) prescribed? - Not Applicable (EF >/= 40%) Cardiac Rehab Phase II ordered (Included Medically managed Patients)? - Yes  _____________   Discharge Vitals Blood pressure (!) 141/70, pulse (!) 56, temperature (!) 97 F (36.1 C), temperature source Temporal, resp. rate (!) 22, height 6' (1.829 m), weight 90.7 kg, SpO2 100%.  Filed Weights   05/28/23 0651  Weight: 90.7 kg    Last Labs & Radiologic Studies    CBC No results for input(s): "WBC", "NEUTROABS", "HGB", "HCT", "MCV", "PLT" in the last 72 hours. Basic Metabolic Panel No results for input(s): "NA", "K", "CL", "CO2", "GLUCOSE", "BUN", "CREATININE", "CALCIUM", "MG", "PHOS" in the last 72 hours. Liver Function Tests No results for input(s): "AST", "ALT", "ALKPHOS", "BILITOT", "PROT", "ALBUMIN" in the last 72 hours. No results for input(s): "LIPASE", "AMYLASE" in the last 72 hours. High Sensitivity Troponin:   No results for input(s): "TROPONINIHS" in the last 720 hours.  BNP Invalid input(s): "POCBNP" D-Dimer No results for input(s): "DDIMER" in the last 72 hours. Hemoglobin A1C No results for input(s): "HGBA1C" in the last 72 hours. Fasting Lipid Panel No results for input(s): "CHOL", "HDL", "LDLCALC", "TRIG", "CHOLHDL", "LDLDIRECT" in the last 72 hours. Thyroid Function Tests No results for input(s): "TSH", "T4TOTAL",  "T3FREE", "THYROIDAB" in the last 72 hours.  Invalid input(s): "FREET3" _____________  CARDIAC CATHETERIZATION  Result Date: 05/28/2023   Ost LM to Dist LM lesion is 55% stenosed.   Mid LAD lesion is 40% stenosed.   Prox Cx to Mid Cx lesion is 10% stenosed.   3rd Mrg lesion is 45% stenosed.   Prox RCA lesion is 25% stenosed.   Prox RCA to Mid RCA lesion is 90% stenosed.   Previously placed Dist RCA stent of unknown type is  widely patent.   A drug-eluting stent was successfully placed using a SYNERGY XD 3.50X48.   Post intervention, there is a 0% residual stenosis.   Post intervention, there is a 0% residual stenosis.   Post intervention, there is a 0% residual stenosis.   LV end diastolic pressure is moderately elevated.   Recommend uninterrupted dual antiplatelet therapy with Aspirin 81mg  daily and Clopidogrel 75mg  daily for a minimum of 6 months (stable ischemic heart disease-Class I recommendation). Patent stents in the mid LCx, proximal and distal RCA Severe stenosis in mid RCA between prior stents Moderate left main CAD Moderately elevated LVEDP 25 mm Hg Successful PCI of the mid RCA with long 3.5 x 48 mm Synergy stent with Shockwave lithotripsy. Stent overlaps prior stents proximally and distally. Plan: DAPT for a minimum of 6  months. Anticipate same day DC    Disposition   Pt is being discharged home today in good condition.  Follow-up Plans & Appointments     Follow-up Information     Jodelle Gross, NP Follow up on 06/06/2023.   Specialties: Cardiology, Radiology, Cardiology Why: at 8:50am for your follow up appt with cardiology Contact information: [address] [place] [address] [phone]                Discharge Instructions     Amb Referral to Cardiac Rehabilitation   Complete by: As directed    Diagnosis: Coronary Stents   After initial evaluation and assessments completed: Virtual Based Care may be provided alone or in conjunction with  Phase 2 Cardiac Rehab based on patient barriers.: Yes   Intensive Cardiac Rehabilitation (ICR) [facility] location only OR Traditional Cardiac Rehabilitation (TCR) *If criteria for ICR are not met will enroll in TCR [facility] only): Yes        Discharge Medications   Allergies as of 05/28/2023   No Known Allergies      Medication List     STOP taking these medications    Aspirin-Acetaminophen-Caffeine 520-260-32.5 MG Pack   omeprazole 40 MG capsule Commonly known as: PRILOSEC       TAKE these medications    ALPRAZolam 1 MG tablet Commonly known as: XANAX Take 1 tablet (1 mg total) by mouth 3 (three) times daily as needed for anxiety.   amLODipine 10 MG tablet Commonly known as: NORVASC TAKE 1 TABLET BY MOUTH EVERY DAY   aspirin 81 MG chewable tablet Chew 1 tablet (81 mg total) by mouth daily. Start taking on: May 29, 2023   cloNIDine 0.3 MG tablet Commonly known as: CATAPRES TAKE 1 TABLET BY MOUTH TWICE A DAY   clopidogrel 75 MG tablet Commonly known as: Plavix Take 1 tablet (75 mg total) by mouth daily.   colchicine 0.6 MG tablet Take 1 tablet (0.6 mg total) by mouth every 6 (six) hours as needed (gout).   cyanocobalamin 1000 MCG/ML injection Commonly known as: VITAMIN B12 INJECT 1 ML (1,000 MCG TOTAL) INTO THE MUSCLE ONCE A WEEK. What changed: when to take this   doxycycline 100 MG tablet Commonly known as: VIBRA-TABS Take 100 mg by mouth daily as needed (Infection).   ezetimibe 10 MG tablet Commonly known as: ZETIA TAKE 1 TABLET BY MOUTH EVERY DAY   fenofibrate 160 MG tablet TAKE 1 TABLET BY MOUTH EVERY DAY   ferrous sulfate 325 (65 FE) MG EC tablet Take 1 tablet (325 mg total) by mouth 2 (two) times daily. What changed: when to take this   glipiZIDE 5 MG tablet Commonly known as: GLUCOTROL TAKE 1 TABLET BY MOUTH TWICE A DAY What changed:  when to take this reasons to take this   hydrALAZINE 10 MG tablet Commonly known as: APRESOLINE TAKE 1  TABLET (10 MG TOTAL) BY MOUTH IN THE MORNING AND AT BEDTIME. PLEASE KEEP SCHEDULED APPOINTMENT FOR FUTURE REFILLS. THANK YOU.   HYDROcodone-acetaminophen 10-325 MG tablet Commonly known as: NORCO Take 1 tablet by mouth every 6 (six) hours as needed for moderate pain.   indomethacin 75 MG CR capsule Commonly known as: INDOCIN SR TAKE 1 CAPSULE BY MOUTH TWICE A DAY WITH A MEAL What changed: See the new instructions.   isosorbide mononitrate 30 MG 24 hr tablet Commonly known as: IMDUR Take 0.5 tablets (15 mg total) by mouth daily.   levothyroxine 112 MCG tablet  Commonly known as: SYNTHROID TAKE 1 TABLET BY MOUTH EVERY DAY   lisinopril-hydrochlorothiazide 20-12.5 MG tablet Commonly known as: ZESTORETIC TAKE 1 TABLET BY MOUTH 2 (TWO) TIMES DAILY. 10AM AND 5PM   metFORMIN 1000 MG tablet Commonly known as: GLUCOPHAGE TAKE 1 TABLET BY MOUTH TWO TIMES A DAY WITH A MEAL   metoprolol tartrate 50 MG tablet Commonly known as: LOPRESSOR TAKE 1 TABLET BY MOUTH TWICE A DAY   mupirocin ointment 2 % Commonly known as: BACTROBAN Apply 1 Application topically 2 (two) times daily. What changed:  when to take this reasons to take this   nitroGLYCERIN 0.4 MG SL tablet Commonly known as: NITROSTAT Place 1 tablet (0.4 mg total) under the tongue every 5 (five) minutes as needed. For chest pain.   OneTouch Ultra test strip Generic drug: glucose blood 1 EACH BY OTHER ROUTE DAILY. USE AS INSTRUCTED   OVER THE COUNTER MEDICATION Apply 1 patch topically as needed (Energy and Nutrition). Nutrition patches   pantoprazole 40 MG tablet Commonly known as: Protonix Take 1 tablet (40 mg total) by mouth daily.   rosuvastatin 40 MG tablet Commonly known as: CRESTOR TAKE 1 TABLET BY MOUTH EVERY DAY   SYRINGE 3CC/25GX1" 25G X 1" 3 ML Misc 1 application by Does not apply route once a week.   zolpidem 10 MG tablet Commonly known as: AMBIEN TAKE 1 TABLET (10 MG TOTAL) BY MOUTH AT BEDTIME AS NEEDED.  FOR SLEEP        Allergies No Known Allergies  Outstanding Labs/Studies   N/a   Duration of Discharge Encounter   Greater than 30 minutes including physician time.  Signed, Laverda Page, NP 05/28/2023, 3:29 PM

## 2023-05-28 NOTE — Interval H&P Note (Signed)
History and Physical Interval Note:  05/28/2023 7:21 AM  Robert Huang [id number]  has presented today for surgery, with the diagnosis of CAD.  The various methods of treatment have been discussed with the patient and family. After consideration of risks, benefits and other options for treatment, the patient has consented to  Procedure(s): LEFT HEART CATH AND CORONARY ANGIOGRAPHY (N/A) as a surgical intervention.  The patient's history has been reviewed, patient examined, no change in status, stable for surgery.  I have reviewed the patient's chart and labs.  Questions were answered to the patient's satisfaction.   Cath Lab Visit (complete for each Cath Lab visit)  Clinical Evaluation Leading to the Procedure:   ACS: No.  Non-ACS:    Anginal Classification: CCS III  Anti-ischemic medical therapy: Maximal Therapy (2 or more classes of medications)  Non-Invasive Test Results: Intermediate-risk stress test findings: cardiac mortality 1-3%/year  Prior CABG: No previous CABG        Robert Huang [facility] 05/28/2023 7:21 AM

## 2023-05-29 ENCOUNTER — Encounter (HOSPITAL_COMMUNITY): Payer: Self-pay | Admitting: Cardiology

## 2023-05-31 ENCOUNTER — Other Ambulatory Visit: Payer: Self-pay | Admitting: Family Medicine

## 2023-05-31 DIAGNOSIS — I10 Essential (primary) hypertension: Secondary | ICD-10-CM

## 2023-06-03 ENCOUNTER — Telehealth (HOSPITAL_COMMUNITY): Payer: Self-pay

## 2023-06-03 NOTE — Progress Notes (Deleted)
Cardiology Clinic Note   Patient Name: Robert Huang [id number] Date of Encounter: 06/03/2023  Primary Care Provider:  [name], MD Primary Cardiologist:  [name], MD  Patient Profile    62 y.o. male with history of CAD, DES to left circumflex, RCA in March 2010, hypertension, hyperlipidemia, type 2 diabetes, hypothyroidism, and anxiety. Patient was admitted on 10/22/2021 with sepsis secondary to deep multiple abscesses and underwent debridement of large necrotic abscess of the left transmetatarsal amputation followed by left lower extremity trans tibial amputation on 10/27/2020. He has abnormal cardiac PET scan leading to cardiac cath on 05/28/2023 with 90% lesion of the Prox to Mid RCA, with PCI with long 3.5 x 48 mm Synergy stent with Shockwave lithotripsy. Stent overlaps prior stents proximally and distally.   Past Medical History    Past Medical History:  Diagnosis Date   Anxiety    CAD (coronary artery disease)    sees Dr. [name], normal Stress test 07-16-12    Diabetes mellitus    DJD (degenerative joint disease)    Gout    Hyperlipidemia    Hypertension    Hypothyroid    Osteoarthritis    Renal calculus    hx   Umbilical hernia    Past Surgical History:  Procedure Laterality Date   AMPUTATION  07/17/2012   Procedure: AMPUTATION RAY;  Surgeon: [name], MD;  Location: [facility] OR;  Service: Orthopedics;  Laterality: Right;  right hallux amputation (1st ray resection )   AMPUTATION Left 03/06/2019   Procedure: Ray Amputation left foot;  Surgeon: [name], MD;  Location: [facility] ORS;  Service: Orthopedics;  Laterality: Left;   AMPUTATION Left 10/27/2021   Procedure: LEFT BELOW KNEE AMPUTATION;  Surgeon: [name], MD;  Location: [facility] OR;  Service: Orthopedics;  Laterality: Left;   CORONARY ANGIOPLASTY WITH STENT PLACEMENT     CORONARY LITHOTRIPSY N/A 05/28/2023   Procedure: CORONARY LITHOTRIPSY;  Surgeon: Swaziland, Peter M, MD;  Location: [facility] INVASIVE CV LAB;   Service: Cardiovascular;  Laterality: N/A;   CORONARY STENT INTERVENTION N/A 05/28/2023   Procedure: CORONARY STENT INTERVENTION;  Surgeon: Swaziland, Peter M, MD;  Location: [facility] INVASIVE CV LAB;  Service: Cardiovascular;  Laterality: N/A;   FINGER SURGERY     left index finger   I & D EXTREMITY  05/19/2012   Procedure: IRRIGATION AND DEBRIDEMENT EXTREMITY;  Surgeon: [name], MD;  Location: [facility] OR;  Service: Orthopedics;  Laterality: Right;  Right Great toe   I & D EXTREMITY Left 10/25/2021   Procedure: LEFT FOOT DEDRIDEMENT;  Surgeon: [name], MD;  Location: [facility] OR;  Service: Orthopedics;  Laterality: Left;   I & D Toe  05/19/2012   rt great toe   LEFT HEART CATH AND CORONARY ANGIOGRAPHY N/A 05/28/2023   Procedure: LEFT HEART CATH AND CORONARY ANGIOGRAPHY;  Surgeon: Swaziland, Peter M, MD;  Location: [facility] INVAS[facility] CV LAB;  Service: Cardiovascular;  Laterality: N/A;   TONSILLECTOMY     TRANSMETATARSAL AMPUTATION Left 12/10/2020   Procedure: TRANSMETATARSAL AMPUTATION LEFT;  Surgeon: [name], MD;  Location: [facility] OR;  Service: Orthopedics;  Laterality: Left;    Allergies  No Known Allergies  History of Present Illness    Mr. Strole comes today for ongoing assessment and management of CAD, s/p cardiac cath on 05/28/2023 with PCI using long 3.5 x 48 mm Synergy stent with Shockwave lithotripsy. Stent overlaps prior stents proximally and distally by Dr. Swaziland.  He was found to  have patent stents to the mid Cx and distal RCA. Now on DAPT with ASA and Plavix for minimum of 6 months.   Home Medications    Current Outpatient Medications  Medication Sig Dispense Refill   ALPRAZolam (XANAX) 1 MG tablet Take 1 tablet (1 mg total) by mouth 3 (three) times daily as needed for anxiety. 270 tablet 1   amLODipine (NORVASC) 10 MG tablet TAKE 1 TABLET BY MOUTH EVERY DAY 90 tablet 0   aspirin 81 MG chewable tablet Chew 1 tablet (81 mg total) by mouth daily. 90 tablet 2   cloNIDine (CATAPRES) 0.3 MG  tablet TAKE 1 TABLET BY MOUTH TWICE A DAY 180 tablet 0   clopidogrel (PLAVIX) 75 MG tablet Take 1 tablet (75 mg total) by mouth daily. 90 tablet 2   colchicine 0.6 MG tablet Take 1 tablet (0.6 mg total) by mouth every 6 (six) hours as needed (gout). 60 tablet 11   cyanocobalamin (VITAMIN B12) 1000 MCG/ML injection INJECT 1 ML (1,000 MCG TOTAL) INTO THE MUSCLE ONCE A WEEK. (Patient taking differently: Inject 1,000 mcg into the muscle every 14 (fourteen) days.) 12 mL 0   doxycycline (VIBRA-TABS) 100 MG tablet Take 100 mg by mouth daily as needed (Infection).     ezetimibe (ZETIA) 10 MG tablet TAKE 1 TABLET BY MOUTH EVERY DAY 30 tablet 11   fenofibrate 160 MG tablet TAKE 1 TABLET BY MOUTH EVERY DAY 90 tablet 0   ferrous sulfate 325 (65 FE) MG EC tablet Take 1 tablet (325 mg total) by mouth 2 (two) times daily. (Patient taking differently: Take 325 mg by mouth daily with breakfast.) 60 tablet 0   glipiZIDE (GLUCOTROL) 5 MG tablet TAKE 1 TABLET BY MOUTH TWICE A DAY (Patient taking differently: Take 5 mg by mouth daily as needed (If blood gluose is over 200/ take a bite of pill).) 180 tablet 1   hydrALAZINE (APRESOLINE) 10 MG tablet TAKE 1 TABLET (10 MG TOTAL) BY MOUTH IN THE MORNING AND AT BEDTIME. PLEASE KEEP SCHEDULED APPOINTMENT FOR FUTURE REFILLS. THANK YOU. 180 tablet 0   HYDROcodone-acetaminophen (NORCO) 10-325 MG tablet Take 1 tablet by mouth every 6 (six) hours as needed for moderate pain. 120 tablet 0   indomethacin (INDOCIN SR) 75 MG CR capsule TAKE 1 CAPSULE BY MOUTH TWICE A DAY WITH A MEAL (Patient taking differently: Take 75 mg by mouth daily as needed (Gout/ arthritis).) 60 capsule 2   isosorbide mononitrate (IMDUR) 30 MG 24 hr tablet Take 0.5 tablets (15 mg total) by mouth daily. 45 tablet 1   levothyroxine (SYNTHROID) 112 MCG tablet TAKE 1 TABLET BY MOUTH EVERY DAY 90 tablet 1   lisinopril-hydrochlorothiazide (ZESTORETIC) 20-12.5 MG tablet TAKE 1 TABLET BY MOUTH 2 (TWO) TIMES DAILY. 10AM AND  5PM 180 tablet 0   metFORMIN (GLUCOPHAGE) 1000 MG tablet TAKE 1 TABLET BY MOUTH TWO TIMES A DAY WITH A MEAL 180 tablet 1   metoprolol tartrate (LOPRESSOR) 50 MG tablet TAKE 1 TABLET BY MOUTH TWICE A DAY 180 tablet 3   mupirocin ointment (BACTROBAN) 2 % Apply 1 Application topically 2 (two) times daily. (Patient taking differently: Apply 1 Application topically daily as needed (Broken skin).) 30 g 5   nitroGLYCERIN (NITROSTAT) 0.4 MG SL tablet Place 1 tablet (0.4 mg total) under the tongue every 5 (five) minutes as needed. For chest pain. 25 tablet 5   ONETOUCH ULTRA test strip 1 EACH BY OTHER ROUTE DAILY. USE AS INSTRUCTED 100 strip 1   OVER  THE COUNTER MEDICATION Apply 1 patch topically as needed (Energy and Nutrition). Nutrition patches     pantoprazole (PROTONIX) 40 MG tablet Take 1 tablet (40 mg total) by mouth daily. 30 tablet 1   rosuvastatin (CRESTOR) 40 MG tablet TAKE 1 TABLET BY MOUTH EVERY DAY 90 tablet 1   Syringe/Needle, Disp, (SYRINGE 3CC/25GX1") 25G X 1" 3 ML MISC 1 application by Does not apply route once a week. 50 each 5   zolpidem (AMBIEN) 10 MG tablet TAKE 1 TABLET (10 MG TOTAL) BY MOUTH AT BEDTIME AS NEEDED. FOR SLEEP 90 tablet 1   No current facility-administered medications for this visit.     Family History    Family History  Problem Relation Age of Onset   Heart attack Mother    Arthritis Other    Coronary artery disease Other    Diabetes Other    Hypertension Other    Prostate cancer Other    Stroke Other    He indicated that his mother is deceased. He indicated that his father is deceased. He indicated that his sister is deceased. He indicated that only one of his four brothers is alive.  Social History    Social History   Socioeconomic History   Marital status: Married    Spouse name: Not on file   Number of children: Not on file   Years of education: Not on file   Highest education level: Bachelor's degree (e.g., BA, AB, BS)  Occupational History    Not on file  Tobacco Use   Smoking status: Never   Smokeless tobacco: Current    Types: Chew   Tobacco comments:    Occ    Patient chew occasionally   Vaping Use   Vaping status: Never Used  Substance and Sexual Activity   Alcohol use: Yes    Alcohol/week: 0.0 standard drinks of alcohol    Comment: weekends   Drug use: No   Sexual activity: Yes  Other Topics Concern   Not on file  Social History Narrative   Not on file   Social Determinants of Health   Financial Resource Strain: Medium Risk (03/11/2023)   Overall Financial Resource Strain (CARDIA)    Difficulty of Paying Living Expenses: Somewhat hard  Food Insecurity: Food Insecurity Present (03/11/2023)   Hunger Vital Sign    Worried About Running Out of Food in the Last Year: Sometimes true    Ran Out of Food in the Last Year: Never true  Transportation Needs: No Transportation Needs (03/11/2023)   PRAPARE - Administrator, Civil Service (Medical): No    Lack of Transportation (Non-Medical): No  Physical Activity: Unknown (03/11/2023)   Exercise Vital Sign    Days of Exercise per Week: Patient declined    Minutes of Exercise per Session: Not on file  Stress: Stress Concern Present (03/11/2023)   [employer] of Occupational Health - Occupational Stress Questionnaire    Feeling of Stress : Very much  Social Connections: Unknown (03/11/2023)   Social Connection and Isolation Panel [NHANES]    Frequency of Communication with Friends and Family: Twice a week    Frequency of Social Gatherings with Friends and Family: Patient declined    Attends Religious Services: Patient declined    Database administrator or Organizations: Patient declined    Attends Banker Meetings: Not on file    Marital Status: Married  Catering manager Violence: Not on file     Review of Systems  General:  No chills, fever, night sweats or weight changes.  Cardiovascular:  No chest pain, dyspnea on exertion, edema,  orthopnea, palpitations, paroxysmal nocturnal dyspnea. Dermatological: No rash, lesions/masses Respiratory: No cough, dyspnea Urologic: No hematuria, dysuria Abdominal:   No nausea, vomiting, diarrhea, bright red blood per rectum, melena, or hematemesis Neurologic:  No visual changes, wkns, changes in mental status. All other systems reviewed and are otherwise negative except as noted above.       Physical Exam    VS:  There were no vitals taken for this visit. , BMI There is no height or weight on file to calculate BMI.     GEN: Well nourished, well developed, in no acute distress. HEENT: normal. Neck: Supple, no JVD, carotid bruits, or masses. Cardiac: RRR, no murmurs, rubs, or gallops. No clubbing, cyanosis, edema.  Radials/DP/PT 2+ and equal bilaterally.  Respiratory:  Respirations regular and unlabored, clear to auscultation bilaterally. GI: Soft, nontender, nondistended, BS + x 4. MS: no deformity or atrophy. Skin: warm and dry, no rash. Neuro:  Strength and sensation are intact. Psych: Normal affect.      Lab Results  Component Value Date   WBC 4.1 05/21/2023   HGB 12.1 (L) 05/21/2023   HCT 37.1 (L) 05/21/2023   MCV 98 (H) 05/21/2023   PLT 241 05/21/2023   Lab Results  Component Value Date   CREATININE 1.07 05/21/2023   BUN 7 (L) 05/21/2023   NA 138 05/21/2023   K 3.8 05/21/2023   CL 101 05/21/2023   CO2 21 05/21/2023   Lab Results  Component Value Date   ALT 20 03/11/2023   AST 32 03/11/2023   ALKPHOS 60 03/11/2023   BILITOT 0.6 03/11/2023   Lab Results  Component Value Date   CHOL 154 03/11/2023   HDL 43.20 03/11/2023   LDLCALC 90 01/23/2022   LDLDIRECT 77.0 03/11/2023   TRIG 297.0 (H) 03/11/2023   CHOLHDL 4 03/11/2023    Lab Results  Component Value Date   HGBA1C 6.8 (H) 03/11/2023     Review of Prior Studies  Cardiac Catheterization June 03, 2023:     Ost LM to Dist LM lesion is 55% stenosed.   Mid LAD lesion is 40% stenosed.   Prox Cx to  Mid Cx lesion is 10% stenosed.   3rd Mrg lesion is 45% stenosed.   Prox RCA lesion is 25% stenosed.   Prox RCA to Mid RCA lesion is 90% stenosed.   Previously placed Dist RCA stent of unknown type is  widely patent.   A drug-eluting stent was successfully placed using a SYNERGY XD 3.50X48.   Post intervention, there is a 0% residual stenosis.   Post intervention, there is a 0% residual stenosis.   Post intervention, there is a 0% residual stenosis.   LV end diastolic pressure is moderately elevated.   Recommend uninterrupted dual antiplatelet therapy with Aspirin 81mg  daily and Clopidogrel 75mg  daily for a minimum of 6 months (stable ischemic heart disease-Class I recommendation).   Patent stents in the mid LCx, proximal and distal RCA Severe stenosis in mid RCA between prior stents Moderate left main CAD Moderately elevated LVEDP 25 mm Hg Successful PCI of the mid RCA with long 3.5 x 48 mm Synergy stent with Shockwave lithotripsy. Stent overlaps prior stents proximally and distally.   Plan: DAPT for a minimum of 6 months. Anticipate same day DC   Diagnostic Dominance: Right  Intervention       Assessment & Plan  1.  ***     {Are you ordering a CV Procedure (e.g. stress test, cath, DCCV, TEE, etc)?   Press F2        :[id number]}   Signed, Bettey Mare. Liborio Nixon, ANP, AACC   06/03/2023 8:10 AM      Office [phone] Fax [phone]  Notice: This dictation was prepared with Dragon dictation along with smaller phrase technology. Any transcriptional errors that result from this process are unintentional and may not be corrected upon review.

## 2023-06-03 NOTE — Telephone Encounter (Signed)
Per Phase 1 Cardiac rehab fax referral to [facility].

## 2023-06-06 ENCOUNTER — Ambulatory Visit: Payer: No Typology Code available for payment source | Attending: Adult Health | Admitting: Adult Health

## 2023-06-10 ENCOUNTER — Encounter: Payer: Self-pay | Admitting: Family Medicine

## 2023-06-11 NOTE — Telephone Encounter (Signed)
Pt Last virtual visit was on 03/29/23 Last refill was done on 03/11/23 Please advise

## 2023-06-12 MED ORDER — HYDROCODONE-ACETAMINOPHEN 10-325 MG PO TABS: 1.0000 | ORAL_TABLET | Freq: Four times a day (QID) | ORAL | 0 refills | Status: DC | PRN

## 2023-06-12 NOTE — Addendum Note (Signed)
Addended by: Gershon Crane A on: 06/12/2023 07:50 AM   Modules accepted: Orders

## 2023-06-12 NOTE — Telephone Encounter (Signed)
Done

## 2023-06-13 ENCOUNTER — Other Ambulatory Visit: Payer: Self-pay | Admitting: Family Medicine

## 2023-06-19 ENCOUNTER — Other Ambulatory Visit: Payer: Self-pay | Admitting: Family Medicine

## 2023-06-19 ENCOUNTER — Other Ambulatory Visit: Payer: Self-pay | Admitting: Nurse Practitioner

## 2023-06-19 DIAGNOSIS — I10 Essential (primary) hypertension: Secondary | ICD-10-CM

## 2023-06-27 ENCOUNTER — Other Ambulatory Visit (HOSPITAL_COMMUNITY): Payer: Self-pay

## 2023-07-09 ENCOUNTER — Other Ambulatory Visit: Payer: Self-pay | Admitting: Family Medicine

## 2023-07-09 ENCOUNTER — Other Ambulatory Visit: Payer: Self-pay | Admitting: Cardiology

## 2023-07-14 NOTE — Progress Notes (Unsigned)
Cardiology Office Note:  .   Date:  07/16/2023  ID:  Robert Huang, DOB 28-Oct-1960, MRN [id number] PCP: [name], MD  [facility] Providers Cardiologist:  [name], MD:1}   }   History of Present Illness: .   Robert Huang is a 62 y.o. male we are seeing for posthospitalization follow-up after admission on 05/28/2023 with discharge same day in the setting of chest pain.  Patient underwent left heart catheterization on 05/28/2023 revealing patent stents in the mid left circumflex proximal and distal RCA with severe stenosis in the mid RCA between prior stents, moderate left main CAD.  Patient is status post PCI of the mid RCA with a long 3.5 x 48 mm Synergy stent with shockwave lithotripsy and overlapping prior stents proximally and distally.  The patient was to stay on dual antiplatelet therapy for minimum of 6 months.  He comes today without any recurrent chest discomfort, the patient main complaint was profound fatigue.  He is tolerating the clopidogrel without issues of bleeding.  His main complaint is generalized fatigue he is still not feeling very energetic.  ROS: As above otherwise negative.  Studies Reviewed: .    Cardiac Catheterization 05/28/2023:     Ost LM to Dist LM lesion is 55% stenosed.   Mid LAD lesion is 40% stenosed.   Prox Cx to Mid Cx lesion is 10% stenosed.   3rd Mrg lesion is 45% stenosed.   Prox RCA lesion is 25% stenosed.   Prox RCA to Mid RCA lesion is 90% stenosed.   Previously placed Dist RCA stent of unknown type is  widely patent.   A drug-eluting stent was successfully placed using a SYNERGY XD 3.50X48.   Post intervention, there is a 0% residual stenosis.   Post intervention, there is a 0% residual stenosis.   Post intervention, there is a 0% residual stenosis.   LV end diastolic pressure is moderately elevated.   Recommend uninterrupted dual antiplatelet therapy with Aspirin 81mg  daily and Clopidogrel 75mg  daily for a  minimum of 6 months (stable ischemic heart disease-Class I recommendation).   Patent stents in the mid LCx, proximal and distal RCA Severe stenosis in mid RCA between prior stents Moderate left main CAD Moderately elevated LVEDP 25 mm Hg Successful PCI of the mid RCA with long 3.5 x 48 mm Synergy stent with Shockwave lithotripsy. Stent overlaps prior stents proximally and distally.   Plan: DAPT for a minimum of 6 months. Anticipate same day DC   Diagnostic Dominance: Right  Intervention      EKG Interpretation Date/Time:  Tuesday July 16 2023 11:01:18 EST Ventricular Rate:  46 PR Interval:  226 QRS Duration:  80 QT Interval:  494 QTC Calculation: 432 R Axis:   0  Text Interpretation: Unusual P axis, possible ectopic atrial bradycardia When compared with ECG of 28-May-2023 09:29, Ectopic atrial rhythm has replaced Sinus rhythm QRS axis Shifted right Confirmed by Joni Reining [phone]) on 07/16/2023 11:38:28 AM    Physical Exam:   VS:  BP 130/70   Pulse (!) 46   Ht 6' (1.829 m)   Wt 202 lb (91.6 kg)   SpO2 99%   BMI 27.40 kg/m    Wt Readings from Last 3 Encounters:  07/16/23 202 lb (91.6 kg)  05/28/23 200 lb (90.7 kg)  05/16/23 200 lb (90.7 kg)    GEN: Well nourished, well developed in no acute distress NECK: No JVD; No carotid bruits CARDIAC: RRR, bradycardic no  murmurs, rubs, gallops RESPIRATORY:  Clear to auscultation without rales, wheezing or rhonchi  ABDOMEN: Soft, non-tender, non-distended EXTREMITIES:  No edema; right wrist catheterization insertion site is well-healed without evidence of hematoma or bleeding.  No deformity prostatic lower left leg.  ASSESSMENT AND PLAN: .    Coronary artery disease: Status post cardiac catheterization 05/28/2023 with 90% proximal to mid RCA lesion status post Synergy XD 3.5 x 48 mm drug-eluting stent.  The patient will remain on dual antiplatelet therapy for a minimum of 6 months.  He is tolerating regimen at this  time.  The patient has not been taking isosorbide as he has not had any recurrence of discomfort.  He will have his prescription should this recur but he does not need to continue to take it currently.  2.  Bradycardia: Patient is on metoprolol tartrate 50 mg twice daily.  I will reduce this to 25 mg twice daily in the setting of bradycardia and fatigue.  Hopefully this will help his lethargy.  He will follow-up with Dr. [name] in 3 months to evaluate his status.  3.  Hypercholesterolemia: Remains on rosuvastatin 40 mg daily.  Goal of LDL less than 70.  Most recent labs on 03/11/2023 LDL 77, HDL 43, total cholesterol 865.  He will need to have follow-up lipids and LFTs on next office visit.  He is going to see his PCP today and they can be drawn there as well.    4.  Hypertension: Multiple medications for blood pressure control to include lisinopril HCTZ, amlodipine, clonidine and hydralazine.  Blood pressure is well-controlled today.  I do not want to make any changes currently besides the aforementioned decrease in metoprolol due to bradycardia.  Continue to monitor blood pressure at home.  He is to report any significant elevations of blood pressure greater than 160/90.   Signed, Bettey Mare. Liborio Nixon, ANP, AACC

## 2023-07-16 ENCOUNTER — Encounter: Payer: Self-pay | Admitting: Family Medicine

## 2023-07-16 ENCOUNTER — Ambulatory Visit: Payer: No Typology Code available for payment source | Attending: Adult Health | Admitting: Adult Health

## 2023-07-16 ENCOUNTER — Encounter: Payer: Self-pay | Admitting: Adult Health

## 2023-07-16 ENCOUNTER — Ambulatory Visit (INDEPENDENT_AMBULATORY_CARE_PROVIDER_SITE_OTHER): Payer: No Typology Code available for payment source | Admitting: Family Medicine

## 2023-07-16 VITALS — BP 130/70 | HR 46 | Ht 72.0 in | Wt 202.0 lb

## 2023-07-16 VITALS — BP 110/60 | HR 54 | Temp 97.6°F | Wt 201.0 lb

## 2023-07-16 DIAGNOSIS — I10 Essential (primary) hypertension: Secondary | ICD-10-CM | POA: Diagnosis not present

## 2023-07-16 DIAGNOSIS — M150 Primary generalized (osteo)arthritis: Secondary | ICD-10-CM | POA: Diagnosis not present

## 2023-07-16 DIAGNOSIS — Z955 Presence of coronary angioplasty implant and graft: Secondary | ICD-10-CM | POA: Diagnosis not present

## 2023-07-16 DIAGNOSIS — N182 Chronic kidney disease, stage 2 (mild): Secondary | ICD-10-CM

## 2023-07-16 DIAGNOSIS — J3089 Other allergic rhinitis: Secondary | ICD-10-CM

## 2023-07-16 DIAGNOSIS — I2510 Atherosclerotic heart disease of native coronary artery without angina pectoris: Secondary | ICD-10-CM

## 2023-07-16 DIAGNOSIS — E7800 Pure hypercholesterolemia, unspecified: Secondary | ICD-10-CM

## 2023-07-16 DIAGNOSIS — Z7984 Long term (current) use of oral hypoglycemic drugs: Secondary | ICD-10-CM

## 2023-07-16 DIAGNOSIS — J309 Allergic rhinitis, unspecified: Secondary | ICD-10-CM | POA: Insufficient documentation

## 2023-07-16 DIAGNOSIS — E118 Type 2 diabetes mellitus with unspecified complications: Secondary | ICD-10-CM | POA: Diagnosis not present

## 2023-07-16 DIAGNOSIS — R001 Bradycardia, unspecified: Secondary | ICD-10-CM

## 2023-07-16 LAB — POCT GLYCOSYLATED HEMOGLOBIN (HGB A1C): Hemoglobin A1C: 6.7 % — AB (ref 4.0–5.6)

## 2023-07-16 MED ORDER — METOPROLOL TARTRATE 25 MG PO TABS: 25.0000 mg | ORAL_TABLET | Freq: Two times a day (BID) | ORAL | 3 refills | Status: DC

## 2023-07-16 MED ORDER — FLUTICASONE PROPIONATE 50 MCG/ACT NA SUSP: 2.0000 | Freq: Every day | NASAL | 3 refills | Status: DC

## 2023-07-16 MED ORDER — METFORMIN HCL 500 MG PO TABS: 1000.0000 mg | ORAL_TABLET | Freq: Two times a day (BID) | ORAL | 3 refills | Status: DC

## 2023-07-16 NOTE — Patient Instructions (Addendum)
Medication Instructions:  Decrease Metoprolol Tartrate to 25 mg ( Take 1 Tablet Twice Daily). *If you need a refill on your cardiac medications before your next appointment, please call your pharmacy*   Lab Work: No labs If you have labs (blood work) drawn today and your tests are completely normal, you will receive your results only by: MyChart Message (if you have MyChart) OR A paper copy in the mail If you have any lab test that is abnormal or we need to change your treatment, we will call you to review the results.   Testing/Procedures: No Testing   Follow-Up: At [facility], you and your health needs are our priority.  As part of our continuing mission to provide you with exceptional heart care, we have created designated Provider Care Teams.  These Care Teams include your primary Cardiologist (physician) and Advanced Practice Providers (APPs -  Physician Assistants and Nurse Practitioners) who all work together to provide you with the care you need, when you need it.  We recommend signing up for the patient portal called "MyChart".  Sign up information is provided on this After Visit Summary.  MyChart is used to connect with patients for Virtual Visits (Telemedicine).  Patients are able to view lab/test results, encounter notes, upcoming appointments, etc.  Non-urgent messages can be sent to your provider as well.   To learn more about what you can do with MyChart, go to [email or web address].    Your next appointment:   3 month(s) virtual  Provider:   [name], MD

## 2023-07-16 NOTE — Progress Notes (Signed)
   Subjective:    Patient ID: Robert Huang [id number], male    DOB: 1961-03-21, 62 y.o.   MRN: [id number]  HPI Here to follow up on a hospital visit on 05-28-23 for a left heart catheterization, and other issues. He had presented with left chest and left shoulder pain. The cath, per Dr. [name], showed a 90% stenosis in the mid RCA between 2 previous stents. These were replaced with one longer stent successfully. He has felt fine since then. He had a follow up visit with Cardiology this morning. His BP has been stable. His last A1c in July was 6.8%, and today this is 6.7%. he says the 1000 mg Metformin pills are hard to swallow and he asks for smaller ones. He also complains of daily nasal drainage of clear fluid. He uses Flonase daily.    Review of Systems  Constitutional: Negative.   HENT:  Positive for rhinorrhea. Negative for congestion, ear pain, sinus pressure and sore throat.   Respiratory: Negative.    Cardiovascular: Negative.        Objective:   Physical Exam Constitutional:      Appearance: Normal appearance.  HENT:     Right Ear: Tympanic membrane, ear canal and external ear normal.     Left Ear: Tympanic membrane, ear canal and external ear normal.     Nose: Nose normal.     Mouth/Throat:     Pharynx: Oropharynx is clear.  Eyes:     Conjunctiva/sclera: Conjunctivae normal.  Cardiovascular:     Rate and Rhythm: Normal rate and regular rhythm.     Pulses: Normal pulses.     Heart sounds: Normal heart sounds.  Pulmonary:     Effort: Pulmonary effort is normal.     Breath sounds: Normal breath sounds.  Lymphadenopathy:     Cervical: No cervical adenopathy.  Neurological:     Mental Status: He is alert.           Assessment & Plan:  His CAD and HTN are well controlled. His diabetes is well controlled. We will change the Metformin pills to the 500 mg size, and he will take 2 of these BID. For the nasal drinage, he can add Zyrtec daily. We spent a total of ( 32  )  minutes reviewing records and discussing these issues.  Gershon Crane, MD

## 2023-07-17 ENCOUNTER — Encounter: Payer: Self-pay | Admitting: Family Medicine

## 2023-07-17 ENCOUNTER — Telehealth (INDEPENDENT_AMBULATORY_CARE_PROVIDER_SITE_OTHER): Payer: No Typology Code available for payment source | Admitting: Family Medicine

## 2023-07-17 DIAGNOSIS — G546 Phantom limb syndrome with pain: Secondary | ICD-10-CM | POA: Diagnosis not present

## 2023-07-17 DIAGNOSIS — F1190 Opioid use, unspecified, uncomplicated: Secondary | ICD-10-CM | POA: Diagnosis not present

## 2023-07-17 MED ORDER — HYDROCODONE-ACETAMINOPHEN 10-325 MG PO TABS: 1.0000 | ORAL_TABLET | Freq: Four times a day (QID) | ORAL | 0 refills | Status: DC | PRN

## 2023-07-17 MED ORDER — GABAPENTIN 100 MG PO CAPS: 100.0000 mg | ORAL_CAPSULE | Freq: Three times a day (TID) | ORAL | 5 refills | Status: DC

## 2023-07-17 NOTE — Progress Notes (Signed)
Subjective:    Patient ID: Robert Huang [id number], male    DOB: 1961/07/24, 62 y.o.   MRN: [id number]  HPI Virtual Visit via Video Note  I connected with the patient on 07/17/23 at  9:45 AM EST by a video enabled telemedicine application and verified that I am speaking with the correct person using two identifiers.  Location patient: home Location provider:work or home office Persons participating in the virtual visit: patient, provider  I discussed the limitations of evaluation and management by telemedicine and the availability of in person appointments. The patient expressed understanding and agreed to proceed.   HPI: Here for pain management. He is doing about the same as far as the phantom pains go. He also has some diabetic neuropathy in the right foot.    ROS: See pertinent positives and negatives per HPI.  Past Medical History:  Diagnosis Date   Anxiety    CAD (coronary artery disease)    sees Dr. [name], normal Stress test 07-16-12    Diabetes mellitus    DJD (degenerative joint disease)    Gout    Hyperlipidemia    Hypertension    Hypothyroid    Osteoarthritis    Renal calculus    hx   Umbilical hernia     Past Surgical History:  Procedure Laterality Date   AMPUTATION  07/17/2012   Procedure: AMPUTATION RAY;  Surgeon: [name], MD;  Location: [facility] OR;  Service: Orthopedics;  Laterality: Right;  right hallux amputation (1st ray resection )   AMPUTATION Left 03/06/2019   Procedure: Ray Amputation left foot;  Surgeon: [name], MD;  Location: [facility] ORS;  Service: Orthopedics;  Laterality: Left;   AMPUTATION Left 10/27/2021   Procedure: LEFT BELOW KNEE AMPUTATION;  Surgeon: [name], MD;  Location: [facility] OR;  Service: Orthopedics;  Laterality: Left;   CORONARY ANGIOPLASTY WITH STENT PLACEMENT     CORONARY LITHOTRIPSY N/A 05/28/2023   Procedure: CORONARY LITHOTRIPSY;  Surgeon: Swaziland, Peter M, MD;  Location: [facility] INVASIVE CV LAB;  Service:  Cardiovascular;  Laterality: N/A;   CORONARY STENT INTERVENTION N/A 05/28/2023   Procedure: CORONARY STENT INTERVENTION;  Surgeon: Swaziland, Peter M, MD;  Location: [facility] INVASIVE CV LAB;  Service: Cardiovascular;  Laterality: N/A;   FINGER SURGERY     left index finger   I & D EXTREMITY  05/19/2012   Procedure: IRRIGATION AND DEBRIDEMENT EXTREMITY;  Surgeon: [name], MD;  Location: [facility] OR;  Service: Orthopedics;  Laterality: Right;  Right Great toe   I & D EXTREMITY Left 10/25/2021   Procedure: LEFT FOOT DEDRIDEMENT;  Surgeon: [name], MD;  Location: [facility] OR;  Service: Orthopedics;  Laterality: Left;   I & D Toe  05/19/2012   rt great toe   LEFT HEART CATH AND CORONARY ANGIOGRAPHY N/A 05/28/2023   Procedure: LEFT HEART CATH AND CORONARY ANGIOGRAPHY;  Surgeon: Swaziland, Peter M, MD;  Location: [facility] INVASIVE CV LAB;  Service: Cardiovascular;  Laterality: N/A;   TONSILLECTOMY     TRANSMETATARSAL AMPUTATION Left 12/10/2020   Procedure: TRANSMETATARSAL AMPUTATION LEFT;  Surgeon: [name], MD;  Location: [facility] OR;  Service: Orthopedics;  Laterality: Left;    Family History  Problem Relation Age of Onset   Heart attack Mother    Arthritis Other    Coronary artery disease Other    Diabetes Other    Hypertension Other    Prostate cancer Other    Stroke Other  Current Outpatient Medications:    ALPRAZolam (XANAX) 1 MG tablet, Take 1 tablet (1 mg total) by mouth 3 (three) times daily as needed for anxiety., Disp: 270 tablet, Rfl: 1   amLODipine (NORVASC) 10 MG tablet, TAKE 1 TABLET BY MOUTH EVERY DAY, Disp: 90 tablet, Rfl: 0   aspirin 81 MG chewable tablet, Chew 1 tablet (81 mg total) by mouth daily., Disp: 90 tablet, Rfl: 2   cloNIDine (CATAPRES) 0.3 MG tablet, TAKE 1 TABLET BY MOUTH TWICE A DAY, Disp: 180 tablet, Rfl: 0   clopidogrel (PLAVIX) 75 MG tablet, Take 1 tablet (75 mg total) by mouth daily., Disp: 90 tablet, Rfl: 2   colchicine 0.6 MG tablet, Take 1 tablet (0.6 mg total) by  mouth every 6 (six) hours as needed (gout)., Disp: 60 tablet, Rfl: 11   cyanocobalamin (VITAMIN B12) 1000 MCG/ML injection, INJECT 1 ML (1,000 MCG TOTAL) INTO THE MUSCLE ONCE A WEEK., Disp: 12 mL, Rfl: 0   ezetimibe (ZETIA) 10 MG tablet, TAKE 1 TABLET BY MOUTH EVERY DAY, Disp: 30 tablet, Rfl: 11   fenofibrate 160 MG tablet, TAKE 1 TABLET BY MOUTH EVERY DAY, Disp: 90 tablet, Rfl: 0   fluticasone (FLONASE) 50 MCG/ACT nasal spray, Place 2 sprays into both nostrils daily., Disp: 48 g, Rfl: 3   glipiZIDE (GLUCOTROL) 5 MG tablet, TAKE 1 TABLET BY MOUTH TWICE A DAY (Patient taking differently: Take 5 mg by mouth daily as needed (If blood gluose is over 200/ take a bite of pill).), Disp: 180 tablet, Rfl: 1   hydrALAZINE (APRESOLINE) 10 MG tablet, Take 1 tablet (10 mg total) by mouth in the morning and at bedtime., Disp: 180 tablet, Rfl: 2   HYDROcodone-acetaminophen (NORCO) 10-325 MG tablet, Take 1 tablet by mouth every 6 (six) hours as needed for moderate pain (pain score 4-6)., Disp: 120 tablet, Rfl: 0   indomethacin (INDOCIN SR) 75 MG CR capsule, TAKE 1 CAPSULE BY MOUTH TWICE A DAY WITH A MEAL (Patient taking differently: Take 75 mg by mouth daily as needed (Gout/ arthritis).), Disp: 60 capsule, Rfl: 2   levothyroxine (SYNTHROID) 112 MCG tablet, TAKE 1 TABLET BY MOUTH EVERY DAY, Disp: 90 tablet, Rfl: 1   lisinopril-hydrochlorothiazide (ZESTORETIC) 20-12.5 MG tablet, TAKE 1 TABLET BY MOUTH 2 (TWO) TIMES DAILY. 10AM AND 5PM, Disp: 180 tablet, Rfl: 0   metFORMIN (GLUCOPHAGE) 500 MG tablet, Take 2 tablets (1,000 mg total) by mouth 2 (two) times daily with a meal., Disp: 360 tablet, Rfl: 3   metoprolol tartrate (LOPRESSOR) 25 MG tablet, Take 1 tablet (25 mg total) by mouth 2 (two) times daily., Disp: 180 tablet, Rfl: 3   mupirocin ointment (BACTROBAN) 2 %, Apply 1 Application topically 2 (two) times daily. (Patient taking differently: Apply 1 Application topically daily as needed (Broken skin).), Disp: 30 g, Rfl:  5   nitroGLYCERIN (NITROSTAT) 0.4 MG SL tablet, Place 1 tablet (0.4 mg total) under the tongue every 5 (five) minutes as needed. For chest pain., Disp: 25 tablet, Rfl: 5   ONETOUCH ULTRA test strip, 1 EACH BY OTHER ROUTE DAILY. USE AS INSTRUCTED, Disp: 100 strip, Rfl: 1   OVER THE COUNTER MEDICATION, Apply 1 patch topically as needed (Energy and Nutrition). Nutrition patches, Disp: , Rfl:    pantoprazole (PROTONIX) 40 MG tablet, TAKE 1 TABLET BY MOUTH EVERY DAY, Disp: 30 tablet, Rfl: 9   rosuvastatin (CRESTOR) 40 MG tablet, TAKE 1 TABLET BY MOUTH EVERY DAY, Disp: 90 tablet, Rfl: 1   Syringe/Needle, Disp, (SYRINGE  3CC/25GX1") 25G X 1" 3 ML MISC, 1 application by Does not apply route once a week., Disp: 50 each, Rfl: 5   zolpidem (AMBIEN) 10 MG tablet, TAKE 1 TABLET (10 MG TOTAL) BY MOUTH AT BEDTIME AS NEEDED. FOR SLEEP, Disp: 90 tablet, Rfl: 1   ferrous sulfate 325 (65 FE) MG EC tablet, Take 1 tablet (325 mg total) by mouth 2 (two) times daily. (Patient taking differently: Take 325 mg by mouth daily with breakfast.), Disp: 60 tablet, Rfl: 0  EXAM:  VITALS per patient if applicable:  GENERAL: alert, oriented, appears well and in no acute distress  HEENT: atraumatic, conjunttiva clear, no obvious abnormalities on inspection of external nose and ears  NECK: normal movements of the head and neck  LUNGS: on inspection no signs of respiratory distress, breathing rate appears normal, no obvious gross SOB, gasping or wheezing  CV: no obvious cyanosis  MS: moves all visible extremities without noticeable abnormality  PSYCH/NEURO: pleasant and cooperative, no obvious depression or anxiety, speech and thought processing grossly intact  ASSESSMENT AND PLAN: Pain management. Indication for chronic opioid: phantom limb pain  Medication and dose: Norco 10-325 # pills per month: 120 Last UDS date: 12-18-22 Opioid Treatment Agreement signed (Y/N): 12-10-18 Opioid Treatment Agreement last reviewed with  patient:  07-17-23 [facility] reviewed this encounter (include red flags): Yes The Norco was refilled. We will add Gabapentin 100 mg TID to this. He will report back in 2 weeks.  Gershon Crane, MD  Discussed the following assessment and plan:  No diagnosis found.     I discussed the assessment and treatment plan with the patient. The patient was provided an opportunity to ask questions and all were answered. The patient agreed with the plan and demonstrated an understanding of the instructions.   The patient was advised to call back or seek an in-person evaluation if the symptoms worsen or if the condition fails to improve as anticipated.      Review of Systems     Objective:   Physical Exam        Assessment & Plan:

## 2023-08-14 ENCOUNTER — Encounter: Payer: Self-pay | Admitting: Family Medicine

## 2023-08-14 NOTE — Telephone Encounter (Signed)
He can pick up the next refills on 08-16-23

## 2023-08-15 ENCOUNTER — Other Ambulatory Visit: Payer: Self-pay | Admitting: Family Medicine

## 2023-08-15 DIAGNOSIS — E785 Hyperlipidemia, unspecified: Secondary | ICD-10-CM

## 2023-09-05 ENCOUNTER — Other Ambulatory Visit: Payer: Self-pay | Admitting: Family Medicine

## 2023-09-05 DIAGNOSIS — I10 Essential (primary) hypertension: Secondary | ICD-10-CM

## 2023-09-14 ENCOUNTER — Other Ambulatory Visit: Payer: Self-pay | Admitting: Family Medicine

## 2023-09-20 ENCOUNTER — Other Ambulatory Visit: Payer: Self-pay | Admitting: Family Medicine

## 2023-09-20 DIAGNOSIS — I10 Essential (primary) hypertension: Secondary | ICD-10-CM

## 2023-10-16 ENCOUNTER — Telehealth: Payer: Self-pay | Admitting: Family Medicine

## 2023-10-16 ENCOUNTER — Other Ambulatory Visit: Payer: Self-pay | Admitting: Cardiology

## 2023-10-16 ENCOUNTER — Other Ambulatory Visit: Payer: Self-pay | Admitting: Family Medicine

## 2023-10-16 DIAGNOSIS — I10 Essential (primary) hypertension: Secondary | ICD-10-CM

## 2023-10-17 ENCOUNTER — Telehealth: Payer: Self-pay | Admitting: Family Medicine

## 2023-10-17 ENCOUNTER — Encounter: Payer: Self-pay | Admitting: Family Medicine

## 2023-10-17 DIAGNOSIS — F1190 Opioid use, unspecified, uncomplicated: Secondary | ICD-10-CM

## 2023-10-17 DIAGNOSIS — G546 Phantom limb syndrome with pain: Secondary | ICD-10-CM

## 2023-10-17 MED ORDER — GABAPENTIN 300 MG PO CAPS: 300.0000 mg | ORAL_CAPSULE | Freq: Three times a day (TID) | ORAL | 5 refills | Status: DC

## 2023-10-17 MED ORDER — HYDROCODONE-ACETAMINOPHEN 10-325 MG PO TABS: 1.0000 | ORAL_TABLET | Freq: Four times a day (QID) | ORAL | 0 refills | Status: DC | PRN

## 2023-10-17 NOTE — Progress Notes (Signed)
 Subjective:    Patient ID: Robert Huang, male    DOB: 08/20/1961, 63 y.o.   MRN: [id number]  HPI Virtual Visit via Video Note  I connected with the patient on 10/17/23 at  9:15 AM EST by a video enabled telemedicine application and verified that I am speaking with the correct person using two identifiers.  Location patient: home Location provider:work or home office Persons participating in the virtual visit: patient, provider  I discussed the limitations of evaluation and management by telemedicine and the availability of in person appointments. The patient expressed understanding and agreed to proceed.   HPI: Here for pain management. His phantom limb pain is about the same. He has been taking Gabapentin 100 mg TID as well, but he feels little effects from this. He also mentions stiffness and pain in both hands.    ROS: See pertinent positives and negatives per HPI.  Past Medical History:  Diagnosis Date   Anxiety    CAD (coronary artery disease)    sees Dr. [name], normal Stress test 07-16-12    Diabetes mellitus    DJD (degenerative joint disease)    Gout    Hyperlipidemia    Hypertension    Hypothyroid    Osteoarthritis    Renal calculus    hx   Umbilical hernia     Past Surgical History:  Procedure Laterality Date   AMPUTATION  07/17/2012   Procedure: AMPUTATION RAY;  Surgeon: [name], MD;  Location: [facility] OR;  Service: Orthopedics;  Laterality: Right;  right hallux amputation (1st ray resection )   AMPUTATION Left 03/06/2019   Procedure: Ray Amputation left foot;  Surgeon: [name], MD;  Location: [facility] ORS;  Service: Orthopedics;  Laterality: Left;   AMPUTATION Left 10/27/2021   Procedure: LEFT BELOW KNEE AMPUTATION;  Surgeon: [name], MD;  Location: [facility] OR;  Service: Orthopedics;  Laterality: Left;   CORONARY ANGIOPLASTY WITH STENT PLACEMENT     CORONARY LITHOTRIPSY N/A 05/28/2023   Procedure: CORONARY LITHOTRIPSY;  Surgeon: Swaziland,  Peter M, MD;  Location: [facility] INVASIVE CV LAB;  Service: Cardiovascular;  Laterality: N/A;   CORONARY STENT INTERVENTION N/A 05/28/2023   Procedure: CORONARY STENT INTERVENTION;  Surgeon: Swaziland, Peter M, MD;  Location: [facility] INVASIVE CV LAB;  Service: Cardiovascular;  Laterality: N/A;   FINGER SURGERY     left index finger   I & D EXTREMITY  05/19/2012   Procedure: IRRIGATION AND DEBRIDEMENT EXTREMITY;  Surgeon: [name], MD;  Location: [facility] OR;  Service: Orthopedics;  Laterality: Right;  Right Great toe   I & D EXTREMITY Left 10/25/2021   Procedure: LEFT FOOT DEDRIDEMENT;  Surgeon: [name], MD;  Location: [facility] OR;  Service: Orthopedics;  Laterality: Left;   I & D Toe  05/19/2012   rt great toe   LEFT HEART CATH AND CORONARY ANGIOGRAPHY N/A 05/28/2023   Procedure: LEFT HEART CATH AND CORONARY ANGIOGRAPHY;  Surgeon: Swaziland, Peter M, MD;  Location: [facility] INVASIVE CV LAB;  Service: Cardiovascular;  Laterality: N/A;   TONSILLECTOMY     TRANSMETATARSAL AMPUTATION Left 12/10/2020   Procedure: TRANSMETATARSAL AMPUTATION LEFT;  Surgeon: [name], MD;  Location: [facility] OR;  Service: Orthopedics;  Laterality: Left;    Family History  Problem Relation Age of Onset   Heart attack Mother    Arthritis Other    Coronary artery disease Other    Diabetes Other    Hypertension Other    Prostate cancer  Other    Stroke Other      Current Outpatient Medications:    ALPRAZolam (XANAX) 1 MG tablet, Take 1 tablet (1 mg total) by mouth 3 (three) times daily as needed for anxiety., Disp: 270 tablet, Rfl: 1   amLODipine (NORVASC) 10 MG tablet, TAKE 1 TABLET BY MOUTH EVERY DAY, Disp: 90 tablet, Rfl: 0   aspirin 81 MG chewable tablet, Chew 1 tablet (81 mg total) by mouth daily., Disp: 90 tablet, Rfl: 2   cloNIDine (CATAPRES) 0.3 MG tablet, TAKE 1 TABLET BY MOUTH TWICE A DAY, Disp: 180 tablet, Rfl: 0   clopidogrel (PLAVIX) 75 MG tablet, TAKE 1 TABLET BY MOUTH EVERY DAY, Disp: 90 tablet, Rfl: 2   colchicine 0.6 MG  tablet, Take 1 tablet (0.6 mg total) by mouth every 6 (six) hours as needed (gout)., Disp: 60 tablet, Rfl: 11   cyanocobalamin (VITAMIN B12) 1000 MCG/ML injection, INJECT 1 ML INTO THE MUSCLE ONCE A WEEK, Disp: 12 mL, Rfl: 0   ezetimibe (ZETIA) 10 MG tablet, TAKE 1 TABLET BY MOUTH EVERY DAY, Disp: 30 tablet, Rfl: 11   fenofibrate 160 MG tablet, TAKE 1 TABLET BY MOUTH EVERY DAY, Disp: 90 tablet, Rfl: 0   ferrous sulfate 325 (65 FE) MG EC tablet, Take 1 tablet (325 mg total) by mouth 2 (two) times daily. (Patient taking differently: Take 325 mg by mouth daily with breakfast.), Disp: 60 tablet, Rfl: 0   fluticasone (FLONASE) 50 MCG/ACT nasal spray, Place 2 sprays into both nostrils daily., Disp: 48 g, Rfl: 3   gabapentin (NEURONTIN) 100 MG capsule, Take 1 capsule (100 mg total) by mouth 3 (three) times daily., Disp: 90 capsule, Rfl: 5   glipiZIDE (GLUCOTROL) 5 MG tablet, TAKE 1 TABLET BY MOUTH TWICE A DAY (Patient taking differently: Take 5 mg by mouth daily as needed (If blood gluose is over 200/ take a bite of pill).), Disp: 180 tablet, Rfl: 1   hydrALAZINE (APRESOLINE) 10 MG tablet, Take 1 tablet (10 mg total) by mouth in the morning and at bedtime., Disp: 180 tablet, Rfl: 2   HYDROcodone-acetaminophen (NORCO) 10-325 MG tablet, Take 1 tablet by mouth every 6 (six) hours as needed for moderate pain (pain score 4-6)., Disp: 120 tablet, Rfl: 0   HYDROcodone-acetaminophen (NORCO) 10-325 MG tablet, Take 1 tablet by mouth every 6 (six) hours as needed for moderate pain (pain score 4-6)., Disp: 120 tablet, Rfl: 0   HYDROcodone-acetaminophen (NORCO) 10-325 MG tablet, Take 1 tablet by mouth every 6 (six) hours as needed for moderate pain (pain score 4-6)., Disp: 120 tablet, Rfl: 0   indomethacin (INDOCIN SR) 75 MG CR capsule, TAKE 1 CAPSULE BY MOUTH TWICE A DAY WITH A MEAL (Patient taking differently: Take 75 mg by mouth daily as needed (Gout/ arthritis).), Disp: 60 capsule, Rfl: 2   levothyroxine (SYNTHROID)  112 MCG tablet, TAKE 1 TABLET BY MOUTH EVERY DAY, Disp: 90 tablet, Rfl: 0   lisinopril-hydrochlorothiazide (ZESTORETIC) 20-12.5 MG tablet, TAKE 1 TABLET TWICE TIMES DAILY AT 10AM AND 5PM, Disp: 180 tablet, Rfl: 0   metFORMIN (GLUCOPHAGE) 500 MG tablet, Take 2 tablets (1,000 mg total) by mouth 2 (two) times daily with a meal., Disp: 360 tablet, Rfl: 3   metoprolol tartrate (LOPRESSOR) 25 MG tablet, Take 1 tablet (25 mg total) by mouth 2 (two) times daily., Disp: 180 tablet, Rfl: 3   mupirocin ointment (BACTROBAN) 2 %, Apply 1 Application topically 2 (two) times daily. (Patient taking differently: Apply 1 Application topically daily  as needed (Broken skin).), Disp: 30 g, Rfl: 5   nitroGLYCERIN (NITROSTAT) 0.4 MG SL tablet, Place 1 tablet (0.4 mg total) under the tongue every 5 (five) minutes as needed. For chest pain., Disp: 25 tablet, Rfl: 5   ONETOUCH ULTRA test strip, 1 EACH BY OTHER ROUTE DAILY. USE AS INSTRUCTED, Disp: 100 strip, Rfl: 1   OVER THE COUNTER MEDICATION, Apply 1 patch topically as needed (Energy and Nutrition). Nutrition patches, Disp: , Rfl:    pantoprazole (PROTONIX) 40 MG tablet, TAKE 1 TABLET BY MOUTH EVERY DAY, Disp: 30 tablet, Rfl: 9   rosuvastatin (CRESTOR) 40 MG tablet, TAKE 1 TABLET BY MOUTH EVERY DAY, Disp: 90 tablet, Rfl: 1   Syringe/Needle, Disp, (SYRINGE 3CC/25GX1") 25G X 1" 3 ML MISC, 1 application by Does not apply route once a week., Disp: 50 each, Rfl: 5   zolpidem (AMBIEN) 10 MG tablet, TAKE 1 TABLET (10 MG TOTAL) BY MOUTH AT BEDTIME AS NEEDED. FOR SLEEP, Disp: 90 tablet, Rfl: 1  EXAM:  VITALS per patient if applicable:  GENERAL: alert, oriented, appears well and in no acute distress  HEENT: atraumatic, conjunttiva clear, no obvious abnormalities on inspection of external nose and ears  NECK: normal movements of the head and neck  LUNGS: on inspection no signs of respiratory distress, breathing rate appears normal, no obvious gross SOB, gasping or  wheezing  CV: no obvious cyanosis  MS: moves all visible extremities without noticeable abnormality  PSYCH/NEURO: pleasant and cooperative, no obvious depression or anxiety, speech and thought processing grossly intact  ASSESSMENT AND PLAN: Pain management. Indication for chronic opioid: phantom limb pain Medication and dose: Norco 10-325 # pills per month: 120 Last UDS date: 12-18-22 Opioid Treatment Agreement signed (Y/N): 12-10-18 Opioid Treatment Agreement last reviewed with patient:  10-17-23 [facility] reviewed this encounter (include red flags): Yes We refilled the Norco. We will increase the Gabapentin to 300 mg TID. I also suggested he try Voltaren gel for his hands.Gershon Crane, MD  Discussed the following assessment and plan:  No diagnosis found.     I discussed the assessment and treatment plan with the patient. The patient was provided an opportunity to ask questions and all were answered. The patient agreed with the plan and demonstrated an understanding of the instructions.   The patient was advised to call back or seek an in-person evaluation if the symptoms worsen or if the condition fails to improve as anticipated.      Review of Systems     Objective:   Physical Exam        Assessment & Plan:

## 2023-11-14 ENCOUNTER — Other Ambulatory Visit: Payer: Self-pay | Admitting: Family Medicine

## 2023-11-14 DIAGNOSIS — E7800 Pure hypercholesterolemia, unspecified: Secondary | ICD-10-CM

## 2023-11-14 DIAGNOSIS — I10 Essential (primary) hypertension: Secondary | ICD-10-CM

## 2023-11-16 ENCOUNTER — Other Ambulatory Visit: Payer: Self-pay | Admitting: Family Medicine

## 2023-11-19 NOTE — Telephone Encounter (Signed)
 Pt LOV was on 10/17/23 Last refill was done on 02/26/23 Please advise

## 2023-12-09 ENCOUNTER — Telehealth: Payer: Self-pay | Admitting: Cardiology

## 2023-12-09 ENCOUNTER — Telehealth: Payer: Self-pay | Admitting: Family Medicine

## 2023-12-09 NOTE — Telephone Encounter (Signed)
   Pt c/o of Chest Pain: STAT if active CP, including tightness, pressure, jaw pain, radiating pain to shoulder/upper arm/back, CP unrelieved by Nitro. Symptoms reported of SOB, nausea, vomiting, sweating.  1. Are you having CP right now? No  (left arm pain (numbness/tingling nothing currently)    2. Are you experiencing any other symptoms (ex. SOB, nausea, vomiting, sweating)? No    3. Is your CP continuous or coming and going? Coming and going    4. Have you taken Nitroglycerin? No    5. How long have you been experiencing CP? Few weeks     6. If NO CP at time of call then end call with telling Pt to call back or call 911 if Chest pain returns prior to return call from triage team.

## 2023-12-09 NOTE — Telephone Encounter (Signed)
 Left message for pt to call.

## 2023-12-09 NOTE — Telephone Encounter (Signed)
 Pt sch pain management at 130 pm  and cpe 2 pm  on 12-20-2023 and pt would like to combine cpe with pain management appt and coded as physical with no modification added to coding and only be charge with physical is this ok.

## 2023-12-09 NOTE — Telephone Encounter (Signed)
 Pt calling back

## 2023-12-09 NOTE — Telephone Encounter (Signed)
 Patient identification verified by 2 forms. Robert Lucky, RN    Called and spoke to patient  Patient states:   -he is having discomfort in left arm   -arm has some tingling, discomfort radiates down   -similar to symptoms he dad prior to Cath with stent placement in October   -symptoms on going for 2 weeks, not worsening   -someday he does not have sensation at all   -3/10 (DOES NOT HURT, JUST A NUISANCE)   -has not taken NTG   -130/70  -2 hours from [place]   -nearest hospital that can do cardiac is in [facility]  Patient denies:   -discomfort in shoulder blade, back, jaw  Informed patient RN will return call after speaking to DOD  Patient verbalized understanding, no questions at this time

## 2023-12-09 NOTE — Telephone Encounter (Signed)
 Patient identification verified by 2 forms. Hilton Lucky, RN    Called and spoke to patient Informed patient:    -Per DOD Dr. Chancy Comber, needs DOD OV  Patient scheduled for 4/17 at 3:20pm  Reviewed NTG use with patient  Reviewed ED warning signs/precautions  Patient verbalized understanding, no questions at this time

## 2023-12-12 ENCOUNTER — Encounter: Payer: Self-pay | Admitting: Cardiology

## 2023-12-12 ENCOUNTER — Ambulatory Visit: Payer: Self-pay | Attending: Cardiology | Admitting: Cardiology

## 2023-12-12 VITALS — BP 122/70 | HR 63 | Ht 72.0 in | Wt 191.6 lb

## 2023-12-12 DIAGNOSIS — I2510 Atherosclerotic heart disease of native coronary artery without angina pectoris: Secondary | ICD-10-CM | POA: Diagnosis not present

## 2023-12-12 DIAGNOSIS — Z794 Long term (current) use of insulin: Secondary | ICD-10-CM

## 2023-12-12 DIAGNOSIS — E785 Hyperlipidemia, unspecified: Secondary | ICD-10-CM | POA: Diagnosis not present

## 2023-12-12 DIAGNOSIS — Z01812 Encounter for preprocedural laboratory examination: Secondary | ICD-10-CM

## 2023-12-12 DIAGNOSIS — I10 Essential (primary) hypertension: Secondary | ICD-10-CM

## 2023-12-12 DIAGNOSIS — E119 Type 2 diabetes mellitus without complications: Secondary | ICD-10-CM

## 2023-12-12 MED ORDER — ISOSORBIDE MONONITRATE ER 30 MG PO TB24: 30.0000 mg | ORAL_TABLET | Freq: Every day | ORAL | 3 refills | Status: DC

## 2023-12-12 NOTE — Progress Notes (Signed)
 Cardiology Office Note:    Date:  12/12/2023   ID:  [name], DOB March 31, 1961, MRN [id number]  PCP:  Donley Furth, MD  Cardiologist:  Alexandria Angel, MD  Electrophysiologist:  None   Referring MD: Donley Furth, MD   " I am having worsening chest pain"   History of Present Illness:    Robert Huang is a 63 y.o. male with a hx of coronary artery disease with his first set of PCI's done 15 years ago he tells me, and then in 2024 he underwent a left heart catheterization which revealed stents in the mid left circumflex and proximal to distal RCA's.  He did have severe mid RCA stenosis between prior stents, moderate left main CAD and at that time PCI to the RCA, diabetes mellitus type 2, hyperlipidemia and hypertension.  Since his procedure he followed in November 2024 for but today tells me that over the last several weeks he has had worsening chest discomfort and this they are concerning because it is similar to the pain that he had during the time of his event in October 2024 but he feels the symptoms getting worse every day.  He described this as  a numb sensation, originates in the mid-chest and radiates up to the shoulder and down the arm. The patient reports that the pain is constant and similar to the symptoms experienced prior to his heart catheterization in October as noted this is worse in intensity compared to the prior symptom but he characteristics is the same.  In addition to the chest pain, the patient reports a significant loss of appetite, leading to weight loss. He describes feeling full after only a few bites of food and expresses that the thought of food makes him feel ill. Despite this, he is managing his diabetes with medication and regular blood sugar checks, with readings rarely exceeding 150.  The patient also reports a lack of physical activity due to living alone and being an amputee. He expresses a desire to exercise more with the onset of warmer  weather but is concerned about his current cardiac symptoms.    Past Medical History:  Diagnosis Date   Anxiety    CAD (coronary artery disease)    sees Dr. Audery Blazing, normal Stress test 07-16-12    Diabetes mellitus    DJD (degenerative joint disease)    Gout    Hyperlipidemia    Hypertension    Hypothyroid    Osteoarthritis    Renal calculus    hx   Umbilical hernia     Past Surgical History:  Procedure Laterality Date   AMPUTATION  07/17/2012   Procedure: AMPUTATION RAY;  Surgeon: Amada Backer, MD;  Location: [facility] OR;  Service: Orthopedics;  Laterality: Right;  right hallux amputation (1st ray resection )   AMPUTATION Left 03/06/2019   Procedure: Ray Amputation left foot;  Surgeon: Janeth Medicus, MD;  Location: [facility] ORS;  Service: Orthopedics;  Laterality: Left;   AMPUTATION Left 10/27/2021   Procedure: LEFT BELOW KNEE AMPUTATION;  Surgeon: Timothy Ford, MD;  Location: [facility] OR;  Service: Orthopedics;  Laterality: Left;   CORONARY ANGIOPLASTY WITH STENT PLACEMENT     CORONARY LITHOTRIPSY N/A 05/28/2023   Procedure: CORONARY LITHOTRIPSY;  Surgeon: Swaziland, Peter M, MD;  Location: [facility] INVASIVE CV LAB;  Service: Cardiovascular;  Laterality: N/A;   CORONARY STENT INTERVENTION N/A 05/28/2023   Procedure: CORONARY STENT INTERVENTION;  Surgeon: Swaziland, Peter M, MD;  Location: [facility] INVASIVE  CV LAB;  Service: Cardiovascular;  Laterality: N/A;   FINGER SURGERY     left index finger   I & D EXTREMITY  05/19/2012   Procedure: IRRIGATION AND DEBRIDEMENT EXTREMITY;  Surgeon: Glo Larch, MD;  Location: [facility] OR;  Service: Orthopedics;  Laterality: Right;  Right Great toe   I & D EXTREMITY Left 10/25/2021   Procedure: LEFT FOOT DEDRIDEMENT;  Surgeon: Timothy Ford, MD;  Location: [facility] OR;  Service: Orthopedics;  Laterality: Left;   I & D Toe  05/19/2012   rt great toe   LEFT HEART CATH AND CORONARY ANGIOGRAPHY N/A 05/28/2023   Procedure: LEFT HEART CATH AND CORONARY ANGIOGRAPHY;  Surgeon: Swaziland,  Peter M, MD;  Location: [facility] INVASIVE CV LAB;  Service: Cardiovascular;  Laterality: N/A;   TONSILLECTOMY     TRANSMETATARSAL AMPUTATION Left 12/10/2020   Procedure: TRANSMETATARSAL AMPUTATION LEFT;  Surgeon: Timothy Ford, MD;  Location: [facility] OR;  Service: Orthopedics;  Laterality: Left;    Current Medications: Current Meds  Medication Sig   ALPRAZolam (XANAX) 1 MG tablet Take 1 tablet (1 mg total) by mouth 3 (three) times daily as needed for anxiety.   amLODipine (NORVASC) 10 MG tablet TAKE 1 TABLET BY MOUTH EVERY DAY   aspirin 81 MG chewable tablet Chew 1 tablet (81 mg total) by mouth daily.   cloNIDine (CATAPRES) 0.3 MG tablet TAKE 1 TABLET BY MOUTH TWICE A DAY   clopidogrel (PLAVIX) 75 MG tablet TAKE 1 TABLET BY MOUTH EVERY DAY   colchicine 0.6 MG tablet Take 1 tablet (0.6 mg total) by mouth every 6 (six) hours as needed (gout).   cyanocobalamin (VITAMIN B12) 1000 MCG/ML injection INJECT 1 ML INTO THE MUSCLE ONCE A WEEK   ezetimibe (ZETIA) 10 MG tablet TAKE 1 TABLET BY MOUTH EVERY DAY   fenofibrate 160 MG tablet TAKE 1 TABLET BY MOUTH EVERY DAY   fluticasone (FLONASE) 50 MCG/ACT nasal spray Place 2 sprays into both nostrils daily.   glipiZIDE (GLUCOTROL) 5 MG tablet TAKE 1 TABLET BY MOUTH TWICE A DAY (Patient taking differently: Take 5 mg by mouth daily as needed (If blood gluose is over 200/ take a bite of pill).)   hydrALAZINE (APRESOLINE) 10 MG tablet Take 1 tablet (10 mg total) by mouth in the morning and at bedtime.   HYDROcodone-acetaminophen (NORCO) 10-325 MG tablet Take 1 tablet by mouth every 6 (six) hours as needed for moderate pain (pain score 4-6).   HYDROcodone-acetaminophen (NORCO) 10-325 MG tablet Take 1 tablet by mouth every 6 (six) hours as needed for moderate pain (pain score 4-6).   HYDROcodone-acetaminophen (NORCO) 10-325 MG tablet Take 1 tablet by mouth every 6 (six) hours as needed for moderate pain (pain score 4-6).   indomethacin (INDOCIN SR) 75 MG CR capsule TAKE 1  CAPSULE BY MOUTH TWICE A DAY WITH A MEAL (Patient taking differently: Take 75 mg by mouth daily as needed (Gout/ arthritis).)   isosorbide mononitrate (IMDUR) 30 MG 24 hr tablet Take 1 tablet (30 mg total) by mouth daily.   levothyroxine (SYNTHROID) 112 MCG tablet TAKE 1 TABLET BY MOUTH EVERY DAY   lisinopril-hydrochlorothiazide (ZESTORETIC) 20-12.5 MG tablet TAKE 1 TABLET TWICE TIMES DAILY AT 10AM AND 5PM   metFORMIN (GLUCOPHAGE) 500 MG tablet Take 2 tablets (1,000 mg total) by mouth 2 (two) times daily with a meal.   mupirocin ointment (BACTROBAN) 2 % Apply 1 Application topically 2 (two) times daily. (Patient taking differently: Apply 1 Application topically daily as  needed (Broken skin).)   nitroGLYCERIN (NITROSTAT) 0.4 MG SL tablet Place 1 tablet (0.4 mg total) under the tongue every 5 (five) minutes as needed. For chest pain.   ONETOUCH ULTRA test strip 1 EACH BY OTHER ROUTE DAILY. USE AS INSTRUCTED   OVER THE COUNTER MEDICATION Apply 1 patch topically as needed (Energy and Nutrition). Nutrition patches   pantoprazole (PROTONIX) 40 MG tablet TAKE 1 TABLET BY MOUTH EVERY DAY   rosuvastatin (CRESTOR) 40 MG tablet TAKE 1 TABLET BY MOUTH EVERY DAY   Syringe/Needle, Disp, (SYRINGE 3CC/25GX1") 25G X 1" 3 ML MISC 1 application by Does not apply route once a week.   zolpidem (AMBIEN) 10 MG tablet TAKE 1 TABLET (10 MG TOTAL) BY MOUTH AT BEDTIME AS NEEDED. FOR SLEEP     Allergies:   Patient has no known allergies.   Social History   Socioeconomic History   Marital status: Married    Spouse name: Not on file   Number of children: Not on file   Years of education: Not on file   Highest education level: Bachelor's degree (e.g., BA, AB, BS)  Occupational History   Not on file  Tobacco Use   Smoking status: Never   Smokeless tobacco: Current    Types: Chew   Tobacco comments:    Occ    Patient chew occasionally   Vaping Use   Vaping status: Never Used  Substance and Sexual Activity    Alcohol use: Yes    Alcohol/week: 0.0 standard drinks of alcohol    Comment: weekends   Drug use: No   Sexual activity: Yes  Other Topics Concern   Not on file  Social History Narrative   Not on file   Social Drivers of Health   Financial Resource Strain: Medium Risk (03/11/2023)   Overall Financial Resource Strain (CARDIA)    Difficulty of Paying Living Expenses: Somewhat hard  Food Insecurity: Food Insecurity Present (03/11/2023)   Hunger Vital Sign    Worried About Running Out of Food in the Last Year: Sometimes true    Ran Out of Food in the Last Year: Never true  Transportation Needs: No Transportation Needs (03/11/2023)   PRAPARE - Administrator, Civil Service (Medical): No    Lack of Transportation (Non-Medical): No  Physical Activity: Unknown (03/11/2023)   Exercise Vital Sign    Days of Exercise per Week: Patient declined    Minutes of Exercise per Session: Not on file  Stress: Stress Concern Present (03/11/2023)   [employer] of Occupational Health - Occupational Stress Questionnaire    Feeling of Stress : Very much  Social Connections: Unknown (03/11/2023)   Social Connection and Isolation Panel [NHANES]    Frequency of Communication with Friends and Family: Twice a week    Frequency of Social Gatherings with Friends and Family: Patient declined    Attends Religious Services: Patient declined    Database administrator or Organizations: Patient declined    Attends Engineer, structural: Not on file    Marital Status: Married     Family History: The patient's family history includes Arthritis in an other family member; Coronary artery disease in an other family member; Diabetes in an other family member; Heart attack in his mother; Hypertension in an other family member; Prostate cancer in an other family member; Stroke in an other family member.  ROS:   Review of Systems  Constitution: Negative for decreased appetite, fever and weight gain.  HENT: Negative for congestion, ear discharge, hoarse voice and sore throat.   Eyes: Negative for discharge, redness, vision loss in right eye and visual halos.  Cardiovascular: Negative for chest pain, dyspnea on exertion, leg swelling, orthopnea and palpitations.  Respiratory: Negative for cough, hemoptysis, shortness of breath and snoring.   Endocrine: Negative for heat intolerance and polyphagia.  Hematologic/Lymphatic: Negative for bleeding problem. Does not bruise/bleed easily.  Skin: Negative for flushing, nail changes, rash and suspicious lesions.  Musculoskeletal: Negative for arthritis, joint pain, muscle cramps, myalgias, neck pain and stiffness.  Gastrointestinal: Negative for abdominal pain, bowel incontinence, diarrhea and excessive appetite.  Genitourinary: Negative for decreased libido, genital sores and incomplete emptying.  Neurological: Negative for brief paralysis, focal weakness, headaches and loss of balance.  Psychiatric/Behavioral: Negative for altered mental status, depression and suicidal ideas.  Allergic/Immunologic: Negative for HIV exposure and persistent infections.    EKGs/Labs/Other Studies Reviewed:    The following studies were reviewed today:   EKG: None today   Recent Labs: 03/11/2023: ALT 20; TSH 0.18 05/21/2023: BUN 7; Creatinine, Ser 1.07; Hemoglobin 12.1; Platelets 241; Potassium 3.8; Sodium 138  Recent Lipid Panel    Component Value Date/Time   CHOL 154 03/11/2023 1554   CHOL 166 01/23/2022 1225   TRIG 297.0 (H) 03/11/2023 1554   HDL 43.20 03/11/2023 1554   HDL 47 01/23/2022 1225   CHOLHDL 4 03/11/2023 1554   VLDL 59.4 (H) 03/11/2023 1554   LDLCALC 90 01/23/2022 1225   LDLDIRECT 77.0 03/11/2023 1554    Physical Exam:    VS:  BP 122/70 (BP Location: Right Arm, Patient Position: Sitting, Cuff Size: Normal)   Pulse 63   Ht 6' (1.829 m)   Wt 191 lb 9.6 oz (86.9 kg)   SpO2 98%   BMI 25.99 kg/m     Wt Readings from Last 3 Encounters:   12/12/23 191 lb 9.6 oz (86.9 kg)  07/16/23 201 lb (91.2 kg)  07/16/23 202 lb (91.6 kg)     GEN: Well nourished, well developed in no acute distress HEENT: Normal NECK: No JVD; No carotid bruits LYMPHATICS: No lymphadenopathy CARDIAC: S1S2 noted,RRR, no murmurs, rubs, gallops RESPIRATORY:  Clear to auscultation without rales, wheezing or rhonchi  ABDOMEN: Soft, non-tender, non-distended, +bowel sounds, no guarding. EXTREMITIES: No edema, No cyanosis, no clubbing MUSCULOSKELETAL:  No deformity  SKIN: Warm and dry NEUROLOGIC:  Alert and oriented x 3, non-focal PSYCHIATRIC:  Normal affect, good insight  ASSESSMENT:    1. Pre-procedure lab exam   2. Coronary artery disease involving native coronary artery of native heart without angina pectoris   3. Insulin-requiring or dependent type II diabetes mellitus (HCC)   4. Hyperlipidemia LDL goal <55   5. Primary hypertension    PLAN:    Coronary artery disease with recurrent angina Recurrent worsening chest pain similar to prior pain that led to PCI, I am concerned with his moderate left main disease in his LAD disease that this may have progressed therefore is best to proceed with an ischemic evaluation in this patient and a cardiac catheterization would be the most appropriate at this time.  Discussed risks of waiting and benefits of early intervention with catheterization. The patient understands that risks include but are not limited to stroke (1 in 1000), death (1 in 1000), kidney failure [usually temporary] (1 in 500), bleeding (1 in 200), allergic reaction [possibly serious] (1 in 200), and agrees to proceed. - Start Imdur (isosorbide mononitrate) for antianginal therapy. - Schedule heart  catheterization early next week. - Advise hospital visit if symptoms worsen. - Coordinate with daughter for procedure support.  Type 2 Diabetes Mellitus Type 2 diabetes with A1c of 6.7%, well-controlled on medication. - Continue current diabetes  medication and monitoring. - Encourage dietary management and weight monitoring.  Decreased appetite and weight loss Significant decrease in appetite and weight loss over ten years, feeling full after minimal intake. - Continue supplements including probiotics and vitamin D. - Monitor weight and nutritional intake.  Follow-up for heart catheterization Requires follow-up for planned heart catheterization and symptom management. Discussed FMLA paperwork. - Schedule heart catheterization early next week. - Discuss FMLA paperwork for medical leave.  The patient is in agreement with the above plan. The patient left the office in stable condition.  The patient will follow up in     Medication Adjustments/Labs and Tests Ordered: Current medicines are reviewed at length with the patient today.  Concerns regarding medicines are outlined above.  Orders Placed This Encounter  Procedures   Comprehensive Metabolic Panel (CMET)   Magnesium   CBC   Meds ordered this encounter  Medications   isosorbide mononitrate (IMDUR) 30 MG 24 hr tablet    Sig: Take 1 tablet (30 mg total) by mouth daily.    Dispense:  90 tablet    Refill:  3    Patient Instructions  Medication Instructions:  Your physician has recommended you make the following change in your medication:  START: Imdur 30 mg once daily *If you need a refill on your cardiac medications before your next appointment, please call your pharmacy*  Lab Work: CMET, Mag, CBC If you have labs (blood work) drawn today and your tests are completely normal, you will receive your results only by: MyChart Message (if you have MyChart) OR A paper copy in the mail If you have any lab test that is abnormal or we need to change your treatment, we will call you to review the results.  Testing/Procedures:   [place] [facility] DEPT [facility] AT [facility] [address] [place] [address] [place]  [place] [address] Dept: [phone] Loc: [phone]  Fairley Copher [id number]  12/12/2023  You are scheduled for a Cardiac Catheterization on Monday, April 21 with Dr. Bryan Lemma.  1. Please arrive at the [facility] (Main Entrance A) at [facility]: [address] [place], [place] [address] at 5:30 AM (This time is 2 hour(s) before your procedure to ensure your preparation).   Free valet parking service is available. You will check in at ADMITTING. The support person will be asked to wait in the waiting room.  It is OK to have someone drop you off and come back when you are ready to be discharged.    Special note: Every effort is made to have your procedure done on time. Please understand that emergencies sometimes delay scheduled procedures.  2. Diet: Do not eat solid foods after midnight.  The patient may have clear liquids until 5am upon the day of the procedure.  3. Labs: You will need to have blood drawn on TODAY  4. Medication instructions in preparation for your procedure:   Contrast Allergy: No  Do not take Diabetes Med Glucophage (Metformin) on the day of the procedure and HOLD 48 HOURS AFTER THE PROCEDURE.  On the morning of your procedure, take your Aspirin 81 mg and any morning medicines NOT listed above.  You may use sips of water.  5. Plan  to go home the same day, you will only stay overnight if medically necessary. 6. Bring a current list of your medications and current insurance cards. 7. You MUST have a responsible person to drive you home. 8. Someone MUST be with you the first 24 hours after you arrive home or your discharge will be delayed. 9. Please wear clothes that are easy to get on and off and wear slip-on shoes.  Thank you for allowing [place] to care for you!   --  Invasive Cardiovascular services   Follow-Up: At [facility], you and your health needs are our priority.  As part of our continuing mission to provide you with exceptional  heart care, our providers are all part of one team.  This team includes your primary Cardiologist (physician) and Advanced Practice Providers or APPs (Physician Assistants and Nurse Practitioners) who all work together to provide you with the care you need, when you need it.  Your next appointment:   2-3 weeks with App 16 week(s)  Provider:   [name], MD     Other Instructions:   1st Floor: - Lobby - Registration  - Pharmacy  - Lab - Cafe  2nd Floor: - PV Lab - Diagnostic Testing (echo, CT, nuclear med)  3rd Floor: - Vacant  4th Floor: - TCTS (cardiothoracic surgery) - AFib Clinic - Structural Heart Clinic - Vascular Surgery  - Vascular Ultrasound  5th Floor: - HeartCare Cardiology (general and EP) - Clinical Pharmacy for coumadin, hypertension, lipid, weight-loss medications, and med management appointments    Valet parking services will be available as well.      Adopting a Healthy Lifestyle.  Know what a healthy weight is for you (roughly BMI <25) and aim to maintain this   Aim for 7+ servings of fruits and vegetables daily   65-80+ fluid ounces of water or unsweet tea for healthy kidneys   Limit to max 1 drink of alcohol per day; avoid smoking/tobacco   Limit animal fats in diet for cholesterol and heart health - choose grass fed whenever available   Avoid highly processed foods, and foods high in saturated/trans fats   Aim for low stress - take time to unwind and care for your mental health   Aim for 150 min of moderate intensity exercise weekly for heart health, and weights twice weekly for bone health   Aim for 7-9 hours of sleep daily   When it comes to diets, agreement about the perfect plan isnt easy to find, even among the experts. Experts at the [facility] of [employer] developed an idea known as the Healthy Eating Plate. Just imagine a plate divided into logical, healthy portions.   The emphasis is on diet quality:   Load  up on vegetables and fruits - one-half of your plate: Aim for color and variety, and remember that potatoes dont count.   Go for whole grains - one-quarter of your plate: Whole wheat, barley, wheat berries, quinoa, oats, brown rice, and foods made with them. If you want pasta, go with whole wheat pasta.   Protein power - one-quarter of your plate: Fish, chicken, beans, and nuts are all healthy, versatile protein sources. Limit red meat.   The diet, however, does go beyond the plate, offering a few other suggestions.   Use healthy plant oils, such as olive, canola, soy, corn, sunflower and peanut. Check the labels, and avoid partially hydrogenated oil, which have unhealthy trans fats.   If youre  thirsty, drink water. Coffee and tea are good in moderation, but skip sugary drinks and limit milk and dairy products to one or two daily servings.   The type of carbohydrate in the diet is more important than the amount. Some sources of carbohydrates, such as vegetables, fruits, whole grains, and beans-are healthier than others.   Finally, stay active  Signed, Sabrine Patchen, DO  12/12/2023 5:00 PM    [facility]

## 2023-12-12 NOTE — H&P (View-Only) (Signed)
 Cardiology Office Note:    Date:  12/12/2023   ID:  Robert Huang, DOB March 31, 1961, MRN [id number]  PCP:  Donley Furth, MD  Cardiologist:  Alexandria Angel, MD  Electrophysiologist:  None   Referring MD: Donley Furth, MD   " I am having worsening chest pain"   History of Present Illness:    Robert Huang is a 63 y.o. male with a hx of coronary artery disease with his first set of PCI's done 15 years ago he tells me, and then in 2024 he underwent a left heart catheterization which revealed stents in the mid left circumflex and proximal to distal RCA's.  He did have severe mid RCA stenosis between prior stents, moderate left main CAD and at that time PCI to the RCA, diabetes mellitus type 2, hyperlipidemia and hypertension.  Since his procedure he followed in November 2024 for but today tells me that over the last several weeks he has had worsening chest discomfort and this they are concerning because it is similar to the pain that he had during the time of his event in October 2024 but he feels the symptoms getting worse every day.  He described this as  a numb sensation, originates in the mid-chest and radiates up to the shoulder and down the arm. The patient reports that the pain is constant and similar to the symptoms experienced prior to his heart catheterization in October as noted this is worse in intensity compared to the prior symptom but he characteristics is the same.  In addition to the chest pain, the patient reports a significant loss of appetite, leading to weight loss. He describes feeling full after only a few bites of food and expresses that the thought of food makes him feel ill. Despite this, he is managing his diabetes with medication and regular blood sugar checks, with readings rarely exceeding 150.  The patient also reports a lack of physical activity due to living alone and being an amputee. He expresses a desire to exercise more with the onset of warmer  weather but is concerned about his current cardiac symptoms.    Past Medical History:  Diagnosis Date   Anxiety    CAD (coronary artery disease)    sees Dr. Audery Blazing, normal Stress test 07-16-12    Diabetes mellitus    DJD (degenerative joint disease)    Gout    Hyperlipidemia    Hypertension    Hypothyroid    Osteoarthritis    Renal calculus    hx   Umbilical hernia     Past Surgical History:  Procedure Laterality Date   AMPUTATION  07/17/2012   Procedure: AMPUTATION RAY;  Surgeon: Amada Backer, MD;  Location: [facility] OR;  Service: Orthopedics;  Laterality: Right;  right hallux amputation (1st ray resection )   AMPUTATION Left 03/06/2019   Procedure: Ray Amputation left foot;  Surgeon: Janeth Medicus, MD;  Location: [facility] ORS;  Service: Orthopedics;  Laterality: Left;   AMPUTATION Left 10/27/2021   Procedure: LEFT BELOW KNEE AMPUTATION;  Surgeon: Timothy Ford, MD;  Location: [facility] OR;  Service: Orthopedics;  Laterality: Left;   CORONARY ANGIOPLASTY WITH STENT PLACEMENT     CORONARY LITHOTRIPSY N/A 05/28/2023   Procedure: CORONARY LITHOTRIPSY;  Surgeon: Swaziland, Peter M, MD;  Location: [facility] INVASIVE CV LAB;  Service: Cardiovascular;  Laterality: N/A;   CORONARY STENT INTERVENTION N/A 05/28/2023   Procedure: CORONARY STENT INTERVENTION;  Surgeon: Swaziland, Peter M, MD;  Location: [facility] INVASIVE  CV LAB;  Service: Cardiovascular;  Laterality: N/A;   FINGER SURGERY     left index finger   I & D EXTREMITY  05/19/2012   Procedure: IRRIGATION AND DEBRIDEMENT EXTREMITY;  Surgeon: Glo Larch, MD;  Location: [facility] OR;  Service: Orthopedics;  Laterality: Right;  Right Great toe   I & D EXTREMITY Left 10/25/2021   Procedure: LEFT FOOT DEDRIDEMENT;  Surgeon: Timothy Ford, MD;  Location: [facility] OR;  Service: Orthopedics;  Laterality: Left;   I & D Toe  05/19/2012   rt great toe   LEFT HEART CATH AND CORONARY ANGIOGRAPHY N/A 05/28/2023   Procedure: LEFT HEART CATH AND CORONARY ANGIOGRAPHY;  Surgeon: Swaziland,  Peter M, MD;  Location: [facility] INVASIVE CV LAB;  Service: Cardiovascular;  Laterality: N/A;   TONSILLECTOMY     TRANSMETATARSAL AMPUTATION Left 12/10/2020   Procedure: TRANSMETATARSAL AMPUTATION LEFT;  Surgeon: Timothy Ford, MD;  Location: [facility] OR;  Service: Orthopedics;  Laterality: Left;    Current Medications: Current Meds  Medication Sig   ALPRAZolam (XANAX) 1 MG tablet Take 1 tablet (1 mg total) by mouth 3 (three) times daily as needed for anxiety.   amLODipine (NORVASC) 10 MG tablet TAKE 1 TABLET BY MOUTH EVERY DAY   aspirin 81 MG chewable tablet Chew 1 tablet (81 mg total) by mouth daily.   cloNIDine (CATAPRES) 0.3 MG tablet TAKE 1 TABLET BY MOUTH TWICE A DAY   clopidogrel (PLAVIX) 75 MG tablet TAKE 1 TABLET BY MOUTH EVERY DAY   colchicine 0.6 MG tablet Take 1 tablet (0.6 mg total) by mouth every 6 (six) hours as needed (gout).   cyanocobalamin (VITAMIN B12) 1000 MCG/ML injection INJECT 1 ML INTO THE MUSCLE ONCE A WEEK   ezetimibe (ZETIA) 10 MG tablet TAKE 1 TABLET BY MOUTH EVERY DAY   fenofibrate 160 MG tablet TAKE 1 TABLET BY MOUTH EVERY DAY   fluticasone (FLONASE) 50 MCG/ACT nasal spray Place 2 sprays into both nostrils daily.   glipiZIDE (GLUCOTROL) 5 MG tablet TAKE 1 TABLET BY MOUTH TWICE A DAY (Patient taking differently: Take 5 mg by mouth daily as needed (If blood gluose is over 200/ take a bite of pill).)   hydrALAZINE (APRESOLINE) 10 MG tablet Take 1 tablet (10 mg total) by mouth in the morning and at bedtime.   HYDROcodone-acetaminophen (NORCO) 10-325 MG tablet Take 1 tablet by mouth every 6 (six) hours as needed for moderate pain (pain score 4-6).   HYDROcodone-acetaminophen (NORCO) 10-325 MG tablet Take 1 tablet by mouth every 6 (six) hours as needed for moderate pain (pain score 4-6).   HYDROcodone-acetaminophen (NORCO) 10-325 MG tablet Take 1 tablet by mouth every 6 (six) hours as needed for moderate pain (pain score 4-6).   indomethacin (INDOCIN SR) 75 MG CR capsule TAKE 1  CAPSULE BY MOUTH TWICE A DAY WITH A MEAL (Patient taking differently: Take 75 mg by mouth daily as needed (Gout/ arthritis).)   isosorbide mononitrate (IMDUR) 30 MG 24 hr tablet Take 1 tablet (30 mg total) by mouth daily.   levothyroxine (SYNTHROID) 112 MCG tablet TAKE 1 TABLET BY MOUTH EVERY DAY   lisinopril-hydrochlorothiazide (ZESTORETIC) 20-12.5 MG tablet TAKE 1 TABLET TWICE TIMES DAILY AT 10AM AND 5PM   metFORMIN (GLUCOPHAGE) 500 MG tablet Take 2 tablets (1,000 mg total) by mouth 2 (two) times daily with a meal.   mupirocin ointment (BACTROBAN) 2 % Apply 1 Application topically 2 (two) times daily. (Patient taking differently: Apply 1 Application topically daily as  needed (Broken skin).)   nitroGLYCERIN (NITROSTAT) 0.4 MG SL tablet Place 1 tablet (0.4 mg total) under the tongue every 5 (five) minutes as needed. For chest pain.   ONETOUCH ULTRA test strip 1 EACH BY OTHER ROUTE DAILY. USE AS INSTRUCTED   OVER THE COUNTER MEDICATION Apply 1 patch topically as needed (Energy and Nutrition). Nutrition patches   pantoprazole (PROTONIX) 40 MG tablet TAKE 1 TABLET BY MOUTH EVERY DAY   rosuvastatin (CRESTOR) 40 MG tablet TAKE 1 TABLET BY MOUTH EVERY DAY   Syringe/Needle, Disp, (SYRINGE 3CC/25GX1") 25G X 1" 3 ML MISC 1 application by Does not apply route once a week.   zolpidem (AMBIEN) 10 MG tablet TAKE 1 TABLET (10 MG TOTAL) BY MOUTH AT BEDTIME AS NEEDED. FOR SLEEP     Allergies:   Patient has no known allergies.   Social History   Socioeconomic History   Marital status: Married    Spouse name: Not on file   Number of children: Not on file   Years of education: Not on file   Highest education level: Bachelor's degree (e.g., BA, AB, BS)  Occupational History   Not on file  Tobacco Use   Smoking status: Never   Smokeless tobacco: Current    Types: Chew   Tobacco comments:    Occ    Patient chew occasionally   Vaping Use   Vaping status: Never Used  Substance and Sexual Activity    Alcohol use: Yes    Alcohol/week: 0.0 standard drinks of alcohol    Comment: weekends   Drug use: No   Sexual activity: Yes  Other Topics Concern   Not on file  Social History Narrative   Not on file   Social Drivers of Health   Financial Resource Strain: Medium Risk (03/11/2023)   Overall Financial Resource Strain (CARDIA)    Difficulty of Paying Living Expenses: Somewhat hard  Food Insecurity: Food Insecurity Present (03/11/2023)   Hunger Vital Sign    Worried About Running Out of Food in the Last Year: Sometimes true    Ran Out of Food in the Last Year: Never true  Transportation Needs: No Transportation Needs (03/11/2023)   PRAPARE - Administrator, Civil Service (Medical): No    Lack of Transportation (Non-Medical): No  Physical Activity: Unknown (03/11/2023)   Exercise Vital Sign    Days of Exercise per Week: Patient declined    Minutes of Exercise per Session: Not on file  Stress: Stress Concern Present (03/11/2023)   [employer] of Occupational Health - Occupational Stress Questionnaire    Feeling of Stress : Very much  Social Connections: Unknown (03/11/2023)   Social Connection and Isolation Panel [NHANES]    Frequency of Communication with Friends and Family: Twice a week    Frequency of Social Gatherings with Friends and Family: Patient declined    Attends Religious Services: Patient declined    Database administrator or Organizations: Patient declined    Attends Engineer, structural: Not on file    Marital Status: Married     Family History: The patient's family history includes Arthritis in an other family member; Coronary artery disease in an other family member; Diabetes in an other family member; Heart attack in his mother; Hypertension in an other family member; Prostate cancer in an other family member; Stroke in an other family member.  ROS:   Review of Systems  Constitution: Negative for decreased appetite, fever and weight gain.  HENT: Negative for congestion, ear discharge, hoarse voice and sore throat.   Eyes: Negative for discharge, redness, vision loss in right eye and visual halos.  Cardiovascular: Negative for chest pain, dyspnea on exertion, leg swelling, orthopnea and palpitations.  Respiratory: Negative for cough, hemoptysis, shortness of breath and snoring.   Endocrine: Negative for heat intolerance and polyphagia.  Hematologic/Lymphatic: Negative for bleeding problem. Does not bruise/bleed easily.  Skin: Negative for flushing, nail changes, rash and suspicious lesions.  Musculoskeletal: Negative for arthritis, joint pain, muscle cramps, myalgias, neck pain and stiffness.  Gastrointestinal: Negative for abdominal pain, bowel incontinence, diarrhea and excessive appetite.  Genitourinary: Negative for decreased libido, genital sores and incomplete emptying.  Neurological: Negative for brief paralysis, focal weakness, headaches and loss of balance.  Psychiatric/Behavioral: Negative for altered mental status, depression and suicidal ideas.  Allergic/Immunologic: Negative for HIV exposure and persistent infections.    EKGs/Labs/Other Studies Reviewed:    The following studies were reviewed today:   EKG: None today   Recent Labs: 03/11/2023: ALT 20; TSH 0.18 05/21/2023: BUN 7; Creatinine, Ser 1.07; Hemoglobin 12.1; Platelets 241; Potassium 3.8; Sodium 138  Recent Lipid Panel    Component Value Date/Time   CHOL 154 03/11/2023 1554   CHOL 166 01/23/2022 1225   TRIG 297.0 (H) 03/11/2023 1554   HDL 43.20 03/11/2023 1554   HDL 47 01/23/2022 1225   CHOLHDL 4 03/11/2023 1554   VLDL 59.4 (H) 03/11/2023 1554   LDLCALC 90 01/23/2022 1225   LDLDIRECT 77.0 03/11/2023 1554    Physical Exam:    VS:  BP 122/70 (BP Location: Right Arm, Patient Position: Sitting, Cuff Size: Normal)   Pulse 63   Ht 6' (1.829 m)   Wt 191 lb 9.6 oz (86.9 kg)   SpO2 98%   BMI 25.99 kg/m     Wt Readings from Last 3 Encounters:   12/12/23 191 lb 9.6 oz (86.9 kg)  07/16/23 201 lb (91.2 kg)  07/16/23 202 lb (91.6 kg)     GEN: Well nourished, well developed in no acute distress HEENT: Normal NECK: No JVD; No carotid bruits LYMPHATICS: No lymphadenopathy CARDIAC: S1S2 noted,RRR, no murmurs, rubs, gallops RESPIRATORY:  Clear to auscultation without rales, wheezing or rhonchi  ABDOMEN: Soft, non-tender, non-distended, +bowel sounds, no guarding. EXTREMITIES: No edema, No cyanosis, no clubbing MUSCULOSKELETAL:  No deformity  SKIN: Warm and dry NEUROLOGIC:  Alert and oriented x 3, non-focal PSYCHIATRIC:  Normal affect, good insight  ASSESSMENT:    1. Pre-procedure lab exam   2. Coronary artery disease involving native coronary artery of native heart without angina pectoris   3. Insulin-requiring or dependent type II diabetes mellitus (HCC)   4. Hyperlipidemia LDL goal <55   5. Primary hypertension    PLAN:    Coronary artery disease with recurrent angina Recurrent worsening chest pain similar to prior pain that led to PCI, I am concerned with his moderate left main disease in his LAD disease that this may have progressed therefore is best to proceed with an ischemic evaluation in this patient and a cardiac catheterization would be the most appropriate at this time.  Discussed risks of waiting and benefits of early intervention with catheterization. The patient understands that risks include but are not limited to stroke (1 in 1000), death (1 in 1000), kidney failure [usually temporary] (1 in 500), bleeding (1 in 200), allergic reaction [possibly serious] (1 in 200), and agrees to proceed. - Start Imdur (isosorbide mononitrate) for antianginal therapy. - Schedule heart  catheterization early next week. - Advise hospital visit if symptoms worsen. - Coordinate with daughter for procedure support.  Type 2 Diabetes Mellitus Type 2 diabetes with A1c of 6.7%, well-controlled on medication. - Continue current diabetes  medication and monitoring. - Encourage dietary management and weight monitoring.  Decreased appetite and weight loss Significant decrease in appetite and weight loss over ten years, feeling full after minimal intake. - Continue supplements including probiotics and vitamin D. - Monitor weight and nutritional intake.  Follow-up for heart catheterization Requires follow-up for planned heart catheterization and symptom management. Discussed FMLA paperwork. - Schedule heart catheterization early next week. - Discuss FMLA paperwork for medical leave.  The patient is in agreement with the above plan. The patient left the office in stable condition.  The patient will follow up in     Medication Adjustments/Labs and Tests Ordered: Current medicines are reviewed at length with the patient today.  Concerns regarding medicines are outlined above.  Orders Placed This Encounter  Procedures   Comprehensive Metabolic Panel (CMET)   Magnesium   CBC   Meds ordered this encounter  Medications   isosorbide mononitrate (IMDUR) 30 MG 24 hr tablet    Sig: Take 1 tablet (30 mg total) by mouth daily.    Dispense:  90 tablet    Refill:  3    Patient Instructions  Medication Instructions:  Your physician has recommended you make the following change in your medication:  START: Imdur 30 mg once daily *If you need a refill on your cardiac medications before your next appointment, please call your pharmacy*  Lab Work: CMET, Mag, CBC If you have labs (blood work) drawn today and your tests are completely normal, you will receive your results only by: MyChart Message (if you have MyChart) OR A paper copy in the mail If you have any lab test that is abnormal or we need to change your treatment, we will call you to review the results.  Testing/Procedures:   [place] [facility] DEPT OF [facility] AT [facility] [address] [place] [address] [place]  [place] [address] Dept: [phone] Loc: [phone]  Fairley Copher [id number]  12/12/2023  You are scheduled for a Cardiac Catheterization on Monday, April 21 with Dr. Bryan Lemma.  1. Please arrive at the [facility] (Main Entrance A) at [facility]: [address] [place], [place] [address] at 5:30 AM (This time is 2 hour(s) before your procedure to ensure your preparation).   Free valet parking service is available. You will check in at ADMITTING. The support person will be asked to wait in the waiting room.  It is OK to have someone drop you off and come back when you are ready to be discharged.    Special note: Every effort is made to have your procedure done on time. Please understand that emergencies sometimes delay scheduled procedures.  2. Diet: Do not eat solid foods after midnight.  The patient may have clear liquids until 5am upon the day of the procedure.  3. Labs: You will need to have blood drawn on TODAY  4. Medication instructions in preparation for your procedure:   Contrast Allergy: No  Do not take Diabetes Med Glucophage (Metformin) on the day of the procedure and HOLD 48 HOURS AFTER THE PROCEDURE.  On the morning of your procedure, take your Aspirin 81 mg and any morning medicines NOT listed above.  You may use sips of water.  5. Plan  to go home the same day, you will only stay overnight if medically necessary. 6. Bring a current list of your medications and current insurance cards. 7. You MUST have a responsible person to drive you home. 8. Someone MUST be with you the first 24 hours after you arrive home or your discharge will be delayed. 9. Please wear clothes that are easy to get on and off and wear slip-on shoes.  Thank you for allowing [place] to care for you!   --  Invasive Cardiovascular services   Follow-Up: At [facility], you and your health needs are our priority.  As part of our continuing mission to provide you with exceptional  heart care, our providers are all part of one team.  This team includes your primary Cardiologist (physician) and Advanced Practice Providers or APPs (Physician Assistants and Nurse Practitioners) who all work together to provide you with the care you need, when you need it.  Your next appointment:   2-3 weeks with App 16 week(s)  Provider:   [name], MD     Other Instructions:   1st Floor: - Lobby - Registration  - Pharmacy  - Lab - Cafe  2nd Floor: - PV Lab - Diagnostic Testing (echo, CT, nuclear med)  3rd Floor: - Vacant  4th Floor: - TCTS (cardiothoracic surgery) - AFib Clinic - Structural Heart Clinic - Vascular Surgery  - Vascular Ultrasound  5th Floor: - HeartCare Cardiology (general and EP) - Clinical Pharmacy for coumadin, hypertension, lipid, weight-loss medications, and med management appointments    Valet parking services will be available as well.      Adopting a Healthy Lifestyle.  Know what a healthy weight is for you (roughly BMI <25) and aim to maintain this   Aim for 7+ servings of fruits and vegetables daily   65-80+ fluid ounces of water or unsweet tea for healthy kidneys   Limit to max 1 drink of alcohol per day; avoid smoking/tobacco   Limit animal fats in diet for cholesterol and heart health - choose grass fed whenever available   Avoid highly processed foods, and foods high in saturated/trans fats   Aim for low stress - take time to unwind and care for your mental health   Aim for 150 min of moderate intensity exercise weekly for heart health, and weights twice weekly for bone health   Aim for 7-9 hours of sleep daily   When it comes to diets, agreement about the perfect plan isnt easy to find, even among the experts. Experts at the [facility] of [employer] developed an idea known as the Healthy Eating Plate. Just imagine a plate divided into logical, healthy portions.   The emphasis is on diet quality:   Load  up on vegetables and fruits - one-half of your plate: Aim for color and variety, and remember that potatoes dont count.   Go for whole grains - one-quarter of your plate: Whole wheat, barley, wheat berries, quinoa, oats, brown rice, and foods made with them. If you want pasta, go with whole wheat pasta.   Protein power - one-quarter of your plate: Fish, chicken, beans, and nuts are all healthy, versatile protein sources. Limit red meat.   The diet, however, does go beyond the plate, offering a few other suggestions.   Use healthy plant oils, such as olive, canola, soy, corn, sunflower and peanut. Check the labels, and avoid partially hydrogenated oil, which have unhealthy trans fats.   If youre  thirsty, drink water. Coffee and tea are good in moderation, but skip sugary drinks and limit milk and dairy products to one or two daily servings.   The type of carbohydrate in the diet is more important than the amount. Some sources of carbohydrates, such as vegetables, fruits, whole grains, and beans-are healthier than others.   Finally, stay active  Signed, Sabrine Patchen, DO  12/12/2023 5:00 PM    [facility]

## 2023-12-12 NOTE — Patient Instructions (Signed)
 Medication Instructions:  Your physician has recommended you make the following change in your medication:  START: Imdur 30 mg once daily *If you need a refill on your cardiac medications before your next appointment, please call your pharmacy*  Lab Work: CMET, Mag, CBC If you have labs (blood work) drawn today and your tests are completely normal, you will receive your results only by: MyChart Message (if you have MyChart) OR A paper copy in the mail If you have any lab test that is abnormal or we need to change your treatment, we will call you to review the results.  Testing/Procedures:  [place] [facility] DEPT OF [facility] AT [facility] [address] [place] [address] [place] [address] Dept: [phone] Loc: [phone]  Robert Huang [id number]  12/12/2023  You are scheduled for a Cardiac Catheterization on Monday, April 21 with Dr. Bryan Lemma.  1. Please arrive at the [facility] (Main Entrance A) at [facility]: [address] [place], [place] [address] at 5:30 AM (This time is 2 hour(s) before your procedure to ensure your preparation).   Free valet parking service is available. You will check in at ADMITTING. The support person will be asked to wait in the waiting room.  It is OK to have someone drop you off and come back when you are ready to be discharged.    Special note: Every effort is made to have your procedure done on time. Please understand that emergencies sometimes delay scheduled procedures.  2. Diet: Do not eat solid foods after midnight.  The patient may have clear liquids until 5am upon the day of the procedure.  3. Labs: You will need to have blood drawn on TODAY  4. Medication instructions in preparation for your procedure:   Contrast Allergy: No  Do not take Diabetes Med Glucophage (Metformin) on the day of the procedure and HOLD 48 HOURS AFTER THE PROCEDURE.  On the morning of your  procedure, take your Aspirin 81 mg and any morning medicines NOT listed above.  You may use sips of water.  5. Plan to go home the same day, you will only stay overnight if medically necessary. 6. Bring a current list of your medications and current insurance cards. 7. You MUST have a responsible person to drive you home. 8. Someone MUST be with you the first 24 hours after you arrive home or your discharge will be delayed. 9. Please wear clothes that are easy to get on and off and wear slip-on shoes.  Thank you for allowing [place] to care for you!   -- [facility]   Follow-Up: At [facility], you and your health needs are our priority.  As part of our continuing mission to provide you with exceptional heart care, our providers are all part of one team.  This team includes your primary Cardiologist (physician) and Advanced Practice Providers or APPs (Physician Assistants and Nurse Practitioners) who all work together to provide you with the care you need, when you need it.  Your next appointment:   2-3 weeks with App 16 week(s)  Provider:   [name], MD     Other Instructions:   1st Floor: - Lobby - Registration  - Pharmacy  - Lab - Cafe  2nd Floor: - PV Lab - Diagnostic Testing (echo, CT, nuclear med)  3rd Floor: - Vacant  4th Floor: - TCTS (cardiothoracic surgery) - AFib Clinic - Structural  Heart Clinic - Vascular Surgery  - Vascular Ultrasound  5th Floor: - [facility] (general and EP) - Clinical Pharmacy for coumadin, hypertension, lipid, weight-loss medications, and med management appointments    Valet parking services will be available as well.

## 2023-12-13 LAB — CBC
Hematocrit: 32 % — ABNORMAL LOW (ref 37.5–51.0)
Hemoglobin: 10.9 g/dL — ABNORMAL LOW (ref 13.0–17.7)
MCH: 31.2 pg (ref 26.6–33.0)
MCHC: 34.1 g/dL (ref 31.5–35.7)
MCV: 92 fL (ref 79–97)
Platelets: 237 10*3/uL (ref 150–450)
RBC: 3.49 x10E6/uL — ABNORMAL LOW (ref 4.14–5.80)
RDW: 12.8 % (ref 11.6–15.4)
WBC: 6.3 10*3/uL (ref 3.4–10.8)

## 2023-12-13 LAB — COMPREHENSIVE METABOLIC PANEL WITH GFR
ALT: 9 IU/L (ref 0–44)
AST: 15 IU/L (ref 0–40)
Albumin: 3.4 g/dL — ABNORMAL LOW (ref 3.9–4.9)
Alkaline Phosphatase: 90 IU/L (ref 44–121)
BUN/Creatinine Ratio: 9 — ABNORMAL LOW (ref 10–24)
BUN: 11 mg/dL (ref 8–27)
Bilirubin Total: 0.2 mg/dL (ref 0.0–1.2)
CO2: 19 mmol/L — ABNORMAL LOW (ref 20–29)
Calcium: 8.9 mg/dL (ref 8.6–10.2)
Chloride: 102 mmol/L (ref 96–106)
Creatinine, Ser: 1.2 mg/dL (ref 0.76–1.27)
Globulin, Total: 2.6 g/dL (ref 1.5–4.5)
Glucose: 127 mg/dL — ABNORMAL HIGH (ref 70–99)
Potassium: 3.6 mmol/L (ref 3.5–5.2)
Sodium: 135 mmol/L (ref 134–144)
Total Protein: 6 g/dL (ref 6.0–8.5)
eGFR: 68 mL/min/{1.73_m2} (ref >59)

## 2023-12-13 LAB — MAGNESIUM: Magnesium: 1.4 mg/dL — ABNORMAL LOW (ref 1.6–2.3)

## 2023-12-15 ENCOUNTER — Encounter: Payer: Self-pay | Admitting: Cardiology

## 2023-12-16 ENCOUNTER — Ambulatory Visit (HOSPITAL_COMMUNITY)
Admission: RE | Admit: 2023-12-16 | Discharge: 2023-12-16 | Disposition: A | Discharge status: Home / Self Care | Attending: Cardiology | Admitting: Cardiology

## 2023-12-16 ENCOUNTER — Other Ambulatory Visit: Payer: Self-pay

## 2023-12-16 ENCOUNTER — Encounter (HOSPITAL_COMMUNITY)
Admission: RE | Disposition: A | Discharge status: Home / Self Care | Payer: Self-pay | Source: Home / Self Care | Attending: Cardiology

## 2023-12-16 ENCOUNTER — Encounter (HOSPITAL_COMMUNITY): Payer: Self-pay | Admitting: Cardiology

## 2023-12-16 DIAGNOSIS — I10 Essential (primary) hypertension: Secondary | ICD-10-CM | POA: Insufficient documentation

## 2023-12-16 DIAGNOSIS — I25118 Atherosclerotic heart disease of native coronary artery with other forms of angina pectoris: Secondary | ICD-10-CM | POA: Insufficient documentation

## 2023-12-16 DIAGNOSIS — F17220 Nicotine dependence, chewing tobacco, uncomplicated: Secondary | ICD-10-CM | POA: Diagnosis not present

## 2023-12-16 DIAGNOSIS — Z794 Long term (current) use of insulin: Secondary | ICD-10-CM | POA: Insufficient documentation

## 2023-12-16 DIAGNOSIS — E119 Type 2 diabetes mellitus without complications: Secondary | ICD-10-CM | POA: Insufficient documentation

## 2023-12-16 DIAGNOSIS — E785 Hyperlipidemia, unspecified: Secondary | ICD-10-CM | POA: Diagnosis not present

## 2023-12-16 DIAGNOSIS — Z8249 Family history of ischemic heart disease and other diseases of the circulatory system: Secondary | ICD-10-CM | POA: Diagnosis not present

## 2023-12-16 HISTORY — PX: CORONARY ULTRASOUND/IVUS: CATH118244

## 2023-12-16 HISTORY — PX: LEFT HEART CATH AND CORONARY ANGIOGRAPHY: CATH118249

## 2023-12-16 LAB — GLUCOSE, CAPILLARY
Glucose-Capillary: 244 mg/dL — ABNORMAL HIGH (ref 70–99)
Glucose-Capillary: 170 mg/dL — ABNORMAL HIGH (ref 70–99)

## 2023-12-16 LAB — POCT ACTIVATED CLOTTING TIME
Activated Clotting Time: 199 s
Activated Clotting Time: 239 s
Activated Clotting Time: 256 s

## 2023-12-16 SURGERY — LEFT HEART CATH AND CORONARY ANGIOGRAPHY
Anesthesia: LOCAL

## 2023-12-16 MED ORDER — HEPARIN SODIUM (PORCINE) 1000 UNIT/ML IJ SOLN
INTRAMUSCULAR | Status: DC | PRN
  Administered 2023-12-16: 4500 [IU] via INTRAVENOUS
  Administered 2023-12-16: 4000 [IU] via INTRAVENOUS
  Administered 2023-12-16: 2000 [IU] via INTRAVENOUS
  Administered 2023-12-16: 4500 [IU] via INTRAVENOUS

## 2023-12-16 MED ORDER — VERAPAMIL HCL 2.5 MG/ML IV SOLN
INTRAVENOUS | Status: AC
  Filled 2023-12-16: qty 2

## 2023-12-16 MED ORDER — CLOPIDOGREL BISULFATE 75 MG PO TABS: 75.0000 mg | ORAL_TABLET | ORAL | Status: AC

## 2023-12-16 MED ORDER — HEPARIN (PORCINE) IN NACL 1000-0.9 UT/500ML-% IV SOLN
INTRAVENOUS | Status: DC | PRN
  Administered 2023-12-16 (×2): 500 mL

## 2023-12-16 MED ORDER — MIDAZOLAM HCL 2 MG/2ML IJ SOLN
INTRAMUSCULAR | Status: AC
  Filled 2023-12-16: qty 2

## 2023-12-16 MED ORDER — HEPARIN SODIUM (PORCINE) 1000 UNIT/ML IJ SOLN
INTRAMUSCULAR | Status: AC
  Filled 2023-12-16: qty 10

## 2023-12-16 MED ORDER — LIDOCAINE HCL (PF) 1 % IJ SOLN
INTRAMUSCULAR | Status: AC
  Filled 2023-12-16: qty 30

## 2023-12-16 MED ORDER — HYDRALAZINE HCL 20 MG/ML IJ SOLN: 10.0000 mg | INTRAMUSCULAR | Status: DC | PRN

## 2023-12-16 MED ORDER — SODIUM CHLORIDE 0.9 % WEIGHT BASED INFUSION: 3.0000 mL/kg/h | INTRAVENOUS | Status: AC

## 2023-12-16 MED ORDER — SODIUM CHLORIDE 0.9 % IV SOLN: 250.0000 mL | INTRAVENOUS | Status: DC | PRN

## 2023-12-16 MED ORDER — ASPIRIN 81 MG PO CHEW: 81.0000 mg | CHEWABLE_TABLET | ORAL | Status: AC

## 2023-12-16 MED ORDER — SODIUM CHLORIDE 0.9 % WEIGHT BASED INFUSION: 1.0000 mL/kg/h | INTRAVENOUS | Status: DC

## 2023-12-16 MED ORDER — SODIUM CHLORIDE 0.9% FLUSH: 3.0000 mL | Freq: Two times a day (BID) | INTRAVENOUS | Status: DC

## 2023-12-16 MED ORDER — LIDOCAINE HCL (PF) 1 % IJ SOLN
INTRAMUSCULAR | Status: DC | PRN
  Administered 2023-12-16: 2 mL

## 2023-12-16 MED ORDER — FENTANYL CITRATE (PF) 100 MCG/2ML IJ SOLN
INTRAMUSCULAR | Status: AC
  Filled 2023-12-16: qty 2

## 2023-12-16 MED ORDER — IOHEXOL 350 MG/ML SOLN
INTRAVENOUS | Status: DC | PRN
  Administered 2023-12-16: 90 mL

## 2023-12-16 MED ORDER — MIDAZOLAM HCL 2 MG/2ML IJ SOLN
INTRAMUSCULAR | Status: DC | PRN
  Administered 2023-12-16: 1 mg via INTRAVENOUS

## 2023-12-16 MED ORDER — FENTANYL CITRATE (PF) 100 MCG/2ML IJ SOLN
INTRAMUSCULAR | Status: DC | PRN
  Administered 2023-12-16 (×2): 25 ug via INTRAVENOUS

## 2023-12-16 MED ORDER — ACETAMINOPHEN 325 MG PO TABS: 650.0000 mg | ORAL_TABLET | ORAL | Status: DC | PRN

## 2023-12-16 MED ORDER — VERAPAMIL HCL 2.5 MG/ML IV SOLN
INTRAVENOUS | Status: DC | PRN
  Administered 2023-12-16: 10 mL via INTRA_ARTERIAL

## 2023-12-16 MED ORDER — SODIUM CHLORIDE 0.9% FLUSH: 3.0000 mL | INTRAVENOUS | Status: DC | PRN

## 2023-12-16 MED ORDER — LABETALOL HCL 5 MG/ML IV SOLN: 10.0000 mg | INTRAVENOUS | Status: DC | PRN

## 2023-12-16 MED ORDER — ONDANSETRON HCL 4 MG/2ML IJ SOLN: 4.0000 mg | Freq: Four times a day (QID) | INTRAMUSCULAR | Status: DC | PRN

## 2023-12-16 SURGICAL SUPPLY — 13 items
CATH INFINITI AMBI 5FR TG (CATHETERS) IMPLANT
CATH OPTICROSS HD (CATHETERS) IMPLANT
CATH VISTA GUIDE 6FR XB3.5 EPK (CATHETERS) IMPLANT
COVER PRB 48X5XTLSCP FOLD TPE (BAG) IMPLANT
DEVICE RAD COMP TR BAND LRG (VASCULAR PRODUCTS) IMPLANT
DRAPE IVUS SLED (BAG) IMPLANT
GLIDESHEATH SLEND SS 6F .021 (SHEATH) IMPLANT
GUIDEWIRE INQWIRE 1.5J.035X260 (WIRE) IMPLANT
INQWIRE 1.5J .035X260CM (WIRE) ×1 IMPLANT
KIT ESSENTIALS PG (KITS) IMPLANT
PACK CARDIAC CATHETERIZATION (CUSTOM PROCEDURE TRAY) ×1 IMPLANT
SET ATX-X65L (MISCELLANEOUS) IMPLANT
WIRE ASAHI PROWATER 180CM (WIRE) IMPLANT

## 2023-12-16 NOTE — Interval H&P Note (Signed)
 History and Physical Interval Note:  12/16/2023 7:28 AM  Robert Huang [id number]  has presented today for surgery, with the diagnosis of cad - angina.  The various methods of treatment have been discussed with the patient and family. After consideration of risks, benefits and other options for treatment, the patient has consented to  Procedure(s): LEFT HEART CATH AND CORONARY ANGIOGRAPHY (N/A) PERCUTANEOUS CORONARY INTERVENTION    as a surgical intervention.  The patient's history has been reviewed, patient examined, no change in status, stable for surgery.  I have reviewed the patient's chart and labs.  Questions were answered to the patient's satisfaction.     Cath Lab Visit (complete for each Cath Lab visit)  Clinical Evaluation Leading to the Procedure:   ACS: No.  Non-ACS:    Anginal Classification: CCS III  Anti-ischemic medical therapy: Maximal Therapy (2 or more classes of medications)  Non-Invasive Test Results: No non-invasive testing performed  Prior CABG: No previous CABG    Robert Huang

## 2023-12-16 NOTE — Discharge Instructions (Signed)
NO METFORMIN FOR 2 DAYS 

## 2023-12-16 NOTE — Progress Notes (Addendum)
 Client c/o chest discomfort and Dr Addie Holstein notified and per Dr Addie Holstein client may take a sl ntg and then be discharged; client states wants to take NTG from home dose and refused NTG from hospital and states relief after NTG

## 2023-12-17 NOTE — Telephone Encounter (Signed)
 I cannot do both on the same day per [place] law. I suggest he come in for the physical, and we can get the UDS that day. Then we can do a virtual PMV

## 2023-12-18 NOTE — Telephone Encounter (Signed)
 Pt is aware unable to get both on same day. Pt will stick with cpe and make other appt for PMV

## 2023-12-19 NOTE — Telephone Encounter (Signed)
 Noted.

## 2023-12-20 ENCOUNTER — Encounter: Payer: Self-pay | Admitting: Family Medicine

## 2023-12-20 ENCOUNTER — Ambulatory Visit: Admitting: Family Medicine

## 2023-12-20 ENCOUNTER — Ambulatory Visit (INDEPENDENT_AMBULATORY_CARE_PROVIDER_SITE_OTHER): Admitting: Family Medicine

## 2023-12-20 VITALS — BP 100/60 | HR 60 | Temp 97.8°F | Ht 72.0 in | Wt 183.0 lb

## 2023-12-20 DIAGNOSIS — Z0000 Encounter for general adult medical examination without abnormal findings: Secondary | ICD-10-CM

## 2023-12-20 DIAGNOSIS — I10 Essential (primary) hypertension: Secondary | ICD-10-CM | POA: Diagnosis not present

## 2023-12-20 DIAGNOSIS — F1190 Opioid use, unspecified, uncomplicated: Secondary | ICD-10-CM

## 2023-12-20 DIAGNOSIS — E039 Hypothyroidism, unspecified: Secondary | ICD-10-CM | POA: Diagnosis not present

## 2023-12-20 DIAGNOSIS — E785 Hyperlipidemia, unspecified: Secondary | ICD-10-CM | POA: Diagnosis not present

## 2023-12-20 DIAGNOSIS — I2510 Atherosclerotic heart disease of native coronary artery without angina pectoris: Secondary | ICD-10-CM

## 2023-12-20 MED ORDER — NITROGLYCERIN 0.4 MG SL SUBL: 0.4000 mg | SUBLINGUAL_TABLET | SUBLINGUAL | 5 refills | Status: AC | PRN

## 2023-12-20 MED ORDER — AMLODIPINE BESYLATE 10 MG PO TABS: 5.0000 mg | ORAL_TABLET | Freq: Every day | ORAL | Status: DC

## 2023-12-20 MED ORDER — LEVOTHYROXINE SODIUM 112 MCG PO TABS: 112.0000 ug | ORAL_TABLET | Freq: Every day | ORAL | 3 refills | Status: DC

## 2023-12-20 MED ORDER — TAMSULOSIN HCL 0.4 MG PO CAPS: 0.4000 mg | ORAL_CAPSULE | Freq: Every day | ORAL | 3 refills | Status: AC

## 2023-12-20 MED ORDER — ROSUVASTATIN CALCIUM 40 MG PO TABS: 40.0000 mg | ORAL_TABLET | Freq: Every day | ORAL | 3 refills | Status: DC

## 2023-12-20 NOTE — Progress Notes (Signed)
 Subjective:    Patient ID: Robert Huang, male    DOB: 1961/04/30, 63 y.o.   MRN: [id number]  HPI Here for a well exam. He has a number of issues to discuss. He had a cardiac catheterization on 12-16-23 per Dr. Sallyanne Creamer and this came out well. He has several 40-50% lesions, but both stents are patent. EF was 55-65%he sees Dr. Emmette Harms for cardiology care. He has lost about 20 lbs of weight in the past year, but he has tried to do this by reducing his portion sizes at meals. He notes his BP has been dropping, likely as a result of the weight loss, and his BP at home yesterday was 117/64. His anxiety is stable. He is doing well after his limb amputation, although he still suffers from phantom lib pain. He is in a pain management protocol with us  for this. He uses a cane to get around outside his home, but he uses a wheelchair around his house. He complains of a slow urine stream, and he gets up to urinate frequently during the night.    Review of Systems  Constitutional: Negative.   HENT: Negative.    Eyes: Negative.   Respiratory: Negative.    Cardiovascular: Negative.   Gastrointestinal: Negative.   Genitourinary:  Positive for difficulty urinating.  Musculoskeletal: Negative.   Skin: Negative.   Neurological:  Positive for light-headedness.  Psychiatric/Behavioral: Negative.         Objective:   Physical Exam Constitutional:      General: He is not in acute distress.    Appearance: He is well-developed. He is not diaphoretic.     Comments: Using a cane   HENT:     Head: Normocephalic and atraumatic.     Right Ear: External ear normal.     Left Ear: External ear normal.     Nose: Nose normal.     Mouth/Throat:     Pharynx: No oropharyngeal exudate.  Eyes:     General: No scleral icterus.       Right eye: No discharge.        Left eye: No discharge.     Conjunctiva/sclera: Conjunctivae normal.     Pupils: Pupils are equal, round, and reactive to light.  Neck:     Thyroid :  No thyromegaly.     Vascular: No JVD.     Trachea: No tracheal deviation.  Cardiovascular:     Rate and Rhythm: Normal rate and regular rhythm.     Heart sounds: Normal heart sounds. No murmur heard.    No friction rub. No gallop.  Pulmonary:     Effort: Pulmonary effort is normal. No respiratory distress.     Breath sounds: Normal breath sounds. No wheezing or rales.  Chest:     Chest wall: No tenderness.  Abdominal:     General: Bowel sounds are normal. There is no distension.     Palpations: Abdomen is soft. There is no mass.     Tenderness: There is no abdominal tenderness. There is no guarding or rebound.  Genitourinary:    Penis: No tenderness.   Musculoskeletal:        General: No tenderness. Normal range of motion.     Cervical back: Neck supple.  Lymphadenopathy:     Cervical: No cervical adenopathy.  Skin:    General: Skin is warm and dry.     Coloration: Skin is not pale.     Findings: No erythema or rash.  Neurological:  General: No focal deficit present.     Mental Status: He is alert and oriented to person, place, and time.     Cranial Nerves: No cranial nerve deficit.     Motor: No abnormal muscle tone.     Coordination: Coordination normal.     Deep Tendon Reflexes: Reflexes are normal and symmetric. Reflexes normal.  Psychiatric:        Mood and Affect: Mood normal.        Behavior: Behavior normal.        Thought Content: Thought content normal.        Judgment: Judgment normal.           Assessment & Plan:  Well exam. We discussed diet and exercise. Get fasting labs. His HTN is over controlled, so we will reduce the Amlodipine  to 5 mg daily. He has BPH issues, so he will try Flomax 0.4 mg daily. He has a number of medical things to take care of and providers to see, so he asks for a continuous FMLA leave from 12-23-23 through 01-05-24. He will return to work on 01-06-24.  Corita Diego, MD

## 2023-12-24 LAB — DRUG MONITORING, PANEL 8 WITH CONFIRMATION, URINE
6 Acetylmorphine: NEGATIVE ng/mL (ref <10)
Alcohol Metabolites: POSITIVE ng/mL — AB (ref <500)
Alphahydroxyalprazolam: 784 ng/mL — ABNORMAL HIGH (ref <25)
Alphahydroxymidazolam: NEGATIVE ng/mL (ref <50)
Alphahydroxytriazolam: NEGATIVE ng/mL (ref <50)
Aminoclonazepam: NEGATIVE ng/mL (ref <25)
Amphetamines: NEGATIVE ng/mL (ref <500)
Benzodiazepines: POSITIVE ng/mL — AB (ref <100)
Buprenorphine, Urine: NEGATIVE ng/mL (ref <5)
Cocaine Metabolite: NEGATIVE ng/mL (ref <150)
Codeine: NEGATIVE ng/mL (ref <50)
Creatinine: 246.7 mg/dL (ref >=20.0)
Ethyl Glucuronide (ETG): 8384 ng/mL — ABNORMAL HIGH (ref <500)
Ethyl Sulfate (ETS): 1342 ng/mL — ABNORMAL HIGH (ref <100)
Hydrocodone: 6370 ng/mL — ABNORMAL HIGH (ref <50)
Hydromorphone: 1298 ng/mL — ABNORMAL HIGH (ref <50)
Hydroxyethylflurazepam: NEGATIVE ng/mL (ref <50)
Lorazepam: NEGATIVE ng/mL (ref <50)
MDMA: NEGATIVE ng/mL (ref <500)
Marijuana Metabolite: 15 ng/mL — ABNORMAL HIGH (ref <5)
Marijuana Metabolite: POSITIVE ng/mL — AB (ref <20)
Morphine: NEGATIVE ng/mL (ref <50)
Nordiazepam: NEGATIVE ng/mL (ref <50)
Norhydrocodone: 3694 ng/mL — ABNORMAL HIGH (ref <50)
Opiates: POSITIVE ng/mL — AB (ref <100)
Oxazepam: NEGATIVE ng/mL (ref <50)
Oxidant: NEGATIVE ug/mL (ref <200)
Oxycodone: NEGATIVE ng/mL (ref <100)
Temazepam: NEGATIVE ng/mL (ref <50)
pH: 5 (ref 4.5–9.0)

## 2023-12-24 LAB — HEPATIC FUNCTION PANEL
AG Ratio: 1.3 (calc) (ref 1.0–2.5)
ALT: 9 U/L (ref 9–46)
AST: 14 U/L (ref 10–35)
Albumin: 3.6 g/dL (ref 3.6–5.1)
Alkaline phosphatase (APISO): 67 U/L (ref 35–144)
Bilirubin, Direct: 0.1 mg/dL (ref 0.0–0.2)
Globulin: 2.8 g/dL (ref 1.9–3.7)
Indirect Bilirubin: 0.3 mg/dL (ref 0.2–1.2)
Total Bilirubin: 0.4 mg/dL (ref 0.2–1.2)
Total Protein: 6.4 g/dL (ref 6.1–8.1)

## 2023-12-24 LAB — BASIC METABOLIC PANEL WITH GFR
BUN/Creatinine Ratio: 10 (calc) (ref 6–22)
BUN: 18 mg/dL (ref 7–25)
CO2: 20 mmol/L (ref 20–32)
Calcium: 9.1 mg/dL (ref 8.6–10.3)
Chloride: 108 mmol/L (ref 98–110)
Creat: 1.74 mg/dL — ABNORMAL HIGH (ref 0.70–1.35)
Glucose, Bld: 129 mg/dL — ABNORMAL HIGH (ref 65–99)
Potassium: 3.7 mmol/L (ref 3.5–5.3)
Sodium: 141 mmol/L (ref 135–146)
eGFR: 43 mL/min/{1.73_m2} — ABNORMAL LOW (ref >=60)

## 2023-12-24 LAB — CBC WITH DIFFERENTIAL/PLATELET
Absolute Lymphocytes: 880 {cells}/uL (ref 850–3900)
Absolute Monocytes: 696 {cells}/uL (ref 200–950)
Basophils Absolute: 40 {cells}/uL (ref 0–200)
Basophils Relative: 0.5 %
Eosinophils Absolute: 112 {cells}/uL (ref 15–500)
Eosinophils Relative: 1.4 %
HCT: 32.4 % — ABNORMAL LOW (ref 38.5–50.0)
Hemoglobin: 10.7 g/dL — ABNORMAL LOW (ref 13.2–17.1)
MCH: 31.2 pg (ref 27.0–33.0)
MCHC: 33 g/dL (ref 32.0–36.0)
MCV: 94.5 fL (ref 80.0–100.0)
MPV: 11.3 fL (ref 7.5–12.5)
Monocytes Relative: 8.7 %
Neutro Abs: 6272 {cells}/uL (ref 1500–7800)
Neutrophils Relative %: 78.4 %
Platelets: 275 10*3/uL (ref 140–400)
RBC: 3.43 10*6/uL — ABNORMAL LOW (ref 4.20–5.80)
RDW: 12.7 % (ref 11.0–15.0)
Total Lymphocyte: 11 %
WBC: 8 10*3/uL (ref 3.8–10.8)

## 2023-12-24 LAB — PSA: PSA: 1.31 ng/mL (ref <=4.00)

## 2023-12-24 LAB — TSH: TSH: 0.11 m[IU]/L — ABNORMAL LOW (ref 0.40–4.50)

## 2023-12-24 LAB — HEMOGLOBIN A1C
Hgb A1c MFr Bld: 6.6 % — ABNORMAL HIGH (ref <5.7)
Mean Plasma Glucose: 143 mg/dL
eAG (mmol/L): 7.9 mmol/L

## 2023-12-24 LAB — LIPID PANEL
Cholesterol: 143 mg/dL (ref <200)
HDL: 35 mg/dL — ABNORMAL LOW (ref >=40)
LDL Cholesterol (Calc): 72 mg/dL
Non-HDL Cholesterol (Calc): 108 mg/dL (ref <130)
Total CHOL/HDL Ratio: 4.1 (calc) (ref <5.0)
Triglycerides: 270 mg/dL — ABNORMAL HIGH (ref <150)

## 2023-12-24 LAB — DM TEMPLATE

## 2023-12-24 LAB — T3, FREE: T3, Free: 2.6 pg/mL (ref 2.3–4.2)

## 2023-12-24 LAB — T4, FREE: Free T4: 1.4 ng/dL (ref 0.8–1.8)

## 2023-12-27 ENCOUNTER — Encounter: Payer: Self-pay | Admitting: Family Medicine

## 2023-12-30 ENCOUNTER — Encounter: Payer: Self-pay | Admitting: *Deleted

## 2023-12-30 ENCOUNTER — Telehealth: Payer: Self-pay | Admitting: Cardiology

## 2023-12-30 ENCOUNTER — Ambulatory Visit: Payer: Self-pay | Admitting: Nurse Practitioner

## 2023-12-30 NOTE — Telephone Encounter (Signed)
 I received a [employer] FMLA form on 12/19/2023.  I made several attempts to contact patient via phone messages.  Patient called me today.  I explained that we need for him to sign the release of information and to pay the $29 form fee.  He said that he lives 3 hours away. He said that he needs this by Friday, 01/03/2024.   His daughter will be coming in to pay the fee for him.  I attached the form to an email and sent to him today. Form is in Dr. Lourdes Roy box.

## 2023-12-31 ENCOUNTER — Telehealth: Payer: Self-pay | Admitting: Family Medicine

## 2023-12-31 DIAGNOSIS — Z0279 Encounter for issue of other medical certificate: Secondary | ICD-10-CM

## 2023-12-31 NOTE — Telephone Encounter (Signed)
 Copied from CRM [id number]. Topic: General - Other >> Dec 31, 2023  9:01 AM Robert Huang wrote: Reason for CRM: Patient is calling regarding FMLA forms that his company faxed over to us  yesterday. Documents: Fitness for duty (return to work) and his request is May 12th and Medical Certification form. This is all due on Friday May 9th or else patient will not get paid and possibly lise his job. Patient would like a status update if possible through MyChart or  a phone call.

## 2023-12-31 NOTE — Telephone Encounter (Signed)
 Spoke with pt advised that we received one part of the FMLA Form, awaiting for pt to email the other form

## 2024-01-01 ENCOUNTER — Encounter: Payer: Self-pay | Admitting: Family Medicine

## 2024-01-02 NOTE — Telephone Encounter (Signed)
 Pt FMLA form faxed this morning and a confirmation received. Pt notified via MyChart

## 2024-01-02 NOTE — Telephone Encounter (Signed)
 Completed [employer] form faxed to [employer] and scanned to chart.  Patient's daughter came in today and paid the $29 form fee.  Billing notified

## 2024-01-03 ENCOUNTER — Other Ambulatory Visit: Payer: Self-pay | Admitting: Family Medicine

## 2024-01-08 ENCOUNTER — Telehealth: Payer: Self-pay | Admitting: Cardiology

## 2024-01-08 NOTE — Telephone Encounter (Signed)
 I received another [employer] form today.  This one is a "fitness for duty" form. We can use the original release for [employer] since it's under 90 days old.  I have placed it in Dr. Lourdes Roy box.

## 2024-01-09 NOTE — Telephone Encounter (Signed)
 Spoke with pt advised that Dr Alyne Babinski is not in the office until 01/13/24 Pt stated that he has enough medication to last him until then, just needs refill sent in to this pharmacy since he is taking Rx more often than previous.

## 2024-01-13 MED ORDER — ALPRAZOLAM 1 MG PO TABS: 1.0000 mg | ORAL_TABLET | Freq: Three times a day (TID) | ORAL | 1 refills | Status: DC | PRN

## 2024-01-13 NOTE — Telephone Encounter (Signed)
 Completed [employer] form scanned to chart and faxed to [employer].

## 2024-01-13 NOTE — Telephone Encounter (Signed)
 Done

## 2024-01-14 ENCOUNTER — Other Ambulatory Visit: Payer: Self-pay | Admitting: Nurse Practitioner

## 2024-01-14 ENCOUNTER — Other Ambulatory Visit: Payer: Self-pay | Admitting: Family Medicine

## 2024-01-14 DIAGNOSIS — I10 Essential (primary) hypertension: Secondary | ICD-10-CM

## 2024-01-15 ENCOUNTER — Other Ambulatory Visit: Payer: Self-pay | Admitting: Family Medicine

## 2024-01-15 NOTE — Telephone Encounter (Signed)
 Copied from CRM [id number]. Topic: Clinical - Medication Refill >> Jan 15, 2024  8:09 AM Cynthia K wrote: Medication: HYDROcodone -acetaminophen  (NORCO) 10-325 MG tablet  Has the patient contacted their pharmacy? Yes (Agent: If no, request that the patient contact the pharmacy for the refill. If patient does not wish to contact the pharmacy document the reason why and proceed with request.) (Agent: If yes, when and what did the pharmacy advise?) Pharmacy needs order to refill  This is the patient's preferred pharmacy:  [facility] [address], [place] - [address] [place] [address] Phone: [phone] Fax: [phone]  Is this the correct pharmacy for this prescription? Yes If no, delete pharmacy and type the correct one.   Has the prescription been filled recently? No  Is the patient out of the medication? No  Has the patient been seen for an appointment in the last year OR does the patient have an upcoming appointment? Yes  Can we respond through MyChart? Yes  Agent: Please be advised that Rx refills may take up to 3 business days. We ask that you follow-up with your pharmacy.

## 2024-01-15 NOTE — Telephone Encounter (Signed)
 Last Fill: 10/17/23 x3 120 tabs/0 RF  Last OV: 12/20/23 Next OV: None Scheduled  Routing to provider for review/authorization.

## 2024-01-17 NOTE — Telephone Encounter (Signed)
 Spoke with pt scheduled for PMV for 01/21/24

## 2024-01-21 ENCOUNTER — Telehealth: Admitting: Family Medicine

## 2024-01-21 ENCOUNTER — Encounter: Payer: Self-pay | Admitting: Family Medicine

## 2024-01-21 DIAGNOSIS — G546 Phantom limb syndrome with pain: Secondary | ICD-10-CM

## 2024-01-21 DIAGNOSIS — F1190 Opioid use, unspecified, uncomplicated: Secondary | ICD-10-CM | POA: Diagnosis not present

## 2024-01-21 MED ORDER — HYDROCODONE-ACETAMINOPHEN 10-325 MG PO TABS: 1.0000 | ORAL_TABLET | Freq: Four times a day (QID) | ORAL | 0 refills | Status: DC | PRN

## 2024-01-21 NOTE — Progress Notes (Signed)
 Subjective:    Patient ID: Robert Huang, male    DOB: 1961/02/25, 63 y.o.   MRN: [id number]  HPI Virtual Visit via Video Note  I connected with the patient on 01/21/24 at  2:00 PM EDT by a video enabled telemedicine application and verified that I am speaking with the correct person using two identifiers.  Location patient: home Location provider:work or home office Persons participating in the virtual visit: patient, provider  I discussed the limitations of evaluation and management by telemedicine and the availability of in person appointments. The patient expressed understanding and agreed to proceed.   HPI: Here pain management, he is doing well.    ROS: See pertinent positives and negatives per HPI.  Past Medical History:  Diagnosis Date   Anxiety    CAD (coronary artery disease)    sees Dr. Audery Blazing, normal Stress test 07-16-12    Diabetes mellitus    DJD (degenerative joint disease)    Gout    Hyperlipidemia    Hypertension    Hypothyroid    Osteoarthritis    Renal calculus    hx   Umbilical hernia     Past Surgical History:  Procedure Laterality Date   AMPUTATION  07/17/2012   Procedure: AMPUTATION RAY;  Surgeon: Amada Backer, MD;  Location: [facility] OR;  Service: Orthopedics;  Laterality: Right;  right hallux amputation (1st ray resection )   AMPUTATION Left 03/06/2019   Procedure: Ray Amputation left foot;  Surgeon: Janeth Medicus, MD;  Location: [facility] ORS;  Service: Orthopedics;  Laterality: Left;   AMPUTATION Left 10/27/2021   Procedure: LEFT BELOW KNEE AMPUTATION;  Surgeon: Timothy Ford, MD;  Location[facility] OR;  Service: Orthopedics;  Laterality: Left;   CORONARY ANGIOPLASTY WITH STENT PLACEMENT     CORONARY LITHOTRIPSY N/A 05/28/2023   Procedure: CORONARY LITHOTRIPSY;  Surgeon: Swaziland, Peter M, MD;  Location: [facility] INVASIVE CV LAB;  Service: Cardiovascular;  Laterality: N/A;   CORONARY STENT INTERVENTION N/A 05/28/2023   Procedure: CORONARY STENT  INTERVENTION;  Surgeon: Swaziland, Peter M, MD;  Location: [facility] INVASIVE CV LAB;  Service: Cardiovascular;  Laterality: N/A;   CORONARY ULTRASOUND/IVUS N/A 12/16/2023   Procedure: Coronary Ultrasound/IVUS;  Surgeon: Arleen Lacer, MD;  Location: [facility] INVASIVE CV LAB;  Service: Cardiovascular;  Laterality: N/A;   FINGER SURGERY     left index finger   I & D EXTREMITY  05/19/2012   Procedure: IRRIGATION AND DEBRIDEMENT EXTREMITY;  Surgeon: Glo Larch, MD;  Location: [facility] OR;  Service: Orthopedics;  Laterality: Right;  Right Great toe   I & D EXTREMITY Left 10/25/2021   Procedure: LEFT FOOT DEDRIDEMENT;  Surgeon: Timothy Ford, MD;  Location: [facility] OR;  Service: Orthopedics;  Laterality: Left;   I & D Toe  05/19/2012   rt great toe   LEFT HEART CATH AND CORONARY ANGIOGRAPHY N/A 05/28/2023   Procedure: LEFT HEART CATH AND CORONARY ANGIOGRAPHY;  Surgeon: Swaziland, Peter M, MD;  Location: [facility] INVASIVE CV LAB;  Service: Cardiovascular;  Laterality: N/A;   LEFT HEART CATH AND CORONARY ANGIOGRAPHY N/A 12/16/2023   Procedure: LEFT HEART CATH AND CORONARY ANGIOGRAPHY;  Surgeon: Arleen Lacer, MD;  Location: [facility] INVASIVE CV LAB;  Service: Cardiovascular;  Laterality: N/A;   TONSILLECTOMY     TRANSMETATARSAL AMPUTATION Left 12/10/2020   Procedure: TRANSMETATARSAL AMPUTATION LEFT;  Surgeon: Timothy Ford, MD;  Location: [facility] OR;  Service: Orthopedics;  Laterality: Left;    Family History  Problem Relation  Age of Onset   Heart attack Mother    Arthritis Other    Coronary artery disease Other    Diabetes Other    Hypertension Other    Prostate cancer Other    Stroke Other      Current Outpatient Medications:    ALPRAZolam  (XANAX ) 1 MG tablet, Take 1 tablet (1 mg total) by mouth 3 (three) times daily as needed for anxiety., Disp: 270 tablet, Rfl: 1   amLODipine  (NORVASC ) 10 MG tablet, TAKE 1 TABLET BY MOUTH EVERY DAY, Disp: 90 tablet, Rfl: 1   aspirin  81 MG chewable tablet, Chew 1 tablet (81 mg total) by  mouth daily., Disp: 90 tablet, Rfl: 2   cloNIDine  (CATAPRES ) 0.3 MG tablet, TAKE 1 TABLET BY MOUTH TWICE A DAY, Disp: 180 tablet, Rfl: 1   clopidogrel  (PLAVIX ) 75 MG tablet, TAKE 1 TABLET BY MOUTH EVERY DAY, Disp: 90 tablet, Rfl: 2   colchicine  0.6 MG tablet, Take 1 tablet (0.6 mg total) by mouth every 6 (six) hours as needed (gout)., Disp: 60 tablet, Rfl: 11   cyanocobalamin  (VITAMIN B12) 1000 MCG/ML injection, INJECT 1 ML INTO THE MUSCLE ONCE A WEEK, Disp: 12 mL, Rfl: 0   ezetimibe  (ZETIA ) 10 MG tablet, TAKE 1 TABLET BY MOUTH EVERY DAY, Disp: 90 tablet, Rfl: 1   fenofibrate  160 MG tablet, TAKE 1 TABLET BY MOUTH EVERY DAY, Disp: 90 tablet, Rfl: 1   fluticasone  (FLONASE ) 50 MCG/ACT nasal spray, Place 2 sprays into both nostrils daily., Disp: 48 g, Rfl: 3   glipiZIDE  (GLUCOTROL ) 5 MG tablet, TAKE 1 TABLET BY MOUTH TWICE A DAY (Patient taking differently: Take 5 mg by mouth daily as needed (If blood gluose is over 200/ take a bite of pill).), Disp: 180 tablet, Rfl: 1   hydrALAZINE  (APRESOLINE ) 10 MG tablet, TAKE 1 TABLET (10 MG TOTAL) BY MOUTH IN THE MORNING AND AT BEDTIME, Disp: 180 tablet, Rfl: 3   HYDROcodone -acetaminophen  (NORCO) 10-325 MG tablet, Take 1 tablet by mouth every 6 (six) hours as needed for moderate pain (pain score 4-6)., Disp: 120 tablet, Rfl: 0   HYDROcodone -acetaminophen  (NORCO) 10-325 MG tablet, Take 1 tablet by mouth every 6 (six) hours as needed for moderate pain (pain score 4-6)., Disp: 120 tablet, Rfl: 0   HYDROcodone -acetaminophen  (NORCO) 10-325 MG tablet, Take 1 tablet by mouth every 6 (six) hours as needed for moderate pain (pain score 4-6)., Disp: 120 tablet, Rfl: 0   indomethacin  (INDOCIN  SR) 75 MG CR capsule, TAKE 1 CAPSULE BY MOUTH TWICE A DAY WITH A MEAL (Patient taking differently: Take 75 mg by mouth daily as needed (Gout/ arthritis).), Disp: 60 capsule, Rfl: 2   isosorbide  mononitrate (IMDUR ) 30 MG 24 hr tablet, Take 1 tablet (30 mg total) by mouth daily., Disp: 90  tablet, Rfl: 3   levothyroxine  (SYNTHROID ) 112 MCG tablet, Take 1 tablet (112 mcg total) by mouth daily., Disp: 90 tablet, Rfl: 3   lisinopril -hydrochlorothiazide  (ZESTORETIC ) 20-12.5 MG tablet, TAKE 1 TABLET TWICE TIMES DAILY AT 10AM AND 5PM, Disp: 180 tablet, Rfl: 1   metFORMIN  (GLUCOPHAGE ) 500 MG tablet, Take 2 tablets (1,000 mg total) by mouth 2 (two) times daily with a meal., Disp: 360 tablet, Rfl: 3   mupirocin  ointment (BACTROBAN ) 2 %, APPLY TO AFFECTED AREA TWICE A DAY, Disp: 22 g, Rfl: 2   nitroGLYCERIN  (NITROSTAT ) 0.4 MG SL tablet, Place 1 tablet (0.4 mg total) under the tongue every 5 (five) minutes as needed for chest pain. For chest pain., Disp:  25 tablet, Rfl: 5   omeprazole  (PRILOSEC) 40 MG capsule, TAKE 1 CAPSULE (40 MG TOTAL) BY MOUTH DAILY., Disp: 90 capsule, Rfl: 1   ONETOUCH ULTRA test strip, 1 EACH BY OTHER ROUTE DAILY. USE AS INSTRUCTED, Disp: 100 strip, Rfl: 1   OVER THE COUNTER MEDICATION, Apply 1 patch topically as needed (Energy and Nutrition). Nutrition patches, Disp: , Rfl:    pantoprazole  (PROTONIX ) 40 MG tablet, TAKE 1 TABLET BY MOUTH EVERY DAY, Disp: 30 tablet, Rfl: 9   rosuvastatin  (CRESTOR ) 40 MG tablet, Take 1 tablet (40 mg total) by mouth daily., Disp: 90 tablet, Rfl: 3   Syringe/Needle, Disp, (SYRINGE 3CC/25GX1") 25G X 1" 3 ML MISC, 1 application by Does not apply route once a week., Disp: 50 each, Rfl: 5   tamsulosin  (FLOMAX ) 0.4 MG CAPS capsule, Take 1 capsule (0.4 mg total) by mouth daily., Disp: 90 capsule, Rfl: 3   zolpidem  (AMBIEN ) 10 MG tablet, TAKE 1 TABLET (10 MG TOTAL) BY MOUTH AT BEDTIME AS NEEDED. FOR SLEEP, Disp: 30 tablet, Rfl: 5   ferrous sulfate  325 (65 FE) MG EC tablet, Take 1 tablet (325 mg total) by mouth 2 (two) times daily. (Patient taking differently: Take 325 mg by mouth daily with breakfast.), Disp: 60 tablet, Rfl: 0   metoprolol  tartrate (LOPRESSOR ) 25 MG tablet, Take 1 tablet (25 mg total) by mouth 2 (two) times daily., Disp: 180 tablet,  Rfl: 3  EXAM:  VITALS per patient if applicable:  GENERAL: alert, oriented, appears well and in no acute distress  HEENT: atraumatic, conjunttiva clear, no obvious abnormalities on inspection of external nose and ears  NECK: normal movements of the head and neck  LUNGS: on inspection no signs of respiratory distress, breathing rate appears normal, no obvious gross SOB, gasping or wheezing  CV: no obvious cyanosis  MS: moves all visible extremities without noticeable abnormality  PSYCH/NEURO: pleasant and cooperative, no obvious depression or anxiety, speech and thought processing grossly intact  ASSESSMENT AND PLAN: Pain management. Indication for chronic opioid: phantom limb pain  Medication and dose: Norco 10-325 # pills per month: 120 Last UDS date: 12-20-23 Opioid Treatment Agreement signed (Y/N): 12-10-18 Opioid Treatment Agreement last reviewed with patient:  01-21-24 [facility] reviewed this encounter (include red flags): Yes Meds were refilled.  Corita Diego, MD  Discussed the following assessment and plan:  No diagnosis found.     I discussed the assessment and treatment plan with the patient. The patient was provided an opportunity to ask questions and all were answered. The patient agreed with the plan and demonstrated an understanding of the instructions.   The patient was advised to call back or seek an in-person evaluation if the symptoms worsen or if the condition fails to improve as anticipated.      Review of Systems     Objective:   Physical Exam        Assessment & Plan:

## 2024-01-27 DIAGNOSIS — I10 Essential (primary) hypertension: Secondary | ICD-10-CM | POA: Diagnosis not present

## 2024-02-21 ENCOUNTER — Other Ambulatory Visit: Payer: Self-pay | Admitting: Family Medicine

## 2024-02-21 DIAGNOSIS — E7800 Pure hypercholesterolemia, unspecified: Secondary | ICD-10-CM

## 2024-03-03 ENCOUNTER — Ambulatory Visit (INDEPENDENT_AMBULATORY_CARE_PROVIDER_SITE_OTHER): Admitting: Orthopedic Surgery

## 2024-03-03 ENCOUNTER — Encounter: Payer: Self-pay | Admitting: Orthopedic Surgery

## 2024-03-03 DIAGNOSIS — Z89512 Acquired absence of left leg below knee: Secondary | ICD-10-CM | POA: Diagnosis not present

## 2024-03-03 DIAGNOSIS — S88112A Complete traumatic amputation at level between knee and ankle, left lower leg, initial encounter: Secondary | ICD-10-CM

## 2024-03-03 NOTE — Progress Notes (Signed)
 Office Visit Note   Patient: Robert Huang Hmpwidujqq           Date of Birth: 1961-07-31           MRN: [id number] Visit Date: 03/03/2024              Requested by: Johnny Garnette LABOR, MD [address] [place],  [place] [address] PCP: Johnny Garnette LABOR, MD  Chief Complaint  Patient presents with   Left Leg - Follow-up    Hx left BKA      HPI: Patient is a 63 year old gentleman who is a left transtibial amputee.  Patient is active with outdoor activities and he currently cannot perform his activities with the current prosthesis.  He is subsiding into the socket he feels the leg is too heavy and the foot does not accommodate uneven terrain.  Patient has tried fishing and feels unsafe with his current prosthesis.  Assessment & Plan: Visit Diagnoses:  1. Below-knee amputation of left lower extremity, initial encounter [facility])     Plan: A prescription was provided for a new socket liner materials and supplies and a new foot and ankle.  Patient will need a foot and ankle to allow for activities on uneven terrain.  Patient has requested a [employer] socket.  Patient is a K3 level ambulator.  Follow-Up Instructions: Return if symptoms worsen or fail to improve.   Ortho Exam  Patient is alert, oriented, no adenopathy, well-dressed, normal affect, normal respiratory effort. Examination patient is subsiding into the socket.  His liner is worn.  Patient is an existing left transtibial  amputee.  Patient's current comorbidities are not expected to impact the ability to function with the prescribed prosthesis. Patient verbally communicates a strong desire to use a prosthesis. Patient currently requires mobility aids to ambulate without a prosthesis.  Expects not to use mobility aids with a new prosthesis. Patient is expected to resume or reach their K Level within 6 months. Patient was active before the amputation and independent with stairs, uneven terrain, varying cadence, and a  community ambulator.  Patient is a K3 level ambulator that spends a lot of time walking around on uneven terrain over obstacles, up and down stairs, and ambulates with a variable cadence.       Imaging: No results found. No images are attached to the encounter.  Labs: Lab Results  Component Value Date   HGBA1C 6.6 (H) 12/20/2023   HGBA1C 6.7 (A) 07/16/2023   HGBA1C 6.8 (H) 03/11/2023   ESRSEDRATE >140 (H) 10/22/2021   ESRSEDRATE 50 (H) 03/02/2019   ESRSEDRATE 63 (H) 07/17/2012   CRP 20.1 (H) 10/22/2021   CRP 2.9 (H) 03/02/2019   CRP 1.3 (H) 07/17/2012   LABURIC 7.2 12/03/2017   LABURIC 7.6 01/11/2017   LABURIC 12.7 (H) 11/05/2014   REPTSTATUS 11/02/2021 FINAL 10/25/2021   GRAMSTAIN  10/25/2021    RARE WBC PRESENT, PREDOMINANTLY MONONUCLEAR RARE GRAM POSITIVE COCCI IN PAIRS    CULT  10/25/2021    FEW PROTEUS MIRABILIS RARE ENTEROCOCCUS FAECALIS RARE BACTEROIDES FRAGILIS BETA LACTAMASE POSITIVE Performed at [facility], [address], [place], [place] [address]    IDOLINA PROTEUS MIRABILIS 10/25/2021   LABORGA ENTEROCOCCUS FAECALIS 10/25/2021     Lab Results  Component Value Date   ALBUMIN 3.4 (L) 12/12/2023   ALBUMIN 3.7 03/11/2023   ALBUMIN 4.1 01/23/2022    Lab Results  Component Value Date   MG 1.4 (L) 12/12/2023   MG 1.6 (  L) 10/26/2021   MG 1.5 (L) 10/25/2021   No results found for: VD25OH  No results found for: PREALBUMIN    Latest Ref Rng & Units 12/20/2023    3:43 PM 12/12/2023    4:25 PM 05/21/2023    8:33 AM  CBC EXTENDED  WBC 3.8 - 10.8 Thousand/uL 8.0  6.3  4.1   RBC 4.20 - 5.80 Million/uL 3.43  3.49  3.80   Hemoglobin 13.2 - 17.1 g/dL 89.2  89.0  87.8   HCT 38.5 - 50.0 % 32.4  32.0  37.1   Platelets 140 - 400 Thousand/uL 275  237  241   NEUT# 1,500 - 7,800 cells/uL 6,272        There is no height or weight on file to calculate BMI.  Orders:  No orders of the defined types were placed in this encounter.  No orders of  the defined types were placed in this encounter.    Procedures: No procedures performed  Clinical Data: No additional findings.  ROS:  All other systems negative, except as noted in the HPI. Review of Systems  Objective: Vital Signs: There were no vitals taken for this visit.  Specialty Comments:  No specialty comments available.  PMFS History: Patient Active Problem List   Diagnosis Date Noted   Allergic rhinitis 07/16/2023   Amputation below knee (HCC) 03/29/2023   B12 deficiency 03/11/2023   Phantom limb pain (HCC) 03/07/2022   Abscess of left foot    History of transmetatarsal amputation of left foot (HCC)    Severe protein-calorie malnutrition (HCC)    CKD (chronic kidney disease) stage 2, GFR 60-89 ml/min 10/22/2021   Hyponatremia 10/22/2021   Chronic foot pain 08/29/2021   Iron deficiency anemia 12/19/2020   Cellulitis of left foot    History of complete ray amputation of fifth toe of left foot (HCC)    Status post amputation of right great toe (HCC)    Diabetic foot infection (HCC) 12/08/2020   Diabetic foot ulcers (HCC) 03/02/2019   Osteomyelitis of fourth toe of left foot (HCC) 03/02/2019   Hypokalemia 03/02/2019   Chronic right shoulder pain 12/02/2018   Diabetes mellitus with complication (HCC) 12/03/2017   Right hip pain 12/27/2015   MRSA (methicillin resistant staph aureus) culture positive 12/31/2012   Open displaced fracture of right great toe 05/20/2012   Anxiety state 12/25/2008   Coronary atherosclerosis 12/25/2008   CHEST PAIN 12/25/2008   Hypothyroidism 05/27/2007   Dyslipidemia 05/27/2007   Gout 05/27/2007   Essential hypertension 05/27/2007   Osteoarthritis 05/27/2007   RENAL CALCULUS, HX OF 05/27/2007   Past Medical History:  Diagnosis Date   Anxiety    CAD (coronary artery disease)    sees Dr. Pietro, normal Stress test 07-16-12    Diabetes mellitus    DJD (degenerative joint disease)    Gout    Hyperlipidemia    Hypertension     Hypothyroid    Osteoarthritis    Renal calculus    hx   Umbilical hernia     Family History  Problem Relation Age of Onset   Heart attack Mother    Arthritis Other    Coronary artery disease Other    Diabetes Other    Hypertension Other    Prostate cancer Other    Stroke Other     Past Surgical History:  Procedure Laterality Date   AMPUTATION  07/17/2012   Procedure: AMPUTATION RAY;  Surgeon: Norleen Armor, MD;  Location: [facility] OR;  Service: Orthopedics;  Laterality: Right;  right hallux amputation (1st ray resection )   AMPUTATION Left 03/06/2019   Procedure: Ray Amputation left foot;  Surgeon: Sharl Selinda Dover, MD;  Location: [facility] ORS;  Service: Orthopedics;  Laterality: Left;   AMPUTATION Left 10/27/2021   Procedure: LEFT BELOW KNEE AMPUTATION;  Surgeon: Harden Jerona GAILS, MD;  Location: [facility] OR;  Service: Orthopedics;  Laterality: Left;   CORONARY ANGIOPLASTY WITH STENT PLACEMENT     CORONARY LITHOTRIPSY N/A 05/28/2023   Procedure: CORONARY LITHOTRIPSY;  Surgeon: Swaziland, Peter M, MD;  Location: [facility] INVASIVE CV LAB;  Service: Cardiovascular;  Laterality: N/A;   CORONARY STENT INTERVENTION N/A 05/28/2023   Procedure: CORONARY STENT INTERVENTION;  Surgeon: Swaziland, Peter M, MD;  Location: [facility] INVASIVE CV LAB;  Service: Cardiovascular;  Laterality: N/A;   CORONARY ULTRASOUND/IVUS N/A 12/16/2023   Procedure: Coronary Ultrasound/IVUS;  Surgeon: Anner Alm ORN, MD;  Location: [facility] INVASIVE CV LAB;  Service: Cardiovascular;  Laterality: N/A;   FINGER SURGERY     left index finger   I & D EXTREMITY  05/19/2012   Procedure: IRRIGATION AND DEBRIDEMENT EXTREMITY;  Surgeon: Franky CHRISTELLA Pointer, MD;  Location: [facility] OR;  Service: Orthopedics;  Laterality: Right;  Right Great toe   I & D EXTREMITY Left 10/25/2021   Procedure: LEFT FOOT DEDRIDEMENT;  Surgeon: Harden Jerona GAILS, MD;  Location: [facility] OR;  Service: Orthopedics;  Laterality: Left;   I & D Toe  05/19/2012   rt great toe   LEFT HEART CATH AND  CORONARY ANGIOGRAPHY N/A 05/28/2023   Procedure: LEFT HEART CATH AND CORONARY ANGIOGRAPHY;  Surgeon: Swaziland, Peter M, MD;  Location: [facility] INVASIVE CV LAB;  Service: Cardiovascular;  Laterality: N/A;   LEFT HEART CATH AND CORONARY ANGIOGRAPHY N/A 12/16/2023   Procedure: LEFT HEART CATH AND CORONARY ANGIOGRAPHY;  Surgeon: Anner Alm ORN, MD;  Location: [facility] INVASIVE CV LAB;  Service: Cardiovascular;  Laterality: N/A;   TONSILLECTOMY     TRANSMETATARSAL AMPUTATION Left 12/10/2020   Procedure: TRANSMETATARSAL AMPUTATION LEFT;  Surgeon: Harden Jerona GAILS, MD;  Location: [facility] OR;  Service: Orthopedics;  Laterality: Left;   Social History   Occupational History   Not on file  Tobacco Use   Smoking status: Never   Smokeless tobacco: Current    Types: Chew   Tobacco comments:    Occ    Patient chew occasionally   Vaping Use   Vaping status: Never Used  Substance and Sexual Activity   Alcohol use: Yes    Alcohol/week: 0.0 standard drinks of alcohol    Comment: weekends   Drug use: No   Sexual activity: Yes

## 2024-03-24 ENCOUNTER — Other Ambulatory Visit: Payer: Self-pay | Admitting: Family Medicine

## 2024-04-03 ENCOUNTER — Telehealth: Payer: Self-pay | Admitting: Family

## 2024-04-03 ENCOUNTER — Telehealth: Payer: Self-pay | Admitting: Orthopedic Surgery

## 2024-04-03 NOTE — Telephone Encounter (Signed)
 Patient called says the company for his leg is waiting on a signed order from [facility] or Dr. Harden for his leg. [employer] is the name of the company. It was faxed 03/11/24

## 2024-04-03 NOTE — Telephone Encounter (Signed)
 Patient called. Says the orders for his leg was faxed on the 7/13. Would like to know when he would hear from [employer]. His cb# [phone]

## 2024-04-06 ENCOUNTER — Encounter: Payer: Self-pay | Admitting: Orthopedic Surgery

## 2024-04-06 NOTE — Telephone Encounter (Signed)
 This was received on 04/03/2024 and is in folder for signature.

## 2024-04-06 NOTE — Telephone Encounter (Signed)
 We just got the form on Friday 04/03/24. It is on Dr. Crist desk to sign and will be faxed by medical records.

## 2024-04-08 ENCOUNTER — Encounter: Payer: Self-pay | Admitting: Family Medicine

## 2024-04-10 NOTE — Telephone Encounter (Signed)
 He will need a PMV for this (virtual is okay)

## 2024-04-12 ENCOUNTER — Other Ambulatory Visit: Payer: Self-pay | Admitting: Family Medicine

## 2024-04-13 ENCOUNTER — Telehealth: Payer: Self-pay

## 2024-04-13 ENCOUNTER — Telehealth: Admitting: Family Medicine

## 2024-04-13 DIAGNOSIS — G546 Phantom limb syndrome with pain: Secondary | ICD-10-CM

## 2024-04-13 DIAGNOSIS — Z1211 Encounter for screening for malignant neoplasm of colon: Secondary | ICD-10-CM

## 2024-04-13 DIAGNOSIS — G8929 Other chronic pain: Secondary | ICD-10-CM

## 2024-04-13 MED ORDER — HYDROCODONE-ACETAMINOPHEN 10-325 MG PO TABS: 1.0000 | ORAL_TABLET | Freq: Four times a day (QID) | ORAL | 0 refills | Status: DC | PRN

## 2024-04-13 NOTE — Progress Notes (Signed)
 Subjective:    Patient ID: Robert Huang Hmpwidujqq, male    DOB: August 27, 1961, 63 y.o.   MRN: [id number]  HPI Virtual Visit via Video Note  I connected with the patient on 04/13/24 at  1:30 PM EDT by a video enabled telemedicine application and verified that I am speaking with the correct person using two identifiers.  Location patient: home Location provider:work or home office Persons participating in the virtual visit: patient, provider  I discussed the limitations of evaluation and management by telemedicine and the availability of in person appointments. The patient expressed understanding and agreed to proceed.   HPI: Here for pain management. He is doing well. He is being fitted with an up to date prosthesis which is much lighter than the one he has now.    ROS: See pertinent positives and negatives per HPI.  Past Medical History:  Diagnosis Date   Anxiety    CAD (coronary artery disease)    sees Dr. Pietro, normal Stress test 07-16-12    Diabetes mellitus    DJD (degenerative joint disease)    Gout    Hyperlipidemia    Hypertension    Hypothyroid    Osteoarthritis    Renal calculus    hx   Umbilical hernia     Past Surgical History:  Procedure Laterality Date   AMPUTATION  07/17/2012   Procedure: AMPUTATION RAY;  Surgeon: Norleen Armor, MD;  Location: [facility] OR;  Service: Orthopedics;  Laterality: Right;  right hallux amputation (1st ray resection )   AMPUTATION Left 03/06/2019   Procedure: Ray Amputation left foot;  Surgeon: Sharl Selinda Dover, MD;  Location: [facility] ORS;  Service: Orthopedics;  Laterality: Left;   AMPUTATION Left 10/27/2021   Procedure: LEFT BELOW KNEE AMPUTATION;  Surgeon: Harden Jerona GAILS, MD;  Location: [facility] OR;  Service: Orthopedics;  Laterality: Left;   CORONARY ANGIOPLASTY WITH STENT PLACEMENT     CORONARY LITHOTRIPSY N/A 05/28/2023   Procedure: CORONARY LITHOTRIPSY;  Surgeon: Swaziland, Peter M, MD;  Location: [facility] INVASIVE CV LAB;  Service:  Cardiovascular;  Laterality: N/A;   CORONARY STENT INTERVENTION N/A 05/28/2023   Procedure: CORONARY STENT INTERVENTION;  Surgeon: Swaziland, Peter M, MD;  Location: [facility] INVASIVE CV LAB;  Service: Cardiovascular;  Laterality: N/A;   CORONARY ULTRASOUND/IVUS N/A 12/16/2023   Procedure: Coronary Ultrasound/IVUS;  Surgeon: Anner Alm ORN, MD;  Location: [facility] INVASIVE CV LAB;  Service: Cardiovascular;  Laterality: N/A;   FINGER SURGERY     left index finger   I & D EXTREMITY  05/19/2012   Procedure: IRRIGATION AND DEBRIDEMENT EXTREMITY;  Surgeon: Franky CHRISTELLA Pointer, MD;  Location: [facility] OR;  Service: Orthopedics;  Laterality: Right;  Right Great toe   I & D EXTREMITY Left 10/25/2021   Procedure: LEFT FOOT DEDRIDEMENT;  Surgeon: Harden Jerona GAILS, MD;  Location: [facility] OR;  Service: Orthopedics;  Laterality: Left;   I & D Toe  05/19/2012   rt great toe   LEFT HEART CATH AND CORONARY ANGIOGRAPHY N/A 05/28/2023   Procedure: LEFT HEART CATH AND CORONARY ANGIOGRAPHY;  Surgeon: Swaziland, Peter M, MD;  Location: [facility] INVASIVE CV LAB;  Service: Cardiovascular;  Laterality: N/A;   LEFT HEART CATH AND CORONARY ANGIOGRAPHY N/A 12/16/2023   Procedure: LEFT HEART CATH AND CORONARY ANGIOGRAPHY;  Surgeon: Anner Alm ORN, MD;  Location: [facility] INVASIVE CV LAB;  Service: Cardiovascular;  Laterality: N/A;   TONSILLECTOMY     TRANSMETATARSAL AMPUTATION Left 12/10/2020   Procedure: TRANSMETATARSAL AMPUTATION LEFT;  Surgeon: Harden,  Jerona GAILS, MD;  Location: [facility] OR;  Service: Orthopedics;  Laterality: Left;    Family History  Problem Relation Age of Onset   Heart attack Mother    Arthritis Other    Coronary artery disease Other    Diabetes Other    Hypertension Other    Prostate cancer Other    Stroke Other      Current Outpatient Medications:    ALPRAZolam  (XANAX ) 1 MG tablet, Take 1 tablet (1 mg total) by mouth 3 (three) times daily as needed for anxiety., Disp: 270 tablet, Rfl: 1   aspirin  81 MG chewable tablet, Chew 1 tablet (81  mg total) by mouth daily., Disp: 90 tablet, Rfl: 2   cloNIDine  (CATAPRES ) 0.3 MG tablet, TAKE 1 TABLET BY MOUTH TWICE A DAY (Patient taking differently: Take 0.3 mg by mouth at bedtime.), Disp: 180 tablet, Rfl: 1   clopidogrel  (PLAVIX ) 75 MG tablet, TAKE 1 TABLET BY MOUTH EVERY DAY, Disp: 90 tablet, Rfl: 2   colchicine  0.6 MG tablet, TAKE 1 TABLET (0.6 MG TOTAL) BY MOUTH EVERY 6 (SIX) HOURS AS NEEDED (GOUT)., Disp: 60 tablet, Rfl: 11   cyanocobalamin  (VITAMIN B12) 1000 MCG/ML injection, INJECT 1 ML INTO THE MUSCLE ONCE A WEEK, Disp: 12 mL, Rfl: 0   ezetimibe  (ZETIA ) 10 MG tablet, TAKE 1 TABLET BY MOUTH EVERY DAY, Disp: 90 tablet, Rfl: 1   fenofibrate  160 MG tablet, TAKE 1 TABLET BY MOUTH EVERY DAY, Disp: 90 tablet, Rfl: 1   ferrous sulfate  325 (65 FE) MG EC tablet, Take 1 tablet (325 mg total) by mouth 2 (two) times daily. (Patient taking differently: Take 325 mg by mouth daily with breakfast.), Disp: 60 tablet, Rfl: 0   fluticasone  (FLONASE ) 50 MCG/ACT nasal spray, Place 2 sprays into both nostrils daily., Disp: 48 g, Rfl: 3   glipiZIDE  (GLUCOTROL ) 5 MG tablet, TAKE 1 TABLET BY MOUTH TWICE A DAY (Patient taking differently: Take 5 mg by mouth daily as needed (If blood gluose is over 200/ take a bite of pill).), Disp: 180 tablet, Rfl: 1   hydrALAZINE  (APRESOLINE ) 10 MG tablet, TAKE 1 TABLET (10 MG TOTAL) BY MOUTH IN THE MORNING AND AT BEDTIME, Disp: 180 tablet, Rfl: 3   HYDROcodone -acetaminophen  (NORCO) 10-325 MG tablet, Take 1 tablet by mouth every 6 (six) hours as needed for moderate pain (pain score 4-6)., Disp: 120 tablet, Rfl: 0   HYDROcodone -acetaminophen  (NORCO) 10-325 MG tablet, Take 1 tablet by mouth every 6 (six) hours as needed for moderate pain (pain score 4-6)., Disp: 120 tablet, Rfl: 0   HYDROcodone -acetaminophen  (NORCO) 10-325 MG tablet, Take 1 tablet by mouth every 6 (six) hours as needed for moderate pain (pain score 4-6)., Disp: 120 tablet, Rfl: 0   indomethacin  (INDOCIN  SR) 75 MG CR  capsule, TAKE 1 CAPSULE BY MOUTH TWICE A DAY WITH A MEAL, Disp: 60 capsule, Rfl: 5   isosorbide  mononitrate (IMDUR ) 30 MG 24 hr tablet, Take 1 tablet (30 mg total) by mouth daily., Disp: 90 tablet, Rfl: 3   levothyroxine  (SYNTHROID ) 112 MCG tablet, Take 1 tablet (112 mcg total) by mouth daily., Disp: 90 tablet, Rfl: 3   lisinopril -hydrochlorothiazide  (ZESTORETIC ) 20-12.5 MG tablet, TAKE 1 TABLET TWICE TIMES DAILY AT 10AM AND 5PM, Disp: 180 tablet, Rfl: 1   metFORMIN  (GLUCOPHAGE ) 500 MG tablet, Take 2 tablets (1,000 mg total) by mouth 2 (two) times daily with a meal., Disp: 360 tablet, Rfl: 3   metoprolol  tartrate (LOPRESSOR ) 25 MG tablet, Take 1 tablet (25  mg total) by mouth 2 (two) times daily. (Patient taking differently: Take 25 mg by mouth daily.), Disp: 180 tablet, Rfl: 3   mupirocin  ointment (BACTROBAN ) 2 %, APPLY TO AFFECTED AREA TWICE A DAY, Disp: 22 g, Rfl: 2   nitroGLYCERIN  (NITROSTAT ) 0.4 MG SL tablet, Place 1 tablet (0.4 mg total) under the tongue every 5 (five) minutes as needed for chest pain. For chest pain., Disp: 25 tablet, Rfl: 5   omeprazole  (PRILOSEC) 40 MG capsule, TAKE 1 CAPSULE (40 MG TOTAL) BY MOUTH DAILY., Disp: 90 capsule, Rfl: 1   ONETOUCH ULTRA test strip, 1 EACH BY OTHER ROUTE DAILY. USE AS INSTRUCTED, Disp: 100 strip, Rfl: 1   OVER THE COUNTER MEDICATION, Apply 1 patch topically as needed (Energy and Nutrition). Nutrition patches, Disp: , Rfl:    pantoprazole  (PROTONIX ) 40 MG tablet, TAKE 1 TABLET BY MOUTH EVERY DAY, Disp: 30 tablet, Rfl: 9   rosuvastatin  (CRESTOR ) 40 MG tablet, Take 1 tablet (40 mg total) by mouth daily., Disp: 90 tablet, Rfl: 3   Syringe/Needle, Disp, (SYRINGE 3CC/25GX1) 25G X 1 3 ML MISC, 1 application by Does not apply route once a week., Disp: 50 each, Rfl: 5   tamsulosin  (FLOMAX ) 0.4 MG CAPS capsule, Take 1 capsule (0.4 mg total) by mouth daily., Disp: 90 capsule, Rfl: 3   zolpidem  (AMBIEN ) 10 MG tablet, TAKE 1 TABLET (10 MG TOTAL) BY MOUTH AT  BEDTIME AS NEEDED. FOR SLEEP, Disp: 30 tablet, Rfl: 5   amLODipine  (NORVASC ) 10 MG tablet, TAKE 1 TABLET BY MOUTH EVERY DAY (Patient not taking: Reported on 04/13/2024), Disp: 90 tablet, Rfl: 1  EXAM:  VITALS per patient if applicable:  GENERAL: alert, oriented, appears well and in no acute distress  HEENT: atraumatic, conjunttiva clear, no obvious abnormalities on inspection of external nose and ears  NECK: normal movements of the head and neck  LUNGS: on inspection no signs of respiratory distress, breathing rate appears normal, no obvious gross SOB, gasping or wheezing  CV: no obvious cyanosis  MS: moves all visible extremities without noticeable abnormality  PSYCH/NEURO: pleasant and cooperative, no obvious depression or anxiety, speech and thought processing grossly intact  ASSESSMENT AND PLAN: Pain management.  Indication for chronic opioid: phantom limb pain  Medication and dose: Norco 10-325 # pills per month: 120 Last UDS date: 12-20-23 Opioid Treatment Agreement signed (Y/N): 12-10-18 Opioid Treatment Agreement last reviewed with patient:  04-13-24 [facility] reviewed this encounter (include red flags): Yes Meds were refilled.  Garnette Olmsted, MD  Discussed the following assessment and plan:  No diagnosis found.     I discussed the assessment and treatment plan with the patient. The patient was provided an opportunity to ask questions and all were answered. The patient agreed with the plan and demonstrated an understanding of the instructions.   The patient was advised to call back or seek an in-person evaluation if the symptoms worsen or if the condition fails to improve as anticipated.      Review of Systems     Objective:   Physical Exam        Assessment & Plan:

## 2024-04-13 NOTE — Telephone Encounter (Signed)
 Received a call from [employer] about some papers that they sent to Dr. Harden. Spoke with [place] about this and was told to let them know that we have the papers and Dr. Harden is out until Thursday. I relayed the message to the lady on the phone and she stated she would let the patient know.

## 2024-04-23 DIAGNOSIS — Z1211 Encounter for screening for malignant neoplasm of colon: Secondary | ICD-10-CM | POA: Diagnosis not present

## 2024-04-28 LAB — COLOGUARD: COLOGUARD: POSITIVE — AB

## 2024-04-29 ENCOUNTER — Ambulatory Visit: Payer: Self-pay | Admitting: Family Medicine

## 2024-05-02 ENCOUNTER — Encounter: Payer: Self-pay | Admitting: Family Medicine

## 2024-05-04 MED ORDER — DEXCOM G7 SENSOR MISC: 1.0000 | 11 refills | Status: AC

## 2024-05-04 MED ORDER — DEXCOM G7 RECEIVER DEVI: 1.0000 | 2 refills | Status: AC | PRN

## 2024-05-04 NOTE — Telephone Encounter (Signed)
 I sent in RX for the Dexcom sensors and receiver

## 2024-05-13 DIAGNOSIS — E039 Hypothyroidism, unspecified: Secondary | ICD-10-CM | POA: Diagnosis not present

## 2024-05-13 DIAGNOSIS — I129 Hypertensive chronic kidney disease with stage 1 through stage 4 chronic kidney disease, or unspecified chronic kidney disease: Secondary | ICD-10-CM | POA: Diagnosis not present

## 2024-05-13 DIAGNOSIS — K219 Gastro-esophageal reflux disease without esophagitis: Secondary | ICD-10-CM | POA: Diagnosis not present

## 2024-05-13 DIAGNOSIS — N189 Chronic kidney disease, unspecified: Secondary | ICD-10-CM | POA: Diagnosis not present

## 2024-05-13 DIAGNOSIS — I2510 Atherosclerotic heart disease of native coronary artery without angina pectoris: Secondary | ICD-10-CM | POA: Diagnosis not present

## 2024-05-13 DIAGNOSIS — Z1211 Encounter for screening for malignant neoplasm of colon: Secondary | ICD-10-CM | POA: Diagnosis not present

## 2024-05-13 DIAGNOSIS — Z79890 Hormone replacement therapy (postmenopausal): Secondary | ICD-10-CM | POA: Diagnosis not present

## 2024-05-13 DIAGNOSIS — K5730 Diverticulosis of large intestine without perforation or abscess without bleeding: Secondary | ICD-10-CM | POA: Diagnosis not present

## 2024-05-13 DIAGNOSIS — Z7984 Long term (current) use of oral hypoglycemic drugs: Secondary | ICD-10-CM | POA: Diagnosis not present

## 2024-05-13 DIAGNOSIS — E1122 Type 2 diabetes mellitus with diabetic chronic kidney disease: Secondary | ICD-10-CM | POA: Diagnosis not present

## 2024-05-13 DIAGNOSIS — K648 Other hemorrhoids: Secondary | ICD-10-CM | POA: Diagnosis not present

## 2024-05-13 DIAGNOSIS — R195 Other fecal abnormalities: Secondary | ICD-10-CM | POA: Diagnosis not present

## 2024-05-13 DIAGNOSIS — F17220 Nicotine dependence, chewing tobacco, uncomplicated: Secondary | ICD-10-CM | POA: Diagnosis not present

## 2024-05-13 DIAGNOSIS — E785 Hyperlipidemia, unspecified: Secondary | ICD-10-CM | POA: Diagnosis not present

## 2024-05-23 ENCOUNTER — Other Ambulatory Visit: Payer: Self-pay | Admitting: Family Medicine

## 2024-05-23 ENCOUNTER — Other Ambulatory Visit: Payer: Self-pay | Admitting: Adult Health

## 2024-05-27 NOTE — Telephone Encounter (Signed)
 Which dose of Metformin  should pt be taking. Please advise

## 2024-06-03 DIAGNOSIS — Z89512 Acquired absence of left leg below knee: Secondary | ICD-10-CM | POA: Diagnosis not present

## 2024-06-04 MED ORDER — DOXYCYCLINE HYCLATE 100 MG PO TABS: 100.0000 mg | ORAL_TABLET | Freq: Two times a day (BID) | ORAL | 2 refills | Status: AC

## 2024-06-04 NOTE — Telephone Encounter (Signed)
 I sent in some Doxycycline . No need for an OV unless this gets worse

## 2024-06-05 ENCOUNTER — Other Ambulatory Visit: Payer: Self-pay | Admitting: Family Medicine

## 2024-06-17 ENCOUNTER — Encounter: Payer: Self-pay | Admitting: Family Medicine

## 2024-06-17 ENCOUNTER — Telehealth: Admitting: Family Medicine

## 2024-06-17 DIAGNOSIS — J40 Bronchitis, not specified as acute or chronic: Secondary | ICD-10-CM | POA: Diagnosis not present

## 2024-06-17 MED ORDER — AZITHROMYCIN 250 MG PO TABS: ORAL_TABLET | ORAL | 0 refills | Status: DC

## 2024-06-17 NOTE — Progress Notes (Signed)
 Subjective:    Patient ID: Robert Huang, male    DOB: 1960/10/26, 63 y.o.   MRN: [id number]  HPI Virtual Visit via Video Note  I connected with the patient on 06/17/24 at  1:45 PM EDT by a video enabled telemedicine application and verified that I am speaking with the correct person using two identifiers.  Location patient: home Location provider:work or home office Persons participating in the virtual visit: patient, provider  I discussed the limitations of evaluation and management by telemedicine and the availability of in person appointments. The patient expressed understanding and agreed to proceed.   HPI: Here for 5 days of chest congestion, chest tightness, and coughing up yellow sputum. No fever. He has been taking OTC Tussin, and he has an albuterol  inhaler to use if needed.    ROS: See pertinent positives and negatives per HPI.  Past Medical History:  Diagnosis Date   Anxiety    CAD (coronary artery disease)    sees Dr. Pietro, normal Stress test 07-16-12    Diabetes mellitus    DJD (degenerative joint disease)    Gout    Hyperlipidemia    Hypertension    Hypothyroid    Osteoarthritis    Renal calculus    hx   Umbilical hernia     Past Surgical History:  Procedure Laterality Date   AMPUTATION  07/17/2012   Procedure: AMPUTATION RAY;  Surgeon: Norleen Armor, MD;  Location: [facility] OR;  Service: Orthopedics;  Laterality: Right;  right hallux amputation (1st ray resection )   AMPUTATION Left 03/06/2019   Procedure: Ray Amputation left foot;  Surgeon: Sharl Selinda Dover, MD;  Location: [facility] ORS;  Service: Orthopedics;  Laterality: Left;   AMPUTATION Left 10/27/2021   Procedure: LEFT BELOW KNEE AMPUTATION;  Surgeon: Harden Jerona GAILS, MD;  Location: [facility] OR;  Service: Orthopedics;  Laterality: Left;   CORONARY ANGIOPLASTY WITH STENT PLACEMENT     CORONARY LITHOTRIPSY N/A 05/28/2023   Procedure: CORONARY LITHOTRIPSY;  Surgeon: Swaziland, Peter M, MD;  Location: [facility]  INVASIVE CV LAB;  Service: Cardiovascular;  Laterality: N/A;   CORONARY STENT INTERVENTION N/A 05/28/2023   Procedure: CORONARY STENT INTERVENTION;  Surgeon: Swaziland, Peter M, MD;  Location: [facility] INVASIVE CV LAB;  Service: Cardiovascular;  Laterality: N/A;   CORONARY ULTRASOUND/IVUS N/A 12/16/2023   Procedure: Coronary Ultrasound/IVUS;  Surgeon: Anner Alm ORN, MD;  Location: [facility] INVASIVE CV LAB;  Service: Cardiovascular;  Laterality: N/A;   FINGER SURGERY     left index finger   I & D EXTREMITY  05/19/2012   Procedure: IRRIGATION AND DEBRIDEMENT EXTREMITY;  Surgeon: Franky CHRISTELLA Pointer, MD;  Location: [facility] OR;  Service: Orthopedics;  Laterality: Right;  Right Great toe   I & D EXTREMITY Left 10/25/2021   Procedure: LEFT FOOT DEDRIDEMENT;  Surgeon: Harden Jerona GAILS, MD;  Location: [facility] OR;  Service: Orthopedics;  Laterality: Left;   I & D Toe  05/19/2012   rt great toe   LEFT HEART CATH AND CORONARY ANGIOGRAPHY N/A 05/28/2023   Procedure: LEFT HEART CATH AND CORONARY ANGIOGRAPHY;  Surgeon: Swaziland, Peter M, MD;  Location: [facility] INVASIVE CV LAB;  Service: Cardiovascular;  Laterality: N/A;   LEFT HEART CATH AND CORONARY ANGIOGRAPHY N/A 12/16/2023   Procedure: LEFT HEART CATH AND CORONARY ANGIOGRAPHY;  Surgeon: Anner Alm ORN, MD;  Location: [facility] INVASIVE CV LAB;  Service: Cardiovascular;  Laterality: N/A;   TONSILLECTOMY     TRANSMETATARSAL AMPUTATION Left 12/10/2020   Procedure: TRANSMETATARSAL AMPUTATION  LEFT;  Surgeon: Harden Jerona GAILS, MD;  Location: [facility] OR;  Service: Orthopedics;  Laterality: Left;    Family History  Problem Relation Age of Onset   Heart attack Mother    Arthritis Other    Coronary artery disease Other    Diabetes Other    Hypertension Other    Prostate cancer Other    Stroke Other      Current Outpatient Medications:    ALPRAZolam  (XANAX ) 1 MG tablet, Take 1 tablet (1 mg total) by mouth 3 (three) times daily as needed for anxiety., Disp: 270 tablet, Rfl: 1   amLODipine  (NORVASC )  10 MG tablet, TAKE 1 TABLET BY MOUTH EVERY DAY (Patient not taking: Reported on 04/13/2024), Disp: 90 tablet, Rfl: 1   aspirin  81 MG chewable tablet, Chew 1 tablet (81 mg total) by mouth daily., Disp: 90 tablet, Rfl: 2   cloNIDine  (CATAPRES ) 0.3 MG tablet, TAKE 1 TABLET BY MOUTH TWICE A DAY (Patient taking differently: Take 0.3 mg by mouth at bedtime.), Disp: 180 tablet, Rfl: 1   clopidogrel  (PLAVIX ) 75 MG tablet, TAKE 1 TABLET BY MOUTH EVERY DAY, Disp: 90 tablet, Rfl: 2   colchicine  0.6 MG tablet, TAKE 1 TABLET (0.6 MG TOTAL) BY MOUTH EVERY 6 (SIX) HOURS AS NEEDED (GOUT)., Disp: 60 tablet, Rfl: 11   Continuous Glucose Receiver (DEXCOM G7 RECEIVER) DEVI, 1 Application by Does not apply route as needed., Disp: 1 each, Rfl: 2   Continuous Glucose Sensor (DEXCOM G7 SENSOR) MISC, 1 Application by Does not apply route every 14 (fourteen) days., Disp: 2 each, Rfl: 11   cyanocobalamin  (VITAMIN B12) 1000 MCG/ML injection, INJECT 1 ML INTO THE MUSCLE ONCE A WEEK, Disp: 12 mL, Rfl: 0   doxycycline  (VIBRA -TABS) 100 MG tablet, Take 1 tablet (100 mg total) by mouth 2 (two) times daily., Disp: 20 tablet, Rfl: 2   ezetimibe  (ZETIA ) 10 MG tablet, TAKE 1 TABLET BY MOUTH EVERY DAY, Disp: 90 tablet, Rfl: 1   fenofibrate  160 MG tablet, TAKE 1 TABLET BY MOUTH EVERY DAY, Disp: 90 tablet, Rfl: 1   ferrous sulfate  325 (65 FE) MG EC tablet, Take 1 tablet (325 mg total) by mouth 2 (two) times daily. (Patient taking differently: Take 325 mg by mouth daily with breakfast.), Disp: 60 tablet, Rfl: 0   fluticasone  (FLONASE ) 50 MCG/ACT nasal spray, SPRAY 2 SPRAYS INTO EACH NOSTRIL EVERY DAY, Disp: 48 mL, Rfl: 3   glipiZIDE  (GLUCOTROL ) 5 MG tablet, TAKE 1 TABLET BY MOUTH TWICE A DAY (Patient taking differently: Take 5 mg by mouth daily as needed (If blood gluose is over 200/ take a bite of pill).), Disp: 180 tablet, Rfl: 1   hydrALAZINE  (APRESOLINE ) 10 MG tablet, TAKE 1 TABLET (10 MG TOTAL) BY MOUTH IN THE MORNING AND AT BEDTIME, Disp:  180 tablet, Rfl: 3   HYDROcodone -acetaminophen  (NORCO) 10-325 MG tablet, Take 1 tablet by mouth every 6 (six) hours as needed for moderate pain (pain score 4-6)., Disp: 120 tablet, Rfl: 0   HYDROcodone -acetaminophen  (NORCO) 10-325 MG tablet, Take 1 tablet by mouth every 6 (six) hours as needed for moderate pain (pain score 4-6)., Disp: 120 tablet, Rfl: 0   HYDROcodone -acetaminophen  (NORCO) 10-325 MG tablet, Take 1 tablet by mouth every 6 (six) hours as needed for moderate pain (pain score 4-6)., Disp: 120 tablet, Rfl: 0   indomethacin  (INDOCIN  SR) 75 MG CR capsule, TAKE 1 CAPSULE BY MOUTH TWICE A DAY WITH A MEAL, Disp: 60 capsule, Rfl: 5   isosorbide  mononitrate (  IMDUR ) 30 MG 24 hr tablet, Take 1 tablet (30 mg total) by mouth daily., Disp: 90 tablet, Rfl: 3   levothyroxine  (SYNTHROID ) 112 MCG tablet, Take 1 tablet (112 mcg total) by mouth daily., Disp: 90 tablet, Rfl: 3   lisinopril -hydrochlorothiazide  (ZESTORETIC ) 20-12.5 MG tablet, TAKE 1 TABLET TWICE TIMES DAILY AT 10AM AND 5PM, Disp: 180 tablet, Rfl: 1   metFORMIN  (GLUCOPHAGE ) 500 MG tablet, TAKE 2 TABLETS (1,000 MG TOTAL) BY MOUTH 2 (TWO) TIMES DAILY WITH A MEAL., Disp: 360 tablet, Rfl: 3   metoprolol  tartrate (LOPRESSOR ) 25 MG tablet, TAKE 1 TABLET BY MOUTH TWICE A DAY, Disp: 180 tablet, Rfl: 1   mupirocin  ointment (BACTROBAN ) 2 %, APPLY TO AFFECTED AREA TWICE A DAY, Disp: 22 g, Rfl: 2   nitroGLYCERIN  (NITROSTAT ) 0.4 MG SL tablet, Place 1 tablet (0.4 mg total) under the tongue every 5 (five) minutes as needed for chest pain. For chest pain., Disp: 25 tablet, Rfl: 5   omeprazole  (PRILOSEC) 40 MG capsule, TAKE 1 CAPSULE (40 MG TOTAL) BY MOUTH DAILY., Disp: 90 capsule, Rfl: 1   ONETOUCH ULTRA test strip, 1 EACH BY OTHER ROUTE DAILY. USE AS INSTRUCTED, Disp: 100 strip, Rfl: 1   OVER THE COUNTER MEDICATION, Apply 1 patch topically as needed (Energy and Nutrition). Nutrition patches, Disp: , Rfl:    pantoprazole  (PROTONIX ) 40 MG tablet, TAKE 1 TABLET  BY MOUTH EVERY DAY, Disp: 30 tablet, Rfl: 9   rosuvastatin  (CRESTOR ) 40 MG tablet, Take 1 tablet (40 mg total) by mouth daily., Disp: 90 tablet, Rfl: 3   Syringe/Needle, Disp, (SYRINGE 3CC/25GX1) 25G X 1 3 ML MISC, 1 application by Does not apply route once a week., Disp: 50 each, Rfl: 5   tamsulosin  (FLOMAX ) 0.4 MG CAPS capsule, Take 1 capsule (0.4 mg total) by mouth daily., Disp: 90 capsule, Rfl: 3   zolpidem  (AMBIEN ) 10 MG tablet, TAKE 1 TABLET (10 MG TOTAL) BY MOUTH AT BEDTIME AS NEEDED FOR SLEEP, Disp: 30 tablet, Rfl: 5  EXAM:  VITALS per patient if applicable:  GENERAL: alert, oriented, appears well and in no acute distress  HEENT: atraumatic, conjunttiva clear, no obvious abnormalities on inspection of external nose and ears  NECK: normal movements of the head and neck  LUNGS: on inspection no signs of respiratory distress, breathing rate appears normal, no obvious gross SOB, gasping or wheezing  CV: no obvious cyanosis  MS: moves all visible extremities without noticeable abnormality  PSYCH/NEURO: pleasant and cooperative, no obvious depression or anxiety, speech and thought processing grossly intact  ASSESSMENT AND PLAN: Bronchitis, treat with a Zpack. Recheck as needed.  Garnette Olmsted, MD  Discussed the following assessment and plan:  No diagnosis found.     I discussed the assessment and treatment plan with the patient. The patient was provided an opportunity to ask questions and all were answered. The patient agreed with the plan and demonstrated an understanding of the instructions.   The patient was advised to call back or seek an in-person evaluation if the symptoms worsen or if the condition fails to improve as anticipated.      Review of Systems     Objective:   Physical Exam        Assessment & Plan:

## 2024-06-29 ENCOUNTER — Encounter: Payer: Self-pay | Admitting: Radiology

## 2024-07-14 ENCOUNTER — Telehealth (INDEPENDENT_AMBULATORY_CARE_PROVIDER_SITE_OTHER): Admitting: Family Medicine

## 2024-07-14 ENCOUNTER — Encounter: Payer: Self-pay | Admitting: Family Medicine

## 2024-07-14 VITALS — BP 121/74

## 2024-07-14 DIAGNOSIS — G546 Phantom limb syndrome with pain: Secondary | ICD-10-CM

## 2024-07-14 DIAGNOSIS — J019 Acute sinusitis, unspecified: Secondary | ICD-10-CM

## 2024-07-14 DIAGNOSIS — G8929 Other chronic pain: Secondary | ICD-10-CM

## 2024-07-14 MED ORDER — HYDROCODONE-ACETAMINOPHEN 10-325 MG PO TABS: 1.0000 | ORAL_TABLET | Freq: Four times a day (QID) | ORAL | 0 refills | Status: AC | PRN

## 2024-07-14 MED ORDER — AMOXICILLIN-POT CLAVULANATE 875-125 MG PO TABS: 1.0000 | ORAL_TABLET | Freq: Two times a day (BID) | ORAL | 0 refills | Status: DC

## 2024-07-14 NOTE — Progress Notes (Signed)
 Subjective:    Patient ID: Robert Huang, male    DOB: March 12, 1961, 63 y.o.   MRN: [id number]  HPI Virtual Visit via Video Note  I connected with the patient on 07/14/24 at  8:45 AM EST by a video enabled telemedicine application and verified that I am speaking with the correct person using two identifiers.  Location patient: home Location provider:work or home office Persons participating in the virtual visit: patient, provider  I discussed the limitations of evaluation and management by telemedicine and the availability of in person appointments. The patient expressed understanding and agreed to proceed.   HPI: Here for pain management. This has been going well. He also mentions one week of sinus congestion and right ear pain. No fever or cough.    ROS: See pertinent positives and negatives per HPI.  Past Medical History:  Diagnosis Date   Anxiety    CAD (coronary artery disease)    sees Dr. Pietro, normal Stress test 07-16-12    Diabetes mellitus    DJD (degenerative joint disease)    Gout    Hyperlipidemia    Hypertension    Hypothyroid    Osteoarthritis    Renal calculus    hx   Umbilical hernia     Past Surgical History:  Procedure Laterality Date   AMPUTATION  07/17/2012   Procedure: AMPUTATION RAY;  Surgeon: Norleen Armor, MD;  Location: [facility] OR;  Service: Orthopedics;  Laterality: Right;  right hallux amputation (1st ray resection )   AMPUTATION Left 03/06/2019   Procedure: Ray Amputation left foot;  Surgeon: Sharl Selinda Dover, MD;  Location: [facility] ORS;  Service: Orthopedics;  Laterality: Left;   AMPUTATION Left 10/27/2021   Procedure: LEFT BELOW KNEE AMPUTATION;  Surgeon: Harden Jerona GAILS, MD;  Location: [facility] OR;  Service: Orthopedics;  Laterality: Left;   CORONARY ANGIOPLASTY WITH STENT PLACEMENT     CORONARY LITHOTRIPSY N/A 05/28/2023   Procedure: CORONARY LITHOTRIPSY;  Surgeon: Jordan, Peter M, MD;  Location: [facility] INVASIVE CV LAB;  Service:  Cardiovascular;  Laterality: N/A;   CORONARY STENT INTERVENTION N/A 05/28/2023   Procedure: CORONARY STENT INTERVENTION;  Surgeon: Jordan, Peter M, MD;  Location: [facility] INVASIVE CV LAB;  Service: Cardiovascular;  Laterality: N/A;   CORONARY ULTRASOUND/IVUS N/A 12/16/2023   Procedure: Coronary Ultrasound/IVUS;  Surgeon: Anner Alm ORN, MD;  Location: [facility] INVASIVE CV LAB;  Service: Cardiovascular;  Laterality: N/A;   FINGER SURGERY     left index finger   I & D EXTREMITY  05/19/2012   Procedure: IRRIGATION AND DEBRIDEMENT EXTREMITY;  Surgeon: Franky CHRISTELLA Pointer, MD;  Location: [facility] OR;  Service: Orthopedics;  Laterality: Right;  Right Great toe   I & D EXTREMITY Left 10/25/2021   Procedure: LEFT FOOT DEDRIDEMENT;  Surgeon: Harden Jerona GAILS, MD;  Location: [facility] OR;  Service: Orthopedics;  Laterality: Left;   I & D Toe  05/19/2012   rt great toe   LEFT HEART CATH AND CORONARY ANGIOGRAPHY N/A 05/28/2023   Procedure: LEFT HEART CATH AND CORONARY ANGIOGRAPHY;  Surgeon: Jordan, Peter M, MD;  Location: [facility] INVASIVE CV LAB;  Service: Cardiovascular;  Laterality: N/A;   LEFT HEART CATH AND CORONARY ANGIOGRAPHY N/A 12/16/2023   Procedure: LEFT HEART CATH AND CORONARY ANGIOGRAPHY;  Surgeon: Anner Alm ORN, MD;  Location: [facility] INVASIVE CV LAB;  Service: Cardiovascular;  Laterality: N/A;   TONSILLECTOMY     TRANSMETATARSAL AMPUTATION Left 12/10/2020   Procedure: TRANSMETATARSAL AMPUTATION LEFT;  Surgeon: Harden Jerona GAILS, MD;  Location: [facility] OR;  Service: Orthopedics;  Laterality: Left;    Family History  Problem Relation Age of Onset   Heart attack Mother    Arthritis Other    Coronary artery disease Other    Diabetes Other    Hypertension Other    Prostate cancer Other    Stroke Other      Current Outpatient Medications:    ALPRAZolam  (XANAX ) 1 MG tablet, Take 1 tablet (1 mg total) by mouth 3 (three) times daily as needed for anxiety., Disp: 270 tablet, Rfl: 1   aspirin  81 MG chewable tablet, Chew 1 tablet (81  mg total) by mouth daily., Disp: 90 tablet, Rfl: 2   cloNIDine  (CATAPRES ) 0.3 MG tablet, TAKE 1 TABLET BY MOUTH TWICE A DAY (Patient taking differently: Take 0.3 mg by mouth at bedtime.), Disp: 180 tablet, Rfl: 1   clopidogrel  (PLAVIX ) 75 MG tablet, TAKE 1 TABLET BY MOUTH EVERY DAY, Disp: 90 tablet, Rfl: 2   Continuous Glucose Receiver (DEXCOM G7 RECEIVER) DEVI, 1 Application by Does not apply route as needed., Disp: 1 each, Rfl: 2   Continuous Glucose Sensor (DEXCOM G7 SENSOR) MISC, 1 Application by Does not apply route every 14 (fourteen) days., Disp: 2 each, Rfl: 11   cyanocobalamin  (VITAMIN B12) 1000 MCG/ML injection, INJECT 1 ML INTO THE MUSCLE ONCE A WEEK, Disp: 12 mL, Rfl: 0   doxycycline  (VIBRA -TABS) 100 MG tablet, Take 1 tablet (100 mg total) by mouth 2 (two) times daily., Disp: 20 tablet, Rfl: 2   ezetimibe  (ZETIA ) 10 MG tablet, TAKE 1 TABLET BY MOUTH EVERY DAY, Disp: 90 tablet, Rfl: 1   fenofibrate  160 MG tablet, TAKE 1 TABLET BY MOUTH EVERY DAY, Disp: 90 tablet, Rfl: 1   ferrous sulfate  325 (65 FE) MG EC tablet, Take 1 tablet (325 mg total) by mouth 2 (two) times daily. (Patient taking differently: Take 325 mg by mouth daily with breakfast.), Disp: 60 tablet, Rfl: 0   fluticasone  (FLONASE ) 50 MCG/ACT nasal spray, SPRAY 2 SPRAYS INTO EACH NOSTRIL EVERY DAY, Disp: 48 mL, Rfl: 3   glipiZIDE  (GLUCOTROL ) 5 MG tablet, TAKE 1 TABLET BY MOUTH TWICE A DAY (Patient taking differently: Take 5 mg by mouth daily as needed (If blood gluose is over 200/ take a bite of pill).), Disp: 180 tablet, Rfl: 1   hydrALAZINE  (APRESOLINE ) 10 MG tablet, TAKE 1 TABLET (10 MG TOTAL) BY MOUTH IN THE MORNING AND AT BEDTIME, Disp: 180 tablet, Rfl: 3   HYDROcodone -acetaminophen  (NORCO) 10-325 MG tablet, Take 1 tablet by mouth every 6 (six) hours as needed for moderate pain (pain score 4-6)., Disp: 120 tablet, Rfl: 0   HYDROcodone -acetaminophen  (NORCO) 10-325 MG tablet, Take 1 tablet by mouth every 6 (six) hours as needed  for moderate pain (pain score 4-6)., Disp: 120 tablet, Rfl: 0   HYDROcodone -acetaminophen  (NORCO) 10-325 MG tablet, Take 1 tablet by mouth every 6 (six) hours as needed for moderate pain (pain score 4-6)., Disp: 120 tablet, Rfl: 0   indomethacin  (INDOCIN  SR) 75 MG CR capsule, TAKE 1 CAPSULE BY MOUTH TWICE A DAY WITH A MEAL, Disp: 60 capsule, Rfl: 5   isosorbide  mononitrate (IMDUR ) 30 MG 24 hr tablet, Take 1 tablet (30 mg total) by mouth daily., Disp: 90 tablet, Rfl: 3   levothyroxine  (SYNTHROID ) 112 MCG tablet, Take 1 tablet (112 mcg total) by mouth daily., Disp: 90 tablet, Rfl: 3   lisinopril -hydrochlorothiazide  (ZESTORETIC ) 20-12.5 MG tablet, TAKE 1 TABLET TWICE TIMES DAILY AT 10AM AND 5PM, Disp:  180 tablet, Rfl: 1   metFORMIN  (GLUCOPHAGE ) 500 MG tablet, TAKE 2 TABLETS (1,000 MG TOTAL) BY MOUTH 2 (TWO) TIMES DAILY WITH A MEAL., Disp: 360 tablet, Rfl: 3   metoprolol  tartrate (LOPRESSOR ) 25 MG tablet, TAKE 1 TABLET BY MOUTH TWICE A DAY (Patient taking differently: Take 25 mg by mouth daily. Taking once daily), Disp: 180 tablet, Rfl: 1   mupirocin  ointment (BACTROBAN ) 2 %, APPLY TO AFFECTED AREA TWICE A DAY, Disp: 22 g, Rfl: 2   nitroGLYCERIN  (NITROSTAT ) 0.4 MG SL tablet, Place 1 tablet (0.4 mg total) under the tongue every 5 (five) minutes as needed for chest pain. For chest pain., Disp: 25 tablet, Rfl: 5   omeprazole  (PRILOSEC) 40 MG capsule, TAKE 1 CAPSULE (40 MG TOTAL) BY MOUTH DAILY., Disp: 90 capsule, Rfl: 1   ONETOUCH ULTRA test strip, 1 EACH BY OTHER ROUTE DAILY. USE AS INSTRUCTED, Disp: 100 strip, Rfl: 1   OVER THE COUNTER MEDICATION, Apply 1 patch topically as needed (Energy and Nutrition). Nutrition patches, Disp: , Rfl:    pantoprazole  (PROTONIX ) 40 MG tablet, TAKE 1 TABLET BY MOUTH EVERY DAY, Disp: 30 tablet, Rfl: 9   rosuvastatin  (CRESTOR ) 40 MG tablet, Take 1 tablet (40 mg total) by mouth daily., Disp: 90 tablet, Rfl: 3   Syringe/Needle, Disp, (SYRINGE 3CC/25GX1) 25G X 1 3 ML MISC, 1  application by Does not apply route once a week., Disp: 50 each, Rfl: 5   tamsulosin  (FLOMAX ) 0.4 MG CAPS capsule, Take 1 capsule (0.4 mg total) by mouth daily., Disp: 90 capsule, Rfl: 3   zolpidem  (AMBIEN ) 10 MG tablet, TAKE 1 TABLET (10 MG TOTAL) BY MOUTH AT BEDTIME AS NEEDED FOR SLEEP, Disp: 30 tablet, Rfl: 5   colchicine  0.6 MG tablet, TAKE 1 TABLET (0.6 MG TOTAL) BY MOUTH EVERY 6 (SIX) HOURS AS NEEDED (GOUT). (Patient not taking: Reported on 07/14/2024), Disp: 60 tablet, Rfl: 11  EXAM:  VITALS per patient if applicable:  GENERAL: alert, oriented, appears well and in no acute distress  HEENT: atraumatic, conjunttiva clear, no obvious abnormalities on inspection of external nose and ears  NECK: normal movements of the head and neck  LUNGS: on inspection no signs of respiratory distress, breathing rate appears normal, no obvious gross SOB, gasping or wheezing  CV: no obvious cyanosis  MS: moves all visible extremities without noticeable abnormality  PSYCH/NEURO: pleasant and cooperative, no obvious depression or anxiety, speech and thought processing grossly intact  ASSESSMENT AND PLAN: Pain management. Indication for chronic opioid: phantom limb pain Medication and dose: Norco 10-325 # pills per month: 120 Last UDS date: 12-20-23 Opioid Treatment Agreement signed (Y/N): 12-10-18 Opioid Treatment Agreement last reviewed with patient:  07-14-24 [facility] reviewed this encounter (include red flags): Yes Meds were refilled. We also sent in 10 days of Augmentin  875 for the sinusitis. Garnette Olmsted, MD  Discussed the following assessment and plan:  No diagnosis found.     I discussed the assessment and treatment plan with the patient. The patient was provided an opportunity to ask questions and all were answered. The patient agreed with the plan and demonstrated an understanding of the instructions.   The patient was advised to call back or seek an in-person evaluation if the  symptoms worsen or if the condition fails to improve as anticipated.      Review of Systems     Objective:   Physical Exam        Assessment & Plan:

## 2024-07-22 ENCOUNTER — Other Ambulatory Visit: Payer: Self-pay | Admitting: Family Medicine

## 2024-08-07 ENCOUNTER — Other Ambulatory Visit: Payer: Self-pay | Admitting: Family Medicine

## 2024-08-14 ENCOUNTER — Other Ambulatory Visit: Payer: Self-pay | Admitting: Family Medicine

## 2024-08-18 ENCOUNTER — Telehealth: Payer: Self-pay

## 2024-08-18 ENCOUNTER — Other Ambulatory Visit: Payer: Self-pay

## 2024-08-18 DIAGNOSIS — E785 Hyperlipidemia, unspecified: Secondary | ICD-10-CM

## 2024-08-18 DIAGNOSIS — I10 Essential (primary) hypertension: Secondary | ICD-10-CM

## 2024-08-18 DIAGNOSIS — E7800 Pure hypercholesterolemia, unspecified: Secondary | ICD-10-CM

## 2024-08-18 MED ORDER — METOPROLOL TARTRATE 25 MG PO TABS: 25.0000 mg | ORAL_TABLET | Freq: Two times a day (BID) | ORAL | 0 refills | Status: AC

## 2024-08-18 MED ORDER — ISOSORBIDE MONONITRATE ER 30 MG PO TB24: 30.0000 mg | ORAL_TABLET | Freq: Every day | ORAL | 0 refills | Status: DC

## 2024-08-18 MED ORDER — LEVOTHYROXINE SODIUM 112 MCG PO TABS: 112.0000 ug | ORAL_TABLET | Freq: Every day | ORAL | 0 refills | Status: AC

## 2024-08-18 MED ORDER — LISINOPRIL-HYDROCHLOROTHIAZIDE 20-12.5 MG PO TABS: 1.0000 | ORAL_TABLET | Freq: Two times a day (BID) | ORAL | 0 refills | Status: DC

## 2024-08-18 MED ORDER — INDOMETHACIN ER 75 MG PO CPCR: 75.0000 mg | ORAL_CAPSULE | Freq: Two times a day (BID) | ORAL | 0 refills | Status: DC

## 2024-08-18 MED ORDER — METFORMIN HCL 500 MG PO TABS: 1000.0000 mg | ORAL_TABLET | Freq: Two times a day (BID) | ORAL | 0 refills | Status: AC

## 2024-08-18 MED ORDER — EZETIMIBE 10 MG PO TABS: 10.0000 mg | ORAL_TABLET | Freq: Every day | ORAL | 0 refills | Status: AC

## 2024-08-18 MED ORDER — ROSUVASTATIN CALCIUM 40 MG PO TABS: 40.0000 mg | ORAL_TABLET | Freq: Every day | ORAL | 0 refills | Status: AC

## 2024-08-18 NOTE — Telephone Encounter (Signed)
 Copied from CRM #[id number]. Topic: Clinical - Prescription Issue >> Aug 18, 2024  3:44 PM Nessti S wrote: Reason for CRM: pt called because he lost his meds and he wanted to see if pcp will refill because he is 18 hours away. He doesn't know exactly which ones but he does know he will need his blood pressure meds. He would like a call back concerning this issue.  [phone] >> Aug 18, 2024  3:47 PM Nessti S wrote: He also mentioned he is completely out of meds

## 2024-08-18 NOTE — Telephone Encounter (Signed)
 Spoke with pt per Dr Johnny 10 days worth prescriptions sent to [facility] in [place] .

## 2024-08-20 ENCOUNTER — Other Ambulatory Visit: Payer: Self-pay | Admitting: Family Medicine

## 2024-08-26 ENCOUNTER — Other Ambulatory Visit: Payer: Self-pay | Admitting: Family Medicine

## 2024-08-26 DIAGNOSIS — I10 Essential (primary) hypertension: Secondary | ICD-10-CM

## 2024-09-03 ENCOUNTER — Other Ambulatory Visit: Payer: Self-pay | Admitting: Family Medicine

## 2024-09-05 ENCOUNTER — Other Ambulatory Visit: Payer: Self-pay | Admitting: Family Medicine

## 2024-09-05 DIAGNOSIS — I10 Essential (primary) hypertension: Secondary | ICD-10-CM

## 2024-09-09 ENCOUNTER — Other Ambulatory Visit: Payer: Self-pay | Admitting: Family Medicine

## 2024-09-15 ENCOUNTER — Ambulatory Visit: Admitting: Orthopedic Surgery

## 2024-09-15 ENCOUNTER — Ambulatory Visit: Admitting: Family Medicine

## 2024-09-15 ENCOUNTER — Encounter: Payer: Self-pay | Admitting: Family Medicine

## 2024-09-15 ENCOUNTER — Encounter: Payer: Self-pay | Admitting: Orthopedic Surgery

## 2024-09-15 VITALS — BP 110/60 | HR 54 | Temp 98.3°F | Wt 170.6 lb

## 2024-09-15 DIAGNOSIS — Z89512 Acquired absence of left leg below knee: Secondary | ICD-10-CM

## 2024-09-15 DIAGNOSIS — R5381 Other malaise: Secondary | ICD-10-CM

## 2024-09-15 DIAGNOSIS — Z7984 Long term (current) use of oral hypoglycemic drugs: Secondary | ICD-10-CM

## 2024-09-15 DIAGNOSIS — E1122 Type 2 diabetes mellitus with diabetic chronic kidney disease: Secondary | ICD-10-CM | POA: Diagnosis not present

## 2024-09-15 DIAGNOSIS — N1832 Chronic kidney disease, stage 3b: Secondary | ICD-10-CM | POA: Diagnosis not present

## 2024-09-15 DIAGNOSIS — M1000 Idiopathic gout, unspecified site: Secondary | ICD-10-CM

## 2024-09-15 DIAGNOSIS — S88112A Complete traumatic amputation at level between knee and ankle, left lower leg, initial encounter: Secondary | ICD-10-CM

## 2024-09-15 DIAGNOSIS — N183 Chronic kidney disease, stage 3 unspecified: Secondary | ICD-10-CM | POA: Insufficient documentation

## 2024-09-15 MED ORDER — ISOSORBIDE MONONITRATE ER 30 MG PO TB24: 30.0000 mg | ORAL_TABLET | Freq: Every day | ORAL | 3 refills | Status: AC

## 2024-09-15 MED ORDER — AMOXICILLIN-POT CLAVULANATE 875-125 MG PO TABS: 1.0000 | ORAL_TABLET | Freq: Two times a day (BID) | ORAL | 0 refills | Status: AC

## 2024-09-15 NOTE — Progress Notes (Signed)
" ° °  Subjective:    Patient ID: Robert Huang, male    DOB: 05-15-1961, 64 y.o.   MRN: [id number]  HPI Here for several issues. First he is asking to take a few months off work to improve his health. His problems, especially the type 2 diabetes and the CKD, cause him to be fatigued all the time. 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 He wants to have the time to get adequate rest and to eat a healthy diet. His BP has been stable, and his am fasting glucoses have been averaging 90-130. The other issue is a tender knot on hi left 3rd finger that appeared about 2 months ago. He does have gout which flares up from time to time.    Review of Systems  Constitutional:  Positive for fatigue.  Respiratory: Negative.    Cardiovascular: Negative.   Gastrointestinal: Negative.   Genitourinary: Negative.   Musculoskeletal:  Positive for arthralgias.  Psychiatric/Behavioral:  Positive for decreased concentration.        Objective:   Physical Exam Constitutional:      Appearance: He is not ill-appearing.  Cardiovascular:     Rate and Rhythm: Normal rate and regular rhythm.     Pulses: Normal pulses.     Heart sounds: Normal heart sounds.  Pulmonary:     Effort: Pulmonary effort is normal.     Breath sounds: Normal breath sounds.  Musculoskeletal:     Comments: There is a slightly tender nodular lesion on off the PIP joint of the left 3rd finger consistent with a gouty tophus.   Neurological:     Mental Status: He is alert.           Assessment & Plan:  He has chronic fatigue from type 2 diabetes and CKD. We will write him out of work from 09-16-24 until 12-17-24. He will take this to his HR department to begin the short term disability process. 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 I told him this could be surgically removed, but he wants to postpone that for now.  Garnette Olmsted, MD   "

## 2024-09-15 NOTE — Progress Notes (Signed)
 "  Office Visit Note   Patient: Robert Huang Hmpwidujqq           Date of Birth: 1960-12-27           MRN: [id number] Visit Date: 09/15/2024              Requested by: Johnny Garnette LABOR, MD [address] [place],  [place] [address] PCP: Johnny Garnette LABOR, MD  Chief Complaint  Patient presents with   Left Leg - Follow-up      HPI: Discussed the use of AI scribe software for clinical note transcription with the patient, who gave verbal consent to proceed.  History of Present Illness Robert Huang Hmpwidujqq Boas is a 64 year old male who presents for follow-up for several issues.  #1 he is status post left transtibial amputation he has a special weight weight prosthetic leg and is now having difficulty using the leg for activities of daily living due to progressive physical deconditioning.  Patient also is status post ray amputation on the right foot with progressive clawing of the second toe on the right foot.     Assessment & Plan: Visit Diagnoses:  1. Below-knee amputation of left lower extremity, initial encounter (HCC)   2. Physical deconditioning     Plan: Assessment and Plan Assessment & Plan  Assessment: Physical deconditioning with left transtibial amputation and ray amputation on the right with diabetes.  Plan: With patient's progressive deconditioning and difficulty working secondary to generalized weakness I have recommended starting with short-term disability and work on strengthening if short-term disability does not allow him to regain his normal function patient would need permanent disability.      Follow-Up Instructions: No follow-ups on file.   Ortho Exam  Patient is alert, oriented, no adenopathy, well-dressed, normal affect, normal respiratory effort. Physical Exam  On examination patient has a light weight socket for a left transtibial amputation.  The prosthesis fits well.  He has no ulcers.  Examination of the right foot he has a stable first ray  amputation with fixed clawing of the second toe with early pressure injury to the claw toe with potential for ulceration.  Patient has generalized weakness with difficulty with activities daily living difficulty with work.      Imaging: No results found. No images are attached to the encounter.  Labs: Lab Results  Component Value Date   HGBA1C 6.6 (H) 12/20/2023   HGBA1C 6.7 (A) 07/16/2023   HGBA1C 6.8 (H) 03/11/2023   ESRSEDRATE >140 (H) 10/22/2021   ESRSEDRATE 50 (H) 03/02/2019   ESRSEDRATE 63 (H) 07/17/2012   CRP 20.1 (H) 10/22/2021   CRP 2.9 (H) 03/02/2019   CRP 1.3 (H) 07/17/2012   LABURIC 7.2 12/03/2017   LABURIC 7.6 01/11/2017   LABURIC 12.7 (H) 11/05/2014   REPTSTATUS 11/02/2021 FINAL 10/25/2021   GRAMSTAIN  10/25/2021    RARE WBC PRESENT, PREDOMINANTLY MONONUCLEAR RARE GRAM POSITIVE COCCI IN PAIRS    CULT  10/25/2021    FEW PROTEUS MIRABILIS RARE ENTEROCOCCUS FAECALIS RARE BACTEROIDES FRAGILIS BETA LACTAMASE POSITIVE Performed at [facility], [address] [place], [place] [address]    IDOLINA PROTEUS MIRABILIS 10/25/2021   LABORGA ENTEROCOCCUS FAECALIS 10/25/2021     Lab Results  Component Value Date   ALBUMIN 3.4 (L) 12/12/2023   ALBUMIN 3.7 03/11/2023   ALBUMIN 4.1 01/23/2022    Lab Results  Component Value Date   MG 1.4 (L) 12/12/2023   MG 1.6 (L) 10/26/2021  MG 1.5 (L) 10/25/2021   No results found for: VD25OH  No results found for: PREALBUMIN    Latest Ref Rng & Units 12/20/2023    3:43 PM 12/12/2023    4:25 PM 05/21/2023    8:33 AM  CBC EXTENDED  WBC 3.8 - 10.8 Thousand/uL 8.0  6.3  4.1   RBC 4.20 - 5.80 Million/uL 3.43  3.49  3.80   Hemoglobin 13.2 - 17.1 g/dL 89.2  89.0  87.8   HCT 38.5 - 50.0 % 32.4  32.0  37.1   Platelets 140 - 400 Thousand/uL 275  237  241   NEUT# 1,500 - 7,800 cells/uL 6,272        There is no height or weight on file to calculate BMI.  Orders:  No orders of the defined types were placed in  this encounter.  No orders of the defined types were placed in this encounter.    Procedures: No procedures performed  Clinical Data: No additional findings.  ROS:  All other systems negative, except as noted in the HPI. Review of Systems  Objective: Vital Signs: There were no vitals taken for this visit.  Specialty Comments:  No specialty comments available.  PMFS History: Patient Active Problem List   Diagnosis Date Noted   Allergic rhinitis 07/16/2023   Amputation below knee (HCC) 03/29/2023   B12 deficiency 03/11/2023   Phantom limb pain (HCC) 03/07/2022   Abscess of left foot    History of transmetatarsal amputation of left foot (HCC)    Severe protein-calorie malnutrition    CKD (chronic kidney disease) stage 2, GFR 60-89 ml/min 10/22/2021   Hyponatremia 10/22/2021   Chronic foot pain 08/29/2021   Iron deficiency anemia 12/19/2020   Cellulitis of left foot    History of complete ray amputation of fifth toe of left foot    Status post amputation of right great toe    Diabetic foot infection (HCC) 12/08/2020   Diabetic foot ulcers (HCC) 03/02/2019   Osteomyelitis of fourth toe of left foot (HCC) 03/02/2019   Hypokalemia 03/02/2019   Chronic right shoulder pain 12/02/2018   Diabetes mellitus with complication (HCC) 12/03/2017   Right hip pain 12/27/2015   MRSA (methicillin resistant staph aureus) culture positive 12/31/2012   Open displaced fracture of right great toe 05/20/2012   Anxiety state 12/25/2008   Coronary atherosclerosis 12/25/2008   CHEST PAIN 12/25/2008   Hypothyroidism 05/27/2007   Dyslipidemia 05/27/2007   Gout 05/27/2007   Essential hypertension 05/27/2007   Osteoarthritis 05/27/2007   RENAL CALCULUS, HX OF 05/27/2007   Past Medical History:  Diagnosis Date   Anxiety    CAD (coronary artery disease)    sees Dr. Pietro, normal Stress test 07-16-12    Diabetes mellitus    DJD (degenerative joint disease)    Gout    Hyperlipidemia     Hypertension    Hypothyroid    Osteoarthritis    Renal calculus    hx   Umbilical hernia     Family History  Problem Relation Age of Onset   Heart attack Mother    Arthritis Other    Coronary artery disease Other    Diabetes Other    Hypertension Other    Prostate cancer Other    Stroke Other     Past Surgical History:  Procedure Laterality Date   AMPUTATION  07/17/2012   Procedure: AMPUTATION RAY;  Surgeon: Norleen Armor, MD;  Location: [facility] OR;  Service: Orthopedics;  Laterality: Right;  right hallux amputation (1st ray resection )   AMPUTATION Left 03/06/2019   Procedure: Ray Amputation left foot;  Surgeon: Sharl Selinda Dover, MD;  Location: [facility] ORS;  Service: Orthopedics;  Laterality: Left;   AMPUTATION Left 10/27/2021   Procedure: LEFT BELOW KNEE AMPUTATION;  Surgeon: Harden Jerona GAILS, MD;  Location: [facility] OR;  Service: Orthopedics;  Laterality: Left;   CORONARY ANGIOPLASTY WITH STENT PLACEMENT     CORONARY LITHOTRIPSY N/A 05/28/2023   Procedure: CORONARY LITHOTRIPSY;  Surgeon: Jordan, Peter M, MD;  Location: [facility] INVASIVE CV LAB;  Service: Cardiovascular;  Laterality: N/A;   CORONARY STENT INTERVENTION N/A 05/28/2023   Procedure: CORONARY STENT INTERVENTION;  Surgeon: Jordan, Peter M, MD;  Location: [facility] INVASIVE CV LAB;  Service: Cardiovascular;  Laterality: N/A;   CORONARY ULTRASOUND/IVUS N/A 12/16/2023   Procedure: Coronary Ultrasound/IVUS;  Surgeon: Anner Alm ORN, MD;  Location: [facility] INVASIVE CV LAB;  Service: Cardiovascular;  Laterality: N/A;   FINGER SURGERY     left index finger   I & D EXTREMITY  05/19/2012   Procedure: IRRIGATION AND DEBRIDEMENT EXTREMITY;  Surgeon: Franky CHRISTELLA Pointer, MD;  Location: [facility] OR;  Service: Orthopedics;  Laterality: Right;  Right Great toe   I & D EXTREMITY Left 10/25/2021   Procedure: LEFT FOOT DEDRIDEMENT;  Surgeon: Harden Jerona GAILS, MD;  Location: [facility] OR;  Service: Orthopedics;  Laterality: Left;   I & D Toe  05/19/2012   rt great toe   LEFT  HEART CATH AND CORONARY ANGIOGRAPHY N/A 05/28/2023   Procedure: LEFT HEART CATH AND CORONARY ANGIOGRAPHY;  Surgeon: Jordan, Peter M, MD;  Location: [facility] INVASIVE CV LAB;  Service: Cardiovascular;  Laterality: N/A;   LEFT HEART CATH AND CORONARY ANGIOGRAPHY N/A 12/16/2023   Procedure: LEFT HEART CATH AND CORONARY ANGIOGRAPHY;  Surgeon: Anner Alm ORN, MD;  Location: [facility] INVASIVE CV LAB;  Service: Cardiovascular;  Laterality: N/A;   TONSILLECTOMY     TRANSMETATARSAL AMPUTATION Left 12/10/2020   Procedure: TRANSMETATARSAL AMPUTATION LEFT;  Surgeon: Harden Jerona GAILS, MD;  Location: [facility] OR;  Service: Orthopedics;  Laterality: Left;   Social History   Occupational History   Not on file  Tobacco Use   Smoking status: Never   Smokeless tobacco: Current    Types: Chew   Tobacco comments:    Occ    Patient chew occasionally   Vaping Use   Vaping status: Never Used  Substance and Sexual Activity   Alcohol use: Yes    Alcohol/week: 0.0 standard drinks of alcohol    Comment: weekends   Drug use: No   Sexual activity: Yes         "

## 2024-09-17 ENCOUNTER — Telehealth: Payer: Self-pay | Admitting: *Deleted

## 2024-09-17 NOTE — Telephone Encounter (Signed)
 Copied from CRM #[id number]. Topic: Clinical - Request for Lab/Test Order >> Sep 17, 2024  2:49 PM Macario HERO wrote: Reason for CRM: Patient is requesting a full lipid panel.

## 2024-09-18 NOTE — Telephone Encounter (Signed)
 Left detailed message for pt advised to call the office back regarding this message

## 2024-09-23 ENCOUNTER — Ambulatory Visit: Admitting: Family Medicine
